# Patient Record
Sex: Male | Born: 1970 | Race: Black or African American | Hispanic: No | Marital: Single | Smoking: Former smoker
Health system: Southern US, Community
[De-identification: ages and names within clinical notes are randomized; demographics above are authoritative.]

## PROBLEM LIST (undated history)

## (undated) DIAGNOSIS — I38 Endocarditis, valve unspecified: Secondary | ICD-10-CM

## (undated) DIAGNOSIS — I10 Essential (primary) hypertension: Secondary | ICD-10-CM

## (undated) DIAGNOSIS — G8929 Other chronic pain: Secondary | ICD-10-CM

## (undated) DIAGNOSIS — N19 Unspecified kidney failure: Secondary | ICD-10-CM

## (undated) DIAGNOSIS — I639 Cerebral infarction, unspecified: Secondary | ICD-10-CM

## (undated) DIAGNOSIS — S83206A Unspecified tear of unspecified meniscus, current injury, right knee, initial encounter: Secondary | ICD-10-CM

## (undated) DIAGNOSIS — K219 Gastro-esophageal reflux disease without esophagitis: Secondary | ICD-10-CM

## (undated) DIAGNOSIS — Z992 Dependence on renal dialysis: Secondary | ICD-10-CM

## (undated) DIAGNOSIS — M549 Dorsalgia, unspecified: Secondary | ICD-10-CM

## (undated) DIAGNOSIS — F419 Anxiety disorder, unspecified: Secondary | ICD-10-CM

## (undated) DIAGNOSIS — R569 Unspecified convulsions: Secondary | ICD-10-CM

## (undated) DIAGNOSIS — S22009A Unspecified fracture of unspecified thoracic vertebra, initial encounter for closed fracture: Secondary | ICD-10-CM

## (undated) DIAGNOSIS — S83207A Unspecified tear of unspecified meniscus, current injury, left knee, initial encounter: Secondary | ICD-10-CM

## (undated) DIAGNOSIS — G4730 Sleep apnea, unspecified: Secondary | ICD-10-CM

## (undated) HISTORY — DX: Essential (primary) hypertension: I10

## (undated) HISTORY — PX: OTHER SURGICAL HISTORY: SHX169

## (undated) HISTORY — DX: Unspecified fracture of unspecified thoracic vertebra, initial encounter for closed fracture: S22.009A

## (undated) HISTORY — DX: Unspecified convulsions: R56.9

## (undated) HISTORY — DX: Sleep apnea, unspecified: G47.30

## (undated) HISTORY — PX: AV FISTULA REPAIR: SHX563

## (undated) HISTORY — PX: AV FISTULA PLACEMENT: SHX1204

---

## 2006-09-02 ENCOUNTER — Other Ambulatory Visit: Payer: Self-pay

## 2006-09-02 ENCOUNTER — Inpatient Hospital Stay: Payer: Self-pay | Admitting: Internal Medicine

## 2009-04-05 ENCOUNTER — Inpatient Hospital Stay: Payer: Self-pay | Admitting: Internal Medicine

## 2009-04-05 ENCOUNTER — Ambulatory Visit: Payer: Self-pay | Admitting: Cardiovascular Disease

## 2009-05-28 ENCOUNTER — Ambulatory Visit: Payer: Self-pay | Admitting: Vascular Surgery

## 2009-06-04 ENCOUNTER — Ambulatory Visit: Payer: Self-pay | Admitting: Vascular Surgery

## 2009-06-25 ENCOUNTER — Emergency Department: Payer: Self-pay | Admitting: Emergency Medicine

## 2009-10-15 ENCOUNTER — Ambulatory Visit: Payer: Self-pay | Admitting: Internal Medicine

## 2011-01-18 DIAGNOSIS — N186 End stage renal disease: Secondary | ICD-10-CM | POA: Diagnosis not present

## 2011-01-19 DIAGNOSIS — N186 End stage renal disease: Secondary | ICD-10-CM | POA: Diagnosis not present

## 2011-01-19 DIAGNOSIS — D509 Iron deficiency anemia, unspecified: Secondary | ICD-10-CM | POA: Diagnosis not present

## 2011-01-19 DIAGNOSIS — N2581 Secondary hyperparathyroidism of renal origin: Secondary | ICD-10-CM | POA: Diagnosis not present

## 2011-01-19 DIAGNOSIS — Z992 Dependence on renal dialysis: Secondary | ICD-10-CM | POA: Diagnosis not present

## 2011-01-21 DIAGNOSIS — D509 Iron deficiency anemia, unspecified: Secondary | ICD-10-CM | POA: Diagnosis not present

## 2011-01-21 DIAGNOSIS — Z992 Dependence on renal dialysis: Secondary | ICD-10-CM | POA: Diagnosis not present

## 2011-01-21 DIAGNOSIS — N186 End stage renal disease: Secondary | ICD-10-CM | POA: Diagnosis not present

## 2011-01-21 DIAGNOSIS — N2581 Secondary hyperparathyroidism of renal origin: Secondary | ICD-10-CM | POA: Diagnosis not present

## 2011-01-24 DIAGNOSIS — D509 Iron deficiency anemia, unspecified: Secondary | ICD-10-CM | POA: Diagnosis not present

## 2011-01-24 DIAGNOSIS — N186 End stage renal disease: Secondary | ICD-10-CM | POA: Diagnosis not present

## 2011-01-24 DIAGNOSIS — Z992 Dependence on renal dialysis: Secondary | ICD-10-CM | POA: Diagnosis not present

## 2011-01-24 DIAGNOSIS — N2581 Secondary hyperparathyroidism of renal origin: Secondary | ICD-10-CM | POA: Diagnosis not present

## 2011-01-26 DIAGNOSIS — Z992 Dependence on renal dialysis: Secondary | ICD-10-CM | POA: Diagnosis not present

## 2011-01-26 DIAGNOSIS — D509 Iron deficiency anemia, unspecified: Secondary | ICD-10-CM | POA: Diagnosis not present

## 2011-01-26 DIAGNOSIS — N2581 Secondary hyperparathyroidism of renal origin: Secondary | ICD-10-CM | POA: Diagnosis not present

## 2011-01-26 DIAGNOSIS — N186 End stage renal disease: Secondary | ICD-10-CM | POA: Diagnosis not present

## 2011-01-28 DIAGNOSIS — N2581 Secondary hyperparathyroidism of renal origin: Secondary | ICD-10-CM | POA: Diagnosis not present

## 2011-01-28 DIAGNOSIS — N186 End stage renal disease: Secondary | ICD-10-CM | POA: Diagnosis not present

## 2011-01-28 DIAGNOSIS — Z992 Dependence on renal dialysis: Secondary | ICD-10-CM | POA: Diagnosis not present

## 2011-01-28 DIAGNOSIS — D509 Iron deficiency anemia, unspecified: Secondary | ICD-10-CM | POA: Diagnosis not present

## 2011-01-31 DIAGNOSIS — Z992 Dependence on renal dialysis: Secondary | ICD-10-CM | POA: Diagnosis not present

## 2011-01-31 DIAGNOSIS — D509 Iron deficiency anemia, unspecified: Secondary | ICD-10-CM | POA: Diagnosis not present

## 2011-01-31 DIAGNOSIS — N2581 Secondary hyperparathyroidism of renal origin: Secondary | ICD-10-CM | POA: Diagnosis not present

## 2011-01-31 DIAGNOSIS — N186 End stage renal disease: Secondary | ICD-10-CM | POA: Diagnosis not present

## 2011-02-02 DIAGNOSIS — N186 End stage renal disease: Secondary | ICD-10-CM | POA: Diagnosis not present

## 2011-02-02 DIAGNOSIS — N2581 Secondary hyperparathyroidism of renal origin: Secondary | ICD-10-CM | POA: Diagnosis not present

## 2011-02-02 DIAGNOSIS — Z992 Dependence on renal dialysis: Secondary | ICD-10-CM | POA: Diagnosis not present

## 2011-02-02 DIAGNOSIS — D509 Iron deficiency anemia, unspecified: Secondary | ICD-10-CM | POA: Diagnosis not present

## 2011-02-04 DIAGNOSIS — N186 End stage renal disease: Secondary | ICD-10-CM | POA: Diagnosis not present

## 2011-02-04 DIAGNOSIS — Z992 Dependence on renal dialysis: Secondary | ICD-10-CM | POA: Diagnosis not present

## 2011-02-04 DIAGNOSIS — N2581 Secondary hyperparathyroidism of renal origin: Secondary | ICD-10-CM | POA: Diagnosis not present

## 2011-02-04 DIAGNOSIS — D509 Iron deficiency anemia, unspecified: Secondary | ICD-10-CM | POA: Diagnosis not present

## 2011-02-07 DIAGNOSIS — N2581 Secondary hyperparathyroidism of renal origin: Secondary | ICD-10-CM | POA: Diagnosis not present

## 2011-02-07 DIAGNOSIS — N186 End stage renal disease: Secondary | ICD-10-CM | POA: Diagnosis not present

## 2011-02-07 DIAGNOSIS — D509 Iron deficiency anemia, unspecified: Secondary | ICD-10-CM | POA: Diagnosis not present

## 2011-02-07 DIAGNOSIS — Z992 Dependence on renal dialysis: Secondary | ICD-10-CM | POA: Diagnosis not present

## 2011-02-09 DIAGNOSIS — N2581 Secondary hyperparathyroidism of renal origin: Secondary | ICD-10-CM | POA: Diagnosis not present

## 2011-02-09 DIAGNOSIS — N186 End stage renal disease: Secondary | ICD-10-CM | POA: Diagnosis not present

## 2011-02-09 DIAGNOSIS — Z992 Dependence on renal dialysis: Secondary | ICD-10-CM | POA: Diagnosis not present

## 2011-02-09 DIAGNOSIS — D509 Iron deficiency anemia, unspecified: Secondary | ICD-10-CM | POA: Diagnosis not present

## 2011-02-11 DIAGNOSIS — N2581 Secondary hyperparathyroidism of renal origin: Secondary | ICD-10-CM | POA: Diagnosis not present

## 2011-02-11 DIAGNOSIS — D509 Iron deficiency anemia, unspecified: Secondary | ICD-10-CM | POA: Diagnosis not present

## 2011-02-11 DIAGNOSIS — N186 End stage renal disease: Secondary | ICD-10-CM | POA: Diagnosis not present

## 2011-02-11 DIAGNOSIS — Z992 Dependence on renal dialysis: Secondary | ICD-10-CM | POA: Diagnosis not present

## 2011-02-14 DIAGNOSIS — N2581 Secondary hyperparathyroidism of renal origin: Secondary | ICD-10-CM | POA: Diagnosis not present

## 2011-02-14 DIAGNOSIS — Z992 Dependence on renal dialysis: Secondary | ICD-10-CM | POA: Diagnosis not present

## 2011-02-14 DIAGNOSIS — D509 Iron deficiency anemia, unspecified: Secondary | ICD-10-CM | POA: Diagnosis not present

## 2011-02-14 DIAGNOSIS — N186 End stage renal disease: Secondary | ICD-10-CM | POA: Diagnosis not present

## 2011-02-16 DIAGNOSIS — Z992 Dependence on renal dialysis: Secondary | ICD-10-CM | POA: Diagnosis not present

## 2011-02-16 DIAGNOSIS — N186 End stage renal disease: Secondary | ICD-10-CM | POA: Diagnosis not present

## 2011-02-16 DIAGNOSIS — D509 Iron deficiency anemia, unspecified: Secondary | ICD-10-CM | POA: Diagnosis not present

## 2011-02-16 DIAGNOSIS — N2581 Secondary hyperparathyroidism of renal origin: Secondary | ICD-10-CM | POA: Diagnosis not present

## 2011-02-18 DIAGNOSIS — N186 End stage renal disease: Secondary | ICD-10-CM | POA: Diagnosis not present

## 2011-02-18 DIAGNOSIS — D631 Anemia in chronic kidney disease: Secondary | ICD-10-CM | POA: Diagnosis not present

## 2011-02-18 DIAGNOSIS — N2581 Secondary hyperparathyroidism of renal origin: Secondary | ICD-10-CM | POA: Diagnosis not present

## 2011-02-18 DIAGNOSIS — D509 Iron deficiency anemia, unspecified: Secondary | ICD-10-CM | POA: Diagnosis not present

## 2011-02-18 DIAGNOSIS — Z992 Dependence on renal dialysis: Secondary | ICD-10-CM | POA: Diagnosis not present

## 2011-02-18 DIAGNOSIS — N039 Chronic nephritic syndrome with unspecified morphologic changes: Secondary | ICD-10-CM | POA: Diagnosis not present

## 2011-02-21 DIAGNOSIS — D631 Anemia in chronic kidney disease: Secondary | ICD-10-CM | POA: Diagnosis not present

## 2011-02-21 DIAGNOSIS — N186 End stage renal disease: Secondary | ICD-10-CM | POA: Diagnosis not present

## 2011-02-21 DIAGNOSIS — D509 Iron deficiency anemia, unspecified: Secondary | ICD-10-CM | POA: Diagnosis not present

## 2011-02-21 DIAGNOSIS — Z992 Dependence on renal dialysis: Secondary | ICD-10-CM | POA: Diagnosis not present

## 2011-02-21 DIAGNOSIS — N2581 Secondary hyperparathyroidism of renal origin: Secondary | ICD-10-CM | POA: Diagnosis not present

## 2011-02-23 DIAGNOSIS — D509 Iron deficiency anemia, unspecified: Secondary | ICD-10-CM | POA: Diagnosis not present

## 2011-02-23 DIAGNOSIS — N186 End stage renal disease: Secondary | ICD-10-CM | POA: Diagnosis not present

## 2011-02-23 DIAGNOSIS — N039 Chronic nephritic syndrome with unspecified morphologic changes: Secondary | ICD-10-CM | POA: Diagnosis not present

## 2011-02-23 DIAGNOSIS — N2581 Secondary hyperparathyroidism of renal origin: Secondary | ICD-10-CM | POA: Diagnosis not present

## 2011-02-23 DIAGNOSIS — Z992 Dependence on renal dialysis: Secondary | ICD-10-CM | POA: Diagnosis not present

## 2011-02-25 DIAGNOSIS — D509 Iron deficiency anemia, unspecified: Secondary | ICD-10-CM | POA: Diagnosis not present

## 2011-02-25 DIAGNOSIS — D631 Anemia in chronic kidney disease: Secondary | ICD-10-CM | POA: Diagnosis not present

## 2011-02-25 DIAGNOSIS — N2581 Secondary hyperparathyroidism of renal origin: Secondary | ICD-10-CM | POA: Diagnosis not present

## 2011-02-25 DIAGNOSIS — Z992 Dependence on renal dialysis: Secondary | ICD-10-CM | POA: Diagnosis not present

## 2011-02-25 DIAGNOSIS — N186 End stage renal disease: Secondary | ICD-10-CM | POA: Diagnosis not present

## 2011-02-28 DIAGNOSIS — D631 Anemia in chronic kidney disease: Secondary | ICD-10-CM | POA: Diagnosis not present

## 2011-02-28 DIAGNOSIS — N186 End stage renal disease: Secondary | ICD-10-CM | POA: Diagnosis not present

## 2011-02-28 DIAGNOSIS — Z992 Dependence on renal dialysis: Secondary | ICD-10-CM | POA: Diagnosis not present

## 2011-02-28 DIAGNOSIS — N2581 Secondary hyperparathyroidism of renal origin: Secondary | ICD-10-CM | POA: Diagnosis not present

## 2011-02-28 DIAGNOSIS — D509 Iron deficiency anemia, unspecified: Secondary | ICD-10-CM | POA: Diagnosis not present

## 2011-03-02 DIAGNOSIS — N186 End stage renal disease: Secondary | ICD-10-CM | POA: Diagnosis not present

## 2011-03-02 DIAGNOSIS — N039 Chronic nephritic syndrome with unspecified morphologic changes: Secondary | ICD-10-CM | POA: Diagnosis not present

## 2011-03-02 DIAGNOSIS — N2581 Secondary hyperparathyroidism of renal origin: Secondary | ICD-10-CM | POA: Diagnosis not present

## 2011-03-02 DIAGNOSIS — Z992 Dependence on renal dialysis: Secondary | ICD-10-CM | POA: Diagnosis not present

## 2011-03-02 DIAGNOSIS — D509 Iron deficiency anemia, unspecified: Secondary | ICD-10-CM | POA: Diagnosis not present

## 2011-03-04 DIAGNOSIS — D509 Iron deficiency anemia, unspecified: Secondary | ICD-10-CM | POA: Diagnosis not present

## 2011-03-04 DIAGNOSIS — N2581 Secondary hyperparathyroidism of renal origin: Secondary | ICD-10-CM | POA: Diagnosis not present

## 2011-03-04 DIAGNOSIS — N186 End stage renal disease: Secondary | ICD-10-CM | POA: Diagnosis not present

## 2011-03-04 DIAGNOSIS — D631 Anemia in chronic kidney disease: Secondary | ICD-10-CM | POA: Diagnosis not present

## 2011-03-04 DIAGNOSIS — Z992 Dependence on renal dialysis: Secondary | ICD-10-CM | POA: Diagnosis not present

## 2011-03-07 DIAGNOSIS — N2581 Secondary hyperparathyroidism of renal origin: Secondary | ICD-10-CM | POA: Diagnosis not present

## 2011-03-07 DIAGNOSIS — D631 Anemia in chronic kidney disease: Secondary | ICD-10-CM | POA: Diagnosis not present

## 2011-03-07 DIAGNOSIS — N186 End stage renal disease: Secondary | ICD-10-CM | POA: Diagnosis not present

## 2011-03-07 DIAGNOSIS — Z992 Dependence on renal dialysis: Secondary | ICD-10-CM | POA: Diagnosis not present

## 2011-03-07 DIAGNOSIS — D509 Iron deficiency anemia, unspecified: Secondary | ICD-10-CM | POA: Diagnosis not present

## 2011-03-09 DIAGNOSIS — N186 End stage renal disease: Secondary | ICD-10-CM | POA: Diagnosis not present

## 2011-03-09 DIAGNOSIS — N2581 Secondary hyperparathyroidism of renal origin: Secondary | ICD-10-CM | POA: Diagnosis not present

## 2011-03-09 DIAGNOSIS — Z992 Dependence on renal dialysis: Secondary | ICD-10-CM | POA: Diagnosis not present

## 2011-03-09 DIAGNOSIS — D631 Anemia in chronic kidney disease: Secondary | ICD-10-CM | POA: Diagnosis not present

## 2011-03-09 DIAGNOSIS — D509 Iron deficiency anemia, unspecified: Secondary | ICD-10-CM | POA: Diagnosis not present

## 2011-03-11 DIAGNOSIS — Z992 Dependence on renal dialysis: Secondary | ICD-10-CM | POA: Diagnosis not present

## 2011-03-11 DIAGNOSIS — N2581 Secondary hyperparathyroidism of renal origin: Secondary | ICD-10-CM | POA: Diagnosis not present

## 2011-03-11 DIAGNOSIS — D509 Iron deficiency anemia, unspecified: Secondary | ICD-10-CM | POA: Diagnosis not present

## 2011-03-11 DIAGNOSIS — N186 End stage renal disease: Secondary | ICD-10-CM | POA: Diagnosis not present

## 2011-03-11 DIAGNOSIS — D631 Anemia in chronic kidney disease: Secondary | ICD-10-CM | POA: Diagnosis not present

## 2011-03-14 DIAGNOSIS — N186 End stage renal disease: Secondary | ICD-10-CM | POA: Diagnosis not present

## 2011-03-14 DIAGNOSIS — Z992 Dependence on renal dialysis: Secondary | ICD-10-CM | POA: Diagnosis not present

## 2011-03-14 DIAGNOSIS — D509 Iron deficiency anemia, unspecified: Secondary | ICD-10-CM | POA: Diagnosis not present

## 2011-03-14 DIAGNOSIS — D631 Anemia in chronic kidney disease: Secondary | ICD-10-CM | POA: Diagnosis not present

## 2011-03-14 DIAGNOSIS — N2581 Secondary hyperparathyroidism of renal origin: Secondary | ICD-10-CM | POA: Diagnosis not present

## 2011-03-16 DIAGNOSIS — N186 End stage renal disease: Secondary | ICD-10-CM | POA: Diagnosis not present

## 2011-03-16 DIAGNOSIS — E46 Unspecified protein-calorie malnutrition: Secondary | ICD-10-CM | POA: Diagnosis not present

## 2011-03-16 DIAGNOSIS — N2581 Secondary hyperparathyroidism of renal origin: Secondary | ICD-10-CM | POA: Diagnosis not present

## 2011-03-16 DIAGNOSIS — D631 Anemia in chronic kidney disease: Secondary | ICD-10-CM | POA: Diagnosis not present

## 2011-03-16 DIAGNOSIS — Z992 Dependence on renal dialysis: Secondary | ICD-10-CM | POA: Diagnosis not present

## 2011-03-16 DIAGNOSIS — D509 Iron deficiency anemia, unspecified: Secondary | ICD-10-CM | POA: Diagnosis not present

## 2011-03-18 DIAGNOSIS — D509 Iron deficiency anemia, unspecified: Secondary | ICD-10-CM | POA: Diagnosis not present

## 2011-03-18 DIAGNOSIS — Z992 Dependence on renal dialysis: Secondary | ICD-10-CM | POA: Diagnosis not present

## 2011-03-18 DIAGNOSIS — D631 Anemia in chronic kidney disease: Secondary | ICD-10-CM | POA: Diagnosis not present

## 2011-03-18 DIAGNOSIS — N186 End stage renal disease: Secondary | ICD-10-CM | POA: Diagnosis not present

## 2011-03-18 DIAGNOSIS — N2581 Secondary hyperparathyroidism of renal origin: Secondary | ICD-10-CM | POA: Diagnosis not present

## 2011-03-21 DIAGNOSIS — D509 Iron deficiency anemia, unspecified: Secondary | ICD-10-CM | POA: Diagnosis not present

## 2011-03-21 DIAGNOSIS — N039 Chronic nephritic syndrome with unspecified morphologic changes: Secondary | ICD-10-CM | POA: Diagnosis not present

## 2011-03-21 DIAGNOSIS — N186 End stage renal disease: Secondary | ICD-10-CM | POA: Diagnosis not present

## 2011-03-21 DIAGNOSIS — Z992 Dependence on renal dialysis: Secondary | ICD-10-CM | POA: Diagnosis not present

## 2011-03-21 DIAGNOSIS — N2581 Secondary hyperparathyroidism of renal origin: Secondary | ICD-10-CM | POA: Diagnosis not present

## 2011-03-23 DIAGNOSIS — N039 Chronic nephritic syndrome with unspecified morphologic changes: Secondary | ICD-10-CM | POA: Diagnosis not present

## 2011-03-23 DIAGNOSIS — D509 Iron deficiency anemia, unspecified: Secondary | ICD-10-CM | POA: Diagnosis not present

## 2011-03-23 DIAGNOSIS — Z992 Dependence on renal dialysis: Secondary | ICD-10-CM | POA: Diagnosis not present

## 2011-03-23 DIAGNOSIS — N2581 Secondary hyperparathyroidism of renal origin: Secondary | ICD-10-CM | POA: Diagnosis not present

## 2011-03-23 DIAGNOSIS — N186 End stage renal disease: Secondary | ICD-10-CM | POA: Diagnosis not present

## 2011-03-25 DIAGNOSIS — Z992 Dependence on renal dialysis: Secondary | ICD-10-CM | POA: Diagnosis not present

## 2011-03-25 DIAGNOSIS — N039 Chronic nephritic syndrome with unspecified morphologic changes: Secondary | ICD-10-CM | POA: Diagnosis not present

## 2011-03-25 DIAGNOSIS — N186 End stage renal disease: Secondary | ICD-10-CM | POA: Diagnosis not present

## 2011-03-25 DIAGNOSIS — N2581 Secondary hyperparathyroidism of renal origin: Secondary | ICD-10-CM | POA: Diagnosis not present

## 2011-03-25 DIAGNOSIS — D509 Iron deficiency anemia, unspecified: Secondary | ICD-10-CM | POA: Diagnosis not present

## 2011-03-28 DIAGNOSIS — N2581 Secondary hyperparathyroidism of renal origin: Secondary | ICD-10-CM | POA: Diagnosis not present

## 2011-03-28 DIAGNOSIS — N039 Chronic nephritic syndrome with unspecified morphologic changes: Secondary | ICD-10-CM | POA: Diagnosis not present

## 2011-03-28 DIAGNOSIS — D509 Iron deficiency anemia, unspecified: Secondary | ICD-10-CM | POA: Diagnosis not present

## 2011-03-28 DIAGNOSIS — N186 End stage renal disease: Secondary | ICD-10-CM | POA: Diagnosis not present

## 2011-03-28 DIAGNOSIS — Z992 Dependence on renal dialysis: Secondary | ICD-10-CM | POA: Diagnosis not present

## 2011-03-30 DIAGNOSIS — N2581 Secondary hyperparathyroidism of renal origin: Secondary | ICD-10-CM | POA: Diagnosis not present

## 2011-03-30 DIAGNOSIS — D631 Anemia in chronic kidney disease: Secondary | ICD-10-CM | POA: Diagnosis not present

## 2011-03-30 DIAGNOSIS — D509 Iron deficiency anemia, unspecified: Secondary | ICD-10-CM | POA: Diagnosis not present

## 2011-03-30 DIAGNOSIS — Z992 Dependence on renal dialysis: Secondary | ICD-10-CM | POA: Diagnosis not present

## 2011-03-30 DIAGNOSIS — N186 End stage renal disease: Secondary | ICD-10-CM | POA: Diagnosis not present

## 2011-04-01 DIAGNOSIS — D631 Anemia in chronic kidney disease: Secondary | ICD-10-CM | POA: Diagnosis not present

## 2011-04-01 DIAGNOSIS — Z992 Dependence on renal dialysis: Secondary | ICD-10-CM | POA: Diagnosis not present

## 2011-04-01 DIAGNOSIS — N2581 Secondary hyperparathyroidism of renal origin: Secondary | ICD-10-CM | POA: Diagnosis not present

## 2011-04-01 DIAGNOSIS — D509 Iron deficiency anemia, unspecified: Secondary | ICD-10-CM | POA: Diagnosis not present

## 2011-04-01 DIAGNOSIS — N186 End stage renal disease: Secondary | ICD-10-CM | POA: Diagnosis not present

## 2011-04-04 DIAGNOSIS — D631 Anemia in chronic kidney disease: Secondary | ICD-10-CM | POA: Diagnosis not present

## 2011-04-04 DIAGNOSIS — Z992 Dependence on renal dialysis: Secondary | ICD-10-CM | POA: Diagnosis not present

## 2011-04-04 DIAGNOSIS — N186 End stage renal disease: Secondary | ICD-10-CM | POA: Diagnosis not present

## 2011-04-04 DIAGNOSIS — N2581 Secondary hyperparathyroidism of renal origin: Secondary | ICD-10-CM | POA: Diagnosis not present

## 2011-04-04 DIAGNOSIS — D509 Iron deficiency anemia, unspecified: Secondary | ICD-10-CM | POA: Diagnosis not present

## 2011-04-06 DIAGNOSIS — D631 Anemia in chronic kidney disease: Secondary | ICD-10-CM | POA: Diagnosis not present

## 2011-04-06 DIAGNOSIS — Z992 Dependence on renal dialysis: Secondary | ICD-10-CM | POA: Diagnosis not present

## 2011-04-06 DIAGNOSIS — N186 End stage renal disease: Secondary | ICD-10-CM | POA: Diagnosis not present

## 2011-04-06 DIAGNOSIS — D509 Iron deficiency anemia, unspecified: Secondary | ICD-10-CM | POA: Diagnosis not present

## 2011-04-06 DIAGNOSIS — N2581 Secondary hyperparathyroidism of renal origin: Secondary | ICD-10-CM | POA: Diagnosis not present

## 2011-04-08 DIAGNOSIS — N186 End stage renal disease: Secondary | ICD-10-CM | POA: Diagnosis not present

## 2011-04-08 DIAGNOSIS — D509 Iron deficiency anemia, unspecified: Secondary | ICD-10-CM | POA: Diagnosis not present

## 2011-04-08 DIAGNOSIS — D631 Anemia in chronic kidney disease: Secondary | ICD-10-CM | POA: Diagnosis not present

## 2011-04-08 DIAGNOSIS — Z992 Dependence on renal dialysis: Secondary | ICD-10-CM | POA: Diagnosis not present

## 2011-04-08 DIAGNOSIS — N2581 Secondary hyperparathyroidism of renal origin: Secondary | ICD-10-CM | POA: Diagnosis not present

## 2011-04-11 DIAGNOSIS — N2581 Secondary hyperparathyroidism of renal origin: Secondary | ICD-10-CM | POA: Diagnosis not present

## 2011-04-11 DIAGNOSIS — N186 End stage renal disease: Secondary | ICD-10-CM | POA: Diagnosis not present

## 2011-04-11 DIAGNOSIS — D631 Anemia in chronic kidney disease: Secondary | ICD-10-CM | POA: Diagnosis not present

## 2011-04-11 DIAGNOSIS — D509 Iron deficiency anemia, unspecified: Secondary | ICD-10-CM | POA: Diagnosis not present

## 2011-04-11 DIAGNOSIS — Z992 Dependence on renal dialysis: Secondary | ICD-10-CM | POA: Diagnosis not present

## 2011-04-13 DIAGNOSIS — D631 Anemia in chronic kidney disease: Secondary | ICD-10-CM | POA: Diagnosis not present

## 2011-04-13 DIAGNOSIS — Z992 Dependence on renal dialysis: Secondary | ICD-10-CM | POA: Diagnosis not present

## 2011-04-13 DIAGNOSIS — D509 Iron deficiency anemia, unspecified: Secondary | ICD-10-CM | POA: Diagnosis not present

## 2011-04-13 DIAGNOSIS — N2581 Secondary hyperparathyroidism of renal origin: Secondary | ICD-10-CM | POA: Diagnosis not present

## 2011-04-13 DIAGNOSIS — N039 Chronic nephritic syndrome with unspecified morphologic changes: Secondary | ICD-10-CM | POA: Diagnosis not present

## 2011-04-13 DIAGNOSIS — N186 End stage renal disease: Secondary | ICD-10-CM | POA: Diagnosis not present

## 2011-04-13 DIAGNOSIS — E46 Unspecified protein-calorie malnutrition: Secondary | ICD-10-CM | POA: Diagnosis not present

## 2011-04-15 DIAGNOSIS — Z992 Dependence on renal dialysis: Secondary | ICD-10-CM | POA: Diagnosis not present

## 2011-04-15 DIAGNOSIS — D631 Anemia in chronic kidney disease: Secondary | ICD-10-CM | POA: Diagnosis not present

## 2011-04-15 DIAGNOSIS — N2581 Secondary hyperparathyroidism of renal origin: Secondary | ICD-10-CM | POA: Diagnosis not present

## 2011-04-15 DIAGNOSIS — N186 End stage renal disease: Secondary | ICD-10-CM | POA: Diagnosis not present

## 2011-04-15 DIAGNOSIS — D509 Iron deficiency anemia, unspecified: Secondary | ICD-10-CM | POA: Diagnosis not present

## 2011-04-18 DIAGNOSIS — N2581 Secondary hyperparathyroidism of renal origin: Secondary | ICD-10-CM | POA: Diagnosis not present

## 2011-04-18 DIAGNOSIS — D509 Iron deficiency anemia, unspecified: Secondary | ICD-10-CM | POA: Diagnosis not present

## 2011-04-18 DIAGNOSIS — N186 End stage renal disease: Secondary | ICD-10-CM | POA: Diagnosis not present

## 2011-04-21 DIAGNOSIS — T82898A Other specified complication of vascular prosthetic devices, implants and grafts, initial encounter: Secondary | ICD-10-CM | POA: Diagnosis not present

## 2011-04-28 ENCOUNTER — Ambulatory Visit: Payer: Self-pay | Admitting: Vascular Surgery

## 2011-04-28 DIAGNOSIS — T82898A Other specified complication of vascular prosthetic devices, implants and grafts, initial encounter: Secondary | ICD-10-CM | POA: Diagnosis not present

## 2011-04-28 DIAGNOSIS — I120 Hypertensive chronic kidney disease with stage 5 chronic kidney disease or end stage renal disease: Secondary | ICD-10-CM | POA: Diagnosis not present

## 2011-04-28 DIAGNOSIS — I10 Essential (primary) hypertension: Secondary | ICD-10-CM | POA: Diagnosis not present

## 2011-04-28 DIAGNOSIS — Z992 Dependence on renal dialysis: Secondary | ICD-10-CM | POA: Diagnosis not present

## 2011-04-28 DIAGNOSIS — N186 End stage renal disease: Secondary | ICD-10-CM | POA: Diagnosis not present

## 2011-05-18 DIAGNOSIS — Z992 Dependence on renal dialysis: Secondary | ICD-10-CM | POA: Diagnosis not present

## 2011-05-18 DIAGNOSIS — N2581 Secondary hyperparathyroidism of renal origin: Secondary | ICD-10-CM | POA: Diagnosis not present

## 2011-05-18 DIAGNOSIS — N186 End stage renal disease: Secondary | ICD-10-CM | POA: Diagnosis not present

## 2011-05-18 DIAGNOSIS — D509 Iron deficiency anemia, unspecified: Secondary | ICD-10-CM | POA: Diagnosis not present

## 2011-05-18 DIAGNOSIS — E8779 Other fluid overload: Secondary | ICD-10-CM | POA: Diagnosis not present

## 2011-05-19 DIAGNOSIS — N186 End stage renal disease: Secondary | ICD-10-CM | POA: Diagnosis not present

## 2011-05-19 DIAGNOSIS — N2581 Secondary hyperparathyroidism of renal origin: Secondary | ICD-10-CM | POA: Diagnosis not present

## 2011-05-19 DIAGNOSIS — E8779 Other fluid overload: Secondary | ICD-10-CM | POA: Diagnosis not present

## 2011-05-19 DIAGNOSIS — D509 Iron deficiency anemia, unspecified: Secondary | ICD-10-CM | POA: Diagnosis not present

## 2011-05-19 DIAGNOSIS — Z992 Dependence on renal dialysis: Secondary | ICD-10-CM | POA: Diagnosis not present

## 2011-05-20 DIAGNOSIS — N2581 Secondary hyperparathyroidism of renal origin: Secondary | ICD-10-CM | POA: Diagnosis not present

## 2011-05-20 DIAGNOSIS — Z992 Dependence on renal dialysis: Secondary | ICD-10-CM | POA: Diagnosis not present

## 2011-05-20 DIAGNOSIS — N186 End stage renal disease: Secondary | ICD-10-CM | POA: Diagnosis not present

## 2011-05-20 DIAGNOSIS — E8779 Other fluid overload: Secondary | ICD-10-CM | POA: Diagnosis not present

## 2011-05-20 DIAGNOSIS — D509 Iron deficiency anemia, unspecified: Secondary | ICD-10-CM | POA: Diagnosis not present

## 2011-05-23 DIAGNOSIS — D509 Iron deficiency anemia, unspecified: Secondary | ICD-10-CM | POA: Diagnosis not present

## 2011-05-23 DIAGNOSIS — N186 End stage renal disease: Secondary | ICD-10-CM | POA: Diagnosis not present

## 2011-05-23 DIAGNOSIS — Z992 Dependence on renal dialysis: Secondary | ICD-10-CM | POA: Diagnosis not present

## 2011-05-23 DIAGNOSIS — E8779 Other fluid overload: Secondary | ICD-10-CM | POA: Diagnosis not present

## 2011-05-23 DIAGNOSIS — N2581 Secondary hyperparathyroidism of renal origin: Secondary | ICD-10-CM | POA: Diagnosis not present

## 2011-05-25 DIAGNOSIS — Z992 Dependence on renal dialysis: Secondary | ICD-10-CM | POA: Diagnosis not present

## 2011-05-25 DIAGNOSIS — E8779 Other fluid overload: Secondary | ICD-10-CM | POA: Diagnosis not present

## 2011-05-25 DIAGNOSIS — D509 Iron deficiency anemia, unspecified: Secondary | ICD-10-CM | POA: Diagnosis not present

## 2011-05-25 DIAGNOSIS — N2581 Secondary hyperparathyroidism of renal origin: Secondary | ICD-10-CM | POA: Diagnosis not present

## 2011-05-25 DIAGNOSIS — N186 End stage renal disease: Secondary | ICD-10-CM | POA: Diagnosis not present

## 2011-05-27 DIAGNOSIS — N2581 Secondary hyperparathyroidism of renal origin: Secondary | ICD-10-CM | POA: Diagnosis not present

## 2011-05-27 DIAGNOSIS — E8779 Other fluid overload: Secondary | ICD-10-CM | POA: Diagnosis not present

## 2011-05-27 DIAGNOSIS — Z992 Dependence on renal dialysis: Secondary | ICD-10-CM | POA: Diagnosis not present

## 2011-05-27 DIAGNOSIS — N186 End stage renal disease: Secondary | ICD-10-CM | POA: Diagnosis not present

## 2011-05-27 DIAGNOSIS — D509 Iron deficiency anemia, unspecified: Secondary | ICD-10-CM | POA: Diagnosis not present

## 2011-05-30 DIAGNOSIS — N2581 Secondary hyperparathyroidism of renal origin: Secondary | ICD-10-CM | POA: Diagnosis not present

## 2011-05-30 DIAGNOSIS — D509 Iron deficiency anemia, unspecified: Secondary | ICD-10-CM | POA: Diagnosis not present

## 2011-05-30 DIAGNOSIS — E8779 Other fluid overload: Secondary | ICD-10-CM | POA: Diagnosis not present

## 2011-05-30 DIAGNOSIS — Z992 Dependence on renal dialysis: Secondary | ICD-10-CM | POA: Diagnosis not present

## 2011-05-30 DIAGNOSIS — N186 End stage renal disease: Secondary | ICD-10-CM | POA: Diagnosis not present

## 2011-06-01 DIAGNOSIS — D509 Iron deficiency anemia, unspecified: Secondary | ICD-10-CM | POA: Diagnosis not present

## 2011-06-01 DIAGNOSIS — Z992 Dependence on renal dialysis: Secondary | ICD-10-CM | POA: Diagnosis not present

## 2011-06-01 DIAGNOSIS — E8779 Other fluid overload: Secondary | ICD-10-CM | POA: Diagnosis not present

## 2011-06-01 DIAGNOSIS — N186 End stage renal disease: Secondary | ICD-10-CM | POA: Diagnosis not present

## 2011-06-01 DIAGNOSIS — N2581 Secondary hyperparathyroidism of renal origin: Secondary | ICD-10-CM | POA: Diagnosis not present

## 2011-06-03 DIAGNOSIS — E8779 Other fluid overload: Secondary | ICD-10-CM | POA: Diagnosis not present

## 2011-06-03 DIAGNOSIS — Z992 Dependence on renal dialysis: Secondary | ICD-10-CM | POA: Diagnosis not present

## 2011-06-03 DIAGNOSIS — D509 Iron deficiency anemia, unspecified: Secondary | ICD-10-CM | POA: Diagnosis not present

## 2011-06-03 DIAGNOSIS — N186 End stage renal disease: Secondary | ICD-10-CM | POA: Diagnosis not present

## 2011-06-03 DIAGNOSIS — N2581 Secondary hyperparathyroidism of renal origin: Secondary | ICD-10-CM | POA: Diagnosis not present

## 2011-06-06 DIAGNOSIS — N186 End stage renal disease: Secondary | ICD-10-CM | POA: Diagnosis not present

## 2011-06-06 DIAGNOSIS — Z992 Dependence on renal dialysis: Secondary | ICD-10-CM | POA: Diagnosis not present

## 2011-06-06 DIAGNOSIS — E8779 Other fluid overload: Secondary | ICD-10-CM | POA: Diagnosis not present

## 2011-06-06 DIAGNOSIS — D509 Iron deficiency anemia, unspecified: Secondary | ICD-10-CM | POA: Diagnosis not present

## 2011-06-06 DIAGNOSIS — N2581 Secondary hyperparathyroidism of renal origin: Secondary | ICD-10-CM | POA: Diagnosis not present

## 2011-06-08 DIAGNOSIS — D509 Iron deficiency anemia, unspecified: Secondary | ICD-10-CM | POA: Diagnosis not present

## 2011-06-08 DIAGNOSIS — N186 End stage renal disease: Secondary | ICD-10-CM | POA: Diagnosis not present

## 2011-06-08 DIAGNOSIS — N2581 Secondary hyperparathyroidism of renal origin: Secondary | ICD-10-CM | POA: Diagnosis not present

## 2011-06-08 DIAGNOSIS — E8779 Other fluid overload: Secondary | ICD-10-CM | POA: Diagnosis not present

## 2011-06-08 DIAGNOSIS — Z992 Dependence on renal dialysis: Secondary | ICD-10-CM | POA: Diagnosis not present

## 2011-06-10 DIAGNOSIS — N186 End stage renal disease: Secondary | ICD-10-CM | POA: Diagnosis not present

## 2011-06-10 DIAGNOSIS — N2581 Secondary hyperparathyroidism of renal origin: Secondary | ICD-10-CM | POA: Diagnosis not present

## 2011-06-14 DIAGNOSIS — N2581 Secondary hyperparathyroidism of renal origin: Secondary | ICD-10-CM | POA: Diagnosis not present

## 2011-06-14 DIAGNOSIS — N186 End stage renal disease: Secondary | ICD-10-CM | POA: Diagnosis not present

## 2011-06-15 DIAGNOSIS — N186 End stage renal disease: Secondary | ICD-10-CM | POA: Diagnosis not present

## 2011-06-15 DIAGNOSIS — N2581 Secondary hyperparathyroidism of renal origin: Secondary | ICD-10-CM | POA: Diagnosis not present

## 2011-06-17 DIAGNOSIS — N186 End stage renal disease: Secondary | ICD-10-CM | POA: Diagnosis not present

## 2011-06-17 DIAGNOSIS — N2581 Secondary hyperparathyroidism of renal origin: Secondary | ICD-10-CM | POA: Diagnosis not present

## 2011-06-18 DIAGNOSIS — N186 End stage renal disease: Secondary | ICD-10-CM | POA: Diagnosis not present

## 2011-06-20 DIAGNOSIS — Z992 Dependence on renal dialysis: Secondary | ICD-10-CM | POA: Diagnosis not present

## 2011-06-20 DIAGNOSIS — D509 Iron deficiency anemia, unspecified: Secondary | ICD-10-CM | POA: Diagnosis not present

## 2011-06-20 DIAGNOSIS — N2581 Secondary hyperparathyroidism of renal origin: Secondary | ICD-10-CM | POA: Diagnosis not present

## 2011-06-20 DIAGNOSIS — N186 End stage renal disease: Secondary | ICD-10-CM | POA: Diagnosis not present

## 2011-06-22 DIAGNOSIS — N2581 Secondary hyperparathyroidism of renal origin: Secondary | ICD-10-CM | POA: Diagnosis not present

## 2011-06-22 DIAGNOSIS — D509 Iron deficiency anemia, unspecified: Secondary | ICD-10-CM | POA: Diagnosis not present

## 2011-06-22 DIAGNOSIS — N186 End stage renal disease: Secondary | ICD-10-CM | POA: Diagnosis not present

## 2011-06-22 DIAGNOSIS — Z992 Dependence on renal dialysis: Secondary | ICD-10-CM | POA: Diagnosis not present

## 2011-06-24 DIAGNOSIS — D509 Iron deficiency anemia, unspecified: Secondary | ICD-10-CM | POA: Diagnosis not present

## 2011-06-24 DIAGNOSIS — N186 End stage renal disease: Secondary | ICD-10-CM | POA: Diagnosis not present

## 2011-06-24 DIAGNOSIS — N2581 Secondary hyperparathyroidism of renal origin: Secondary | ICD-10-CM | POA: Diagnosis not present

## 2011-06-24 DIAGNOSIS — Z992 Dependence on renal dialysis: Secondary | ICD-10-CM | POA: Diagnosis not present

## 2011-06-27 DIAGNOSIS — Z992 Dependence on renal dialysis: Secondary | ICD-10-CM | POA: Diagnosis not present

## 2011-06-27 DIAGNOSIS — N2581 Secondary hyperparathyroidism of renal origin: Secondary | ICD-10-CM | POA: Diagnosis not present

## 2011-06-27 DIAGNOSIS — D509 Iron deficiency anemia, unspecified: Secondary | ICD-10-CM | POA: Diagnosis not present

## 2011-06-27 DIAGNOSIS — N186 End stage renal disease: Secondary | ICD-10-CM | POA: Diagnosis not present

## 2011-06-29 DIAGNOSIS — N2581 Secondary hyperparathyroidism of renal origin: Secondary | ICD-10-CM | POA: Diagnosis not present

## 2011-06-29 DIAGNOSIS — D509 Iron deficiency anemia, unspecified: Secondary | ICD-10-CM | POA: Diagnosis not present

## 2011-06-29 DIAGNOSIS — N186 End stage renal disease: Secondary | ICD-10-CM | POA: Diagnosis not present

## 2011-06-29 DIAGNOSIS — Z992 Dependence on renal dialysis: Secondary | ICD-10-CM | POA: Diagnosis not present

## 2011-07-01 DIAGNOSIS — N186 End stage renal disease: Secondary | ICD-10-CM | POA: Diagnosis not present

## 2011-07-01 DIAGNOSIS — D509 Iron deficiency anemia, unspecified: Secondary | ICD-10-CM | POA: Diagnosis not present

## 2011-07-01 DIAGNOSIS — Z992 Dependence on renal dialysis: Secondary | ICD-10-CM | POA: Diagnosis not present

## 2011-07-01 DIAGNOSIS — N2581 Secondary hyperparathyroidism of renal origin: Secondary | ICD-10-CM | POA: Diagnosis not present

## 2011-07-04 DIAGNOSIS — N186 End stage renal disease: Secondary | ICD-10-CM | POA: Diagnosis not present

## 2011-07-04 DIAGNOSIS — D509 Iron deficiency anemia, unspecified: Secondary | ICD-10-CM | POA: Diagnosis not present

## 2011-07-04 DIAGNOSIS — Z992 Dependence on renal dialysis: Secondary | ICD-10-CM | POA: Diagnosis not present

## 2011-07-04 DIAGNOSIS — N2581 Secondary hyperparathyroidism of renal origin: Secondary | ICD-10-CM | POA: Diagnosis not present

## 2011-07-06 DIAGNOSIS — N186 End stage renal disease: Secondary | ICD-10-CM | POA: Diagnosis not present

## 2011-07-06 DIAGNOSIS — Z992 Dependence on renal dialysis: Secondary | ICD-10-CM | POA: Diagnosis not present

## 2011-07-06 DIAGNOSIS — D509 Iron deficiency anemia, unspecified: Secondary | ICD-10-CM | POA: Diagnosis not present

## 2011-07-06 DIAGNOSIS — N2581 Secondary hyperparathyroidism of renal origin: Secondary | ICD-10-CM | POA: Diagnosis not present

## 2011-07-07 DIAGNOSIS — N186 End stage renal disease: Secondary | ICD-10-CM | POA: Diagnosis not present

## 2011-07-07 DIAGNOSIS — T82898A Other specified complication of vascular prosthetic devices, implants and grafts, initial encounter: Secondary | ICD-10-CM | POA: Diagnosis not present

## 2011-07-08 DIAGNOSIS — N186 End stage renal disease: Secondary | ICD-10-CM | POA: Diagnosis not present

## 2011-07-08 DIAGNOSIS — Z992 Dependence on renal dialysis: Secondary | ICD-10-CM | POA: Diagnosis not present

## 2011-07-08 DIAGNOSIS — D509 Iron deficiency anemia, unspecified: Secondary | ICD-10-CM | POA: Diagnosis not present

## 2011-07-08 DIAGNOSIS — N2581 Secondary hyperparathyroidism of renal origin: Secondary | ICD-10-CM | POA: Diagnosis not present

## 2011-07-11 DIAGNOSIS — D509 Iron deficiency anemia, unspecified: Secondary | ICD-10-CM | POA: Diagnosis not present

## 2011-07-11 DIAGNOSIS — Z992 Dependence on renal dialysis: Secondary | ICD-10-CM | POA: Diagnosis not present

## 2011-07-11 DIAGNOSIS — N186 End stage renal disease: Secondary | ICD-10-CM | POA: Diagnosis not present

## 2011-07-11 DIAGNOSIS — N2581 Secondary hyperparathyroidism of renal origin: Secondary | ICD-10-CM | POA: Diagnosis not present

## 2011-07-12 DIAGNOSIS — I120 Hypertensive chronic kidney disease with stage 5 chronic kidney disease or end stage renal disease: Secondary | ICD-10-CM | POA: Diagnosis not present

## 2011-07-12 DIAGNOSIS — Z87891 Personal history of nicotine dependence: Secondary | ICD-10-CM | POA: Diagnosis not present

## 2011-07-12 DIAGNOSIS — I10 Essential (primary) hypertension: Secondary | ICD-10-CM | POA: Diagnosis not present

## 2011-07-12 DIAGNOSIS — F411 Generalized anxiety disorder: Secondary | ICD-10-CM | POA: Diagnosis not present

## 2011-07-13 DIAGNOSIS — N186 End stage renal disease: Secondary | ICD-10-CM | POA: Diagnosis not present

## 2011-07-13 DIAGNOSIS — N2581 Secondary hyperparathyroidism of renal origin: Secondary | ICD-10-CM | POA: Diagnosis not present

## 2011-07-13 DIAGNOSIS — Z992 Dependence on renal dialysis: Secondary | ICD-10-CM | POA: Diagnosis not present

## 2011-07-13 DIAGNOSIS — D509 Iron deficiency anemia, unspecified: Secondary | ICD-10-CM | POA: Diagnosis not present

## 2011-07-15 DIAGNOSIS — D509 Iron deficiency anemia, unspecified: Secondary | ICD-10-CM | POA: Diagnosis not present

## 2011-07-15 DIAGNOSIS — N186 End stage renal disease: Secondary | ICD-10-CM | POA: Diagnosis not present

## 2011-07-15 DIAGNOSIS — N2581 Secondary hyperparathyroidism of renal origin: Secondary | ICD-10-CM | POA: Diagnosis not present

## 2011-07-15 DIAGNOSIS — Z992 Dependence on renal dialysis: Secondary | ICD-10-CM | POA: Diagnosis not present

## 2011-07-18 DIAGNOSIS — Z992 Dependence on renal dialysis: Secondary | ICD-10-CM | POA: Diagnosis not present

## 2011-07-18 DIAGNOSIS — D509 Iron deficiency anemia, unspecified: Secondary | ICD-10-CM | POA: Diagnosis not present

## 2011-07-18 DIAGNOSIS — N2581 Secondary hyperparathyroidism of renal origin: Secondary | ICD-10-CM | POA: Diagnosis not present

## 2011-07-18 DIAGNOSIS — Z23 Encounter for immunization: Secondary | ICD-10-CM | POA: Diagnosis not present

## 2011-07-18 DIAGNOSIS — N186 End stage renal disease: Secondary | ICD-10-CM | POA: Diagnosis not present

## 2011-07-20 DIAGNOSIS — Z23 Encounter for immunization: Secondary | ICD-10-CM | POA: Diagnosis not present

## 2011-07-20 DIAGNOSIS — N2581 Secondary hyperparathyroidism of renal origin: Secondary | ICD-10-CM | POA: Diagnosis not present

## 2011-07-20 DIAGNOSIS — N186 End stage renal disease: Secondary | ICD-10-CM | POA: Diagnosis not present

## 2011-07-20 DIAGNOSIS — Z992 Dependence on renal dialysis: Secondary | ICD-10-CM | POA: Diagnosis not present

## 2011-07-20 DIAGNOSIS — D509 Iron deficiency anemia, unspecified: Secondary | ICD-10-CM | POA: Diagnosis not present

## 2011-07-22 DIAGNOSIS — Z23 Encounter for immunization: Secondary | ICD-10-CM | POA: Diagnosis not present

## 2011-07-22 DIAGNOSIS — N186 End stage renal disease: Secondary | ICD-10-CM | POA: Diagnosis not present

## 2011-07-22 DIAGNOSIS — Z992 Dependence on renal dialysis: Secondary | ICD-10-CM | POA: Diagnosis not present

## 2011-07-22 DIAGNOSIS — D509 Iron deficiency anemia, unspecified: Secondary | ICD-10-CM | POA: Diagnosis not present

## 2011-07-22 DIAGNOSIS — N2581 Secondary hyperparathyroidism of renal origin: Secondary | ICD-10-CM | POA: Diagnosis not present

## 2011-07-25 DIAGNOSIS — Z23 Encounter for immunization: Secondary | ICD-10-CM | POA: Diagnosis not present

## 2011-07-25 DIAGNOSIS — Z992 Dependence on renal dialysis: Secondary | ICD-10-CM | POA: Diagnosis not present

## 2011-07-25 DIAGNOSIS — N2581 Secondary hyperparathyroidism of renal origin: Secondary | ICD-10-CM | POA: Diagnosis not present

## 2011-07-25 DIAGNOSIS — N186 End stage renal disease: Secondary | ICD-10-CM | POA: Diagnosis not present

## 2011-07-25 DIAGNOSIS — D509 Iron deficiency anemia, unspecified: Secondary | ICD-10-CM | POA: Diagnosis not present

## 2011-07-27 DIAGNOSIS — Z992 Dependence on renal dialysis: Secondary | ICD-10-CM | POA: Diagnosis not present

## 2011-07-27 DIAGNOSIS — Z23 Encounter for immunization: Secondary | ICD-10-CM | POA: Diagnosis not present

## 2011-07-27 DIAGNOSIS — N186 End stage renal disease: Secondary | ICD-10-CM | POA: Diagnosis not present

## 2011-07-27 DIAGNOSIS — D509 Iron deficiency anemia, unspecified: Secondary | ICD-10-CM | POA: Diagnosis not present

## 2011-07-27 DIAGNOSIS — N2581 Secondary hyperparathyroidism of renal origin: Secondary | ICD-10-CM | POA: Diagnosis not present

## 2011-07-29 DIAGNOSIS — Z23 Encounter for immunization: Secondary | ICD-10-CM | POA: Diagnosis not present

## 2011-07-29 DIAGNOSIS — N186 End stage renal disease: Secondary | ICD-10-CM | POA: Diagnosis not present

## 2011-07-29 DIAGNOSIS — N2581 Secondary hyperparathyroidism of renal origin: Secondary | ICD-10-CM | POA: Diagnosis not present

## 2011-07-29 DIAGNOSIS — Z992 Dependence on renal dialysis: Secondary | ICD-10-CM | POA: Diagnosis not present

## 2011-07-29 DIAGNOSIS — D509 Iron deficiency anemia, unspecified: Secondary | ICD-10-CM | POA: Diagnosis not present

## 2011-08-01 DIAGNOSIS — D509 Iron deficiency anemia, unspecified: Secondary | ICD-10-CM | POA: Diagnosis not present

## 2011-08-01 DIAGNOSIS — Z23 Encounter for immunization: Secondary | ICD-10-CM | POA: Diagnosis not present

## 2011-08-01 DIAGNOSIS — N2581 Secondary hyperparathyroidism of renal origin: Secondary | ICD-10-CM | POA: Diagnosis not present

## 2011-08-01 DIAGNOSIS — N186 End stage renal disease: Secondary | ICD-10-CM | POA: Diagnosis not present

## 2011-08-01 DIAGNOSIS — Z992 Dependence on renal dialysis: Secondary | ICD-10-CM | POA: Diagnosis not present

## 2011-08-03 DIAGNOSIS — N2581 Secondary hyperparathyroidism of renal origin: Secondary | ICD-10-CM | POA: Diagnosis not present

## 2011-08-03 DIAGNOSIS — Z992 Dependence on renal dialysis: Secondary | ICD-10-CM | POA: Diagnosis not present

## 2011-08-03 DIAGNOSIS — D509 Iron deficiency anemia, unspecified: Secondary | ICD-10-CM | POA: Diagnosis not present

## 2011-08-03 DIAGNOSIS — N186 End stage renal disease: Secondary | ICD-10-CM | POA: Diagnosis not present

## 2011-08-03 DIAGNOSIS — Z23 Encounter for immunization: Secondary | ICD-10-CM | POA: Diagnosis not present

## 2011-08-05 DIAGNOSIS — D509 Iron deficiency anemia, unspecified: Secondary | ICD-10-CM | POA: Diagnosis not present

## 2011-08-05 DIAGNOSIS — Z23 Encounter for immunization: Secondary | ICD-10-CM | POA: Diagnosis not present

## 2011-08-05 DIAGNOSIS — N186 End stage renal disease: Secondary | ICD-10-CM | POA: Diagnosis not present

## 2011-08-05 DIAGNOSIS — Z992 Dependence on renal dialysis: Secondary | ICD-10-CM | POA: Diagnosis not present

## 2011-08-05 DIAGNOSIS — N2581 Secondary hyperparathyroidism of renal origin: Secondary | ICD-10-CM | POA: Diagnosis not present

## 2011-08-08 DIAGNOSIS — D509 Iron deficiency anemia, unspecified: Secondary | ICD-10-CM | POA: Diagnosis not present

## 2011-08-08 DIAGNOSIS — N2581 Secondary hyperparathyroidism of renal origin: Secondary | ICD-10-CM | POA: Diagnosis not present

## 2011-08-08 DIAGNOSIS — Z23 Encounter for immunization: Secondary | ICD-10-CM | POA: Diagnosis not present

## 2011-08-08 DIAGNOSIS — Z992 Dependence on renal dialysis: Secondary | ICD-10-CM | POA: Diagnosis not present

## 2011-08-08 DIAGNOSIS — N186 End stage renal disease: Secondary | ICD-10-CM | POA: Diagnosis not present

## 2011-08-10 DIAGNOSIS — N2581 Secondary hyperparathyroidism of renal origin: Secondary | ICD-10-CM | POA: Diagnosis not present

## 2011-08-10 DIAGNOSIS — Z992 Dependence on renal dialysis: Secondary | ICD-10-CM | POA: Diagnosis not present

## 2011-08-10 DIAGNOSIS — Z23 Encounter for immunization: Secondary | ICD-10-CM | POA: Diagnosis not present

## 2011-08-10 DIAGNOSIS — N186 End stage renal disease: Secondary | ICD-10-CM | POA: Diagnosis not present

## 2011-08-10 DIAGNOSIS — D509 Iron deficiency anemia, unspecified: Secondary | ICD-10-CM | POA: Diagnosis not present

## 2011-08-11 ENCOUNTER — Ambulatory Visit: Payer: Self-pay | Admitting: Nephrology

## 2011-08-11 DIAGNOSIS — N289 Disorder of kidney and ureter, unspecified: Secondary | ICD-10-CM | POA: Diagnosis not present

## 2011-08-12 DIAGNOSIS — Z992 Dependence on renal dialysis: Secondary | ICD-10-CM | POA: Diagnosis not present

## 2011-08-12 DIAGNOSIS — N2581 Secondary hyperparathyroidism of renal origin: Secondary | ICD-10-CM | POA: Diagnosis not present

## 2011-08-12 DIAGNOSIS — N186 End stage renal disease: Secondary | ICD-10-CM | POA: Diagnosis not present

## 2011-08-12 DIAGNOSIS — Z23 Encounter for immunization: Secondary | ICD-10-CM | POA: Diagnosis not present

## 2011-08-12 DIAGNOSIS — D509 Iron deficiency anemia, unspecified: Secondary | ICD-10-CM | POA: Diagnosis not present

## 2011-08-15 DIAGNOSIS — N186 End stage renal disease: Secondary | ICD-10-CM | POA: Diagnosis not present

## 2011-08-15 DIAGNOSIS — N2581 Secondary hyperparathyroidism of renal origin: Secondary | ICD-10-CM | POA: Diagnosis not present

## 2011-08-15 DIAGNOSIS — D509 Iron deficiency anemia, unspecified: Secondary | ICD-10-CM | POA: Diagnosis not present

## 2011-08-15 DIAGNOSIS — Z992 Dependence on renal dialysis: Secondary | ICD-10-CM | POA: Diagnosis not present

## 2011-08-15 DIAGNOSIS — Z23 Encounter for immunization: Secondary | ICD-10-CM | POA: Diagnosis not present

## 2011-08-17 DIAGNOSIS — Z23 Encounter for immunization: Secondary | ICD-10-CM | POA: Diagnosis not present

## 2011-08-17 DIAGNOSIS — N2581 Secondary hyperparathyroidism of renal origin: Secondary | ICD-10-CM | POA: Diagnosis not present

## 2011-08-17 DIAGNOSIS — N186 End stage renal disease: Secondary | ICD-10-CM | POA: Diagnosis not present

## 2011-08-17 DIAGNOSIS — Z992 Dependence on renal dialysis: Secondary | ICD-10-CM | POA: Diagnosis not present

## 2011-08-17 DIAGNOSIS — D509 Iron deficiency anemia, unspecified: Secondary | ICD-10-CM | POA: Diagnosis not present

## 2011-08-18 DIAGNOSIS — N186 End stage renal disease: Secondary | ICD-10-CM | POA: Diagnosis not present

## 2011-08-19 DIAGNOSIS — D509 Iron deficiency anemia, unspecified: Secondary | ICD-10-CM | POA: Diagnosis not present

## 2011-08-19 DIAGNOSIS — N2581 Secondary hyperparathyroidism of renal origin: Secondary | ICD-10-CM | POA: Diagnosis not present

## 2011-08-19 DIAGNOSIS — Z992 Dependence on renal dialysis: Secondary | ICD-10-CM | POA: Diagnosis not present

## 2011-08-19 DIAGNOSIS — N186 End stage renal disease: Secondary | ICD-10-CM | POA: Diagnosis not present

## 2011-08-22 DIAGNOSIS — N186 End stage renal disease: Secondary | ICD-10-CM | POA: Diagnosis not present

## 2011-08-22 DIAGNOSIS — N2581 Secondary hyperparathyroidism of renal origin: Secondary | ICD-10-CM | POA: Diagnosis not present

## 2011-08-22 DIAGNOSIS — Z992 Dependence on renal dialysis: Secondary | ICD-10-CM | POA: Diagnosis not present

## 2011-08-22 DIAGNOSIS — D509 Iron deficiency anemia, unspecified: Secondary | ICD-10-CM | POA: Diagnosis not present

## 2011-08-24 DIAGNOSIS — N2581 Secondary hyperparathyroidism of renal origin: Secondary | ICD-10-CM | POA: Diagnosis not present

## 2011-08-24 DIAGNOSIS — D509 Iron deficiency anemia, unspecified: Secondary | ICD-10-CM | POA: Diagnosis not present

## 2011-08-24 DIAGNOSIS — Z992 Dependence on renal dialysis: Secondary | ICD-10-CM | POA: Diagnosis not present

## 2011-08-24 DIAGNOSIS — N186 End stage renal disease: Secondary | ICD-10-CM | POA: Diagnosis not present

## 2011-08-26 DIAGNOSIS — Z992 Dependence on renal dialysis: Secondary | ICD-10-CM | POA: Diagnosis not present

## 2011-08-26 DIAGNOSIS — N2581 Secondary hyperparathyroidism of renal origin: Secondary | ICD-10-CM | POA: Diagnosis not present

## 2011-08-26 DIAGNOSIS — N186 End stage renal disease: Secondary | ICD-10-CM | POA: Diagnosis not present

## 2011-08-26 DIAGNOSIS — D509 Iron deficiency anemia, unspecified: Secondary | ICD-10-CM | POA: Diagnosis not present

## 2011-08-29 DIAGNOSIS — N186 End stage renal disease: Secondary | ICD-10-CM | POA: Diagnosis not present

## 2011-08-29 DIAGNOSIS — N2581 Secondary hyperparathyroidism of renal origin: Secondary | ICD-10-CM | POA: Diagnosis not present

## 2011-08-29 DIAGNOSIS — Z992 Dependence on renal dialysis: Secondary | ICD-10-CM | POA: Diagnosis not present

## 2011-08-29 DIAGNOSIS — D509 Iron deficiency anemia, unspecified: Secondary | ICD-10-CM | POA: Diagnosis not present

## 2011-08-31 DIAGNOSIS — D509 Iron deficiency anemia, unspecified: Secondary | ICD-10-CM | POA: Diagnosis not present

## 2011-08-31 DIAGNOSIS — N2581 Secondary hyperparathyroidism of renal origin: Secondary | ICD-10-CM | POA: Diagnosis not present

## 2011-08-31 DIAGNOSIS — Z992 Dependence on renal dialysis: Secondary | ICD-10-CM | POA: Diagnosis not present

## 2011-08-31 DIAGNOSIS — N186 End stage renal disease: Secondary | ICD-10-CM | POA: Diagnosis not present

## 2011-09-02 DIAGNOSIS — D509 Iron deficiency anemia, unspecified: Secondary | ICD-10-CM | POA: Diagnosis not present

## 2011-09-02 DIAGNOSIS — Z992 Dependence on renal dialysis: Secondary | ICD-10-CM | POA: Diagnosis not present

## 2011-09-02 DIAGNOSIS — N186 End stage renal disease: Secondary | ICD-10-CM | POA: Diagnosis not present

## 2011-09-02 DIAGNOSIS — N2581 Secondary hyperparathyroidism of renal origin: Secondary | ICD-10-CM | POA: Diagnosis not present

## 2011-09-05 DIAGNOSIS — N186 End stage renal disease: Secondary | ICD-10-CM | POA: Diagnosis not present

## 2011-09-05 DIAGNOSIS — D509 Iron deficiency anemia, unspecified: Secondary | ICD-10-CM | POA: Diagnosis not present

## 2011-09-05 DIAGNOSIS — Z992 Dependence on renal dialysis: Secondary | ICD-10-CM | POA: Diagnosis not present

## 2011-09-05 DIAGNOSIS — N2581 Secondary hyperparathyroidism of renal origin: Secondary | ICD-10-CM | POA: Diagnosis not present

## 2011-09-07 DIAGNOSIS — Z992 Dependence on renal dialysis: Secondary | ICD-10-CM | POA: Diagnosis not present

## 2011-09-07 DIAGNOSIS — D509 Iron deficiency anemia, unspecified: Secondary | ICD-10-CM | POA: Diagnosis not present

## 2011-09-07 DIAGNOSIS — N2581 Secondary hyperparathyroidism of renal origin: Secondary | ICD-10-CM | POA: Diagnosis not present

## 2011-09-07 DIAGNOSIS — N186 End stage renal disease: Secondary | ICD-10-CM | POA: Diagnosis not present

## 2011-09-09 DIAGNOSIS — Z992 Dependence on renal dialysis: Secondary | ICD-10-CM | POA: Diagnosis not present

## 2011-09-09 DIAGNOSIS — D509 Iron deficiency anemia, unspecified: Secondary | ICD-10-CM | POA: Diagnosis not present

## 2011-09-09 DIAGNOSIS — N2581 Secondary hyperparathyroidism of renal origin: Secondary | ICD-10-CM | POA: Diagnosis not present

## 2011-09-09 DIAGNOSIS — N186 End stage renal disease: Secondary | ICD-10-CM | POA: Diagnosis not present

## 2011-09-12 DIAGNOSIS — Z992 Dependence on renal dialysis: Secondary | ICD-10-CM | POA: Diagnosis not present

## 2011-09-12 DIAGNOSIS — N2581 Secondary hyperparathyroidism of renal origin: Secondary | ICD-10-CM | POA: Diagnosis not present

## 2011-09-12 DIAGNOSIS — N186 End stage renal disease: Secondary | ICD-10-CM | POA: Diagnosis not present

## 2011-09-12 DIAGNOSIS — D509 Iron deficiency anemia, unspecified: Secondary | ICD-10-CM | POA: Diagnosis not present

## 2011-09-14 DIAGNOSIS — D509 Iron deficiency anemia, unspecified: Secondary | ICD-10-CM | POA: Diagnosis not present

## 2011-09-14 DIAGNOSIS — N2581 Secondary hyperparathyroidism of renal origin: Secondary | ICD-10-CM | POA: Diagnosis not present

## 2011-09-14 DIAGNOSIS — N186 End stage renal disease: Secondary | ICD-10-CM | POA: Diagnosis not present

## 2011-09-14 DIAGNOSIS — Z992 Dependence on renal dialysis: Secondary | ICD-10-CM | POA: Diagnosis not present

## 2011-09-16 DIAGNOSIS — D509 Iron deficiency anemia, unspecified: Secondary | ICD-10-CM | POA: Diagnosis not present

## 2011-09-16 DIAGNOSIS — N2581 Secondary hyperparathyroidism of renal origin: Secondary | ICD-10-CM | POA: Diagnosis not present

## 2011-09-16 DIAGNOSIS — Z992 Dependence on renal dialysis: Secondary | ICD-10-CM | POA: Diagnosis not present

## 2011-09-16 DIAGNOSIS — N186 End stage renal disease: Secondary | ICD-10-CM | POA: Diagnosis not present

## 2011-09-18 DIAGNOSIS — N186 End stage renal disease: Secondary | ICD-10-CM | POA: Diagnosis not present

## 2011-09-19 DIAGNOSIS — D509 Iron deficiency anemia, unspecified: Secondary | ICD-10-CM | POA: Diagnosis not present

## 2011-09-19 DIAGNOSIS — Z23 Encounter for immunization: Secondary | ICD-10-CM | POA: Diagnosis not present

## 2011-09-19 DIAGNOSIS — Z992 Dependence on renal dialysis: Secondary | ICD-10-CM | POA: Diagnosis not present

## 2011-09-19 DIAGNOSIS — N2581 Secondary hyperparathyroidism of renal origin: Secondary | ICD-10-CM | POA: Diagnosis not present

## 2011-09-19 DIAGNOSIS — N186 End stage renal disease: Secondary | ICD-10-CM | POA: Diagnosis not present

## 2011-09-21 DIAGNOSIS — Z23 Encounter for immunization: Secondary | ICD-10-CM | POA: Diagnosis not present

## 2011-09-21 DIAGNOSIS — N186 End stage renal disease: Secondary | ICD-10-CM | POA: Diagnosis not present

## 2011-09-21 DIAGNOSIS — N2581 Secondary hyperparathyroidism of renal origin: Secondary | ICD-10-CM | POA: Diagnosis not present

## 2011-09-21 DIAGNOSIS — Z992 Dependence on renal dialysis: Secondary | ICD-10-CM | POA: Diagnosis not present

## 2011-09-21 DIAGNOSIS — D509 Iron deficiency anemia, unspecified: Secondary | ICD-10-CM | POA: Diagnosis not present

## 2011-09-23 DIAGNOSIS — Z23 Encounter for immunization: Secondary | ICD-10-CM | POA: Diagnosis not present

## 2011-09-23 DIAGNOSIS — Z992 Dependence on renal dialysis: Secondary | ICD-10-CM | POA: Diagnosis not present

## 2011-09-23 DIAGNOSIS — N2581 Secondary hyperparathyroidism of renal origin: Secondary | ICD-10-CM | POA: Diagnosis not present

## 2011-09-23 DIAGNOSIS — D509 Iron deficiency anemia, unspecified: Secondary | ICD-10-CM | POA: Diagnosis not present

## 2011-09-23 DIAGNOSIS — N186 End stage renal disease: Secondary | ICD-10-CM | POA: Diagnosis not present

## 2011-09-26 DIAGNOSIS — D509 Iron deficiency anemia, unspecified: Secondary | ICD-10-CM | POA: Diagnosis not present

## 2011-09-26 DIAGNOSIS — N2581 Secondary hyperparathyroidism of renal origin: Secondary | ICD-10-CM | POA: Diagnosis not present

## 2011-09-26 DIAGNOSIS — Z23 Encounter for immunization: Secondary | ICD-10-CM | POA: Diagnosis not present

## 2011-09-26 DIAGNOSIS — N186 End stage renal disease: Secondary | ICD-10-CM | POA: Diagnosis not present

## 2011-09-26 DIAGNOSIS — Z992 Dependence on renal dialysis: Secondary | ICD-10-CM | POA: Diagnosis not present

## 2011-09-28 DIAGNOSIS — N2581 Secondary hyperparathyroidism of renal origin: Secondary | ICD-10-CM | POA: Diagnosis not present

## 2011-09-28 DIAGNOSIS — Z992 Dependence on renal dialysis: Secondary | ICD-10-CM | POA: Diagnosis not present

## 2011-09-28 DIAGNOSIS — D509 Iron deficiency anemia, unspecified: Secondary | ICD-10-CM | POA: Diagnosis not present

## 2011-09-28 DIAGNOSIS — N186 End stage renal disease: Secondary | ICD-10-CM | POA: Diagnosis not present

## 2011-09-28 DIAGNOSIS — Z23 Encounter for immunization: Secondary | ICD-10-CM | POA: Diagnosis not present

## 2011-09-30 DIAGNOSIS — N2581 Secondary hyperparathyroidism of renal origin: Secondary | ICD-10-CM | POA: Diagnosis not present

## 2011-09-30 DIAGNOSIS — N186 End stage renal disease: Secondary | ICD-10-CM | POA: Diagnosis not present

## 2011-09-30 DIAGNOSIS — Z23 Encounter for immunization: Secondary | ICD-10-CM | POA: Diagnosis not present

## 2011-09-30 DIAGNOSIS — D509 Iron deficiency anemia, unspecified: Secondary | ICD-10-CM | POA: Diagnosis not present

## 2011-09-30 DIAGNOSIS — Z992 Dependence on renal dialysis: Secondary | ICD-10-CM | POA: Diagnosis not present

## 2011-10-03 DIAGNOSIS — N186 End stage renal disease: Secondary | ICD-10-CM | POA: Diagnosis not present

## 2011-10-03 DIAGNOSIS — N2581 Secondary hyperparathyroidism of renal origin: Secondary | ICD-10-CM | POA: Diagnosis not present

## 2011-10-03 DIAGNOSIS — D509 Iron deficiency anemia, unspecified: Secondary | ICD-10-CM | POA: Diagnosis not present

## 2011-10-03 DIAGNOSIS — Z23 Encounter for immunization: Secondary | ICD-10-CM | POA: Diagnosis not present

## 2011-10-03 DIAGNOSIS — Z992 Dependence on renal dialysis: Secondary | ICD-10-CM | POA: Diagnosis not present

## 2011-10-05 DIAGNOSIS — D509 Iron deficiency anemia, unspecified: Secondary | ICD-10-CM | POA: Diagnosis not present

## 2011-10-05 DIAGNOSIS — Z23 Encounter for immunization: Secondary | ICD-10-CM | POA: Diagnosis not present

## 2011-10-05 DIAGNOSIS — Z992 Dependence on renal dialysis: Secondary | ICD-10-CM | POA: Diagnosis not present

## 2011-10-05 DIAGNOSIS — N2581 Secondary hyperparathyroidism of renal origin: Secondary | ICD-10-CM | POA: Diagnosis not present

## 2011-10-05 DIAGNOSIS — N186 End stage renal disease: Secondary | ICD-10-CM | POA: Diagnosis not present

## 2011-10-07 DIAGNOSIS — D509 Iron deficiency anemia, unspecified: Secondary | ICD-10-CM | POA: Diagnosis not present

## 2011-10-07 DIAGNOSIS — Z992 Dependence on renal dialysis: Secondary | ICD-10-CM | POA: Diagnosis not present

## 2011-10-07 DIAGNOSIS — N2581 Secondary hyperparathyroidism of renal origin: Secondary | ICD-10-CM | POA: Diagnosis not present

## 2011-10-07 DIAGNOSIS — Z23 Encounter for immunization: Secondary | ICD-10-CM | POA: Diagnosis not present

## 2011-10-07 DIAGNOSIS — N186 End stage renal disease: Secondary | ICD-10-CM | POA: Diagnosis not present

## 2011-10-10 DIAGNOSIS — Z992 Dependence on renal dialysis: Secondary | ICD-10-CM | POA: Diagnosis not present

## 2011-10-10 DIAGNOSIS — Z23 Encounter for immunization: Secondary | ICD-10-CM | POA: Diagnosis not present

## 2011-10-10 DIAGNOSIS — N186 End stage renal disease: Secondary | ICD-10-CM | POA: Diagnosis not present

## 2011-10-10 DIAGNOSIS — N2581 Secondary hyperparathyroidism of renal origin: Secondary | ICD-10-CM | POA: Diagnosis not present

## 2011-10-10 DIAGNOSIS — D509 Iron deficiency anemia, unspecified: Secondary | ICD-10-CM | POA: Diagnosis not present

## 2011-10-11 DIAGNOSIS — I10 Essential (primary) hypertension: Secondary | ICD-10-CM | POA: Diagnosis not present

## 2011-10-11 DIAGNOSIS — F411 Generalized anxiety disorder: Secondary | ICD-10-CM | POA: Diagnosis not present

## 2011-10-11 DIAGNOSIS — I120 Hypertensive chronic kidney disease with stage 5 chronic kidney disease or end stage renal disease: Secondary | ICD-10-CM | POA: Diagnosis not present

## 2011-10-12 DIAGNOSIS — N2581 Secondary hyperparathyroidism of renal origin: Secondary | ICD-10-CM | POA: Diagnosis not present

## 2011-10-12 DIAGNOSIS — Z992 Dependence on renal dialysis: Secondary | ICD-10-CM | POA: Diagnosis not present

## 2011-10-12 DIAGNOSIS — N186 End stage renal disease: Secondary | ICD-10-CM | POA: Diagnosis not present

## 2011-10-12 DIAGNOSIS — Z23 Encounter for immunization: Secondary | ICD-10-CM | POA: Diagnosis not present

## 2011-10-12 DIAGNOSIS — D509 Iron deficiency anemia, unspecified: Secondary | ICD-10-CM | POA: Diagnosis not present

## 2011-10-14 DIAGNOSIS — N2581 Secondary hyperparathyroidism of renal origin: Secondary | ICD-10-CM | POA: Diagnosis not present

## 2011-10-14 DIAGNOSIS — D509 Iron deficiency anemia, unspecified: Secondary | ICD-10-CM | POA: Diagnosis not present

## 2011-10-14 DIAGNOSIS — Z992 Dependence on renal dialysis: Secondary | ICD-10-CM | POA: Diagnosis not present

## 2011-10-14 DIAGNOSIS — N186 End stage renal disease: Secondary | ICD-10-CM | POA: Diagnosis not present

## 2011-10-14 DIAGNOSIS — Z23 Encounter for immunization: Secondary | ICD-10-CM | POA: Diagnosis not present

## 2011-10-17 DIAGNOSIS — N2581 Secondary hyperparathyroidism of renal origin: Secondary | ICD-10-CM | POA: Diagnosis not present

## 2011-10-17 DIAGNOSIS — Z23 Encounter for immunization: Secondary | ICD-10-CM | POA: Diagnosis not present

## 2011-10-17 DIAGNOSIS — Z992 Dependence on renal dialysis: Secondary | ICD-10-CM | POA: Diagnosis not present

## 2011-10-17 DIAGNOSIS — D509 Iron deficiency anemia, unspecified: Secondary | ICD-10-CM | POA: Diagnosis not present

## 2011-10-17 DIAGNOSIS — N186 End stage renal disease: Secondary | ICD-10-CM | POA: Diagnosis not present

## 2011-10-18 DIAGNOSIS — N186 End stage renal disease: Secondary | ICD-10-CM | POA: Diagnosis not present

## 2011-10-19 DIAGNOSIS — N2581 Secondary hyperparathyroidism of renal origin: Secondary | ICD-10-CM | POA: Diagnosis not present

## 2011-10-19 DIAGNOSIS — Z992 Dependence on renal dialysis: Secondary | ICD-10-CM | POA: Diagnosis not present

## 2011-10-19 DIAGNOSIS — D509 Iron deficiency anemia, unspecified: Secondary | ICD-10-CM | POA: Diagnosis not present

## 2011-10-19 DIAGNOSIS — N186 End stage renal disease: Secondary | ICD-10-CM | POA: Diagnosis not present

## 2011-10-21 DIAGNOSIS — N2581 Secondary hyperparathyroidism of renal origin: Secondary | ICD-10-CM | POA: Diagnosis not present

## 2011-10-21 DIAGNOSIS — Z992 Dependence on renal dialysis: Secondary | ICD-10-CM | POA: Diagnosis not present

## 2011-10-21 DIAGNOSIS — D509 Iron deficiency anemia, unspecified: Secondary | ICD-10-CM | POA: Diagnosis not present

## 2011-10-21 DIAGNOSIS — N186 End stage renal disease: Secondary | ICD-10-CM | POA: Diagnosis not present

## 2011-10-24 DIAGNOSIS — N2581 Secondary hyperparathyroidism of renal origin: Secondary | ICD-10-CM | POA: Diagnosis not present

## 2011-10-24 DIAGNOSIS — N186 End stage renal disease: Secondary | ICD-10-CM | POA: Diagnosis not present

## 2011-10-24 DIAGNOSIS — D509 Iron deficiency anemia, unspecified: Secondary | ICD-10-CM | POA: Diagnosis not present

## 2011-10-24 DIAGNOSIS — Z992 Dependence on renal dialysis: Secondary | ICD-10-CM | POA: Diagnosis not present

## 2011-10-26 DIAGNOSIS — Z992 Dependence on renal dialysis: Secondary | ICD-10-CM | POA: Diagnosis not present

## 2011-10-26 DIAGNOSIS — N2581 Secondary hyperparathyroidism of renal origin: Secondary | ICD-10-CM | POA: Diagnosis not present

## 2011-10-26 DIAGNOSIS — N186 End stage renal disease: Secondary | ICD-10-CM | POA: Diagnosis not present

## 2011-10-26 DIAGNOSIS — D509 Iron deficiency anemia, unspecified: Secondary | ICD-10-CM | POA: Diagnosis not present

## 2011-10-28 DIAGNOSIS — Z992 Dependence on renal dialysis: Secondary | ICD-10-CM | POA: Diagnosis not present

## 2011-10-28 DIAGNOSIS — D509 Iron deficiency anemia, unspecified: Secondary | ICD-10-CM | POA: Diagnosis not present

## 2011-10-28 DIAGNOSIS — N2581 Secondary hyperparathyroidism of renal origin: Secondary | ICD-10-CM | POA: Diagnosis not present

## 2011-10-28 DIAGNOSIS — N186 End stage renal disease: Secondary | ICD-10-CM | POA: Diagnosis not present

## 2011-10-31 DIAGNOSIS — Z992 Dependence on renal dialysis: Secondary | ICD-10-CM | POA: Diagnosis not present

## 2011-10-31 DIAGNOSIS — N2581 Secondary hyperparathyroidism of renal origin: Secondary | ICD-10-CM | POA: Diagnosis not present

## 2011-10-31 DIAGNOSIS — D509 Iron deficiency anemia, unspecified: Secondary | ICD-10-CM | POA: Diagnosis not present

## 2011-10-31 DIAGNOSIS — N186 End stage renal disease: Secondary | ICD-10-CM | POA: Diagnosis not present

## 2011-11-02 DIAGNOSIS — D509 Iron deficiency anemia, unspecified: Secondary | ICD-10-CM | POA: Diagnosis not present

## 2011-11-02 DIAGNOSIS — N186 End stage renal disease: Secondary | ICD-10-CM | POA: Diagnosis not present

## 2011-11-02 DIAGNOSIS — E785 Hyperlipidemia, unspecified: Secondary | ICD-10-CM | POA: Diagnosis not present

## 2011-11-02 DIAGNOSIS — Z992 Dependence on renal dialysis: Secondary | ICD-10-CM | POA: Diagnosis not present

## 2011-11-02 DIAGNOSIS — N2581 Secondary hyperparathyroidism of renal origin: Secondary | ICD-10-CM | POA: Diagnosis not present

## 2011-11-04 DIAGNOSIS — D509 Iron deficiency anemia, unspecified: Secondary | ICD-10-CM | POA: Diagnosis not present

## 2011-11-04 DIAGNOSIS — Z992 Dependence on renal dialysis: Secondary | ICD-10-CM | POA: Diagnosis not present

## 2011-11-04 DIAGNOSIS — N2581 Secondary hyperparathyroidism of renal origin: Secondary | ICD-10-CM | POA: Diagnosis not present

## 2011-11-04 DIAGNOSIS — N186 End stage renal disease: Secondary | ICD-10-CM | POA: Diagnosis not present

## 2011-11-07 DIAGNOSIS — N2581 Secondary hyperparathyroidism of renal origin: Secondary | ICD-10-CM | POA: Diagnosis not present

## 2011-11-07 DIAGNOSIS — Z992 Dependence on renal dialysis: Secondary | ICD-10-CM | POA: Diagnosis not present

## 2011-11-07 DIAGNOSIS — N186 End stage renal disease: Secondary | ICD-10-CM | POA: Diagnosis not present

## 2011-11-07 DIAGNOSIS — D509 Iron deficiency anemia, unspecified: Secondary | ICD-10-CM | POA: Diagnosis not present

## 2011-11-09 DIAGNOSIS — Z992 Dependence on renal dialysis: Secondary | ICD-10-CM | POA: Diagnosis not present

## 2011-11-09 DIAGNOSIS — N186 End stage renal disease: Secondary | ICD-10-CM | POA: Diagnosis not present

## 2011-11-09 DIAGNOSIS — D509 Iron deficiency anemia, unspecified: Secondary | ICD-10-CM | POA: Diagnosis not present

## 2011-11-09 DIAGNOSIS — N2581 Secondary hyperparathyroidism of renal origin: Secondary | ICD-10-CM | POA: Diagnosis not present

## 2011-11-11 DIAGNOSIS — N2581 Secondary hyperparathyroidism of renal origin: Secondary | ICD-10-CM | POA: Diagnosis not present

## 2011-11-11 DIAGNOSIS — D509 Iron deficiency anemia, unspecified: Secondary | ICD-10-CM | POA: Diagnosis not present

## 2011-11-11 DIAGNOSIS — N186 End stage renal disease: Secondary | ICD-10-CM | POA: Diagnosis not present

## 2011-11-11 DIAGNOSIS — Z992 Dependence on renal dialysis: Secondary | ICD-10-CM | POA: Diagnosis not present

## 2011-11-14 DIAGNOSIS — Z992 Dependence on renal dialysis: Secondary | ICD-10-CM | POA: Diagnosis not present

## 2011-11-14 DIAGNOSIS — N2581 Secondary hyperparathyroidism of renal origin: Secondary | ICD-10-CM | POA: Diagnosis not present

## 2011-11-14 DIAGNOSIS — D509 Iron deficiency anemia, unspecified: Secondary | ICD-10-CM | POA: Diagnosis not present

## 2011-11-14 DIAGNOSIS — N186 End stage renal disease: Secondary | ICD-10-CM | POA: Diagnosis not present

## 2011-11-16 DIAGNOSIS — N2581 Secondary hyperparathyroidism of renal origin: Secondary | ICD-10-CM | POA: Diagnosis not present

## 2011-11-16 DIAGNOSIS — D509 Iron deficiency anemia, unspecified: Secondary | ICD-10-CM | POA: Diagnosis not present

## 2011-11-16 DIAGNOSIS — Z992 Dependence on renal dialysis: Secondary | ICD-10-CM | POA: Diagnosis not present

## 2011-11-16 DIAGNOSIS — N186 End stage renal disease: Secondary | ICD-10-CM | POA: Diagnosis not present

## 2011-11-18 DIAGNOSIS — D509 Iron deficiency anemia, unspecified: Secondary | ICD-10-CM | POA: Diagnosis not present

## 2011-11-18 DIAGNOSIS — Z992 Dependence on renal dialysis: Secondary | ICD-10-CM | POA: Diagnosis not present

## 2011-11-18 DIAGNOSIS — N2581 Secondary hyperparathyroidism of renal origin: Secondary | ICD-10-CM | POA: Diagnosis not present

## 2011-11-18 DIAGNOSIS — E8779 Other fluid overload: Secondary | ICD-10-CM | POA: Diagnosis not present

## 2011-11-18 DIAGNOSIS — N186 End stage renal disease: Secondary | ICD-10-CM | POA: Diagnosis not present

## 2011-11-21 DIAGNOSIS — N186 End stage renal disease: Secondary | ICD-10-CM | POA: Diagnosis not present

## 2011-11-21 DIAGNOSIS — D509 Iron deficiency anemia, unspecified: Secondary | ICD-10-CM | POA: Diagnosis not present

## 2011-11-21 DIAGNOSIS — Z992 Dependence on renal dialysis: Secondary | ICD-10-CM | POA: Diagnosis not present

## 2011-11-21 DIAGNOSIS — N2581 Secondary hyperparathyroidism of renal origin: Secondary | ICD-10-CM | POA: Diagnosis not present

## 2011-11-21 DIAGNOSIS — E8779 Other fluid overload: Secondary | ICD-10-CM | POA: Diagnosis not present

## 2011-11-23 DIAGNOSIS — Z992 Dependence on renal dialysis: Secondary | ICD-10-CM | POA: Diagnosis not present

## 2011-11-23 DIAGNOSIS — N186 End stage renal disease: Secondary | ICD-10-CM | POA: Diagnosis not present

## 2011-11-23 DIAGNOSIS — D509 Iron deficiency anemia, unspecified: Secondary | ICD-10-CM | POA: Diagnosis not present

## 2011-11-23 DIAGNOSIS — E8779 Other fluid overload: Secondary | ICD-10-CM | POA: Diagnosis not present

## 2011-11-23 DIAGNOSIS — N2581 Secondary hyperparathyroidism of renal origin: Secondary | ICD-10-CM | POA: Diagnosis not present

## 2011-11-24 DIAGNOSIS — N186 End stage renal disease: Secondary | ICD-10-CM | POA: Diagnosis not present

## 2011-11-24 DIAGNOSIS — N2581 Secondary hyperparathyroidism of renal origin: Secondary | ICD-10-CM | POA: Diagnosis not present

## 2011-11-24 DIAGNOSIS — D509 Iron deficiency anemia, unspecified: Secondary | ICD-10-CM | POA: Diagnosis not present

## 2011-11-24 DIAGNOSIS — E8779 Other fluid overload: Secondary | ICD-10-CM | POA: Diagnosis not present

## 2011-11-24 DIAGNOSIS — Z992 Dependence on renal dialysis: Secondary | ICD-10-CM | POA: Diagnosis not present

## 2011-11-25 DIAGNOSIS — Z992 Dependence on renal dialysis: Secondary | ICD-10-CM | POA: Diagnosis not present

## 2011-11-25 DIAGNOSIS — N186 End stage renal disease: Secondary | ICD-10-CM | POA: Diagnosis not present

## 2011-11-25 DIAGNOSIS — N2581 Secondary hyperparathyroidism of renal origin: Secondary | ICD-10-CM | POA: Diagnosis not present

## 2011-11-25 DIAGNOSIS — E8779 Other fluid overload: Secondary | ICD-10-CM | POA: Diagnosis not present

## 2011-11-25 DIAGNOSIS — D509 Iron deficiency anemia, unspecified: Secondary | ICD-10-CM | POA: Diagnosis not present

## 2011-11-28 DIAGNOSIS — E8779 Other fluid overload: Secondary | ICD-10-CM | POA: Diagnosis not present

## 2011-11-28 DIAGNOSIS — Z992 Dependence on renal dialysis: Secondary | ICD-10-CM | POA: Diagnosis not present

## 2011-11-28 DIAGNOSIS — D509 Iron deficiency anemia, unspecified: Secondary | ICD-10-CM | POA: Diagnosis not present

## 2011-11-28 DIAGNOSIS — N186 End stage renal disease: Secondary | ICD-10-CM | POA: Diagnosis not present

## 2011-11-28 DIAGNOSIS — N2581 Secondary hyperparathyroidism of renal origin: Secondary | ICD-10-CM | POA: Diagnosis not present

## 2011-11-30 DIAGNOSIS — E8779 Other fluid overload: Secondary | ICD-10-CM | POA: Diagnosis not present

## 2011-11-30 DIAGNOSIS — N186 End stage renal disease: Secondary | ICD-10-CM | POA: Diagnosis not present

## 2011-11-30 DIAGNOSIS — N2581 Secondary hyperparathyroidism of renal origin: Secondary | ICD-10-CM | POA: Diagnosis not present

## 2011-11-30 DIAGNOSIS — Z992 Dependence on renal dialysis: Secondary | ICD-10-CM | POA: Diagnosis not present

## 2011-11-30 DIAGNOSIS — D509 Iron deficiency anemia, unspecified: Secondary | ICD-10-CM | POA: Diagnosis not present

## 2011-12-02 DIAGNOSIS — N2581 Secondary hyperparathyroidism of renal origin: Secondary | ICD-10-CM | POA: Diagnosis not present

## 2011-12-02 DIAGNOSIS — N186 End stage renal disease: Secondary | ICD-10-CM | POA: Diagnosis not present

## 2011-12-02 DIAGNOSIS — Z992 Dependence on renal dialysis: Secondary | ICD-10-CM | POA: Diagnosis not present

## 2011-12-02 DIAGNOSIS — E8779 Other fluid overload: Secondary | ICD-10-CM | POA: Diagnosis not present

## 2011-12-02 DIAGNOSIS — D509 Iron deficiency anemia, unspecified: Secondary | ICD-10-CM | POA: Diagnosis not present

## 2011-12-05 DIAGNOSIS — N186 End stage renal disease: Secondary | ICD-10-CM | POA: Diagnosis not present

## 2011-12-05 DIAGNOSIS — N2581 Secondary hyperparathyroidism of renal origin: Secondary | ICD-10-CM | POA: Diagnosis not present

## 2011-12-05 DIAGNOSIS — Z992 Dependence on renal dialysis: Secondary | ICD-10-CM | POA: Diagnosis not present

## 2011-12-05 DIAGNOSIS — E8779 Other fluid overload: Secondary | ICD-10-CM | POA: Diagnosis not present

## 2011-12-05 DIAGNOSIS — D509 Iron deficiency anemia, unspecified: Secondary | ICD-10-CM | POA: Diagnosis not present

## 2011-12-07 DIAGNOSIS — N2581 Secondary hyperparathyroidism of renal origin: Secondary | ICD-10-CM | POA: Diagnosis not present

## 2011-12-07 DIAGNOSIS — Z992 Dependence on renal dialysis: Secondary | ICD-10-CM | POA: Diagnosis not present

## 2011-12-07 DIAGNOSIS — N186 End stage renal disease: Secondary | ICD-10-CM | POA: Diagnosis not present

## 2011-12-07 DIAGNOSIS — E8779 Other fluid overload: Secondary | ICD-10-CM | POA: Diagnosis not present

## 2011-12-07 DIAGNOSIS — D509 Iron deficiency anemia, unspecified: Secondary | ICD-10-CM | POA: Diagnosis not present

## 2011-12-09 DIAGNOSIS — E8779 Other fluid overload: Secondary | ICD-10-CM | POA: Diagnosis not present

## 2011-12-09 DIAGNOSIS — Z992 Dependence on renal dialysis: Secondary | ICD-10-CM | POA: Diagnosis not present

## 2011-12-09 DIAGNOSIS — N186 End stage renal disease: Secondary | ICD-10-CM | POA: Diagnosis not present

## 2011-12-09 DIAGNOSIS — D509 Iron deficiency anemia, unspecified: Secondary | ICD-10-CM | POA: Diagnosis not present

## 2011-12-09 DIAGNOSIS — N2581 Secondary hyperparathyroidism of renal origin: Secondary | ICD-10-CM | POA: Diagnosis not present

## 2011-12-11 DIAGNOSIS — E8779 Other fluid overload: Secondary | ICD-10-CM | POA: Diagnosis not present

## 2011-12-11 DIAGNOSIS — D509 Iron deficiency anemia, unspecified: Secondary | ICD-10-CM | POA: Diagnosis not present

## 2011-12-11 DIAGNOSIS — Z992 Dependence on renal dialysis: Secondary | ICD-10-CM | POA: Diagnosis not present

## 2011-12-11 DIAGNOSIS — N186 End stage renal disease: Secondary | ICD-10-CM | POA: Diagnosis not present

## 2011-12-11 DIAGNOSIS — N2581 Secondary hyperparathyroidism of renal origin: Secondary | ICD-10-CM | POA: Diagnosis not present

## 2011-12-13 DIAGNOSIS — Z992 Dependence on renal dialysis: Secondary | ICD-10-CM | POA: Diagnosis not present

## 2011-12-13 DIAGNOSIS — D509 Iron deficiency anemia, unspecified: Secondary | ICD-10-CM | POA: Diagnosis not present

## 2011-12-13 DIAGNOSIS — N2581 Secondary hyperparathyroidism of renal origin: Secondary | ICD-10-CM | POA: Diagnosis not present

## 2011-12-13 DIAGNOSIS — N186 End stage renal disease: Secondary | ICD-10-CM | POA: Diagnosis not present

## 2011-12-13 DIAGNOSIS — E8779 Other fluid overload: Secondary | ICD-10-CM | POA: Diagnosis not present

## 2011-12-16 DIAGNOSIS — E8779 Other fluid overload: Secondary | ICD-10-CM | POA: Diagnosis not present

## 2011-12-16 DIAGNOSIS — Z992 Dependence on renal dialysis: Secondary | ICD-10-CM | POA: Diagnosis not present

## 2011-12-16 DIAGNOSIS — D509 Iron deficiency anemia, unspecified: Secondary | ICD-10-CM | POA: Diagnosis not present

## 2011-12-16 DIAGNOSIS — N186 End stage renal disease: Secondary | ICD-10-CM | POA: Diagnosis not present

## 2011-12-16 DIAGNOSIS — N2581 Secondary hyperparathyroidism of renal origin: Secondary | ICD-10-CM | POA: Diagnosis not present

## 2011-12-18 DIAGNOSIS — N186 End stage renal disease: Secondary | ICD-10-CM | POA: Diagnosis not present

## 2011-12-19 DIAGNOSIS — Z992 Dependence on renal dialysis: Secondary | ICD-10-CM | POA: Diagnosis not present

## 2011-12-19 DIAGNOSIS — E8779 Other fluid overload: Secondary | ICD-10-CM | POA: Diagnosis not present

## 2011-12-19 DIAGNOSIS — D509 Iron deficiency anemia, unspecified: Secondary | ICD-10-CM | POA: Diagnosis not present

## 2011-12-19 DIAGNOSIS — N2581 Secondary hyperparathyroidism of renal origin: Secondary | ICD-10-CM | POA: Diagnosis not present

## 2011-12-19 DIAGNOSIS — N186 End stage renal disease: Secondary | ICD-10-CM | POA: Diagnosis not present

## 2011-12-21 DIAGNOSIS — D509 Iron deficiency anemia, unspecified: Secondary | ICD-10-CM | POA: Diagnosis not present

## 2011-12-21 DIAGNOSIS — N186 End stage renal disease: Secondary | ICD-10-CM | POA: Diagnosis not present

## 2011-12-21 DIAGNOSIS — N2581 Secondary hyperparathyroidism of renal origin: Secondary | ICD-10-CM | POA: Diagnosis not present

## 2011-12-21 DIAGNOSIS — E8779 Other fluid overload: Secondary | ICD-10-CM | POA: Diagnosis not present

## 2011-12-21 DIAGNOSIS — Z992 Dependence on renal dialysis: Secondary | ICD-10-CM | POA: Diagnosis not present

## 2011-12-22 DIAGNOSIS — D509 Iron deficiency anemia, unspecified: Secondary | ICD-10-CM | POA: Diagnosis not present

## 2011-12-22 DIAGNOSIS — N186 End stage renal disease: Secondary | ICD-10-CM | POA: Diagnosis not present

## 2011-12-22 DIAGNOSIS — Z992 Dependence on renal dialysis: Secondary | ICD-10-CM | POA: Diagnosis not present

## 2011-12-22 DIAGNOSIS — N2581 Secondary hyperparathyroidism of renal origin: Secondary | ICD-10-CM | POA: Diagnosis not present

## 2011-12-22 DIAGNOSIS — E8779 Other fluid overload: Secondary | ICD-10-CM | POA: Diagnosis not present

## 2011-12-23 DIAGNOSIS — E8779 Other fluid overload: Secondary | ICD-10-CM | POA: Diagnosis not present

## 2011-12-23 DIAGNOSIS — Z992 Dependence on renal dialysis: Secondary | ICD-10-CM | POA: Diagnosis not present

## 2011-12-23 DIAGNOSIS — N186 End stage renal disease: Secondary | ICD-10-CM | POA: Diagnosis not present

## 2011-12-23 DIAGNOSIS — D509 Iron deficiency anemia, unspecified: Secondary | ICD-10-CM | POA: Diagnosis not present

## 2011-12-23 DIAGNOSIS — N2581 Secondary hyperparathyroidism of renal origin: Secondary | ICD-10-CM | POA: Diagnosis not present

## 2011-12-26 DIAGNOSIS — N186 End stage renal disease: Secondary | ICD-10-CM | POA: Diagnosis not present

## 2011-12-26 DIAGNOSIS — Z992 Dependence on renal dialysis: Secondary | ICD-10-CM | POA: Diagnosis not present

## 2011-12-26 DIAGNOSIS — D509 Iron deficiency anemia, unspecified: Secondary | ICD-10-CM | POA: Diagnosis not present

## 2011-12-26 DIAGNOSIS — N2581 Secondary hyperparathyroidism of renal origin: Secondary | ICD-10-CM | POA: Diagnosis not present

## 2011-12-26 DIAGNOSIS — E8779 Other fluid overload: Secondary | ICD-10-CM | POA: Diagnosis not present

## 2011-12-28 DIAGNOSIS — N2581 Secondary hyperparathyroidism of renal origin: Secondary | ICD-10-CM | POA: Diagnosis not present

## 2011-12-28 DIAGNOSIS — N186 End stage renal disease: Secondary | ICD-10-CM | POA: Diagnosis not present

## 2011-12-28 DIAGNOSIS — D509 Iron deficiency anemia, unspecified: Secondary | ICD-10-CM | POA: Diagnosis not present

## 2011-12-28 DIAGNOSIS — Z992 Dependence on renal dialysis: Secondary | ICD-10-CM | POA: Diagnosis not present

## 2011-12-28 DIAGNOSIS — E8779 Other fluid overload: Secondary | ICD-10-CM | POA: Diagnosis not present

## 2011-12-30 DIAGNOSIS — Z992 Dependence on renal dialysis: Secondary | ICD-10-CM | POA: Diagnosis not present

## 2011-12-30 DIAGNOSIS — N186 End stage renal disease: Secondary | ICD-10-CM | POA: Diagnosis not present

## 2011-12-30 DIAGNOSIS — N2581 Secondary hyperparathyroidism of renal origin: Secondary | ICD-10-CM | POA: Diagnosis not present

## 2011-12-30 DIAGNOSIS — E8779 Other fluid overload: Secondary | ICD-10-CM | POA: Diagnosis not present

## 2011-12-30 DIAGNOSIS — D509 Iron deficiency anemia, unspecified: Secondary | ICD-10-CM | POA: Diagnosis not present

## 2012-01-02 DIAGNOSIS — E8779 Other fluid overload: Secondary | ICD-10-CM | POA: Diagnosis not present

## 2012-01-02 DIAGNOSIS — N186 End stage renal disease: Secondary | ICD-10-CM | POA: Diagnosis not present

## 2012-01-02 DIAGNOSIS — N2581 Secondary hyperparathyroidism of renal origin: Secondary | ICD-10-CM | POA: Diagnosis not present

## 2012-01-02 DIAGNOSIS — D509 Iron deficiency anemia, unspecified: Secondary | ICD-10-CM | POA: Diagnosis not present

## 2012-01-02 DIAGNOSIS — Z992 Dependence on renal dialysis: Secondary | ICD-10-CM | POA: Diagnosis not present

## 2012-01-04 DIAGNOSIS — Z992 Dependence on renal dialysis: Secondary | ICD-10-CM | POA: Diagnosis not present

## 2012-01-04 DIAGNOSIS — E8779 Other fluid overload: Secondary | ICD-10-CM | POA: Diagnosis not present

## 2012-01-04 DIAGNOSIS — N2581 Secondary hyperparathyroidism of renal origin: Secondary | ICD-10-CM | POA: Diagnosis not present

## 2012-01-04 DIAGNOSIS — N186 End stage renal disease: Secondary | ICD-10-CM | POA: Diagnosis not present

## 2012-01-04 DIAGNOSIS — D509 Iron deficiency anemia, unspecified: Secondary | ICD-10-CM | POA: Diagnosis not present

## 2012-01-06 ENCOUNTER — Emergency Department (HOSPITAL_COMMUNITY)
Admission: EM | Admit: 2012-01-06 | Discharge: 2012-01-06 | Disposition: A | Discharge status: Home / Self Care | Payer: Medicare Other | Attending: Emergency Medicine | Admitting: Emergency Medicine

## 2012-01-06 ENCOUNTER — Emergency Department (HOSPITAL_COMMUNITY): Payer: Medicare Other

## 2012-01-06 ENCOUNTER — Encounter (HOSPITAL_COMMUNITY): Payer: Self-pay | Admitting: *Deleted

## 2012-01-06 DIAGNOSIS — F17200 Nicotine dependence, unspecified, uncomplicated: Secondary | ICD-10-CM | POA: Diagnosis not present

## 2012-01-06 DIAGNOSIS — N2581 Secondary hyperparathyroidism of renal origin: Secondary | ICD-10-CM | POA: Diagnosis not present

## 2012-01-06 DIAGNOSIS — S8001XA Contusion of right knee, initial encounter: Secondary | ICD-10-CM

## 2012-01-06 DIAGNOSIS — Y92410 Unspecified street and highway as the place of occurrence of the external cause: Secondary | ICD-10-CM | POA: Insufficient documentation

## 2012-01-06 DIAGNOSIS — Y9389 Activity, other specified: Secondary | ICD-10-CM | POA: Insufficient documentation

## 2012-01-06 DIAGNOSIS — S51009A Unspecified open wound of unspecified elbow, initial encounter: Secondary | ICD-10-CM | POA: Diagnosis not present

## 2012-01-06 DIAGNOSIS — S99929A Unspecified injury of unspecified foot, initial encounter: Secondary | ICD-10-CM | POA: Diagnosis not present

## 2012-01-06 DIAGNOSIS — S63501A Unspecified sprain of right wrist, initial encounter: Secondary | ICD-10-CM

## 2012-01-06 DIAGNOSIS — D509 Iron deficiency anemia, unspecified: Secondary | ICD-10-CM | POA: Diagnosis not present

## 2012-01-06 DIAGNOSIS — N186 End stage renal disease: Secondary | ICD-10-CM | POA: Diagnosis not present

## 2012-01-06 DIAGNOSIS — S51019A Laceration without foreign body of unspecified elbow, initial encounter: Secondary | ICD-10-CM

## 2012-01-06 DIAGNOSIS — Z992 Dependence on renal dialysis: Secondary | ICD-10-CM | POA: Diagnosis not present

## 2012-01-06 DIAGNOSIS — IMO0002 Reserved for concepts with insufficient information to code with codable children: Secondary | ICD-10-CM | POA: Diagnosis not present

## 2012-01-06 DIAGNOSIS — S51809A Unspecified open wound of unspecified forearm, initial encounter: Secondary | ICD-10-CM | POA: Diagnosis not present

## 2012-01-06 DIAGNOSIS — Z87448 Personal history of other diseases of urinary system: Secondary | ICD-10-CM | POA: Insufficient documentation

## 2012-01-06 DIAGNOSIS — S63509A Unspecified sprain of unspecified wrist, initial encounter: Secondary | ICD-10-CM | POA: Insufficient documentation

## 2012-01-06 DIAGNOSIS — S99919A Unspecified injury of unspecified ankle, initial encounter: Secondary | ICD-10-CM | POA: Diagnosis not present

## 2012-01-06 DIAGNOSIS — S8000XA Contusion of unspecified knee, initial encounter: Secondary | ICD-10-CM | POA: Insufficient documentation

## 2012-01-06 DIAGNOSIS — M549 Dorsalgia, unspecified: Secondary | ICD-10-CM | POA: Diagnosis not present

## 2012-01-06 DIAGNOSIS — M25569 Pain in unspecified knee: Secondary | ICD-10-CM | POA: Diagnosis not present

## 2012-01-06 DIAGNOSIS — E8779 Other fluid overload: Secondary | ICD-10-CM | POA: Diagnosis not present

## 2012-01-06 HISTORY — DX: Dependence on renal dialysis: Z99.2

## 2012-01-06 HISTORY — DX: Unspecified kidney failure: N19

## 2012-01-06 MED ORDER — LIDOCAINE-EPINEPHRINE (PF) 1 %-1:200000 IJ SOLN
INTRAMUSCULAR | Status: AC
  Administered 2012-01-06: 23:00:00
  Filled 2012-01-06: qty 10

## 2012-01-06 MED ORDER — CEFAZOLIN SODIUM-DEXTROSE 2-3 GM-% IV SOLR
2.0000 g | Freq: Once | INTRAVENOUS | Status: AC
  Administered 2012-01-06: 2 g via INTRAVENOUS
  Filled 2012-01-06: qty 50

## 2012-01-06 MED ORDER — CEFAZOLIN SODIUM-DEXTROSE 2-3 GM-% IV SOLR
INTRAVENOUS | Status: AC
  Filled 2012-01-06: qty 50

## 2012-01-06 MED ORDER — OXYCODONE-ACETAMINOPHEN 5-325 MG PO TABS: 2.0000 | ORAL_TABLET | ORAL | Status: DC | PRN

## 2012-01-06 MED ORDER — SODIUM CHLORIDE 0.9 % IV SOLN
Freq: Once | INTRAVENOUS | Status: AC
  Administered 2012-01-06: 20 mL/h via INTRAVENOUS

## 2012-01-06 MED ORDER — OXYCODONE-ACETAMINOPHEN 5-325 MG PO TABS: 1.0000 | ORAL_TABLET | Freq: Four times a day (QID) | ORAL | Status: DC | PRN

## 2012-01-06 MED ORDER — OXYCODONE-ACETAMINOPHEN 5-325 MG PO TABS
2.0000 | ORAL_TABLET | Freq: Once | ORAL | Status: AC
  Administered 2012-01-06: 2 via ORAL
  Filled 2012-01-06: qty 2

## 2012-01-06 MED ORDER — CEPHALEXIN 500 MG PO CAPS: 500.0000 mg | ORAL_CAPSULE | Freq: Four times a day (QID) | ORAL | Status: DC

## 2012-01-06 MED ORDER — BACITRACIN ZINC 500 UNIT/GM EX OINT
TOPICAL_OINTMENT | CUTANEOUS | Status: AC
  Administered 2012-01-06: 23:00:00
  Filled 2012-01-06: qty 0.9
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
## 2012-01-08 DIAGNOSIS — E8779 Other fluid overload: Secondary | ICD-10-CM | POA: Diagnosis not present

## 2012-01-08 DIAGNOSIS — D509 Iron deficiency anemia, unspecified: Secondary | ICD-10-CM | POA: Diagnosis not present

## 2012-01-08 DIAGNOSIS — N186 End stage renal disease: Secondary | ICD-10-CM | POA: Diagnosis not present

## 2012-01-08 DIAGNOSIS — N2581 Secondary hyperparathyroidism of renal origin: Secondary | ICD-10-CM | POA: Diagnosis not present

## 2012-01-08 DIAGNOSIS — Z992 Dependence on renal dialysis: Secondary | ICD-10-CM | POA: Diagnosis not present

## 2012-01-10 DIAGNOSIS — N2581 Secondary hyperparathyroidism of renal origin: Secondary | ICD-10-CM | POA: Diagnosis not present

## 2012-01-10 DIAGNOSIS — Z992 Dependence on renal dialysis: Secondary | ICD-10-CM | POA: Diagnosis not present

## 2012-01-10 DIAGNOSIS — N186 End stage renal disease: Secondary | ICD-10-CM | POA: Diagnosis not present

## 2012-01-10 DIAGNOSIS — E8779 Other fluid overload: Secondary | ICD-10-CM | POA: Diagnosis not present

## 2012-01-10 DIAGNOSIS — D509 Iron deficiency anemia, unspecified: Secondary | ICD-10-CM | POA: Diagnosis not present

## 2012-01-13 DIAGNOSIS — D509 Iron deficiency anemia, unspecified: Secondary | ICD-10-CM | POA: Diagnosis not present

## 2012-01-13 DIAGNOSIS — E8779 Other fluid overload: Secondary | ICD-10-CM | POA: Diagnosis not present

## 2012-01-13 DIAGNOSIS — Z992 Dependence on renal dialysis: Secondary | ICD-10-CM | POA: Diagnosis not present

## 2012-01-13 DIAGNOSIS — N2581 Secondary hyperparathyroidism of renal origin: Secondary | ICD-10-CM | POA: Diagnosis not present

## 2012-01-13 DIAGNOSIS — N186 End stage renal disease: Secondary | ICD-10-CM | POA: Diagnosis not present

## 2012-01-16 DIAGNOSIS — E8779 Other fluid overload: Secondary | ICD-10-CM | POA: Diagnosis not present

## 2012-01-16 DIAGNOSIS — D509 Iron deficiency anemia, unspecified: Secondary | ICD-10-CM | POA: Diagnosis not present

## 2012-01-16 DIAGNOSIS — N186 End stage renal disease: Secondary | ICD-10-CM | POA: Diagnosis not present

## 2012-01-16 DIAGNOSIS — N2581 Secondary hyperparathyroidism of renal origin: Secondary | ICD-10-CM | POA: Diagnosis not present

## 2012-01-16 DIAGNOSIS — Z992 Dependence on renal dialysis: Secondary | ICD-10-CM | POA: Diagnosis not present

## 2012-01-18 DIAGNOSIS — N186 End stage renal disease: Secondary | ICD-10-CM | POA: Diagnosis not present

## 2012-01-18 DIAGNOSIS — N2581 Secondary hyperparathyroidism of renal origin: Secondary | ICD-10-CM | POA: Diagnosis not present

## 2012-01-18 DIAGNOSIS — D509 Iron deficiency anemia, unspecified: Secondary | ICD-10-CM | POA: Diagnosis not present

## 2012-01-18 DIAGNOSIS — Z23 Encounter for immunization: Secondary | ICD-10-CM | POA: Diagnosis not present

## 2012-01-18 DIAGNOSIS — Z992 Dependence on renal dialysis: Secondary | ICD-10-CM | POA: Diagnosis not present

## 2012-01-20 DIAGNOSIS — N2581 Secondary hyperparathyroidism of renal origin: Secondary | ICD-10-CM | POA: Diagnosis not present

## 2012-01-20 DIAGNOSIS — Z992 Dependence on renal dialysis: Secondary | ICD-10-CM | POA: Diagnosis not present

## 2012-01-20 DIAGNOSIS — D509 Iron deficiency anemia, unspecified: Secondary | ICD-10-CM | POA: Diagnosis not present

## 2012-01-20 DIAGNOSIS — Z23 Encounter for immunization: Secondary | ICD-10-CM | POA: Diagnosis not present

## 2012-01-20 DIAGNOSIS — N186 End stage renal disease: Secondary | ICD-10-CM | POA: Diagnosis not present

## 2012-01-23 DIAGNOSIS — Z992 Dependence on renal dialysis: Secondary | ICD-10-CM | POA: Diagnosis not present

## 2012-01-23 DIAGNOSIS — N186 End stage renal disease: Secondary | ICD-10-CM | POA: Diagnosis not present

## 2012-01-23 DIAGNOSIS — N2581 Secondary hyperparathyroidism of renal origin: Secondary | ICD-10-CM | POA: Diagnosis not present

## 2012-01-23 DIAGNOSIS — D509 Iron deficiency anemia, unspecified: Secondary | ICD-10-CM | POA: Diagnosis not present

## 2012-01-23 DIAGNOSIS — Z23 Encounter for immunization: Secondary | ICD-10-CM | POA: Diagnosis not present

## 2012-01-25 DIAGNOSIS — N186 End stage renal disease: Secondary | ICD-10-CM | POA: Diagnosis not present

## 2012-01-25 DIAGNOSIS — Z23 Encounter for immunization: Secondary | ICD-10-CM | POA: Diagnosis not present

## 2012-01-25 DIAGNOSIS — Z992 Dependence on renal dialysis: Secondary | ICD-10-CM | POA: Diagnosis not present

## 2012-01-25 DIAGNOSIS — N2581 Secondary hyperparathyroidism of renal origin: Secondary | ICD-10-CM | POA: Diagnosis not present

## 2012-01-25 DIAGNOSIS — D509 Iron deficiency anemia, unspecified: Secondary | ICD-10-CM | POA: Diagnosis not present

## 2012-01-27 DIAGNOSIS — Z23 Encounter for immunization: Secondary | ICD-10-CM | POA: Diagnosis not present

## 2012-01-27 DIAGNOSIS — N2581 Secondary hyperparathyroidism of renal origin: Secondary | ICD-10-CM | POA: Diagnosis not present

## 2012-01-27 DIAGNOSIS — D509 Iron deficiency anemia, unspecified: Secondary | ICD-10-CM | POA: Diagnosis not present

## 2012-01-27 DIAGNOSIS — N186 End stage renal disease: Secondary | ICD-10-CM | POA: Diagnosis not present

## 2012-01-27 DIAGNOSIS — Z992 Dependence on renal dialysis: Secondary | ICD-10-CM | POA: Diagnosis not present

## 2012-01-30 DIAGNOSIS — Z23 Encounter for immunization: Secondary | ICD-10-CM | POA: Diagnosis not present

## 2012-01-30 DIAGNOSIS — N186 End stage renal disease: Secondary | ICD-10-CM | POA: Diagnosis not present

## 2012-01-30 DIAGNOSIS — Z992 Dependence on renal dialysis: Secondary | ICD-10-CM | POA: Diagnosis not present

## 2012-01-30 DIAGNOSIS — D509 Iron deficiency anemia, unspecified: Secondary | ICD-10-CM | POA: Diagnosis not present

## 2012-01-30 DIAGNOSIS — N2581 Secondary hyperparathyroidism of renal origin: Secondary | ICD-10-CM | POA: Diagnosis not present

## 2012-02-01 DIAGNOSIS — N186 End stage renal disease: Secondary | ICD-10-CM | POA: Diagnosis not present

## 2012-02-01 DIAGNOSIS — Z23 Encounter for immunization: Secondary | ICD-10-CM | POA: Diagnosis not present

## 2012-02-01 DIAGNOSIS — N2581 Secondary hyperparathyroidism of renal origin: Secondary | ICD-10-CM | POA: Diagnosis not present

## 2012-02-01 DIAGNOSIS — D509 Iron deficiency anemia, unspecified: Secondary | ICD-10-CM | POA: Diagnosis not present

## 2012-02-01 DIAGNOSIS — Z992 Dependence on renal dialysis: Secondary | ICD-10-CM | POA: Diagnosis not present

## 2012-02-03 DIAGNOSIS — Z992 Dependence on renal dialysis: Secondary | ICD-10-CM | POA: Diagnosis not present

## 2012-02-03 DIAGNOSIS — N186 End stage renal disease: Secondary | ICD-10-CM | POA: Diagnosis not present

## 2012-02-03 DIAGNOSIS — Z23 Encounter for immunization: Secondary | ICD-10-CM | POA: Diagnosis not present

## 2012-02-03 DIAGNOSIS — D509 Iron deficiency anemia, unspecified: Secondary | ICD-10-CM | POA: Diagnosis not present

## 2012-02-03 DIAGNOSIS — N2581 Secondary hyperparathyroidism of renal origin: Secondary | ICD-10-CM | POA: Diagnosis not present

## 2012-02-06 DIAGNOSIS — N2581 Secondary hyperparathyroidism of renal origin: Secondary | ICD-10-CM | POA: Diagnosis not present

## 2012-02-06 DIAGNOSIS — N186 End stage renal disease: Secondary | ICD-10-CM | POA: Diagnosis not present

## 2012-02-06 DIAGNOSIS — Z23 Encounter for immunization: Secondary | ICD-10-CM | POA: Diagnosis not present

## 2012-02-06 DIAGNOSIS — Z992 Dependence on renal dialysis: Secondary | ICD-10-CM | POA: Diagnosis not present

## 2012-02-06 DIAGNOSIS — D509 Iron deficiency anemia, unspecified: Secondary | ICD-10-CM | POA: Diagnosis not present

## 2012-02-08 DIAGNOSIS — N186 End stage renal disease: Secondary | ICD-10-CM | POA: Diagnosis not present

## 2012-02-08 DIAGNOSIS — N2581 Secondary hyperparathyroidism of renal origin: Secondary | ICD-10-CM | POA: Diagnosis not present

## 2012-02-08 DIAGNOSIS — D509 Iron deficiency anemia, unspecified: Secondary | ICD-10-CM | POA: Diagnosis not present

## 2012-02-08 DIAGNOSIS — Z23 Encounter for immunization: Secondary | ICD-10-CM | POA: Diagnosis not present

## 2012-02-08 DIAGNOSIS — Z992 Dependence on renal dialysis: Secondary | ICD-10-CM | POA: Diagnosis not present

## 2012-02-10 DIAGNOSIS — Z992 Dependence on renal dialysis: Secondary | ICD-10-CM | POA: Diagnosis not present

## 2012-02-10 DIAGNOSIS — N186 End stage renal disease: Secondary | ICD-10-CM | POA: Diagnosis not present

## 2012-02-10 DIAGNOSIS — Z23 Encounter for immunization: Secondary | ICD-10-CM | POA: Diagnosis not present

## 2012-02-10 DIAGNOSIS — N2581 Secondary hyperparathyroidism of renal origin: Secondary | ICD-10-CM | POA: Diagnosis not present

## 2012-02-10 DIAGNOSIS — D509 Iron deficiency anemia, unspecified: Secondary | ICD-10-CM | POA: Diagnosis not present

## 2012-02-13 DIAGNOSIS — N2581 Secondary hyperparathyroidism of renal origin: Secondary | ICD-10-CM | POA: Diagnosis not present

## 2012-02-13 DIAGNOSIS — N186 End stage renal disease: Secondary | ICD-10-CM | POA: Diagnosis not present

## 2012-02-13 DIAGNOSIS — Z23 Encounter for immunization: Secondary | ICD-10-CM | POA: Diagnosis not present

## 2012-02-13 DIAGNOSIS — D509 Iron deficiency anemia, unspecified: Secondary | ICD-10-CM | POA: Diagnosis not present

## 2012-02-13 DIAGNOSIS — Z992 Dependence on renal dialysis: Secondary | ICD-10-CM | POA: Diagnosis not present

## 2012-02-15 DIAGNOSIS — Z23 Encounter for immunization: Secondary | ICD-10-CM | POA: Diagnosis not present

## 2012-02-15 DIAGNOSIS — N186 End stage renal disease: Secondary | ICD-10-CM | POA: Diagnosis not present

## 2012-02-15 DIAGNOSIS — Z992 Dependence on renal dialysis: Secondary | ICD-10-CM | POA: Diagnosis not present

## 2012-02-15 DIAGNOSIS — D509 Iron deficiency anemia, unspecified: Secondary | ICD-10-CM | POA: Diagnosis not present

## 2012-02-15 DIAGNOSIS — N2581 Secondary hyperparathyroidism of renal origin: Secondary | ICD-10-CM | POA: Diagnosis not present

## 2012-02-17 DIAGNOSIS — D509 Iron deficiency anemia, unspecified: Secondary | ICD-10-CM | POA: Diagnosis not present

## 2012-02-17 DIAGNOSIS — Z992 Dependence on renal dialysis: Secondary | ICD-10-CM | POA: Diagnosis not present

## 2012-02-17 DIAGNOSIS — Z23 Encounter for immunization: Secondary | ICD-10-CM | POA: Diagnosis not present

## 2012-02-17 DIAGNOSIS — N186 End stage renal disease: Secondary | ICD-10-CM | POA: Diagnosis not present

## 2012-02-17 DIAGNOSIS — N2581 Secondary hyperparathyroidism of renal origin: Secondary | ICD-10-CM | POA: Diagnosis not present

## 2012-02-20 DIAGNOSIS — N2581 Secondary hyperparathyroidism of renal origin: Secondary | ICD-10-CM | POA: Diagnosis not present

## 2012-02-20 DIAGNOSIS — D509 Iron deficiency anemia, unspecified: Secondary | ICD-10-CM | POA: Diagnosis not present

## 2012-02-20 DIAGNOSIS — Z992 Dependence on renal dialysis: Secondary | ICD-10-CM | POA: Diagnosis not present

## 2012-02-20 DIAGNOSIS — N186 End stage renal disease: Secondary | ICD-10-CM | POA: Diagnosis not present

## 2012-02-22 DIAGNOSIS — N186 End stage renal disease: Secondary | ICD-10-CM | POA: Diagnosis not present

## 2012-02-22 DIAGNOSIS — N2581 Secondary hyperparathyroidism of renal origin: Secondary | ICD-10-CM | POA: Diagnosis not present

## 2012-02-22 DIAGNOSIS — Z992 Dependence on renal dialysis: Secondary | ICD-10-CM | POA: Diagnosis not present

## 2012-02-22 DIAGNOSIS — D509 Iron deficiency anemia, unspecified: Secondary | ICD-10-CM | POA: Diagnosis not present

## 2012-02-24 DIAGNOSIS — D509 Iron deficiency anemia, unspecified: Secondary | ICD-10-CM | POA: Diagnosis not present

## 2012-02-24 DIAGNOSIS — N2581 Secondary hyperparathyroidism of renal origin: Secondary | ICD-10-CM | POA: Diagnosis not present

## 2012-02-24 DIAGNOSIS — N186 End stage renal disease: Secondary | ICD-10-CM | POA: Diagnosis not present

## 2012-02-24 DIAGNOSIS — Z992 Dependence on renal dialysis: Secondary | ICD-10-CM | POA: Diagnosis not present

## 2012-02-27 DIAGNOSIS — Z992 Dependence on renal dialysis: Secondary | ICD-10-CM | POA: Diagnosis not present

## 2012-02-27 DIAGNOSIS — D509 Iron deficiency anemia, unspecified: Secondary | ICD-10-CM | POA: Diagnosis not present

## 2012-02-27 DIAGNOSIS — N2581 Secondary hyperparathyroidism of renal origin: Secondary | ICD-10-CM | POA: Diagnosis not present

## 2012-02-27 DIAGNOSIS — N186 End stage renal disease: Secondary | ICD-10-CM | POA: Diagnosis not present

## 2012-02-29 DIAGNOSIS — N186 End stage renal disease: Secondary | ICD-10-CM | POA: Diagnosis not present

## 2012-02-29 DIAGNOSIS — D509 Iron deficiency anemia, unspecified: Secondary | ICD-10-CM | POA: Diagnosis not present

## 2012-02-29 DIAGNOSIS — Z992 Dependence on renal dialysis: Secondary | ICD-10-CM | POA: Diagnosis not present

## 2012-02-29 DIAGNOSIS — N2581 Secondary hyperparathyroidism of renal origin: Secondary | ICD-10-CM | POA: Diagnosis not present

## 2012-03-03 DIAGNOSIS — N2581 Secondary hyperparathyroidism of renal origin: Secondary | ICD-10-CM | POA: Diagnosis not present

## 2012-03-03 DIAGNOSIS — N186 End stage renal disease: Secondary | ICD-10-CM | POA: Diagnosis not present

## 2012-03-03 DIAGNOSIS — Z992 Dependence on renal dialysis: Secondary | ICD-10-CM | POA: Diagnosis not present

## 2012-03-03 DIAGNOSIS — D509 Iron deficiency anemia, unspecified: Secondary | ICD-10-CM | POA: Diagnosis not present

## 2012-03-05 DIAGNOSIS — N186 End stage renal disease: Secondary | ICD-10-CM | POA: Diagnosis not present

## 2012-03-05 DIAGNOSIS — Z992 Dependence on renal dialysis: Secondary | ICD-10-CM | POA: Diagnosis not present

## 2012-03-05 DIAGNOSIS — N2581 Secondary hyperparathyroidism of renal origin: Secondary | ICD-10-CM | POA: Diagnosis not present

## 2012-03-05 DIAGNOSIS — D509 Iron deficiency anemia, unspecified: Secondary | ICD-10-CM | POA: Diagnosis not present

## 2012-03-14 DIAGNOSIS — D509 Iron deficiency anemia, unspecified: Secondary | ICD-10-CM | POA: Diagnosis not present

## 2012-03-21 DIAGNOSIS — Z992 Dependence on renal dialysis: Secondary | ICD-10-CM | POA: Diagnosis not present

## 2012-03-21 DIAGNOSIS — N186 End stage renal disease: Secondary | ICD-10-CM | POA: Diagnosis not present

## 2012-03-21 DIAGNOSIS — N2581 Secondary hyperparathyroidism of renal origin: Secondary | ICD-10-CM | POA: Diagnosis not present

## 2012-03-23 ENCOUNTER — Emergency Department: Payer: Self-pay | Admitting: Emergency Medicine

## 2012-03-23 DIAGNOSIS — N186 End stage renal disease: Secondary | ICD-10-CM | POA: Diagnosis not present

## 2012-03-23 DIAGNOSIS — I10 Essential (primary) hypertension: Secondary | ICD-10-CM | POA: Diagnosis not present

## 2012-03-23 DIAGNOSIS — Z79899 Other long term (current) drug therapy: Secondary | ICD-10-CM | POA: Diagnosis not present

## 2012-03-26 DIAGNOSIS — D509 Iron deficiency anemia, unspecified: Secondary | ICD-10-CM | POA: Diagnosis not present

## 2012-03-26 DIAGNOSIS — N186 End stage renal disease: Secondary | ICD-10-CM | POA: Diagnosis not present

## 2012-03-26 DIAGNOSIS — N2581 Secondary hyperparathyroidism of renal origin: Secondary | ICD-10-CM | POA: Diagnosis not present

## 2012-03-30 DIAGNOSIS — D509 Iron deficiency anemia, unspecified: Secondary | ICD-10-CM | POA: Diagnosis not present

## 2012-04-04 DIAGNOSIS — Z992 Dependence on renal dialysis: Secondary | ICD-10-CM | POA: Diagnosis not present

## 2012-04-06 DIAGNOSIS — N2581 Secondary hyperparathyroidism of renal origin: Secondary | ICD-10-CM | POA: Diagnosis not present

## 2012-04-06 DIAGNOSIS — N186 End stage renal disease: Secondary | ICD-10-CM | POA: Diagnosis not present

## 2012-04-06 DIAGNOSIS — D509 Iron deficiency anemia, unspecified: Secondary | ICD-10-CM | POA: Diagnosis not present

## 2012-04-06 DIAGNOSIS — Z992 Dependence on renal dialysis: Secondary | ICD-10-CM | POA: Diagnosis not present

## 2012-04-09 DIAGNOSIS — D509 Iron deficiency anemia, unspecified: Secondary | ICD-10-CM | POA: Diagnosis not present

## 2012-04-09 DIAGNOSIS — N2581 Secondary hyperparathyroidism of renal origin: Secondary | ICD-10-CM | POA: Diagnosis not present

## 2012-04-09 DIAGNOSIS — N186 End stage renal disease: Secondary | ICD-10-CM | POA: Diagnosis not present

## 2012-04-09 DIAGNOSIS — Z992 Dependence on renal dialysis: Secondary | ICD-10-CM | POA: Diagnosis not present

## 2012-04-11 DIAGNOSIS — N2581 Secondary hyperparathyroidism of renal origin: Secondary | ICD-10-CM | POA: Diagnosis not present

## 2012-04-11 DIAGNOSIS — Z992 Dependence on renal dialysis: Secondary | ICD-10-CM | POA: Diagnosis not present

## 2012-04-11 DIAGNOSIS — N186 End stage renal disease: Secondary | ICD-10-CM | POA: Diagnosis not present

## 2012-04-11 DIAGNOSIS — D509 Iron deficiency anemia, unspecified: Secondary | ICD-10-CM | POA: Diagnosis not present

## 2012-04-13 DIAGNOSIS — N186 End stage renal disease: Secondary | ICD-10-CM | POA: Diagnosis not present

## 2012-04-13 DIAGNOSIS — N2581 Secondary hyperparathyroidism of renal origin: Secondary | ICD-10-CM | POA: Diagnosis not present

## 2012-04-13 DIAGNOSIS — Z992 Dependence on renal dialysis: Secondary | ICD-10-CM | POA: Diagnosis not present

## 2012-04-13 DIAGNOSIS — D509 Iron deficiency anemia, unspecified: Secondary | ICD-10-CM | POA: Diagnosis not present

## 2012-04-16 DIAGNOSIS — N2581 Secondary hyperparathyroidism of renal origin: Secondary | ICD-10-CM | POA: Diagnosis not present

## 2012-04-16 DIAGNOSIS — Z992 Dependence on renal dialysis: Secondary | ICD-10-CM | POA: Diagnosis not present

## 2012-04-16 DIAGNOSIS — N186 End stage renal disease: Secondary | ICD-10-CM | POA: Diagnosis not present

## 2012-04-16 DIAGNOSIS — D509 Iron deficiency anemia, unspecified: Secondary | ICD-10-CM | POA: Diagnosis not present

## 2012-04-17 DIAGNOSIS — N186 End stage renal disease: Secondary | ICD-10-CM | POA: Diagnosis not present

## 2012-04-18 DIAGNOSIS — N186 End stage renal disease: Secondary | ICD-10-CM | POA: Diagnosis not present

## 2012-04-18 DIAGNOSIS — N2581 Secondary hyperparathyroidism of renal origin: Secondary | ICD-10-CM | POA: Diagnosis not present

## 2012-04-18 DIAGNOSIS — Z992 Dependence on renal dialysis: Secondary | ICD-10-CM | POA: Diagnosis not present

## 2012-04-18 DIAGNOSIS — D509 Iron deficiency anemia, unspecified: Secondary | ICD-10-CM | POA: Diagnosis not present

## 2012-04-20 DIAGNOSIS — Z992 Dependence on renal dialysis: Secondary | ICD-10-CM | POA: Diagnosis not present

## 2012-04-20 DIAGNOSIS — N186 End stage renal disease: Secondary | ICD-10-CM | POA: Diagnosis not present

## 2012-04-20 DIAGNOSIS — N2581 Secondary hyperparathyroidism of renal origin: Secondary | ICD-10-CM | POA: Diagnosis not present

## 2012-04-20 DIAGNOSIS — D509 Iron deficiency anemia, unspecified: Secondary | ICD-10-CM | POA: Diagnosis not present

## 2012-04-23 DIAGNOSIS — N186 End stage renal disease: Secondary | ICD-10-CM | POA: Diagnosis not present

## 2012-04-23 DIAGNOSIS — N2581 Secondary hyperparathyroidism of renal origin: Secondary | ICD-10-CM | POA: Diagnosis not present

## 2012-04-23 DIAGNOSIS — Z992 Dependence on renal dialysis: Secondary | ICD-10-CM | POA: Diagnosis not present

## 2012-04-23 DIAGNOSIS — D509 Iron deficiency anemia, unspecified: Secondary | ICD-10-CM | POA: Diagnosis not present

## 2012-04-25 DIAGNOSIS — N186 End stage renal disease: Secondary | ICD-10-CM | POA: Diagnosis not present

## 2012-04-25 DIAGNOSIS — N2581 Secondary hyperparathyroidism of renal origin: Secondary | ICD-10-CM | POA: Diagnosis not present

## 2012-04-25 DIAGNOSIS — D509 Iron deficiency anemia, unspecified: Secondary | ICD-10-CM | POA: Diagnosis not present

## 2012-04-25 DIAGNOSIS — Z992 Dependence on renal dialysis: Secondary | ICD-10-CM | POA: Diagnosis not present

## 2012-04-27 DIAGNOSIS — Z992 Dependence on renal dialysis: Secondary | ICD-10-CM | POA: Diagnosis not present

## 2012-04-27 DIAGNOSIS — D509 Iron deficiency anemia, unspecified: Secondary | ICD-10-CM | POA: Diagnosis not present

## 2012-04-27 DIAGNOSIS — N186 End stage renal disease: Secondary | ICD-10-CM | POA: Diagnosis not present

## 2012-04-27 DIAGNOSIS — N2581 Secondary hyperparathyroidism of renal origin: Secondary | ICD-10-CM | POA: Diagnosis not present

## 2012-04-30 DIAGNOSIS — N2581 Secondary hyperparathyroidism of renal origin: Secondary | ICD-10-CM | POA: Diagnosis not present

## 2012-04-30 DIAGNOSIS — D509 Iron deficiency anemia, unspecified: Secondary | ICD-10-CM | POA: Diagnosis not present

## 2012-04-30 DIAGNOSIS — Z992 Dependence on renal dialysis: Secondary | ICD-10-CM | POA: Diagnosis not present

## 2012-04-30 DIAGNOSIS — N186 End stage renal disease: Secondary | ICD-10-CM | POA: Diagnosis not present

## 2012-05-02 DIAGNOSIS — R7309 Other abnormal glucose: Secondary | ICD-10-CM | POA: Diagnosis not present

## 2012-05-02 DIAGNOSIS — N2581 Secondary hyperparathyroidism of renal origin: Secondary | ICD-10-CM | POA: Diagnosis not present

## 2012-05-02 DIAGNOSIS — D509 Iron deficiency anemia, unspecified: Secondary | ICD-10-CM | POA: Diagnosis not present

## 2012-05-02 DIAGNOSIS — Z992 Dependence on renal dialysis: Secondary | ICD-10-CM | POA: Diagnosis not present

## 2012-05-02 DIAGNOSIS — N186 End stage renal disease: Secondary | ICD-10-CM | POA: Diagnosis not present

## 2012-05-04 DIAGNOSIS — D509 Iron deficiency anemia, unspecified: Secondary | ICD-10-CM | POA: Diagnosis not present

## 2012-05-04 DIAGNOSIS — N186 End stage renal disease: Secondary | ICD-10-CM | POA: Diagnosis not present

## 2012-05-04 DIAGNOSIS — N2581 Secondary hyperparathyroidism of renal origin: Secondary | ICD-10-CM | POA: Diagnosis not present

## 2012-05-04 DIAGNOSIS — Z992 Dependence on renal dialysis: Secondary | ICD-10-CM | POA: Diagnosis not present

## 2012-05-07 DIAGNOSIS — D509 Iron deficiency anemia, unspecified: Secondary | ICD-10-CM | POA: Diagnosis not present

## 2012-05-07 DIAGNOSIS — Z992 Dependence on renal dialysis: Secondary | ICD-10-CM | POA: Diagnosis not present

## 2012-05-07 DIAGNOSIS — N2581 Secondary hyperparathyroidism of renal origin: Secondary | ICD-10-CM | POA: Diagnosis not present

## 2012-05-07 DIAGNOSIS — N186 End stage renal disease: Secondary | ICD-10-CM | POA: Diagnosis not present

## 2012-05-09 DIAGNOSIS — D509 Iron deficiency anemia, unspecified: Secondary | ICD-10-CM | POA: Diagnosis not present

## 2012-05-09 DIAGNOSIS — Z992 Dependence on renal dialysis: Secondary | ICD-10-CM | POA: Diagnosis not present

## 2012-05-09 DIAGNOSIS — N2581 Secondary hyperparathyroidism of renal origin: Secondary | ICD-10-CM | POA: Diagnosis not present

## 2012-05-09 DIAGNOSIS — N186 End stage renal disease: Secondary | ICD-10-CM | POA: Diagnosis not present

## 2012-05-11 DIAGNOSIS — N186 End stage renal disease: Secondary | ICD-10-CM | POA: Diagnosis not present

## 2012-05-11 DIAGNOSIS — D509 Iron deficiency anemia, unspecified: Secondary | ICD-10-CM | POA: Diagnosis not present

## 2012-05-11 DIAGNOSIS — Z992 Dependence on renal dialysis: Secondary | ICD-10-CM | POA: Diagnosis not present

## 2012-05-11 DIAGNOSIS — N2581 Secondary hyperparathyroidism of renal origin: Secondary | ICD-10-CM | POA: Diagnosis not present

## 2012-05-14 DIAGNOSIS — Z992 Dependence on renal dialysis: Secondary | ICD-10-CM | POA: Diagnosis not present

## 2012-05-14 DIAGNOSIS — N186 End stage renal disease: Secondary | ICD-10-CM | POA: Diagnosis not present

## 2012-05-14 DIAGNOSIS — D509 Iron deficiency anemia, unspecified: Secondary | ICD-10-CM | POA: Diagnosis not present

## 2012-05-14 DIAGNOSIS — N2581 Secondary hyperparathyroidism of renal origin: Secondary | ICD-10-CM | POA: Diagnosis not present

## 2012-05-16 DIAGNOSIS — N186 End stage renal disease: Secondary | ICD-10-CM | POA: Diagnosis not present

## 2012-05-16 DIAGNOSIS — D509 Iron deficiency anemia, unspecified: Secondary | ICD-10-CM | POA: Diagnosis not present

## 2012-05-16 DIAGNOSIS — Z992 Dependence on renal dialysis: Secondary | ICD-10-CM | POA: Diagnosis not present

## 2012-05-16 DIAGNOSIS — N2581 Secondary hyperparathyroidism of renal origin: Secondary | ICD-10-CM | POA: Diagnosis not present

## 2012-05-17 DIAGNOSIS — N186 End stage renal disease: Secondary | ICD-10-CM | POA: Diagnosis not present

## 2012-05-18 DIAGNOSIS — N2581 Secondary hyperparathyroidism of renal origin: Secondary | ICD-10-CM | POA: Diagnosis not present

## 2012-05-18 DIAGNOSIS — D509 Iron deficiency anemia, unspecified: Secondary | ICD-10-CM | POA: Diagnosis not present

## 2012-05-18 DIAGNOSIS — N186 End stage renal disease: Secondary | ICD-10-CM | POA: Diagnosis not present

## 2012-05-18 DIAGNOSIS — Z992 Dependence on renal dialysis: Secondary | ICD-10-CM | POA: Diagnosis not present

## 2012-05-21 DIAGNOSIS — Z992 Dependence on renal dialysis: Secondary | ICD-10-CM | POA: Diagnosis not present

## 2012-05-21 DIAGNOSIS — N2581 Secondary hyperparathyroidism of renal origin: Secondary | ICD-10-CM | POA: Diagnosis not present

## 2012-05-21 DIAGNOSIS — D509 Iron deficiency anemia, unspecified: Secondary | ICD-10-CM | POA: Diagnosis not present

## 2012-05-21 DIAGNOSIS — N186 End stage renal disease: Secondary | ICD-10-CM | POA: Diagnosis not present

## 2012-05-23 DIAGNOSIS — D509 Iron deficiency anemia, unspecified: Secondary | ICD-10-CM | POA: Diagnosis not present

## 2012-05-23 DIAGNOSIS — Z992 Dependence on renal dialysis: Secondary | ICD-10-CM | POA: Diagnosis not present

## 2012-05-23 DIAGNOSIS — N2581 Secondary hyperparathyroidism of renal origin: Secondary | ICD-10-CM | POA: Diagnosis not present

## 2012-05-23 DIAGNOSIS — N186 End stage renal disease: Secondary | ICD-10-CM | POA: Diagnosis not present

## 2012-05-24 DIAGNOSIS — I10 Essential (primary) hypertension: Secondary | ICD-10-CM | POA: Diagnosis not present

## 2012-05-24 DIAGNOSIS — N186 End stage renal disease: Secondary | ICD-10-CM | POA: Diagnosis not present

## 2012-05-24 DIAGNOSIS — T82898A Other specified complication of vascular prosthetic devices, implants and grafts, initial encounter: Secondary | ICD-10-CM | POA: Diagnosis not present

## 2012-05-25 ENCOUNTER — Ambulatory Visit: Payer: Self-pay | Admitting: Vascular Surgery

## 2012-05-25 DIAGNOSIS — T82898A Other specified complication of vascular prosthetic devices, implants and grafts, initial encounter: Secondary | ICD-10-CM | POA: Diagnosis not present

## 2012-05-25 DIAGNOSIS — N186 End stage renal disease: Secondary | ICD-10-CM | POA: Diagnosis not present

## 2012-05-25 DIAGNOSIS — I120 Hypertensive chronic kidney disease with stage 5 chronic kidney disease or end stage renal disease: Secondary | ICD-10-CM | POA: Diagnosis not present

## 2012-05-26 DIAGNOSIS — N2581 Secondary hyperparathyroidism of renal origin: Secondary | ICD-10-CM | POA: Diagnosis not present

## 2012-05-26 DIAGNOSIS — D509 Iron deficiency anemia, unspecified: Secondary | ICD-10-CM | POA: Diagnosis not present

## 2012-05-26 DIAGNOSIS — Z992 Dependence on renal dialysis: Secondary | ICD-10-CM | POA: Diagnosis not present

## 2012-05-26 DIAGNOSIS — N186 End stage renal disease: Secondary | ICD-10-CM | POA: Diagnosis not present

## 2012-05-28 DIAGNOSIS — N2581 Secondary hyperparathyroidism of renal origin: Secondary | ICD-10-CM | POA: Diagnosis not present

## 2012-05-28 DIAGNOSIS — Z992 Dependence on renal dialysis: Secondary | ICD-10-CM | POA: Diagnosis not present

## 2012-05-28 DIAGNOSIS — D509 Iron deficiency anemia, unspecified: Secondary | ICD-10-CM | POA: Diagnosis not present

## 2012-05-28 DIAGNOSIS — N186 End stage renal disease: Secondary | ICD-10-CM | POA: Diagnosis not present

## 2012-05-30 DIAGNOSIS — Z992 Dependence on renal dialysis: Secondary | ICD-10-CM | POA: Diagnosis not present

## 2012-05-30 DIAGNOSIS — N2581 Secondary hyperparathyroidism of renal origin: Secondary | ICD-10-CM | POA: Diagnosis not present

## 2012-05-30 DIAGNOSIS — D509 Iron deficiency anemia, unspecified: Secondary | ICD-10-CM | POA: Diagnosis not present

## 2012-05-30 DIAGNOSIS — N186 End stage renal disease: Secondary | ICD-10-CM | POA: Diagnosis not present

## 2012-06-01 DIAGNOSIS — Z992 Dependence on renal dialysis: Secondary | ICD-10-CM | POA: Diagnosis not present

## 2012-06-01 DIAGNOSIS — N186 End stage renal disease: Secondary | ICD-10-CM | POA: Diagnosis not present

## 2012-06-01 DIAGNOSIS — D509 Iron deficiency anemia, unspecified: Secondary | ICD-10-CM | POA: Diagnosis not present

## 2012-06-01 DIAGNOSIS — N2581 Secondary hyperparathyroidism of renal origin: Secondary | ICD-10-CM | POA: Diagnosis not present

## 2012-06-06 ENCOUNTER — Inpatient Hospital Stay (HOSPITAL_COMMUNITY)
Admission: EM | Admit: 2012-06-06 | Discharge: 2012-06-09 | DRG: 100 | Disposition: A | Discharge status: Home / Self Care | Payer: Medicare Other | Attending: Internal Medicine | Admitting: Internal Medicine

## 2012-06-06 ENCOUNTER — Emergency Department (HOSPITAL_COMMUNITY): Payer: Medicare Other

## 2012-06-06 ENCOUNTER — Encounter (HOSPITAL_COMMUNITY): Payer: Self-pay | Admitting: *Deleted

## 2012-06-06 DIAGNOSIS — Z6841 Body Mass Index (BMI) 40.0 and over, adult: Secondary | ICD-10-CM | POA: Diagnosis not present

## 2012-06-06 DIAGNOSIS — Z992 Dependence on renal dialysis: Secondary | ICD-10-CM | POA: Diagnosis not present

## 2012-06-06 DIAGNOSIS — S8990XA Unspecified injury of unspecified lower leg, initial encounter: Secondary | ICD-10-CM | POA: Diagnosis not present

## 2012-06-06 DIAGNOSIS — T82898A Other specified complication of vascular prosthetic devices, implants and grafts, initial encounter: Secondary | ICD-10-CM | POA: Diagnosis not present

## 2012-06-06 DIAGNOSIS — S99919A Unspecified injury of unspecified ankle, initial encounter: Secondary | ICD-10-CM | POA: Diagnosis not present

## 2012-06-06 DIAGNOSIS — N19 Unspecified kidney failure: Secondary | ICD-10-CM | POA: Diagnosis not present

## 2012-06-06 DIAGNOSIS — R55 Syncope and collapse: Secondary | ICD-10-CM | POA: Diagnosis not present

## 2012-06-06 DIAGNOSIS — N2581 Secondary hyperparathyroidism of renal origin: Secondary | ICD-10-CM | POA: Diagnosis present

## 2012-06-06 DIAGNOSIS — Y841 Kidney dialysis as the cause of abnormal reaction of the patient, or of later complication, without mention of misadventure at the time of the procedure: Secondary | ICD-10-CM | POA: Diagnosis present

## 2012-06-06 DIAGNOSIS — F17200 Nicotine dependence, unspecified, uncomplicated: Secondary | ICD-10-CM | POA: Diagnosis present

## 2012-06-06 DIAGNOSIS — I10 Essential (primary) hypertension: Secondary | ICD-10-CM | POA: Diagnosis present

## 2012-06-06 DIAGNOSIS — S0993XA Unspecified injury of face, initial encounter: Secondary | ICD-10-CM | POA: Diagnosis not present

## 2012-06-06 DIAGNOSIS — M25569 Pain in unspecified knee: Secondary | ICD-10-CM | POA: Diagnosis not present

## 2012-06-06 DIAGNOSIS — R569 Unspecified convulsions: Secondary | ICD-10-CM | POA: Diagnosis not present

## 2012-06-06 DIAGNOSIS — I120 Hypertensive chronic kidney disease with stage 5 chronic kidney disease or end stage renal disease: Secondary | ICD-10-CM | POA: Diagnosis not present

## 2012-06-06 DIAGNOSIS — I770 Arteriovenous fistula, acquired: Secondary | ICD-10-CM | POA: Diagnosis not present

## 2012-06-06 DIAGNOSIS — N186 End stage renal disease: Secondary | ICD-10-CM | POA: Diagnosis present

## 2012-06-06 DIAGNOSIS — S199XXA Unspecified injury of neck, initial encounter: Secondary | ICD-10-CM | POA: Diagnosis not present

## 2012-06-06 DIAGNOSIS — D638 Anemia in other chronic diseases classified elsewhere: Secondary | ICD-10-CM | POA: Diagnosis present

## 2012-06-06 DIAGNOSIS — S0990XA Unspecified injury of head, initial encounter: Secondary | ICD-10-CM | POA: Diagnosis not present

## 2012-06-06 DIAGNOSIS — T82598A Other mechanical complication of other cardiac and vascular devices and implants, initial encounter: Secondary | ICD-10-CM | POA: Diagnosis present

## 2012-06-06 DIAGNOSIS — E669 Obesity, unspecified: Secondary | ICD-10-CM | POA: Diagnosis present

## 2012-06-06 DIAGNOSIS — M25579 Pain in unspecified ankle and joints of unspecified foot: Secondary | ICD-10-CM | POA: Diagnosis not present

## 2012-06-06 DIAGNOSIS — S99929A Unspecified injury of unspecified foot, initial encounter: Secondary | ICD-10-CM | POA: Diagnosis not present

## 2012-06-06 DIAGNOSIS — D539 Nutritional anemia, unspecified: Secondary | ICD-10-CM | POA: Diagnosis present

## 2012-06-06 DIAGNOSIS — Z4901 Encounter for fitting and adjustment of extracorporeal dialysis catheter: Secondary | ICD-10-CM | POA: Diagnosis not present

## 2012-06-06 DIAGNOSIS — R0602 Shortness of breath: Secondary | ICD-10-CM | POA: Diagnosis not present

## 2012-06-06 DIAGNOSIS — Y9210 Unspecified residential institution as the place of occurrence of the external cause: Secondary | ICD-10-CM | POA: Diagnosis present

## 2012-06-06 LAB — RENAL FUNCTION PANEL
Albumin: 2.9 g/dL — ABNORMAL LOW (ref 3.5–5.2)
CO2: 39 mEq/L — ABNORMAL HIGH (ref 19–32)
Chloride: 90 mEq/L — ABNORMAL LOW (ref 96–112)
GFR calc Af Amer: 3 mL/min — ABNORMAL LOW (ref >90)
GFR calc non Af Amer: 2 mL/min — ABNORMAL LOW (ref >90)
Potassium: 4.5 mEq/L (ref 3.5–5.1)

## 2012-06-06 LAB — BASIC METABOLIC PANEL
BUN: 66 mg/dL — ABNORMAL HIGH (ref 6–23)
Chloride: 86 mEq/L — ABNORMAL LOW (ref 96–112)
GFR calc Af Amer: 3 mL/min — ABNORMAL LOW (ref >90)
GFR calc non Af Amer: 2 mL/min — ABNORMAL LOW (ref >90)
Potassium: 5.1 mEq/L (ref 3.5–5.1)
Sodium: 143 mEq/L (ref 135–145)

## 2012-06-06 LAB — CBC
HCT: 36.1 % — ABNORMAL LOW (ref 39.0–52.0)
Hemoglobin: 11.7 g/dL — ABNORMAL LOW (ref 13.0–17.0)
MCH: 33.1 pg (ref 26.0–34.0)
MCHC: 32.4 g/dL (ref 30.0–36.0)
MCV: 102 fL — ABNORMAL HIGH (ref 78.0–100.0)
Platelets: 267 K/uL (ref 150–400)
RBC: 3.54 MIL/uL — ABNORMAL LOW (ref 4.22–5.81)
RDW: 17.4 % — ABNORMAL HIGH (ref 11.5–15.5)
WBC: 7.7 K/uL (ref 4.0–10.5)

## 2012-06-06 LAB — MRSA PCR SCREENING: MRSA by PCR: NEGATIVE

## 2012-06-06 LAB — GLUCOSE, CAPILLARY: Glucose-Capillary: 79 mg/dL (ref 70–99)

## 2012-06-06 LAB — RETICULOCYTES
RBC.: 4.01 MIL/uL — ABNORMAL LOW (ref 4.22–5.81)
Retic Count, Absolute: 64.2 10*3/uL (ref 19.0–186.0)

## 2012-06-06 MED ORDER — CINACALCET HCL 30 MG PO TABS
60.0000 mg | ORAL_TABLET | Freq: Two times a day (BID) | ORAL | Status: DC
  Administered 2012-06-07 – 2012-06-09 (×2): 60 mg via ORAL
  Filled 2012-06-06 (×7): qty 2

## 2012-06-06 MED ORDER — RENA-VITE PO TABS
1.0000 | ORAL_TABLET | Freq: Every day | ORAL | Status: DC
  Administered 2012-06-06 – 2012-06-09 (×2): 1 via ORAL
  Filled 2012-06-06 (×4): qty 1

## 2012-06-06 MED ORDER — CINACALCET HCL 30 MG PO TABS
120.0000 mg | ORAL_TABLET | Freq: Every day | ORAL | Status: DC
  Administered 2012-06-06: 60 mg via ORAL
  Filled 2012-06-06: qty 4

## 2012-06-06 MED ORDER — ENOXAPARIN SODIUM 30 MG/0.3ML ~~LOC~~ SOLN
30.0000 mg | SUBCUTANEOUS | Status: DC
  Administered 2012-06-06 – 2012-06-08 (×3): 30 mg via SUBCUTANEOUS
  Filled 2012-06-06 (×4): qty 0.3

## 2012-06-06 MED ORDER — RENA-VITE PO TABS
1.0000 | ORAL_TABLET | Freq: Every day | ORAL | Status: DC
  Administered 2012-06-07 – 2012-06-08 (×2): 1 via ORAL
  Filled 2012-06-06 (×4): qty 1

## 2012-06-06 MED ORDER — ACETAMINOPHEN 325 MG PO TABS
650.0000 mg | ORAL_TABLET | Freq: Four times a day (QID) | ORAL | Status: DC | PRN
  Administered 2012-06-07 – 2012-06-09 (×4): 650 mg via ORAL
  Filled 2012-06-06 (×4): qty 2

## 2012-06-06 MED ORDER — SEVELAMER CARBONATE 800 MG PO TABS
1600.0000 mg | ORAL_TABLET | ORAL | Status: DC | PRN
  Filled 2012-06-06: qty 2

## 2012-06-06 MED ORDER — SODIUM CHLORIDE 0.9 % IV SOLN: INTRAVENOUS | Status: DC

## 2012-06-06 MED ORDER — SODIUM CHLORIDE 0.9 % IJ SOLN
3.0000 mL | Freq: Two times a day (BID) | INTRAMUSCULAR | Status: DC
  Administered 2012-06-06 – 2012-06-08 (×3): 3 mL via INTRAVENOUS

## 2012-06-06 MED ORDER — ONDANSETRON HCL 4 MG/2ML IJ SOLN: 4.0000 mg | Freq: Four times a day (QID) | INTRAMUSCULAR | Status: DC | PRN

## 2012-06-06 MED ORDER — ALPRAZOLAM 0.5 MG PO TABS
2.0000 mg | ORAL_TABLET | Freq: Every evening | ORAL | Status: DC | PRN
  Administered 2012-06-06 – 2012-06-08 (×3): 2 mg via ORAL
  Filled 2012-06-06 (×4): qty 4

## 2012-06-06 MED ORDER — METOPROLOL TARTRATE 25 MG PO TABS
25.0000 mg | ORAL_TABLET | Freq: Every day | ORAL | Status: DC
  Administered 2012-06-07: 25 mg via ORAL
  Filled 2012-06-06 (×3): qty 1

## 2012-06-06 MED ORDER — LORAZEPAM 2 MG/ML IJ SOLN: 1.0000 mg | INTRAMUSCULAR | Status: DC | PRN

## 2012-06-06 MED ORDER — LORAZEPAM 2 MG/ML IJ SOLN
1.0000 mg | INTRAMUSCULAR | Status: DC | PRN
  Administered 2012-06-06: 1 mg via INTRAVENOUS

## 2012-06-06 MED ORDER — SEVELAMER CARBONATE 800 MG PO TABS
2400.0000 mg | ORAL_TABLET | Freq: Three times a day (TID) | ORAL | Status: DC
  Administered 2012-06-07 – 2012-06-09 (×2): 2400 mg via ORAL
  Filled 2012-06-06 (×10): qty 3

## 2012-06-06 MED ORDER — SEVELAMER CARBONATE 800 MG PO TABS
2400.0000 mg | ORAL_TABLET | Freq: Three times a day (TID) | ORAL | Status: DC
  Administered 2012-06-06: 2400 mg via ORAL
  Filled 2012-06-06 (×8): qty 3

## 2012-06-06 MED ORDER — DOXERCALCIFEROL 4 MCG/2ML IV SOLN
1.5000 ug | INTRAVENOUS | Status: DC
  Administered 2012-06-06: 1.5 ug via INTRAVENOUS
  Filled 2012-06-06: qty 2

## 2012-06-06 MED ORDER — NICOTINE 14 MG/24HR TD PT24
14.0000 mg | MEDICATED_PATCH | Freq: Every day | TRANSDERMAL | Status: DC
  Administered 2012-06-06 – 2012-06-09 (×4): 14 mg via TRANSDERMAL
  Filled 2012-06-06 (×4): qty 1

## 2012-06-06 MED ORDER — METOPROLOL SUCCINATE ER 25 MG PO TB24
25.0000 mg | ORAL_TABLET | Freq: Every day | ORAL | Status: DC
  Administered 2012-06-06 – 2012-06-08 (×2): 25 mg via ORAL
  Filled 2012-06-06 (×4): qty 1

## 2012-06-06 NOTE — Consult Note (Signed)
[facility] Renal Consultation Note    Indication for Consultation:  Management of ESRD/hemodialysis; anemia, hypertension/volume and secondary hyperparathyroidism  HPI: Perry [name] is a 42 y.o. male ESRD patient (MWF/ [facility] in [place]) who was transferred from the [facility] ED where he'd been transported via EMS  after suffering a witnessed seizure at home. He fell in the bathroom, hitting his head and right lower extremity. EMS administered ativan and he was awake and conversant upon arrival  to [facility], per notes. He was noted to have myoclonus at that time and he had not had dialysis since Friday 5/16 secondary to problems with his fistula.  (He states that he is normally compliant with his treatment regimen. He was subsequently transferred to [facility] for insertion of a dialysis catheter, emergent HD and a fistulogram. His  K+ is 5.1 with BUN of 66. CT head/neck and plain films of right knee and ankle are negative for any acute process. He does have asterixis at this time but complains only of pain to right knee and ankle.  He denies HA, dizziness, SOB, N/V/D or paresthesias. Mental status appear to be at baseline (girlfriend at bedside assisting with history.)  Past Medical History  Diagnosis Date  . Dialysis patient   . Kidney failure    Past Surgical History  Procedure Laterality Date  . Av fistula repair     No family history on file. Social History:  reports that he has been smoking.  He does not have any smokeless tobacco history on file. He reports that he does not drink alcohol or use illicit drugs. No Known Allergies Prior to Admission medications   Medication Sig Start Date End Date Taking? Authorizing Provider  acetaminophen (TYLENOL) 500 MG tablet Take 1,000 mg by mouth every 6 (six) hours as needed for pain.   Yes Historical Provider, MD  alprazolam Prudy Feeler) 2 MG tablet Take 2 mg by mouth at bedtime.    Yes Historical Provider, MD  cinacalcet  (SENSIPAR) 60 MG tablet Take 60 mg by mouth 2 (two) times daily.   Yes Historical Provider, MD  metoprolol succinate (TOPROL-XL) 25 MG 24 hr tablet Take 25 mg by mouth at bedtime.    Yes Historical Provider, MD  multivitamin (RENA-VIT) TABS tablet Take 1 tablet by mouth daily.   Yes Historical Provider, MD  sevelamer (RENVELA) 800 MG tablet Take 2,400 mg by mouth 3 (three) times daily with meals.   Yes Historical Provider, MD   Current Facility-Administered Medications  Medication Dose Route Frequency Provider Last Rate Last Dose  . LORazepam (ATIVAN) injection 1 mg  1 mg Intravenous Q4H PRN Zannie Cove, MD       Labs: Basic Metabolic Panel:  Recent Labs Lab 06/06/12 0611  NA 143  K 5.1  CL 86*  CO2 41*  GLUCOSE 86  BUN 66*  CREATININE 21.81*  CALCIUM 8.3*   CBC:  Recent Labs Lab 06/06/12 0611  WBC 7.7  HGB 11.7*  HCT 36.1*  MCV 102.0*  PLT 267   Cardiac Enzymes: No results found for this basename: CKTOTAL, CKMB, CKMBINDEX, TROPONINI,  in the last 168 hours CBG:  Recent Labs Lab 06/06/12 0543  GLUCAP 79   INR: @labcntip (inr:3)@ Iron Studies: No results found for this basename: IRON, TIBC, TRANSFERRIN, FERRITIN,  in the last 72 hours Studies/Results: Dg Chest 1 View  06/06/2012   *RADIOLOGY REPORT*  Clinical Data: Syncope.  Shortness of breath.  CHEST - 1 VIEW  Comparison: None.  Findings: Heart size and pulmonary vascularity are normal and the lungs are clear.  No osseous abnormality.  IMPRESSION: Normal chest.   Original Report Authenticated By: Francene Boyers, M.D.   Dg Ankle Complete Right  06/06/2012   *RADIOLOGY REPORT*  Clinical Data: Right ankle pain secondary to a fall.  RIGHT ANKLE - COMPLETE 3+ VIEW  Comparison: None.  Findings: There are tiny avulsions from the medial aspect of the talus which I suspect are old.  Minimal osteophytes on the distal tibia.  No acute fracture or dislocation.  Tiny ankle effusion.  Slight dorsal spurring at the  talonavicular joint.  IMPRESSION: Tiny joint effusion.  No acute osseous abnormality.   Original Report Authenticated By: Francene Boyers, M.D.   Ct Head Wo Contrast  06/06/2012   *RADIOLOGY REPORT*  Clinical Data: The patient fell and struck his head.  Seizure.  CT HEAD WITHOUT CONTRAST  Technique:  Contiguous axial images were obtained from the base of the skull through the vertex without contrast.  Comparison: None.  Findings: There is no acute intracranial hemorrhage, infarction, or mass lesion.  Brain parenchyma is normal.  Osseous structures are normal.  IMPRESSION: Normal exam.   Original Report Authenticated By: Francene Boyers, M.D.   Ct Cervical Spine Wo Contrast  06/06/2012   *RADIOLOGY REPORT*  Clinical Data: Syncope with a fall and head trauma.  Seizure-like activity.  CT CERVICAL SPINE WITHOUT CONTRAST  Technique:  Multidetector CT imaging of the cervical spine was performed. Multiplanar CT image reconstructions were also generated.  Comparison: None.  Findings: There is no fracture, subluxation or prevertebral soft tissue swelling.  Minimal disc space narrowing at C5-6, with slight anterior osteophytes.  Soft tissues are normal.  IMPRESSION: No acute abnormality of the cervical spine.   Original Report Authenticated By: Francene Boyers, M.D.   Dg Knee Complete 4 Views Right  06/06/2012   *RADIOLOGY REPORT*  Clinical Data: Right knee pain secondary to a fall.  RIGHT KNEE - COMPLETE 4+ VIEW  Comparison: 01/06/2012  Findings: There is no fracture, dislocation, or joint effusion.  No arthritis.  IMPRESSION: Normal exam.   Original Report Authenticated By: Francene Boyers, M.D.    ROS: Rt knee/ankle pain. Tremors. All other systems negative except as described above.   Physical Exam: Filed Vitals:   06/06/12 0908 06/06/12 1042 06/06/12 1245 06/06/12 1318  BP: 139/78 176/108 135/86 156/102  Pulse: 89 96 92 90  Temp: 98.2 F (36.8 C) 98.3 F (36.8 C) 98 F (36.7 C)   TempSrc: Oral Oral Oral    Resp: 18 18 18    Height:  5\' 6"  (1.676 m)    Weight:  129.865 kg (286 lb 4.8 oz)    SpO2: 97% 96%       General: Alert, cooperative, in no acute distress. Head: Normocephalic, atraumatic, sclera non-icteric, mucus membranes are moist Neck: Supple. JVD elevated. Lungs: Diffuse crackles bilaterally. Breathing is unlabored. Heart: Tachy regular with S1 S2. No murmurs, rubs, or gallops appreciated. Abdomen: Obese, soft, non-tender, non-distended with normoactive bowel sounds. No rebound/guarding. No obvious abdominal masses. M-S:  Strength and tone appear normal for age. Lower extremities: +pedal edema, R > L. No ischemic changes, no open wounds  Neuro: +Asterixis. Alert and oriented X 3. Moves all extremities spontaneously. Psych:  Responds to questions appropriately with a normal affect. Dialysis Access: LFA AVF buttonhole  with +bruit distally but none proximally  Dialysis Orders: Center: [facility] in / MWF   Time 3:75. EDW 121 kg 2K/2.5Ca Bath  LFA AVF BFR 500/ DFR 800. 15g needles. Heparin 4000 bolus/ 2700 mid. Hectorol 1.5 mcg IV/HD  Assessment/Plan: 1. Witnessed Seizure - Presumed secondary to missed hemodialysis per admit. No history of seizures. Resolved with Ativan. 2. ESRD/ Dialysis Access -  MWF - Last full treatment was 5/16. Ongoing problems with LFA AVF x 2 weeks. Pt states that when he was unable to run, he was told to "come back the next day and they would try again". He is seeking a transfer from [facility] in [place] to [facility] in [place] as it is closer to home. Will coordinate with SW. + Asterixis on PE. Temp HD cath placed by Dr. Arlean Hopping. Emergent HD now and Fistulogram tomorrow. K+ 5.1 3. Hypertension/volume  - SBPs 130's-170s. Home metoprolol 25 mg QD.  Here, he is ~9 kg up by wgts with +LE edema, crackles. CXR clear.  Emergent HD with UF 6L as tolerated. 4. Anemia  - Hgb 11.7. Hold ESAs for now. 5. Metabolic bone disease -  Ca wnl. Renal panel pending.  Continue home dose Renvela 3 meals/2 snacks, Sensipar 120 mg. Hectorol 1.5 mcg 6. Nutrition -  High protein renal diet. NPO after MN pending fistulogram in the am. Multivitamin  Claud Kelp, PA-C [facility] Pager [phone] 06/06/2012, 1:28 PM   Patient seen and examined.  Agree with assessment and plan as above.  Patient has ESRD presumably due to HTN on HD since 2011. Presenting with AVF problems x 2 weeks, no dialysis since last Friday and had a seizure at home this morning.  Has notable asterixis but no confusion.  Creatinine is very high.  BUN is not high but this is likely due to poor oral intake. Needs emergent HD, not stable for fistulogram, will place temp HD cath for acute HD.  Will have fistulogram to assess AVF when clinically stable, possibly tomorrow.    Vinson Moselle  MD [phone] pgr    [phone] cell 06/06/2012, 1:30 PM

## 2012-06-06 NOTE — H&P (Signed)
HPI: Perry [name] is an 42 y.o. male with ESRD on HD, had been having issues with his AVF for 2 weeks, he had fistulogram and intervention by a vascular surgeon in [place] 2 weeks ago, subsequently had 3 HD treatments but then since 5/15 could not have HD again due to malfunctioning AVF. He was supposed to have a Fistulogram at [facility] today but overnight /early this am had a seizure -tonic clonic with fall and LoC witnessed by girlfriend. He was taken to [facility] ER by EMS, given Ativan en route.  H/o biting his lip with scant bleeding, had a CT Head at [facility] which was normal. Since being admitted, pt has had femoral temp cath placed and is on HD.  Pt seen in HD, normal mentation, only c/o is knee/LE pain IR is asked to eval (L)UE AVF by shuntogram tomorrow. Pt familiar with procedure, just not at this facility.  Past Medical History:  Past Medical History  Diagnosis Date  . Dialysis patient   . Kidney failure     Past Surgical History:  Past Surgical History  Procedure Laterality Date  . Av fistula repair      Family History: No family history on file.  Social History:  reports that he has been smoking.  He does not have any smokeless tobacco history on file. He reports that he does not drink alcohol or use illicit drugs.  Allergies: No Known Allergies  Medications: Medications Prior to Admission  Medication Sig Dispense Refill  . acetaminophen (TYLENOL) 500 MG tablet Take 1,000 mg by mouth every 6 (six) hours as needed for pain.      [name] alprazolam (XANAX) 2 MG tablet Take 2 mg by mouth at bedtime.       . cinacalcet (SENSIPAR) 60 MG tablet Take 60 mg by mouth 2 (two) times daily.      . metoprolol succinate (TOPROL-XL) 25 MG 24 hr tablet Take 25 mg by mouth at bedtime.       . multivitamin (RENA-VIT) TABS tablet Take 1 tablet by mouth daily.      . sevelamer (RENVELA) 800 MG tablet Take 2,400 mg by mouth 3 (three) times daily with meals.        Please HPI for pertinent  positives, otherwise complete 10 system ROS negative.  Physical Exam: BP 170/92  Pulse 100  Temp(Src) 98 F (36.7 C) (Oral)  Resp 18  Ht 5\' 6"  (1.676 m)  Wt 286 lb 4.8 oz (129.865 kg)  BMI 46.23 kg/m2  SpO2 96% Body mass index is 46.23 kg/(m^2).   General Appearance:  Alert, cooperative, no distress, appears stated age  Head:  Normocephalic, without obvious abnormality, atraumatic  ENT: Unremarkable airway  Neck: Supple, symmetrical, trachea midline  Lungs:   Clear to auscultation bilaterally, no w/r/r,  Heart:  Regular rate and rhythm, S1, S2 normal, no murmur, rub or gallop.  Extremities: (L)forearm AVF pulse/thrill is easily palpable distally, but not so much proximally, however, an audible thrill/bruit is present in the proximal aspect. Hand warm  Pulses: 2+ and symmetric  Neurologic: Normal affect, no gross deficits.   Results for orders placed during the hospital encounter of 06/06/12 (from the past 48 hour(s))  GLUCOSE, CAPILLARY     Status: None   Collection Time    06/06/12  5:43 AM      Result Value Range   Glucose-Capillary 79  70 - 99 mg/dL  BASIC METABOLIC PANEL     Status: Abnormal  Collection Time    06/06/12  6:11 AM      Result Value Range   Sodium 143  135 - 145 mEq/L   Potassium 5.1  3.5 - 5.1 mEq/L   Chloride 86 (*) 96 - 112 mEq/L   CO2 41 (*) 19 - 32 mEq/L   Comment: CRITICAL RESULT CALLED TO, READ BACK BY AND VERIFIED WITH:     WINN,T AT 6:35AM ON 06/06/12 BY FESTERMAN,C   Glucose, Bld 86  70 - 99 mg/dL   BUN 66 (*) 6 - 23 mg/dL   Creatinine, Ser 16.10 (*) 0.50 - 1.35 mg/dL   Calcium 8.3 (*) 8.4 - 10.5 mg/dL   GFR calc non Af Amer 2 (*) >90 mL/min   GFR calc Af Amer 3 (*) >90 mL/min   Comment:            The eGFR has been calculated     using the CKD EPI equation.     This calculation has not been     validated in all clinical     situations.     eGFR's persistently     <90 mL/min signify     possible Chronic Kidney Disease.  CBC      Status: Abnormal   Collection Time    06/06/12  6:11 AM      Result Value Range   WBC 7.7  4.0 - 10.5 K/uL   RBC 3.54 (*) 4.22 - 5.81 MIL/uL   Hemoglobin 11.7 (*) 13.0 - 17.0 g/dL   HCT 96.0 (*) 45.4 - 09.8 %   MCV 102.0 (*) 78.0 - 100.0 fL   MCH 33.1  26.0 - 34.0 pg   MCHC 32.4  30.0 - 36.0 g/dL   RDW 11.9 (*) 14.7 - 82.9 %   Platelets 267  150 - 400 K/uL  RENAL FUNCTION PANEL     Status: Abnormal   Collection Time    06/06/12  1:00 PM      Result Value Range   Sodium 145  135 - 145 mEq/L   Potassium 4.5  3.5 - 5.1 mEq/L   Chloride 90 (*) 96 - 112 mEq/L   CO2 39 (*) 19 - 32 mEq/L   Glucose, Bld 83  70 - 99 mg/dL   BUN 68 (*) 6 - 23 mg/dL   Creatinine, Ser 56.21 (*) 0.50 - 1.35 mg/dL   Calcium 7.6 (*) 8.4 - 10.5 mg/dL   Phosphorus 5.2 (*) 2.3 - 4.6 mg/dL   Albumin 2.9 (*) 3.5 - 5.2 g/dL   GFR calc non Af Amer 2 (*) >90 mL/min   GFR calc Af Amer 3 (*) >90 mL/min   Comment:            The eGFR has been calculated     using the CKD EPI equation.     This calculation has not been     validated in all clinical     situations.     eGFR's persistently     <90 mL/min signify     possible Chronic Kidney Disease.   Dg Chest 1 View  06/06/2012   *RADIOLOGY REPORT*  Clinical Data: Syncope.  Shortness of breath.  CHEST - 1 VIEW  Comparison: None.  Findings: Heart size and pulmonary vascularity are normal and the lungs are clear.  No osseous abnormality.  IMPRESSION: Normal chest.   Original Report Authenticated By: Francene Boyers, M.D.   Dg Ankle Complete Right  06/06/2012   *RADIOLOGY REPORT*  Clinical Data: Right ankle pain secondary to a fall.  RIGHT ANKLE - COMPLETE 3+ VIEW  Comparison: None.  Findings: There are tiny avulsions from the medial aspect of the talus which I suspect are old.  Minimal osteophytes on the distal tibia.  No acute fracture or dislocation.  Tiny ankle effusion.  Slight dorsal spurring at the talonavicular joint.  IMPRESSION: Tiny joint effusion.  No acute  osseous abnormality.   Original Report Authenticated By: Francene Boyers, M.D.   Ct Head Wo Contrast  06/06/2012   *RADIOLOGY REPORT*  Clinical Data: The patient fell and struck his head.  Seizure.  CT HEAD WITHOUT CONTRAST  Technique:  Contiguous axial images were obtained from the base of the skull through the vertex without contrast.  Comparison: None.  Findings: There is no acute intracranial hemorrhage, infarction, or mass lesion.  Brain parenchyma is normal.  Osseous structures are normal.  IMPRESSION: Normal exam.   Original Report Authenticated By: Francene Boyers, M.D.   Ct Cervical Spine Wo Contrast  06/06/2012   *RADIOLOGY REPORT*  Clinical Data: Syncope with a fall and head trauma.  Seizure-like activity.  CT CERVICAL SPINE WITHOUT CONTRAST  Technique:  Multidetector CT imaging of the cervical spine was performed. Multiplanar CT image reconstructions were also generated.  Comparison: None.  Findings: There is no fracture, subluxation or prevertebral soft tissue swelling.  Minimal disc space narrowing at C5-6, with slight anterior osteophytes.  Soft tissues are normal.  IMPRESSION: No acute abnormality of the cervical spine.   Original Report Authenticated By: Francene Boyers, M.D.   Dg Knee Complete 4 Views Right  06/06/2012   *RADIOLOGY REPORT*  Clinical Data: Right knee pain secondary to a fall.  RIGHT KNEE - COMPLETE 4+ VIEW  Comparison: 01/06/2012  Findings: There is no fracture, dislocation, or joint effusion.  No arthritis.  IMPRESSION: Normal exam.   Original Report Authenticated By: Francene Boyers, M.D.    Assessment/Plan Dysfunctional (L)UE AVF For shuntogram tomorrow and possible intervention if amenable. If not amenable, discussed possibility of placing tunneled HD catheter so that pt can have HD access until he follows up with vascular service. Labs reviewed, will check coags in am Discussed procedure, risks, complications, use of sedation. Consent signed in chart  Brayton El  PA-C 06/06/2012, 4:36 PM

## 2012-06-06 NOTE — Procedures (Signed)
I was present at this dialysis session. I have reviewed the session itself and made appropriate changes.   Vinson Moselle, MD [employer] 06/06/2012, 1:12 PM

## 2012-06-06 NOTE — ED Notes (Signed)
CRITICAL VALUE ALERT  Critical value received:  CO2  Date of notification:  06/06/12  Time of notification:  0635  Critical value read back:yes  Nurse who received alert:  T winn  MD notified (1st page):  Colon Branch  Time of first page:  0635  MD notified (2nd page):  Time of second page:  Responding MD:  strand  Time MD responded:  [phone]

## 2012-06-06 NOTE — Progress Notes (Signed)
Pt arrived to unit from HD. Alert and oriented, no acute distress. Pt with notable twitching to bilateral hands. Suction set up at bedside, placed on bed alarm, signed fall prevention safety plan. Pt with no cx at this time, oriented to call bell and unit. Pt is requesting nicotine patch, states he only takes 60 mg of cinacalcet, refused full dose at this time. Family at bedside. Will continue to monitor.

## 2012-06-06 NOTE — ED Notes (Signed)
Report given to [facility]  

## 2012-06-06 NOTE — Progress Notes (Signed)
I spoke with Dr. Greta Doom from [facility] ED for transfer to [facility].   42 year old gentleman with a history of ESRD who receives dialysis at [facility], but has not had dialysis since Friday, 06/01/2012 because of problems with his fistula. The patient was scheduled for a fistulogram at 12:30 PM today at [facility], but last night/early this morning the patient had a witnessed seizure like activity by his girlfriend/significant other. The patient fell and hit his head and neck. CT of the brain and neck are negative. When EMS arrived, the patient was awake and speaking to them, but he was still "twitching". Therefore, EMS gave the patient 2 mg of Ativan. After arrival to the emergency department, the patient was awake and alert and conversant. However he was noted by Dr. Colon Branch that the patient had myoclonus. Because of concerns for seizure resulting from his ESRD and difficulty with HD access, transferred to [facility] cone was requested for placement of temporary dialysis catheter, fistulogram, and need for dialysis today. The patient was hemodynamically stable. Labs showed potassium 5.1, BUN 66, creatinine 21.8. CBC was unremarkable. The patient was accepted for telemetry bed. Nephrology will need to be consulted and possibly IR consult for temporary dialysis catheter and fistulogram.  DTat

## 2012-06-06 NOTE — ED Notes (Signed)
I called [facility] with rm assignment ([id number]). Nurse informed of rm.

## 2012-06-06 NOTE — ED Provider Notes (Signed)
History     CSN: [id number]  Arrival date & time 06/06/12  7829   First MD Initiated Contact with Patient 06/06/12 0541      Chief Complaint  Patient presents with  . Seizures    (Consider location/radiation/quality/duration/timing/severity/associated sxs/prior treatment) Patient is a 42 y.o. male presenting with seizures.  Seizures  HPI Comments: Perry [name] is a 42 y.o. male  With a h/o ESRD , dialysis M-W-F brought in by ambulance, who presents to the Emergency Department complaining of a seizure. Patient got up to go to the bathroom and fell in the bathroom having a seizure. Patient has not had dialysis due to fistula problems since Friday. He has been on dialysis since 2011. This is the first seizure he has had.EMS gave him 2 mg of ativan to stop seizure like activity when they arrived to transport him.  Patient is scheduled for a fistula-gram at [facility] at 12:30 PM today.   [facility] Past Medical History  Diagnosis Date  . Dialysis patient   . Kidney failure     Past Surgical History  Procedure Laterality Date  . Av fistula repair      No family history on file.  History  Substance Use Topics  . Smoking status: Current Every Day Smoker  . Smokeless tobacco: Not on file  . Alcohol Use: No      Review of Systems  Constitutional: Negative for fever.       10 Systems reviewed and are negative for acute change except as noted in the HPI.  HENT: Negative for congestion.   Eyes: Negative for discharge and redness.  Respiratory: Negative for cough and shortness of breath.   Cardiovascular: Negative for chest pain.  Gastrointestinal: Negative for vomiting and abdominal pain.  Musculoskeletal: Negative for back pain.  Skin: Negative for rash.  Neurological: Positive for seizures. Negative for syncope, numbness and headaches.  Psychiatric/Behavioral:       No behavior change.    Allergies  Review of patient's allergies indicates no known  allergies.  Home Medications   Current Outpatient Rx  Name  Route  Sig  Dispense  Refill  . alprazolam (XANAX) 2 MG tablet   Oral   Take 2 mg by mouth daily.         . cinacalcet (SENSIPAR) 60 MG tablet   Oral   Take 60 mg by mouth 2 (two) times daily.         . metoprolol succinate (TOPROL-XL) 25 MG 24 hr tablet   Oral   Take 25 mg by mouth daily.         . multivitamin (RENA-VIT) TABS tablet   Oral   Take 1 tablet by mouth daily.         . sevelamer (RENVELA) 800 MG tablet   Oral   Take 2,400 mg by mouth 3 (three) times daily with meals.         . cephALEXin (KEFLEX) 500 MG capsule   Oral   Take 1 capsule (500 mg total) by mouth 4 (four) times daily.   40 capsule   0   . oxyCODONE-acetaminophen (PERCOCET/ROXICET) 5-325 MG per tablet   Oral   Take 1-2 tablets by mouth every 6 (six) hours as needed for pain.   20 tablet   0   . oxyCODONE-acetaminophen (PERCOCET/ROXICET) 5-325 MG per tablet   Oral   Take 2 tablets by mouth every 4 (four) hours as needed for pain.   6 tablet  0     There were no vitals taken for this visit.  Physical Exam  Nursing note and vitals reviewed. Constitutional: He is oriented to person, place, and time. He appears well-developed and well-nourished.  Awake, alert, nontoxic appearance.  HENT:  Head: Normocephalic and atraumatic.  Right Ear: External ear normal.  Left Ear: External ear normal.  Mouth/Throat: Oropharynx is clear and moist.  Abrasion to lower lip  Eyes: EOM are normal. Pupils are equal, round, and reactive to light.  Neck: Normal range of motion. Neck supple.  Cardiovascular: Normal rate and intact distal pulses.   Pulmonary/Chest: Effort normal and breath sounds normal. He exhibits no tenderness.  Abdominal: Soft. Bowel sounds are normal. There is no tenderness. There is no rebound.  Musculoskeletal: He exhibits no tenderness.  Baseline ROM, no obvious new focal weakness.Fistula in left lower arm, good  bruit and thrill  Neurological: He is alert and oriented to person, place, and time. No cranial nerve deficit. Coordination normal.  Mental status and motor strength appears baseline for patient and situation.Clonus present in lower extremities with dorsiflexion of the feet. GSC 15. Clonus in upper extremity when holding arms outright and tapping on arms.   Skin: No rash noted.  Psychiatric: He has a normal mood and affect.    ED Course  Procedures (including critical care time) Results for orders placed during the hospital encounter of 06/06/12  BASIC METABOLIC PANEL      Result Value Range   Sodium 143  135 - 145 mEq/L   Potassium 5.1  3.5 - 5.1 mEq/L   Chloride 86 (*) 96 - 112 mEq/L   CO2 41 (*) 19 - 32 mEq/L   Glucose, Bld 86  70 - 99 mg/dL   BUN 66 (*) 6 - 23 mg/dL   Creatinine, Ser 04.54 (*) 0.50 - 1.35 mg/dL   Calcium 8.3 (*) 8.4 - 10.5 mg/dL   GFR calc non Af Amer 2 (*) >90 mL/min   GFR calc Af Amer 3 (*) >90 mL/min  CBC      Result Value Range   WBC 7.7  4.0 - 10.5 K/uL   RBC 3.54 (*) 4.22 - 5.81 MIL/uL   Hemoglobin 11.7 (*) 13.0 - 17.0 g/dL   HCT 09.8 (*) 11.9 - 14.7 %   MCV 102.0 (*) 78.0 - 100.0 fL   MCH 33.1  26.0 - 34.0 pg   MCHC 32.4  30.0 - 36.0 g/dL   RDW 82.9 (*) 56.2 - 13.0 %   Platelets 267  150 - 400 K/uL  GLUCOSE, CAPILLARY      Result Value Range   Glucose-Capillary 79  70 - 99 mg/dL    Date: 86/57/8469  6295  Rate: 87  Rhythm: normal sinus rhythm  QRS Axis: normal  Intervals: normal  ST/T Wave abnormalities: normal  Conduction Disutrbances:none  Narrative Interpretation: septal infarct  Old EKG Reviewed: none available  Dg Chest 1 View  06/06/2012   *RADIOLOGY REPORT*  Clinical Data: Syncope.  Shortness of breath.  CHEST - 1 VIEW  Comparison: None.  Findings: Heart size and pulmonary vascularity are normal and the lungs are clear.  No osseous abnormality.  IMPRESSION: Normal chest.   Original Report Authenticated By: Francene Boyers, M.D.   Dg  Ankle Complete Right  06/06/2012   *RADIOLOGY REPORT*  Clinical Data: Right ankle pain secondary to a fall.  RIGHT ANKLE - COMPLETE 3+ VIEW  Comparison: None.  Findings: There are tiny avulsions from the medial  aspect of the talus which I suspect are old.  Minimal osteophytes on the distal tibia.  No acute fracture or dislocation.  Tiny ankle effusion.  Slight dorsal spurring at the talonavicular joint.  IMPRESSION: Tiny joint effusion.  No acute osseous abnormality.   Original Report Authenticated By: Francene Boyers, M.D.   Ct Head Wo Contrast  06/06/2012   *RADIOLOGY REPORT*  Clinical Data: The patient fell and struck his head.  Seizure.  CT HEAD WITHOUT CONTRAST  Technique:  Contiguous axial images were obtained from the base of the skull through the vertex without contrast.  Comparison: None.  Findings: There is no acute intracranial hemorrhage, infarction, or mass lesion.  Brain parenchyma is normal.  Osseous structures are normal.  IMPRESSION: Normal exam.   Original Report Authenticated By: Francene Boyers, M.D.   Ct Cervical Spine Wo Contrast  06/06/2012   *RADIOLOGY REPORT*  Clinical Data: Syncope with a fall and head trauma.  Seizure-like activity.  CT CERVICAL SPINE WITHOUT CONTRAST  Technique:  Multidetector CT imaging of the cervical spine was performed. Multiplanar CT image reconstructions were also generated.  Comparison: None.  Findings: There is no fracture, subluxation or prevertebral soft tissue swelling.  Minimal disc space narrowing at C5-6, with slight anterior osteophytes.  Soft tissues are normal.  IMPRESSION: No acute abnormality of the cervical spine.   Original Report Authenticated By: Francene Boyers, M.D.   Dg Knee Complete 4 Views Right  06/06/2012   *RADIOLOGY REPORT*  Clinical Data: Right knee pain secondary to a fall.  RIGHT KNEE - COMPLETE 4+ VIEW  Comparison: 01/06/2012  Findings: There is no fracture, dislocation, or joint effusion.  No arthritis.  IMPRESSION: Normal exam.    Original Report Authenticated By: Francene Boyers, M.D.   7:41 AM:  T/C to Dr. Sherrie Mustache, hospitalist, case discussed, including:  HPI, pertinent PM/SHx, VS/PE, dx testing, ED course and treatment. She advised I should call the hospitalist at [facility] directly.  8:22 AM:  T/C to Dr. Arbutus Leas, hospitalist, case discussed, including:  HPI, pertinent PM/SHx, VS/PE, dx testing, ED course and treatment.  Agreeable to admission to [facility].[name]  Requests to write temporary orders, telemetry bed to team 10.  MDM  Patient with ESRD who had a first seizure. Labs reflect his ESRD. Chest xray is normal. CT head and cervical spine normal. Chest xray is normal. Spoke with hospitalist at [facility] who will admit. Pt stable in ED with no significant deterioration in condition.The patient appears reasonably stabilized for admission considering the current resources, flow, and capabilities available in the ED at this time, and I doubt any other [facility] requiring further screening and/or treatment in the ED prior to admission.  MDM Reviewed: nursing note and vitals Interpretation: labs, ECG, x-ray and CT scan         Perry [name]. Colon Branch, MD 06/06/12 [phone]

## 2012-06-06 NOTE — Procedures (Signed)
Under sterile conditions and using US guidance a 20 cm 3-lumen Trialysis hemodialysis catheter was placed into the R common femoral vein without difficulty.  No immediate complications, ready to use.    Vinson Moselle  MD [phone] pgr    [phone] cell 06/06/2012, 1:14 PM

## 2012-06-06 NOTE — H&P (Addendum)
[facility] History and Physical  Perry [name] WUJ:[id number] DOB: [birth year] DOA: 06/06/2012  Referring physician: EDP at [facility] PCP: No primary provider on file.  Specialists: Dr.Latif, Nephrology  Chief Complaint: seizure HPI: DUQUAN [name] is a 42 y.o. male with ESRD on HD, had been having issues with his AVF for 2 weeks, he had fistulogram and intervention by a vascular surgeon in [place] 2 weeks ago, subsequently had 3 HD treatments but then since 5/15 could not have HD again due to malfunctioning AVF. He was supposed to have a Fistulogram at [facility] today but overnight /early this am had a seizure -tonic clonic with fall and LoC witnessed by girlfriend. He was taken to [facility] ER by EMS, given Ativan en route. H/o biting his lip with scant bleeding, had a CT Head at [facility] which was normal. His friened reports that pt has been shaking and jerky since yesterday evening. No changes in meds, no trauma, fevers or chills prior to seizure.   Review of Systems: The patient denies anorexia, fever, weight loss,, vision loss, decreased hearing, hoarseness, chest pain, syncope, dyspnea on exertion, peripheral edema, balance deficits, hemoptysis, abdominal pain, melena, hematochezia, severe indigestion/heartburn, hematuria, incontinence, genital sores, muscle weakness, suspicious skin lesions, transient blindness, difficulty walking, depression, unusual weight change, abnormal bleeding, enlarged lymph nodes, angioedema, and breast masses.    Past Medical History  Diagnosis Date  . Dialysis patient   . Kidney failure    Past Surgical History  Procedure Laterality Date  . Av fistula repair     Social History:  reports that he has been smoking.  He does not have any smokeless tobacco history on file. He reports that he does not drink alcohol or use illicit drugs. Lives at home with girlfriend  No Known Allergies  Family history  H/o HTN  Prior to Admission medications    Medication Sig Start Date End Date Taking? Authorizing Provider  acetaminophen (TYLENOL) 500 MG tablet Take 1,000 mg by mouth every 6 (six) hours as needed for pain.   Yes Historical Provider, MD  alprazolam Prudy Feeler) 2 MG tablet Take 2 mg by mouth at bedtime.    Yes Historical Provider, MD  cinacalcet (SENSIPAR) 60 MG tablet Take 60 mg by mouth 2 (two) times daily.   Yes Historical Provider, MD  metoprolol succinate (TOPROL-XL) 25 MG 24 hr tablet Take 25 mg by mouth at bedtime.    Yes Historical Provider, MD  multivitamin (RENA-VIT) TABS tablet Take 1 tablet by mouth daily.   Yes Historical Provider, MD  sevelamer (RENVELA) 800 MG tablet Take 2,400 mg by mouth 3 (three) times daily with meals.   Yes Historical Provider, MD   Physical Exam: Filed Vitals:   06/06/12 0715 06/06/12 0820 06/06/12 0908 06/06/12 1042  BP: 139/78 142/74 139/78 176/108  Pulse: 85 88 89 96  Temp:   98.2 F (36.8 C) 98.3 F (36.8 C)  TempSrc:   Oral Oral  Resp: 17  18 18   Height:    5\' 6"  (1.676 m)  Weight:    129.865 kg (286 lb 4.8 oz)  SpO2: 98% 98% 97% 96%    Constitutional: He is oriented to person, place, and time. He appears well-developed and well-nourished, alert, awake , no distress HENT:  Head: Normocephalic and atraumatic.  Right Ear: External ear normal.  Left Ear: External ear normal.  Mouth/Throat: Oropharynx is clear and moist.  Abrasion to lower lip  Eyes: EOM are normal. Pupils are equal, round, and  reactive to light.  Neck: Normal range of motion. Neck supple.  Cardiovascular: Normal rate and intact distal pulses.  Pulmonary/Chest: Effort normal and breath sounds normal. He exhibits no tenderness.  Abdominal: Soft. Bowel sounds are normal. There is no tenderness. There is no rebound.  Musculoskeletal: He exhibits no tenderness.  Ext: AVF in LUE Neurological: He is alert and oriented to person, place, and time. No cranial nerve deficit. Coordination normal.  Increased reflexes and  Asterixes noted Skin: No rash noted.  Psychiatric: He has a normal mood and affect.    Labs on Admission:  Basic Metabolic Panel:  Recent Labs Lab 06/06/12 0611  NA 143  K 5.1  CL 86*  CO2 41*  GLUCOSE 86  BUN 66*  CREATININE 21.81*  CALCIUM 8.3*   Liver Function Tests: No results found for this basename: AST, ALT, ALKPHOS, BILITOT, PROT, ALBUMIN,  in the last 168 hours No results found for this basename: LIPASE, AMYLASE,  in the last 168 hours No results found for this basename: AMMONIA,  in the last 168 hours CBC:  Recent Labs Lab 06/06/12 0611  WBC 7.7  HGB 11.7*  HCT 36.1*  MCV 102.0*  PLT 267   Cardiac Enzymes: No results found for this basename: CKTOTAL, CKMB, CKMBINDEX, TROPONINI,  in the last 168 hours  BNP (last 3 results) No results found for this basename: PROBNP,  in the last 8760 hours CBG:  Recent Labs Lab 06/06/12 0543  GLUCAP 79    Radiological Exams on Admission: Dg Chest 1 View  06/06/2012   *RADIOLOGY REPORT*  Clinical Data: Syncope.  Shortness of breath.  CHEST - 1 VIEW  Comparison: None.  Findings: Heart size and pulmonary vascularity are normal and the lungs are clear.  No osseous abnormality.  IMPRESSION: Normal chest.   Original Report Authenticated By: Francene Boyers, M.D.   Dg Ankle Complete Right  06/06/2012   *RADIOLOGY REPORT*  Clinical Data: Right ankle pain secondary to a fall.  RIGHT ANKLE - COMPLETE 3+ VIEW  Comparison: None.  Findings: There are tiny avulsions from the medial aspect of the talus which I suspect are old.  Minimal osteophytes on the distal tibia.  No acute fracture or dislocation.  Tiny ankle effusion.  Slight dorsal spurring at the talonavicular joint.  IMPRESSION: Tiny joint effusion.  No acute osseous abnormality.   Original Report Authenticated By: Francene Boyers, M.D.   Ct Head Wo Contrast  06/06/2012   *RADIOLOGY REPORT*  Clinical Data: The patient fell and struck his head.  Seizure.  CT HEAD WITHOUT CONTRAST   Technique:  Contiguous axial images were obtained from the base of the skull through the vertex without contrast.  Comparison: None.  Findings: There is no acute intracranial hemorrhage, infarction, or mass lesion.  Brain parenchyma is normal.  Osseous structures are normal.  IMPRESSION: Normal exam.   Original Report Authenticated By: Francene Boyers, M.D.   Ct Cervical Spine Wo Contrast  06/06/2012   *RADIOLOGY REPORT*  Clinical Data: Syncope with a fall and head trauma.  Seizure-like activity.  CT CERVICAL SPINE WITHOUT CONTRAST  Technique:  Multidetector CT imaging of the cervical spine was performed. Multiplanar CT image reconstructions were also generated.  Comparison: None.  Findings: There is no fracture, subluxation or prevertebral soft tissue swelling.  Minimal disc space narrowing at C5-6, with slight anterior osteophytes.  Soft tissues are normal.  IMPRESSION: No acute abnormality of the cervical spine.   Original Report Authenticated By: Francene Boyers, M.D.  Dg Knee Complete 4 Views Right  06/06/2012   *RADIOLOGY REPORT*  Clinical Data: Right knee pain secondary to a fall.  RIGHT KNEE - COMPLETE 4+ VIEW  Comparison: 01/06/2012  Findings: There is no fracture, dislocation, or joint effusion.  No arthritis.  IMPRESSION: Normal exam.   Original Report Authenticated By: Francene Boyers, M.D.    Assessment/Plan  1. Seizure -I suspect this is metabolic in etiology from missed HD(3 treatments), Uremia related -CT head unremarkable -Renal consulted for Urgent HD, d/w Dr.Schertz -does not have functioning AVF, will need HD catheter ASAP -Ativan PRN -no prior h/o seizures, if recurs after HD then will need further Neuro workup namely MRI/EEG  2. ESRD/Missed HD -Urgent HD today per Renal -Fistulogram per IR tomorrow  3. Macrocytic anemia -check B12/folate, anemia panel  4. HTn: continue metoprolol  5. Secondary hyperparathyroidism -continue sensipar and phosphate binders  DVt proph:  heparin SQ  Code Status:FULL Family Communication: d/w pt and friend at bedside Disposition Plan: inpatient  Time spent:22min  [facility] Pager [phone]  If 7PM-7AM, please contact night-coverage [email or web address] Password [facility] 06/06/2012, 11:24 AM

## 2012-06-06 NOTE — ED Notes (Signed)
Pt states his leg gave out while trying to use restroom, pt hit lip when fell, some dried blood noted to lip. Ems reports pt twitching & having seizure like activity. Pt was given 2 mg of ativan IV by EMS. Pt alert & oriented x4. Pt states he is to have surgery today to repair fistula at 1230.

## 2012-06-06 NOTE — ED Notes (Signed)
Pt arrived by ems from home. Reported pt having a seizure when they arrived & was given 2 mg ativan. Pt has not been to dialysis in 5 days.

## 2012-06-07 DIAGNOSIS — I770 Arteriovenous fistula, acquired: Secondary | ICD-10-CM

## 2012-06-07 DIAGNOSIS — N186 End stage renal disease: Secondary | ICD-10-CM | POA: Diagnosis not present

## 2012-06-07 DIAGNOSIS — D638 Anemia in other chronic diseases classified elsewhere: Secondary | ICD-10-CM | POA: Diagnosis not present

## 2012-06-07 DIAGNOSIS — R55 Syncope and collapse: Secondary | ICD-10-CM | POA: Diagnosis not present

## 2012-06-07 DIAGNOSIS — R569 Unspecified convulsions: Principal | ICD-10-CM

## 2012-06-07 LAB — CBC
MCH: 32.4 pg (ref 26.0–34.0)
MCV: 99.7 fL (ref 78.0–100.0)
Platelets: 153 10*3/uL (ref 150–400)
RDW: 17.2 % — ABNORMAL HIGH (ref 11.5–15.5)

## 2012-06-07 LAB — IRON AND TIBC: Iron: 49 ug/dL (ref 42–135)

## 2012-06-07 LAB — FOLATE: Folate: >20 ng/mL

## 2012-06-07 LAB — BASIC METABOLIC PANEL
BUN: 42 mg/dL — ABNORMAL HIGH (ref 6–23)
Calcium: 8.1 mg/dL — ABNORMAL LOW (ref 8.4–10.5)
Creatinine, Ser: 15.27 mg/dL — ABNORMAL HIGH (ref 0.50–1.35)
GFR calc Af Amer: 4 mL/min — ABNORMAL LOW (ref >90)

## 2012-06-07 LAB — VITAMIN B12: Vitamin B-12: 647 pg/mL (ref 211–911)

## 2012-06-07 MED ORDER — BOOST / RESOURCE BREEZE PO LIQD: 1.0000 | Freq: Two times a day (BID) | ORAL | Status: DC

## 2012-06-07 MED ORDER — CALCIUM ACETATE 667 MG PO CAPS
2668.0000 mg | ORAL_CAPSULE | Freq: Three times a day (TID) | ORAL | Status: DC
  Administered 2012-06-07 – 2012-06-09 (×2): 2668 mg via ORAL
  Filled 2012-06-07 (×9): qty 4

## 2012-06-07 MED ORDER — HEPARIN SODIUM (PORCINE) 1000 UNIT/ML IJ SOLN
3000.0000 [IU] | Freq: Once | INTRAMUSCULAR | Status: AC
  Administered 2012-06-07: 3000 [IU] via INTRAVENOUS

## 2012-06-07 NOTE — Progress Notes (Signed)
[facility] Progress Note  Subjective:  LE pain. Asterixis still present.  Does feel better post HD.  Objective Filed Vitals:   06/06/12 1735 06/06/12 1810 06/06/12 2117 06/07/12 0609  BP: 175/88 141/85 154/80 142/89  Pulse: 95 97 98 89  Temp: 98 F (36.7 C) 98.5 F (36.9 C) 99.7 F (37.6 C) 98.8 F (37.1 C)  TempSrc: Oral Oral Oral Oral  Resp: 18 18 18 18   Height:   5\' 6"  (1.676 m)   Weight:   125.147 kg (275 lb 14.4 oz)   SpO2: 98% 98% 98% 97%   Physical Exam General: Alert, cooperative, NAD Heart: RRR Lungs: CTA bilaterally. Crackles resolved. No wheezes, rales, rhonchi Abdomen: Soft, NT, non-distended, normal BS Extremities: +LE edema, R ankle swelling/tenderness Dialysis Access: Temp cath Rt groin, LFA AVF with strong distal bruit, faint proximally  Dialysis Orders: Center: [facility] in [place]/ MWF  Time 3:75. EDW 121 kg 2K/2.5Ca Bath LFA AVF BFR 500/ DFR 800. 15g needles. Heparin 4000 bolus/ 2700 mid. Hectorol 1.5 mcg IV/HD  Assessment/Plan: 1. Witnessed Seizure - Presumed secondary to uremia. No history of seizures. None further. Head CT, LE plain films with no acute process. 2. ESRD/ Dialysis Access - MWF. Ongoing problems with LFA AVF x 2 weeks. Temp catheter placed and HD yesterday. However, still with asterixis this am. Plan for extra session today and fistulogram later or possibly tomorrow. IR is aware. K+ 4.7 3. Hypertension/volume - SBPs 140's-170s. Home metoprolol 25 mg QD. Net UF 6L yesterday. Still with asterixis and +LE edema. Lungs clear however. CXR clear. Extra treatment today with goal of 4L. 4. Anemia - Hgb 11.3. Hold ESAs for now. Tsat 19%, Ferritin 768. 5. Metabolic bone disease - Ca 8.1 (9 corrected)  P 5.2 on home  Renvela 3 and Phoslo 4 with meals. Continue  Sensipar 120 mg. Hectorol 1.5 mcg 6. Nutrition - Albumin 2.9 NPO for now pending fistulogram. High protein renal diet when appropriate, Multivitamin. Add Breeze. 7. Dispo -  Seeking transfer from [facility] to [facility] for outpatient HD. Will coordinate through outpatient SW.   Scot Jun. Thad Ranger [facility] Pager [phone] 06/07/2012,9:36 AM  LOS: 1 day   Patient seen and examined.  Agree with assessment and plan as above.  Will remove temp cath after HD today, uremia and asterixis resolved now.  IR to eval AVF in am Friday and hopefully can salvage, if not will place tunneled HD cath.   Vinson Moselle  MD [phone] pgr    [phone] cell 06/07/2012, 12:02 PM   Additional Objective Labs: Basic Metabolic Panel:  Recent Labs Lab 06/06/12 0611 06/06/12 1300 06/07/12 0520  NA 143 145 138  K 5.1 4.5 4.7  CL 86* 90* 93*  CO2 41* 39* 28  GLUCOSE 86 83 91  BUN 66* 68* 42*  CREATININE 21.81* 21.30* 15.27*  CALCIUM 8.3* 7.6* 8.1*  PHOS  --  5.2*  --    Liver Function Tests:  Recent Labs Lab 06/06/12 1300  ALBUMIN 2.9*   No results found for this basename: LIPASE, AMYLASE,  in the last 168 hours CBC:  Recent Labs Lab 06/06/12 0611 06/07/12 0520  WBC 7.7 8.0  HGB 11.7* 11.3*  HCT 36.1* 34.8*  MCV 102.0* 99.7  PLT 267 153   CBG:  Recent Labs Lab 06/06/12 0543  GLUCAP 79   Iron Studies:  Recent Labs  06/06/12 1756  IRON 49  TIBC 256  FERRITIN 768*   @lablastinr3 @  Studies/Results: Dg Chest 1 View  06/06/2012   *RADIOLOGY REPORT*  Clinical Data: Syncope.  Shortness of breath.  CHEST - 1 VIEW  Comparison: None.  Findings: Heart size and pulmonary vascularity are normal and the lungs are clear.  No osseous abnormality.  IMPRESSION: Normal chest.   Original Report Authenticated By: Francene Boyers, M.D.   Dg Ankle Complete Right  06/06/2012   *RADIOLOGY REPORT*  Clinical Data: Right ankle pain secondary to a fall.  RIGHT ANKLE - COMPLETE 3+ VIEW  Comparison: None.  Findings: There are tiny avulsions from the medial aspect of the talus which I suspect are old.  Minimal osteophytes on the distal tibia.   No acute fracture or dislocation.  Tiny ankle effusion.  Slight dorsal spurring at the talonavicular joint.  IMPRESSION: Tiny joint effusion.  No acute osseous abnormality.   Original Report Authenticated By: Francene Boyers, M.D.   Ct Head Wo Contrast  06/06/2012   *RADIOLOGY REPORT*  Clinical Data: The patient fell and struck his head.  Seizure.  CT HEAD WITHOUT CONTRAST  Technique:  Contiguous axial images were obtained from the base of the skull through the vertex without contrast.  Comparison: None.  Findings: There is no acute intracranial hemorrhage, infarction, or mass lesion.  Brain parenchyma is normal.  Osseous structures are normal.  IMPRESSION: Normal exam.   Original Report Authenticated By: Francene Boyers, M.D.   Ct Cervical Spine Wo Contrast  06/06/2012   *RADIOLOGY REPORT*  Clinical Data: Syncope with a fall and head trauma.  Seizure-like activity.  CT CERVICAL SPINE WITHOUT CONTRAST  Technique:  Multidetector CT imaging of the cervical spine was performed. Multiplanar CT image reconstructions were also generated.  Comparison: None.  Findings: There is no fracture, subluxation or prevertebral soft tissue swelling.  Minimal disc space narrowing at C5-6, with slight anterior osteophytes.  Soft tissues are normal.  IMPRESSION: No acute abnormality of the cervical spine.   Original Report Authenticated By: Francene Boyers, M.D.   Dg Knee Complete 4 Views Right  06/06/2012   *RADIOLOGY REPORT*  Clinical Data: Right knee pain secondary to a fall.  RIGHT KNEE - COMPLETE 4+ VIEW  Comparison: 01/06/2012  Findings: There is no fracture, dislocation, or joint effusion.  No arthritis.  IMPRESSION: Normal exam.   Original Report Authenticated By: Francene Boyers, M.D.   Medications:   . calcium acetate  2,668 mg Oral TID WC  . cinacalcet  60 mg Oral BID WC  . doxercalciferol  1.5 mcg Intravenous Q M,W,F-HD  . enoxaparin (LOVENOX) injection  30 mg Subcutaneous Q24H  . metoprolol succinate  25 mg Oral  QHS  . metoprolol tartrate  25 mg Oral QHS  . multivitamin  1 tablet Oral Daily  . multivitamin  1 tablet Oral Daily  . nicotine  14 mg Transdermal Daily  . sevelamer carbonate  2,400 mg Oral TID WC  . sevelamer carbonate  2,400 mg Oral TID WC  . sodium chloride  3 mL Intravenous Q12H

## 2012-06-07 NOTE — Progress Notes (Signed)
Utilization Review completed.  Lolly Glaus RN CM  

## 2012-06-07 NOTE — Progress Notes (Signed)
06/07/12 1044 nsg Pt refused bed alarm wife in room and stays at bedside.

## 2012-06-07 NOTE — Progress Notes (Signed)
06/07/12 1045 nsg Pt refuses PIV claims the one he has was hurting him so they took it out yesterday per pt. Dr.schertz is aware. Informed him that he needs one because he has telemetry and he said we can take the tele off. We'll inform MD.

## 2012-06-07 NOTE — Progress Notes (Signed)
[facility] PROGRESS NOTE  GUILLAUME WENINGER OVF:[id number] DOB: 07-15-1970 DOA: 06/06/2012 PCP: No primary provider on file.   Brief narrative: 42 y.o. male with ESRD on HD, had been having issues with his AVF for 2 weeks, he had fistulogram and intervention by a vascular surgeon in [place] 2 weeks ago, subsequently had 3 HD treatments but then since 5/15 could not have HD again due to malfunctioning AVF. He was supposed to have a Fistulogram at [place] on 5/21 but overnight had a witnessed  -tonic clonic seizure He was taken to [facility] ER and transferred to [place]. Head CT on admission negative.   Assessment/Plan: Seizure -likely  metabolic in etiology from missed 3 HD and associated uremia. Had asterixis on presentation -CT head negative -no prior hx and  further seizure activity. Continue Ativan prn.  ESRD/Missed HD -Urgent HD done on admission after rt femoral cathter placed Renal fn improving. Uremia resolved  Dysfunctional  LUE AV graft  -shuntogram per IR today  3. Macrocytic anemia  Low iron sats. Normal B12 and folate   4. HTN  continue metoprolol   Secondary hyperparathyroidism  -continue sensipar and phosphate binders   DVt prophylaxis: lovenox SQ   Code Status:FULL  Family Communication: none at bedside Disposition Plan:home with outpt HD set up   Consultants:  Renal  IR  Procedures:  For shuntogram today  Antibiotics:  none  HPI/Subjective: Patient seen during HD. denies any symptoms  Objective: Filed Vitals:   06/07/12 1400 06/07/12 1430 06/07/12 1500 06/07/12 1515  BP: 115/85 110/80 106/72 98/82  Pulse: 103 105 113 113  Temp:    98.7 F (37.1 C)  TempSrc:    Oral  Resp: 20 19 24 21   Height:      Weight:    120.7 kg (266 lb 1.5 oz)  SpO2:    95%    Intake/Output Summary (Last 24 hours) at 06/07/12 1631 Last data filed at 06/07/12 1515  Gross per 24 hour  Intake      0 ml  Output  84166 ml  Net -10940 ml   Filed  Weights   06/06/12 2117 06/07/12 1130 06/07/12 1515  Weight: 125.147 kg (275 lb 14.4 oz) 125.9 kg (277 lb 9 oz) 120.7 kg (266 lb 1.5 oz)    Exam:   General:  Middle aged obese male in NAD  HEENT: no pallor, moist mucosa  Cardiovascular: NS1&S2, no murmurs  Respiratory: clear b/l, no added sounds  Abdomen: soft, NT, ND, BS+  Musculoskeletal: warm, LUE fistula with good thrill, rt femoral HD catheter  CNS: AAOX3  Data Reviewed: Basic Metabolic Panel:  Recent Labs Lab 06/06/12 0611 06/06/12 1300 06/07/12 0520  NA 143 145 138  K 5.1 4.5 4.7  CL 86* 90* 93*  CO2 41* 39* 28  GLUCOSE 86 83 91  BUN 66* 68* 42*  CREATININE 21.81* 21.30* 15.27*  CALCIUM 8.3* 7.6* 8.1*  PHOS  --  5.2*  --    Liver Function Tests:  Recent Labs Lab 06/06/12 1300  ALBUMIN 2.9*   No results found for this basename: LIPASE, AMYLASE,  in the last 168 hours No results found for this basename: AMMONIA,  in the last 168 hours CBC:  Recent Labs Lab 06/06/12 0611 06/07/12 0520  WBC 7.7 8.0  HGB 11.7* 11.3*  HCT 36.1* 34.8*  MCV 102.0* 99.7  PLT 267 153   Cardiac Enzymes: No results found for this basename: CKTOTAL, CKMB, CKMBINDEX, TROPONINI,  in the  last 168 hours BNP (last 3 results) No results found for this basename: PROBNP,  in the last 8760 hours CBG:  Recent Labs Lab 06/06/12 0543  GLUCAP 79    Recent Results (from the past 240 hour(s))  MRSA PCR SCREENING     Status: None   Collection Time    06/06/12  6:42 PM      Result Value Range Status   MRSA by PCR NEGATIVE  NEGATIVE Final   Comment:            The GeneXpert MRSA Assay (FDA     approved for NASAL specimens     only), is one component of a     comprehensive MRSA colonization     surveillance program. It is not     intended to diagnose MRSA     infection nor to guide or     monitor treatment for     MRSA infections.     Studies: Dg Chest 1 View  06/06/2012   *RADIOLOGY REPORT*  Clinical Data: Syncope.   Shortness of breath.  CHEST - 1 VIEW  Comparison: None.  Findings: Heart size and pulmonary vascularity are normal and the lungs are clear.  No osseous abnormality.  IMPRESSION: Normal chest.   Original Report Authenticated By: Francene Boyers, M.D.   Dg Ankle Complete Right  06/06/2012   *RADIOLOGY REPORT*  Clinical Data: Right ankle pain secondary to a fall.  RIGHT ANKLE - COMPLETE 3+ VIEW  Comparison: None.  Findings: There are tiny avulsions from the medial aspect of the talus which I suspect are old.  Minimal osteophytes on the distal tibia.  No acute fracture or dislocation.  Tiny ankle effusion.  Slight dorsal spurring at the talonavicular joint.  IMPRESSION: Tiny joint effusion.  No acute osseous abnormality.   Original Report Authenticated By: Francene Boyers, M.D.   Ct Head Wo Contrast  06/06/2012   *RADIOLOGY REPORT*  Clinical Data: The patient fell and struck his head.  Seizure.  CT HEAD WITHOUT CONTRAST  Technique:  Contiguous axial images were obtained from the base of the skull through the vertex without contrast.  Comparison: None.  Findings: There is no acute intracranial hemorrhage, infarction, or mass lesion.  Brain parenchyma is normal.  Osseous structures are normal.  IMPRESSION: Normal exam.   Original Report Authenticated By: Francene Boyers, M.D.   Ct Cervical Spine Wo Contrast  06/06/2012   *RADIOLOGY REPORT*  Clinical Data: Syncope with a fall and head trauma.  Seizure-like activity.  CT CERVICAL SPINE WITHOUT CONTRAST  Technique:  Multidetector CT imaging of the cervical spine was performed. Multiplanar CT image reconstructions were also generated.  Comparison: None.  Findings: There is no fracture, subluxation or prevertebral soft tissue swelling.  Minimal disc space narrowing at C5-6, with slight anterior osteophytes.  Soft tissues are normal.  IMPRESSION: No acute abnormality of the cervical spine.   Original Report Authenticated By: Francene Boyers, M.D.   Dg Knee Complete 4 Views  Right  06/06/2012   *RADIOLOGY REPORT*  Clinical Data: Right knee pain secondary to a fall.  RIGHT KNEE - COMPLETE 4+ VIEW  Comparison: 01/06/2012  Findings: There is no fracture, dislocation, or joint effusion.  No arthritis.  IMPRESSION: Normal exam.   Original Report Authenticated By: Francene Boyers, M.D.    Scheduled Meds: . calcium acetate  2,668 mg Oral TID WC  . cinacalcet  60 mg Oral BID WC  . doxercalciferol  1.5 mcg Intravenous Q M,W,F-HD  .  enoxaparin (LOVENOX) injection  30 mg Subcutaneous Q24H  . feeding supplement  1 Container Oral BID BM  . metoprolol succinate  25 mg Oral QHS  . metoprolol tartrate  25 mg Oral QHS  . multivitamin  1 tablet Oral Daily  . multivitamin  1 tablet Oral Daily  . nicotine  14 mg Transdermal Daily  . sevelamer carbonate  2,400 mg Oral TID WC  . sevelamer carbonate  2,400 mg Oral TID WC  . sodium chloride  3 mL Intravenous Q12H   Continuous Infusions:     Time spent: 25 minutes    Makella Buckingham  [facility] Pager [phone]. If 7PM-7AM, please contact night-coverage at [email or web address], password [facility] 06/07/2012, 4:31 PM  LOS: 1 day

## 2012-06-07 NOTE — Procedures (Signed)
I was present at this dialysis session. I have reviewed the session itself and made appropriate changes.   Vinson Moselle, MD [employer] 06/07/2012, 11:58 AM

## 2012-06-08 ENCOUNTER — Inpatient Hospital Stay (HOSPITAL_COMMUNITY): Payer: Medicare Other

## 2012-06-08 DIAGNOSIS — R569 Unspecified convulsions: Secondary | ICD-10-CM | POA: Diagnosis not present

## 2012-06-08 DIAGNOSIS — T82898A Other specified complication of vascular prosthetic devices, implants and grafts, initial encounter: Secondary | ICD-10-CM | POA: Diagnosis not present

## 2012-06-08 DIAGNOSIS — Z4901 Encounter for fitting and adjustment of extracorporeal dialysis catheter: Secondary | ICD-10-CM | POA: Diagnosis not present

## 2012-06-08 DIAGNOSIS — I770 Arteriovenous fistula, acquired: Secondary | ICD-10-CM | POA: Diagnosis not present

## 2012-06-08 DIAGNOSIS — R55 Syncope and collapse: Secondary | ICD-10-CM | POA: Diagnosis not present

## 2012-06-08 DIAGNOSIS — N186 End stage renal disease: Secondary | ICD-10-CM | POA: Diagnosis not present

## 2012-06-08 LAB — BASIC METABOLIC PANEL
BUN: 38 mg/dL — ABNORMAL HIGH (ref 6–23)
Calcium: 9 mg/dL (ref 8.4–10.5)
Creatinine, Ser: 13.29 mg/dL — ABNORMAL HIGH (ref 0.50–1.35)
GFR calc Af Amer: 5 mL/min — ABNORMAL LOW (ref >90)
GFR calc non Af Amer: 4 mL/min — ABNORMAL LOW (ref >90)

## 2012-06-08 MED ORDER — MIDAZOLAM HCL 2 MG/2ML IJ SOLN
INTRAMUSCULAR | Status: AC
  Filled 2012-06-08: qty 4

## 2012-06-08 MED ORDER — FENTANYL CITRATE 0.05 MG/ML IJ SOLN
INTRAMUSCULAR | Status: AC
  Filled 2012-06-08: qty 4

## 2012-06-08 MED ORDER — FENTANYL CITRATE 0.05 MG/ML IJ SOLN
INTRAMUSCULAR | Status: AC | PRN
  Administered 2012-06-08: 25 ug via INTRAVENOUS
  Administered 2012-06-08: 50 ug via INTRAVENOUS

## 2012-06-08 MED ORDER — IOHEXOL 300 MG/ML  SOLN
100.0000 mL | Freq: Once | INTRAMUSCULAR | Status: AC | PRN
  Administered 2012-06-08: 50 mL via INTRAVENOUS

## 2012-06-08 MED ORDER — HEPARIN SODIUM (PORCINE) 1000 UNIT/ML IJ SOLN
INTRAMUSCULAR | Status: AC
  Administered 2012-06-08: 16:00:00
  Filled 2012-06-08: qty 1

## 2012-06-08 MED ORDER — CEFAZOLIN SODIUM-DEXTROSE 2-3 GM-% IV SOLR
INTRAVENOUS | Status: AC
  Administered 2012-06-08: 2000 mg
  Filled 2012-06-08: qty 50

## 2012-06-08 MED ORDER — MIDAZOLAM HCL 2 MG/2ML IJ SOLN
INTRAMUSCULAR | Status: AC | PRN
  Administered 2012-06-08 (×2): 1 mg via INTRAVENOUS

## 2012-06-08 NOTE — Progress Notes (Addendum)
06/08/2012 12:11 PM Hemodialysis Outpatient Note; this patient has been accepted at the [facility] on a TTS 1st shift schedule. The patient can begin treatment tomorrow ONLY if he is able to sign paperwork today, otherwise they will begin treatment on Tuesday 05.26.14 at 7AM. Thank you. Tilman Neat Correction-the start date would be Tuesday 05.27.14. Thank you. Tilman Neat

## 2012-06-08 NOTE — Progress Notes (Signed)
Pt refusing bed alarm. Significant other at bedside removed alarm. Instructed pt to call prior to getting up, encouraged fall alarm d/t fall PTA resulting in this hospitalization.

## 2012-06-08 NOTE — Progress Notes (Signed)
[facility] PROGRESS NOTE  Perry [name]:[id number] DOB: [birth year] DOA: 06/06/2012 PCP: No primary provider on file.  Brief narrative:  42 y.o. male with ESRD on HD, had been having issues with his AVF for 2 weeks, he had fistulogram and intervention by a vascular surgeon in [place] 2 weeks ago, subsequently had 3 HD treatments but then since 5/15 could not have HD again due to malfunctioning AVF. He was supposed to have a Fistulogram at [place] on 5/21 but overnight had a witnessed -tonic clonic seizure He was taken to [facility] ER and transferred to [place]. Head CT on admission negative.   Assessment/Plan:  Seizure  -likely metabolic in etiology from missed 3 HD and associated uremia. Had asterixis on presentation . Now resolved -CT head negative  -no prior hx and further seizure activity. Continue Ativan prn.   ESRD/Missed HD  -Urgent HD done on admission after rt femoral cathter placed  Renal fn improving. Uremia resolved  HD catheter removed adn plan for fistulogram today. He has an outpt HD sent up from Tuesday next week.   Dysfunctional LUE AV graft  -shuntogram per IR today.  3. Macrocytic anemia  Low iron sats. Normal B12 and folate   4. HTN  continue metoprolol   Secondary hyperparathyroidism  -continue sensipar and phosphate binders   DVt prophylaxis: lovenox SQ   Code Status:FULL  Family Communication: none at bedside  Disposition Plan:home likely tomorrow after HD. awaiting shuntogram today  Consultants:  Renal  IR   Procedures:  shuntogram pending  Antibiotics:  none HPI/Subjective:  No overnight issues   Objective: Filed Vitals:   06/08/12 1602 06/08/12 1622 06/08/12 1626 06/08/12 1630  BP: 144/80 132/89 137/90 138/89  Pulse: 84 74 76 81  Temp:      TempSrc:      Resp: 15 15 17 13   Height:      Weight:      SpO2: 100% 99% 100% 97%    Intake/Output Summary (Last 24 hours) at 06/08/12 1637 Last data filed at 06/08/12  1100  Gross per 24 hour  Intake      0 ml  Output      0 ml  Net      0 ml   Filed Weights   06/06/12 2117 06/07/12 1130 06/07/12 1515  Weight: 125.147 kg (275 lb 14.4 oz) 125.9 kg (277 lb 9 oz) 120.7 kg (266 lb 1.5 oz)    Exam:  General: Middle aged obese male in NAD  HEENT: no pallor, moist mucosa  Cardiovascular: NS1&S2, no murmurs  Respiratory: clear b/l, no added sounds  Abdomen: soft, NT, ND, BS+  Musculoskeletal: warm, LUE fistula with good thrill, rt femoral HD catheter  Removed. CNS: AAOX3   Data Reviewed: Basic Metabolic Panel:  Recent Labs Lab 06/06/12 0611 06/06/12 1300 06/07/12 0520 06/08/12 0520  NA 143 145 138 139  K 5.1 4.5 4.7 5.4*  CL 86* 90* 93* 94*  CO2 41* 39* 28 32  GLUCOSE 86 83 91 95  BUN 66* 68* 42* 38*  CREATININE 21.81* 21.30* 15.27* 13.29*  CALCIUM 8.3* 7.6* 8.1* 9.0  PHOS  --  5.2*  --   --    Liver Function Tests:  Recent Labs Lab 06/06/12 1300  ALBUMIN 2.9*   No results found for this basename: LIPASE, AMYLASE,  in the last 168 hours No results found for this basename: AMMONIA,  in the last 168 hours CBC:  Recent Labs Lab 06/06/12 [phone]  06/07/12 0520  WBC 7.7 8.0  HGB 11.7* 11.3*  HCT 36.1* 34.8*  MCV 102.0* 99.7  PLT 267 153   Cardiac Enzymes: No results found for this basename: CKTOTAL, CKMB, CKMBINDEX, TROPONINI,  in the last 168 hours BNP (last 3 results) No results found for this basename: PROBNP,  in the last 8760 hours CBG:  Recent Labs Lab 06/06/12 0543  GLUCAP 79    Recent Results (from the past 240 hour(s))  MRSA PCR SCREENING     Status: None   Collection Time    06/06/12  6:42 PM      Result Value Range Status   MRSA by PCR NEGATIVE  NEGATIVE Final   Comment:            The GeneXpert MRSA Assay (FDA     approved for NASAL specimens     only), is one component of a     comprehensive MRSA colonization     surveillance program. It is not     intended to diagnose MRSA     infection nor to  guide or     monitor treatment for     MRSA infections.     Studies: No results found.  Scheduled Meds: . calcium acetate  2,668 mg Oral TID WC  . cinacalcet  60 mg Oral BID WC  . doxercalciferol  1.5 mcg Intravenous Q M,W,F-HD  . enoxaparin (LOVENOX) injection  30 mg Subcutaneous Q24H  . feeding supplement  1 Container Oral BID BM  . fentaNYL      . heparin      . metoprolol succinate  25 mg Oral QHS  . metoprolol tartrate  25 mg Oral QHS  . midazolam      . multivitamin  1 tablet Oral Daily  . multivitamin  1 tablet Oral Daily  . nicotine  14 mg Transdermal Daily  . sevelamer carbonate  2,400 mg Oral TID WC  . sodium chloride  3 mL Intravenous Q12H   Continuous Infusions:     Time spent:25 minutes    Lillith Mcneff  [facility] Pager [phone]. If 7PM-7AM, please contact night-coverage at [email or web address], password [facility] 06/08/2012, 4:37 PM  LOS: 2 days

## 2012-06-08 NOTE — Progress Notes (Signed)
[facility] Progress Note  Subjective:   Right ankle soreness/swelling. Feels good otherwise. Ready to go home.  Objective Filed Vitals:   06/07/12 1500 06/07/12 1515 06/07/12 2202 06/08/12 0600  BP: 106/72 98/82 127/89 107/76  Pulse: 113 113 103 81  Temp:  98.7 F (37.1 C) 98.7 F (37.1 C) 98.7 F (37.1 C)  TempSrc:  Oral Oral Oral  Resp: 24 21 20 20   Height:      Weight:  120.7 kg (266 lb 1.5 oz)    SpO2:  95% 99% 100%   Physical Exam General: Sitting on side of bed. NAD Heart: RRR Lungs: CTA bilaterally. No wheezes, rales, or rhonchi noted Abdomen: obese, NT, normal BS Extremities: No asterixis. +edema to rt foot/ankle Dialysis Access: LFA AVF, strong distal bruit/faint proximally  Dialysis Orders: Center: [facility] in [place]/ MWF  Time 3:75. EDW 121 kg 2K/2.5Ca Bath LFA AVF BFR 500/ DFR 800. 15g needles. Heparin 4000 bolus/ 2700 mid. Hectorol 1.5 mcg IV/HD  Assessment/Plan: 1. Witnessed Seizure - Presumed secondary to uremia. No history of seizures. None further. Head CT, LE plain films with no acute process. 2. ESRD/ Dialysis Access - MWF. Ongoing problems with LFA AVF x 2 weeks. Temp catheter was placed and utilized for HD x 2. Catheter has now been removed but we are still awaiting fistulogram. Unable to d/c until he has a working access. K+ 5.4 Had HD Wed and Thurs. Given is K+, will likely need another session before Monday. Will explore Saturday at either old or new op center.  3. Hypertension/volume - SBPs much better with fluid removal. 90's to 120's.  On metoprolol succinate 25 mg QHS and metoprolol tartrate 25 QHS. Net UF yesterday. Close to EDW. Asterixis resolved. Lungs/CXR clear. RLE edema likely related to his fall. 4. Anemia - Hgb 11.3. Hold ESAs for now. Tsat 19%, Ferritin 768. 5. Metabolic bone disease - Ca 8.1 (9 corrected) P 5.2 on home Renvela 3 and Phoslo 4 with meals. Continue Sensipar 120 mg. Hectorol 1.5 mcg 6. Nutrition -  Albumin 2.9 NPO for now pending fistulogram. High protein renal diet when appropriate, Multivitamin. Add Breeze. 7. Dispo - ? After fistulogram. Seeking transfer from [facility] to [facility] for outpatient HD. Pt and girlfriend state that they have had a discussion with desired kidney center. Will follow up with SW and forward appropriate discharge info.   Scot Jun. Broadus John, PA-C [facility] Pager [phone] 06/08/2012,10:48 AM  LOS: 2 days   Patient seen and examined.  Agree with assessment and plan as above.  Agree will need one more HD this week for high K, not sure yet whether it will need to be outpt or inpt. Perry Moselle  MD [phone] pgr    [phone] cell 06/08/2012, 11:14 AM   Additional Objective Labs: Basic Metabolic Panel:  Recent Labs Lab 06/06/12 0611 06/06/12 1300 06/07/12 0520 06/08/12 0520  NA 143 145 138 139  K 5.1 4.5 4.7 5.4*  CL 86* 90* 93* 94*  CO2 41* 39* 28 32  GLUCOSE 86 83 91 95  BUN 66* 68* 42* 38*  CREATININE 21.81* 21.30* 15.27* 13.29*  CALCIUM 8.3* 7.6* 8.1* 9.0  PHOS  --  5.2*  --   --    Liver Function Tests:  Recent Labs Lab 06/06/12 1300  ALBUMIN 2.9*   No results found for this basename: LIPASE, AMYLASE,  in the last 168 hours CBC:  Recent Labs Lab 06/06/12 0611 06/07/12 0520  WBC 7.7 8.0  HGB 11.7* 11.3*  HCT 36.1* 34.8*  MCV 102.0* 99.7  PLT 267 153   Blood Culture No results found for this basename: sdes, specrequest, cult, reptstatus    Cardiac Enzymes: No results found for this basename: CKTOTAL, CKMB, CKMBINDEX, TROPONINI,  in the last 168 hours CBG:  Recent Labs Lab 06/06/12 0543  GLUCAP 79   Iron Studies:  Recent Labs  06/06/12 1756  IRON 49  TIBC 256  FERRITIN 768*   @[email or web address] @ Studies/Results: No results found. Medications:   . calcium acetate  2,668 mg Oral TID WC  . cinacalcet  60 mg Oral BID WC  . doxercalciferol  1.5 mcg Intravenous Q M,W,F-HD  .  enoxaparin (LOVENOX) injection  30 mg Subcutaneous Q24H  . feeding supplement  1 Container Oral BID BM  . metoprolol succinate  25 mg Oral QHS  . metoprolol tartrate  25 mg Oral QHS  . multivitamin  1 tablet Oral Daily  . multivitamin  1 tablet Oral Daily  . nicotine  14 mg Transdermal Daily  . sevelamer carbonate  2,400 mg Oral TID WC  . sevelamer carbonate  2,400 mg Oral TID WC  . sodium chloride  3 mL Intravenous Q12H

## 2012-06-08 NOTE — Progress Notes (Signed)
Per IR, unsure when pt will be taken to procedure, but should be taken at some point today. Will continue to monitor.

## 2012-06-09 DIAGNOSIS — R569 Unspecified convulsions: Secondary | ICD-10-CM | POA: Diagnosis not present

## 2012-06-09 DIAGNOSIS — R55 Syncope and collapse: Secondary | ICD-10-CM | POA: Diagnosis not present

## 2012-06-09 DIAGNOSIS — I770 Arteriovenous fistula, acquired: Secondary | ICD-10-CM | POA: Diagnosis not present

## 2012-06-09 DIAGNOSIS — N186 End stage renal disease: Secondary | ICD-10-CM | POA: Diagnosis not present

## 2012-06-09 DIAGNOSIS — D638 Anemia in other chronic diseases classified elsewhere: Secondary | ICD-10-CM | POA: Diagnosis not present

## 2012-06-09 LAB — RENAL FUNCTION PANEL
Albumin: 3.2 g/dL — ABNORMAL LOW (ref 3.5–5.2)
BUN: 59 mg/dL — ABNORMAL HIGH (ref 6–23)
CO2: 21 mEq/L (ref 19–32)
Calcium: 8.3 mg/dL — ABNORMAL LOW (ref 8.4–10.5)
Chloride: 95 mEq/L — ABNORMAL LOW (ref 96–112)
Creatinine, Ser: 16.32 mg/dL — ABNORMAL HIGH (ref 0.50–1.35)
GFR calc Af Amer: 4 mL/min — ABNORMAL LOW (ref >90)
GFR calc non Af Amer: 3 mL/min — ABNORMAL LOW (ref >90)
Glucose, Bld: 89 mg/dL (ref 70–99)
Phosphorus: 8.9 mg/dL — ABNORMAL HIGH (ref 2.3–4.6)
Potassium: 5.2 mEq/L — ABNORMAL HIGH (ref 3.5–5.1)
Sodium: 138 mEq/L (ref 135–145)

## 2012-06-09 LAB — CBC
HCT: 36.4 % — ABNORMAL LOW (ref 39.0–52.0)
Hemoglobin: 12 g/dL — ABNORMAL LOW (ref 13.0–17.0)
MCH: 32.1 pg (ref 26.0–34.0)
MCHC: 33 g/dL (ref 30.0–36.0)
MCV: 97.3 fL (ref 78.0–100.0)
Platelets: 182 10*3/uL (ref 150–400)
RBC: 3.74 MIL/uL — ABNORMAL LOW (ref 4.22–5.81)
RDW: 16.5 % — ABNORMAL HIGH (ref 11.5–15.5)
WBC: 7.9 10*3/uL (ref 4.0–10.5)

## 2012-06-09 MED ORDER — DOXERCALCIFEROL 4 MCG/2ML IV SOLN
INTRAVENOUS | Status: AC
  Administered 2012-06-09: 1.5 ug via INTRAVENOUS
  Filled 2012-06-09: qty 2

## 2012-06-09 MED ORDER — BOOST / RESOURCE BREEZE PO LIQD: 1.0000 | Freq: Two times a day (BID) | ORAL | Status: DC

## 2012-06-09 NOTE — Progress Notes (Signed)
[facility] Progress Note  Subjective:   States IR unable to declot therefore tunneled catheter was placed.   Objective Filed Vitals:   06/09/12 0830 06/09/12 0900 06/09/12 0930 06/09/12 1000  BP: 136/82 139/105 138/89 117/92  Pulse: 87 77 89 111  Temp:      TempSrc:      Resp:      Height:      Weight:      SpO2:       Physical Exam pre HD weight 123.4 - goal 4.4 General: NAD on HD Heart: RRR Lungs: no wheezes or rales Abdomen: soft obese Extremities: no edema  Dialysis Access: LFA AVF + bruit (deemed clotted); right I-J, Qb 400  Dialysis Orders: Center: [facility] in [place]/ MWF  Time 3:75. EDW 121 kg 2K/2.5Ca Bath LFA AVF BFR 500/ DFR 800. 15g needles. Heparin 4000 bolus/ 2700 mid. Hectorol 1.5 mcg IV/HD  Assessment/Plan:  1. Witnessed Seizure - Presumed secondary to uremia. No history of seizures. None further. Head CT, LE plain films with no acute process. Cr 21 on admission BUN 55; Cr down to 16 pre HD today 2. ESRD/ Dialysis Access - MWF. Ongoing problems with LFA AVF x 2 weeks.HD off schedule F'gram results not in comupter; pt had tunneled catheter placed. K 5.2 - had declot within the last month with Dr. Wyn Quaker. Discussed with patient - he will follow up with Dr. Wyn Quaker next week to see if AVF can be salvagaed. 3. Hypertension/volume - SBPs much better with fluid removal. 110's to 130's. On metoprolol succinate 25 mg QHS . Lungs/CXR clear; getting volume off today; should be at/about EDW post Hd..  4. Anemia - Hgb 12. Hold ESAs for now. Tsat 19%, Ferritin 768. 5. Metabolic bone disease - Ca 8.1 (9 corrected) P 8.9 - not sure why the increase; continue home Renvela 3 and Phoslo 4 with meals. Continue Sensipar 120 mg. Hectorol 1.5 mcg 6. Nutrition - Albumin 3.2. High protein renal diet when appropriate, Multivitamin. Add Breeze. 7. Dispo - Transferring from [facility] to [facility] for outpatient HD. To start there Tuesday - starting there 5/27 with      Sheffield Slider, PA-C [facility] Beeper [phone] 06/09/2012,10:14 AM  LOS: 3 days   Patient seen and examined.  Agree with assessment and plan as above. Pt ready for discharge, discussed with Dr Gonzella Lex.  He will follow up with local surgeon in [place] to see if the AVF is salvagable. For now he has the tunneled HD cath.  He has been set up to dialyze with [facility] at the [facility] unit.   Vinson Moselle  MD [phone] pgr    [phone] cell 06/09/2012, 12:38 PM    Additional Objective Labs: Basic Metabolic Panel:  Recent Labs Lab 06/06/12 0611 06/06/12 1300 06/07/12 0520 06/08/12 0520 06/09/12 0758  NA 143 145 138 139 138  K 5.1 4.5 4.7 5.4* 5.2*  CL 86* 90* 93* 94* 95*  CO2 41* 39* 28 32 21  GLUCOSE 86 83 91 95 89  BUN 66* 68* 42* 38* 59*  CREATININE 21.81* 21.30* 15.27* 13.29* 16.32*  CALCIUM 8.3* 7.6* 8.1* 9.0 8.3*  PHOS  --  5.2*  --   --  8.9*   Liver Function Tests:  Recent Labs Lab 06/06/12 1300 06/09/12 0758  ALBUMIN 2.9* 3.2*   CBC:  Recent Labs Lab 06/06/12 0611 06/07/12 0520 06/09/12 0758  WBC 7.7 8.0 7.9  HGB 11.7* 11.3* 12.0*  HCT 36.1*  34.8* 36.4*  MCV 102.0* 99.7 97.3  PLT 267 153 182  CBG:  Recent Labs Lab 06/06/12 0543  GLUCAP 79   Iron Studies:  Recent Labs  06/06/12 1756  IRON 49  TIBC 256  FERRITIN 768*  Medications:   . calcium acetate  2,668 mg Oral TID WC  . cinacalcet  60 mg Oral BID WC  . doxercalciferol  1.5 mcg Intravenous Q M,W,F-HD  . enoxaparin (LOVENOX) injection  30 mg Subcutaneous Q24H  . feeding supplement  1 Container Oral BID BM  . metoprolol succinate  25 mg Oral QHS  . multivitamin  1 tablet Oral Daily  . multivitamin  1 tablet Oral Daily  . nicotine  14 mg Transdermal Daily  . sevelamer carbonate  2,400 mg Oral TID WC  . sodium chloride  3 mL Intravenous Q12H

## 2012-06-09 NOTE — Progress Notes (Deleted)
1045 06/09/12 nsg RN attempted to place foley catheter to this pt but it would not advance. Dr. Laverle Patter and Dr. Lavera Guise notified.

## 2012-06-09 NOTE — Discharge Summary (Signed)
Physician Discharge Summary  Perry [name] RUE:[id number] DOB: Sep 06, 1970 DOA: 06/06/2012  PCP: No primary provider on file.  Admit date: 06/06/2012 Discharge date: 06/09/2012  Time spent: 40 minutes  Recommendations for Outpatient Follow-up:  1. Home with outpt HD at [facility] for HD Tu, Th and sat 2. He will follow up with his vascular surgeon Consuelo Pandy for his clotted AV fistula in 1 week  Discharge Diagnoses:   Principal Problem:   Seizures  Active Problems:   ESRD on dialysis  Arteriovenous fistula occlusion   Anemia of chronic disease   Secondary hyperparathyroidism (of renal origin)   HTN (hypertension)     Discharge Condition: fair  Diet recommendation: renal  Filed Weights   06/08/12 2017 06/09/12 0630 06/09/12 1036  Weight: 123.605 kg (272 lb 8 oz) 123.4 kg (272 lb 0.8 oz) 120.1 kg (264 lb 12.4 oz)    History of present illness:  Please refer to admission H&P for details, but in brief, 42 y.o. male with ESRD on HD, had been having issues with his AVF for 2 weeks, he had fistulogram and intervention by a vascular surgeon in [place] 2 weeks ago, subsequently had 3 HD treatments but then since 5/15 could not have HD again due to malfunctioning AVF. He was supposed to have a Fistulogram at [place] on 5/21 but overnight had a witnessed -tonic clonic seizure He was taken to [facility] ER and transferred to [place]. Head CT on admission negative.    Hospital Course:  Seizure  -likely metabolic in etiology from missed 3 HD and associated uremia. Had asterixis on presentation . Now resolved  -CT head negative  -no prior hx and further seizure activity. No further w/up for seizure done.  ESRD/Missed HD  -Urgent HD done on admission after rt femoral cathter placed  Renal fn improving. Uremia resolved    Dysfunctional LUE AV graft  Had rt femoral HD catheter placed on admission. HD catheter removed and AV fistulogram attempted  but unsuccessful to  declotting as per IR and had a right sided HD catheter placed for HD. patient had HD today and tolerated well.  -he will follow up with his vascular surgeon Dr Wyn Quaker in [facility] for his clotted AV fistula.  3. Macrocytic anemia  Low iron sats. Normal B12 and folate   4. HTN  continue metoprolol   Secondary hyperparathyroidism  -continue sensipar and phosphate binders     Code Status:FULL   Disposition Plan:home with outpatient HD . patient will follow up with [facility].  Consultants:  Renal  IR   Procedures:  AV fistulogram Rt femoral HD catheter ( placed and removed) Rt CV catheter for HD Antibiotics:  none      Discharge Exam: Filed Vitals:   06/09/12 1000 06/09/12 1024 06/09/12 1036 06/09/12 1056  BP: 117/92 107/76 137/83 119/83  Pulse: 111 104 107 111  Temp:  98.2 F (36.8 C)  98.3 F (36.8 C)  TempSrc:  Oral  Oral  Resp:  18  18  Height:      Weight:   120.1 kg (264 lb 12.4 oz)   SpO2:  99%  100%     General: Middle aged obese male in NAD  HEENT: no pallor, moist mucosa  Cardiovascular: NS1&S2, no murmurs  Respiratory: clear b/l, no added sounds , Rt sided HD catheter Abdomen: soft, NT, ND, BS+  Musculoskeletal: warm, LUE fistula with good thrill, rt femoral HD catheter Removed. CNS: AAOX3  Discharge Instructions  Medication List    TAKE these medications       acetaminophen 500 MG tablet  Commonly known as:  TYLENOL  Take 1,000 mg by mouth every 6 (six) hours as needed for pain.     alprazolam 2 MG tablet  Commonly known as:  XANAX  Take 2 mg by mouth at bedtime.     cinacalcet 60 MG tablet  Commonly known as:  SENSIPAR  Take 60 mg by mouth 2 (two) times daily.     feeding supplement Liqd  Take 1 Container by mouth 2 (two) times daily between meals.     metoprolol succinate 25 MG 24 hr tablet  Commonly known as:  TOPROL-XL  Take 25 mg by mouth at bedtime.     multivitamin Tabs tablet  Take 1 tablet by mouth  daily.     sevelamer carbonate 800 MG tablet  Commonly known as:  RENVELA  Take 2,400 mg by mouth 3 (three) times daily with meals.       No Known Allergies     Follow-up Information   Follow up with DEW,JASON, MD. Schedule an appointment as soon as possible for a visit in 1 week.   Contact information:   [address]   [place] [address] [phone]        The results of significant diagnostics from this hospitalization (including imaging, microbiology, ancillary and laboratory) are listed below for reference.    Significant Diagnostic Studies: Dg Chest 1 View  06/06/2012   *RADIOLOGY REPORT*  Clinical Data: Syncope.  Shortness of breath.  CHEST - 1 VIEW  Comparison: None.  Findings: Heart size and pulmonary vascularity are normal and the lungs are clear.  No osseous abnormality.  IMPRESSION: Normal chest.   Original Report Authenticated By: Francene Boyers, M.D.   Dg Ankle Complete Right  06/06/2012   *RADIOLOGY REPORT*  Clinical Data: Right ankle pain secondary to a fall.  RIGHT ANKLE - COMPLETE 3+ VIEW  Comparison: None.  Findings: There are tiny avulsions from the medial aspect of the talus which I suspect are old.  Minimal osteophytes on the distal tibia.  No acute fracture or dislocation.  Tiny ankle effusion.  Slight dorsal spurring at the talonavicular joint.  IMPRESSION: Tiny joint effusion.  No acute osseous abnormality.   Original Report Authenticated By: Francene Boyers, M.D.   Ct Head Wo Contrast  06/06/2012   *RADIOLOGY REPORT*  Clinical Data: The patient fell and struck his head.  Seizure.  CT HEAD WITHOUT CONTRAST  Technique:  Contiguous axial images were obtained from the base of the skull through the vertex without contrast.  Comparison: None.  Findings: There is no acute intracranial hemorrhage, infarction, or mass lesion.  Brain parenchyma is normal.  Osseous structures are normal.  IMPRESSION: Normal exam.   Original Report Authenticated By: Francene Boyers, M.D.    Ct Cervical Spine Wo Contrast  06/06/2012   *RADIOLOGY REPORT*  Clinical Data: Syncope with a fall and head trauma.  Seizure-like activity.  CT CERVICAL SPINE WITHOUT CONTRAST  Technique:  Multidetector CT imaging of the cervical spine was performed. Multiplanar CT image reconstructions were also generated.  Comparison: None.  Findings: There is no fracture, subluxation or prevertebral soft tissue swelling.  Minimal disc space narrowing at C5-6, with slight anterior osteophytes.  Soft tissues are normal.  IMPRESSION: No acute abnormality of the cervical spine.   Original Report Authenticated By: Francene Boyers, M.D.   Dg Knee Complete 4 Views Right  06/06/2012   *  RADIOLOGY REPORT*  Clinical Data: Right knee pain secondary to a fall.  RIGHT KNEE - COMPLETE 4+ VIEW  Comparison: 01/06/2012  Findings: There is no fracture, dislocation, or joint effusion.  No arthritis.  IMPRESSION: Normal exam.   Original Report Authenticated By: Francene Boyers, M.D.    Microbiology: Recent Results (from the past 240 hour(s))  MRSA PCR SCREENING     Status: None   Collection Time    06/06/12  6:42 PM      Result Value Range Status   MRSA by PCR NEGATIVE  NEGATIVE Final   Comment:            The GeneXpert MRSA Assay (FDA     approved for NASAL specimens     only), is one component of a     comprehensive MRSA colonization     surveillance program. It is not     intended to diagnose MRSA     infection nor to guide or     monitor treatment for     MRSA infections.     Labs: Basic Metabolic Panel:  Recent Labs Lab 06/06/12 0611 06/06/12 1300 06/07/12 0520 06/08/12 0520 06/09/12 0758  NA 143 145 138 139 138  K 5.1 4.5 4.7 5.4* 5.2*  CL 86* 90* 93* 94* 95*  CO2 41* 39* 28 32 21  GLUCOSE 86 83 91 95 89  BUN 66* 68* 42* 38* 59*  CREATININE 21.81* 21.30* 15.27* 13.29* 16.32*  CALCIUM 8.3* 7.6* 8.1* 9.0 8.3*  PHOS  --  5.2*  --   --  8.9*   Liver Function Tests:  Recent Labs Lab 06/06/12 1300  06/09/12 0758  ALBUMIN 2.9* 3.2*   No results found for this basename: LIPASE, AMYLASE,  in the last 168 hours No results found for this basename: AMMONIA,  in the last 168 hours CBC:  Recent Labs Lab 06/06/12 0611 06/07/12 0520 06/09/12 0758  WBC 7.7 8.0 7.9  HGB 11.7* 11.3* 12.0*  HCT 36.1* 34.8* 36.4*  MCV 102.0* 99.7 97.3  PLT 267 153 182   Cardiac Enzymes: No results found for this basename: CKTOTAL, CKMB, CKMBINDEX, TROPONINI,  in the last 168 hours BNP: BNP (last 3 results) No results found for this basename: PROBNP,  in the last 8760 hours CBG:  Recent Labs Lab 06/06/12 0543  GLUCAP 79       Signed:  Akaysha Cobern  [facility] 06/09/2012, 12:11 PM

## 2012-06-12 DIAGNOSIS — B1910 Unspecified viral hepatitis B without hepatic coma: Secondary | ICD-10-CM | POA: Diagnosis not present

## 2012-06-12 DIAGNOSIS — N186 End stage renal disease: Secondary | ICD-10-CM | POA: Diagnosis not present

## 2012-06-12 DIAGNOSIS — N2581 Secondary hyperparathyroidism of renal origin: Secondary | ICD-10-CM | POA: Diagnosis not present

## 2012-06-13 DIAGNOSIS — I10 Essential (primary) hypertension: Secondary | ICD-10-CM | POA: Diagnosis not present

## 2012-06-13 DIAGNOSIS — N186 End stage renal disease: Secondary | ICD-10-CM | POA: Diagnosis not present

## 2012-06-13 DIAGNOSIS — T82898A Other specified complication of vascular prosthetic devices, implants and grafts, initial encounter: Secondary | ICD-10-CM | POA: Diagnosis not present

## 2012-06-14 DIAGNOSIS — N2581 Secondary hyperparathyroidism of renal origin: Secondary | ICD-10-CM | POA: Diagnosis not present

## 2012-06-14 DIAGNOSIS — B1910 Unspecified viral hepatitis B without hepatic coma: Secondary | ICD-10-CM | POA: Diagnosis not present

## 2012-06-14 DIAGNOSIS — N186 End stage renal disease: Secondary | ICD-10-CM | POA: Diagnosis not present

## 2012-06-15 DIAGNOSIS — N186 End stage renal disease: Secondary | ICD-10-CM | POA: Diagnosis not present

## 2012-06-16 DIAGNOSIS — N2581 Secondary hyperparathyroidism of renal origin: Secondary | ICD-10-CM | POA: Diagnosis not present

## 2012-06-16 DIAGNOSIS — B1910 Unspecified viral hepatitis B without hepatic coma: Secondary | ICD-10-CM | POA: Diagnosis not present

## 2012-06-16 DIAGNOSIS — N186 End stage renal disease: Secondary | ICD-10-CM | POA: Diagnosis not present

## 2012-06-18 ENCOUNTER — Ambulatory Visit: Payer: Self-pay | Admitting: Vascular Surgery

## 2012-06-18 DIAGNOSIS — I120 Hypertensive chronic kidney disease with stage 5 chronic kidney disease or end stage renal disease: Secondary | ICD-10-CM | POA: Diagnosis not present

## 2012-06-18 DIAGNOSIS — F17200 Nicotine dependence, unspecified, uncomplicated: Secondary | ICD-10-CM | POA: Diagnosis not present

## 2012-06-18 DIAGNOSIS — Z79899 Other long term (current) drug therapy: Secondary | ICD-10-CM | POA: Diagnosis not present

## 2012-06-18 DIAGNOSIS — I10 Essential (primary) hypertension: Secondary | ICD-10-CM | POA: Diagnosis not present

## 2012-06-18 DIAGNOSIS — N186 End stage renal disease: Secondary | ICD-10-CM | POA: Diagnosis not present

## 2012-06-18 DIAGNOSIS — Z992 Dependence on renal dialysis: Secondary | ICD-10-CM | POA: Diagnosis not present

## 2012-06-18 DIAGNOSIS — Z9889 Other specified postprocedural states: Secondary | ICD-10-CM | POA: Diagnosis not present

## 2012-06-18 DIAGNOSIS — T82898A Other specified complication of vascular prosthetic devices, implants and grafts, initial encounter: Secondary | ICD-10-CM | POA: Diagnosis not present

## 2012-06-19 DIAGNOSIS — Z23 Encounter for immunization: Secondary | ICD-10-CM | POA: Diagnosis not present

## 2012-06-19 DIAGNOSIS — N186 End stage renal disease: Secondary | ICD-10-CM | POA: Diagnosis not present

## 2012-06-19 DIAGNOSIS — D631 Anemia in chronic kidney disease: Secondary | ICD-10-CM | POA: Diagnosis not present

## 2012-06-19 DIAGNOSIS — N2581 Secondary hyperparathyroidism of renal origin: Secondary | ICD-10-CM | POA: Diagnosis not present

## 2012-06-19 DIAGNOSIS — D509 Iron deficiency anemia, unspecified: Secondary | ICD-10-CM | POA: Diagnosis not present

## 2012-07-03 ENCOUNTER — Ambulatory Visit (HOSPITAL_COMMUNITY)
Admission: RE | Admit: 2012-07-03 | Discharge: 2012-07-03 | Disposition: A | Discharge status: Home / Self Care | Payer: Medicare Other | Source: Ambulatory Visit | Attending: Nephrology | Admitting: Nephrology

## 2012-07-03 ENCOUNTER — Other Ambulatory Visit (HOSPITAL_COMMUNITY): Payer: Self-pay | Admitting: Nephrology

## 2012-07-03 DIAGNOSIS — N186 End stage renal disease: Secondary | ICD-10-CM | POA: Diagnosis not present

## 2012-07-03 DIAGNOSIS — Z992 Dependence on renal dialysis: Secondary | ICD-10-CM

## 2012-07-03 DIAGNOSIS — Z452 Encounter for adjustment and management of vascular access device: Secondary | ICD-10-CM | POA: Insufficient documentation

## 2012-07-03 DIAGNOSIS — N19 Unspecified kidney failure: Secondary | ICD-10-CM | POA: Diagnosis not present

## 2012-07-03 MED ORDER — CHLORHEXIDINE GLUCONATE 4 % EX LIQD
CUTANEOUS | Status: AC
  Filled 2012-07-03: qty 45

## 2012-07-03 NOTE — Procedures (Signed)
Procedure:  Removal of right chest tunneled HD cath Findings:  Entire catheter removed in entirety.

## 2012-07-16 ENCOUNTER — Encounter: Payer: Self-pay | Admitting: Physician Assistant

## 2012-07-16 ENCOUNTER — Ambulatory Visit (INDEPENDENT_AMBULATORY_CARE_PROVIDER_SITE_OTHER): Payer: Medicare Other | Admitting: Physician Assistant

## 2012-07-16 VITALS — BP 110/82 | HR 72 | Temp 98.1°F | Resp 18 | Ht 66.0 in | Wt 262.0 lb

## 2012-07-16 DIAGNOSIS — M25561 Pain in right knee: Secondary | ICD-10-CM

## 2012-07-16 DIAGNOSIS — M25569 Pain in unspecified knee: Secondary | ICD-10-CM

## 2012-07-16 DIAGNOSIS — N186 End stage renal disease: Secondary | ICD-10-CM | POA: Diagnosis not present

## 2012-07-16 DIAGNOSIS — M25562 Pain in left knee: Secondary | ICD-10-CM

## 2012-07-16 NOTE — Progress Notes (Signed)
Patient ID: Perry [name] MRN: [id number], DOB: Jun 14, 1970, 42 y.o. Date of Encounter: 07/16/2012, 10:07 AM    Chief Complaint:  Chief Complaint  Patient presents with  . Establish Care    Sees Dr. [facility] in [place] for ESRF     HPI: 42 y.o. year old male presents as a new patient to our office today. He reports that his prior PCP was in [place]. His LOV there was in January. Pt has moved to [place] so he is re-establishing PCP here sec to location.  He has a h/o ESRD on HD. He was hopitalized in May secondary to a fall/seizure (he had no prior h/o seizure). It was felt that the seizure/monoclonus was secondary to uremia. He had not had his routine dialysis secondary to problem with fistula. He was seen by renal in hte hospital and this was taken care of. As well, he had CT Brain, CT Neck, CXR, XRay of ankle.  Today he reports that since that fall, he has been having pain in medial aspect of both knees. Also, his knees feel like they are giving way. He is using a cane today. Says he never had to use cane in past. Never had any difficulty with ambulation or knees until now. Any time he bends or extends knees, has increased pain in medial aspect.   He says Renal prescribes all of his meds except his PCP was just prescribing the Xanax. He says he will need [place] to do that when refill when refill due. Says he takes 1/2 of 2mg  tablet (1mg ) before he does dialysis and also 1/2 tablet (1mg ) QHS.  He has current bottle with him which was filled by Renal 06/29/12. He says Renal "just filled it one time until he got this appt"      Home Meds: See attached medication section for any medications that were entered at today's visit. The computer does not put those onto this list.The following list is a list of meds entered prior to today's visit.   Current Outpatient Prescriptions on File Prior to Visit  Medication Sig Dispense Refill  . acetaminophen (TYLENOL) 500 MG  tablet Take 1,000 mg by mouth every 6 (six) hours as needed for pain.      [name] alprazolam (XANAX) 2 MG tablet Take 2 mg by mouth at bedtime.       . cinacalcet (SENSIPAR) 60 MG tablet Take 60 mg by mouth 2 (two) times daily.      . multivitamin (RENA-VIT) TABS tablet Take 1 tablet by mouth daily.      . sevelamer (RENVELA) 800 MG tablet Take 2,400 mg by mouth 3 (three) times daily with meals.       No current facility-administered medications on file prior to visit.    Allergies: No Known Allergies    Review of Systems: See HPI for pertinent ROS. All other ROS negative.    Physical Exam: Blood pressure 110/82, pulse 72, temperature 98.1 F (36.7 C), temperature source Oral, resp. rate 18, height 5\' 6"  (1.676 m), weight 262 lb (118.842 kg)., Body mass index is 42.31 kg/(m^2). General: Obese AAM. Appears in no acute distress. Lungs: Clear bilaterally to auscultation without wheezes, rales, or rhonchi. Breathing is unlabored. Heart: Regular rhythm. No murmurs, rubs, or gallops. Msk:  Strength and tone normal for age Extremities: Positive pain with palpation of medial joint lines bilaterally. No other areas of pain with palpation. No effusion, swelling. FROM. Positive grind test bilaterally.  Negative  anterior drawer bilaterally. Neuro: Alert and oriented X 3. Moves all extremities spontaneously. Gait is normal. CNII-XII grossly in tact. Psych:  Responds to questions appropriately with a normal affect.     ASSESSMENT AND PLAN:  42 y.o. year old male with  1. ESRD on dialysis    2. Knee pain, bilateral C/w possible meniscal tear. Will refer to Ortho for furhter eval. - Ambulatory referral to Orthopedic Surgery   Signed, Shon Hale [place], [place], [facility] 07/16/2012 10:07 AM

## 2012-07-17 DIAGNOSIS — N2581 Secondary hyperparathyroidism of renal origin: Secondary | ICD-10-CM | POA: Diagnosis not present

## 2012-07-17 DIAGNOSIS — N186 End stage renal disease: Secondary | ICD-10-CM | POA: Diagnosis not present

## 2012-07-17 DIAGNOSIS — D631 Anemia in chronic kidney disease: Secondary | ICD-10-CM | POA: Diagnosis not present

## 2012-07-25 ENCOUNTER — Encounter: Payer: Self-pay | Admitting: Physician Assistant

## 2012-07-25 MED ORDER — ALPRAZOLAM 2 MG PO TABS: 1.0000 mg | ORAL_TABLET | Freq: Two times a day (BID) | ORAL | Status: DC

## 2012-07-25 NOTE — Telephone Encounter (Signed)
New patient visit 07/16/12.  I called [facility].  Last refill Xanax 44m QD #30 was 06/29/12 by Dr Kathrene Bongo, nephrologist. Need approval for controlled medication.

## 2012-07-26 ENCOUNTER — Telehealth: Payer: Self-pay | Admitting: Physician Assistant

## 2012-07-26 ENCOUNTER — Encounter: Payer: Self-pay | Admitting: Physician Assistant

## 2012-07-27 MED ORDER — ALPRAZOLAM 2 MG PO TABS: 2.0000 mg | ORAL_TABLET | Freq: Every day | ORAL | Status: DC

## 2012-07-27 NOTE — Telephone Encounter (Signed)
This was called in today

## 2012-08-02 DIAGNOSIS — N2581 Secondary hyperparathyroidism of renal origin: Secondary | ICD-10-CM | POA: Diagnosis not present

## 2012-08-02 DIAGNOSIS — N186 End stage renal disease: Secondary | ICD-10-CM | POA: Diagnosis not present

## 2012-08-04 DIAGNOSIS — N2581 Secondary hyperparathyroidism of renal origin: Secondary | ICD-10-CM | POA: Diagnosis not present

## 2012-08-04 DIAGNOSIS — N186 End stage renal disease: Secondary | ICD-10-CM | POA: Diagnosis not present

## 2012-08-10 DIAGNOSIS — M25569 Pain in unspecified knee: Secondary | ICD-10-CM | POA: Diagnosis not present

## 2012-08-16 DIAGNOSIS — N186 End stage renal disease: Secondary | ICD-10-CM | POA: Diagnosis not present

## 2012-08-18 DIAGNOSIS — N2581 Secondary hyperparathyroidism of renal origin: Secondary | ICD-10-CM | POA: Diagnosis not present

## 2012-08-18 DIAGNOSIS — N186 End stage renal disease: Secondary | ICD-10-CM | POA: Diagnosis not present

## 2012-08-18 DIAGNOSIS — D631 Anemia in chronic kidney disease: Secondary | ICD-10-CM | POA: Diagnosis not present

## 2012-08-24 ENCOUNTER — Other Ambulatory Visit: Payer: Self-pay | Admitting: Physician Assistant

## 2012-08-24 ENCOUNTER — Encounter: Payer: Self-pay | Admitting: Physician Assistant

## 2012-08-24 NOTE — Telephone Encounter (Signed)
Refill due 8/11   Need approval for controlled medication.

## 2012-08-26 NOTE — Telephone Encounter (Signed)
Approved. # 30 + 0. 

## 2012-08-27 ENCOUNTER — Other Ambulatory Visit: Payer: Self-pay | Admitting: Physician Assistant

## 2012-08-27 MED ORDER — ALPRAZOLAM 2 MG PO TABS: 2.0000 mg | ORAL_TABLET | Freq: Every day | ORAL | Status: DC

## 2012-08-27 NOTE — Telephone Encounter (Signed)
rx called in

## 2012-09-07 DIAGNOSIS — M25569 Pain in unspecified knee: Secondary | ICD-10-CM | POA: Diagnosis not present

## 2012-09-13 DIAGNOSIS — M25569 Pain in unspecified knee: Secondary | ICD-10-CM | POA: Diagnosis not present

## 2012-09-16 DIAGNOSIS — N186 End stage renal disease: Secondary | ICD-10-CM | POA: Diagnosis not present

## 2012-09-18 DIAGNOSIS — N186 End stage renal disease: Secondary | ICD-10-CM | POA: Diagnosis not present

## 2012-09-18 DIAGNOSIS — D631 Anemia in chronic kidney disease: Secondary | ICD-10-CM | POA: Diagnosis not present

## 2012-09-18 DIAGNOSIS — Z23 Encounter for immunization: Secondary | ICD-10-CM | POA: Diagnosis not present

## 2012-09-18 DIAGNOSIS — N2581 Secondary hyperparathyroidism of renal origin: Secondary | ICD-10-CM | POA: Diagnosis not present

## 2012-09-19 DIAGNOSIS — M25569 Pain in unspecified knee: Secondary | ICD-10-CM | POA: Diagnosis not present

## 2012-09-24 ENCOUNTER — Ambulatory Visit (INDEPENDENT_AMBULATORY_CARE_PROVIDER_SITE_OTHER): Payer: Medicare Other | Admitting: Neurology

## 2012-09-24 ENCOUNTER — Encounter: Payer: Self-pay | Admitting: Neurology

## 2012-09-24 VITALS — BP 134/84 | HR 86 | Ht 68.0 in | Wt 273.0 lb

## 2012-09-24 DIAGNOSIS — I10 Essential (primary) hypertension: Secondary | ICD-10-CM | POA: Diagnosis not present

## 2012-09-24 DIAGNOSIS — I770 Arteriovenous fistula, acquired: Secondary | ICD-10-CM

## 2012-09-24 DIAGNOSIS — N186 End stage renal disease: Secondary | ICD-10-CM | POA: Diagnosis not present

## 2012-09-24 DIAGNOSIS — D638 Anemia in other chronic diseases classified elsewhere: Secondary | ICD-10-CM

## 2012-09-24 DIAGNOSIS — T82898A Other specified complication of vascular prosthetic devices, implants and grafts, initial encounter: Secondary | ICD-10-CM

## 2012-09-24 DIAGNOSIS — R569 Unspecified convulsions: Secondary | ICD-10-CM | POA: Diagnosis not present

## 2012-09-24 NOTE — Progress Notes (Signed)
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, missed dialysis for 5 days, in May 21th 2014, he had a seizure, fell on his left side, seizure lasted about 10 minutes, he had bilateral knee pain, torn his meniscus, he is under the care of orthopedic surgeon Perry [name], he also complains of left hand pain, right shoulder pain since his fall and seizure  He has no history of seizure, he otherwise denies visual change, no lateralized motor or sensory deficit, he also complains of frequent better hand flapping movement when holding his hands in the stop sign position,  Review of System: Full 14 system review of systems performed and notable only for numbness, weakness, tremor,  ALLERGIES: No Known Allergies  HOME MEDICATIONS: Outpatient Prescriptions Prior to Visit  Medication Sig Dispense Refill  . acetaminophen (TYLENOL) 500 MG tablet Take 1,000 mg by mouth every 6 (six) hours as needed for pain.      [name] alprazolam (XANAX) 2 MG tablet TAKE ONE TABLET BY MOUTH AT BEDTIME AS NEEDED  30 tablet  0  . cinacalcet (SENSIPAR) 60 MG tablet Take 60 mg by mouth 2 (two) times daily.      . multivitamin (RENA-VIT) TABS tablet Take 1 tablet by mouth daily.      . sevelamer (RENVELA) 800 MG tablet Take 2,400 mg by mouth 3 (three) times daily with meals.      [name] alprazolam (XANAX) 2 MG tablet Take 1 tablet (2 mg total) by mouth at bedtime.  30 tablet  0     PAST MEDICAL HISTORY: Past Medical History  Diagnosis Date  . Dialysis patient   . Kidney failure   . High blood  pressure     PAST SURGICAL HISTORY: Past Surgical History  Procedure Laterality Date  . Av fistula repair      FAMILY HISTORY: Family History  Problem Relation Age of Onset  . High blood pressure Mother     SOCIAL HISTORY:  History   Social History  . Marital Status: Single    Spouse Name: N/A    Number of Children: 2  . Years of Education: 13   Occupational History  .      Disabled   Social History Main Topics  . Smoking status: Current Every Day Smoker -- 1.00 packs/day for 20 years    Types: Cigarettes  . Smokeless tobacco: Never Used  . Alcohol Use: No  . Drug Use: No  . Sexual Activity: Not on file    Social History Narrative   Patient lives home at home with his wife Perry [name] ). Patient is disabled.    Caffeine-one cup of coffee daily.   Right handed.     PHYSICAL EXAM    Filed Vitals:   09/24/12 0907  BP: 134/84  Pulse: 86  Height: 5\' 8"  (1.727 m)  Weight: 273 lb (123.832 kg)    Body mass index is 41.52 kg/(m^2).   Generalized: In no acute distress  Neck: Supple, no carotid bruits   Cardiac: Regular rate rhythm  Pulmonary: Clear to auscultation bilaterally  Musculoskeletal: No deformity  Neurological examination  Mentation: Alert oriented to time, place, history taking, and causual conversation  Cranial nerve II-XII: Pupils were equal round reactive to light extraocular movements were full, visual field were full on confrontational test. facial sensation and strength were normal. hearing was intact to finger rubbing bilaterally. Uvula tongue midline.  head turning and shoulder shrug and were normal and symmetric.Tongue protrusion into cheek strength was normal.  Motor: normal tone, bulk and strength, .  Sensory: Intact to fine touch, pinprick, preserved vibratory sensation, and proprioception at toes.  Coordination: Normal finger to nose, heel-to-shin bilaterally there was no truncal ataxia  Gait: need to push up to get up from  seated position, cautious, unsteady, wear left knee brace.  Romberg signs: Negative  Deep tendon reflexes: Brachioradialis 2/2, biceps 2/2, triceps 2/2, patellar 2/2, Achilles trace, plantar responses were flexor bilaterally.   DIAGNOSTIC DATA (LABS, IMAGING, TESTING) - I reviewed patient records, labs, notes, testing and imaging myself where available.  Lab Results  Component Value Date   WBC 7.9 06/09/2012   HGB 12.0* 06/09/2012   HCT 36.4* 06/09/2012   MCV 97.3 06/09/2012   PLT 182 06/09/2012      Component Value Date/Time   NA 138 06/09/2012 0758   K 5.2* 06/09/2012 0758   CL 95* 06/09/2012 0758   CO2 21 06/09/2012 0758   GLUCOSE 89 06/09/2012 0758   BUN 59* 06/09/2012 0758   CREATININE 16.32* 06/09/2012 0758   CALCIUM 8.3* 06/09/2012 0758   ALBUMIN 3.2* 06/09/2012 0758   GFRNONAA 3* 06/09/2012 0758   GFRAA 4* 06/09/2012 0758    Lab Results  Component Value Date   VITAMINB12 647 06/06/2012    ASSESSMENT AND PLAN   42 years old [place] American male, with history of end-stage renal disease, on hemodialysis, presenting with seizure after missing dialysis, also complains of bilatera hand asterixis  1.  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  4. return to clinic with Perry [name] in 6 months .      Levert Feinstein, M.D. Ph.D.  [facility] [address], [address] [place], [place] [address] [phone]

## 2012-09-26 ENCOUNTER — Encounter: Payer: Self-pay | Admitting: Physician Assistant

## 2012-09-26 ENCOUNTER — Other Ambulatory Visit: Payer: Self-pay | Admitting: Physician Assistant

## 2012-09-26 ENCOUNTER — Ambulatory Visit (INDEPENDENT_AMBULATORY_CARE_PROVIDER_SITE_OTHER): Payer: Medicare Other | Admitting: Radiology

## 2012-09-26 DIAGNOSIS — T82898A Other specified complication of vascular prosthetic devices, implants and grafts, initial encounter: Secondary | ICD-10-CM

## 2012-09-26 DIAGNOSIS — R569 Unspecified convulsions: Secondary | ICD-10-CM

## 2012-09-26 DIAGNOSIS — I10 Essential (primary) hypertension: Secondary | ICD-10-CM

## 2012-09-26 DIAGNOSIS — N186 End stage renal disease: Secondary | ICD-10-CM

## 2012-09-26 DIAGNOSIS — D638 Anemia in other chronic diseases classified elsewhere: Secondary | ICD-10-CM

## 2012-09-26 MED ORDER — ALPRAZOLAM 2 MG PO TABS: 2.0000 mg | ORAL_TABLET | Freq: Every evening | ORAL | Status: DC | PRN

## 2012-09-26 NOTE — Telephone Encounter (Signed)
rx called in #30 + 1 refill per provider instructions

## 2012-09-28 NOTE — Procedures (Signed)
HISTORY: 42 years old male, presenting with history of end-stage renal disease, on hemodialysis, had a seizure after missing dialysis for 5 days  TECHNIQUE:  16 channel EEG was performed based on standard 10-16 international system. One channel was dedicated to EKG, which has demonstrates normal sinus rhythm of 90 beats per minutes.  Upon awakening, the posterior background activity was well-developed, in alpha range, 8Hz , with amplitude of 45 microvoltage, reactive to eye opening and closure.  There was no evidence of epilepsy for discharge.  Photic stimulation was performed, which induced a symmetric photic driving.  Hyperventilation was not performed   Patient was able to achieve stage II sleep, as evident by sleep spindles and K complex.  CONCLUSION: This is a  normal awake and asleep EEG.  There is no electrodiagnostic evidence of epileptiform discharge

## 2012-10-10 ENCOUNTER — Ambulatory Visit
Admission: RE | Admit: 2012-10-10 | Discharge: 2012-10-10 | Disposition: A | Discharge status: Home / Self Care | Payer: Medicare Other | Source: Ambulatory Visit | Attending: Neurology | Admitting: Neurology

## 2012-10-10 DIAGNOSIS — D638 Anemia in other chronic diseases classified elsewhere: Secondary | ICD-10-CM

## 2012-10-10 DIAGNOSIS — I10 Essential (primary) hypertension: Secondary | ICD-10-CM

## 2012-10-10 DIAGNOSIS — R51 Headache: Secondary | ICD-10-CM | POA: Diagnosis not present

## 2012-10-10 DIAGNOSIS — R569 Unspecified convulsions: Secondary | ICD-10-CM

## 2012-10-10 DIAGNOSIS — N186 End stage renal disease: Secondary | ICD-10-CM

## 2012-10-10 DIAGNOSIS — T82898A Other specified complication of vascular prosthetic devices, implants and grafts, initial encounter: Secondary | ICD-10-CM

## 2012-10-11 NOTE — Progress Notes (Signed)
Quick Note:  Please call patient, essentially normal MRI brain ______

## 2012-10-12 NOTE — Progress Notes (Signed)
Quick Note:  Call pt about his MRI results, pt verbalized understanding. Pt wanted a release form for this information. Advise pt to come in to fill out a release form to obtain this information. ______

## 2012-10-16 DIAGNOSIS — N186 End stage renal disease: Secondary | ICD-10-CM | POA: Diagnosis not present

## 2012-10-18 DIAGNOSIS — D631 Anemia in chronic kidney disease: Secondary | ICD-10-CM | POA: Diagnosis not present

## 2012-10-18 DIAGNOSIS — N186 End stage renal disease: Secondary | ICD-10-CM | POA: Diagnosis not present

## 2012-10-18 DIAGNOSIS — D509 Iron deficiency anemia, unspecified: Secondary | ICD-10-CM | POA: Diagnosis not present

## 2012-10-18 DIAGNOSIS — N2581 Secondary hyperparathyroidism of renal origin: Secondary | ICD-10-CM | POA: Diagnosis not present

## 2012-10-18 DIAGNOSIS — E875 Hyperkalemia: Secondary | ICD-10-CM | POA: Diagnosis not present

## 2012-10-19 ENCOUNTER — Telehealth: Payer: Self-pay | Admitting: Neurology

## 2012-10-20 DIAGNOSIS — N186 End stage renal disease: Secondary | ICD-10-CM | POA: Diagnosis not present

## 2012-10-20 DIAGNOSIS — N2581 Secondary hyperparathyroidism of renal origin: Secondary | ICD-10-CM | POA: Diagnosis not present

## 2012-10-20 DIAGNOSIS — D631 Anemia in chronic kidney disease: Secondary | ICD-10-CM | POA: Diagnosis not present

## 2012-10-20 DIAGNOSIS — E875 Hyperkalemia: Secondary | ICD-10-CM | POA: Diagnosis not present

## 2012-10-20 DIAGNOSIS — D509 Iron deficiency anemia, unspecified: Secondary | ICD-10-CM | POA: Diagnosis not present

## 2012-10-23 DIAGNOSIS — N2581 Secondary hyperparathyroidism of renal origin: Secondary | ICD-10-CM | POA: Diagnosis not present

## 2012-10-23 DIAGNOSIS — E875 Hyperkalemia: Secondary | ICD-10-CM | POA: Diagnosis not present

## 2012-10-23 DIAGNOSIS — N186 End stage renal disease: Secondary | ICD-10-CM | POA: Diagnosis not present

## 2012-10-23 DIAGNOSIS — D631 Anemia in chronic kidney disease: Secondary | ICD-10-CM | POA: Diagnosis not present

## 2012-10-23 DIAGNOSIS — D509 Iron deficiency anemia, unspecified: Secondary | ICD-10-CM | POA: Diagnosis not present

## 2012-10-23 NOTE — Telephone Encounter (Signed)
I called pt and relayed the EEG results.  (normal).  Pt asking for note relating test results and release.  I told him that would make an appt with NP to go over results then discuss note that is needed.

## 2012-10-25 DIAGNOSIS — E875 Hyperkalemia: Secondary | ICD-10-CM | POA: Diagnosis not present

## 2012-10-25 DIAGNOSIS — D509 Iron deficiency anemia, unspecified: Secondary | ICD-10-CM | POA: Diagnosis not present

## 2012-10-25 DIAGNOSIS — D631 Anemia in chronic kidney disease: Secondary | ICD-10-CM | POA: Diagnosis not present

## 2012-10-25 DIAGNOSIS — N186 End stage renal disease: Secondary | ICD-10-CM | POA: Diagnosis not present

## 2012-10-25 DIAGNOSIS — N2581 Secondary hyperparathyroidism of renal origin: Secondary | ICD-10-CM | POA: Diagnosis not present

## 2012-10-26 ENCOUNTER — Encounter: Payer: Self-pay | Admitting: Physician Assistant

## 2012-10-26 ENCOUNTER — Ambulatory Visit: Payer: Self-pay | Admitting: Nurse Practitioner

## 2012-10-27 ENCOUNTER — Telehealth: Payer: Self-pay | Admitting: Physician Assistant

## 2012-10-27 DIAGNOSIS — N186 End stage renal disease: Secondary | ICD-10-CM | POA: Diagnosis not present

## 2012-10-27 DIAGNOSIS — N2581 Secondary hyperparathyroidism of renal origin: Secondary | ICD-10-CM | POA: Diagnosis not present

## 2012-10-27 DIAGNOSIS — E875 Hyperkalemia: Secondary | ICD-10-CM | POA: Diagnosis not present

## 2012-10-27 DIAGNOSIS — D631 Anemia in chronic kidney disease: Secondary | ICD-10-CM | POA: Diagnosis not present

## 2012-10-27 DIAGNOSIS — D509 Iron deficiency anemia, unspecified: Secondary | ICD-10-CM | POA: Diagnosis not present

## 2012-10-29 MED ORDER — ALPRAZOLAM 2 MG PO TABS: 2.0000 mg | ORAL_TABLET | Freq: Every evening | ORAL | Status: DC | PRN

## 2012-10-29 NOTE — Telephone Encounter (Signed)
Rx called + 1

## 2012-10-29 NOTE — Telephone Encounter (Signed)
Approved for #30+ one additional refill. Patient that this amount should last him 2 months and we will not refill it until at least 12/29/12.

## 2012-10-30 DIAGNOSIS — E875 Hyperkalemia: Secondary | ICD-10-CM | POA: Diagnosis not present

## 2012-10-30 DIAGNOSIS — N186 End stage renal disease: Secondary | ICD-10-CM | POA: Diagnosis not present

## 2012-10-30 DIAGNOSIS — D631 Anemia in chronic kidney disease: Secondary | ICD-10-CM | POA: Diagnosis not present

## 2012-10-30 DIAGNOSIS — D509 Iron deficiency anemia, unspecified: Secondary | ICD-10-CM | POA: Diagnosis not present

## 2012-10-30 DIAGNOSIS — N2581 Secondary hyperparathyroidism of renal origin: Secondary | ICD-10-CM | POA: Diagnosis not present

## 2012-11-01 DIAGNOSIS — N186 End stage renal disease: Secondary | ICD-10-CM | POA: Diagnosis not present

## 2012-11-01 DIAGNOSIS — N2581 Secondary hyperparathyroidism of renal origin: Secondary | ICD-10-CM | POA: Diagnosis not present

## 2012-11-01 DIAGNOSIS — D631 Anemia in chronic kidney disease: Secondary | ICD-10-CM | POA: Diagnosis not present

## 2012-11-01 DIAGNOSIS — D509 Iron deficiency anemia, unspecified: Secondary | ICD-10-CM | POA: Diagnosis not present

## 2012-11-01 DIAGNOSIS — E875 Hyperkalemia: Secondary | ICD-10-CM | POA: Diagnosis not present

## 2012-11-02 DIAGNOSIS — D509 Iron deficiency anemia, unspecified: Secondary | ICD-10-CM | POA: Diagnosis not present

## 2012-11-02 DIAGNOSIS — E875 Hyperkalemia: Secondary | ICD-10-CM | POA: Diagnosis not present

## 2012-11-02 DIAGNOSIS — D631 Anemia in chronic kidney disease: Secondary | ICD-10-CM | POA: Diagnosis not present

## 2012-11-02 DIAGNOSIS — N2581 Secondary hyperparathyroidism of renal origin: Secondary | ICD-10-CM | POA: Diagnosis not present

## 2012-11-02 DIAGNOSIS — N186 End stage renal disease: Secondary | ICD-10-CM | POA: Diagnosis not present

## 2012-11-03 DIAGNOSIS — N186 End stage renal disease: Secondary | ICD-10-CM | POA: Diagnosis not present

## 2012-11-03 DIAGNOSIS — N2581 Secondary hyperparathyroidism of renal origin: Secondary | ICD-10-CM | POA: Diagnosis not present

## 2012-11-03 DIAGNOSIS — D631 Anemia in chronic kidney disease: Secondary | ICD-10-CM | POA: Diagnosis not present

## 2012-11-03 DIAGNOSIS — E875 Hyperkalemia: Secondary | ICD-10-CM | POA: Diagnosis not present

## 2012-11-03 DIAGNOSIS — D509 Iron deficiency anemia, unspecified: Secondary | ICD-10-CM | POA: Diagnosis not present

## 2012-11-06 DIAGNOSIS — N186 End stage renal disease: Secondary | ICD-10-CM | POA: Diagnosis not present

## 2012-11-06 DIAGNOSIS — D631 Anemia in chronic kidney disease: Secondary | ICD-10-CM | POA: Diagnosis not present

## 2012-11-06 DIAGNOSIS — N2581 Secondary hyperparathyroidism of renal origin: Secondary | ICD-10-CM | POA: Diagnosis not present

## 2012-11-06 DIAGNOSIS — E875 Hyperkalemia: Secondary | ICD-10-CM | POA: Diagnosis not present

## 2012-11-06 DIAGNOSIS — D509 Iron deficiency anemia, unspecified: Secondary | ICD-10-CM | POA: Diagnosis not present

## 2012-11-07 DIAGNOSIS — D509 Iron deficiency anemia, unspecified: Secondary | ICD-10-CM | POA: Diagnosis not present

## 2012-11-07 DIAGNOSIS — N186 End stage renal disease: Secondary | ICD-10-CM | POA: Diagnosis not present

## 2012-11-07 DIAGNOSIS — R7309 Other abnormal glucose: Secondary | ICD-10-CM | POA: Diagnosis not present

## 2012-11-07 DIAGNOSIS — E785 Hyperlipidemia, unspecified: Secondary | ICD-10-CM | POA: Diagnosis not present

## 2012-11-07 DIAGNOSIS — N2581 Secondary hyperparathyroidism of renal origin: Secondary | ICD-10-CM | POA: Diagnosis not present

## 2012-11-07 DIAGNOSIS — Z79899 Other long term (current) drug therapy: Secondary | ICD-10-CM | POA: Diagnosis not present

## 2012-11-08 DIAGNOSIS — N2581 Secondary hyperparathyroidism of renal origin: Secondary | ICD-10-CM | POA: Diagnosis not present

## 2012-11-08 DIAGNOSIS — R7309 Other abnormal glucose: Secondary | ICD-10-CM | POA: Diagnosis not present

## 2012-11-08 DIAGNOSIS — N186 End stage renal disease: Secondary | ICD-10-CM | POA: Diagnosis not present

## 2012-11-08 DIAGNOSIS — E785 Hyperlipidemia, unspecified: Secondary | ICD-10-CM | POA: Diagnosis not present

## 2012-11-08 DIAGNOSIS — Z79899 Other long term (current) drug therapy: Secondary | ICD-10-CM | POA: Diagnosis not present

## 2012-11-08 DIAGNOSIS — D509 Iron deficiency anemia, unspecified: Secondary | ICD-10-CM | POA: Diagnosis not present

## 2012-11-09 DIAGNOSIS — D509 Iron deficiency anemia, unspecified: Secondary | ICD-10-CM | POA: Diagnosis not present

## 2012-11-09 DIAGNOSIS — N186 End stage renal disease: Secondary | ICD-10-CM | POA: Diagnosis not present

## 2012-11-09 DIAGNOSIS — N2581 Secondary hyperparathyroidism of renal origin: Secondary | ICD-10-CM | POA: Diagnosis not present

## 2012-11-09 DIAGNOSIS — E785 Hyperlipidemia, unspecified: Secondary | ICD-10-CM | POA: Diagnosis not present

## 2012-11-09 DIAGNOSIS — Z79899 Other long term (current) drug therapy: Secondary | ICD-10-CM | POA: Diagnosis not present

## 2012-11-09 DIAGNOSIS — R7309 Other abnormal glucose: Secondary | ICD-10-CM | POA: Diagnosis not present

## 2012-11-12 DIAGNOSIS — M25569 Pain in unspecified knee: Secondary | ICD-10-CM | POA: Diagnosis not present

## 2012-11-12 DIAGNOSIS — R7309 Other abnormal glucose: Secondary | ICD-10-CM | POA: Diagnosis not present

## 2012-11-12 DIAGNOSIS — Z79899 Other long term (current) drug therapy: Secondary | ICD-10-CM | POA: Diagnosis not present

## 2012-11-12 DIAGNOSIS — D509 Iron deficiency anemia, unspecified: Secondary | ICD-10-CM | POA: Diagnosis not present

## 2012-11-12 DIAGNOSIS — N186 End stage renal disease: Secondary | ICD-10-CM | POA: Diagnosis not present

## 2012-11-12 DIAGNOSIS — N2581 Secondary hyperparathyroidism of renal origin: Secondary | ICD-10-CM | POA: Diagnosis not present

## 2012-11-12 DIAGNOSIS — E785 Hyperlipidemia, unspecified: Secondary | ICD-10-CM | POA: Diagnosis not present

## 2012-11-13 DIAGNOSIS — D509 Iron deficiency anemia, unspecified: Secondary | ICD-10-CM | POA: Diagnosis not present

## 2012-11-13 DIAGNOSIS — E785 Hyperlipidemia, unspecified: Secondary | ICD-10-CM | POA: Diagnosis not present

## 2012-11-13 DIAGNOSIS — N186 End stage renal disease: Secondary | ICD-10-CM | POA: Diagnosis not present

## 2012-11-13 DIAGNOSIS — N2581 Secondary hyperparathyroidism of renal origin: Secondary | ICD-10-CM | POA: Diagnosis not present

## 2012-11-13 DIAGNOSIS — R7309 Other abnormal glucose: Secondary | ICD-10-CM | POA: Diagnosis not present

## 2012-11-13 DIAGNOSIS — Z79899 Other long term (current) drug therapy: Secondary | ICD-10-CM | POA: Diagnosis not present

## 2012-11-14 DIAGNOSIS — N2581 Secondary hyperparathyroidism of renal origin: Secondary | ICD-10-CM | POA: Diagnosis not present

## 2012-11-14 DIAGNOSIS — N186 End stage renal disease: Secondary | ICD-10-CM | POA: Diagnosis not present

## 2012-11-14 DIAGNOSIS — D509 Iron deficiency anemia, unspecified: Secondary | ICD-10-CM | POA: Diagnosis not present

## 2012-11-14 DIAGNOSIS — R7309 Other abnormal glucose: Secondary | ICD-10-CM | POA: Diagnosis not present

## 2012-11-14 DIAGNOSIS — Z79899 Other long term (current) drug therapy: Secondary | ICD-10-CM | POA: Diagnosis not present

## 2012-11-14 DIAGNOSIS — E785 Hyperlipidemia, unspecified: Secondary | ICD-10-CM | POA: Diagnosis not present

## 2012-11-15 DIAGNOSIS — D509 Iron deficiency anemia, unspecified: Secondary | ICD-10-CM | POA: Diagnosis not present

## 2012-11-15 DIAGNOSIS — N186 End stage renal disease: Secondary | ICD-10-CM | POA: Diagnosis not present

## 2012-11-15 DIAGNOSIS — Z79899 Other long term (current) drug therapy: Secondary | ICD-10-CM | POA: Diagnosis not present

## 2012-11-15 DIAGNOSIS — N2581 Secondary hyperparathyroidism of renal origin: Secondary | ICD-10-CM | POA: Diagnosis not present

## 2012-11-15 DIAGNOSIS — E785 Hyperlipidemia, unspecified: Secondary | ICD-10-CM | POA: Diagnosis not present

## 2012-11-15 DIAGNOSIS — R7309 Other abnormal glucose: Secondary | ICD-10-CM | POA: Diagnosis not present

## 2012-11-16 DIAGNOSIS — Z79899 Other long term (current) drug therapy: Secondary | ICD-10-CM | POA: Diagnosis not present

## 2012-11-16 DIAGNOSIS — D509 Iron deficiency anemia, unspecified: Secondary | ICD-10-CM | POA: Diagnosis not present

## 2012-11-16 DIAGNOSIS — N186 End stage renal disease: Secondary | ICD-10-CM | POA: Diagnosis not present

## 2012-11-16 DIAGNOSIS — N2581 Secondary hyperparathyroidism of renal origin: Secondary | ICD-10-CM | POA: Diagnosis not present

## 2012-11-16 DIAGNOSIS — R7309 Other abnormal glucose: Secondary | ICD-10-CM | POA: Diagnosis not present

## 2012-11-16 DIAGNOSIS — E785 Hyperlipidemia, unspecified: Secondary | ICD-10-CM | POA: Diagnosis not present

## 2012-11-19 DIAGNOSIS — N2581 Secondary hyperparathyroidism of renal origin: Secondary | ICD-10-CM | POA: Diagnosis not present

## 2012-11-19 DIAGNOSIS — Z23 Encounter for immunization: Secondary | ICD-10-CM | POA: Diagnosis not present

## 2012-11-19 DIAGNOSIS — D509 Iron deficiency anemia, unspecified: Secondary | ICD-10-CM | POA: Diagnosis not present

## 2012-11-19 DIAGNOSIS — N186 End stage renal disease: Secondary | ICD-10-CM | POA: Diagnosis not present

## 2012-11-20 DIAGNOSIS — D509 Iron deficiency anemia, unspecified: Secondary | ICD-10-CM | POA: Diagnosis not present

## 2012-11-20 DIAGNOSIS — Z23 Encounter for immunization: Secondary | ICD-10-CM | POA: Diagnosis not present

## 2012-11-20 DIAGNOSIS — N186 End stage renal disease: Secondary | ICD-10-CM | POA: Diagnosis not present

## 2012-11-20 DIAGNOSIS — N2581 Secondary hyperparathyroidism of renal origin: Secondary | ICD-10-CM | POA: Diagnosis not present

## 2012-11-21 DIAGNOSIS — N2581 Secondary hyperparathyroidism of renal origin: Secondary | ICD-10-CM | POA: Diagnosis not present

## 2012-11-21 DIAGNOSIS — D509 Iron deficiency anemia, unspecified: Secondary | ICD-10-CM | POA: Diagnosis not present

## 2012-11-21 DIAGNOSIS — N186 End stage renal disease: Secondary | ICD-10-CM | POA: Diagnosis not present

## 2012-11-21 DIAGNOSIS — Z23 Encounter for immunization: Secondary | ICD-10-CM | POA: Diagnosis not present

## 2012-11-22 ENCOUNTER — Other Ambulatory Visit: Payer: Self-pay

## 2012-11-22 DIAGNOSIS — N2581 Secondary hyperparathyroidism of renal origin: Secondary | ICD-10-CM | POA: Diagnosis not present

## 2012-11-22 DIAGNOSIS — N186 End stage renal disease: Secondary | ICD-10-CM | POA: Diagnosis not present

## 2012-11-22 DIAGNOSIS — Z23 Encounter for immunization: Secondary | ICD-10-CM | POA: Diagnosis not present

## 2012-11-22 DIAGNOSIS — D509 Iron deficiency anemia, unspecified: Secondary | ICD-10-CM | POA: Diagnosis not present

## 2012-11-23 DIAGNOSIS — D509 Iron deficiency anemia, unspecified: Secondary | ICD-10-CM | POA: Diagnosis not present

## 2012-11-23 DIAGNOSIS — N186 End stage renal disease: Secondary | ICD-10-CM | POA: Diagnosis not present

## 2012-11-23 DIAGNOSIS — Z23 Encounter for immunization: Secondary | ICD-10-CM | POA: Diagnosis not present

## 2012-11-23 DIAGNOSIS — N2581 Secondary hyperparathyroidism of renal origin: Secondary | ICD-10-CM | POA: Diagnosis not present

## 2012-11-26 DIAGNOSIS — N2581 Secondary hyperparathyroidism of renal origin: Secondary | ICD-10-CM | POA: Diagnosis not present

## 2012-11-26 DIAGNOSIS — N186 End stage renal disease: Secondary | ICD-10-CM | POA: Diagnosis not present

## 2012-11-26 DIAGNOSIS — Z23 Encounter for immunization: Secondary | ICD-10-CM | POA: Diagnosis not present

## 2012-11-26 DIAGNOSIS — D509 Iron deficiency anemia, unspecified: Secondary | ICD-10-CM | POA: Diagnosis not present

## 2012-11-27 DIAGNOSIS — D509 Iron deficiency anemia, unspecified: Secondary | ICD-10-CM | POA: Diagnosis not present

## 2012-11-27 DIAGNOSIS — N2581 Secondary hyperparathyroidism of renal origin: Secondary | ICD-10-CM | POA: Diagnosis not present

## 2012-11-27 DIAGNOSIS — Z23 Encounter for immunization: Secondary | ICD-10-CM | POA: Diagnosis not present

## 2012-11-27 DIAGNOSIS — N186 End stage renal disease: Secondary | ICD-10-CM | POA: Diagnosis not present

## 2012-11-28 DIAGNOSIS — N186 End stage renal disease: Secondary | ICD-10-CM | POA: Diagnosis not present

## 2012-11-28 DIAGNOSIS — I871 Compression of vein: Secondary | ICD-10-CM | POA: Diagnosis not present

## 2012-11-28 DIAGNOSIS — N2581 Secondary hyperparathyroidism of renal origin: Secondary | ICD-10-CM | POA: Diagnosis not present

## 2012-11-28 DIAGNOSIS — T82898A Other specified complication of vascular prosthetic devices, implants and grafts, initial encounter: Secondary | ICD-10-CM | POA: Diagnosis not present

## 2012-11-28 DIAGNOSIS — Z23 Encounter for immunization: Secondary | ICD-10-CM | POA: Diagnosis not present

## 2012-11-28 DIAGNOSIS — D509 Iron deficiency anemia, unspecified: Secondary | ICD-10-CM | POA: Diagnosis not present

## 2012-11-29 DIAGNOSIS — N186 End stage renal disease: Secondary | ICD-10-CM | POA: Diagnosis not present

## 2012-11-29 DIAGNOSIS — N2581 Secondary hyperparathyroidism of renal origin: Secondary | ICD-10-CM | POA: Diagnosis not present

## 2012-11-29 DIAGNOSIS — D509 Iron deficiency anemia, unspecified: Secondary | ICD-10-CM | POA: Diagnosis not present

## 2012-11-29 DIAGNOSIS — Z23 Encounter for immunization: Secondary | ICD-10-CM | POA: Diagnosis not present

## 2012-11-30 DIAGNOSIS — I871 Compression of vein: Secondary | ICD-10-CM | POA: Diagnosis not present

## 2012-11-30 DIAGNOSIS — N186 End stage renal disease: Secondary | ICD-10-CM | POA: Diagnosis not present

## 2012-11-30 DIAGNOSIS — T82898A Other specified complication of vascular prosthetic devices, implants and grafts, initial encounter: Secondary | ICD-10-CM | POA: Diagnosis not present

## 2012-12-01 DIAGNOSIS — N2581 Secondary hyperparathyroidism of renal origin: Secondary | ICD-10-CM | POA: Diagnosis not present

## 2012-12-01 DIAGNOSIS — N186 End stage renal disease: Secondary | ICD-10-CM | POA: Diagnosis not present

## 2012-12-03 DIAGNOSIS — N186 End stage renal disease: Secondary | ICD-10-CM | POA: Diagnosis not present

## 2012-12-03 DIAGNOSIS — N2581 Secondary hyperparathyroidism of renal origin: Secondary | ICD-10-CM | POA: Diagnosis not present

## 2012-12-05 DIAGNOSIS — N186 End stage renal disease: Secondary | ICD-10-CM | POA: Diagnosis not present

## 2012-12-05 DIAGNOSIS — N2581 Secondary hyperparathyroidism of renal origin: Secondary | ICD-10-CM | POA: Diagnosis not present

## 2012-12-07 DIAGNOSIS — N186 End stage renal disease: Secondary | ICD-10-CM | POA: Diagnosis not present

## 2012-12-07 DIAGNOSIS — N2581 Secondary hyperparathyroidism of renal origin: Secondary | ICD-10-CM | POA: Diagnosis not present

## 2012-12-07 DIAGNOSIS — Z01810 Encounter for preprocedural cardiovascular examination: Secondary | ICD-10-CM | POA: Diagnosis not present

## 2012-12-07 DIAGNOSIS — I10 Essential (primary) hypertension: Secondary | ICD-10-CM | POA: Diagnosis not present

## 2012-12-10 DIAGNOSIS — N186 End stage renal disease: Secondary | ICD-10-CM | POA: Diagnosis not present

## 2012-12-10 DIAGNOSIS — N2581 Secondary hyperparathyroidism of renal origin: Secondary | ICD-10-CM | POA: Diagnosis not present

## 2012-12-12 DIAGNOSIS — N2581 Secondary hyperparathyroidism of renal origin: Secondary | ICD-10-CM | POA: Diagnosis not present

## 2012-12-12 DIAGNOSIS — N186 End stage renal disease: Secondary | ICD-10-CM | POA: Diagnosis not present

## 2012-12-14 DIAGNOSIS — N2581 Secondary hyperparathyroidism of renal origin: Secondary | ICD-10-CM | POA: Diagnosis not present

## 2012-12-14 DIAGNOSIS — N186 End stage renal disease: Secondary | ICD-10-CM | POA: Diagnosis not present

## 2012-12-16 DIAGNOSIS — N186 End stage renal disease: Secondary | ICD-10-CM | POA: Diagnosis not present

## 2012-12-17 DIAGNOSIS — Z23 Encounter for immunization: Secondary | ICD-10-CM | POA: Diagnosis not present

## 2012-12-17 DIAGNOSIS — N2581 Secondary hyperparathyroidism of renal origin: Secondary | ICD-10-CM | POA: Diagnosis not present

## 2012-12-17 DIAGNOSIS — N186 End stage renal disease: Secondary | ICD-10-CM | POA: Diagnosis not present

## 2012-12-19 ENCOUNTER — Ambulatory Visit: Payer: Self-pay | Admitting: Vascular Surgery

## 2012-12-19 DIAGNOSIS — N2581 Secondary hyperparathyroidism of renal origin: Secondary | ICD-10-CM | POA: Diagnosis not present

## 2012-12-19 DIAGNOSIS — Z01810 Encounter for preprocedural cardiovascular examination: Secondary | ICD-10-CM | POA: Diagnosis not present

## 2012-12-19 DIAGNOSIS — I10 Essential (primary) hypertension: Secondary | ICD-10-CM | POA: Diagnosis not present

## 2012-12-19 DIAGNOSIS — N186 End stage renal disease: Secondary | ICD-10-CM | POA: Diagnosis not present

## 2012-12-19 DIAGNOSIS — Z23 Encounter for immunization: Secondary | ICD-10-CM | POA: Diagnosis not present

## 2012-12-19 LAB — BASIC METABOLIC PANEL
Anion Gap: 7 (ref 7–16)
Calcium, Total: 9.9 mg/dL (ref 8.5–10.1)
Co2: 37 mmol/L — ABNORMAL HIGH (ref 21–32)
Creatinine: 8.62 mg/dL — ABNORMAL HIGH (ref 0.60–1.30)
EGFR (African American): 8 — ABNORMAL LOW
EGFR (Non-African Amer.): 7 — ABNORMAL LOW
Glucose: 89 mg/dL (ref 65–99)
Potassium: 3.9 mmol/L (ref 3.5–5.1)

## 2012-12-19 LAB — CBC
HGB: 13.7 g/dL (ref 13.0–18.0)
MCV: 99 fL (ref 80–100)
RDW: 16.3 % — ABNORMAL HIGH (ref 11.5–14.5)

## 2012-12-21 DIAGNOSIS — Z23 Encounter for immunization: Secondary | ICD-10-CM | POA: Diagnosis not present

## 2012-12-21 DIAGNOSIS — N2581 Secondary hyperparathyroidism of renal origin: Secondary | ICD-10-CM | POA: Diagnosis not present

## 2012-12-21 DIAGNOSIS — N186 End stage renal disease: Secondary | ICD-10-CM | POA: Diagnosis not present

## 2012-12-24 DIAGNOSIS — N186 End stage renal disease: Secondary | ICD-10-CM | POA: Diagnosis not present

## 2012-12-24 DIAGNOSIS — Z23 Encounter for immunization: Secondary | ICD-10-CM | POA: Diagnosis not present

## 2012-12-24 DIAGNOSIS — N2581 Secondary hyperparathyroidism of renal origin: Secondary | ICD-10-CM | POA: Diagnosis not present

## 2012-12-26 DIAGNOSIS — Z23 Encounter for immunization: Secondary | ICD-10-CM | POA: Diagnosis not present

## 2012-12-26 DIAGNOSIS — N2581 Secondary hyperparathyroidism of renal origin: Secondary | ICD-10-CM | POA: Diagnosis not present

## 2012-12-26 DIAGNOSIS — N186 End stage renal disease: Secondary | ICD-10-CM | POA: Diagnosis not present

## 2012-12-26 NOTE — Telephone Encounter (Signed)
Perry [name] Knock to send him prescription for Xanax 2 mg 1 by mouth each bedtime when necessary for #30 with 2 refills ---to a get one prescription to fill on 12/29/12, 1 to fill 01/29/13, one to fill 03/01/13. Tell patient that he cannot fill these  early and that he will not get any further refills until 03/29/13.

## 2012-12-27 ENCOUNTER — Other Ambulatory Visit: Payer: Self-pay | Admitting: Family Medicine

## 2012-12-27 MED ORDER — ALPRAZOLAM 2 MG PO TABS: 2.0000 mg | ORAL_TABLET | Freq: Every evening | ORAL | Status: DC | PRN

## 2012-12-27 NOTE — Telephone Encounter (Signed)
Three month refills printed for provider and faxed to pharmacy

## 2012-12-27 NOTE — Telephone Encounter (Signed)
RX FOR XANAX PLUS REFILLS PRINTED FOR PROVIDER

## 2012-12-28 DIAGNOSIS — Z23 Encounter for immunization: Secondary | ICD-10-CM | POA: Diagnosis not present

## 2012-12-28 DIAGNOSIS — N186 End stage renal disease: Secondary | ICD-10-CM | POA: Diagnosis not present

## 2012-12-28 DIAGNOSIS — N2581 Secondary hyperparathyroidism of renal origin: Secondary | ICD-10-CM | POA: Diagnosis not present

## 2012-12-31 DIAGNOSIS — Z23 Encounter for immunization: Secondary | ICD-10-CM | POA: Diagnosis not present

## 2012-12-31 DIAGNOSIS — N186 End stage renal disease: Secondary | ICD-10-CM | POA: Diagnosis not present

## 2012-12-31 DIAGNOSIS — N2581 Secondary hyperparathyroidism of renal origin: Secondary | ICD-10-CM | POA: Diagnosis not present

## 2013-01-02 DIAGNOSIS — Z23 Encounter for immunization: Secondary | ICD-10-CM | POA: Diagnosis not present

## 2013-01-02 DIAGNOSIS — N186 End stage renal disease: Secondary | ICD-10-CM | POA: Diagnosis not present

## 2013-01-02 DIAGNOSIS — N2581 Secondary hyperparathyroidism of renal origin: Secondary | ICD-10-CM | POA: Diagnosis not present

## 2013-01-03 ENCOUNTER — Ambulatory Visit: Payer: Self-pay | Admitting: Vascular Surgery

## 2013-01-03 DIAGNOSIS — N186 End stage renal disease: Secondary | ICD-10-CM | POA: Diagnosis not present

## 2013-01-03 DIAGNOSIS — I10 Essential (primary) hypertension: Secondary | ICD-10-CM | POA: Diagnosis not present

## 2013-01-03 DIAGNOSIS — Z992 Dependence on renal dialysis: Secondary | ICD-10-CM | POA: Diagnosis not present

## 2013-01-03 DIAGNOSIS — Z79899 Other long term (current) drug therapy: Secondary | ICD-10-CM | POA: Diagnosis not present

## 2013-01-03 DIAGNOSIS — Z8489 Family history of other specified conditions: Secondary | ICD-10-CM | POA: Diagnosis not present

## 2013-01-03 DIAGNOSIS — D649 Anemia, unspecified: Secondary | ICD-10-CM | POA: Diagnosis not present

## 2013-01-03 DIAGNOSIS — F17200 Nicotine dependence, unspecified, uncomplicated: Secondary | ICD-10-CM | POA: Diagnosis not present

## 2013-01-03 DIAGNOSIS — R059 Cough, unspecified: Secondary | ICD-10-CM | POA: Diagnosis not present

## 2013-01-03 DIAGNOSIS — N19 Unspecified kidney failure: Secondary | ICD-10-CM | POA: Diagnosis not present

## 2013-01-03 DIAGNOSIS — G4730 Sleep apnea, unspecified: Secondary | ICD-10-CM | POA: Diagnosis not present

## 2013-01-03 DIAGNOSIS — I120 Hypertensive chronic kidney disease with stage 5 chronic kidney disease or end stage renal disease: Secondary | ICD-10-CM | POA: Diagnosis not present

## 2013-01-04 DIAGNOSIS — Z23 Encounter for immunization: Secondary | ICD-10-CM | POA: Diagnosis not present

## 2013-01-04 DIAGNOSIS — N2581 Secondary hyperparathyroidism of renal origin: Secondary | ICD-10-CM | POA: Diagnosis not present

## 2013-01-04 DIAGNOSIS — N186 End stage renal disease: Secondary | ICD-10-CM | POA: Diagnosis not present

## 2013-01-06 DIAGNOSIS — N2581 Secondary hyperparathyroidism of renal origin: Secondary | ICD-10-CM | POA: Diagnosis not present

## 2013-01-06 DIAGNOSIS — Z23 Encounter for immunization: Secondary | ICD-10-CM | POA: Diagnosis not present

## 2013-01-06 DIAGNOSIS — N186 End stage renal disease: Secondary | ICD-10-CM | POA: Diagnosis not present

## 2013-01-08 DIAGNOSIS — N186 End stage renal disease: Secondary | ICD-10-CM | POA: Diagnosis not present

## 2013-01-08 DIAGNOSIS — N2581 Secondary hyperparathyroidism of renal origin: Secondary | ICD-10-CM | POA: Diagnosis not present

## 2013-01-08 DIAGNOSIS — Z23 Encounter for immunization: Secondary | ICD-10-CM | POA: Diagnosis not present

## 2013-01-11 DIAGNOSIS — N186 End stage renal disease: Secondary | ICD-10-CM | POA: Diagnosis not present

## 2013-01-11 DIAGNOSIS — Z23 Encounter for immunization: Secondary | ICD-10-CM | POA: Diagnosis not present

## 2013-01-11 DIAGNOSIS — N2581 Secondary hyperparathyroidism of renal origin: Secondary | ICD-10-CM | POA: Diagnosis not present

## 2013-01-13 DIAGNOSIS — Z23 Encounter for immunization: Secondary | ICD-10-CM | POA: Diagnosis not present

## 2013-01-13 DIAGNOSIS — N2581 Secondary hyperparathyroidism of renal origin: Secondary | ICD-10-CM | POA: Diagnosis not present

## 2013-01-13 DIAGNOSIS — N186 End stage renal disease: Secondary | ICD-10-CM | POA: Diagnosis not present

## 2013-01-15 DIAGNOSIS — N186 End stage renal disease: Secondary | ICD-10-CM | POA: Diagnosis not present

## 2013-01-15 DIAGNOSIS — N2581 Secondary hyperparathyroidism of renal origin: Secondary | ICD-10-CM | POA: Diagnosis not present

## 2013-01-15 DIAGNOSIS — Z23 Encounter for immunization: Secondary | ICD-10-CM | POA: Diagnosis not present

## 2013-01-16 DIAGNOSIS — N186 End stage renal disease: Secondary | ICD-10-CM | POA: Diagnosis not present

## 2013-01-18 DIAGNOSIS — E878 Other disorders of electrolyte and fluid balance, not elsewhere classified: Secondary | ICD-10-CM | POA: Diagnosis not present

## 2013-01-18 DIAGNOSIS — D509 Iron deficiency anemia, unspecified: Secondary | ICD-10-CM | POA: Diagnosis not present

## 2013-01-18 DIAGNOSIS — N2581 Secondary hyperparathyroidism of renal origin: Secondary | ICD-10-CM | POA: Diagnosis not present

## 2013-01-18 DIAGNOSIS — T82598A Other mechanical complication of other cardiac and vascular devices and implants, initial encounter: Secondary | ICD-10-CM | POA: Diagnosis not present

## 2013-01-18 DIAGNOSIS — N186 End stage renal disease: Secondary | ICD-10-CM | POA: Diagnosis not present

## 2013-01-22 DIAGNOSIS — T82898A Other specified complication of vascular prosthetic devices, implants and grafts, initial encounter: Secondary | ICD-10-CM | POA: Diagnosis not present

## 2013-01-22 DIAGNOSIS — N186 End stage renal disease: Secondary | ICD-10-CM | POA: Diagnosis not present

## 2013-01-22 DIAGNOSIS — I10 Essential (primary) hypertension: Secondary | ICD-10-CM | POA: Diagnosis not present

## 2013-01-29 ENCOUNTER — Encounter: Payer: Self-pay | Admitting: Family Medicine

## 2013-01-29 ENCOUNTER — Ambulatory Visit (INDEPENDENT_AMBULATORY_CARE_PROVIDER_SITE_OTHER): Payer: Medicare Other | Admitting: Family Medicine

## 2013-01-29 VITALS — BP 140/110 | HR 78 | Temp 97.4°F | Resp 20 | Ht 68.0 in | Wt 288.0 lb

## 2013-01-29 DIAGNOSIS — I10 Essential (primary) hypertension: Secondary | ICD-10-CM

## 2013-01-29 DIAGNOSIS — N186 End stage renal disease: Secondary | ICD-10-CM

## 2013-01-29 DIAGNOSIS — Z7189 Other specified counseling: Secondary | ICD-10-CM

## 2013-01-29 DIAGNOSIS — F411 Generalized anxiety disorder: Secondary | ICD-10-CM

## 2013-01-29 DIAGNOSIS — Z992 Dependence on renal dialysis: Secondary | ICD-10-CM

## 2013-01-29 DIAGNOSIS — Z716 Tobacco abuse counseling: Secondary | ICD-10-CM

## 2013-01-29 MED ORDER — ALPRAZOLAM 2 MG PO TABS: 2.0000 mg | ORAL_TABLET | Freq: Every evening | ORAL | Status: DC | PRN

## 2013-01-29 MED ORDER — NICOTINE 21 MG/24HR TD PT24: 21.0000 mg | MEDICATED_PATCH | Freq: Every day | TRANSDERMAL | Status: DC

## 2013-01-29 NOTE — Progress Notes (Signed)
Subjective:    Patient ID: Perry [name], male    DOB: 1970/02/24, 43 y.o.   MRN: [id number]  HPI Patient is a 43 year old African American male with history of end-stage renal disease on Monday Wednesday Friday hemodialysis. He states that his dry weight is typically 124 kg. Today he is currently 130 kg which he states is the reason his blood pressure is elevated at 140/110. He states his blood pressure typically ranges 130s over 80s. He continues to smoke a half a pack to one pack of cigarettes per day. He is interested in options for smoking cessation. He is unsure he has had Prevnar 13. He is mainly here today for refill his Xanax. He takes one half of a 10 mg tablet prior to dialysis and one half of a 10 mg tablet prior to bed. He also uses it sporadically as needed for anxiety attacks. Past Medical History  Diagnosis Date  . Dialysis patient   . Kidney failure   . High blood pressure    Current Outpatient Prescriptions on File Prior to Visit  Medication Sig Dispense Refill  . cinacalcet (SENSIPAR) 60 MG tablet Take 60 mg by mouth 2 (two) times daily.      . multivitamin (RENA-VIT) TABS tablet Take 1 tablet by mouth daily.      . sevelamer (RENVELA) 800 MG tablet Take 2,400 mg by mouth 3 (three) times daily with meals.       No current facility-administered medications on file prior to visit.   No Known Allergies History   Social History  . Marital Status: Single    Spouse Name: N/A    Number of Children: 2  . Years of Education: 13   Occupational History  .      Disabled   Social History Main Topics  . Smoking status: Current Every Day Smoker -- 1.00 packs/day for 20 years    Types: Cigarettes  . Smokeless tobacco: Never Used  . Alcohol Use: No  . Drug Use: No  . Sexual Activity: Not on file   Other Topics Concern  . Not on file   Social History Narrative   Patient lives home at home with his wife Hydrographic surveyor(Crystal ). Patient is disabled.    Caffeine-one cup of coffee  daily.   Right handed.   Past Surgical History  Procedure Laterality Date  . Av fistula repair        Review of Systems  All other systems reviewed and are negative.       Objective:   Physical Exam  Vitals reviewed. Neck: Neck supple. No JVD present.  Cardiovascular: Normal rate, regular rhythm and normal heart sounds.  Exam reveals no gallop and no friction rub.   No murmur heard. Pulmonary/Chest: Effort normal and breath sounds normal. No respiratory distress. He has no wheezes. He has no rales. He exhibits no tenderness.  Abdominal: Soft. Bowel sounds are normal. He exhibits no distension and no mass. There is no tenderness. There is no rebound and no guarding.  Musculoskeletal: He exhibits edema.  Lymphadenopathy:    He has no cervical adenopathy.          Assessment & Plan:  Encounter for smoking cessation counseling - Plan: nicotine (NICODERM CQ) 21 mg/24hr patch  ESRD on dialysis  HTN (hypertension)  GAD (generalized anxiety disorder)  I refilled patient's Xanax 2 mg tablets one half tablet by mouth twice a day as needed for anxiety. I gave him 30 tablets last one  month. He will need to be seen in 6 months for followup.  I also recommended that the patient receive Prevnar 13. He will check with his dialysis doctor to see if he's had that shot yet. Also recommended smoking cessation. I sent her prescription for Nicoderm CQ 21 mg per day patches to his pharmacy. He is unsuccessful this week he can consider starting the patient on Chantix 0.5 mg poqday even with his end-stage renal disease. The patient will monitor his blood pressure closely over the next week. If his blood pressures greater than 140/90 start the patient on amlodipine.

## 2013-02-08 DIAGNOSIS — Z538 Procedure and treatment not carried out for other reasons: Secondary | ICD-10-CM | POA: Diagnosis not present

## 2013-02-08 DIAGNOSIS — Z01818 Encounter for other preprocedural examination: Secondary | ICD-10-CM | POA: Diagnosis not present

## 2013-02-16 DIAGNOSIS — N186 End stage renal disease: Secondary | ICD-10-CM | POA: Diagnosis not present

## 2013-02-18 DIAGNOSIS — N186 End stage renal disease: Secondary | ICD-10-CM | POA: Diagnosis not present

## 2013-02-18 DIAGNOSIS — D509 Iron deficiency anemia, unspecified: Secondary | ICD-10-CM | POA: Diagnosis not present

## 2013-02-18 DIAGNOSIS — N2581 Secondary hyperparathyroidism of renal origin: Secondary | ICD-10-CM | POA: Diagnosis not present

## 2013-02-26 DIAGNOSIS — M1710 Unilateral primary osteoarthritis, unspecified knee: Secondary | ICD-10-CM | POA: Diagnosis not present

## 2013-02-26 DIAGNOSIS — M25569 Pain in unspecified knee: Secondary | ICD-10-CM | POA: Diagnosis not present

## 2013-02-28 ENCOUNTER — Other Ambulatory Visit: Payer: Self-pay | Admitting: Family Medicine

## 2013-02-28 MED ORDER — ALPRAZOLAM 2 MG PO TABS: 2.0000 mg | ORAL_TABLET | Freq: Every evening | ORAL | Status: DC | PRN

## 2013-02-28 NOTE — Telephone Encounter (Signed)
RX faxed to pharmacy.

## 2013-02-28 NOTE — Telephone Encounter (Signed)
Last RF 01/29/13 #30 +0  Last OV 01/29/13  OK refill?

## 2013-02-28 NOTE — Telephone Encounter (Signed)
ok 

## 2013-03-16 DIAGNOSIS — N186 End stage renal disease: Secondary | ICD-10-CM | POA: Diagnosis not present

## 2013-03-18 DIAGNOSIS — D509 Iron deficiency anemia, unspecified: Secondary | ICD-10-CM | POA: Diagnosis not present

## 2013-03-18 DIAGNOSIS — E878 Other disorders of electrolyte and fluid balance, not elsewhere classified: Secondary | ICD-10-CM | POA: Diagnosis not present

## 2013-03-18 DIAGNOSIS — N2581 Secondary hyperparathyroidism of renal origin: Secondary | ICD-10-CM | POA: Diagnosis not present

## 2013-03-18 DIAGNOSIS — Z23 Encounter for immunization: Secondary | ICD-10-CM | POA: Diagnosis not present

## 2013-03-18 DIAGNOSIS — N186 End stage renal disease: Secondary | ICD-10-CM | POA: Diagnosis not present

## 2013-03-19 DIAGNOSIS — N186 End stage renal disease: Secondary | ICD-10-CM | POA: Diagnosis not present

## 2013-03-19 DIAGNOSIS — I10 Essential (primary) hypertension: Secondary | ICD-10-CM | POA: Diagnosis not present

## 2013-03-19 DIAGNOSIS — T82898A Other specified complication of vascular prosthetic devices, implants and grafts, initial encounter: Secondary | ICD-10-CM | POA: Diagnosis not present

## 2013-03-20 DIAGNOSIS — D509 Iron deficiency anemia, unspecified: Secondary | ICD-10-CM | POA: Diagnosis not present

## 2013-03-20 DIAGNOSIS — Z23 Encounter for immunization: Secondary | ICD-10-CM | POA: Diagnosis not present

## 2013-03-20 DIAGNOSIS — E878 Other disorders of electrolyte and fluid balance, not elsewhere classified: Secondary | ICD-10-CM | POA: Diagnosis not present

## 2013-03-20 DIAGNOSIS — N186 End stage renal disease: Secondary | ICD-10-CM | POA: Diagnosis not present

## 2013-03-20 DIAGNOSIS — N2581 Secondary hyperparathyroidism of renal origin: Secondary | ICD-10-CM | POA: Diagnosis not present

## 2013-03-21 ENCOUNTER — Ambulatory Visit: Payer: Self-pay | Admitting: Vascular Surgery

## 2013-03-21 DIAGNOSIS — Z01812 Encounter for preprocedural laboratory examination: Secondary | ICD-10-CM | POA: Diagnosis not present

## 2013-03-21 DIAGNOSIS — N186 End stage renal disease: Secondary | ICD-10-CM | POA: Diagnosis not present

## 2013-03-21 DIAGNOSIS — I120 Hypertensive chronic kidney disease with stage 5 chronic kidney disease or end stage renal disease: Secondary | ICD-10-CM | POA: Diagnosis not present

## 2013-03-21 LAB — BASIC METABOLIC PANEL
Anion Gap: 7 (ref 7–16)
BUN: 29 mg/dL — AB (ref 7–18)
CALCIUM: 8.6 mg/dL (ref 8.5–10.1)
Chloride: 86 mmol/L — ABNORMAL LOW (ref 98–107)
Co2: 42 mmol/L (ref 21–32)
Creatinine: 13.58 mg/dL — ABNORMAL HIGH (ref 0.60–1.30)
EGFR (African American): 5 — ABNORMAL LOW
EGFR (Non-African Amer.): 4 — ABNORMAL LOW
Glucose: 92 mg/dL (ref 65–99)
Osmolality: 276 (ref 275–301)
POTASSIUM: 3.9 mmol/L (ref 3.5–5.1)
Sodium: 135 mmol/L — ABNORMAL LOW (ref 136–145)

## 2013-03-21 LAB — CBC
HCT: 35.9 % — ABNORMAL LOW (ref 40.0–52.0)
HGB: 12.1 g/dL — ABNORMAL LOW (ref 13.0–18.0)
MCH: 33.3 pg (ref 26.0–34.0)
MCHC: 33.8 g/dL (ref 32.0–36.0)
MCV: 99 fL (ref 80–100)
Platelet: 249 10*3/uL (ref 150–440)
RBC: 3.64 10*6/uL — ABNORMAL LOW (ref 4.40–5.90)
RDW: 18.7 % — ABNORMAL HIGH (ref 11.5–14.5)
WBC: 8.6 10*3/uL (ref 3.8–10.6)

## 2013-03-22 DIAGNOSIS — E878 Other disorders of electrolyte and fluid balance, not elsewhere classified: Secondary | ICD-10-CM | POA: Diagnosis not present

## 2013-03-22 DIAGNOSIS — D509 Iron deficiency anemia, unspecified: Secondary | ICD-10-CM | POA: Diagnosis not present

## 2013-03-22 DIAGNOSIS — N2581 Secondary hyperparathyroidism of renal origin: Secondary | ICD-10-CM | POA: Diagnosis not present

## 2013-03-22 DIAGNOSIS — N186 End stage renal disease: Secondary | ICD-10-CM | POA: Diagnosis not present

## 2013-03-22 DIAGNOSIS — Z23 Encounter for immunization: Secondary | ICD-10-CM | POA: Diagnosis not present

## 2013-03-25 ENCOUNTER — Ambulatory Visit: Payer: Medicare Other | Admitting: Nurse Practitioner

## 2013-03-25 DIAGNOSIS — D509 Iron deficiency anemia, unspecified: Secondary | ICD-10-CM | POA: Diagnosis not present

## 2013-03-25 DIAGNOSIS — N2581 Secondary hyperparathyroidism of renal origin: Secondary | ICD-10-CM | POA: Diagnosis not present

## 2013-03-25 DIAGNOSIS — N186 End stage renal disease: Secondary | ICD-10-CM | POA: Diagnosis not present

## 2013-03-25 DIAGNOSIS — Z23 Encounter for immunization: Secondary | ICD-10-CM | POA: Diagnosis not present

## 2013-03-25 DIAGNOSIS — E878 Other disorders of electrolyte and fluid balance, not elsewhere classified: Secondary | ICD-10-CM | POA: Diagnosis not present

## 2013-03-27 DIAGNOSIS — D509 Iron deficiency anemia, unspecified: Secondary | ICD-10-CM | POA: Diagnosis not present

## 2013-03-27 DIAGNOSIS — N2581 Secondary hyperparathyroidism of renal origin: Secondary | ICD-10-CM | POA: Diagnosis not present

## 2013-03-27 DIAGNOSIS — E878 Other disorders of electrolyte and fluid balance, not elsewhere classified: Secondary | ICD-10-CM | POA: Diagnosis not present

## 2013-03-27 DIAGNOSIS — N186 End stage renal disease: Secondary | ICD-10-CM | POA: Diagnosis not present

## 2013-03-27 DIAGNOSIS — Z23 Encounter for immunization: Secondary | ICD-10-CM | POA: Diagnosis not present

## 2013-03-28 ENCOUNTER — Ambulatory Visit: Payer: Self-pay | Admitting: Vascular Surgery

## 2013-03-28 DIAGNOSIS — I10 Essential (primary) hypertension: Secondary | ICD-10-CM | POA: Diagnosis not present

## 2013-03-28 DIAGNOSIS — G4730 Sleep apnea, unspecified: Secondary | ICD-10-CM | POA: Diagnosis not present

## 2013-03-28 DIAGNOSIS — Z992 Dependence on renal dialysis: Secondary | ICD-10-CM | POA: Diagnosis not present

## 2013-03-28 DIAGNOSIS — Z79899 Other long term (current) drug therapy: Secondary | ICD-10-CM | POA: Diagnosis not present

## 2013-03-28 DIAGNOSIS — Z9109 Other allergy status, other than to drugs and biological substances: Secondary | ICD-10-CM | POA: Diagnosis not present

## 2013-03-28 DIAGNOSIS — Z6841 Body Mass Index (BMI) 40.0 and over, adult: Secondary | ICD-10-CM | POA: Diagnosis not present

## 2013-03-28 DIAGNOSIS — N186 End stage renal disease: Secondary | ICD-10-CM | POA: Diagnosis not present

## 2013-03-28 DIAGNOSIS — E669 Obesity, unspecified: Secondary | ICD-10-CM | POA: Diagnosis not present

## 2013-03-28 DIAGNOSIS — I120 Hypertensive chronic kidney disease with stage 5 chronic kidney disease or end stage renal disease: Secondary | ICD-10-CM | POA: Diagnosis not present

## 2013-03-28 DIAGNOSIS — T82898A Other specified complication of vascular prosthetic devices, implants and grafts, initial encounter: Secondary | ICD-10-CM | POA: Diagnosis not present

## 2013-03-28 DIAGNOSIS — Z7982 Long term (current) use of aspirin: Secondary | ICD-10-CM | POA: Diagnosis not present

## 2013-03-28 DIAGNOSIS — F17200 Nicotine dependence, unspecified, uncomplicated: Secondary | ICD-10-CM | POA: Diagnosis not present

## 2013-03-28 DIAGNOSIS — M549 Dorsalgia, unspecified: Secondary | ICD-10-CM | POA: Diagnosis not present

## 2013-03-28 DIAGNOSIS — R609 Edema, unspecified: Secondary | ICD-10-CM | POA: Diagnosis not present

## 2013-03-29 ENCOUNTER — Encounter: Payer: Self-pay | Admitting: Family Medicine

## 2013-03-29 DIAGNOSIS — N2581 Secondary hyperparathyroidism of renal origin: Secondary | ICD-10-CM | POA: Diagnosis not present

## 2013-03-29 DIAGNOSIS — Z23 Encounter for immunization: Secondary | ICD-10-CM | POA: Diagnosis not present

## 2013-03-29 DIAGNOSIS — D509 Iron deficiency anemia, unspecified: Secondary | ICD-10-CM | POA: Diagnosis not present

## 2013-03-29 DIAGNOSIS — N186 End stage renal disease: Secondary | ICD-10-CM | POA: Diagnosis not present

## 2013-03-29 DIAGNOSIS — E878 Other disorders of electrolyte and fluid balance, not elsewhere classified: Secondary | ICD-10-CM | POA: Diagnosis not present

## 2013-03-30 ENCOUNTER — Encounter: Payer: Self-pay | Admitting: Family Medicine

## 2013-04-01 ENCOUNTER — Other Ambulatory Visit: Payer: Self-pay | Admitting: Family Medicine

## 2013-04-01 DIAGNOSIS — E878 Other disorders of electrolyte and fluid balance, not elsewhere classified: Secondary | ICD-10-CM | POA: Diagnosis not present

## 2013-04-01 DIAGNOSIS — N186 End stage renal disease: Secondary | ICD-10-CM | POA: Diagnosis not present

## 2013-04-01 DIAGNOSIS — Z23 Encounter for immunization: Secondary | ICD-10-CM | POA: Diagnosis not present

## 2013-04-01 DIAGNOSIS — D509 Iron deficiency anemia, unspecified: Secondary | ICD-10-CM | POA: Diagnosis not present

## 2013-04-01 DIAGNOSIS — N2581 Secondary hyperparathyroidism of renal origin: Secondary | ICD-10-CM | POA: Diagnosis not present

## 2013-04-01 MED ORDER — ALPRAZOLAM 2 MG PO TABS: 2.0000 mg | ORAL_TABLET | Freq: Every evening | ORAL | Status: DC | PRN

## 2013-04-03 DIAGNOSIS — N2581 Secondary hyperparathyroidism of renal origin: Secondary | ICD-10-CM | POA: Diagnosis not present

## 2013-04-03 DIAGNOSIS — D509 Iron deficiency anemia, unspecified: Secondary | ICD-10-CM | POA: Diagnosis not present

## 2013-04-03 DIAGNOSIS — Z23 Encounter for immunization: Secondary | ICD-10-CM | POA: Diagnosis not present

## 2013-04-03 DIAGNOSIS — N186 End stage renal disease: Secondary | ICD-10-CM | POA: Diagnosis not present

## 2013-04-03 DIAGNOSIS — E878 Other disorders of electrolyte and fluid balance, not elsewhere classified: Secondary | ICD-10-CM | POA: Diagnosis not present

## 2013-04-05 DIAGNOSIS — Z23 Encounter for immunization: Secondary | ICD-10-CM | POA: Diagnosis not present

## 2013-04-05 DIAGNOSIS — N186 End stage renal disease: Secondary | ICD-10-CM | POA: Diagnosis not present

## 2013-04-05 DIAGNOSIS — D509 Iron deficiency anemia, unspecified: Secondary | ICD-10-CM | POA: Diagnosis not present

## 2013-04-05 DIAGNOSIS — E878 Other disorders of electrolyte and fluid balance, not elsewhere classified: Secondary | ICD-10-CM | POA: Diagnosis not present

## 2013-04-05 DIAGNOSIS — N2581 Secondary hyperparathyroidism of renal origin: Secondary | ICD-10-CM | POA: Diagnosis not present

## 2013-04-08 DIAGNOSIS — Z23 Encounter for immunization: Secondary | ICD-10-CM | POA: Diagnosis not present

## 2013-04-08 DIAGNOSIS — D509 Iron deficiency anemia, unspecified: Secondary | ICD-10-CM | POA: Diagnosis not present

## 2013-04-08 DIAGNOSIS — E878 Other disorders of electrolyte and fluid balance, not elsewhere classified: Secondary | ICD-10-CM | POA: Diagnosis not present

## 2013-04-08 DIAGNOSIS — N186 End stage renal disease: Secondary | ICD-10-CM | POA: Diagnosis not present

## 2013-04-08 DIAGNOSIS — N2581 Secondary hyperparathyroidism of renal origin: Secondary | ICD-10-CM | POA: Diagnosis not present

## 2013-04-10 DIAGNOSIS — E878 Other disorders of electrolyte and fluid balance, not elsewhere classified: Secondary | ICD-10-CM | POA: Diagnosis not present

## 2013-04-10 DIAGNOSIS — N2581 Secondary hyperparathyroidism of renal origin: Secondary | ICD-10-CM | POA: Diagnosis not present

## 2013-04-10 DIAGNOSIS — D509 Iron deficiency anemia, unspecified: Secondary | ICD-10-CM | POA: Diagnosis not present

## 2013-04-10 DIAGNOSIS — N186 End stage renal disease: Secondary | ICD-10-CM | POA: Diagnosis not present

## 2013-04-10 DIAGNOSIS — Z23 Encounter for immunization: Secondary | ICD-10-CM | POA: Diagnosis not present

## 2013-04-12 DIAGNOSIS — D509 Iron deficiency anemia, unspecified: Secondary | ICD-10-CM | POA: Diagnosis not present

## 2013-04-12 DIAGNOSIS — N2581 Secondary hyperparathyroidism of renal origin: Secondary | ICD-10-CM | POA: Diagnosis not present

## 2013-04-12 DIAGNOSIS — N186 End stage renal disease: Secondary | ICD-10-CM | POA: Diagnosis not present

## 2013-04-12 DIAGNOSIS — Z23 Encounter for immunization: Secondary | ICD-10-CM | POA: Diagnosis not present

## 2013-04-12 DIAGNOSIS — E878 Other disorders of electrolyte and fluid balance, not elsewhere classified: Secondary | ICD-10-CM | POA: Diagnosis not present

## 2013-04-15 DIAGNOSIS — Z23 Encounter for immunization: Secondary | ICD-10-CM | POA: Diagnosis not present

## 2013-04-15 DIAGNOSIS — E878 Other disorders of electrolyte and fluid balance, not elsewhere classified: Secondary | ICD-10-CM | POA: Diagnosis not present

## 2013-04-15 DIAGNOSIS — N186 End stage renal disease: Secondary | ICD-10-CM | POA: Diagnosis not present

## 2013-04-15 DIAGNOSIS — N2581 Secondary hyperparathyroidism of renal origin: Secondary | ICD-10-CM | POA: Diagnosis not present

## 2013-04-15 DIAGNOSIS — D509 Iron deficiency anemia, unspecified: Secondary | ICD-10-CM | POA: Diagnosis not present

## 2013-04-16 DIAGNOSIS — N186 End stage renal disease: Secondary | ICD-10-CM | POA: Diagnosis not present

## 2013-04-16 DIAGNOSIS — E878 Other disorders of electrolyte and fluid balance, not elsewhere classified: Secondary | ICD-10-CM | POA: Diagnosis not present

## 2013-04-16 DIAGNOSIS — Z23 Encounter for immunization: Secondary | ICD-10-CM | POA: Diagnosis not present

## 2013-04-16 DIAGNOSIS — D509 Iron deficiency anemia, unspecified: Secondary | ICD-10-CM | POA: Diagnosis not present

## 2013-04-16 DIAGNOSIS — N2581 Secondary hyperparathyroidism of renal origin: Secondary | ICD-10-CM | POA: Diagnosis not present

## 2013-04-17 DIAGNOSIS — Z23 Encounter for immunization: Secondary | ICD-10-CM | POA: Diagnosis not present

## 2013-04-17 DIAGNOSIS — N186 End stage renal disease: Secondary | ICD-10-CM | POA: Diagnosis not present

## 2013-04-17 DIAGNOSIS — N2581 Secondary hyperparathyroidism of renal origin: Secondary | ICD-10-CM | POA: Diagnosis not present

## 2013-04-17 DIAGNOSIS — D509 Iron deficiency anemia, unspecified: Secondary | ICD-10-CM | POA: Diagnosis not present

## 2013-04-18 DIAGNOSIS — I10 Essential (primary) hypertension: Secondary | ICD-10-CM | POA: Diagnosis not present

## 2013-04-18 DIAGNOSIS — T82898A Other specified complication of vascular prosthetic devices, implants and grafts, initial encounter: Secondary | ICD-10-CM | POA: Diagnosis not present

## 2013-04-18 DIAGNOSIS — N186 End stage renal disease: Secondary | ICD-10-CM | POA: Diagnosis not present

## 2013-05-02 DIAGNOSIS — I10 Essential (primary) hypertension: Secondary | ICD-10-CM | POA: Diagnosis not present

## 2013-05-02 DIAGNOSIS — N186 End stage renal disease: Secondary | ICD-10-CM | POA: Diagnosis not present

## 2013-05-02 DIAGNOSIS — M79609 Pain in unspecified limb: Secondary | ICD-10-CM | POA: Diagnosis not present

## 2013-05-02 DIAGNOSIS — M542 Cervicalgia: Secondary | ICD-10-CM | POA: Diagnosis not present

## 2013-05-07 ENCOUNTER — Ambulatory Visit: Payer: Self-pay | Admitting: Vascular Surgery

## 2013-05-07 DIAGNOSIS — M542 Cervicalgia: Secondary | ICD-10-CM | POA: Diagnosis not present

## 2013-05-07 DIAGNOSIS — M539 Dorsopathy, unspecified: Secondary | ICD-10-CM | POA: Diagnosis not present

## 2013-05-07 DIAGNOSIS — M79609 Pain in unspecified limb: Secondary | ICD-10-CM | POA: Diagnosis not present

## 2013-05-07 DIAGNOSIS — M7989 Other specified soft tissue disorders: Secondary | ICD-10-CM | POA: Diagnosis not present

## 2013-05-14 ENCOUNTER — Ambulatory Visit: Payer: Self-pay | Admitting: Physician Assistant

## 2013-05-14 DIAGNOSIS — N186 End stage renal disease: Secondary | ICD-10-CM | POA: Diagnosis not present

## 2013-05-14 DIAGNOSIS — Z4901 Encounter for fitting and adjustment of extracorporeal dialysis catheter: Secondary | ICD-10-CM | POA: Diagnosis not present

## 2013-05-16 DIAGNOSIS — N186 End stage renal disease: Secondary | ICD-10-CM | POA: Diagnosis not present

## 2013-05-17 DIAGNOSIS — N186 End stage renal disease: Secondary | ICD-10-CM | POA: Diagnosis not present

## 2013-05-17 DIAGNOSIS — N2581 Secondary hyperparathyroidism of renal origin: Secondary | ICD-10-CM | POA: Diagnosis not present

## 2013-05-20 DIAGNOSIS — N2581 Secondary hyperparathyroidism of renal origin: Secondary | ICD-10-CM | POA: Diagnosis not present

## 2013-05-20 DIAGNOSIS — N186 End stage renal disease: Secondary | ICD-10-CM | POA: Diagnosis not present

## 2013-05-21 ENCOUNTER — Ambulatory Visit: Payer: Self-pay | Admitting: Physician Assistant

## 2013-05-21 DIAGNOSIS — M47812 Spondylosis without myelopathy or radiculopathy, cervical region: Secondary | ICD-10-CM | POA: Diagnosis not present

## 2013-05-21 DIAGNOSIS — M79609 Pain in unspecified limb: Secondary | ICD-10-CM | POA: Diagnosis not present

## 2013-05-21 DIAGNOSIS — M542 Cervicalgia: Secondary | ICD-10-CM | POA: Diagnosis not present

## 2013-05-22 DIAGNOSIS — N186 End stage renal disease: Secondary | ICD-10-CM | POA: Diagnosis not present

## 2013-05-22 DIAGNOSIS — N2581 Secondary hyperparathyroidism of renal origin: Secondary | ICD-10-CM | POA: Diagnosis not present

## 2013-05-24 DIAGNOSIS — N2581 Secondary hyperparathyroidism of renal origin: Secondary | ICD-10-CM | POA: Diagnosis not present

## 2013-05-24 DIAGNOSIS — N186 End stage renal disease: Secondary | ICD-10-CM | POA: Diagnosis not present

## 2013-05-27 DIAGNOSIS — N186 End stage renal disease: Secondary | ICD-10-CM | POA: Diagnosis not present

## 2013-05-27 DIAGNOSIS — N2581 Secondary hyperparathyroidism of renal origin: Secondary | ICD-10-CM | POA: Diagnosis not present

## 2013-05-28 DIAGNOSIS — K769 Liver disease, unspecified: Secondary | ICD-10-CM | POA: Diagnosis not present

## 2013-05-28 DIAGNOSIS — D631 Anemia in chronic kidney disease: Secondary | ICD-10-CM | POA: Diagnosis not present

## 2013-05-28 DIAGNOSIS — D518 Other vitamin B12 deficiency anemias: Secondary | ICD-10-CM | POA: Diagnosis not present

## 2013-05-28 DIAGNOSIS — N186 End stage renal disease: Secondary | ICD-10-CM | POA: Diagnosis not present

## 2013-05-28 DIAGNOSIS — N2581 Secondary hyperparathyroidism of renal origin: Secondary | ICD-10-CM | POA: Diagnosis not present

## 2013-05-28 DIAGNOSIS — Z131 Encounter for screening for diabetes mellitus: Secondary | ICD-10-CM | POA: Diagnosis not present

## 2013-05-28 DIAGNOSIS — N2589 Other disorders resulting from impaired renal tubular function: Secondary | ICD-10-CM | POA: Diagnosis not present

## 2013-05-28 DIAGNOSIS — N039 Chronic nephritic syndrome with unspecified morphologic changes: Secondary | ICD-10-CM | POA: Diagnosis not present

## 2013-05-28 DIAGNOSIS — D509 Iron deficiency anemia, unspecified: Secondary | ICD-10-CM | POA: Diagnosis not present

## 2013-05-29 DIAGNOSIS — D631 Anemia in chronic kidney disease: Secondary | ICD-10-CM | POA: Diagnosis not present

## 2013-05-29 DIAGNOSIS — N2589 Other disorders resulting from impaired renal tubular function: Secondary | ICD-10-CM | POA: Diagnosis not present

## 2013-05-29 DIAGNOSIS — N186 End stage renal disease: Secondary | ICD-10-CM | POA: Diagnosis not present

## 2013-05-29 DIAGNOSIS — Z131 Encounter for screening for diabetes mellitus: Secondary | ICD-10-CM | POA: Diagnosis not present

## 2013-05-29 DIAGNOSIS — K769 Liver disease, unspecified: Secondary | ICD-10-CM | POA: Diagnosis not present

## 2013-05-29 DIAGNOSIS — N2581 Secondary hyperparathyroidism of renal origin: Secondary | ICD-10-CM | POA: Diagnosis not present

## 2013-05-29 DIAGNOSIS — E785 Hyperlipidemia, unspecified: Secondary | ICD-10-CM | POA: Diagnosis not present

## 2013-05-30 DIAGNOSIS — K769 Liver disease, unspecified: Secondary | ICD-10-CM | POA: Diagnosis not present

## 2013-05-30 DIAGNOSIS — N2581 Secondary hyperparathyroidism of renal origin: Secondary | ICD-10-CM | POA: Diagnosis not present

## 2013-05-30 DIAGNOSIS — Z131 Encounter for screening for diabetes mellitus: Secondary | ICD-10-CM | POA: Diagnosis not present

## 2013-05-30 DIAGNOSIS — N2589 Other disorders resulting from impaired renal tubular function: Secondary | ICD-10-CM | POA: Diagnosis not present

## 2013-05-30 DIAGNOSIS — N186 End stage renal disease: Secondary | ICD-10-CM | POA: Diagnosis not present

## 2013-05-30 DIAGNOSIS — D631 Anemia in chronic kidney disease: Secondary | ICD-10-CM | POA: Diagnosis not present

## 2013-05-31 DIAGNOSIS — N2589 Other disorders resulting from impaired renal tubular function: Secondary | ICD-10-CM | POA: Diagnosis not present

## 2013-05-31 DIAGNOSIS — D631 Anemia in chronic kidney disease: Secondary | ICD-10-CM | POA: Diagnosis not present

## 2013-05-31 DIAGNOSIS — Z131 Encounter for screening for diabetes mellitus: Secondary | ICD-10-CM | POA: Diagnosis not present

## 2013-05-31 DIAGNOSIS — N2581 Secondary hyperparathyroidism of renal origin: Secondary | ICD-10-CM | POA: Diagnosis not present

## 2013-05-31 DIAGNOSIS — N039 Chronic nephritic syndrome with unspecified morphologic changes: Secondary | ICD-10-CM | POA: Diagnosis not present

## 2013-05-31 DIAGNOSIS — K769 Liver disease, unspecified: Secondary | ICD-10-CM | POA: Diagnosis not present

## 2013-05-31 DIAGNOSIS — N186 End stage renal disease: Secondary | ICD-10-CM | POA: Diagnosis not present

## 2013-06-03 DIAGNOSIS — K769 Liver disease, unspecified: Secondary | ICD-10-CM | POA: Diagnosis not present

## 2013-06-03 DIAGNOSIS — N186 End stage renal disease: Secondary | ICD-10-CM | POA: Diagnosis not present

## 2013-06-03 DIAGNOSIS — Z131 Encounter for screening for diabetes mellitus: Secondary | ICD-10-CM | POA: Diagnosis not present

## 2013-06-03 DIAGNOSIS — D631 Anemia in chronic kidney disease: Secondary | ICD-10-CM | POA: Diagnosis not present

## 2013-06-03 DIAGNOSIS — N2589 Other disorders resulting from impaired renal tubular function: Secondary | ICD-10-CM | POA: Diagnosis not present

## 2013-06-03 DIAGNOSIS — N2581 Secondary hyperparathyroidism of renal origin: Secondary | ICD-10-CM | POA: Diagnosis not present

## 2013-06-04 DIAGNOSIS — N2589 Other disorders resulting from impaired renal tubular function: Secondary | ICD-10-CM | POA: Diagnosis not present

## 2013-06-04 DIAGNOSIS — N2581 Secondary hyperparathyroidism of renal origin: Secondary | ICD-10-CM | POA: Diagnosis not present

## 2013-06-04 DIAGNOSIS — K769 Liver disease, unspecified: Secondary | ICD-10-CM | POA: Diagnosis not present

## 2013-06-04 DIAGNOSIS — D631 Anemia in chronic kidney disease: Secondary | ICD-10-CM | POA: Diagnosis not present

## 2013-06-04 DIAGNOSIS — Z131 Encounter for screening for diabetes mellitus: Secondary | ICD-10-CM | POA: Diagnosis not present

## 2013-06-04 DIAGNOSIS — N039 Chronic nephritic syndrome with unspecified morphologic changes: Secondary | ICD-10-CM | POA: Diagnosis not present

## 2013-06-04 DIAGNOSIS — N186 End stage renal disease: Secondary | ICD-10-CM | POA: Diagnosis not present

## 2013-06-06 DIAGNOSIS — N2589 Other disorders resulting from impaired renal tubular function: Secondary | ICD-10-CM | POA: Diagnosis not present

## 2013-06-06 DIAGNOSIS — D631 Anemia in chronic kidney disease: Secondary | ICD-10-CM | POA: Diagnosis not present

## 2013-06-06 DIAGNOSIS — N186 End stage renal disease: Secondary | ICD-10-CM | POA: Diagnosis not present

## 2013-06-06 DIAGNOSIS — K769 Liver disease, unspecified: Secondary | ICD-10-CM | POA: Diagnosis not present

## 2013-06-06 DIAGNOSIS — N2581 Secondary hyperparathyroidism of renal origin: Secondary | ICD-10-CM | POA: Diagnosis not present

## 2013-06-06 DIAGNOSIS — Z131 Encounter for screening for diabetes mellitus: Secondary | ICD-10-CM | POA: Diagnosis not present

## 2013-06-07 DIAGNOSIS — D631 Anemia in chronic kidney disease: Secondary | ICD-10-CM | POA: Diagnosis not present

## 2013-06-07 DIAGNOSIS — N2589 Other disorders resulting from impaired renal tubular function: Secondary | ICD-10-CM | POA: Diagnosis not present

## 2013-06-07 DIAGNOSIS — N2581 Secondary hyperparathyroidism of renal origin: Secondary | ICD-10-CM | POA: Diagnosis not present

## 2013-06-07 DIAGNOSIS — K769 Liver disease, unspecified: Secondary | ICD-10-CM | POA: Diagnosis not present

## 2013-06-07 DIAGNOSIS — Z131 Encounter for screening for diabetes mellitus: Secondary | ICD-10-CM | POA: Diagnosis not present

## 2013-06-07 DIAGNOSIS — N039 Chronic nephritic syndrome with unspecified morphologic changes: Secondary | ICD-10-CM | POA: Diagnosis not present

## 2013-06-07 DIAGNOSIS — N186 End stage renal disease: Secondary | ICD-10-CM | POA: Diagnosis not present

## 2013-06-08 DIAGNOSIS — N186 End stage renal disease: Secondary | ICD-10-CM | POA: Diagnosis not present

## 2013-06-10 DIAGNOSIS — N186 End stage renal disease: Secondary | ICD-10-CM | POA: Diagnosis not present

## 2013-06-11 DIAGNOSIS — N186 End stage renal disease: Secondary | ICD-10-CM | POA: Diagnosis not present

## 2013-06-13 DIAGNOSIS — N186 End stage renal disease: Secondary | ICD-10-CM | POA: Diagnosis not present

## 2013-06-14 DIAGNOSIS — N186 End stage renal disease: Secondary | ICD-10-CM | POA: Diagnosis not present

## 2013-06-15 DIAGNOSIS — N186 End stage renal disease: Secondary | ICD-10-CM | POA: Diagnosis not present

## 2013-06-16 DIAGNOSIS — N186 End stage renal disease: Secondary | ICD-10-CM | POA: Diagnosis not present

## 2013-06-17 DIAGNOSIS — N039 Chronic nephritic syndrome with unspecified morphologic changes: Secondary | ICD-10-CM | POA: Diagnosis not present

## 2013-06-17 DIAGNOSIS — N186 End stage renal disease: Secondary | ICD-10-CM | POA: Diagnosis not present

## 2013-06-17 DIAGNOSIS — Z79899 Other long term (current) drug therapy: Secondary | ICD-10-CM | POA: Diagnosis not present

## 2013-06-17 DIAGNOSIS — E872 Acidosis, unspecified: Secondary | ICD-10-CM | POA: Diagnosis not present

## 2013-06-17 DIAGNOSIS — N2581 Secondary hyperparathyroidism of renal origin: Secondary | ICD-10-CM | POA: Diagnosis not present

## 2013-06-17 DIAGNOSIS — Z4931 Encounter for adequacy testing for hemodialysis: Secondary | ICD-10-CM | POA: Diagnosis not present

## 2013-06-17 DIAGNOSIS — D509 Iron deficiency anemia, unspecified: Secondary | ICD-10-CM | POA: Diagnosis not present

## 2013-06-17 DIAGNOSIS — E46 Unspecified protein-calorie malnutrition: Secondary | ICD-10-CM | POA: Diagnosis not present

## 2013-06-17 DIAGNOSIS — D631 Anemia in chronic kidney disease: Secondary | ICD-10-CM | POA: Diagnosis not present

## 2013-07-16 DIAGNOSIS — N186 End stage renal disease: Secondary | ICD-10-CM | POA: Diagnosis not present

## 2013-07-18 DIAGNOSIS — D631 Anemia in chronic kidney disease: Secondary | ICD-10-CM | POA: Diagnosis not present

## 2013-07-18 DIAGNOSIS — Z4931 Encounter for adequacy testing for hemodialysis: Secondary | ICD-10-CM | POA: Diagnosis not present

## 2013-07-18 DIAGNOSIS — N2581 Secondary hyperparathyroidism of renal origin: Secondary | ICD-10-CM | POA: Diagnosis not present

## 2013-07-18 DIAGNOSIS — Z992 Dependence on renal dialysis: Secondary | ICD-10-CM | POA: Diagnosis not present

## 2013-07-18 DIAGNOSIS — N186 End stage renal disease: Secondary | ICD-10-CM | POA: Diagnosis not present

## 2013-07-18 DIAGNOSIS — N2589 Other disorders resulting from impaired renal tubular function: Secondary | ICD-10-CM | POA: Diagnosis not present

## 2013-07-18 DIAGNOSIS — D509 Iron deficiency anemia, unspecified: Secondary | ICD-10-CM | POA: Diagnosis not present

## 2013-07-18 DIAGNOSIS — Z131 Encounter for screening for diabetes mellitus: Secondary | ICD-10-CM | POA: Diagnosis not present

## 2013-07-19 DIAGNOSIS — N186 End stage renal disease: Secondary | ICD-10-CM | POA: Diagnosis not present

## 2013-07-19 DIAGNOSIS — Z131 Encounter for screening for diabetes mellitus: Secondary | ICD-10-CM | POA: Diagnosis not present

## 2013-07-19 DIAGNOSIS — D631 Anemia in chronic kidney disease: Secondary | ICD-10-CM | POA: Diagnosis not present

## 2013-07-19 DIAGNOSIS — N039 Chronic nephritic syndrome with unspecified morphologic changes: Secondary | ICD-10-CM | POA: Diagnosis not present

## 2013-07-19 DIAGNOSIS — N2589 Other disorders resulting from impaired renal tubular function: Secondary | ICD-10-CM | POA: Diagnosis not present

## 2013-07-19 DIAGNOSIS — D509 Iron deficiency anemia, unspecified: Secondary | ICD-10-CM | POA: Diagnosis not present

## 2013-07-19 DIAGNOSIS — Z4931 Encounter for adequacy testing for hemodialysis: Secondary | ICD-10-CM | POA: Diagnosis not present

## 2013-07-20 DIAGNOSIS — N2589 Other disorders resulting from impaired renal tubular function: Secondary | ICD-10-CM | POA: Diagnosis not present

## 2013-07-20 DIAGNOSIS — Z131 Encounter for screening for diabetes mellitus: Secondary | ICD-10-CM | POA: Diagnosis not present

## 2013-07-20 DIAGNOSIS — D509 Iron deficiency anemia, unspecified: Secondary | ICD-10-CM | POA: Diagnosis not present

## 2013-07-20 DIAGNOSIS — D631 Anemia in chronic kidney disease: Secondary | ICD-10-CM | POA: Diagnosis not present

## 2013-07-20 DIAGNOSIS — N186 End stage renal disease: Secondary | ICD-10-CM | POA: Diagnosis not present

## 2013-07-20 DIAGNOSIS — Z4931 Encounter for adequacy testing for hemodialysis: Secondary | ICD-10-CM | POA: Diagnosis not present

## 2013-07-20 DIAGNOSIS — N039 Chronic nephritic syndrome with unspecified morphologic changes: Secondary | ICD-10-CM | POA: Diagnosis not present

## 2013-07-22 ENCOUNTER — Encounter: Payer: Self-pay | Admitting: Family Medicine

## 2013-07-22 ENCOUNTER — Other Ambulatory Visit: Payer: Self-pay | Admitting: Family Medicine

## 2013-07-22 DIAGNOSIS — N2589 Other disorders resulting from impaired renal tubular function: Secondary | ICD-10-CM | POA: Diagnosis not present

## 2013-07-22 DIAGNOSIS — E785 Hyperlipidemia, unspecified: Secondary | ICD-10-CM | POA: Diagnosis not present

## 2013-07-22 DIAGNOSIS — N186 End stage renal disease: Secondary | ICD-10-CM | POA: Diagnosis not present

## 2013-07-22 DIAGNOSIS — D631 Anemia in chronic kidney disease: Secondary | ICD-10-CM | POA: Diagnosis not present

## 2013-07-22 DIAGNOSIS — D509 Iron deficiency anemia, unspecified: Secondary | ICD-10-CM | POA: Diagnosis not present

## 2013-07-22 DIAGNOSIS — Z131 Encounter for screening for diabetes mellitus: Secondary | ICD-10-CM | POA: Diagnosis not present

## 2013-07-22 DIAGNOSIS — Z4931 Encounter for adequacy testing for hemodialysis: Secondary | ICD-10-CM | POA: Diagnosis not present

## 2013-07-22 MED ORDER — ALPRAZOLAM 2 MG PO TABS: 2.0000 mg | ORAL_TABLET | Freq: Every evening | ORAL | Status: DC | PRN

## 2013-07-23 DIAGNOSIS — Z4931 Encounter for adequacy testing for hemodialysis: Secondary | ICD-10-CM | POA: Diagnosis not present

## 2013-07-23 DIAGNOSIS — D509 Iron deficiency anemia, unspecified: Secondary | ICD-10-CM | POA: Diagnosis not present

## 2013-07-23 DIAGNOSIS — N2589 Other disorders resulting from impaired renal tubular function: Secondary | ICD-10-CM | POA: Diagnosis not present

## 2013-07-23 DIAGNOSIS — D631 Anemia in chronic kidney disease: Secondary | ICD-10-CM | POA: Diagnosis not present

## 2013-07-23 DIAGNOSIS — Z131 Encounter for screening for diabetes mellitus: Secondary | ICD-10-CM | POA: Diagnosis not present

## 2013-07-23 DIAGNOSIS — N186 End stage renal disease: Secondary | ICD-10-CM | POA: Diagnosis not present

## 2013-07-23 DIAGNOSIS — N039 Chronic nephritic syndrome with unspecified morphologic changes: Secondary | ICD-10-CM | POA: Diagnosis not present

## 2013-07-25 DIAGNOSIS — D509 Iron deficiency anemia, unspecified: Secondary | ICD-10-CM | POA: Diagnosis not present

## 2013-07-25 DIAGNOSIS — N2589 Other disorders resulting from impaired renal tubular function: Secondary | ICD-10-CM | POA: Diagnosis not present

## 2013-07-25 DIAGNOSIS — D631 Anemia in chronic kidney disease: Secondary | ICD-10-CM | POA: Diagnosis not present

## 2013-07-25 DIAGNOSIS — N186 End stage renal disease: Secondary | ICD-10-CM | POA: Diagnosis not present

## 2013-07-25 DIAGNOSIS — Z4931 Encounter for adequacy testing for hemodialysis: Secondary | ICD-10-CM | POA: Diagnosis not present

## 2013-07-25 DIAGNOSIS — Z131 Encounter for screening for diabetes mellitus: Secondary | ICD-10-CM | POA: Diagnosis not present

## 2013-07-26 DIAGNOSIS — N186 End stage renal disease: Secondary | ICD-10-CM | POA: Diagnosis not present

## 2013-07-26 DIAGNOSIS — D509 Iron deficiency anemia, unspecified: Secondary | ICD-10-CM | POA: Diagnosis not present

## 2013-07-26 DIAGNOSIS — Z4931 Encounter for adequacy testing for hemodialysis: Secondary | ICD-10-CM | POA: Diagnosis not present

## 2013-07-26 DIAGNOSIS — Z131 Encounter for screening for diabetes mellitus: Secondary | ICD-10-CM | POA: Diagnosis not present

## 2013-07-26 DIAGNOSIS — D631 Anemia in chronic kidney disease: Secondary | ICD-10-CM | POA: Diagnosis not present

## 2013-07-26 DIAGNOSIS — N2589 Other disorders resulting from impaired renal tubular function: Secondary | ICD-10-CM | POA: Diagnosis not present

## 2013-07-27 DIAGNOSIS — D509 Iron deficiency anemia, unspecified: Secondary | ICD-10-CM | POA: Diagnosis not present

## 2013-07-27 DIAGNOSIS — N186 End stage renal disease: Secondary | ICD-10-CM | POA: Diagnosis not present

## 2013-07-27 DIAGNOSIS — Z4931 Encounter for adequacy testing for hemodialysis: Secondary | ICD-10-CM | POA: Diagnosis not present

## 2013-07-27 DIAGNOSIS — Z131 Encounter for screening for diabetes mellitus: Secondary | ICD-10-CM | POA: Diagnosis not present

## 2013-07-27 DIAGNOSIS — N2589 Other disorders resulting from impaired renal tubular function: Secondary | ICD-10-CM | POA: Diagnosis not present

## 2013-07-27 DIAGNOSIS — D631 Anemia in chronic kidney disease: Secondary | ICD-10-CM | POA: Diagnosis not present

## 2013-07-28 ENCOUNTER — Encounter: Payer: Self-pay | Admitting: Family Medicine

## 2013-07-29 ENCOUNTER — Telehealth: Payer: Self-pay | Admitting: Physician Assistant

## 2013-07-29 ENCOUNTER — Ambulatory Visit: Payer: Self-pay | Admitting: Pain Medicine

## 2013-07-29 DIAGNOSIS — M25569 Pain in unspecified knee: Secondary | ICD-10-CM | POA: Diagnosis not present

## 2013-07-29 DIAGNOSIS — IMO0001 Reserved for inherently not codable concepts without codable children: Secondary | ICD-10-CM | POA: Diagnosis not present

## 2013-07-29 DIAGNOSIS — K219 Gastro-esophageal reflux disease without esophagitis: Secondary | ICD-10-CM | POA: Diagnosis not present

## 2013-07-29 DIAGNOSIS — G894 Chronic pain syndrome: Secondary | ICD-10-CM | POA: Diagnosis not present

## 2013-07-29 DIAGNOSIS — G8929 Other chronic pain: Secondary | ICD-10-CM | POA: Diagnosis not present

## 2013-07-29 DIAGNOSIS — IMO0002 Reserved for concepts with insufficient information to code with codable children: Secondary | ICD-10-CM | POA: Diagnosis not present

## 2013-07-29 DIAGNOSIS — N2589 Other disorders resulting from impaired renal tubular function: Secondary | ICD-10-CM | POA: Diagnosis not present

## 2013-07-29 DIAGNOSIS — D509 Iron deficiency anemia, unspecified: Secondary | ICD-10-CM | POA: Diagnosis not present

## 2013-07-29 DIAGNOSIS — M4716 Other spondylosis with myelopathy, lumbar region: Secondary | ICD-10-CM | POA: Diagnosis not present

## 2013-07-29 DIAGNOSIS — M5137 Other intervertebral disc degeneration, lumbosacral region: Secondary | ICD-10-CM | POA: Diagnosis not present

## 2013-07-29 DIAGNOSIS — Z79899 Other long term (current) drug therapy: Secondary | ICD-10-CM | POA: Diagnosis not present

## 2013-07-29 DIAGNOSIS — I120 Hypertensive chronic kidney disease with stage 5 chronic kidney disease or end stage renal disease: Secondary | ICD-10-CM | POA: Diagnosis not present

## 2013-07-29 DIAGNOSIS — M5412 Radiculopathy, cervical region: Secondary | ICD-10-CM | POA: Diagnosis not present

## 2013-07-29 DIAGNOSIS — N186 End stage renal disease: Secondary | ICD-10-CM | POA: Diagnosis not present

## 2013-07-29 DIAGNOSIS — Z131 Encounter for screening for diabetes mellitus: Secondary | ICD-10-CM | POA: Diagnosis not present

## 2013-07-29 DIAGNOSIS — D631 Anemia in chronic kidney disease: Secondary | ICD-10-CM | POA: Diagnosis not present

## 2013-07-29 DIAGNOSIS — N039 Chronic nephritic syndrome with unspecified morphologic changes: Secondary | ICD-10-CM | POA: Diagnosis not present

## 2013-07-29 DIAGNOSIS — T82898A Other specified complication of vascular prosthetic devices, implants and grafts, initial encounter: Secondary | ICD-10-CM | POA: Diagnosis not present

## 2013-07-29 DIAGNOSIS — Z4931 Encounter for adequacy testing for hemodialysis: Secondary | ICD-10-CM | POA: Diagnosis not present

## 2013-07-29 DIAGNOSIS — G4733 Obstructive sleep apnea (adult) (pediatric): Secondary | ICD-10-CM | POA: Diagnosis not present

## 2013-07-29 NOTE — Telephone Encounter (Signed)
Calling to get refill on xanax  [facility] [place]  If questions call [phone]

## 2013-07-29 NOTE — Telephone Encounter (Signed)
LM on VM that [facility] and to check to see if they received it.If not to call us back and we will call them in again.

## 2013-07-30 DIAGNOSIS — Z4931 Encounter for adequacy testing for hemodialysis: Secondary | ICD-10-CM | POA: Diagnosis not present

## 2013-07-30 DIAGNOSIS — D631 Anemia in chronic kidney disease: Secondary | ICD-10-CM | POA: Diagnosis not present

## 2013-07-30 DIAGNOSIS — Z131 Encounter for screening for diabetes mellitus: Secondary | ICD-10-CM | POA: Diagnosis not present

## 2013-07-30 DIAGNOSIS — D509 Iron deficiency anemia, unspecified: Secondary | ICD-10-CM | POA: Diagnosis not present

## 2013-07-30 DIAGNOSIS — N2589 Other disorders resulting from impaired renal tubular function: Secondary | ICD-10-CM | POA: Diagnosis not present

## 2013-07-30 DIAGNOSIS — N186 End stage renal disease: Secondary | ICD-10-CM | POA: Diagnosis not present

## 2013-07-31 DIAGNOSIS — D631 Anemia in chronic kidney disease: Secondary | ICD-10-CM | POA: Diagnosis not present

## 2013-07-31 DIAGNOSIS — Z4931 Encounter for adequacy testing for hemodialysis: Secondary | ICD-10-CM | POA: Diagnosis not present

## 2013-07-31 DIAGNOSIS — N2589 Other disorders resulting from impaired renal tubular function: Secondary | ICD-10-CM | POA: Diagnosis not present

## 2013-07-31 DIAGNOSIS — N186 End stage renal disease: Secondary | ICD-10-CM | POA: Diagnosis not present

## 2013-07-31 DIAGNOSIS — D509 Iron deficiency anemia, unspecified: Secondary | ICD-10-CM | POA: Diagnosis not present

## 2013-07-31 DIAGNOSIS — Z131 Encounter for screening for diabetes mellitus: Secondary | ICD-10-CM | POA: Diagnosis not present

## 2013-08-02 DIAGNOSIS — N039 Chronic nephritic syndrome with unspecified morphologic changes: Secondary | ICD-10-CM | POA: Diagnosis not present

## 2013-08-02 DIAGNOSIS — N2589 Other disorders resulting from impaired renal tubular function: Secondary | ICD-10-CM | POA: Diagnosis not present

## 2013-08-02 DIAGNOSIS — D631 Anemia in chronic kidney disease: Secondary | ICD-10-CM | POA: Diagnosis not present

## 2013-08-02 DIAGNOSIS — Z4931 Encounter for adequacy testing for hemodialysis: Secondary | ICD-10-CM | POA: Diagnosis not present

## 2013-08-02 DIAGNOSIS — D509 Iron deficiency anemia, unspecified: Secondary | ICD-10-CM | POA: Diagnosis not present

## 2013-08-02 DIAGNOSIS — Z131 Encounter for screening for diabetes mellitus: Secondary | ICD-10-CM | POA: Diagnosis not present

## 2013-08-02 DIAGNOSIS — N186 End stage renal disease: Secondary | ICD-10-CM | POA: Diagnosis not present

## 2013-08-04 DIAGNOSIS — N2589 Other disorders resulting from impaired renal tubular function: Secondary | ICD-10-CM | POA: Diagnosis not present

## 2013-08-04 DIAGNOSIS — D509 Iron deficiency anemia, unspecified: Secondary | ICD-10-CM | POA: Diagnosis not present

## 2013-08-04 DIAGNOSIS — N039 Chronic nephritic syndrome with unspecified morphologic changes: Secondary | ICD-10-CM | POA: Diagnosis not present

## 2013-08-04 DIAGNOSIS — N186 End stage renal disease: Secondary | ICD-10-CM | POA: Diagnosis not present

## 2013-08-04 DIAGNOSIS — Z4931 Encounter for adequacy testing for hemodialysis: Secondary | ICD-10-CM | POA: Diagnosis not present

## 2013-08-04 DIAGNOSIS — D631 Anemia in chronic kidney disease: Secondary | ICD-10-CM | POA: Diagnosis not present

## 2013-08-04 DIAGNOSIS — Z131 Encounter for screening for diabetes mellitus: Secondary | ICD-10-CM | POA: Diagnosis not present

## 2013-08-05 DIAGNOSIS — D631 Anemia in chronic kidney disease: Secondary | ICD-10-CM | POA: Diagnosis not present

## 2013-08-05 DIAGNOSIS — D509 Iron deficiency anemia, unspecified: Secondary | ICD-10-CM | POA: Diagnosis not present

## 2013-08-05 DIAGNOSIS — N2589 Other disorders resulting from impaired renal tubular function: Secondary | ICD-10-CM | POA: Diagnosis not present

## 2013-08-05 DIAGNOSIS — Z4931 Encounter for adequacy testing for hemodialysis: Secondary | ICD-10-CM | POA: Diagnosis not present

## 2013-08-05 DIAGNOSIS — Z131 Encounter for screening for diabetes mellitus: Secondary | ICD-10-CM | POA: Diagnosis not present

## 2013-08-05 DIAGNOSIS — N186 End stage renal disease: Secondary | ICD-10-CM | POA: Diagnosis not present

## 2013-08-06 DIAGNOSIS — N2589 Other disorders resulting from impaired renal tubular function: Secondary | ICD-10-CM | POA: Diagnosis not present

## 2013-08-06 DIAGNOSIS — N039 Chronic nephritic syndrome with unspecified morphologic changes: Secondary | ICD-10-CM | POA: Diagnosis not present

## 2013-08-06 DIAGNOSIS — D631 Anemia in chronic kidney disease: Secondary | ICD-10-CM | POA: Diagnosis not present

## 2013-08-06 DIAGNOSIS — D509 Iron deficiency anemia, unspecified: Secondary | ICD-10-CM | POA: Diagnosis not present

## 2013-08-06 DIAGNOSIS — N186 End stage renal disease: Secondary | ICD-10-CM | POA: Diagnosis not present

## 2013-08-06 DIAGNOSIS — Z131 Encounter for screening for diabetes mellitus: Secondary | ICD-10-CM | POA: Diagnosis not present

## 2013-08-06 DIAGNOSIS — Z4931 Encounter for adequacy testing for hemodialysis: Secondary | ICD-10-CM | POA: Diagnosis not present

## 2013-08-08 DIAGNOSIS — D509 Iron deficiency anemia, unspecified: Secondary | ICD-10-CM | POA: Diagnosis not present

## 2013-08-08 DIAGNOSIS — D631 Anemia in chronic kidney disease: Secondary | ICD-10-CM | POA: Diagnosis not present

## 2013-08-08 DIAGNOSIS — N2589 Other disorders resulting from impaired renal tubular function: Secondary | ICD-10-CM | POA: Diagnosis not present

## 2013-08-08 DIAGNOSIS — Z4931 Encounter for adequacy testing for hemodialysis: Secondary | ICD-10-CM | POA: Diagnosis not present

## 2013-08-08 DIAGNOSIS — N186 End stage renal disease: Secondary | ICD-10-CM | POA: Diagnosis not present

## 2013-08-08 DIAGNOSIS — Z131 Encounter for screening for diabetes mellitus: Secondary | ICD-10-CM | POA: Diagnosis not present

## 2013-08-09 DIAGNOSIS — N186 End stage renal disease: Secondary | ICD-10-CM | POA: Diagnosis not present

## 2013-08-09 DIAGNOSIS — D631 Anemia in chronic kidney disease: Secondary | ICD-10-CM | POA: Diagnosis not present

## 2013-08-09 DIAGNOSIS — Z131 Encounter for screening for diabetes mellitus: Secondary | ICD-10-CM | POA: Diagnosis not present

## 2013-08-09 DIAGNOSIS — N039 Chronic nephritic syndrome with unspecified morphologic changes: Secondary | ICD-10-CM | POA: Diagnosis not present

## 2013-08-09 DIAGNOSIS — N2589 Other disorders resulting from impaired renal tubular function: Secondary | ICD-10-CM | POA: Diagnosis not present

## 2013-08-09 DIAGNOSIS — Z4931 Encounter for adequacy testing for hemodialysis: Secondary | ICD-10-CM | POA: Diagnosis not present

## 2013-08-09 DIAGNOSIS — D509 Iron deficiency anemia, unspecified: Secondary | ICD-10-CM | POA: Diagnosis not present

## 2013-08-10 DIAGNOSIS — Z4931 Encounter for adequacy testing for hemodialysis: Secondary | ICD-10-CM | POA: Diagnosis not present

## 2013-08-10 DIAGNOSIS — D631 Anemia in chronic kidney disease: Secondary | ICD-10-CM | POA: Diagnosis not present

## 2013-08-10 DIAGNOSIS — Z131 Encounter for screening for diabetes mellitus: Secondary | ICD-10-CM | POA: Diagnosis not present

## 2013-08-10 DIAGNOSIS — N186 End stage renal disease: Secondary | ICD-10-CM | POA: Diagnosis not present

## 2013-08-10 DIAGNOSIS — N2589 Other disorders resulting from impaired renal tubular function: Secondary | ICD-10-CM | POA: Diagnosis not present

## 2013-08-10 DIAGNOSIS — D509 Iron deficiency anemia, unspecified: Secondary | ICD-10-CM | POA: Diagnosis not present

## 2013-08-12 DIAGNOSIS — Z131 Encounter for screening for diabetes mellitus: Secondary | ICD-10-CM | POA: Diagnosis not present

## 2013-08-12 DIAGNOSIS — D631 Anemia in chronic kidney disease: Secondary | ICD-10-CM | POA: Diagnosis not present

## 2013-08-12 DIAGNOSIS — D509 Iron deficiency anemia, unspecified: Secondary | ICD-10-CM | POA: Diagnosis not present

## 2013-08-12 DIAGNOSIS — N039 Chronic nephritic syndrome with unspecified morphologic changes: Secondary | ICD-10-CM | POA: Diagnosis not present

## 2013-08-12 DIAGNOSIS — N186 End stage renal disease: Secondary | ICD-10-CM | POA: Diagnosis not present

## 2013-08-12 DIAGNOSIS — Z4931 Encounter for adequacy testing for hemodialysis: Secondary | ICD-10-CM | POA: Diagnosis not present

## 2013-08-12 DIAGNOSIS — N2589 Other disorders resulting from impaired renal tubular function: Secondary | ICD-10-CM | POA: Diagnosis not present

## 2013-08-13 DIAGNOSIS — Z131 Encounter for screening for diabetes mellitus: Secondary | ICD-10-CM | POA: Diagnosis not present

## 2013-08-13 DIAGNOSIS — Z4931 Encounter for adequacy testing for hemodialysis: Secondary | ICD-10-CM | POA: Diagnosis not present

## 2013-08-13 DIAGNOSIS — N2589 Other disorders resulting from impaired renal tubular function: Secondary | ICD-10-CM | POA: Diagnosis not present

## 2013-08-13 DIAGNOSIS — D631 Anemia in chronic kidney disease: Secondary | ICD-10-CM | POA: Diagnosis not present

## 2013-08-13 DIAGNOSIS — D509 Iron deficiency anemia, unspecified: Secondary | ICD-10-CM | POA: Diagnosis not present

## 2013-08-13 DIAGNOSIS — N186 End stage renal disease: Secondary | ICD-10-CM | POA: Diagnosis not present

## 2013-08-14 DIAGNOSIS — Z131 Encounter for screening for diabetes mellitus: Secondary | ICD-10-CM | POA: Diagnosis not present

## 2013-08-14 DIAGNOSIS — Z4931 Encounter for adequacy testing for hemodialysis: Secondary | ICD-10-CM | POA: Diagnosis not present

## 2013-08-14 DIAGNOSIS — D509 Iron deficiency anemia, unspecified: Secondary | ICD-10-CM | POA: Diagnosis not present

## 2013-08-14 DIAGNOSIS — D631 Anemia in chronic kidney disease: Secondary | ICD-10-CM | POA: Diagnosis not present

## 2013-08-14 DIAGNOSIS — N2589 Other disorders resulting from impaired renal tubular function: Secondary | ICD-10-CM | POA: Diagnosis not present

## 2013-08-14 DIAGNOSIS — N186 End stage renal disease: Secondary | ICD-10-CM | POA: Diagnosis not present

## 2013-08-14 DIAGNOSIS — N039 Chronic nephritic syndrome with unspecified morphologic changes: Secondary | ICD-10-CM | POA: Diagnosis not present

## 2013-08-14 DIAGNOSIS — F4542 Pain disorder with related psychological factors: Secondary | ICD-10-CM | POA: Diagnosis not present

## 2013-08-16 DIAGNOSIS — N2589 Other disorders resulting from impaired renal tubular function: Secondary | ICD-10-CM | POA: Diagnosis not present

## 2013-08-16 DIAGNOSIS — D509 Iron deficiency anemia, unspecified: Secondary | ICD-10-CM | POA: Diagnosis not present

## 2013-08-16 DIAGNOSIS — Z4931 Encounter for adequacy testing for hemodialysis: Secondary | ICD-10-CM | POA: Diagnosis not present

## 2013-08-16 DIAGNOSIS — D631 Anemia in chronic kidney disease: Secondary | ICD-10-CM | POA: Diagnosis not present

## 2013-08-16 DIAGNOSIS — Z131 Encounter for screening for diabetes mellitus: Secondary | ICD-10-CM | POA: Diagnosis not present

## 2013-08-16 DIAGNOSIS — N186 End stage renal disease: Secondary | ICD-10-CM | POA: Diagnosis not present

## 2013-08-17 DIAGNOSIS — Z4931 Encounter for adequacy testing for hemodialysis: Secondary | ICD-10-CM | POA: Diagnosis not present

## 2013-08-17 DIAGNOSIS — E878 Other disorders of electrolyte and fluid balance, not elsewhere classified: Secondary | ICD-10-CM | POA: Diagnosis not present

## 2013-08-17 DIAGNOSIS — Z992 Dependence on renal dialysis: Secondary | ICD-10-CM | POA: Diagnosis not present

## 2013-08-17 DIAGNOSIS — D631 Anemia in chronic kidney disease: Secondary | ICD-10-CM | POA: Diagnosis not present

## 2013-08-17 DIAGNOSIS — N2581 Secondary hyperparathyroidism of renal origin: Secondary | ICD-10-CM | POA: Diagnosis not present

## 2013-08-17 DIAGNOSIS — Z79899 Other long term (current) drug therapy: Secondary | ICD-10-CM | POA: Diagnosis not present

## 2013-08-17 DIAGNOSIS — N2589 Other disorders resulting from impaired renal tubular function: Secondary | ICD-10-CM | POA: Diagnosis not present

## 2013-08-17 DIAGNOSIS — E46 Unspecified protein-calorie malnutrition: Secondary | ICD-10-CM | POA: Diagnosis not present

## 2013-08-17 DIAGNOSIS — D509 Iron deficiency anemia, unspecified: Secondary | ICD-10-CM | POA: Diagnosis not present

## 2013-08-17 DIAGNOSIS — N186 End stage renal disease: Secondary | ICD-10-CM | POA: Diagnosis not present

## 2013-08-17 DIAGNOSIS — N039 Chronic nephritic syndrome with unspecified morphologic changes: Secondary | ICD-10-CM | POA: Diagnosis not present

## 2013-08-22 DIAGNOSIS — E785 Hyperlipidemia, unspecified: Secondary | ICD-10-CM | POA: Diagnosis not present

## 2013-08-27 DIAGNOSIS — N186 End stage renal disease: Secondary | ICD-10-CM | POA: Diagnosis not present

## 2013-08-27 DIAGNOSIS — I10 Essential (primary) hypertension: Secondary | ICD-10-CM | POA: Diagnosis not present

## 2013-08-27 DIAGNOSIS — T82898A Other specified complication of vascular prosthetic devices, implants and grafts, initial encounter: Secondary | ICD-10-CM | POA: Diagnosis not present

## 2013-09-02 ENCOUNTER — Ambulatory Visit: Payer: Self-pay | Admitting: Pain Medicine

## 2013-09-02 DIAGNOSIS — M5412 Radiculopathy, cervical region: Secondary | ICD-10-CM | POA: Diagnosis not present

## 2013-09-02 DIAGNOSIS — N186 End stage renal disease: Secondary | ICD-10-CM | POA: Diagnosis not present

## 2013-09-02 DIAGNOSIS — Z79899 Other long term (current) drug therapy: Secondary | ICD-10-CM | POA: Diagnosis not present

## 2013-09-02 DIAGNOSIS — I120 Hypertensive chronic kidney disease with stage 5 chronic kidney disease or end stage renal disease: Secondary | ICD-10-CM | POA: Diagnosis not present

## 2013-09-02 DIAGNOSIS — G4733 Obstructive sleep apnea (adult) (pediatric): Secondary | ICD-10-CM | POA: Diagnosis not present

## 2013-09-02 DIAGNOSIS — M4716 Other spondylosis with myelopathy, lumbar region: Secondary | ICD-10-CM | POA: Diagnosis not present

## 2013-09-02 DIAGNOSIS — M5137 Other intervertebral disc degeneration, lumbosacral region: Secondary | ICD-10-CM | POA: Diagnosis not present

## 2013-09-02 DIAGNOSIS — IMO0002 Reserved for concepts with insufficient information to code with codable children: Secondary | ICD-10-CM | POA: Diagnosis not present

## 2013-09-02 DIAGNOSIS — IMO0001 Reserved for inherently not codable concepts without codable children: Secondary | ICD-10-CM | POA: Diagnosis not present

## 2013-09-02 DIAGNOSIS — G894 Chronic pain syndrome: Secondary | ICD-10-CM | POA: Diagnosis not present

## 2013-09-02 DIAGNOSIS — K219 Gastro-esophageal reflux disease without esophagitis: Secondary | ICD-10-CM | POA: Diagnosis not present

## 2013-09-02 DIAGNOSIS — M25569 Pain in unspecified knee: Secondary | ICD-10-CM | POA: Diagnosis not present

## 2013-09-02 DIAGNOSIS — G8929 Other chronic pain: Secondary | ICD-10-CM | POA: Diagnosis not present

## 2013-09-09 ENCOUNTER — Ambulatory Visit: Payer: Self-pay | Admitting: Vascular Surgery

## 2013-09-09 DIAGNOSIS — I120 Hypertensive chronic kidney disease with stage 5 chronic kidney disease or end stage renal disease: Secondary | ICD-10-CM | POA: Diagnosis not present

## 2013-09-09 DIAGNOSIS — T82598A Other mechanical complication of other cardiac and vascular devices and implants, initial encounter: Secondary | ICD-10-CM | POA: Diagnosis not present

## 2013-09-09 DIAGNOSIS — Z9889 Other specified postprocedural states: Secondary | ICD-10-CM | POA: Diagnosis not present

## 2013-09-09 DIAGNOSIS — F17200 Nicotine dependence, unspecified, uncomplicated: Secondary | ICD-10-CM | POA: Diagnosis not present

## 2013-09-09 DIAGNOSIS — Z79899 Other long term (current) drug therapy: Secondary | ICD-10-CM | POA: Diagnosis not present

## 2013-09-09 DIAGNOSIS — N186 End stage renal disease: Secondary | ICD-10-CM | POA: Diagnosis not present

## 2013-09-09 DIAGNOSIS — M542 Cervicalgia: Secondary | ICD-10-CM | POA: Diagnosis not present

## 2013-09-09 DIAGNOSIS — T82898A Other specified complication of vascular prosthetic devices, implants and grafts, initial encounter: Secondary | ICD-10-CM | POA: Diagnosis not present

## 2013-09-09 DIAGNOSIS — Z992 Dependence on renal dialysis: Secondary | ICD-10-CM | POA: Diagnosis not present

## 2013-09-16 DIAGNOSIS — N186 End stage renal disease: Secondary | ICD-10-CM | POA: Diagnosis not present

## 2013-09-17 DIAGNOSIS — Z4931 Encounter for adequacy testing for hemodialysis: Secondary | ICD-10-CM | POA: Diagnosis not present

## 2013-09-17 DIAGNOSIS — N2581 Secondary hyperparathyroidism of renal origin: Secondary | ICD-10-CM | POA: Diagnosis not present

## 2013-09-17 DIAGNOSIS — N039 Chronic nephritic syndrome with unspecified morphologic changes: Secondary | ICD-10-CM | POA: Diagnosis not present

## 2013-09-17 DIAGNOSIS — D509 Iron deficiency anemia, unspecified: Secondary | ICD-10-CM | POA: Diagnosis not present

## 2013-09-17 DIAGNOSIS — Z23 Encounter for immunization: Secondary | ICD-10-CM | POA: Diagnosis not present

## 2013-09-17 DIAGNOSIS — Z79899 Other long term (current) drug therapy: Secondary | ICD-10-CM | POA: Diagnosis not present

## 2013-09-17 DIAGNOSIS — E46 Unspecified protein-calorie malnutrition: Secondary | ICD-10-CM | POA: Diagnosis not present

## 2013-09-17 DIAGNOSIS — E878 Other disorders of electrolyte and fluid balance, not elsewhere classified: Secondary | ICD-10-CM | POA: Diagnosis not present

## 2013-09-17 DIAGNOSIS — N186 End stage renal disease: Secondary | ICD-10-CM | POA: Diagnosis not present

## 2013-09-17 DIAGNOSIS — D631 Anemia in chronic kidney disease: Secondary | ICD-10-CM | POA: Diagnosis not present

## 2013-09-27 ENCOUNTER — Encounter: Payer: Self-pay | Admitting: Family Medicine

## 2013-09-27 MED ORDER — ALPRAZOLAM 2 MG PO TABS: 2.0000 mg | ORAL_TABLET | Freq: Every evening | ORAL | Status: DC | PRN

## 2013-09-27 NOTE — Telephone Encounter (Signed)
Med called to pharm and pt aware via mychart 

## 2013-09-30 ENCOUNTER — Emergency Department (HOSPITAL_COMMUNITY)
Admission: EM | Admit: 2013-09-30 | Discharge: 2013-09-30 | Disposition: A | Discharge status: Home / Self Care | Payer: Medicare Other | Attending: Emergency Medicine | Admitting: Emergency Medicine

## 2013-09-30 ENCOUNTER — Emergency Department (HOSPITAL_COMMUNITY): Payer: Medicare Other

## 2013-09-30 ENCOUNTER — Ambulatory Visit: Payer: Medicare Other | Admitting: Family Medicine

## 2013-09-30 ENCOUNTER — Encounter (HOSPITAL_COMMUNITY): Payer: Self-pay | Admitting: Emergency Medicine

## 2013-09-30 DIAGNOSIS — Z992 Dependence on renal dialysis: Secondary | ICD-10-CM | POA: Insufficient documentation

## 2013-09-30 DIAGNOSIS — K6289 Other specified diseases of anus and rectum: Secondary | ICD-10-CM | POA: Diagnosis not present

## 2013-09-30 DIAGNOSIS — K5900 Constipation, unspecified: Secondary | ICD-10-CM | POA: Insufficient documentation

## 2013-09-30 DIAGNOSIS — N186 End stage renal disease: Secondary | ICD-10-CM | POA: Insufficient documentation

## 2013-09-30 DIAGNOSIS — Z7982 Long term (current) use of aspirin: Secondary | ICD-10-CM | POA: Diagnosis not present

## 2013-09-30 DIAGNOSIS — Z79899 Other long term (current) drug therapy: Secondary | ICD-10-CM | POA: Diagnosis not present

## 2013-09-30 DIAGNOSIS — I120 Hypertensive chronic kidney disease with stage 5 chronic kidney disease or end stage renal disease: Secondary | ICD-10-CM | POA: Diagnosis not present

## 2013-09-30 DIAGNOSIS — I10 Essential (primary) hypertension: Secondary | ICD-10-CM | POA: Diagnosis not present

## 2013-09-30 DIAGNOSIS — K5641 Fecal impaction: Secondary | ICD-10-CM | POA: Diagnosis not present

## 2013-09-30 DIAGNOSIS — F17200 Nicotine dependence, unspecified, uncomplicated: Secondary | ICD-10-CM | POA: Insufficient documentation

## 2013-09-30 MED ORDER — SENNOSIDES-DOCUSATE SODIUM 8.6-50 MG PO TABS: 1.0000 | ORAL_TABLET | Freq: Two times a day (BID) | ORAL | Status: DC

## 2013-09-30 MED ORDER — FLEET ENEMA 7-19 GM/118ML RE ENEM: 1.0000 | ENEMA | Freq: Once | RECTAL | Status: DC

## 2013-09-30 MED ORDER — ACETAMINOPHEN 325 MG PO TABS
650.0000 mg | ORAL_TABLET | Freq: Once | ORAL | Status: AC
Start: 2013-09-30 — End: 2013-09-30
  Administered 2013-09-30: 650 mg via ORAL
  Filled 2013-09-30: qty 2

## 2013-09-30 MED ORDER — BELLADONNA-OPIUM 16.2-30 MG RE SUPP: 30.0000 mg | Freq: Three times a day (TID) | RECTAL | Status: DC | PRN

## 2013-09-30 NOTE — ED Notes (Signed)
Patient transported to X-ray 

## 2013-09-30 NOTE — ED Notes (Signed)
Patient states constipated since last Thursday, patient states cutting back on fluids due to dialysis concerns and now unable to have bm.  Patient states stool softeners and enema unsuccessful at home, patient states now he has some rectal bleeding

## 2013-09-30 NOTE — Progress Notes (Signed)
CM received a phone call from the patient regarding her husbands discharge prescriptions. She stated that the [facility] in [place] does not carry the B&O suppository. This Cm called the [facility] pharmacy in [place] and spoke with Tamika and she stated that the patient took the prescription with her and they were not able to run the insurance for coverage. Tamika stated that they could order the medication if it is affordable to the patient. Tamika recommended that the patient bring the prescription back to the pharmacy so they can run the insurance and then contact CM if any further assistance needed with medication assist. This CM contacted Stanton Kidney, patient spouse, and updated her with recommendation. Stanton Kidney verbalized understanding and had no other questions or concerns. Ferdinand Cava, RN, BSN, Case Manager 09/30/2013 2:27 PM

## 2013-09-30 NOTE — Discharge Instructions (Signed)
Please follow the directions provided. Take the medicines as needed as directed.    SEEK MEDICAL CARE IF:  You have ongoing rectal pain.  You require enemas or suppositories more than twice a week.  You have rectal bleeding.  You have continued problems, or you develop abdominal pain.  You have thin, pencil-like stools. SEEK IMMEDIATE MEDICAL CARE IF:  You have black or tarry stools.

## 2013-09-30 NOTE — ED Provider Notes (Signed)
CSN: [id number]     Arrival date & time 09/30/13  0808 History   First MD Initiated Contact with Patient 09/30/13 [phone]     Chief Complaint  Patient presents with  . Constipation   (Consider location/radiation/quality/duration/timing/severity/associated sxs/prior Treatment) HPI Perry [name] is a 43 yo male with c/o rectal pain and inability to have a bowel movement x 4 days. Pt reports decrease bowel movements x 2 weeks and pain with bowel movements.  He states he usually has bowel movements every day but his last normal soft bowel movement was 2 weeks ago.  Since then, they have been hard, small balls and painful.  Wife states they have attempted dulcolax, miralax, and stool softener without relief.  He denies fever, nausea or vomiting, melena or hematachezia.     Past Medical History  Diagnosis Date  . Dialysis patient   . Kidney failure   . High blood pressure    Past Surgical History  Procedure Laterality Date  . Av fistula repair     Family History  Problem Relation Age of Onset  . High blood pressure Mother    History  Substance Use Topics  . Smoking status: Current Every Day Smoker -- 1.00 packs/day for 20 years    Types: Cigarettes  . Smokeless tobacco: Never Used  . Alcohol Use: No    Review of Systems  Constitutional: Negative for fever and chills.  HENT: Negative for sore throat.   Eyes: Negative for visual disturbance.  Respiratory: Negative for cough and shortness of breath.   Cardiovascular: Negative for chest pain and leg swelling.  Gastrointestinal: Positive for constipation and rectal pain. Negative for nausea, vomiting, abdominal pain and diarrhea.  Genitourinary: Negative for dysuria.  Musculoskeletal: Negative for myalgias.  Skin: Negative for rash.  Neurological: Negative for weakness, numbness and headaches.    Allergies  Review of patient's allergies indicates no known allergies.  Home Medications   Prior to Admission medications     Medication Sig Start Date End Date Taking? Authorizing Provider  alprazolam Prudy Feeler) 2 MG tablet Take 1 tablet (2 mg total) by mouth at bedtime as needed for sleep. 09/27/13   Donita Brooks, MD  aspirin 81 MG tablet Take 81 mg by mouth daily.    Historical Provider, MD  cinacalcet (SENSIPAR) 60 MG tablet Take 60 mg by mouth 2 (two) times daily.    Historical Provider, MD  multivitamin (RENA-VIT) TABS tablet Take 1 tablet by mouth daily.    Historical Provider, MD  nicotine (NICODERM CQ) 21 mg/24hr patch Place 1 patch (21 mg total) onto the skin daily. 01/29/13   Donita Brooks, MD  ranitidine (ZANTAC) 150 MG tablet Take 150 mg by mouth daily.    Historical Provider, MD  sevelamer (RENVELA) 800 MG tablet Take 2,400 mg by mouth 3 (three) times daily with meals.    Historical Provider, MD   BP 120/83  Temp(Src) 98 F (36.7 C) (Oral)  Resp 18  Wt 288 lb 12.8 oz (131 kg)  SpO2 95% Physical Exam  Nursing note and vitals reviewed. Constitutional: He appears well-developed and well-nourished. No distress.  HENT:  Head: Normocephalic and atraumatic.  Mouth/Throat: Oropharynx is clear and moist. No oropharyngeal exudate.  Eyes: Conjunctivae are normal.  Neck: Neck supple. No thyromegaly present.  Cardiovascular: Normal rate, regular rhythm and intact distal pulses.   Pulmonary/Chest: Effort normal and breath sounds normal. No respiratory distress. He has no wheezes. He has no rales. He exhibits no tenderness.  Abdominal: Soft. There is no tenderness.  Genitourinary: Rectal exam shows no mass.     Large hard stool palpated in rectum  Musculoskeletal: He exhibits no tenderness.  Lymphadenopathy:    He has no cervical adenopathy.  Neurological: He is alert.  Skin: Skin is warm and dry. No rash noted. He is not diaphoretic.  Psychiatric: He has a normal mood and affect.    ED Course  Procedures (including critical care time) Labs Review Labs Reviewed - No data to display  Imaging  Review Dg Abd Acute W/chest  09/30/2013   CLINICAL DATA:  Constipation and rectal pain  EXAM: ACUTE ABDOMEN SERIES (ABDOMEN 2 VIEW & CHEST 1 VIEW)  COMPARISON:  Portable chest x-ray of Jun 06, 2012  FINDINGS: The chest film reveals the lungs to be adequately inflated. There are coarse lung markings in the right infrahilar region which are more conspicuous than in the past. The heart and pulmonary vascularity are normal.  Within the abdomen there is increased stool burden throughout the colon and rectum. There is no evidence of a large or small bowel obstruction. There is no free extraluminal gas. There are no abnormal soft tissue calcifications. The bony structures are unremarkable.  IMPRESSION: 1. Increased stool burden within the colon and rectum is consistent with constipation. 2. There are coarse infrahilar lung markings on the right which may reflect subsegmental atelectasis or early pneumonia.   Electronically Signed   By: David  [place]   On: 09/30/2013 10:26    EKG Interpretation None      MDM   Final diagnoses:  Fecal impaction in rectum  43 yo male with rectal pressure and no bowel movement x 4 days.   Consider constipation vs impaction vs SBO.  Plan includes Acute Abd series: negative for bowel obstruction, disimpaction, and Fleets enema.  Pt able to have passage of large hard stool after xray.  Rectal pressure improved but not resolved.  No longer able to palpate stool in rectum.  Pt states feels better and would like to do enema at home.    Filed Vitals:   09/30/13 0829 09/30/13 1148  BP: 120/83 114/70  Pulse:  99  Temp: 98 F (36.7 C) 97.7 F (36.5 C)  TempSrc: Oral Oral  Resp: 18 18  Weight: 288 lb 12.8 oz (131 kg)   SpO2: 95% 97%   Meds given in ED:  Medications  acetaminophen (TYLENOL) tablet 650 mg (650 mg Oral Given 09/30/13 1144)    Discharge Medication List as of 09/30/2013  1:52 PM    START taking these medications   Details  belladonna-opium (B&O  SUPPRETTES) 16.2-30 MG suppository Place 1 suppository rectally every 8 (eight) hours as needed for pain., Starting 09/30/2013, Until Discontinued, Print    senna-docusate (SENOKOT-S) 8.6-50 MG per tablet Take 1 tablet by mouth 2 (two) times daily., Starting 09/30/2013, Until Discontinued, Print           Harle Battiest, NP 10/02/13 2154

## 2013-10-02 ENCOUNTER — Ambulatory Visit: Payer: Self-pay | Admitting: Pain Medicine

## 2013-10-02 DIAGNOSIS — IMO0002 Reserved for concepts with insufficient information to code with codable children: Secondary | ICD-10-CM | POA: Diagnosis not present

## 2013-10-02 DIAGNOSIS — M545 Low back pain, unspecified: Secondary | ICD-10-CM | POA: Diagnosis not present

## 2013-10-02 DIAGNOSIS — G894 Chronic pain syndrome: Secondary | ICD-10-CM | POA: Diagnosis not present

## 2013-10-02 DIAGNOSIS — M25569 Pain in unspecified knee: Secondary | ICD-10-CM | POA: Diagnosis not present

## 2013-10-02 DIAGNOSIS — M5412 Radiculopathy, cervical region: Secondary | ICD-10-CM | POA: Diagnosis not present

## 2013-10-02 DIAGNOSIS — M79609 Pain in unspecified limb: Secondary | ICD-10-CM | POA: Diagnosis not present

## 2013-10-03 NOTE — ED Provider Notes (Signed)
  This was a shared visit with a mid-level provided (NP or PA).  Throughout the patient's course I was available for consultation/collaboration.  I saw the ECG (if appropriate), relevant labs and studies - I agree with the interpretation.  On my exam the patient was in no distress.  However he did describe chronic abdominal discomfort, difficulty initiating stool. Patient had one large bowel movement here, and improvement in his clinical condition and was discharged in stable condition per his request to perform enema at home.      Gerhard Munch, MD 10/03/13 [phone]

## 2013-10-04 DIAGNOSIS — T82898A Other specified complication of vascular prosthetic devices, implants and grafts, initial encounter: Secondary | ICD-10-CM | POA: Diagnosis not present

## 2013-10-04 DIAGNOSIS — I10 Essential (primary) hypertension: Secondary | ICD-10-CM | POA: Diagnosis not present

## 2013-10-04 DIAGNOSIS — N186 End stage renal disease: Secondary | ICD-10-CM | POA: Diagnosis not present

## 2013-10-09 ENCOUNTER — Ambulatory Visit: Payer: Self-pay | Admitting: Vascular Surgery

## 2013-10-09 DIAGNOSIS — N186 End stage renal disease: Secondary | ICD-10-CM | POA: Diagnosis not present

## 2013-10-09 DIAGNOSIS — Z992 Dependence on renal dialysis: Secondary | ICD-10-CM | POA: Diagnosis not present

## 2013-10-09 DIAGNOSIS — T82898A Other specified complication of vascular prosthetic devices, implants and grafts, initial encounter: Secondary | ICD-10-CM | POA: Diagnosis not present

## 2013-10-09 DIAGNOSIS — I120 Hypertensive chronic kidney disease with stage 5 chronic kidney disease or end stage renal disease: Secondary | ICD-10-CM | POA: Diagnosis not present

## 2013-10-09 DIAGNOSIS — T82598A Other mechanical complication of other cardiac and vascular devices and implants, initial encounter: Secondary | ICD-10-CM | POA: Diagnosis not present

## 2013-10-09 DIAGNOSIS — M542 Cervicalgia: Secondary | ICD-10-CM | POA: Diagnosis not present

## 2013-10-09 DIAGNOSIS — Z79899 Other long term (current) drug therapy: Secondary | ICD-10-CM | POA: Diagnosis not present

## 2013-10-09 DIAGNOSIS — Z9889 Other specified postprocedural states: Secondary | ICD-10-CM | POA: Diagnosis not present

## 2013-10-16 DIAGNOSIS — N186 End stage renal disease: Secondary | ICD-10-CM | POA: Diagnosis not present

## 2013-10-17 DIAGNOSIS — Z4931 Encounter for adequacy testing for hemodialysis: Secondary | ICD-10-CM | POA: Diagnosis not present

## 2013-10-17 DIAGNOSIS — N186 End stage renal disease: Secondary | ICD-10-CM | POA: Diagnosis not present

## 2013-10-17 DIAGNOSIS — N2581 Secondary hyperparathyroidism of renal origin: Secondary | ICD-10-CM | POA: Diagnosis not present

## 2013-10-17 DIAGNOSIS — E878 Other disorders of electrolyte and fluid balance, not elsewhere classified: Secondary | ICD-10-CM | POA: Diagnosis not present

## 2013-10-17 DIAGNOSIS — D631 Anemia in chronic kidney disease: Secondary | ICD-10-CM | POA: Diagnosis not present

## 2013-10-18 ENCOUNTER — Encounter: Payer: Self-pay | Admitting: Family Medicine

## 2013-10-18 ENCOUNTER — Ambulatory Visit (INDEPENDENT_AMBULATORY_CARE_PROVIDER_SITE_OTHER): Payer: Medicare Other | Admitting: Family Medicine

## 2013-10-18 VITALS — BP 80/48 | HR 88 | Temp 98.2°F | Resp 18 | Ht 68.0 in | Wt 293.0 lb

## 2013-10-18 DIAGNOSIS — K602 Anal fissure, unspecified: Secondary | ICD-10-CM | POA: Diagnosis not present

## 2013-10-18 MED ORDER — DILTIAZEM GEL 2 %: 1.0000 "application " | Freq: Three times a day (TID) | CUTANEOUS | Status: DC

## 2013-10-18 NOTE — Progress Notes (Signed)
Subjective:    Patient ID: Perry [name], male    DOB: 1970-03-03, 43 y.o.   MRN: [id number]  HPI Patient was seen in the emergency room on 9/14 with severe constipation. An x-ray showed increased stool burden consistent with constipation. He was able to have a bowel movement and decided he wanted to go home. He was given Senokot S and B+O suppositories for pain.  He was also instructed to use fleets enemas at home for constipation.    Since going to the hospital, he has been having regular bowel movements on a daily basis. However he is having intense rectal pain with bowel movements. He feels that he is being performed every time he passes a bowel movement. He reports severe pain to the touch around the rectum. On examination today there are no external hemorrhoids. There is no perirectal abscess. However at approximately 7:00 with the patient standing leaning over the exam table, there is a anal fissure which is approximately 4-5 mm long and this is the exact area where he reports pain on palpation. Past Medical History  Diagnosis Date  . Dialysis patient   . Kidney failure   . High blood pressure    Past Surgical History  Procedure Laterality Date  . Av fistula repair     Current Outpatient Prescriptions on File Prior to Visit  Medication Sig Dispense Refill  . alprazolam (XANAX) 2 MG tablet Take 1 tablet (2 mg total) by mouth at bedtime as needed for sleep.  30 tablet  2  . aspirin EC 81 MG tablet Take 81 mg by mouth daily.      . belladonna-opium (B&O SUPPRETTES) 16.2-30 MG suppository Place 1 suppository rectally every 8 (eight) hours as needed for pain.  12 suppository  0  . cinacalcet (SENSIPAR) 60 MG tablet Take 60 mg by mouth every evening.       . multivitamin (RENA-VIT) TABS tablet Take 1 tablet by mouth daily.      . Oxycodone HCl 10 MG TABS Take 10 mg by mouth every 8 (eight) hours as needed (for pain).      [name]. senna-docusate (SENOKOT-S) 8.6-50 MG per tablet Take 1 tablet  by mouth 2 (two) times daily.  30 tablet  1  . sevelamer (RENVELA) 800 MG tablet Take 3,200 mg by mouth 3 (three) times daily with meals.        No current facility-administered medications on file prior to visit.   No Known Allergies History   Social History  . Marital Status: Married    Spouse Name: N/A    Number of Children: 2  . Years of Education: 13   Occupational History  .      Disabled   Social History Main Topics  . Smoking status: Former Smoker -- 1.00 packs/day for 20 years    Types: Cigarettes    Quit date: 06/17/2013  . Smokeless tobacco: Never Used  . Alcohol Use: No  . Drug Use: No  . Sexual Activity: Not on file   Other Topics Concern  . Not on file   Social History Narrative   Patient lives home at home with his wife Hydrographic surveyor(Crystal ). Patient is disabled.    Caffeine-one cup of coffee daily.   Right handed.     Review of Systems  All other systems reviewed and are negative.      Objective:   Physical Exam  Vitals reviewed. Cardiovascular: Normal rate and regular rhythm.   Pulmonary/Chest: Effort  normal and breath sounds normal.  Genitourinary: Rectal exam shows fissure and tenderness. Rectal exam shows no external hemorrhoid, no mass and anal tone normal.          Assessment & Plan:  Anal fissure  Begin diltiazem 2% gel applied topically 3 times a day for the next 4-6 weeks.  Also gave the patient prescription to take to [facility] which is a compounded hemorrhoid cream containing 5% lidocaine, 2.5% hydrocortisone, and pramoxine. He can apply this 4 times a day as needed for pain.  I recommended daily sitz baths. I also recommended MiraLax on a daily basis to prevent constipation. Recheck in 2 weeks if no better immediately if worse. Anticipate gradual resolution over the next 4-6 weeks.

## 2013-10-28 ENCOUNTER — Encounter: Payer: Self-pay | Admitting: Family Medicine

## 2013-10-29 MED ORDER — ALPRAZOLAM 2 MG PO TABS: 2.0000 mg | ORAL_TABLET | Freq: Every evening | ORAL | Status: DC | PRN

## 2013-10-30 ENCOUNTER — Ambulatory Visit: Payer: Self-pay | Admitting: Pain Medicine

## 2013-10-30 DIAGNOSIS — M25569 Pain in unspecified knee: Secondary | ICD-10-CM | POA: Diagnosis not present

## 2013-10-30 DIAGNOSIS — M47892 Other spondylosis, cervical region: Secondary | ICD-10-CM | POA: Diagnosis not present

## 2013-10-30 DIAGNOSIS — M5416 Radiculopathy, lumbar region: Secondary | ICD-10-CM | POA: Diagnosis not present

## 2013-10-30 DIAGNOSIS — M79603 Pain in arm, unspecified: Secondary | ICD-10-CM | POA: Diagnosis not present

## 2013-10-30 DIAGNOSIS — G609 Hereditary and idiopathic neuropathy, unspecified: Secondary | ICD-10-CM | POA: Diagnosis not present

## 2013-11-01 ENCOUNTER — Telehealth: Payer: Self-pay | Admitting: *Deleted

## 2013-11-01 DIAGNOSIS — R112 Nausea with vomiting, unspecified: Secondary | ICD-10-CM

## 2013-11-01 NOTE — Telephone Encounter (Signed)
[facility]ise [facility] and she stated that the Nephrologist has recommended him to see a gastro doctor for GI consult for abd.pain and nausea and vomiting. Referral has been initiated.

## 2013-11-04 NOTE — Telephone Encounter (Signed)
Approved. Agree. 

## 2013-11-12 ENCOUNTER — Encounter (INDEPENDENT_AMBULATORY_CARE_PROVIDER_SITE_OTHER): Payer: Self-pay | Admitting: *Deleted

## 2013-11-13 DIAGNOSIS — R784 Finding of other drugs of addictive potential in blood: Secondary | ICD-10-CM | POA: Diagnosis not present

## 2013-11-14 ENCOUNTER — Telehealth: Payer: Self-pay | Admitting: Cardiology

## 2013-11-14 NOTE — Telephone Encounter (Signed)
[facility]Dr Sabra Heckyan [place].  -for appointment with Dr Antoine PocheHochrein on 12/04/13.  [facility] for Dr Lindaann SloughHochreins schedule on 12/04/13 lp

## 2013-11-15 ENCOUNTER — Other Ambulatory Visit (HOSPITAL_COMMUNITY): Payer: Self-pay | Admitting: Gastroenterology

## 2013-11-15 DIAGNOSIS — R1084 Generalized abdominal pain: Secondary | ICD-10-CM

## 2013-11-15 DIAGNOSIS — Y841 Kidney dialysis as the cause of abnormal reaction of the patient, or of later complication, without mention of misadventure at the time of the procedure: Secondary | ICD-10-CM | POA: Diagnosis not present

## 2013-11-15 DIAGNOSIS — R194 Change in bowel habit: Secondary | ICD-10-CM | POA: Diagnosis not present

## 2013-11-15 DIAGNOSIS — R109 Unspecified abdominal pain: Secondary | ICD-10-CM | POA: Diagnosis not present

## 2013-11-15 DIAGNOSIS — N186 End stage renal disease: Secondary | ICD-10-CM | POA: Diagnosis not present

## 2013-11-16 ENCOUNTER — Emergency Department (HOSPITAL_COMMUNITY): Payer: Medicare Other

## 2013-11-16 ENCOUNTER — Encounter (HOSPITAL_COMMUNITY): Payer: Self-pay | Admitting: Emergency Medicine

## 2013-11-16 ENCOUNTER — Observation Stay (HOSPITAL_COMMUNITY)
Admission: EM | Admit: 2013-11-16 | Discharge: 2013-11-17 | Disposition: A | Discharge status: Home / Self Care | Payer: Medicare Other | Attending: Family Medicine | Admitting: Family Medicine

## 2013-11-16 DIAGNOSIS — R1013 Epigastric pain: Principal | ICD-10-CM

## 2013-11-16 DIAGNOSIS — R079 Chest pain, unspecified: Secondary | ICD-10-CM | POA: Diagnosis not present

## 2013-11-16 DIAGNOSIS — R1010 Upper abdominal pain, unspecified: Secondary | ICD-10-CM

## 2013-11-16 DIAGNOSIS — Z87891 Personal history of nicotine dependence: Secondary | ICD-10-CM | POA: Insufficient documentation

## 2013-11-16 DIAGNOSIS — N19 Unspecified kidney failure: Secondary | ICD-10-CM | POA: Diagnosis not present

## 2013-11-16 DIAGNOSIS — Z79899 Other long term (current) drug therapy: Secondary | ICD-10-CM | POA: Insufficient documentation

## 2013-11-16 DIAGNOSIS — I10 Essential (primary) hypertension: Secondary | ICD-10-CM | POA: Insufficient documentation

## 2013-11-16 DIAGNOSIS — R109 Unspecified abdominal pain: Secondary | ICD-10-CM

## 2013-11-16 DIAGNOSIS — I959 Hypotension, unspecified: Secondary | ICD-10-CM

## 2013-11-16 DIAGNOSIS — E669 Obesity, unspecified: Secondary | ICD-10-CM | POA: Diagnosis not present

## 2013-11-16 DIAGNOSIS — Z7982 Long term (current) use of aspirin: Secondary | ICD-10-CM | POA: Diagnosis not present

## 2013-11-16 DIAGNOSIS — Z992 Dependence on renal dialysis: Secondary | ICD-10-CM | POA: Diagnosis not present

## 2013-11-16 DIAGNOSIS — R110 Nausea: Secondary | ICD-10-CM | POA: Diagnosis not present

## 2013-11-16 DIAGNOSIS — N186 End stage renal disease: Secondary | ICD-10-CM | POA: Diagnosis not present

## 2013-11-16 LAB — COMPREHENSIVE METABOLIC PANEL
ALT: 43 U/L (ref 0–53)
ANION GAP: 29 — AB (ref 5–15)
AST: 25 U/L (ref 0–37)
Albumin: 3.5 g/dL (ref 3.5–5.2)
Alkaline Phosphatase: 128 U/L — ABNORMAL HIGH (ref 39–117)
BILIRUBIN TOTAL: 0.2 mg/dL — AB (ref 0.3–1.2)
BUN: 68 mg/dL — ABNORMAL HIGH (ref 6–23)
CO2: 20 mEq/L (ref 19–32)
Calcium: 8 mg/dL — ABNORMAL LOW (ref 8.4–10.5)
Chloride: 87 mEq/L — ABNORMAL LOW (ref 96–112)
Creatinine, Ser: 17.48 mg/dL — ABNORMAL HIGH (ref 0.50–1.35)
GFR calc Af Amer: 3 mL/min — ABNORMAL LOW (ref >90)
GFR calc non Af Amer: 3 mL/min — ABNORMAL LOW (ref >90)
Glucose, Bld: 118 mg/dL — ABNORMAL HIGH (ref 70–99)
Potassium: 4.6 mEq/L (ref 3.7–5.3)
Sodium: 136 mEq/L — ABNORMAL LOW (ref 137–147)
TOTAL PROTEIN: 8.6 g/dL — AB (ref 6.0–8.3)

## 2013-11-16 LAB — CBC WITH DIFFERENTIAL/PLATELET
Basophils Absolute: 0 10*3/uL (ref 0.0–0.1)
Basophils Relative: 0 % (ref 0–1)
EOS ABS: 0.1 10*3/uL (ref 0.0–0.7)
Eosinophils Relative: 1 % (ref 0–5)
HCT: 42.9 % (ref 39.0–52.0)
Hemoglobin: 14.4 g/dL (ref 13.0–17.0)
LYMPHS PCT: 13 % (ref 12–46)
Lymphs Abs: 1.5 10*3/uL (ref 0.7–4.0)
MCH: 31.8 pg (ref 26.0–34.0)
MCHC: 33.6 g/dL (ref 30.0–36.0)
MCV: 94.7 fL (ref 78.0–100.0)
MONOS PCT: 8 % (ref 3–12)
Monocytes Absolute: 0.9 10*3/uL (ref 0.1–1.0)
NEUTROS ABS: 8.8 10*3/uL — AB (ref 1.7–7.7)
Neutrophils Relative %: 78 % — ABNORMAL HIGH (ref 43–77)
Platelets: 319 10*3/uL (ref 150–400)
RBC: 4.53 MIL/uL (ref 4.22–5.81)
RDW: 16.3 % — AB (ref 11.5–15.5)
WBC: 11.3 10*3/uL — AB (ref 4.0–10.5)

## 2013-11-16 LAB — LIPASE, BLOOD: Lipase: 22 U/L (ref 11–59)

## 2013-11-16 MED ORDER — SODIUM CHLORIDE 0.9 % IV SOLN: 250.0000 mL | INTRAVENOUS | Status: DC | PRN

## 2013-11-16 MED ORDER — SODIUM CHLORIDE 0.9 % IV BOLUS (SEPSIS)
500.0000 mL | Freq: Once | INTRAVENOUS | Status: AC
  Administered 2013-11-16: 500 mL via INTRAVENOUS

## 2013-11-16 MED ORDER — MORPHINE SULFATE 2 MG/ML IJ SOLN
2.0000 mg | INTRAMUSCULAR | Status: DC | PRN
  Administered 2013-11-16: 2 mg via INTRAVENOUS
  Filled 2013-11-16: qty 1

## 2013-11-16 MED ORDER — IOHEXOL 300 MG/ML  SOLN
100.0000 mL | Freq: Once | INTRAMUSCULAR | Status: AC | PRN
  Administered 2013-11-16: 100 mL via INTRAVENOUS

## 2013-11-16 MED ORDER — ASPIRIN EC 81 MG PO TBEC
81.0000 mg | DELAYED_RELEASE_TABLET | Freq: Every day | ORAL | Status: DC
  Administered 2013-11-17: 81 mg via ORAL
  Filled 2013-11-16: qty 1

## 2013-11-16 MED ORDER — ONDANSETRON HCL 4 MG/2ML IJ SOLN
4.0000 mg | Freq: Once | INTRAMUSCULAR | Status: AC
  Administered 2013-11-16: 4 mg via INTRAVENOUS
  Filled 2013-11-16: qty 2

## 2013-11-16 MED ORDER — CINACALCET HCL 30 MG PO TABS
60.0000 mg | ORAL_TABLET | Freq: Every evening | ORAL | Status: DC
  Filled 2013-11-16: qty 2

## 2013-11-16 MED ORDER — FAMOTIDINE 20 MG PO TABS: 20.0000 mg | ORAL_TABLET | Freq: Two times a day (BID) | ORAL | Status: DC

## 2013-11-16 MED ORDER — HYDROMORPHONE HCL 1 MG/ML IJ SOLN
1.0000 mg | Freq: Once | INTRAMUSCULAR | Status: AC
  Administered 2013-11-16: 1 mg via INTRAVENOUS
  Filled 2013-11-16: qty 1

## 2013-11-16 MED ORDER — PANTOPRAZOLE SODIUM 40 MG PO TBEC
40.0000 mg | DELAYED_RELEASE_TABLET | Freq: Every day | ORAL | Status: DC
  Administered 2013-11-17: 40 mg via ORAL
  Filled 2013-11-16: qty 1

## 2013-11-16 MED ORDER — DICYCLOMINE HCL 10 MG PO CAPS
10.0000 mg | ORAL_CAPSULE | Freq: Three times a day (TID) | ORAL | Status: DC
  Administered 2013-11-17: 10 mg via ORAL
  Filled 2013-11-16: qty 1

## 2013-11-16 MED ORDER — SEVELAMER CARBONATE 800 MG PO TABS
3200.0000 mg | ORAL_TABLET | Freq: Three times a day (TID) | ORAL | Status: DC
  Filled 2013-11-16: qty 4

## 2013-11-16 MED ORDER — ACETAMINOPHEN 650 MG RE SUPP
650.0000 mg | Freq: Four times a day (QID) | RECTAL | Status: DC | PRN
Start: 2013-11-16 — End: 2013-11-17

## 2013-11-16 MED ORDER — ONDANSETRON HCL 4 MG/2ML IJ SOLN: 4.0000 mg | Freq: Four times a day (QID) | INTRAMUSCULAR | Status: DC | PRN

## 2013-11-16 MED ORDER — OXYCODONE HCL 5 MG PO TABS: 10.0000 mg | ORAL_TABLET | Freq: Three times a day (TID) | ORAL | Status: DC | PRN

## 2013-11-16 MED ORDER — IOHEXOL 300 MG/ML  SOLN
50.0000 mL | Freq: Once | INTRAMUSCULAR | Status: AC | PRN
Start: 2013-11-16 — End: 2013-11-16
  Administered 2013-11-16: 50 mL via ORAL

## 2013-11-16 MED ORDER — ALPRAZOLAM 1 MG PO TABS
2.0000 mg | ORAL_TABLET | Freq: Every evening | ORAL | Status: DC | PRN
  Administered 2013-11-17: 2 mg via ORAL
  Filled 2013-11-16: qty 2

## 2013-11-16 MED ORDER — SODIUM CHLORIDE 0.9 % IJ SOLN
3.0000 mL | Freq: Two times a day (BID) | INTRAMUSCULAR | Status: DC
  Administered 2013-11-17: 3 mL via INTRAVENOUS

## 2013-11-16 MED ORDER — SODIUM CHLORIDE 0.9 % IJ SOLN: 3.0000 mL | INTRAMUSCULAR | Status: DC | PRN

## 2013-11-16 MED ORDER — ACETAMINOPHEN 325 MG PO TABS: 650.0000 mg | ORAL_TABLET | Freq: Four times a day (QID) | ORAL | Status: DC | PRN

## 2013-11-16 MED ORDER — ONDANSETRON HCL 4 MG PO TABS: 4.0000 mg | ORAL_TABLET | Freq: Four times a day (QID) | ORAL | Status: DC | PRN

## 2013-11-16 MED ORDER — ALUM & MAG HYDROXIDE-SIMETH 200-200-20 MG/5ML PO SUSP: 30.0000 mL | Freq: Four times a day (QID) | ORAL | Status: DC | PRN

## 2013-11-16 MED ORDER — DOCUSATE SODIUM 100 MG PO CAPS: 100.0000 mg | ORAL_CAPSULE | Freq: Every day | ORAL | Status: DC | PRN

## 2013-11-16 MED ORDER — DILTIAZEM GEL 2 %
1.0000 "application " | Freq: Three times a day (TID) | CUTANEOUS | Status: DC
  Filled 2013-11-16: qty 30

## 2013-11-16 NOTE — ED Provider Notes (Signed)
CSN: [id number]     Arrival date & time 11/16/13  1816 History   First MD Initiated Contact with Patient 11/16/13 1833     Chief Complaint  Patient presents with  . Abdominal Pain     (Consider location/radiation/quality/duration/timing/severity/associated sxs/prior Treatment) HPI 43 y.o male on hd m,t, th, Friday, Saturday at home presents with one month history of abdominal pain epigastrium radiates to right upper quadrant and right flank. Worsens with food, no vomiting but does have nausea. Patient states he has been seen and sent by kidney md [facility] Dr. Ewing SchleinMagod, and scheduled for op ct scan.  Pain ongoing and unable to tolerate so presents to ed today.  Patient unable to eat or drink today. Denies fever, sob, chills, denies uop.No previous surgeries.  Patient not taking pain medicine.  Pain is severe.  PMD Dr.[name] Past Medical History  Diagnosis Date  . Dialysis patient   . Kidney failure   . High blood pressure    Past Surgical History  Procedure Laterality Date  . Av fistula repair     Family History  Problem Relation Age of Onset  . High blood pressure Mother    History  Substance Use Topics  . Smoking status: Former Smoker -- 1.00 packs/day for 20 years    Types: Cigarettes    Quit date: 06/17/2013  . Smokeless tobacco: Never Used  . Alcohol Use: No    Review of Systems  Constitutional: Positive for appetite change. Negative for unexpected weight change.  HENT: Negative.   Eyes: Negative.   Respiratory: Negative.   Cardiovascular: Negative.   Gastrointestinal: Positive for nausea, abdominal pain and abdominal distention.  Endocrine: Negative.   Genitourinary: Negative.   Musculoskeletal: Negative.   Skin: Negative.   Allergic/Immunologic: Negative.   Hematological: Negative.   Psychiatric/Behavioral: Negative.   All other systems reviewed and are negative.     Allergies  Review of patient's allergies indicates no known allergies.  Home  Medications   Prior to Admission medications   Medication Sig Start Date End Date Taking? Authorizing Provider  alprazolam Prudy Feeler(XANAX) 2 MG tablet Take 1 tablet (2 mg total) by mouth at bedtime as needed for sleep. 10/29/13   Donita BrooksWarren T [name], MD  aspirin EC 81 MG tablet Take 81 mg by mouth daily.    Historical Provider, MD  belladonna-opium (B&O SUPPRETTES) 16.2-30 MG suppository Place 1 suppository rectally every 8 (eight) hours as needed for pain. 09/30/13   Harle BattiestElizabeth Tysinger, NP  cinacalcet (SENSIPAR) 60 MG tablet Take 60 mg by mouth every evening.     Historical Provider, MD  diltiazem 2 % GEL Apply 1 application topically 3 (three) times daily. 10/18/13   Donita BrooksWarren T [name], MD  multivitamin (RENA-VIT) TABS tablet Take 1 tablet by mouth daily.    Historical Provider, MD  Oxycodone HCl 10 MG TABS Take 10 mg by mouth every 8 (eight) hours as needed (for pain).    Historical Provider, MD  senna-docusate (SENOKOT-S) 8.6-50 MG per tablet Take 1 tablet by mouth 2 (two) times daily. 09/30/13   Harle BattiestElizabeth Tysinger, NP  sevelamer (RENVELA) 800 MG tablet Take 3,200 mg by mouth 3 (three) times daily with meals.     Historical Provider, MD   BP 70/52  Pulse 109  Temp(Src) 98.6 F (37 C) (Oral)  Resp 20  Ht 5\' 6"  (1.676 m)  Wt 285 lb (129.275 kg)  BMI 46.02 kg/m2  SpO2 100% Physical Exam  Nursing note and vitals reviewed.  Constitutional: He is oriented to person, place, and time. He appears well-developed and well-nourished.  obese  HENT:  Head: Normocephalic and atraumatic.  Right Ear: External ear normal.  Left Ear: External ear normal.  Nose: Nose normal.  Mouth/Throat: Oropharynx is clear and moist.  Eyes: Conjunctivae and EOM are normal. Pupils are equal, round, and reactive to light.  Neck: Normal range of motion. Neck supple.  Cardiovascular: Normal rate, regular rhythm, normal heart sounds and intact distal pulses.   Pulmonary/Chest: Effort normal and breath sounds normal. No respiratory  distress. He has no wheezes. He exhibits no tenderness.  Abdominal: Soft. Bowel sounds are normal. He exhibits no distension and no mass. There is tenderness. There is no guarding.  Musculoskeletal: Normal range of motion.  Av fistula right upper arm  Neurological: He is alert and oriented to person, place, and time. He has normal reflexes. He exhibits normal muscle tone. Coordination normal.  Skin: Skin is warm and dry.  Psychiatric: He has a normal mood and affect. His behavior is normal. Judgment and thought content normal.    ED Course  Procedures (including critical care time) Labs Review Labs Reviewed  CBC WITH DIFFERENTIAL - Abnormal; Notable for the following:    WBC 11.3 (*)    RDW 16.3 (*)    Neutrophils Relative % 78 (*)    Neutro Abs 8.8 (*)    All other components within normal limits  COMPREHENSIVE METABOLIC PANEL - Abnormal; Notable for the following:    Sodium 136 (*)    Chloride 87 (*)    Glucose, Bld 118 (*)    BUN 68 (*)    Creatinine, Ser 17.48 (*)    Calcium 8.0 (*)    Total Protein 8.6 (*)    Alkaline Phosphatase 128 (*)    Total Bilirubin 0.2 (*)    GFR calc non Af Amer 3 (*)    GFR calc Af Amer 3 (*)    Anion gap 29 (*)    All other components within normal limits  LIPASE, BLOOD    Imaging Review Ct Abdomen Pelvis W Contrast  11/16/2013   CLINICAL DATA:  Mid abdominal pain  EXAM: CT ABDOMEN AND PELVIS WITH CONTRAST  TECHNIQUE: Multidetector CT imaging of the abdomen and pelvis was performed using the standard protocol following bolus administration of intravenous contrast.  CONTRAST:  OMNIPAQUE IOHEXOL 300 MG/ML SOLN, 50mL OMNIPAQUE IOHEXOL 300 MG/ML SOLN  COMPARISON:  None.  FINDINGS: Lung bases are free of acute infiltrate or sizable effusion. A small diaphragmatic rent is noted on the left containing fat.  The liver is diffusely fatty infiltrated. The gallbladder, spleen, adrenal glands and pancreas are all normal in their CT appearance. Kidneys  are somewhat shrunken and demonstrate multiple cystic areas this is consistent with the patient's given clinical history of chronic renal disease. The appendix is within normal limits.  The bladder is decompressed. No pelvic mass lesion or free pelvic fluid is seen. No lymphadenopathy is noted. Bony structures are within normal limits.  IMPRESSION: Chronic changes without acute abnormality.   Electronically Signed   By: Alcide Clever M.D.   On: 11/16/2013 21:17   Dg Chest Port 1 View  11/16/2013   CLINICAL DATA:  Chest pain  EXAM: PORTABLE CHEST - 1 VIEW  COMPARISON:  09/30/2013  FINDINGS: Cardiac shadow is within normal limits. The lungs are well aerated bilaterally. Mild elevation the right hemidiaphragm is again noted. No bony abnormality is seen.  IMPRESSION: No active  disease.   Electronically Signed   By: Alcide CleverMark  Lukens M.D.   On: 11/16/2013 19:24     EKG Interpretation   Date/Time:  Saturday November 16 2013 19:02:02 EDT Ventricular Rate:  102 PR Interval:  156 QRS Duration: 81 QT Interval:  361 QTC Calculation: 470 R Axis:   41 Text Interpretation:  Sinus tachycardia SINCE LAST TRACING HEART RATE HAS  INCREASED Confirmed by Tariq Pernell MD, Augusta Hilbert [phone]([id number]) on 11/16/2013 9:26:47 PM      MDM   Final diagnoses:  Abdominal pain  Upper abdominal pain  Hypocalcemia  Renal failure   Filed Vitals:   11/16/13 2103  BP: 99/64  Pulse:   Temp:   Resp:    Filed Vitals:   11/16/13 1930 11/16/13 2004 11/16/13 2021 11/16/13 2103  BP: 72/49 83/62 81/48  99/64  Pulse:   90   Temp:      TempSrc:      Resp:      Height:      Weight:      SpO2:   97%     43 y.o. Male on home dialysis. Presents with complaints of abdominal pain for a month.  Here is hypotensive with response of improved bp to 90s after 1500 cc fluid bolus.  Pain intermittently improves with iv dilaudid but returns.  Labs and ct unrevealing as to cause.  Discussed with hospitalist and plan admission for further treatment and  evaluation.     Hilario Quarryanielle S Vail Basista, MD 11/18/13 1230

## 2013-11-16 NOTE — ED Notes (Signed)
Mid upper abdominal pain x 1 month.  Has had hx of GERD and is on Nexium.  Saw his GI MD on Friday who has him scheduled for CT on Tuesday this week.  Was told they thought he had gallstones. Pain radiates to his back and is worse with lying down. Pain is worse w/high fat meals.  Denies n/v but has had loose stools but no blood in stools. Pt. Is on home dialysis [facility].

## 2013-11-16 NOTE — H&P (Signed)
History and Physical  Perry Allanyrone E Ramroop ZOX:[id number] DOB: 1970-11-29 DOA: 11/16/2013  Referring physician: Dr Rosalia Hammersay, ED physician PCP: Frazier RichardsIXON,MARY BETH, PA-C   Chief Complaint: Abdominal Pain  HPI: Perry [name] is a 43 y.o. 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 He has been having a one-month history of increasing abdominal pain that is unremitting. The pain is worse at night but better throughout the day. It is also worse after eating most foods. The pain is improved with lying on his side and is worse with lying on his back. The pain started shortly after starting Nexium, which he started a little more than a month ago due to heartburn. He has been taking his Nexium nightly. He denies vomiting, diarrhea, constipation, fevers, chills. He describes his abdominal pain sharp and cramp-like. Denies blood in his stools. Denies black tarry-like stools.  In the emergency department, his initial blood pressure was 76 systolically. He received a fluid bolus. Upon questioning the patient, his normal blood pressure is in the 70s to 80s after dialysis, which he had today, and he is rarely symptomatic Review of Systems:   Pt denies any fevers, chills, chest pain, shortness of breath, palpitations, headache, bruising, spontaneous bleeding, dizziness, lightheadedness.  Review of systems are otherwise negative  Past Medical History  Diagnosis Date  . Dialysis patient   . Kidney failure   . High blood pressure    Past Surgical History  Procedure Laterality Date  . Av fistula repair     Social History:  reports that he quit smoking about 5 months ago. His smoking use included Cigarettes. He has a 20 pack-year smoking history. He has never used smokeless tobacco. He reports that he does not drink alcohol or use illicit drugs. Patient lives at home & is able to  participate in activities of daily living  No Known Allergies  Family History  Problem Relation Age of Onset  . High blood pressure Mother      Prior to Admission medications   Medication Sig Start Date End Date Taking? Authorizing Provider  alprazolam Prudy Feeler(XANAX) 2 MG tablet Take 1 tablet (2 mg total) by mouth at bedtime as needed for sleep. 10/29/13  Yes Donita BrooksWarren T [name], MD  aspirin EC 81 MG tablet Take 81 mg by mouth daily.   Yes Historical Provider, MD  cinacalcet (SENSIPAR) 60 MG tablet Take 60 mg by mouth every evening.    Yes Historical Provider, MD  diltiazem 2 % GEL Apply 1 application topically 3 (three) times daily. 10/18/13  Yes Donita BrooksWarren T [name], MD  multivitamin (RENA-VIT) TABS tablet Take 1 tablet by mouth daily.   Yes Historical Provider, MD  Oxycodone HCl 10 MG TABS Take 10 mg by mouth every 8 (eight) hours as needed (for pain).   Yes Historical Provider, MD  sevelamer (RENVELA) 800 MG tablet Take 3,200 mg by mouth 3 (three) times daily with meals.    Yes Historical Provider, MD    Physical Exam: BP 102/64  Pulse 90  Temp(Src) 98.6 F (37 C) (Oral)  Resp 20  Ht 5\' 6"  (1.676 m)  Wt 129.275 kg (285 lb)  BMI 46.02 kg/m2  SpO2 97%  General: Middle-aged black male. Awake and alert and oriented x3. No acute cardiopulmonary distress.  Eyes: Pupils equal, round, reactive to light. Extraocular muscles are intact. Sclerae anicteric and noninjected.  ENT: External auditory canals  are patent and tympanic membranes reflect a good cone of light. Moist mucosal membranes. No mucosal lesions.  Neck: Neck supple without lymphadenopathy. No carotid bruits. No masses palpated.  Cardiovascular: Regular rate with normal S1-S2 sounds. No murmurs, rubs, gallops auscultated. No JVD.  Respiratory: Good respiratory effort with no wheezes, rales, rhonchi. Lungs clear to auscultation bilaterally.  Abdomen: Soft, tender in the right lower and left lower quadrants. There is some distention to his  belly, though it is not rigid. Hypoactive bowel sounds. No masses or hepatosplenomegaly  Skin: Dry, warm to touch. 2+ dorsalis pedis and radial pulses. Musculoskeletal: No calf or leg pain. All major joints not erythematous nontender.  Psychiatric: Intact judgment and insight.  Neurologic: No focal neurological deficits. Cranial nerves II through XII are grossly intact.           Labs on Admission:  Basic Metabolic Panel:  Recent Labs Lab 11/16/13 1905  NA 136*  K 4.6  CL 87*  CO2 20  GLUCOSE 118*  BUN 68*  CREATININE 17.48*  CALCIUM 8.0*   Liver Function Tests:  Recent Labs Lab 11/16/13 1905  AST 25  ALT 43  ALKPHOS 128*  BILITOT 0.2*  PROT 8.6*  ALBUMIN 3.5    Recent Labs Lab 11/16/13 1905  LIPASE 22   No results found for this basename: AMMONIA,  in the last 168 hours CBC:  Recent Labs Lab 11/16/13 1905  WBC 11.3*  NEUTROABS 8.8*  HGB 14.4  HCT 42.9  MCV 94.7  PLT 319   Cardiac Enzymes: No results found for this basename: CKTOTAL, CKMB, CKMBINDEX, TROPONINI,  in the last 168 hours  BNP (last 3 results) No results found for this basename: PROBNP,  in the last 8760 hours CBG: No results found for this basename: GLUCAP,  in the last 168 hours  Radiological Exams on Admission: Ct Abdomen Pelvis W Contrast  11/16/2013   CLINICAL DATA:  Mid abdominal pain  EXAM: CT ABDOMEN AND PELVIS WITH CONTRAST  TECHNIQUE: Multidetector CT imaging of the abdomen and pelvis was performed using the standard protocol following bolus administration of intravenous contrast.  CONTRAST:  OMNIPAQUE IOHEXOL 300 MG/ML SOLN, 50mL OMNIPAQUE IOHEXOL 300 MG/ML SOLN  COMPARISON:  None.  FINDINGS: Lung bases are free of acute infiltrate or sizable effusion. A small diaphragmatic rent is noted on the left containing fat.  The liver is diffusely fatty infiltrated. The gallbladder, spleen, adrenal glands and pancreas are all normal in their CT appearance. Kidneys are somewhat  shrunken and demonstrate multiple cystic areas this is consistent with the patient's given clinical history of chronic renal disease. The appendix is within normal limits.  The bladder is decompressed. No pelvic mass lesion or free pelvic fluid is seen. No lymphadenopathy is noted. Bony structures are within normal limits.  IMPRESSION: Chronic changes without acute abnormality.   Electronically Signed   By: Alcide Clever M.D.   On: 11/16/2013 21:17   Dg Chest Port 1 View  11/16/2013   CLINICAL DATA:  Chest pain  EXAM: PORTABLE CHEST - 1 VIEW  COMPARISON:  09/30/2013  FINDINGS: Cardiac shadow is within normal limits. The lungs are well aerated bilaterally. Mild elevation the right hemidiaphragm is again noted. No bony abnormality is seen.  IMPRESSION: No active disease.   Electronically Signed   By: Alcide Clever M.D.   On: 11/16/2013 19:24    EKG: Independently reviewed. Sinus tachycardia with rate of 102.  No oral QRS intervals. No ST changes  Assessment/Plan Present on Admission:  . Abdominal pain  #1 abdominal pain - uncertain etiology CT scan was unremarkable. Pancreatic enzymes, liver function tests were unremarkable. White count slightly elevated. We'll admit the patient for observation and provide pain control and attempt to further identify the cause of patient's abdominal pain. Certainly 1-6% of people on Nexium can have abdominal pain is an adverse reaction. We'll hold the Nexium and give the patient Pepcid for acid reduction. We'll also give the patient Bentyl for cramping to see if this will help with some of the patient's pain. We'll also consult GI for further assistance in working up the patient's abdominal pain.  #2 end-stage renal disease The patient's dialysis schedule is Monday, Tuesday, Thursday, Friday, Saturday.   #3 hypotension Asymptomatic. Result of dialysis  DVT prophylaxis: SCDs  Consultants: gastroenterology  Code Status: Full  Family Communication: Mother in the  room   Disposition Plan: Home following consult  Time spent: 50 minutes  Candelaria CelesteJacob Deklyn Trachtenberg, [place]DO [facility] Pager [phone]

## 2013-11-16 NOTE — Progress Notes (Signed)
Spoke to Dr Rosalia Hammersay @ 2045 prior to injection of contrast media to make sure still was desired. Creat=17.48. MOB

## 2013-11-17 ENCOUNTER — Encounter (HOSPITAL_COMMUNITY): Payer: Self-pay | Admitting: *Deleted

## 2013-11-17 ENCOUNTER — Observation Stay (HOSPITAL_COMMUNITY): Payer: Medicare Other

## 2013-11-17 ENCOUNTER — Telehealth: Payer: Self-pay | Admitting: Gastroenterology

## 2013-11-17 DIAGNOSIS — I953 Hypotension of hemodialysis: Secondary | ICD-10-CM

## 2013-11-17 DIAGNOSIS — N2589 Other disorders resulting from impaired renal tubular function: Secondary | ICD-10-CM | POA: Diagnosis not present

## 2013-11-17 DIAGNOSIS — R1013 Epigastric pain: Secondary | ICD-10-CM | POA: Diagnosis not present

## 2013-11-17 DIAGNOSIS — N2581 Secondary hyperparathyroidism of renal origin: Secondary | ICD-10-CM | POA: Diagnosis not present

## 2013-11-17 DIAGNOSIS — D509 Iron deficiency anemia, unspecified: Secondary | ICD-10-CM | POA: Diagnosis not present

## 2013-11-17 DIAGNOSIS — Z4931 Encounter for adequacy testing for hemodialysis: Secondary | ICD-10-CM | POA: Diagnosis not present

## 2013-11-17 DIAGNOSIS — D631 Anemia in chronic kidney disease: Secondary | ICD-10-CM | POA: Diagnosis not present

## 2013-11-17 DIAGNOSIS — I959 Hypotension, unspecified: Secondary | ICD-10-CM

## 2013-11-17 DIAGNOSIS — N186 End stage renal disease: Secondary | ICD-10-CM | POA: Diagnosis not present

## 2013-11-17 DIAGNOSIS — R1011 Right upper quadrant pain: Secondary | ICD-10-CM | POA: Diagnosis not present

## 2013-11-17 LAB — BASIC METABOLIC PANEL
BUN: 72 mg/dL — AB (ref 6–23)
CALCIUM: 7.8 mg/dL — AB (ref 8.4–10.5)
CO2: 20 mEq/L (ref 19–32)
Chloride: 86 mEq/L — ABNORMAL LOW (ref 96–112)
Creatinine, Ser: 18.17 mg/dL — ABNORMAL HIGH (ref 0.50–1.35)
GFR, EST AFRICAN AMERICAN: 3 mL/min — AB (ref >90)
GFR, EST NON AFRICAN AMERICAN: 3 mL/min — AB (ref >90)
Glucose, Bld: 98 mg/dL (ref 70–99)
POTASSIUM: 4.9 meq/L (ref 3.7–5.3)
Sodium: 133 mEq/L — ABNORMAL LOW (ref 137–147)

## 2013-11-17 LAB — CBC
HCT: 39.8 % (ref 39.0–52.0)
HEMOGLOBIN: 13 g/dL (ref 13.0–17.0)
MCH: 31.2 pg (ref 26.0–34.0)
MCHC: 32.7 g/dL (ref 30.0–36.0)
MCV: 95.4 fL (ref 78.0–100.0)
PLATELETS: 336 10*3/uL (ref 150–400)
RBC: 4.17 MIL/uL — ABNORMAL LOW (ref 4.22–5.81)
RDW: 16.4 % — ABNORMAL HIGH (ref 11.5–15.5)
WBC: 10.5 10*3/uL (ref 4.0–10.5)

## 2013-11-17 LAB — CORTISOL-AM, BLOOD: CORTISOL - AM: 15.8 ug/dL (ref 4.3–22.4)

## 2013-11-17 MED ORDER — DICYCLOMINE HCL 10 MG PO CAPS
10.0000 mg | ORAL_CAPSULE | Freq: Three times a day (TID) | ORAL | Status: DC | PRN
  Filled 2013-11-17: qty 1

## 2013-11-17 MED ORDER — PANTOPRAZOLE SODIUM 40 MG PO TBEC: 40.0000 mg | DELAYED_RELEASE_TABLET | Freq: Two times a day (BID) | ORAL | Status: DC

## 2013-11-17 NOTE — Discharge Summary (Signed)
Physician Discharge Summary  Dana Allanyrone E Barnhardt [id number] DOB: May 01, 1970 DOA: 11/16/2013  PCP: Frazier RichardsIXON,MARY BETH, PA-C  Admit date: 11/16/2013 Discharge date: 11/17/2013  Recommendations for Outpatient Follow-up:  1. Final results of abdominal ultrasound (verbal read was given as documented below, currently the system will not allow for import of report) 2. Consider outpatient upper endoscopy to further evaluate epigastric and right upper quadrant abdominal pain without evidence of cholelithiasis or biliary colic. Nexium stopped.   Follow-up Information    [facility] Schedule an appointment as soon as possible for a visit in 1 week.   Specialty:  Physician Assistant   Contact information:   [address] [place] [address] [phone]       Follow up with Jonette EvaSandi Fields, MD. Schedule an appointment as soon as possible for a visit in 2 weeks.   Specialty:  Gastroenterology   Contact information:   [address] [place] [address] [phone]      Discharge Diagnoses:  1. Epigastric abdominal pain with radiation to the right upper quadrant 2. Hypotension, related to dialysis, resolved 3. GERD 4. End-stage renal disease  Discharge Condition: improved Disposition: home  Diet recommendation: bland low fat diet  Filed Weights   11/16/13 1823  Weight: 129.275 kg (285 lb)    History of present illness:  43 year old man with history of end-stage renal disease on peritoneal dialysis at home presented with one-month history of increasing, unremitting abdominal pain, associated with foods. Reports pain started after taking Nexium for GERD. Seen by his gastroenterologist 10/30 with plans for outpatient CT. Noted to be hypotensive in ED which apparently is baseline. Admitted for abdominal pain of uncertain etiology with unremarkable CT abdomen and pelvis, pancreatic enzymes and liver function  tests.  Hospital Course:  Abdominal pain spontaneously resolved. The patient correlates this with the initiation of Nexium which is certainly a possibility. He is currently tolerating a salad for lunch without pain. Abdominal ultrasound was unremarkable. Case was discussed with gastroenterology and outpatient follow-up will be pursued with consideration given to upper endoscopy for further evaluation of his pain. Given resolution of pain and toleration of diet plan discharge home with outpatient follow-up.  1. Epigastric abdominal pain with radiation to right upper quadrant and right flank resident 1 month. Resolved, eating a salad. By history worse with high fat meals. Consideration was given to adverse reaction to Nexium. CT abdomen and pelvis unremarkable, abdominal ultrasound as below with no acute findings. 2. Hypotension. Likely related to dialysis. Resolved. 3. GERD on Nexium. Hold Nexium for now as could be causative agent.  4. ESRD M, T, Tr, F home hemodialysis. 5. History of seizure disorder.   Currently pain-free. Tolerating diet. Abdominal ultrasound per Dr. Cecilio AsperStahl--very fatty liver which limits the evaluation but no gross abnormalities of the gallbladder, no cholelithiasis. Could not assess common bile duct. (This is a verbal read as Epic is down). CT of the abdomen and pelvis was unremarkable.  Patient desires discharge home, given the chronicity of his pain and negative workup I think this is reasonable. Recommend stopping Nexium.   He plans to follow-up with gastroenterology here in town, Dr. Darrick PennaFields has provided him with card. Recommend outpatient upper endoscopy.  Consultants: 6. gastroenterology  Procedures:    Antibiotics: none  Discharge Instructions  Discharge Instructions    Activity as tolerated - No restrictions    Complete by:  As directed  Discharge instructions    Complete by:  As directed   Call your physician or seek immediate medical attention  for recurrent abdominal pain, new vomiting, fever or worsening of condition. Follow-up with gastroenterologist of your choice. Recommend low-fat diet. Stop Nexium.          Current Discharge Medication List    CONTINUE these medications which have NOT CHANGED   Details  alprazolam (XANAX) 2 MG tablet Take 1 tablet (2 mg total) by mouth at bedtime as needed for sleep. Qty: 30 tablet, Refills: 2    aspirin EC 81 MG tablet Take 81 mg by mouth daily.    cinacalcet (SENSIPAR) 60 MG tablet Take 60 mg by mouth every evening.     diltiazem 2 % GEL Apply 1 application topically 3 (three) times daily. Qty: 30 g, Refills: 3    multivitamin (RENA-VIT) TABS tablet Take 1 tablet by mouth daily.    Oxycodone HCl 10 MG TABS Take 10 mg by mouth every 8 (eight) hours as needed (for pain).    sevelamer (RENVELA) 800 MG tablet Take 3,200 mg by mouth 3 (three) times daily with meals.        No Known Allergies  The results of significant diagnostics from this hospitalization (including imaging, microbiology, ancillary and laboratory) are listed below for reference.    Significant Diagnostic Studies: Ct Abdomen Pelvis W Contrast  11/16/2013   CLINICAL DATA:  Mid abdominal pain  EXAM: CT ABDOMEN AND PELVIS WITH CONTRAST  TECHNIQUE: Multidetector CT imaging of the abdomen and pelvis was performed using the standard protocol following bolus administration of intravenous contrast.  CONTRAST:  100mL OMNIPAQUE IOHEXOL 300 MG/ML SOLN, 50mL OMNIPAQUE IOHEXOL 300 MG/ML SOLN  COMPARISON:  None.  FINDINGS: Lung bases are free of acute infiltrate or sizable effusion. A small diaphragmatic rent is noted on the left containing fat.  The liver is diffusely fatty infiltrated. The gallbladder, spleen, adrenal glands and pancreas are all normal in their CT appearance. Kidneys are somewhat shrunken and demonstrate multiple cystic areas this is consistent with the patient's given clinical history of chronic renal disease.  The appendix is within normal limits.  The bladder is decompressed. No pelvic mass lesion or free pelvic fluid is seen. No lymphadenopathy is noted. Bony structures are within normal limits.  IMPRESSION: Chronic changes without acute abnormality.   Electronically Signed   By: Alcide CleverMark  Lukens M.D.   On: 11/16/2013 21:17   Dg Chest Port 1 View  11/16/2013   CLINICAL DATA:  Chest pain  EXAM: PORTABLE CHEST - 1 VIEW  COMPARISON:  09/30/2013  FINDINGS: Cardiac shadow is within normal limits. The lungs are well aerated bilaterally. Mild elevation the right hemidiaphragm is again noted. No bony abnormality is seen.  IMPRESSION: No active disease.   Electronically Signed   By: Alcide CleverMark  Lukens M.D.   On: 11/16/2013 19:24     Labs: Basic Metabolic Panel:  Recent Labs Lab 11/16/13 1905 11/17/13 0557  NA 136* 133*  K 4.6 4.9  CL 87* 86*  CO2 20 20  GLUCOSE 118* 98  BUN 68* 72*  CREATININE 17.48* 18.17*  CALCIUM 8.0* 7.8*   Liver Function Tests:  Recent Labs Lab 11/16/13 1905  AST 25  ALT 43  ALKPHOS 128*  BILITOT 0.2*  PROT 8.6*  ALBUMIN 3.5    Recent Labs Lab 11/16/13 1905  LIPASE 22   CBC:  Recent Labs Lab 11/16/13 1905 11/17/13 0557  WBC 11.3* 10.5  NEUTROABS 8.8*  --  HGB 14.4 13.0  HCT 42.9 39.8  MCV 94.7 95.4  PLT 319 336    Active Problems:   Abdominal pain   Time coordinating discharge: 35 minutes  Signed:  Brendia Sacks, MD [facility] 11/17/2013, 12:22 PM

## 2013-11-17 NOTE — Progress Notes (Signed)
PROGRESS NOTE  Perry [name] [id number] DOB: [birth year] DOA: 11/16/2013 PCP: Frazier RichardsIXON,MARY BETH, PA-C  Summary: 43 year old man with history of end-stage renal disease on peritoneal dialysis at home presented with one-month history of increasing, unremitting abdominal pain, associated with foods. Reports pain started after taking Nexium for GERD. Seen by his gastroenterologist 10/30 with plans for outpatient CT chest. Noted to be hypotensive which apparently is baseline.admitted for abdominal pain of uncertain etiology with unremarkable CT abdomen and pelvis, pancreatic enzymes and liver function tests.  Assessment/Plan: 1. Epigastric abdominal pain with radiation to right upper quadrant and right flank resident 1 month. Resolved, eating a salad. By history worse with high fat meals.  Consideration was given to adverse reaction to Nexium.CT abdomen and pelvis unremarkable, abdominal ultrasound as below with no acute findings. 2. Hypotension. Likely related to dialysis. Resolved. 3. GERD on Nexium. Hold Nexium for now as could be causative agent.  4. ESRD M, T, Tr, F home hemodialysis. 5. History of seizure disorder.   Currently pain-free. Tolerating diet. Abdominal ultrasound per Dr. Cecilio AsperStahl--very fatty liver which limits the evaluation but no gross abnormalities of the gallbladder, no cholelithiasis. Could not assess common bile duct. (This is a verbal read as Epic is down). CT of the abdomen and pelvis was unremarkable.  Patient desires discharge home, given the chronicity of his pain and negative workup I think this is reasonable. Recommend stopping Nexium.   He plans to follow-up with gastroenterology here in town, Dr. Darrick PennaFields has provided him with card. Recommend outpatient upper endoscopy.  Brendia Sacksaniel Tanaysia Bhardwaj, MD  [facility]  Pager [phone] If 7PM-7AM, please contact night-coverage at [email or web address], password [email or web address] 11/17/2013, 7:45 AM  LOS: 1 day    Consultants:  gastroenterology  Procedures:    Antibiotics:    HPI/Subjective: Feels better today. No abdominal pain now. No vomiting. Hungry. He reports one month history of abdominal pain and he also started Nexium approximately one month ago for significant GERD. He was seen by Dr. Ewing SchleinMagod 10/30 and outpatient CT scan was ordered. He has never had endoscopy. He reports the pain is right upper quadrant and epigastric in nature and made worse by food.  Objective: Filed Vitals:   11/16/13 2224 11/16/13 2345 11/17/13 0610 11/17/13 0628  BP: 86/48 89/60 69/43  100/79  Pulse: 102 102 95   Temp:  99.7 F (37.6 C) 97.3 F (36.3 C)   TempSrc:  Oral Oral   Resp: 24 20 16    Height:      Weight:      SpO2: 100% 96% 96%     Intake/Output Summary (Last 24 hours) at 11/17/13 0745 Last data filed at 11/16/13 2152  Gross per 24 hour  Intake   1500 ml  Output      0 ml  Net   1500 ml     Filed Weights   11/16/13 1823  Weight: 129.275 kg (285 lb)    Exam:     Afebrile, vital signs stable with most recent blood pressure 100/79. No hypoxia.  General: appears calm, comfortable.  Psych: alert. Speech clear.  CV: regular rate and rhythm. No murmur, rub or gallop.1+ bilateral lower extremity edema.  Respiratory: clear to auscultation bilaterally. No wheezes, rales or rhonchi. Normal respiratory effort.  Abdomen: obese. Soft, nontender, nondistended.   Data Reviewed:  Basic metabolic panel consistent with end-stage renal disease. Potassium normal.lipase 22, LFTs essentially unremarkable with minimal elevation of alkaline phosphatase.  Leukocytosis resolved, 10.5. Hemoglobin, platelet count normal.  EKG normal sinus  rhythm, lateral T-wave inversion, no significant change since last test 06/06/2012.  Scheduled Meds: . aspirin EC  81 mg Oral Daily  . cinacalcet  60 mg Oral QPM  . dicyclomine  10 mg Oral TID AC  . diltiazem  1 application Topical TID  . pantoprazole  40  mg Oral QAC breakfast  . sevelamer carbonate  3,200 mg Oral TID WC  . sodium chloride  3 mL Intravenous Q12H   Continuous Infusions:   Active Problems:   Abdominal pain

## 2013-11-17 NOTE — Progress Notes (Signed)
UR completed 

## 2013-11-17 NOTE — Progress Notes (Signed)
Discharge teaching done with wife and pt.  Pt belongings collected.  Pt stable at time of discharge.  Pt taken via wheelchair to entrance to meet friends car.

## 2013-11-17 NOTE — Telephone Encounter (Signed)
PT NEEDS EGD POSSIBLE TCS IN 2-3 WEEKS, DX: NEW ONSET DYSPEPSIA. HE WILL KNOW REGARDING REQUEST FOR TCS AFTER NOV 4.   FOLLOW UP IN 4 MOS  E30 RUQ ABDOMINAL PAIN W/ SLF.

## 2013-11-17 NOTE — Progress Notes (Signed)
Advised pt that he was NPO for ABD ultrasound.  Pt stated he understood, and asked to take meds after procedure.  Pt stable and resting at this time.  Bed in lowest position, call bell in reach.

## 2013-11-17 NOTE — Consult Note (Addendum)
Referring Provider: No ref. provider found Primary Care Physician:  Frazier RichardsIXON,MARY BETH, PA-C Primary Gastroenterologist:  Jonette EvaSandi Arliene Rosenow  Reason for Consultation:  ABDOMINAL PAIN   Impression: ADMITTED WITH GI UPSET ASSOCIATED WITH NEXIUM. GI WORKUP REVEALS NO ACUTE DISEASE. PT TAKES ASA AND C/O RUQ PAIN. 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 RUQ PAIN MOST LIKELY DUE TO NSAID INDUCED GASTRITIS, DUODENITIS, OR PUD, LESS LIKELY CHOLECYSTITIS OR ADRENAL INSUFFICIENCY.  Plan: 1. RECOMMEND EGD AS AN OUTPATIENT PRIOR TO TRANSPLANT TO EVALUATE FOR H PYLORI GASTRITIS. PT ASKED TO CONTACT ME IF HE ALSO NEEDS A TCS. 2. BID PPI 3. STOP ASA FOR 2 WEEKS. CHECK AM CORTISOL LEVEL. 4. OPV IN 4 MOS W/ SLF 5. AWAIT RESULTS OF U/S   HPI:      PAIN FOR ABOUT ONE MONTH SINCE STARTING NEXIUM ASSOCIATED WITH CHANGE IN BOWEL HABITS(SOFT STOOL: 2X/DAYS). WOULD HAPPEN WHEN EATING AND IF HE LAID ON HIS BACK. STARTED ON NEXIUM FOR HEARTBURN. WAS USING BAKING SODA PRN. PAIN WOULD BE SHARP, DULL, ACHY AND SO SEVERE HE HAD TO CURL UP INTO A BALL. SX WOULD LAST MINS AND COME AND GO AND THAT WOULD GO ON FOR HOURS. FELT BETTER WITH A HEATING PAD. NO NAUSEA OR VOMITING. NEXIUM CONTROLLED HEARTBURN BUT CAUSED BELLY PAIN. BMs: 2X/DAY USU NL FORMED.   PT DENIES FEVER, CHILLS, HEMATOCHEZIA, HEMATEMESIS, nausea, vomiting, melena, diarrhea, CHEST PAIN, SHORTNESS OF BREATH, CHANGE IN BOWEL IN HABITS, constipation, problems swallowing, problems with sedation, heartburn or indigestion.   Past Medical History  Diagnosis Date  . Dialysis patient   . Kidney failure   . High blood pressure     Past Surgical History  Procedure Laterality Date  . Av fistula repair      Prior to Admission medications   Medication Sig Start Date End Date Taking? Authorizing Provider  alprazolam Prudy Feeler(XANAX) 2 MG tablet Take 1 tablet (2 mg total) by mouth at bedtime as needed for sleep. 10/29/13  Yes Donita BrooksWarren T [name], MD  aspirin EC 81 MG tablet  Take 81 mg by mouth daily.   Yes Historical Provider, MD  cinacalcet (SENSIPAR) 60 MG tablet Take 60 mg by mouth every evening.    Yes Historical Provider, MD  diltiazem 2 % GEL Apply 1 application topically 3 (three) times daily. 10/18/13  Yes Donita BrooksWarren T [name], MD  multivitamin (RENA-VIT) TABS tablet Take 1 tablet by mouth daily.   Yes Historical Provider, MD  Oxycodone HCl 10 MG TABS Take 10 mg by mouth every 8 (eight) hours as needed (for pain).   Yes Historical Provider, MD  sevelamer (RENVELA) 800 MG tablet Take 3,200 mg by mouth 3 (three) times daily with meals.    Yes Historical Provider, MD    Current Facility-Administered Medications  Medication Dose Route Frequency Provider Last Rate Last Dose  . 0.9 %  sodium chloride infusion  250 mL Intravenous PRN Rhona RaiderJacob J Stinson, DO      . acetaminophen (TYLENOL) tablet 650 mg  650 mg Oral Q6H PRN Levie HeritageJacob J Stinson, DO       Or  . acetaminophen (TYLENOL) suppository 650 mg  650 mg Rectal Q6H PRN Rhona RaiderJacob J Stinson, DO      . ALPRAZolam Prudy Feeler(XANAX) tablet 2 mg  2 mg Oral QHS PRN Rhona RaiderJacob J Stinson, DO   2 mg at 11/17/13 0130  . alum & mag hydroxide-simeth (MAALOX/MYLANTA) 200-200-20 MG/5ML suspension 30 mL  30 mL Oral Q6H PRN Rhona RaiderJacob J Stinson, DO      .  aspirin EC tablet 81 mg  81 mg Oral Daily Rhona Raider Stinson, DO   81 mg at 11/17/13 1610  . cinacalcet (SENSIPAR) tablet 60 mg  60 mg Oral QPM Rhona Raider Stinson, DO      . dicyclomine (BENTYL) capsule 10 mg  10 mg Oral TID AC Rhona Raider Stinson, DO   10 mg at 11/17/13 9604  . diltiazem 2 % gel 1 application  1 application Topical TID Levie Heritage, DO   1 application at 11/16/13 2330  . docusate sodium (COLACE) capsule 100 mg  100 mg Oral Daily PRN Levie Heritage, DO      . morphine 2 MG/ML injection 2 mg  2 mg Intravenous Q2H PRN Rhona Raider Stinson, DO   2 mg at 11/16/13 2351  . ondansetron (ZOFRAN) tablet 4 mg  4 mg Oral Q6H PRN Rhona Raider Stinson, DO       Or  . ondansetron [facility]) injection 4 mg  4 mg Intravenous  Q6H PRN Rhona Raider Stinson, DO      . oxyCODONE (Oxy IR/ROXICODONE) immediate release tablet 10 mg  10 mg Oral Q8H PRN Rhona Raider Stinson, DO      . pantoprazole (PROTONIX) EC tablet 40 mg  40 mg Oral QAC breakfast Haydee Monica, MD   40 mg at 11/17/13 5409  . sevelamer carbonate (RENVELA) tablet 3,200 mg  3,200 mg Oral TID WC Rhona Raider Stinson, DO   3,200 mg at 11/17/13 0800  . sodium chloride 0.9 % injection 3 mL  3 mL Intravenous Q12H Rhona Raider Stinson, DO   3 mL at 11/17/13 0926  . sodium chloride 0.9 % injection 3 mL  3 mL Intravenous PRN Levie Heritage, DO        Allergies as of 11/16/2013  . (No Known Allergies)    Family History  Problem Relation Age of Onset  . High blood pressure Mother      History   Social History  . Marital Status: Married    Spouse Name: N/A    Number of Children: 2  . Years of Education: 13   Occupational History  .      Disabled   Social History Main Topics  . Smoking status: Former Smoker -- 1.00 packs/day for 20 years    Types: Cigarettes    Quit date: 06/17/2013  . Smokeless tobacco: Never Used  . Alcohol Use: No  . Drug Use: No  . Sexual Activity: Yes   Other Topics Concern  . Not on file   Social History Narrative   Patient lives home at home with his wife Hydrographic surveyor ). Patient is disabled.    Caffeine-one cup of coffee daily.   Right handed.    Review of Systems: PER HPI OTHERWISE ALL SYSTEMS ARE NEGATIVE.   Vitals: Blood pressure 100/79, pulse 95, temperature 97.3 F (36.3 C), temperature source Oral, resp. rate 16, height 5\' 6"  (1.676 m), weight 285 lb (129.275 kg), SpO2 96 %.  Physical Exam: General:   Alert,  Well-developed, well-nourished, pleasant and cooperative in NAD Head:  Normocephalic and atraumatic. Eyes:  Sclera clear, no icterus.   Conjunctiva pink. Mouth:  No lesions, dentition normal. Neck:  Supple; Lungs:  Clear throughout to auscultation.   No wheezes. No acute distress. Heart:  Regular rate and rhythm; no  murmurs. Abdomen:  Soft, nontender and nondistended. No masses noted. Normal bowel sounds, without guarding, and without rebound.   Msk:  Symmetrical without gross  deformities. Normal posture. Extremities:  Without edema. Neurologic:  Alert and  oriented x4;  grossly normal neurologically. Cervical Nodes:  No significant cervical adenopathy. Psych:  Alert and cooperative. Normal mood and affect.  Lab Results:  Recent Labs  11/16/13 1905 11/17/13 0557  WBC 11.3* 10.5  HGB 14.4 13.0  HCT 42.9 39.8  PLT 319 336   BMET  Recent Labs  11/16/13 1905 11/17/13 0557  NA 136* 133*  K 4.6 4.9  CL 87* 86*  CO2 20 20  GLUCOSE 118* 98  BUN 68* 72*  CREATININE 17.48* 18.17*  CALCIUM 8.0* 7.8*   LFT  Recent Labs  11/16/13 1905  PROT 8.6*  ALBUMIN 3.5  AST 25  ALT 43  ALKPHOS 128*  BILITOT 0.2*    Studies/Results:OCT 31 CT ABD/PELVIS  NAIAP, CXR: NACPD   LOS: 1 day   Solei Wubben  11/17/2013, 10:39 AM

## 2013-11-18 NOTE — Telephone Encounter (Signed)
Left message with mother to call us back

## 2013-11-18 NOTE — Telephone Encounter (Signed)
ON RECALL LIST  °

## 2013-11-18 NOTE — Telephone Encounter (Signed)
Gastroenterology Pre-Procedure Review  Request Date: 11/18/2013 Requesting Physician: Dr. Darrick PennaFields   ( EGD / ? TCS )  PATIENT REVIEW QUESTIONS: The patient responded to the following health history questions as indicated:    1. Diabetes Melitis: no 2. Joint replacements in the past 12 months: no 3. Major health problems in the past 3 months:Abdominal pain and seen at the ED 4. Has an artificial valve or MVP: no 5. Has a defibrillator: no 6. Has been advised in past to take antibiotics in advance of a procedure like teeth cleaning: no    MEDICATIONS & ALLERGIES:    Patient reports the following regarding taking any blood thinners:   Plavix? no Aspirin? no Coumadin? no  Patient confirms/reports the following medications:  Current Outpatient Prescriptions  Medication Sig Dispense Refill  . alprazolam (XANAX) 2 MG tablet Take 1 tablet (2 mg total) by mouth at bedtime as needed for sleep. 30 tablet 2  . cinacalcet (SENSIPAR) 60 MG tablet Take 60 mg by mouth every evening.     . diltiazem 2 % GEL Apply 1 application topically 3 (three) times daily. 30 g 3  . multivitamin (RENA-VIT) TABS tablet Take 1 tablet by mouth daily.    . Oxycodone HCl 10 MG TABS Take 10 mg by mouth every 8 (eight) hours as needed (for pain).    [name]. sevelamer (RENVELA) 800 MG tablet Take 3,200 mg by mouth 3 (three) times daily with meals.     [name]. aspirin EC 81 MG tablet Take 81 mg by mouth daily.     No current facility-administered medications for this visit.    Patient confirms/reports the following allergies:  No Known Allergies  No orders of the defined types were placed in this encounter.    AUTHORIZATION INFORMATION Primary Insurance:   ID #:  Group #:  Pre-Cert / Auth required Pre-Cert / Auth #:   Secondary Insurance:   ID #:   Group #:  Pre-Cert / Auth required: Pre-Cert / Auth #:   SCHEDULE INFORMATION: Procedure has been scheduled as follows:  Date:  Time:   Location:   This Gastroenterology  Pre-Precedure Review Form is being routed to the following provider(s): Jonette EvaSandi Fields, MD

## 2013-11-18 NOTE — Addendum Note (Signed)
Addended by: Lavena BullionSTEWART, Swayze Kozuch H on: 11/18/2013 04:01 PM   Modules accepted: Medications

## 2013-11-19 ENCOUNTER — Ambulatory Visit (HOSPITAL_COMMUNITY): Admission: RE | Admit: 2013-11-19 | Payer: Medicare Other | Source: Ambulatory Visit

## 2013-11-19 NOTE — Telephone Encounter (Signed)
REVIEWED. AGREE. NO ADDITIONAL RECOMMENDATIONS. 

## 2013-11-25 DIAGNOSIS — R1013 Epigastric pain: Secondary | ICD-10-CM | POA: Insufficient documentation

## 2013-11-27 DIAGNOSIS — E785 Hyperlipidemia, unspecified: Secondary | ICD-10-CM | POA: Diagnosis not present

## 2013-11-27 NOTE — Telephone Encounter (Signed)
I spoke to pt. He has an appt in reference to his kidney transplant on 12/02/2013. He wants to wait until after that appt to schedule procedure. They are to tell him if he should have a Colonoscopy also.

## 2013-11-29 ENCOUNTER — Ambulatory Visit: Payer: Self-pay | Admitting: Pain Medicine

## 2013-11-29 DIAGNOSIS — M25569 Pain in unspecified knee: Secondary | ICD-10-CM | POA: Diagnosis not present

## 2013-11-29 DIAGNOSIS — M79603 Pain in arm, unspecified: Secondary | ICD-10-CM | POA: Diagnosis not present

## 2013-11-29 DIAGNOSIS — M47892 Other spondylosis, cervical region: Secondary | ICD-10-CM | POA: Diagnosis not present

## 2013-11-29 DIAGNOSIS — M549 Dorsalgia, unspecified: Secondary | ICD-10-CM | POA: Diagnosis not present

## 2013-11-29 DIAGNOSIS — M79641 Pain in right hand: Secondary | ICD-10-CM | POA: Diagnosis not present

## 2013-11-29 DIAGNOSIS — M79642 Pain in left hand: Secondary | ICD-10-CM | POA: Diagnosis not present

## 2013-11-29 DIAGNOSIS — M545 Low back pain: Secondary | ICD-10-CM | POA: Diagnosis not present

## 2013-12-02 DIAGNOSIS — R001 Bradycardia, unspecified: Secondary | ICD-10-CM | POA: Diagnosis not present

## 2013-12-02 DIAGNOSIS — N186 End stage renal disease: Secondary | ICD-10-CM | POA: Diagnosis not present

## 2013-12-04 ENCOUNTER — Ambulatory Visit: Payer: Medicare Other | Admitting: Cardiology

## 2013-12-04 NOTE — Telephone Encounter (Signed)
REVIEWED.  

## 2013-12-09 ENCOUNTER — Ambulatory Visit (INDEPENDENT_AMBULATORY_CARE_PROVIDER_SITE_OTHER): Payer: Medicare Other | Admitting: Internal Medicine

## 2013-12-16 DIAGNOSIS — N186 End stage renal disease: Secondary | ICD-10-CM | POA: Diagnosis not present

## 2013-12-16 DIAGNOSIS — Z992 Dependence on renal dialysis: Secondary | ICD-10-CM | POA: Diagnosis not present

## 2013-12-17 DIAGNOSIS — N2581 Secondary hyperparathyroidism of renal origin: Secondary | ICD-10-CM | POA: Diagnosis not present

## 2013-12-17 DIAGNOSIS — Z4931 Encounter for adequacy testing for hemodialysis: Secondary | ICD-10-CM | POA: Diagnosis not present

## 2013-12-17 DIAGNOSIS — D509 Iron deficiency anemia, unspecified: Secondary | ICD-10-CM | POA: Diagnosis not present

## 2013-12-17 DIAGNOSIS — Z79899 Other long term (current) drug therapy: Secondary | ICD-10-CM | POA: Diagnosis not present

## 2013-12-17 DIAGNOSIS — E878 Other disorders of electrolyte and fluid balance, not elsewhere classified: Secondary | ICD-10-CM | POA: Diagnosis not present

## 2013-12-17 DIAGNOSIS — E46 Unspecified protein-calorie malnutrition: Secondary | ICD-10-CM | POA: Diagnosis not present

## 2013-12-17 DIAGNOSIS — D631 Anemia in chronic kidney disease: Secondary | ICD-10-CM | POA: Diagnosis not present

## 2013-12-17 DIAGNOSIS — N186 End stage renal disease: Secondary | ICD-10-CM | POA: Diagnosis not present

## 2014-01-01 ENCOUNTER — Encounter: Payer: Self-pay | Admitting: Family Medicine

## 2014-01-02 NOTE — Telephone Encounter (Signed)
Pt is aware of results. 

## 2014-01-02 NOTE — Telephone Encounter (Signed)
PLEASE CALL PT. HIS CORTISOL IS NORMAL.

## 2014-01-12 ENCOUNTER — Encounter: Payer: Self-pay | Admitting: Family Medicine

## 2014-01-14 ENCOUNTER — Encounter: Payer: Self-pay | Admitting: Family Medicine

## 2014-01-16 ENCOUNTER — Encounter: Payer: Self-pay | Admitting: Family Medicine

## 2014-01-18 DIAGNOSIS — K769 Liver disease, unspecified: Secondary | ICD-10-CM | POA: Diagnosis not present

## 2014-01-18 DIAGNOSIS — N186 End stage renal disease: Secondary | ICD-10-CM | POA: Diagnosis not present

## 2014-01-18 DIAGNOSIS — N2589 Other disorders resulting from impaired renal tubular function: Secondary | ICD-10-CM | POA: Diagnosis not present

## 2014-01-18 DIAGNOSIS — Z4931 Encounter for adequacy testing for hemodialysis: Secondary | ICD-10-CM | POA: Diagnosis not present

## 2014-01-18 DIAGNOSIS — N2581 Secondary hyperparathyroidism of renal origin: Secondary | ICD-10-CM | POA: Diagnosis not present

## 2014-01-18 DIAGNOSIS — D509 Iron deficiency anemia, unspecified: Secondary | ICD-10-CM | POA: Diagnosis not present

## 2014-01-18 DIAGNOSIS — D631 Anemia in chronic kidney disease: Secondary | ICD-10-CM | POA: Diagnosis not present

## 2014-01-27 ENCOUNTER — Encounter: Payer: Self-pay | Admitting: Family Medicine

## 2014-01-27 ENCOUNTER — Telehealth: Payer: Self-pay | Admitting: Family Medicine

## 2014-01-27 DIAGNOSIS — N186 End stage renal disease: Secondary | ICD-10-CM | POA: Diagnosis not present

## 2014-01-28 ENCOUNTER — Encounter: Payer: Self-pay | Admitting: Family Medicine

## 2014-01-28 NOTE — Telephone Encounter (Signed)
ok 

## 2014-01-28 NOTE — Telephone Encounter (Signed)
Ok to refill??  Last office visit 10/18/2013.  Last refill 10/29/2013, #2 refills.

## 2014-01-28 NOTE — Telephone Encounter (Signed)
Medication called to pharmacy by fellow nurse.

## 2014-01-28 NOTE — Telephone Encounter (Signed)
Med called to pharm and pt aware via mychart 

## 2014-02-03 DIAGNOSIS — E785 Hyperlipidemia, unspecified: Secondary | ICD-10-CM | POA: Diagnosis not present

## 2014-02-13 ENCOUNTER — Other Ambulatory Visit (INDEPENDENT_AMBULATORY_CARE_PROVIDER_SITE_OTHER): Payer: Self-pay | Admitting: Surgery

## 2014-02-16 DIAGNOSIS — Z992 Dependence on renal dialysis: Secondary | ICD-10-CM | POA: Diagnosis not present

## 2014-02-16 DIAGNOSIS — N186 End stage renal disease: Secondary | ICD-10-CM | POA: Diagnosis not present

## 2014-02-17 DIAGNOSIS — N186 End stage renal disease: Secondary | ICD-10-CM | POA: Diagnosis not present

## 2014-02-17 DIAGNOSIS — D631 Anemia in chronic kidney disease: Secondary | ICD-10-CM | POA: Diagnosis not present

## 2014-02-17 DIAGNOSIS — D509 Iron deficiency anemia, unspecified: Secondary | ICD-10-CM | POA: Diagnosis not present

## 2014-02-17 DIAGNOSIS — Z79899 Other long term (current) drug therapy: Secondary | ICD-10-CM | POA: Diagnosis not present

## 2014-02-17 DIAGNOSIS — Z4931 Encounter for adequacy testing for hemodialysis: Secondary | ICD-10-CM | POA: Diagnosis not present

## 2014-02-17 DIAGNOSIS — Z23 Encounter for immunization: Secondary | ICD-10-CM | POA: Diagnosis not present

## 2014-02-17 DIAGNOSIS — N2581 Secondary hyperparathyroidism of renal origin: Secondary | ICD-10-CM | POA: Diagnosis not present

## 2014-02-17 DIAGNOSIS — E46 Unspecified protein-calorie malnutrition: Secondary | ICD-10-CM | POA: Diagnosis not present

## 2014-02-17 DIAGNOSIS — E878 Other disorders of electrolyte and fluid balance, not elsewhere classified: Secondary | ICD-10-CM | POA: Diagnosis not present

## 2014-02-18 DIAGNOSIS — T859XXA Unspecified complication of internal prosthetic device, implant and graft, initial encounter: Secondary | ICD-10-CM | POA: Diagnosis not present

## 2014-02-18 DIAGNOSIS — N186 End stage renal disease: Secondary | ICD-10-CM | POA: Diagnosis not present

## 2014-03-17 DIAGNOSIS — N186 End stage renal disease: Secondary | ICD-10-CM | POA: Diagnosis not present

## 2014-03-17 DIAGNOSIS — Z992 Dependence on renal dialysis: Secondary | ICD-10-CM | POA: Diagnosis not present

## 2014-03-18 ENCOUNTER — Other Ambulatory Visit (INDEPENDENT_AMBULATORY_CARE_PROVIDER_SITE_OTHER): Payer: Self-pay

## 2014-03-18 ENCOUNTER — Encounter: Payer: Self-pay | Admitting: Gastroenterology

## 2014-03-18 DIAGNOSIS — D509 Iron deficiency anemia, unspecified: Secondary | ICD-10-CM | POA: Diagnosis not present

## 2014-03-18 DIAGNOSIS — N186 End stage renal disease: Secondary | ICD-10-CM | POA: Diagnosis not present

## 2014-03-18 DIAGNOSIS — Z4931 Encounter for adequacy testing for hemodialysis: Secondary | ICD-10-CM | POA: Diagnosis not present

## 2014-03-18 DIAGNOSIS — E46 Unspecified protein-calorie malnutrition: Secondary | ICD-10-CM | POA: Diagnosis not present

## 2014-03-18 DIAGNOSIS — D631 Anemia in chronic kidney disease: Secondary | ICD-10-CM | POA: Diagnosis not present

## 2014-03-18 DIAGNOSIS — E878 Other disorders of electrolyte and fluid balance, not elsewhere classified: Secondary | ICD-10-CM | POA: Diagnosis not present

## 2014-03-18 DIAGNOSIS — Z01818 Encounter for other preprocedural examination: Secondary | ICD-10-CM

## 2014-03-18 DIAGNOSIS — Z79899 Other long term (current) drug therapy: Secondary | ICD-10-CM | POA: Diagnosis not present

## 2014-03-18 DIAGNOSIS — N2581 Secondary hyperparathyroidism of renal origin: Secondary | ICD-10-CM | POA: Diagnosis not present

## 2014-03-20 ENCOUNTER — Ambulatory Visit: Payer: Medicare Other | Admitting: Dietician

## 2014-03-25 ENCOUNTER — Ambulatory Visit (HOSPITAL_COMMUNITY)
Admission: RE | Admit: 2014-03-25 | Discharge: 2014-03-25 | Disposition: A | Discharge status: Home / Self Care | Payer: Medicare Other | Source: Ambulatory Visit | Attending: Surgery | Admitting: Surgery

## 2014-03-25 DIAGNOSIS — I10 Essential (primary) hypertension: Secondary | ICD-10-CM | POA: Insufficient documentation

## 2014-03-25 DIAGNOSIS — Z992 Dependence on renal dialysis: Secondary | ICD-10-CM | POA: Diagnosis not present

## 2014-03-25 DIAGNOSIS — Z6841 Body Mass Index (BMI) 40.0 and over, adult: Secondary | ICD-10-CM | POA: Diagnosis not present

## 2014-03-25 DIAGNOSIS — K219 Gastro-esophageal reflux disease without esophagitis: Secondary | ICD-10-CM | POA: Insufficient documentation

## 2014-03-25 DIAGNOSIS — N19 Unspecified kidney failure: Secondary | ICD-10-CM | POA: Insufficient documentation

## 2014-03-25 DIAGNOSIS — G4730 Sleep apnea, unspecified: Secondary | ICD-10-CM | POA: Diagnosis not present

## 2014-03-25 DIAGNOSIS — D638 Anemia in other chronic diseases classified elsewhere: Secondary | ICD-10-CM | POA: Insufficient documentation

## 2014-03-25 DIAGNOSIS — Z01818 Encounter for other preprocedural examination: Secondary | ICD-10-CM | POA: Diagnosis not present

## 2014-03-27 ENCOUNTER — Ambulatory Visit: Payer: Medicare Other | Admitting: Psychiatry

## 2014-04-01 ENCOUNTER — Ambulatory Visit (INDEPENDENT_AMBULATORY_CARE_PROVIDER_SITE_OTHER): Payer: Medicare Other | Admitting: Psychiatry

## 2014-04-01 ENCOUNTER — Encounter: Payer: Medicare Other | Attending: Surgery | Admitting: Dietician

## 2014-04-01 ENCOUNTER — Encounter: Payer: Self-pay | Admitting: Dietician

## 2014-04-01 DIAGNOSIS — Z0000 Encounter for general adult medical examination without abnormal findings: Secondary | ICD-10-CM | POA: Diagnosis not present

## 2014-04-01 DIAGNOSIS — N186 End stage renal disease: Secondary | ICD-10-CM | POA: Diagnosis not present

## 2014-04-01 DIAGNOSIS — Z713 Dietary counseling and surveillance: Secondary | ICD-10-CM | POA: Insufficient documentation

## 2014-04-01 DIAGNOSIS — Z6841 Body Mass Index (BMI) 40.0 and over, adult: Secondary | ICD-10-CM | POA: Diagnosis not present

## 2014-04-01 NOTE — Patient Instructions (Signed)

## 2014-04-01 NOTE — Progress Notes (Signed)
  Pre-Op Assessment Visit:  Pre-Operative Sleeve Gastrectomy Surgery  Medical Nutrition Therapy:  Appt start time: 1100   End time:  1145.  Patient was seen on 04/01/2014 for Pre-Operative Nutrition Assessment. [facility] on 04/01/2014.   Preferred Learning Style:   No preference indicated   Learning Readiness:   Ready  Handouts given during visit include:  Pre-Op Goals Bariatric Surgery Protein Shakes   During the appointment today the following Pre-Op Goals were reviewed with the patient: Maintain or lose weight as instructed by your surgeon Make healthy food choices Begin to limit portion sizes Limited concentrated sugars and fried foods Keep fat/sugar in the single digits per serving on   food labels Practice CHEWING your food  (aim for 30 chews per bite or until applesauce consistency) Practice not drinking 15 minutes before, during, and 30 minutes after each meal/snack Avoid all carbonated beverages  Avoid/limit caffeinated beverages  Avoid all sugar-sweetened beverages Consume 3 meals per day; eat every 3-5 hours Make a list of non-food related activities Aim for 64-100 ounces of FLUID daily  Aim for at least 60-80 grams of PROTEIN daily Look for a liquid protein source that contain ?15 g protein and ?5 g carbohydrate  (ex: shakes, drinks, shots)  Patient-Centered Goals: Would like to play more with his son and would like to have his blood pressure go up so he can go on the kidney transplant list. 10 confidence/10 importance  Demonstrated degree of understanding via:  Teach Back  Teaching Method Utilized:  Visual Auditory Hands on  Barriers to learning/adherence to lifestyle change: on dialysis 3 x week  Patient to call the [facility] to enroll in Pre-Op and Post-Op Nutrition Education when surgery date is scheduled.

## 2014-04-08 ENCOUNTER — Ambulatory Visit: Payer: Medicare Other | Admitting: Dietician

## 2014-04-10 ENCOUNTER — Ambulatory Visit (INDEPENDENT_AMBULATORY_CARE_PROVIDER_SITE_OTHER): Payer: Medicare Other | Admitting: Psychiatry

## 2014-04-17 DIAGNOSIS — Z992 Dependence on renal dialysis: Secondary | ICD-10-CM | POA: Diagnosis not present

## 2014-04-17 DIAGNOSIS — N186 End stage renal disease: Secondary | ICD-10-CM | POA: Diagnosis not present

## 2014-04-18 DIAGNOSIS — Z4931 Encounter for adequacy testing for hemodialysis: Secondary | ICD-10-CM | POA: Diagnosis not present

## 2014-04-18 DIAGNOSIS — N2581 Secondary hyperparathyroidism of renal origin: Secondary | ICD-10-CM | POA: Diagnosis not present

## 2014-04-18 DIAGNOSIS — D509 Iron deficiency anemia, unspecified: Secondary | ICD-10-CM | POA: Diagnosis not present

## 2014-04-18 DIAGNOSIS — N2589 Other disorders resulting from impaired renal tubular function: Secondary | ICD-10-CM | POA: Diagnosis not present

## 2014-04-18 DIAGNOSIS — N186 End stage renal disease: Secondary | ICD-10-CM | POA: Diagnosis not present

## 2014-04-18 DIAGNOSIS — D631 Anemia in chronic kidney disease: Secondary | ICD-10-CM | POA: Diagnosis not present

## 2014-04-19 DIAGNOSIS — Z4931 Encounter for adequacy testing for hemodialysis: Secondary | ICD-10-CM | POA: Diagnosis not present

## 2014-04-19 DIAGNOSIS — D509 Iron deficiency anemia, unspecified: Secondary | ICD-10-CM | POA: Diagnosis not present

## 2014-04-19 DIAGNOSIS — N2581 Secondary hyperparathyroidism of renal origin: Secondary | ICD-10-CM | POA: Diagnosis not present

## 2014-04-19 DIAGNOSIS — N186 End stage renal disease: Secondary | ICD-10-CM | POA: Diagnosis not present

## 2014-04-19 DIAGNOSIS — N2589 Other disorders resulting from impaired renal tubular function: Secondary | ICD-10-CM | POA: Diagnosis not present

## 2014-04-19 DIAGNOSIS — D631 Anemia in chronic kidney disease: Secondary | ICD-10-CM | POA: Diagnosis not present

## 2014-04-21 ENCOUNTER — Encounter: Payer: Medicare Other | Attending: Surgery

## 2014-04-21 DIAGNOSIS — Z6841 Body Mass Index (BMI) 40.0 and over, adult: Secondary | ICD-10-CM | POA: Insufficient documentation

## 2014-04-21 DIAGNOSIS — Z713 Dietary counseling and surveillance: Secondary | ICD-10-CM | POA: Diagnosis not present

## 2014-04-21 DIAGNOSIS — N2589 Other disorders resulting from impaired renal tubular function: Secondary | ICD-10-CM | POA: Diagnosis not present

## 2014-04-21 DIAGNOSIS — N2581 Secondary hyperparathyroidism of renal origin: Secondary | ICD-10-CM | POA: Diagnosis not present

## 2014-04-21 DIAGNOSIS — Z4931 Encounter for adequacy testing for hemodialysis: Secondary | ICD-10-CM | POA: Diagnosis not present

## 2014-04-21 DIAGNOSIS — D631 Anemia in chronic kidney disease: Secondary | ICD-10-CM | POA: Diagnosis not present

## 2014-04-21 DIAGNOSIS — N186 End stage renal disease: Secondary | ICD-10-CM | POA: Diagnosis not present

## 2014-04-21 DIAGNOSIS — D509 Iron deficiency anemia, unspecified: Secondary | ICD-10-CM | POA: Diagnosis not present

## 2014-04-21 NOTE — Progress Notes (Signed)
Please put orders in Epic surgery 05-05-14 pre op 04-25-14

## 2014-04-21 NOTE — Progress Notes (Signed)
  Pre-Operative Nutrition Class:  Appt start time: 1610   End time:  1830.  Patient was seen on 04/21/2014 for Pre-Operative Bariatric Surgery Education at the [facility].   Surgery date: 05/05/2014 Surgery type: Gastric sleeve Start weight at [facility]: 325 lbs on 04/01/14 Weight today: 327.5 lbs  TANITA  BODY COMP RESULTS  04/21/14   BMI (kg/m^2) 52.9   Fat Mass (lbs) 199.5   Fat Free Mass (lbs) 128   Total Body Water (lbs) 93.5   Samples given per MNT protocol. Patient educated on appropriate usage: Premier protein shake (qty 1 - vanilla) Lot #: [id number] Exp: 12/2014  Unjury protein powder (unflavored - qty 1) Lot #: [id number] Exp: 02/2015  Bariatric Advantage Calcium citrate chew (orange - qty 1) Lot #: [id number]  Exp: 05/2014  The following the learning objectives were met by the patient during this course:  Identify Pre-Op Dietary Goals and will begin 2 weeks pre-operatively  Identify appropriate sources of fluids and proteins   State protein recommendations and appropriate sources pre and post-operatively  Identify Post-Operative Dietary Goals and will follow for 2 weeks post-operatively  Identify appropriate multivitamin and calcium sources  Describe the need for physical activity post-operatively and will follow MD recommendations  State when to call healthcare provider regarding medication questions or post-operative complications  Handouts given during class include:  Pre-Op Bariatric Surgery Diet Handout  Protein Shake Handout  Post-Op Bariatric Surgery Nutrition Handout  BELT Program Information Flyer  Support Group Information Flyer  [facility] Bariatric Supplements Price List  Follow-Up Plan: Patient will follow-up at [facility] 2 weeks post operatively for diet advancement per MD.

## 2014-04-22 DIAGNOSIS — D631 Anemia in chronic kidney disease: Secondary | ICD-10-CM | POA: Diagnosis not present

## 2014-04-22 DIAGNOSIS — D509 Iron deficiency anemia, unspecified: Secondary | ICD-10-CM | POA: Diagnosis not present

## 2014-04-22 DIAGNOSIS — N2581 Secondary hyperparathyroidism of renal origin: Secondary | ICD-10-CM | POA: Diagnosis not present

## 2014-04-22 DIAGNOSIS — Z4931 Encounter for adequacy testing for hemodialysis: Secondary | ICD-10-CM | POA: Diagnosis not present

## 2014-04-22 DIAGNOSIS — N186 End stage renal disease: Secondary | ICD-10-CM | POA: Diagnosis not present

## 2014-04-22 DIAGNOSIS — N2589 Other disorders resulting from impaired renal tubular function: Secondary | ICD-10-CM | POA: Diagnosis not present

## 2014-04-23 ENCOUNTER — Ambulatory Visit: Payer: Self-pay | Admitting: Surgery

## 2014-04-23 DIAGNOSIS — N186 End stage renal disease: Secondary | ICD-10-CM | POA: Diagnosis not present

## 2014-04-23 NOTE — H&P (Signed)
Chief Complaint:  Obesity; Hemodialysis;  Sleeve gastrectomy to be candidate for transplant  History of Present Illness:  Perry [name] is an 44 y.o. male ready for sleeve gastrectomy.  Perry [name] is in today with formal former  [facility] patients who is helping him.  He is ready for sleeve gastrectomy.  They do his hemodialysis at home now which I was unaware that was offered.  He does complain of some problems with indigestion or heartburn but his upper GI showed no evidence of a hiatal hernia.  We will we'll plan to have hemodialysis the night before his surgery limp.  Would probably be good to go until Tuesday night or Wednesday morning after surgery.  They had no further questions.  There were the risks and benefits.  We'll proceed with sleeve gastrectomy.   Past Medical History  Diagnosis Date  . Dialysis patient   . Kidney failure   . High blood pressure   . Sleep apnea     Past Surgical History  Procedure Laterality Date  . Av fistula repair      Current Outpatient Prescriptions  Medication Sig Dispense Refill  . alprazolam (XANAX) 2 MG tablet TAKE ONE TABLET BY MOUTH AT BEDTIME AS NEEDED FOR SLEEP **MUST  LAST  30  DAYS  BETWEEN  REFILLS** 30 tablet 2  . calcitRIOL (ROCALTROL) 0.25 MCG capsule Take 0.75 mcg by mouth 3 (three) times daily with meals.     . cinacalcet (SENSIPAR) 60 MG tablet Take 120 mg by mouth every evening.     . diltiazem 2 % GEL Apply 1 application topically 3 (three) times daily. (Patient taking differently: Apply 1 application topically as directed. ) 30 g 3  . multivitamin (RENA-VIT) TABS tablet Take 1 tablet by mouth at bedtime.     . Oxycodone HCl 10 MG TABS Take 10 mg by mouth every 8 (eight) hours as needed (for pain).    [name]. sevelamer (RENVELA) 800 MG tablet Take 3,200 mg by mouth 3 (three) times daily with meals.      No current facility-administered medications for this visit.   Review of patient's allergies indicates no known allergies. Family  History  Problem Relation Age of Onset  . High blood pressure Mother    Social History:   reports that he quit smoking about 10 months ago. His smoking use included Cigarettes. He has a 20 pack-year smoking history. He has never used smokeless tobacco. He reports that he does not drink alcohol or use illicit drugs.   REVIEW OF SYSTEMS : Negative except for issues related to hemodiaylsis  Physical Exam:   There were no vitals taken for this visit. There is no weight on file to calculate BMI.  Gen:  WDWN AAM NAD  Neurological: Alert and oriented to person, place, and time. Motor and sensory function is grossly intact  Head: Normocephalic and atraumatic.  Eyes: Conjunctivae are normal. Pupils are equal, round, and reactive to light. No scleral icterus.  Neck: Normal range of motion. Neck supple. No tracheal deviation or thyromegaly present.  Cardiovascular:  SR without murmurs or gallops.  No carotid bruits Breast:  Not examined Respiratory: Effort normal.  No respiratory distress. No chest wall tenderness. Breath sounds normal.  No wheezes, rales or rhonchi.  Abdomen:  Obese, nontender GU:  Not examined Musculoskeletal: Normal range of motion. Extremities are nontender. No cyanosis, edema or clubbing noted Lymphadenopathy: No cervical, preauricular, postauricular or axillary adenopathy is present Skin: Skin is warm and dry.  No rash noted. No diaphoresis. No erythema. No pallor. Pscyh: Normal mood and affect. Behavior is normal. Judgment and thought content normal.   LABORATORY RESULTS: No results found for this or any previous visit (from the past 48 hour(s)).   RADIOLOGY RESULTS: No results found.  Problem List: Patient Active Problem List   Diagnosis Date Noted  . Epigastric abdominal pain   . Abdominal pain, epigastric 11/17/2013  . Hypotension 11/17/2013  . Abdominal pain 11/16/2013  . Seizures 06/06/2012  . ESRD on dialysis 06/06/2012  . Anemia of chronic disease  06/06/2012  . Secondary hyperparathyroidism (of renal origin) 06/06/2012  . HTN (hypertension) 06/06/2012  . 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      Matt B. Daphine DeutscherMartin, MD, [facility] Center[facility]  [facility] [phone] beeper [phone]  04/23/2014 11:30 AM

## 2014-04-24 DIAGNOSIS — G8929 Other chronic pain: Secondary | ICD-10-CM | POA: Diagnosis not present

## 2014-04-24 DIAGNOSIS — N186 End stage renal disease: Secondary | ICD-10-CM | POA: Diagnosis not present

## 2014-04-24 DIAGNOSIS — D631 Anemia in chronic kidney disease: Secondary | ICD-10-CM | POA: Diagnosis not present

## 2014-04-24 DIAGNOSIS — Z79891 Long term (current) use of opiate analgesic: Secondary | ICD-10-CM | POA: Diagnosis not present

## 2014-04-24 DIAGNOSIS — D509 Iron deficiency anemia, unspecified: Secondary | ICD-10-CM | POA: Diagnosis not present

## 2014-04-24 DIAGNOSIS — Z4931 Encounter for adequacy testing for hemodialysis: Secondary | ICD-10-CM | POA: Diagnosis not present

## 2014-04-24 DIAGNOSIS — M47817 Spondylosis without myelopathy or radiculopathy, lumbosacral region: Secondary | ICD-10-CM | POA: Diagnosis not present

## 2014-04-24 DIAGNOSIS — M1710 Unilateral primary osteoarthritis, unspecified knee: Secondary | ICD-10-CM | POA: Diagnosis not present

## 2014-04-24 DIAGNOSIS — Z5181 Encounter for therapeutic drug level monitoring: Secondary | ICD-10-CM | POA: Diagnosis not present

## 2014-04-24 DIAGNOSIS — N2581 Secondary hyperparathyroidism of renal origin: Secondary | ICD-10-CM | POA: Diagnosis not present

## 2014-04-24 DIAGNOSIS — N2589 Other disorders resulting from impaired renal tubular function: Secondary | ICD-10-CM | POA: Diagnosis not present

## 2014-04-24 DIAGNOSIS — M47816 Spondylosis without myelopathy or radiculopathy, lumbar region: Secondary | ICD-10-CM | POA: Diagnosis not present

## 2014-04-24 DIAGNOSIS — Z7189 Other specified counseling: Secondary | ICD-10-CM | POA: Diagnosis not present

## 2014-04-24 DIAGNOSIS — G4730 Sleep apnea, unspecified: Secondary | ICD-10-CM | POA: Diagnosis not present

## 2014-04-24 NOTE — Patient Instructions (Addendum)
Perry [name]  04/24/2014   Your procedure is scheduled on:  05/05/2014    Report to [facility] Main  Entrance and follow signs to               Short Stay Center at    1230pm AM.  Call this number if you have problems the morning of surgery [phone]   Remember:  Do not eat food after midnite,  May have clear liquids until 0730am then nothing by mouth am of surgery.       Take these medicines the morning of surgery with A SIP OF WATER: none                                You may not have any metal on your body including hair pins and              piercings  Do not wear jewelry, , lotions, powders or perfumes., deodorant.                           Men may shave face and neck.   Do not bring valuables to the hospital. [facility] IS NOT             RESPONSIBLE   FOR VALUABLES.  Contacts, dentures or bridgework may not be worn into surgery.  Leave suitcase in the car. After surgery it may be brought to your room.       Special Instructions: coughing and deep breathing exercises, leg exercises    CLEAR LIQUID DIET   Foods Allowed                                                                     Foods Excluded  Coffee and tea, regular and decaf                             liquids that you cannot  Plain Jell-O in any flavor                                             see through such as: Fruit ices (not with fruit pulp)                                     milk, soups, orange juice  Iced Popsicles                                    All solid food Carbonated beverages, regular and diet                                    Cranberry, grape and apple juices Sports drinks like  Gatorade Lightly seasoned clear broth or consume(fat free) Sugar, honey syrup  Sample Menu Breakfast                                Lunch                                     Supper Cranberry juice                    Beef broth                            Chicken broth Jell-O                                      Grape juice                           Apple juice Coffee or tea                        Jell-O                                      Popsicle                                                Coffee or tea                        Coffee or tea  _____________________________________________________________________                Please read over the following fact sheets you were given: _____________________________________________________________________             [facility] - Preparing for Surgery Before surgery, you can play an important role.  Because skin is not sterile, your skin needs to be as free of germs as possible.  You can reduce the number of germs on your skin by washing with CHG (chlorahexidine gluconate) soap before surgery.  CHG is an antiseptic cleaner which kills germs and bonds with the skin to continue killing germs even after washing. Please DO NOT use if you have an allergy to CHG or antibacterial soaps.  If your skin becomes reddened/irritated stop using the CHG and inform your nurse when you arrive at Short Stay. Do not shave (including legs and underarms) for at least 48 hours prior to the first CHG shower.  You may shave your face/neck. Please follow these instructions carefully:  1.  Shower with CHG Soap the night before surgery and the  morning of Surgery.  2.  If you choose to wash your hair, wash your hair first as usual with your  normal  shampoo.  3.  After you shampoo, rinse your hair and body thoroughly to remove the  shampoo.  4.  Use CHG as you would any other liquid soap.  You can apply chg directly  to the skin and wash                       Gently with a scrungie or clean washcloth.  5.  Apply the CHG Soap to your body ONLY FROM THE NECK DOWN.   Do not use on face/ open                           Wound or open sores. Avoid contact with eyes, ears mouth and genitals (private parts).                        Wash face,  Genitals (private parts) with your normal soap.             6.  Wash thoroughly, paying special attention to the area where your surgery  will be performed.  7.  Thoroughly rinse your body with warm water from the neck down.  8.  DO NOT shower/wash with your normal soap after using and rinsing off  the CHG Soap.                9.  Pat yourself dry with a clean towel.            10.  Wear clean pajamas.            11.  Place clean sheets on your bed the night of your first shower and do not  sleep with pets. Day of Surgery : Do not apply any lotions/deodorants the morning of surgery.  Please wear clean clothes to the hospital/surgery center.  FAILURE TO FOLLOW THESE INSTRUCTIONS MAY RESULT IN THE CANCELLATION OF YOUR SURGERY PATIENT SIGNATURE_________________________________  NURSE SIGNATURE__________________________________  ________________________________________________________________________

## 2014-04-25 ENCOUNTER — Encounter (HOSPITAL_COMMUNITY): Payer: Self-pay

## 2014-04-25 ENCOUNTER — Encounter (HOSPITAL_COMMUNITY)
Admission: RE | Admit: 2014-04-25 | Discharge: 2014-04-25 | Disposition: A | Discharge status: Home / Self Care | Payer: Medicare Other | Source: Ambulatory Visit | Attending: Surgery | Admitting: Surgery

## 2014-04-25 DIAGNOSIS — Z4931 Encounter for adequacy testing for hemodialysis: Secondary | ICD-10-CM | POA: Diagnosis not present

## 2014-04-25 DIAGNOSIS — Z01818 Encounter for other preprocedural examination: Secondary | ICD-10-CM | POA: Diagnosis not present

## 2014-04-25 DIAGNOSIS — D509 Iron deficiency anemia, unspecified: Secondary | ICD-10-CM | POA: Diagnosis not present

## 2014-04-25 DIAGNOSIS — N186 End stage renal disease: Secondary | ICD-10-CM | POA: Diagnosis not present

## 2014-04-25 DIAGNOSIS — N2581 Secondary hyperparathyroidism of renal origin: Secondary | ICD-10-CM | POA: Diagnosis not present

## 2014-04-25 DIAGNOSIS — D631 Anemia in chronic kidney disease: Secondary | ICD-10-CM | POA: Diagnosis not present

## 2014-04-25 DIAGNOSIS — N2589 Other disorders resulting from impaired renal tubular function: Secondary | ICD-10-CM | POA: Diagnosis not present

## 2014-04-25 HISTORY — DX: Other chronic pain: G89.29

## 2014-04-25 HISTORY — DX: Dorsalgia, unspecified: M54.9

## 2014-04-25 HISTORY — DX: Anxiety disorder, unspecified: F41.9

## 2014-04-25 HISTORY — DX: Unspecified tear of unspecified meniscus, current injury, left knee, initial encounter: S83.207A

## 2014-04-25 HISTORY — DX: Unspecified tear of unspecified meniscus, current injury, right knee, initial encounter: S83.206A

## 2014-04-25 HISTORY — DX: Dependence on renal dialysis: Z99.2

## 2014-04-25 HISTORY — DX: Gastro-esophageal reflux disease without esophagitis: K21.9

## 2014-04-25 NOTE — Progress Notes (Signed)
Requested LOV note and labs from [phone].  They are to fax.

## 2014-04-25 NOTE — Progress Notes (Signed)
ECHO- 12/11/2013 - EPIC  EKG- 03/25/2014 EPIC  CXR- 11/16/13 - EPIC

## 2014-04-25 NOTE — Progress Notes (Signed)
Patient aware to bring CPA mask and tubing day of surgery.

## 2014-04-26 DIAGNOSIS — N2589 Other disorders resulting from impaired renal tubular function: Secondary | ICD-10-CM | POA: Diagnosis not present

## 2014-04-26 DIAGNOSIS — D509 Iron deficiency anemia, unspecified: Secondary | ICD-10-CM | POA: Diagnosis not present

## 2014-04-26 DIAGNOSIS — Z4931 Encounter for adequacy testing for hemodialysis: Secondary | ICD-10-CM | POA: Diagnosis not present

## 2014-04-26 DIAGNOSIS — N2581 Secondary hyperparathyroidism of renal origin: Secondary | ICD-10-CM | POA: Diagnosis not present

## 2014-04-26 DIAGNOSIS — D631 Anemia in chronic kidney disease: Secondary | ICD-10-CM | POA: Diagnosis not present

## 2014-04-26 DIAGNOSIS — N186 End stage renal disease: Secondary | ICD-10-CM | POA: Diagnosis not present

## 2014-04-28 ENCOUNTER — Encounter (HOSPITAL_COMMUNITY): Payer: Self-pay

## 2014-04-28 ENCOUNTER — Encounter: Payer: Self-pay | Admitting: Family Medicine

## 2014-04-28 DIAGNOSIS — N2589 Other disorders resulting from impaired renal tubular function: Secondary | ICD-10-CM | POA: Diagnosis not present

## 2014-04-28 DIAGNOSIS — N2581 Secondary hyperparathyroidism of renal origin: Secondary | ICD-10-CM | POA: Diagnosis not present

## 2014-04-28 DIAGNOSIS — D631 Anemia in chronic kidney disease: Secondary | ICD-10-CM | POA: Diagnosis not present

## 2014-04-28 DIAGNOSIS — Z4931 Encounter for adequacy testing for hemodialysis: Secondary | ICD-10-CM | POA: Diagnosis not present

## 2014-04-28 DIAGNOSIS — D509 Iron deficiency anemia, unspecified: Secondary | ICD-10-CM | POA: Diagnosis not present

## 2014-04-28 DIAGNOSIS — N186 End stage renal disease: Secondary | ICD-10-CM | POA: Diagnosis not present

## 2014-04-28 DIAGNOSIS — E785 Hyperlipidemia, unspecified: Secondary | ICD-10-CM | POA: Diagnosis not present

## 2014-04-28 NOTE — Progress Notes (Signed)
Placed on chart LOV note 04/01/2014 from Dr Sabra Heckyan Sanford, nephrologist, on chart along with Last labs done 04/01/2014 on chart to include CBC/DIFF and CMP.

## 2014-04-29 ENCOUNTER — Telehealth: Payer: Self-pay | Admitting: Family Medicine

## 2014-04-29 ENCOUNTER — Other Ambulatory Visit: Payer: Self-pay | Admitting: Family Medicine

## 2014-04-29 DIAGNOSIS — N186 End stage renal disease: Secondary | ICD-10-CM | POA: Diagnosis not present

## 2014-04-29 DIAGNOSIS — Z4931 Encounter for adequacy testing for hemodialysis: Secondary | ICD-10-CM | POA: Diagnosis not present

## 2014-04-29 DIAGNOSIS — N2581 Secondary hyperparathyroidism of renal origin: Secondary | ICD-10-CM | POA: Diagnosis not present

## 2014-04-29 DIAGNOSIS — D509 Iron deficiency anemia, unspecified: Secondary | ICD-10-CM | POA: Diagnosis not present

## 2014-04-29 DIAGNOSIS — N2589 Other disorders resulting from impaired renal tubular function: Secondary | ICD-10-CM | POA: Diagnosis not present

## 2014-04-29 DIAGNOSIS — D631 Anemia in chronic kidney disease: Secondary | ICD-10-CM | POA: Diagnosis not present

## 2014-04-29 NOTE — Telephone Encounter (Signed)
ok 

## 2014-04-29 NOTE — Telephone Encounter (Signed)
Sent MyChart message on 4/11 for refill of Alprazolam.  Needs refill, has not heard anything.  LRF 01/28/14 #30 + 2.  LOV 10/18/13  OK refill?

## 2014-04-29 NOTE — Telephone Encounter (Signed)
rx called in

## 2014-04-29 NOTE — Telephone Encounter (Signed)
Pt made aware rx called in.  See Rx request form pharmacy

## 2014-05-01 DIAGNOSIS — N2581 Secondary hyperparathyroidism of renal origin: Secondary | ICD-10-CM | POA: Diagnosis not present

## 2014-05-01 DIAGNOSIS — D631 Anemia in chronic kidney disease: Secondary | ICD-10-CM | POA: Diagnosis not present

## 2014-05-01 DIAGNOSIS — Z4931 Encounter for adequacy testing for hemodialysis: Secondary | ICD-10-CM | POA: Diagnosis not present

## 2014-05-01 DIAGNOSIS — N2589 Other disorders resulting from impaired renal tubular function: Secondary | ICD-10-CM | POA: Diagnosis not present

## 2014-05-01 DIAGNOSIS — N186 End stage renal disease: Secondary | ICD-10-CM | POA: Diagnosis not present

## 2014-05-01 DIAGNOSIS — D509 Iron deficiency anemia, unspecified: Secondary | ICD-10-CM | POA: Diagnosis not present

## 2014-05-02 DIAGNOSIS — D631 Anemia in chronic kidney disease: Secondary | ICD-10-CM | POA: Diagnosis not present

## 2014-05-02 DIAGNOSIS — Z4931 Encounter for adequacy testing for hemodialysis: Secondary | ICD-10-CM | POA: Diagnosis not present

## 2014-05-02 DIAGNOSIS — N2589 Other disorders resulting from impaired renal tubular function: Secondary | ICD-10-CM | POA: Diagnosis not present

## 2014-05-02 DIAGNOSIS — D509 Iron deficiency anemia, unspecified: Secondary | ICD-10-CM | POA: Diagnosis not present

## 2014-05-02 DIAGNOSIS — N186 End stage renal disease: Secondary | ICD-10-CM | POA: Diagnosis not present

## 2014-05-02 DIAGNOSIS — N2581 Secondary hyperparathyroidism of renal origin: Secondary | ICD-10-CM | POA: Diagnosis not present

## 2014-05-03 DIAGNOSIS — N2581 Secondary hyperparathyroidism of renal origin: Secondary | ICD-10-CM | POA: Diagnosis not present

## 2014-05-03 DIAGNOSIS — N2589 Other disorders resulting from impaired renal tubular function: Secondary | ICD-10-CM | POA: Diagnosis not present

## 2014-05-03 DIAGNOSIS — Z4931 Encounter for adequacy testing for hemodialysis: Secondary | ICD-10-CM | POA: Diagnosis not present

## 2014-05-03 DIAGNOSIS — D509 Iron deficiency anemia, unspecified: Secondary | ICD-10-CM | POA: Diagnosis not present

## 2014-05-03 DIAGNOSIS — N186 End stage renal disease: Secondary | ICD-10-CM | POA: Diagnosis not present

## 2014-05-03 DIAGNOSIS — D631 Anemia in chronic kidney disease: Secondary | ICD-10-CM | POA: Diagnosis not present

## 2014-05-05 ENCOUNTER — Inpatient Hospital Stay (HOSPITAL_COMMUNITY)
Admission: RE | Admit: 2014-05-05 | Discharge: 2014-05-07 | DRG: 619 | Disposition: A | Discharge status: Home / Self Care | Payer: Medicare Other | Source: Ambulatory Visit | Attending: Surgery | Admitting: Surgery

## 2014-05-05 ENCOUNTER — Encounter (HOSPITAL_COMMUNITY)
Admission: RE | Disposition: A | Discharge status: Home / Self Care | Payer: Self-pay | Source: Ambulatory Visit | Attending: Surgery

## 2014-05-05 ENCOUNTER — Inpatient Hospital Stay (HOSPITAL_COMMUNITY): Payer: Medicare Other | Admitting: Anesthesiology

## 2014-05-05 ENCOUNTER — Encounter (HOSPITAL_COMMUNITY): Payer: Self-pay | Admitting: *Deleted

## 2014-05-05 DIAGNOSIS — Z87891 Personal history of nicotine dependence: Secondary | ICD-10-CM | POA: Diagnosis not present

## 2014-05-05 DIAGNOSIS — N2589 Other disorders resulting from impaired renal tubular function: Secondary | ICD-10-CM | POA: Diagnosis not present

## 2014-05-05 DIAGNOSIS — Z79899 Other long term (current) drug therapy: Secondary | ICD-10-CM

## 2014-05-05 DIAGNOSIS — Z992 Dependence on renal dialysis: Secondary | ICD-10-CM | POA: Diagnosis not present

## 2014-05-05 DIAGNOSIS — N186 End stage renal disease: Secondary | ICD-10-CM | POA: Diagnosis present

## 2014-05-05 DIAGNOSIS — Z6841 Body Mass Index (BMI) 40.0 and over, adult: Secondary | ICD-10-CM | POA: Diagnosis not present

## 2014-05-05 DIAGNOSIS — Z9884 Bariatric surgery status: Secondary | ICD-10-CM

## 2014-05-05 DIAGNOSIS — I10 Essential (primary) hypertension: Secondary | ICD-10-CM | POA: Diagnosis not present

## 2014-05-05 DIAGNOSIS — D509 Iron deficiency anemia, unspecified: Secondary | ICD-10-CM | POA: Diagnosis not present

## 2014-05-05 DIAGNOSIS — D631 Anemia in chronic kidney disease: Secondary | ICD-10-CM | POA: Diagnosis not present

## 2014-05-05 DIAGNOSIS — Z4931 Encounter for adequacy testing for hemodialysis: Secondary | ICD-10-CM | POA: Diagnosis not present

## 2014-05-05 DIAGNOSIS — G4730 Sleep apnea, unspecified: Secondary | ICD-10-CM | POA: Diagnosis not present

## 2014-05-05 DIAGNOSIS — I120 Hypertensive chronic kidney disease with stage 5 chronic kidney disease or end stage renal disease: Secondary | ICD-10-CM | POA: Diagnosis present

## 2014-05-05 DIAGNOSIS — N2581 Secondary hyperparathyroidism of renal origin: Secondary | ICD-10-CM | POA: Diagnosis not present

## 2014-05-05 DIAGNOSIS — M549 Dorsalgia, unspecified: Secondary | ICD-10-CM | POA: Diagnosis not present

## 2014-05-05 DIAGNOSIS — K219 Gastro-esophageal reflux disease without esophagitis: Secondary | ICD-10-CM | POA: Diagnosis not present

## 2014-05-05 DIAGNOSIS — Z01812 Encounter for preprocedural laboratory examination: Secondary | ICD-10-CM

## 2014-05-05 DIAGNOSIS — Z903 Acquired absence of stomach [part of]: Secondary | ICD-10-CM | POA: Diagnosis not present

## 2014-05-05 HISTORY — PX: LAPAROSCOPIC GASTRIC SLEEVE RESECTION: SHX5895

## 2014-05-05 LAB — CBC WITH DIFFERENTIAL/PLATELET
Basophils Absolute: 0 10*3/uL (ref 0.0–0.1)
Basophils Relative: 0 % (ref 0–1)
EOS ABS: 0.3 10*3/uL (ref 0.0–0.7)
EOS PCT: 4 % (ref 0–5)
HCT: 43.7 % (ref 39.0–52.0)
HEMOGLOBIN: 13.7 g/dL (ref 13.0–17.0)
Lymphocytes Relative: 16 % (ref 12–46)
Lymphs Abs: 1.1 10*3/uL (ref 0.7–4.0)
MCH: 31 pg (ref 26.0–34.0)
MCHC: 31.4 g/dL (ref 30.0–36.0)
MCV: 98.9 fL (ref 78.0–100.0)
MONOS PCT: 7 % (ref 3–12)
Monocytes Absolute: 0.5 10*3/uL (ref 0.1–1.0)
Neutro Abs: 5.1 10*3/uL (ref 1.7–7.7)
Neutrophils Relative %: 73 % (ref 43–77)
PLATELETS: 305 10*3/uL (ref 150–400)
RBC: 4.42 MIL/uL (ref 4.22–5.81)
RDW: 17.7 % — ABNORMAL HIGH (ref 11.5–15.5)
WBC: 7 10*3/uL (ref 4.0–10.5)

## 2014-05-05 LAB — BASIC METABOLIC PANEL
Anion gap: 21 — ABNORMAL HIGH (ref 5–15)
BUN: 64 mg/dL — ABNORMAL HIGH (ref 6–23)
CHLORIDE: 97 mmol/L (ref 96–112)
CO2: 22 mmol/L (ref 19–32)
CREATININE: 15.93 mg/dL — AB (ref 0.50–1.35)
Calcium: 7.7 mg/dL — ABNORMAL LOW (ref 8.4–10.5)
GFR calc Af Amer: 4 mL/min — ABNORMAL LOW (ref >90)
GFR, EST NON AFRICAN AMERICAN: 3 mL/min — AB (ref >90)
Glucose, Bld: 95 mg/dL (ref 70–99)
POTASSIUM: 5 mmol/L (ref 3.5–5.1)
Sodium: 140 mmol/L (ref 135–145)

## 2014-05-05 LAB — CBC
HCT: 42.6 % (ref 39.0–52.0)
Hemoglobin: 13.6 g/dL (ref 13.0–17.0)
MCH: 31.5 pg (ref 26.0–34.0)
MCHC: 31.9 g/dL (ref 30.0–36.0)
MCV: 98.6 fL (ref 78.0–100.0)
PLATELETS: 298 10*3/uL (ref 150–400)
RBC: 4.32 MIL/uL (ref 4.22–5.81)
RDW: 17.6 % — ABNORMAL HIGH (ref 11.5–15.5)
WBC: 7.7 10*3/uL (ref 4.0–10.5)

## 2014-05-05 LAB — COMPREHENSIVE METABOLIC PANEL
ALK PHOS: 119 U/L — AB (ref 39–117)
ALT: 30 U/L (ref 0–53)
AST: 20 U/L (ref 0–37)
Albumin: 3.7 g/dL (ref 3.5–5.2)
Anion gap: 17 — ABNORMAL HIGH (ref 5–15)
BUN: 65 mg/dL — ABNORMAL HIGH (ref 6–23)
CALCIUM: 7.8 mg/dL — AB (ref 8.4–10.5)
CO2: 27 mmol/L (ref 19–32)
Chloride: 94 mmol/L — ABNORMAL LOW (ref 96–112)
Creatinine, Ser: 15.7 mg/dL — ABNORMAL HIGH (ref 0.50–1.35)
GFR calc Af Amer: 4 mL/min — ABNORMAL LOW (ref >90)
GFR calc non Af Amer: 3 mL/min — ABNORMAL LOW (ref >90)
Glucose, Bld: 91 mg/dL (ref 70–99)
POTASSIUM: 4.7 mmol/L (ref 3.5–5.1)
SODIUM: 138 mmol/L (ref 135–145)
TOTAL PROTEIN: 7.9 g/dL (ref 6.0–8.3)
Total Bilirubin: 0.3 mg/dL (ref 0.3–1.2)

## 2014-05-05 SURGERY — GASTRECTOMY, SLEEVE, LAPAROSCOPIC
Anesthesia: General | Site: Abdomen

## 2014-05-05 MED ORDER — DEXAMETHASONE SODIUM PHOSPHATE 10 MG/ML IJ SOLN
INTRAMUSCULAR | Status: AC
  Filled 2014-05-05: qty 1

## 2014-05-05 MED ORDER — CISATRACURIUM BESYLATE 20 MG/10ML IV SOLN
INTRAVENOUS | Status: AC
  Filled 2014-05-05: qty 10

## 2014-05-05 MED ORDER — CALCITRIOL 0.5 MCG PO CAPS
0.7500 ug | ORAL_CAPSULE | Freq: Three times a day (TID) | ORAL | Status: DC
  Administered 2014-05-05 – 2014-05-07 (×5): 0.75 ug via ORAL
  Filled 2014-05-05 (×8): qty 1

## 2014-05-05 MED ORDER — HEPARIN SODIUM (PORCINE) 5000 UNIT/ML IJ SOLN
5000.0000 [IU] | Freq: Three times a day (TID) | INTRAMUSCULAR | Status: DC
  Administered 2014-05-05 – 2014-05-07 (×5): 5000 [IU] via SUBCUTANEOUS
  Filled 2014-05-05 (×8): qty 1

## 2014-05-05 MED ORDER — FENTANYL CITRATE (PF) 100 MCG/2ML IJ SOLN
25.0000 ug | INTRAMUSCULAR | Status: DC | PRN
  Administered 2014-05-05 (×2): 50 ug via INTRAVENOUS

## 2014-05-05 MED ORDER — ACETAMINOPHEN 160 MG/5ML PO SOLN
325.0000 mg | ORAL | Status: DC | PRN
Start: 2014-05-06 — End: 2014-05-07

## 2014-05-05 MED ORDER — SUCCINYLCHOLINE CHLORIDE 20 MG/ML IJ SOLN
INTRAMUSCULAR | Status: DC | PRN
  Administered 2014-05-05: 140 mg via INTRAVENOUS

## 2014-05-05 MED ORDER — PROPOFOL 10 MG/ML IV BOLUS
INTRAVENOUS | Status: AC
  Filled 2014-05-05: qty 20

## 2014-05-05 MED ORDER — CHLORHEXIDINE GLUCONATE CLOTH 2 % EX PADS: 6.0000 | MEDICATED_PAD | Freq: Once | CUTANEOUS | Status: DC

## 2014-05-05 MED ORDER — LIDOCAINE HCL (CARDIAC) 20 MG/ML IV SOLN
INTRAVENOUS | Status: DC | PRN
  Administered 2014-05-05: 75 mg via INTRAVENOUS

## 2014-05-05 MED ORDER — SODIUM CHLORIDE 0.9 % IV SOLN
2.0000 g | INTRAVENOUS | Status: DC | PRN
  Administered 2014-05-05: 2 g via INTRAVENOUS

## 2014-05-05 MED ORDER — GLYCOPYRROLATE 0.2 MG/ML IJ SOLN
INTRAMUSCULAR | Status: AC
  Filled 2014-05-05: qty 4

## 2014-05-05 MED ORDER — FENTANYL CITRATE (PF) 100 MCG/2ML IJ SOLN
INTRAMUSCULAR | Status: AC
  Filled 2014-05-05: qty 2

## 2014-05-05 MED ORDER — PROMETHAZINE HCL 25 MG/ML IJ SOLN: 6.2500 mg | INTRAMUSCULAR | Status: DC | PRN

## 2014-05-05 MED ORDER — DEXTROSE 5 % IV SOLN: 2.0000 g | INTRAVENOUS | Status: DC

## 2014-05-05 MED ORDER — UNJURY CHOCOLATE CLASSIC POWDER: 2.0000 [oz_av] | Freq: Four times a day (QID) | ORAL | Status: DC

## 2014-05-05 MED ORDER — LACTATED RINGERS IV SOLN: INTRAVENOUS | Status: DC

## 2014-05-05 MED ORDER — BUPIVACAINE LIPOSOME 1.3 % IJ SUSP
20.0000 mL | Freq: Once | INTRAMUSCULAR | Status: AC
  Administered 2014-05-05: 20 mL
  Filled 2014-05-05: qty 20

## 2014-05-05 MED ORDER — PHENYLEPHRINE HCL 10 MG/ML IJ SOLN
INTRAMUSCULAR | Status: AC
  Filled 2014-05-05: qty 1

## 2014-05-05 MED ORDER — UNJURY VANILLA POWDER
2.0000 [oz_av] | Freq: Four times a day (QID) | ORAL | Status: DC
  Administered 2014-05-07: 2 [oz_av] via ORAL

## 2014-05-05 MED ORDER — FENTANYL CITRATE (PF) 250 MCG/5ML IJ SOLN
INTRAMUSCULAR | Status: AC
  Filled 2014-05-05: qty 5

## 2014-05-05 MED ORDER — SODIUM CHLORIDE 0.9 % IJ SOLN
INTRAMUSCULAR | Status: AC
  Filled 2014-05-05: qty 10

## 2014-05-05 MED ORDER — HYDROMORPHONE HCL 1 MG/ML IJ SOLN
0.2500 mg | INTRAMUSCULAR | Status: DC | PRN
  Administered 2014-05-05 (×2): 0.5 mg via INTRAVENOUS

## 2014-05-05 MED ORDER — DEXTROSE 5 % IV SOLN
INTRAVENOUS | Status: AC
  Filled 2014-05-05: qty 2

## 2014-05-05 MED ORDER — ONDANSETRON HCL 4 MG/2ML IJ SOLN
INTRAMUSCULAR | Status: AC
  Filled 2014-05-05: qty 2

## 2014-05-05 MED ORDER — DEXTROSE-NACL 5-0.45 % IV SOLN
INTRAVENOUS | Status: DC
  Administered 2014-05-05: 16:00:00 via INTRAVENOUS
  Administered 2014-05-06: 75 mL/h via INTRAVENOUS

## 2014-05-05 MED ORDER — UNJURY CHICKEN SOUP POWDER: 2.0000 [oz_av] | Freq: Four times a day (QID) | ORAL | Status: DC

## 2014-05-05 MED ORDER — NEOSTIGMINE METHYLSULFATE 10 MG/10ML IV SOLN
INTRAVENOUS | Status: DC | PRN
  Administered 2014-05-05: 5 mg via INTRAVENOUS

## 2014-05-05 MED ORDER — LACTATED RINGERS IV SOLN
INTRAVENOUS | Status: DC
Start: 2014-05-05 — End: 2014-05-05

## 2014-05-05 MED ORDER — LIDOCAINE HCL (CARDIAC) 20 MG/ML IV SOLN
INTRAVENOUS | Status: AC
  Filled 2014-05-05: qty 5

## 2014-05-05 MED ORDER — ETOMIDATE 2 MG/ML IV SOLN
INTRAVENOUS | Status: DC | PRN
  Administered 2014-05-05: 10 mg via INTRAVENOUS

## 2014-05-05 MED ORDER — PROPOFOL 10 MG/ML IV BOLUS
INTRAVENOUS | Status: DC | PRN
  Administered 2014-05-05: 25 mg via INTRAVENOUS
  Administered 2014-05-05: 100 mg via INTRAVENOUS

## 2014-05-05 MED ORDER — ACETAMINOPHEN 160 MG/5ML PO SOLN: 650.0000 mg | ORAL | Status: DC | PRN

## 2014-05-05 MED ORDER — ONDANSETRON HCL 4 MG/2ML IJ SOLN
4.0000 mg | INTRAMUSCULAR | Status: DC | PRN
  Administered 2014-05-05 (×2): 4 mg via INTRAVENOUS
  Filled 2014-05-05 (×2): qty 2

## 2014-05-05 MED ORDER — FENTANYL CITRATE (PF) 100 MCG/2ML IJ SOLN
INTRAMUSCULAR | Status: DC | PRN
  Administered 2014-05-05 (×3): 50 ug via INTRAVENOUS

## 2014-05-05 MED ORDER — SODIUM CHLORIDE 0.9 % IV SOLN
10.0000 mg | INTRAVENOUS | Status: DC | PRN
  Administered 2014-05-05: 20 ug/min via INTRAVENOUS

## 2014-05-05 MED ORDER — SODIUM CHLORIDE 0.9 % IV SOLN
INTRAVENOUS | Status: DC
  Administered 2014-05-05 (×2): via INTRAVENOUS
  Administered 2014-05-05: 1000 mL via INTRAVENOUS

## 2014-05-05 MED ORDER — HEPARIN SODIUM (PORCINE) 5000 UNIT/ML IJ SOLN
5000.0000 [IU] | INTRAMUSCULAR | Status: AC
  Administered 2014-05-05: 5000 [IU] via SUBCUTANEOUS
  Filled 2014-05-05: qty 1

## 2014-05-05 MED ORDER — CISATRACURIUM BESYLATE (PF) 10 MG/5ML IV SOLN
INTRAVENOUS | Status: DC | PRN
  Administered 2014-05-05: 4 mg via INTRAVENOUS
  Administered 2014-05-05: 10 mg via INTRAVENOUS
  Administered 2014-05-05: 6 mg via INTRAVENOUS

## 2014-05-05 MED ORDER — LACTATED RINGERS IR SOLN
Status: DC | PRN
  Administered 2014-05-05: 1

## 2014-05-05 MED ORDER — NEOSTIGMINE METHYLSULFATE 10 MG/10ML IV SOLN
INTRAVENOUS | Status: AC
  Filled 2014-05-05: qty 1

## 2014-05-05 MED ORDER — EPHEDRINE SULFATE 50 MG/ML IJ SOLN
INTRAMUSCULAR | Status: AC
  Filled 2014-05-05: qty 1

## 2014-05-05 MED ORDER — GLYCOPYRROLATE 0.2 MG/ML IJ SOLN
INTRAMUSCULAR | Status: DC | PRN
  Administered 2014-05-05: .6 mg via INTRAVENOUS

## 2014-05-05 MED ORDER — OXYCODONE HCL 5 MG/5ML PO SOLN
5.0000 mg | ORAL | Status: DC | PRN
  Administered 2014-05-06 – 2014-05-07 (×4): 10 mg via ORAL
  Filled 2014-05-05 (×4): qty 10

## 2014-05-05 MED ORDER — MORPHINE SULFATE 2 MG/ML IJ SOLN
2.0000 mg | INTRAMUSCULAR | Status: DC | PRN
  Administered 2014-05-05 (×3): 4 mg via INTRAVENOUS
  Administered 2014-05-06: 2 mg via INTRAVENOUS
  Administered 2014-05-06: 4 mg via INTRAVENOUS
  Administered 2014-05-06: 2 mg via INTRAVENOUS
  Filled 2014-05-05: qty 2
  Filled 2014-05-05: qty 1
  Filled 2014-05-05: qty 2
  Filled 2014-05-05: qty 1
  Filled 2014-05-05 (×2): qty 2

## 2014-05-05 MED ORDER — HYDROMORPHONE HCL 1 MG/ML IJ SOLN
INTRAMUSCULAR | Status: AC
  Filled 2014-05-05: qty 1

## 2014-05-05 SURGICAL SUPPLY — 61 items
APPLICATOR COTTON TIP 6IN STRL (MISCELLANEOUS) IMPLANT
APPLIER CLIP 5 13 M/L LIGAMAX5 (MISCELLANEOUS)
APPLIER CLIP ROT 10 11.4 M/L (STAPLE)
APPLIER CLIP ROT 13.4 12 LRG (CLIP)
BLADE SURG 15 STRL LF DISP TIS (BLADE) ×1 IMPLANT
BLADE SURG 15 STRL SS (BLADE) ×2
CABLE HIGH FREQUENCY MONO STRZ (ELECTRODE) IMPLANT
CLIP APPLIE 5 13 M/L LIGAMAX5 (MISCELLANEOUS) IMPLANT
CLIP APPLIE ROT 10 11.4 M/L (STAPLE) IMPLANT
CLIP APPLIE ROT 13.4 12 LRG (CLIP) IMPLANT
DEVICE SUT QUICK LOAD TK 5 (STAPLE) IMPLANT
DEVICE SUT TI-KNOT TK 5X26 (MISCELLANEOUS) IMPLANT
DEVICE SUTURE ENDOST 10MM (ENDOMECHANICALS) IMPLANT
DEVICE TI KNOT TK5 (MISCELLANEOUS)
DEVICE TROCAR PUNCTURE CLOSURE (ENDOMECHANICALS) ×3 IMPLANT
DISSECTOR BLUNT TIP ENDO 5MM (MISCELLANEOUS) ×3 IMPLANT
DRAPE CAMERA CLOSED 9X96 (DRAPES) ×3 IMPLANT
ELECT REM PT RETURN 9FT ADLT (ELECTROSURGICAL) ×3
ELECTRODE REM PT RTRN 9FT ADLT (ELECTROSURGICAL) ×1 IMPLANT
GAUZE SPONGE 4X4 12PLY STRL (GAUZE/BANDAGES/DRESSINGS) IMPLANT
GLOVE BIOGEL M 8.0 STRL (GLOVE) ×3 IMPLANT
GOWN STRL REUS W/TWL XL LVL3 (GOWN DISPOSABLE) ×12 IMPLANT
HANDLE STAPLE EGIA 4 XL (STAPLE) ×3 IMPLANT
HOVERMATT SINGLE USE (MISCELLANEOUS) ×3 IMPLANT
KIT BASIN OR (CUSTOM PROCEDURE TRAY) ×3 IMPLANT
LIQUID BAND (GAUZE/BANDAGES/DRESSINGS) IMPLANT
NEEDLE SPNL 22GX3.5 QUINCKE BK (NEEDLE) ×3 IMPLANT
PACK UNIVERSAL I (CUSTOM PROCEDURE TRAY) ×3 IMPLANT
PEN SKIN MARKING BROAD (MISCELLANEOUS) ×3 IMPLANT
QUICK LOAD TK 5 (STAPLE)
RELOAD TRI 45 ART MED THCK BLK (STAPLE) ×3 IMPLANT
RELOAD TRI 45 ART MED THCK PUR (STAPLE) IMPLANT
RELOAD TRI 60 ART MED THCK BLK (STAPLE) ×18 IMPLANT
RELOAD TRI 60 ART MED THCK PUR (STAPLE) ×3 IMPLANT
SCISSORS LAP 5X45 EPIX DISP (ENDOMECHANICALS) IMPLANT
SCRUB PCMX 4 OZ (MISCELLANEOUS) ×6 IMPLANT
SEALANT SURGICAL APPL DUAL CAN (MISCELLANEOUS) IMPLANT
SET IRRIG TUBING LAPAROSCOPIC (IRRIGATION / IRRIGATOR) ×3 IMPLANT
SHEARS CURVED HARMONIC AC 45CM (MISCELLANEOUS) IMPLANT
SLEEVE ADV FIXATION 5X100MM (TROCAR) ×6 IMPLANT
SLEEVE GASTRECTOMY 36FR VISIGI (MISCELLANEOUS) ×3 IMPLANT
SOLUTION ANTI FOG 6CC (MISCELLANEOUS) ×3 IMPLANT
SPONGE LAP 18X18 X RAY DECT (DISPOSABLE) ×3 IMPLANT
STAPLER VISISTAT 35W (STAPLE) ×3 IMPLANT
SUT SURGIDAC NAB ES-9 0 48 120 (SUTURE) IMPLANT
SUT VIC AB 4-0 SH 18 (SUTURE) ×3 IMPLANT
SUT VICRYL 0 TIES 12 18 (SUTURE) ×3 IMPLANT
SYR 20CC LL (SYRINGE) ×3 IMPLANT
SYR 50ML LL SCALE MARK (SYRINGE) ×3 IMPLANT
TOWEL OR 17X26 10 PK STRL BLUE (TOWEL DISPOSABLE) ×6 IMPLANT
TOWEL OR NON WOVEN STRL DISP B (DISPOSABLE) ×3 IMPLANT
TRAY FOLEY W/METER SILVER 14FR (SET/KITS/TRAYS/PACK) ×3 IMPLANT
TROCAR ADV FIXATION 12X100MM (TROCAR) ×3 IMPLANT
TROCAR ADV FIXATION 5X100MM (TROCAR) ×3 IMPLANT
TROCAR BLADELESS 15MM (ENDOMECHANICALS) ×3 IMPLANT
TROCAR BLADELESS OPT 5 100 (ENDOMECHANICALS) ×3 IMPLANT
TUBE CALIBRATION LAPBAND (TUBING) IMPLANT
TUBING CONNECTING 10 (TUBING) ×2 IMPLANT
TUBING CONNECTING 10' (TUBING) ×1
TUBING ENDO SMARTCAP (MISCELLANEOUS) ×3 IMPLANT
TUBING FILTER THERMOFLATOR (ELECTROSURGICAL) ×3 IMPLANT

## 2014-05-05 NOTE — Transfer of Care (Signed)
Immediate Anesthesia Transfer of Care Note  Patient: Perry [name]  Procedure(s) Performed: Procedure(s): LAPAROSCOPIC GASTRIC SLEEVE RESECTION (N/A)  Patient Location: PACU  Anesthesia Type:General  Level of Consciousness: awake, alert , oriented and patient cooperative  Airway & Oxygen Therapy: Patient Spontanous Breathing and Patient connected to face mask oxygen  Post-op Assessment: Report given to RN, Post -op Vital signs reviewed and stable and Patient moving all extremities  Post vital signs: Reviewed and stable  Last Vitals:  Filed Vitals:   05/05/14 0829  BP: 108/55  Pulse: 102  Temp: 36.5 C  Resp: 16    Complications: No apparent anesthesia complications

## 2014-05-05 NOTE — Progress Notes (Signed)
Utilization review completed.  

## 2014-05-05 NOTE — Op Note (Signed)
Name:  Perry [name] MRN: [id number] Date of Surgery: 05/05/2014  Preop Diagnosis:  Morbid Obesity  Postop Diagnosis:  Morbid Obesity (Weight - 332, BMI - 53.6) , S/P Gastric Sleeve  Procedure:  Upper endoscopy  (Intraoperative)  Surgeon:  Ovidio Kinavid Joniel Graumann, M.D.  Anesthesia:  GET  Indications for procedure: Perry [name] is a 44 y.o. male whose [facility], [facility] and has completed a Gastric Sleeve today by Dr. Daphine DeutscherMartin.  I am doing an intraoperative upper endoscopy to evaluate the gastric pouch.  Operative Note: The patient is under general anesthesia.  Dr. Daphine DeutscherMartin is laparoscoping the patient while I do an upper endoscopy to evaluate the stomach pouch.  With the patient intubated, I passed the Pentax upper endoscope without difficulty down the esophagus.  The esophago-gastric junction was at 45 cm.    The mucosa of the stomach looked viable and the staple line was intact without bleeding.  I advanced to the pylorus, but did not go through it.  While I insufflated the stomach pouch with air, Dr. Daphine DeutscherMartin  flooded the upper abdomen with saline to put the gastric pouch under saline.  There was no bubbling or evidence of a leak.  Photos were taken of the gastric pouch.  There was no evidence of narrowing of the pouch and the gastric sleeve looked tubular.  The scope was then withdrawn.  The esophagus was unremarkable and the patient tolerated the endoscopy without difficulty.  Ovidio Kinavid Andreal Vultaggio, MD, [facility] Pager: [phone] Office phone:  [phone]

## 2014-05-05 NOTE — Anesthesia Procedure Notes (Signed)
Procedure Name: Intubation Date/Time: 05/05/2014 11:18 AM Performed by: Edison PaceGRAY, [place] Graefe E Pre-anesthesia Checklist: Patient identified, Timeout performed, Emergency Drugs available, Suction available and Patient being monitored Patient Re-evaluated:Patient Re-evaluated prior to inductionOxygen Delivery Method: Circle system utilized Preoxygenation: Pre-oxygenation with 100% oxygen Intubation Type: IV induction and Cricoid Pressure applied Ventilation: Mask ventilation without difficulty Laryngoscope Size: Mac and 4 Grade View: Grade II Tube type: Oral Tube size: 8.0 mm Number of attempts: 1 Airway Equipment and Method: Stylet Placement Confirmation: ETT inserted through vocal cords under direct vision,  positive ETCO2 and breath sounds checked- equal and bilateral Secured at: 21 cm Tube secured with: Tape Dental Injury: Teeth and Oropharynx as per pre-operative assessment

## 2014-05-05 NOTE — H&P (View-Only) (Signed)
Chief Complaint:  Obesity; Hemodialysis;  Sleeve gastrectomy to be candidate for transplant  History of Present Illness:  Perry [name] is an 44 y.o. male ready for sleeve gastrectomy.  Perry [name] is in today with formal former  [facility] patients who is helping him.  He is ready for sleeve gastrectomy.  They do his hemodialysis at home now which I was unaware that was offered.  He does complain of some problems with indigestion or heartburn but his upper GI showed no evidence of a hiatal hernia.  We will we'll plan to have hemodialysis the night before his surgery limp.  Would probably be good to go until Tuesday night or Wednesday morning after surgery.  They had no further questions.  There were the risks and benefits.  We'll proceed with sleeve gastrectomy.   Past Medical History  Diagnosis Date  . Dialysis patient   . Kidney failure   . High blood pressure   . Sleep apnea     Past Surgical History  Procedure Laterality Date  . Av fistula repair      Current Outpatient Prescriptions  Medication Sig Dispense Refill  . alprazolam (XANAX) 2 MG tablet TAKE ONE TABLET BY MOUTH AT BEDTIME AS NEEDED FOR SLEEP **MUST  LAST  30  DAYS  BETWEEN  REFILLS** 30 tablet 2  . calcitRIOL (ROCALTROL) 0.25 MCG capsule Take 0.75 mcg by mouth 3 (three) times daily with meals.     . cinacalcet (SENSIPAR) 60 MG tablet Take 120 mg by mouth every evening.     . diltiazem 2 % GEL Apply 1 application topically 3 (three) times daily. (Patient taking differently: Apply 1 application topically as directed. ) 30 g 3  . multivitamin (RENA-VIT) TABS tablet Take 1 tablet by mouth at bedtime.     . Oxycodone HCl 10 MG TABS Take 10 mg by mouth every 8 (eight) hours as needed (for pain).    [name]. sevelamer (RENVELA) 800 MG tablet Take 3,200 mg by mouth 3 (three) times daily with meals.      No current facility-administered medications for this visit.   Review of patient's allergies indicates no known allergies. Family  History  Problem Relation Age of Onset  . High blood pressure Mother    Social History:   reports that he quit smoking about 10 months ago. His smoking use included Cigarettes. He has a 20 pack-year smoking history. He has never used smokeless tobacco. He reports that he does not drink alcohol or use illicit drugs.   REVIEW OF SYSTEMS : Negative except for issues related to hemodiaylsis  Physical Exam:   There were no vitals taken for this visit. There is no weight on file to calculate BMI.  Gen:  WDWN AAM NAD  Neurological: Alert and oriented to person, place, and time. Motor and sensory function is grossly intact  Head: Normocephalic and atraumatic.  Eyes: Conjunctivae are normal. Pupils are equal, round, and reactive to light. No scleral icterus.  Neck: Normal range of motion. Neck supple. No tracheal deviation or thyromegaly present.  Cardiovascular:  SR without murmurs or gallops.  No carotid bruits Breast:  Not examined Respiratory: Effort normal.  No respiratory distress. No chest wall tenderness. Breath sounds normal.  No wheezes, rales or rhonchi.  Abdomen:  Obese, nontender GU:  Not examined Musculoskeletal: Normal range of motion. Extremities are nontender. No cyanosis, edema or clubbing noted Lymphadenopathy: No cervical, preauricular, postauricular or axillary adenopathy is present Skin: Skin is warm and dry.  No rash noted. No diaphoresis. No erythema. No pallor. Pscyh: Normal mood and affect. Behavior is normal. Judgment and thought content normal.   LABORATORY RESULTS: No results found for this or any previous visit (from the past 48 hour(s)).   RADIOLOGY RESULTS: No results found.  Problem List: Patient Active Problem List   Diagnosis Date Noted  . Epigastric abdominal pain   . Abdominal pain, epigastric 11/17/2013  . Hypotension 11/17/2013  . Abdominal pain 11/16/2013  . Seizures 06/06/2012  . ESRD on dialysis 06/06/2012  . Anemia of chronic disease  06/06/2012  . Secondary hyperparathyroidism (of renal origin) 06/06/2012  . HTN (hypertension) 06/06/2012  . 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      Matt B. Daphine DeutscherMartin, MD, [facility] CenterFACS  [facility] [phone] beeper [phone]  04/23/2014 11:30 AM

## 2014-05-05 NOTE — Op Note (Signed)
Surgeon: Perry LowMatt Janneth Krasner, MD, FACS  Asst:  Ovidio Kinavid Newman, MD, FACS  Anes:  General endotracheal  Procedure: Laparoscopic sleeve gastrectomy and upper endoscopy  Diagnosis: Morbid obesity  Complications: none  EBL:   30 cc  Description of Procedure:  The patient was take to OR 1 and given general anesthesia.  The abdomen was prepped with PCMX and draped sterilely.  A timeout was performed.  Access to the abdomen was achieved with a 5 mm Optiview through the left upper quadrant.  Standard trocar placement included 2 more 5 mm on the left and a 15 mm to the right of the midline and a 5 mm laterally.  5 mm in the upper midline for the [facility] retractor.  .  Following insufflation, the state of the abdomen was found to be free of adhesions.  The ViSiGi 36Fr tube was inserted to deflate the stomach and was pulled back into the esophagus.    The pylorus was identified and we measured 5 cm back and marked the antrum.  At that point we began dissection to take down the greater curvature of the stomach using the Harmonic scalpel. I used the [employer] adviserCovidien ultrasonic dissector but has to replace with the Ethicon Harmonic up near the spleen to take this area down.    This dissection was taken all the way up to the left crus.  Posterior attachments of the stomach were also taken down.    The ViSiGi tube was then passed into the antrum and suction applied so that it was snug along the lessor curvature.  The "crow's foot" or incisura was identified.  The sleeve gastrectomy was begun using the [employer] InternationalCovidien platform stapler beginning with a 4.5 mm black stapler with buttress followed by several firings of the 6 cm black with the built on buttress.  When the sleeve was complete the tube was taken off suction and insufflated briefly.  The tube was withdrawn.  Upper endoscopy was then performed by Dr. Ezzard StandingNewman.     The specimen was extracted through the 15 trocar site.  Wounds were infiltrated with Exparel and closed with 4-0  vicryl and Liquiban.  The 15 was closed with Endoclose and 0 vicryl.    Perry B. Daphine DeutscherMartin, MD, [facility], [place]PA [phone]

## 2014-05-05 NOTE — Interval H&P Note (Signed)
History and Physical Interval Note:  05/05/2014 10:58 AM  Perry [name]  has presented today for surgery, with the diagnosis of MORBID OBESITY  The various methods of treatment have been discussed with the patient and family. After consideration of risks, benefits and other options for treatment, the patient has consented to  Procedure(s): LAPAROSCOPIC GASTRIC SLEEVE RESECTION (N/A) as a surgical intervention .  The patient's history has been reviewed, patient examined, no change in status, stable for surgery.  I have reviewed the patient's chart and labs.  Questions were answered to the patient's satisfaction.     Lesli Issa B

## 2014-05-05 NOTE — Anesthesia Preprocedure Evaluation (Addendum)
Anesthesia Evaluation  Patient identified by MRN, date of birth, ID band Patient awake    Reviewed: Allergy & Precautions, NPO status , Patient's Chart, lab work & pertinent test results  Airway Mallampati: III  TM Distance: >3 FB Neck ROM: Full    Dental no notable dental hx.    Pulmonary sleep apnea , former smoker,  breath sounds clear to auscultation  Pulmonary exam normal       Cardiovascular hypertension, Pt. on medications negative cardio ROS  Rhythm:Regular Rate:Normal     Neuro/Psych Seizures - (single Sz in 2014. No treatment or recurrence),  negative psych ROS   GI/Hepatic negative GI ROS, Neg liver ROS,   Endo/Other  Morbid obesity  Renal/GU ESRF and DialysisRenal disease  negative genitourinary   Musculoskeletal negative musculoskeletal ROS (+)   Abdominal   Peds negative pediatric ROS (+)  Hematology negative hematology ROS (+)   Anesthesia Other Findings   Reproductive/Obstetrics negative OB ROS                            Anesthesia Physical Anesthesia Plan  ASA: IV  Anesthesia Plan: General   Post-op Pain Management:    Induction: Intravenous  Airway Management Planned: Oral ETT  Additional Equipment:   Intra-op Plan:   Post-operative Plan: Extubation in OR  Informed Consent: I have reviewed the patients History and Physical, chart, labs and discussed the procedure including the risks, benefits and alternatives for the proposed anesthesia with the patient or authorized representative who has indicated his/her understanding and acceptance.   Dental advisory given  Plan Discussed with: CRNA  Anesthesia Plan Comments:         Anesthesia Quick Evaluation

## 2014-05-06 ENCOUNTER — Inpatient Hospital Stay (HOSPITAL_COMMUNITY): Payer: Medicare Other

## 2014-05-06 ENCOUNTER — Encounter (HOSPITAL_COMMUNITY): Payer: Self-pay | Admitting: Surgery

## 2014-05-06 LAB — COMPREHENSIVE METABOLIC PANEL
ALK PHOS: 108 U/L (ref 39–117)
ALT: 46 U/L (ref 0–53)
AST: 28 U/L (ref 0–37)
Albumin: 3.6 g/dL (ref 3.5–5.2)
Anion gap: 19 — ABNORMAL HIGH (ref 5–15)
BILIRUBIN TOTAL: 0.4 mg/dL (ref 0.3–1.2)
BUN: 70 mg/dL — ABNORMAL HIGH (ref 6–23)
CO2: 23 mmol/L (ref 19–32)
CREATININE: 17.96 mg/dL — AB (ref 0.50–1.35)
Calcium: 8.2 mg/dL — ABNORMAL LOW (ref 8.4–10.5)
Chloride: 96 mmol/L (ref 96–112)
GFR calc Af Amer: 3 mL/min — ABNORMAL LOW (ref >90)
GFR, EST NON AFRICAN AMERICAN: 3 mL/min — AB (ref >90)
Glucose, Bld: 111 mg/dL — ABNORMAL HIGH (ref 70–99)
POTASSIUM: 6.3 mmol/L — AB (ref 3.5–5.1)
Sodium: 138 mmol/L (ref 135–145)
Total Protein: 7.3 g/dL (ref 6.0–8.3)

## 2014-05-06 LAB — CBC WITH DIFFERENTIAL/PLATELET
BASOS ABS: 0 10*3/uL (ref 0.0–0.1)
Basophils Relative: 0 % (ref 0–1)
EOS PCT: 2 % (ref 0–5)
Eosinophils Absolute: 0.1 10*3/uL (ref 0.0–0.7)
HEMATOCRIT: 43 % (ref 39.0–52.0)
HEMOGLOBIN: 13.3 g/dL (ref 13.0–17.0)
LYMPHS PCT: 12 % (ref 12–46)
Lymphs Abs: 1.1 10*3/uL (ref 0.7–4.0)
MCH: 30.8 pg (ref 26.0–34.0)
MCHC: 30.9 g/dL (ref 30.0–36.0)
MCV: 99.5 fL (ref 78.0–100.0)
Monocytes Absolute: 0.4 10*3/uL (ref 0.1–1.0)
Monocytes Relative: 5 % (ref 3–12)
NEUTROS ABS: 7 10*3/uL (ref 1.7–7.7)
NEUTROS PCT: 81 % — AB (ref 43–77)
PLATELETS: 316 10*3/uL (ref 150–400)
RBC: 4.32 MIL/uL (ref 4.22–5.81)
RDW: 17.7 % — ABNORMAL HIGH (ref 11.5–15.5)
WBC: 8.6 10*3/uL (ref 4.0–10.5)

## 2014-05-06 LAB — HEMOGLOBIN AND HEMATOCRIT, BLOOD
HCT: 41.9 % (ref 39.0–52.0)
HEMOGLOBIN: 13.3 g/dL (ref 13.0–17.0)

## 2014-05-06 LAB — BASIC METABOLIC PANEL
Anion gap: 21 — ABNORMAL HIGH (ref 5–15)
BUN: 77 mg/dL — ABNORMAL HIGH (ref 6–23)
CO2: 24 mmol/L (ref 19–32)
Calcium: 8.7 mg/dL (ref 8.4–10.5)
Chloride: 96 mmol/L (ref 96–112)
Creatinine, Ser: 19.04 mg/dL — ABNORMAL HIGH (ref 0.50–1.35)
GFR calc Af Amer: 3 mL/min — ABNORMAL LOW (ref >90)
GFR, EST NON AFRICAN AMERICAN: 3 mL/min — AB (ref >90)
GLUCOSE: 119 mg/dL — AB (ref 70–99)
Potassium: 5.7 mmol/L — ABNORMAL HIGH (ref 3.5–5.1)
Sodium: 141 mmol/L (ref 135–145)

## 2014-05-06 MED ORDER — IOHEXOL 300 MG/ML  SOLN
50.0000 mL | Freq: Once | INTRAMUSCULAR | Status: AC | PRN
  Administered 2014-05-06: 50 mL via ORAL

## 2014-05-06 MED ORDER — SODIUM POLYSTYRENE SULFONATE 15 GM/60ML PO SUSP
30.0000 g | Freq: Once | ORAL | Status: DC
  Filled 2014-05-06: qty 120

## 2014-05-06 MED ORDER — SODIUM POLYSTYRENE SULFONATE 15 GM/60ML PO SUSP
30.0000 g | Freq: Once | ORAL | Status: AC
  Administered 2014-05-06: 30 g via RECTAL
  Filled 2014-05-06: qty 120

## 2014-05-06 NOTE — Progress Notes (Signed)
Patient alert and oriented, Post op day 1.  Provided support and encouragement.  Encouraged pulmonary toilet, ambulation and small sips of liquids.  All questions answered.  Will continue to monitor. 

## 2014-05-06 NOTE — Anesthesia Postprocedure Evaluation (Signed)
  Anesthesia Post-op Note  Patient: Perry [name]  Procedure(s) Performed: Procedure(s) (LRB): LAPAROSCOPIC GASTRIC SLEEVE RESECTION (N/A)  Patient Location: PACU  Anesthesia Type: General  Level of Consciousness: awake and alert   Airway and Oxygen Therapy: Patient Spontanous Breathing  Post-op Pain: mild  Post-op Assessment: Post-op Vital signs reviewed, Patient's Cardiovascular Status Stable, Respiratory Function Stable, Patent Airway and No signs of Nausea or vomiting  Last Vitals:  Filed Vitals:   05/06/14 0556  BP: 135/91  Pulse: 113  Temp: 36.8 C  Resp: 18    Post-op Vital Signs: stable   Complications: No apparent anesthesia complications

## 2014-05-06 NOTE — Progress Notes (Signed)
RT set up patients CPAP for patient. Home setting is 19 cmH2O. Patient stated that he will self administer.

## 2014-05-06 NOTE — Progress Notes (Signed)
Spoke to Dr. Daphine DeutscherMartin re:  Potassium 5.7, Creatinine 19.04 after Kayexalate, Dr Daphine DeutscherMartin let me know that he had consulted renal MDs and would defer to them about Creatinine.  Orders received for Kayexalate if patient can tolerate

## 2014-05-06 NOTE — Care Management Note (Signed)
    Page 1 of 1   05/06/2014     11:54:53 AM CARE MANAGEMENT NOTE 05/06/2014  Patient:  Perry AllanROYSTER,Perry E   Account Number:  [id number]  Date Initiated:  05/06/2014  Documentation initiated by:  Lorenda IshiharaPEELE,Emrys Mckamie  Subjective/Objective Assessment:   44 yo male admitted s/p sleeve gastrectomy. PTA lived at home with spouse.     Action/Plan:   Discharge planning   Anticipated DC Date:  05/08/2014   Anticipated DC Plan:  HOME/SELF CARE      DC Planning Services  CM consult      Choice offered to / List presented to:             Status of service:  Completed, signed off Medicare Important Message given?   (If response is "NO", the following Medicare IM given date fields will be blank) Date Medicare IM given:   Medicare IM given by:   Date Additional Medicare IM given:   Additional Medicare IM given by:    Discharge Disposition:  HOME/SELF CARE  Per UR Regulation:  Reviewed for med. necessity/level of care/duration of stay  If discussed at Long Length of Stay Meetings, dates discussed:    Comments:

## 2014-05-06 NOTE — Progress Notes (Signed)
Courtesy visit.  Came by to see Perry [name] regarding possible need for IP dialysis due to high K+.  K down to 5.7, patient said he is " not doing inpatient dialysis".  He is a home HD patient and says he will resume HD at home when he is dc'd.  Brief exam w/o sig abnormalities.  Please call as needed.   Vinson Moselleob Bralynn Velador MD (pgr) [phone]    [phone] 05/06/2014, 4:31 PM

## 2014-05-06 NOTE — Progress Notes (Signed)
Day shift RT placed patient on CPAP. Night RT checked with patient to see if he needed anything, but patient was sleeping peacefully. RT will monitor as needed.

## 2014-05-06 NOTE — Progress Notes (Signed)
CRITICAL VALUE ALERT  Critical value received: potassium 6.3 Date of notification: 05/06/2014  Time of notification: 0645 Critical value read back:Yes.    Nurse who received alert: Doylene CanardM Michelina Mexicano RN MD notified (1st page):M MartinTime of first page:  0645 MD notified (2nd page):  Time of second page:  Responding MD:  Sheron NightingaleM Martin  Time MD responded:  [phone]

## 2014-05-06 NOTE — Progress Notes (Signed)
Patient ID: Perry [name], male   DOB: 12-12-1970, 44 y.o.   MRN: [id number] [facility] Surgery Progress Note:   1 Day Post-Op  Subjective: Mental status is clear.  Up walking.   Objective: Vital signs in last 24 hours: Temp:  [97.6 F (36.4 C)-99 F (37.2 C)] 98.6 F (37 C) (04/19 1000) Pulse Rate:  [86-126] 110 (04/19 1000) Resp:  [12-20] 18 (04/19 1000) BP: (106-135)/(62-91) 118/65 mmHg (04/19 1000) SpO2:  [90 %-100 %] 99 % (04/19 1000) Weight:  [145.151 kg (320 lb)] 145.151 kg (320 lb) (04/18 1626)  Intake/Output from previous day: 04/18 0701 - 04/19 0700 In: 1326.3 [I.V.:1326.3] Out: 0  Intake/Output this shift: Total I/O In: 60 [P.O.:60] Out: 0   Physical Exam: Work of breathing is normal.  Minimal abdominal pain.    Lab Results:  Results for orders placed or performed during the hospital encounter of 05/05/14 (from the past 48 hour(s))  CBC with Differential/Platelet     Status: Abnormal   Collection Time: 05/05/14  9:00 AM  Result Value Ref Range   WBC 7.0 4.0 - 10.5 K/uL   RBC 4.42 4.22 - 5.81 MIL/uL   Hemoglobin 13.7 13.0 - 17.0 g/dL   HCT 43.7 39.0 - 52.0 %   MCV 98.9 78.0 - 100.0 fL   MCH 31.0 26.0 - 34.0 pg   MCHC 31.4 30.0 - 36.0 g/dL   RDW 17.7 (H) 11.5 - 15.5 %   Platelets 305 150 - 400 K/uL   Neutrophils Relative % 73 43 - 77 %   Neutro Abs 5.1 1.7 - 7.7 K/uL   Lymphocytes Relative 16 12 - 46 %   Lymphs Abs 1.1 0.7 - 4.0 K/uL   Monocytes Relative 7 3 - 12 %   Monocytes Absolute 0.5 0.1 - 1.0 K/uL   Eosinophils Relative 4 0 - 5 %   Eosinophils Absolute 0.3 0.0 - 0.7 K/uL   Basophils Relative 0 0 - 1 %   Basophils Absolute 0.0 0.0 - 0.1 K/uL  Comprehensive metabolic panel     Status: Abnormal   Collection Time: 05/05/14  9:00 AM  Result Value Ref Range   Sodium 138 135 - 145 mmol/L   Potassium 4.7 3.5 - 5.1 mmol/L   Chloride 94 (L) 96 - 112 mmol/L   CO2 27 19 - 32 mmol/L   Glucose, Bld 91 70 - 99 mg/dL   BUN 65 (H) 6 - 23 mg/dL   Creatinine, Ser 15.70 (H) 0.50 - 1.35 mg/dL   Calcium 7.8 (L) 8.4 - 10.5 mg/dL   Total Protein 7.9 6.0 - 8.3 g/dL   Albumin 3.7 3.5 - 5.2 g/dL   AST 20 0 - 37 U/L   ALT 30 0 - 53 U/L   Alkaline Phosphatase 119 (H) 39 - 117 U/L   Total Bilirubin 0.3 0.3 - 1.2 mg/dL   GFR calc non Af Amer 3 (L) >90 mL/min   GFR calc Af Amer 4 (L) >90 mL/min    Comment: (NOTE) The eGFR has been calculated using the CKD EPI equation. This calculation has not been validated in all clinical situations. eGFR's persistently <90 mL/min signify possible Chronic Kidney Disease.    Anion gap 17 (H) 5 - 15  Basic metabolic panel     Status: Abnormal   Collection Time: 05/05/14  2:46 PM  Result Value Ref Range   Sodium 140 135 - 145 mmol/L    Comment: REPEATED TO VERIFY   Potassium 5.0  3.5 - 5.1 mmol/L    Comment: REPEATED TO VERIFY   Chloride 97 96 - 112 mmol/L    Comment: REPEATED TO VERIFY   CO2 22 19 - 32 mmol/L    Comment: REPEATED TO VERIFY   Glucose, Bld 95 70 - 99 mg/dL   BUN 64 (H) 6 - 23 mg/dL   Creatinine, Ser 15.93 (H) 0.50 - 1.35 mg/dL   Calcium 7.7 (L) 8.4 - 10.5 mg/dL    Comment: REPEATED TO VERIFY   GFR calc non Af Amer 3 (L) >90 mL/min   GFR calc Af Amer 4 (L) >90 mL/min    Comment: (NOTE) The eGFR has been calculated using the CKD EPI equation. This calculation has not been validated in all clinical situations. eGFR's persistently <90 mL/min signify possible Chronic Kidney Disease.    Anion gap 21 (H) 5 - 15    Comment: REPEATED TO VERIFY  CBC     Status: Abnormal   Collection Time: 05/05/14  2:46 PM  Result Value Ref Range   WBC 7.7 4.0 - 10.5 K/uL   RBC 4.32 4.22 - 5.81 MIL/uL   Hemoglobin 13.6 13.0 - 17.0 g/dL   HCT 42.6 39.0 - 52.0 %   MCV 98.6 78.0 - 100.0 fL   MCH 31.5 26.0 - 34.0 pg   MCHC 31.9 30.0 - 36.0 g/dL   RDW 17.6 (H) 11.5 - 15.5 %   Platelets 298 150 - 400 K/uL  CBC WITH DIFFERENTIAL     Status: Abnormal   Collection Time: 05/06/14  5:45 AM  Result Value  Ref Range   WBC 8.6 4.0 - 10.5 K/uL   RBC 4.32 4.22 - 5.81 MIL/uL   Hemoglobin 13.3 13.0 - 17.0 g/dL   HCT 43.0 39.0 - 52.0 %   MCV 99.5 78.0 - 100.0 fL   MCH 30.8 26.0 - 34.0 pg   MCHC 30.9 30.0 - 36.0 g/dL   RDW 17.7 (H) 11.5 - 15.5 %   Platelets 316 150 - 400 K/uL   Neutrophils Relative % 81 (H) 43 - 77 %   Neutro Abs 7.0 1.7 - 7.7 K/uL   Lymphocytes Relative 12 12 - 46 %   Lymphs Abs 1.1 0.7 - 4.0 K/uL   Monocytes Relative 5 3 - 12 %   Monocytes Absolute 0.4 0.1 - 1.0 K/uL   Eosinophils Relative 2 0 - 5 %   Eosinophils Absolute 0.1 0.0 - 0.7 K/uL   Basophils Relative 0 0 - 1 %   Basophils Absolute 0.0 0.0 - 0.1 K/uL  Comprehensive metabolic panel     Status: Abnormal   Collection Time: 05/06/14  5:45 AM  Result Value Ref Range   Sodium 138 135 - 145 mmol/L   Potassium 6.3 (HH) 3.5 - 5.1 mmol/L    Comment: DELTA CHECK NOTED REPEATED TO VERIFY SLIGHT HEMOLYSIS HEMOLYSIS AT THIS LEVEL MAY AFFECT RESULT CRITICAL RESULT CALLED TO, READ BACK BY AND VERIFIED WITH: DEBREW,M/5W @0635  ON 05/06/14 BY KARCZEWSKI,S.    Chloride 96 96 - 112 mmol/L   CO2 23 19 - 32 mmol/L   Glucose, Bld 111 (H) 70 - 99 mg/dL   BUN 70 (H) 6 - 23 mg/dL   Creatinine, Ser 17.96 (H) 0.50 - 1.35 mg/dL   Calcium 8.2 (L) 8.4 - 10.5 mg/dL   Total Protein 7.3 6.0 - 8.3 g/dL   Albumin 3.6 3.5 - 5.2 g/dL   AST 28 0 - 37 U/L   ALT 46 0 -  53 U/L   Alkaline Phosphatase 108 39 - 117 U/L   Total Bilirubin 0.4 0.3 - 1.2 mg/dL   GFR calc non Af Amer 3 (L) >90 mL/min   GFR calc Af Amer 3 (L) >90 mL/min    Comment: (NOTE) The eGFR has been calculated using the CKD EPI equation. This calculation has not been validated in all clinical situations. eGFR's persistently <90 mL/min signify possible Chronic Kidney Disease.    Anion gap 19 (H) 5 - 15    Radiology/Results: Dg Ugi W/water Sol Cm  05/06/2014   CLINICAL DATA:  Patient status post laparoscopic sleeve gastrectomy.  EXAM: WATER SOLUBLE UPPER GI SERIES   TECHNIQUE: Single-column upper GI series was performed using water soluble contrast.  CONTRAST:  19m OMNIPAQUE IOHEXOL 300 MG/ML  SOLN  COMPARISON:  Upper GI 03/25/2014  FLUOROSCOPY TIME:  Fluoroscopy Time (in minutes and seconds): 1 minutes 49 seconds  FINDINGS: Initial scout radiograph demonstrates minimal atelectasis left lung base. Nonobstructed bowel gas pattern.  Water-soluble contrast courses through the distal esophagus into the stomach. No evidence for gross leak. Contrast passes through the stomach into the duodenum and proximal small bowel without evidence for obstruction.  IMPRESSION: No evidence for gross leak status post sleeve gastrectomy.   Electronically Signed   By: DLovey NewcomerM.D.   On: 05/06/2014 11:45    Anti-infectives: Anti-infectives    Start     Dose/Rate Route Frequency Ordered Stop   05/05/14 0838  cefOXitin (MEFOXIN) 2 g in dextrose 5 % 50 mL IVPB  Status:  Discontinued     2 g 100 mL/hr over 30 Minutes Intravenous On call to O.R. 05/05/14 0838 05/05/14 1611      Assessment/Plan: Problem List: Patient Active Problem List   Diagnosis Date Noted  . 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 Epigastric abdominal pain   . Abdominal pain, epigastric 11/17/2013  . Hypotension 11/17/2013  . Abdominal pain 11/16/2013  . Seizures 06/06/2012  . ESRD on dialysis 06/06/2012  . Anemia of chronic disease 06/06/2012  . Secondary hyperparathyroidism (of renal origin) 06/06/2012  . HTN (hypertension) 06/06/2012  . Arteriovenous fistula occlusion 06/06/2012    UGI ok.  Noon labs pending. This is post Kayexelate enema.    Advance to PD 1 diet.   1 Day Post-Op    LOS: 1 day   Matt B. MHassell Done MD, FCalifornia [facility] [phone] 05/06/2014 12:16 PM

## 2014-05-07 DIAGNOSIS — Z4931 Encounter for adequacy testing for hemodialysis: Secondary | ICD-10-CM | POA: Diagnosis not present

## 2014-05-07 DIAGNOSIS — N186 End stage renal disease: Secondary | ICD-10-CM | POA: Diagnosis not present

## 2014-05-07 DIAGNOSIS — N2589 Other disorders resulting from impaired renal tubular function: Secondary | ICD-10-CM | POA: Diagnosis not present

## 2014-05-07 DIAGNOSIS — N2581 Secondary hyperparathyroidism of renal origin: Secondary | ICD-10-CM | POA: Diagnosis not present

## 2014-05-07 DIAGNOSIS — D509 Iron deficiency anemia, unspecified: Secondary | ICD-10-CM | POA: Diagnosis not present

## 2014-05-07 DIAGNOSIS — D631 Anemia in chronic kidney disease: Secondary | ICD-10-CM | POA: Diagnosis not present

## 2014-05-07 LAB — CBC WITH DIFFERENTIAL/PLATELET
BASOS ABS: 0 10*3/uL (ref 0.0–0.1)
Basophils Relative: 0 % (ref 0–1)
EOS ABS: 0.1 10*3/uL (ref 0.0–0.7)
Eosinophils Relative: 2 % (ref 0–5)
HCT: 41.4 % (ref 39.0–52.0)
Hemoglobin: 12.6 g/dL — ABNORMAL LOW (ref 13.0–17.0)
LYMPHS ABS: 0.9 10*3/uL (ref 0.7–4.0)
Lymphocytes Relative: 11 % — ABNORMAL LOW (ref 12–46)
MCH: 30.1 pg (ref 26.0–34.0)
MCHC: 30.4 g/dL (ref 30.0–36.0)
MCV: 98.8 fL (ref 78.0–100.0)
Monocytes Absolute: 0.8 10*3/uL (ref 0.1–1.0)
Monocytes Relative: 10 % (ref 3–12)
NEUTROS PCT: 77 % (ref 43–77)
Neutro Abs: 6.4 10*3/uL (ref 1.7–7.7)
PLATELETS: 303 10*3/uL (ref 150–400)
RBC: 4.19 MIL/uL — ABNORMAL LOW (ref 4.22–5.81)
RDW: 17.6 % — ABNORMAL HIGH (ref 11.5–15.5)
WBC: 8.2 10*3/uL (ref 4.0–10.5)

## 2014-05-07 NOTE — Discharge Summary (Signed)
Physician Discharge Summary  Patient ID: Perry [name] E Graff MRN: [id number] DOB/AGE: 09/08/1970 44 y.o.  Admit date: 05/05/2014 Discharge date: 05/07/2014  Admission Diagnoses:  ESRD and morbid obesity precluding renal transplant  Discharge Diagnoses:  same  Active Problems:   S/P laparoscopic sleeve gastrectomy   Surgery:  Laparoscopic sleeve gastrectomy  Discharged Condition: improved  Hospital Course:   Had surgery.  UGI on PD 1 looked good.  Diet begun and advanced.  Ready for discharge on PD 2 to go home for home HD.  Consults: nephrology  Significant Diagnostic Studies: UGI series    Discharge Exam: Blood pressure 109/55, pulse 112, temperature 99.4 F (37.4 C), temperature source Oral, resp. rate 18, height 5\' 6"  (1.676 m), weight 145.151 kg (320 lb), SpO2 90 %. Incisions OK.    Disposition: 01-Home or Self Care  Discharge Instructions    Ambulate hourly while awake    Complete by:  As directed      Call MD for:  difficulty breathing, headache or visual disturbances    Complete by:  As directed      Call MD for:  persistant dizziness or light-headedness    Complete by:  As directed      Call MD for:  persistant nausea and vomiting    Complete by:  As directed      Call MD for:  redness, tenderness, or signs of infection (pain, swelling, redness, odor or green/yellow discharge around incision site)    Complete by:  As directed      Call MD for:  severe uncontrolled pain    Complete by:  As directed      Call MD for:  temperature >101 F    Complete by:  As directed      Diet bariatric full liquid    Complete by:  As directed      Incentive spirometry    Complete by:  As directed   Perform hourly while awake            Medication List    TAKE these medications        alprazolam 2 MG tablet  Commonly known as:  XANAX  TAKE ONE TABLET BY MOUTH AT BEDTIME AS NEEDED FOR SLEEP **REFILLS  MUST  BE  AT  LEAST  30  DAYS  APART**     calcitRIOL 0.25 MCG capsule   Commonly known as:  ROCALTROL  Take 0.75 mcg by mouth 3 (three) times daily with meals.     cinacalcet 60 MG tablet  Commonly known as:  SENSIPAR  Take 120 mg by mouth every evening.     diltiazem 2 % Gel  Apply 1 application topically 3 (three) times daily.     multivitamin Tabs tablet  Take 1 tablet by mouth at bedtime.     Oxycodone HCl 10 MG Tabs  Take 10 mg by mouth every 8 (eight) hours as needed (for pain).     sevelamer carbonate 800 MG tablet  Commonly known as:  RENVELA  Take 3,200 mg by mouth 3 (three) times daily with meals.           Follow-up Information    Follow up with Valarie MerinoMARTIN,Secilia Apps B, MD. Go on 05/16/2014.   Specialty:  General Surgery   Why:  For Post-Op Check at 9:25 AM   Contact information:   [address] [place] [id number] [phone]       Signed: Valarie MerinoMARTIN,Briea Mcenery B 05/07/2014, 8:51 AM

## 2014-05-07 NOTE — Discharge Instructions (Signed)

## 2014-05-07 NOTE — Progress Notes (Signed)
Patient alert and oriented, pain is controlled. Patient is tolerating fluids,  Plan to advance to protein shake today.  Reviewed Gastric sleeve discharge instructions with patient and patient is able to articulate understanding.  Provided information on BELT program, Support Group and [facility] outpatient pharmacy. All questions answered, will continue to monitor.

## 2014-05-07 NOTE — Progress Notes (Signed)
Nurse reviewed discharge instructions with pt.  Pt verbalized understanding of discharge instructions, follow up appointments and new medication.  No concerns at time of discharge. 

## 2014-05-08 ENCOUNTER — Telehealth (HOSPITAL_COMMUNITY): Payer: Self-pay

## 2014-05-08 DIAGNOSIS — Z4931 Encounter for adequacy testing for hemodialysis: Secondary | ICD-10-CM | POA: Diagnosis not present

## 2014-05-08 DIAGNOSIS — N2581 Secondary hyperparathyroidism of renal origin: Secondary | ICD-10-CM | POA: Diagnosis not present

## 2014-05-08 DIAGNOSIS — D631 Anemia in chronic kidney disease: Secondary | ICD-10-CM | POA: Diagnosis not present

## 2014-05-08 DIAGNOSIS — D509 Iron deficiency anemia, unspecified: Secondary | ICD-10-CM | POA: Diagnosis not present

## 2014-05-08 DIAGNOSIS — N2589 Other disorders resulting from impaired renal tubular function: Secondary | ICD-10-CM | POA: Diagnosis not present

## 2014-05-08 DIAGNOSIS — N186 End stage renal disease: Secondary | ICD-10-CM | POA: Diagnosis not present

## 2014-05-08 NOTE — Telephone Encounter (Signed)
Made discharge phone call to patient per DROP protocol. Asking the following questions.    1. Do you have someone to care for you now that you are home?  yes 2. Are you having pain now that is not relieved by your pain medication?  no 3. Are you able to drink the recommended daily amount of fluids (48 ounces minimum/day) and protein (60-80 grams/day) as prescribed by the dietitian or nutritional counselor?  yes 4. Are you taking the vitamins and minerals as prescribed?  yes 5. Do you have the "on call" number to contact your surgeon if you have a problem or question?  yes 6. Are your incisions free of redness, swelling or drainage? (If steri strips, address that these can fall off, shower as tolerated) no 7. Have your bowels moved since your surgery?  If not, are you passing gas?  Yes. No 8. Are you up and walking 3-4 times per day?  yes    1. Do you have an appointment made to see your surgeon in the next month?  yes 2. Were you provided your discharge medications before your surgery or before you were discharged from the hospital and are you taking them without problem?  tes 3. Were you provided phone numbers to the clinic/surgeon's office?  yes 4. Did you watch the patient education video module in the (clinic, surgeon's office, etc.) before your surgery? yes 5. Do you have a discharge checklist that was provided to you in the hospital to reference with instructions on how to take care of yourself after surgery?  yes 6. Did you see a dietitian or nutritional counselor while you were in the hospital?  yes 7. Do you have an appointment to see a dietitian or nutritional counselor in the next month?  yes

## 2014-05-09 DIAGNOSIS — N2581 Secondary hyperparathyroidism of renal origin: Secondary | ICD-10-CM | POA: Diagnosis not present

## 2014-05-09 DIAGNOSIS — N2589 Other disorders resulting from impaired renal tubular function: Secondary | ICD-10-CM | POA: Diagnosis not present

## 2014-05-09 DIAGNOSIS — D509 Iron deficiency anemia, unspecified: Secondary | ICD-10-CM | POA: Diagnosis not present

## 2014-05-09 DIAGNOSIS — D631 Anemia in chronic kidney disease: Secondary | ICD-10-CM | POA: Diagnosis not present

## 2014-05-09 DIAGNOSIS — Z4931 Encounter for adequacy testing for hemodialysis: Secondary | ICD-10-CM | POA: Diagnosis not present

## 2014-05-09 DIAGNOSIS — N186 End stage renal disease: Secondary | ICD-10-CM | POA: Diagnosis not present

## 2014-05-09 NOTE — Op Note (Signed)
PATIENT NAME:  Perry [name], Perry [name] MR#:  [id number] DATE OF BIRTH:  December 27, 1970  DATE OF PROCEDURE:  06/18/2012  PREOPERATIVE DIAGNOSES: 1.  End-stage renal disease.  2.  Clotted left arm radiocephalic arteriovenous fistula.  3.  Hypertension.   POSTOPERATIVE DIAGNOSES: 1.  End-stage renal disease.  2.  Clotted left arm radiocephalic arteriovenous fistula.  3.  Hypertension.   PROCEDURES: 1.  Ultrasound guidance for vascular access to left radiocephalic AV fistula.  2.  Left upper extremity fistulogram.  3.  Catheter directed thrombolysis with 4 mg of TPA with the AngioJet AVX catheter.  4.  Mechanical rheolytic thrombectomy with AngioJet AVX catheter.  5.  Percutaneous transluminal angioplasty of forearm cephalic vein with 8 mm diameter angioplasty balloon.  6.  Covered stent placement x 2 for residual stenosis and thrombosis of multiple areas of forearm cephalic vein after above procedures.   SURGEON: Annice Needy, M.D.   ANESTHESIA: Local with moderate conscious sedation.   ESTIMATED BLOOD LOSS: Approximately 25 mL.   INDICATION FOR PROCEDURE: This is a 44 year old African American male with end-stage renal disease. His fistula has thrombosed. He is opened, over about 5 to 6 cm segment beyond anastomosis and draining through collaterals, but then it occludes throughout the remainder of the forearm. The outflow in the upper arm is mostly through the basilic vein. We are attempting to salvage this fistula.   DESCRIPTION OF PROCEDURE: The patient is brought to the vascular interventional radiology suite. The left upper extremity was sterilely prepped and draped and a sterile surgical field was created. The fistula was accessed just beyond the anastomosis in the cephalic vein with a micropuncture needle under direct ultrasound guidance and a permanent image was recorded. A micropuncture wire and sheath were placed and upsized to a 6 [place] sheath. Imaging showed again the first 5 to 6 cm to  be patent, which is consistent with what we had seen on our noninvasive study. It was then thrombosis through collaterals and drained mostly through the basilic vein in the upper arm.  I crossed the occlusion in the cephalic vein and gained access  to the cephalic vein in the upper arm. I then instilled 4 mg of TPA with the AngioJet AVX catheter due to the thrombosed portion of the cephalic vein. Mechanical rheolytic thrombectomy was also performed. Balloon angioplasty with an 8 mm diameter angioplasty balloon was done from the cephalic vein just above the antecubital fossa proximally, all the way back to just beyond the sheath. Multiple areas of waste were taken with this. A completion angiogram following this still showed residual thrombus and 2 areas of stenosis in the forearm cephalic vein. I exchanged for a 018 wire and treated these areas with an 8 mm diameter x 10 cm in length and an 8 mm diameter x 5 cm in length Viabahn-covered stent. This covered the lesions.  It stopped short of the antecubital fossa, so we would not harm the outflow through the basilic vein in the upper arm. The fistula was now clinically patent. At this point, I elected to terminate the procedure. The sheath was removed around a 4-0 Monocryl pursestring suture. Pressure was held. Sterile dressing was placed. The patient tolerated the procedure well and was taken to the recovery room in stable condition. It should be noted that postdilated, the Viabahn stents with a 9 mm angioplasty balloon.  ____________________________ Annice Needy, MD jsd:cc D: 06/18/2012 14:01:53 ET T: 06/18/2012 17:00:12 ET JOB#: [id number]  cc: Annice Needy,  MD, <Dictator> Annice NeedyJASON S DEW MD ELECTRONICALLY SIGNED 06/27/2012 10:57

## 2014-05-09 NOTE — Op Note (Signed)
PATIENT NAME:  Perry [name], Perry [name]  DATE OF PROCEDURE:  01/03/2013  PREOPERATIVE DIAGNOSES:  1.  End-stage renal disease.  2.  Hypertension.   POSTOPERATIVE DIAGNOSES:  1.  End-stage renal disease.  2.  Hypertension.   PROCEDURE: Right radiocephalic AV fistula creation.   SURGEON: Annice NeedyJason S Acelyn Basham, M.D.   ANESTHESIA: General.   ESTIMATED BLOOD LOSS: Approximately 25 mL.   INDICATION FOR PROCEDURE: A 44 year old African American male with end-stage renal disease. He is being brought in for permanent dialysis access after failure of his left arm fistula. Risks and benefits were discussed and informed consent obtained.   DESCRIPTION OF PROCEDURE: The patient is brought to the operative suite and, after an adequate level of general anesthesia was obtained, the right upper extremity was sterilely prepped and draped and a sterile surgical field was created.   An incision was created between the palpable radial artery in the typical course of the cephalic vein just above the wrist. I dissected out the cephalic vein which was a nice vein and usable for fistula creation. This was marked for orientation and 2 venous branches were ligated and divided between silk ties. The artery was then dissected out. This is a smaller artery but was not significantly diseased and had a good pulse. It was encircled with vessels proximally and distally. The patient was systemically heparinized. The vein was ligated and divided distally and then cut and beveled to appropriate length to match. An anterior wall arteriotomy was created with an 11-blade and extended with Potts scissors. An anastomosis  was created with a running 6-0 Prolene suture in the usual fashion. The vessel was flushed and de-aired prior to releasing control. On release, two 7-0 Prolene patch sutures were used for hemostasis and hemostasis was complete.   There was some significant vasospasm. It was treated with  topical papaverine which resulted in a nice thrill present within the AV fistula. At this point, the wound was closed. Surgicel was placed, then 3-0 Vicryl and 4-0 Monocryl were  used to close the incision. Dermabond was placed as a dressing. The patient tolerated the procedure well and was taken to the recovery room in stable condition.    ____________________________ Annice NeedyJason S. Holiday Mcmenamin, MD jsd:np D: 01/03/2013 16:41:59 ET T: 01/03/2013 17:13:44 ET JOB#: [id number]  cc: Annice NeedyJason S. Shaarav Ripple, MD, <Dictator> Annice NeedyJASON S Horald Birky MD ELECTRONICALLY SIGNED 01/07/2013 10:15

## 2014-05-09 NOTE — Op Note (Signed)
PATIENT NAME:  Perry [name], Yoav E MR#:  [id number] DATE OF BIRTH:  1970-02-08  DATE OF PROCEDURE:  05/25/2012  PREOPERATIVE DIAGNOSES:  1.  End-stage renal disease.  2.  Thrombosed left arm arteriovenous fistula.  3.  Hypertension.   POSTOPERATIVE DIAGNOSES:  1.  End-stage renal disease.  2.  Thrombosed left arm arteriovenous fistula.  3.  Hypertension.   PROCEDURE:   1.  Ultrasound guidance for vascular access to left arm AV fistula in both an antegrade and retrograde fashion. 2.  Left upper extremity fistulogram.  3.  Catheter directed thrombolysis with 4 mg of tPA with the AngioJet AVX catheter.  4.  Mechanical rheolytic thrombectomy with the AngioJet AVX catheter.  5.  Percutaneous transluminal angioplasty of perianastomotic stenosis.  6.  Percutaneous transluminal angioplasty of multiple areas of stenosis in the cephalic vein.   SURGEON: Annice NeedyJason S. Dew, M.D.   ANESTHESIA: Local with moderate conscious sedation.   ESTIMATED BLOOD LOSS: Approximately 25 mL.   INDICATION FOR PROCEDURE: A 44 year old [place] with end-stage renal disease. His left arm fistula is clotted and we are attempting to salvage this.   DESCRIPTION OF PROCEDURE: The patient was brought to the vascular and interventional radiology suite. The left upper extremity was sterilely prepped and draped and a sterile surgical field was created. The fistula was accessed in both an antegrade and retrograde fashion crossing due to the pulseless nature of the graft. Ultrasound was used and permanent image was recorded. Next, 6 French sheaths were placed and the patient was systemically heparinized. I was able to get a Magic Torque wire into the radial artery and the antegrade lesion. I was able to pass this up to the basilic vein, which was largely outflow in the upper arm. Next, 4 mg of tPA were delivered with the AngioJet AVX catheter and balloon angioplasty was performed with a 6 mm diameter angioplasty balloon  near the perianastomotic area. Further up in the mid forearm cephalic vein, a 10 mm diameter angioplasty balloon was later inflated. The tPA was allowed to dwell and mechanical rheolytic thrombectomy was performed to help clear the thrombus. Following this, there was some mild residual thrombosis left that did not appear flow-limiting. The angioplasties had resulted in improvement in the areas of stenosis and there was now palpable thrill within the AV fistula. At this point, I terminated the procedure. The sheaths were removed around 4-0 Monocryl purse-string sutures. Pressure was held and a sterile dressing was placed. The patient tolerated the procedure well and was taken to the recovery room in stable condition.   DATE OF PROCEDURE: Thank you   ____________________________ Annice NeedyJason S. Dew, MD jsd:jm D: 05/30/2012 11:01:52 ET T: 05/30/2012 13:55:36 ET JOB#: [id number]  cc: Annice NeedyJason S. Dew, MD, <Dictator> Annice NeedyJASON S DEW MD ELECTRONICALLY SIGNED 06/04/2012 13:53

## 2014-05-10 DIAGNOSIS — N186 End stage renal disease: Secondary | ICD-10-CM | POA: Diagnosis not present

## 2014-05-10 DIAGNOSIS — Z4931 Encounter for adequacy testing for hemodialysis: Secondary | ICD-10-CM | POA: Diagnosis not present

## 2014-05-10 DIAGNOSIS — N2581 Secondary hyperparathyroidism of renal origin: Secondary | ICD-10-CM | POA: Diagnosis not present

## 2014-05-10 DIAGNOSIS — D631 Anemia in chronic kidney disease: Secondary | ICD-10-CM | POA: Diagnosis not present

## 2014-05-10 DIAGNOSIS — D509 Iron deficiency anemia, unspecified: Secondary | ICD-10-CM | POA: Diagnosis not present

## 2014-05-10 DIAGNOSIS — N2589 Other disorders resulting from impaired renal tubular function: Secondary | ICD-10-CM | POA: Diagnosis not present

## 2014-05-10 NOTE — Op Note (Signed)
PATIENT NAME:  Perry [name], [name] MR#:  [id number] DATE OF BIRTH:  Apr 16, 1970  DATE OF PROCEDURE:  10/09/2013  PREOPERATIVE DIAGNOSES:  1.  End-stage renal disease.  2.  Poorly functioning right arm arteriovenous fistula.   POSTOPERATIVE DIAGNOSES:   1.  End-stage renal disease.  2.  Poorly functioning right arm arteriovenous fistula.   PROCEDURES:  1.  Ultrasound guidance for vascular access, right brachiocephalic AV fistula in a retrograde fashion in the cephalic vein.  2.  Right upper extremity fistulogram and central venogram.  3.  Percutaneous transluminal angioplasty of perianastomotic stenosis with 6-mm dilator Lutonix and 7-mm diameter conventional angioplasty balloon.   SURGEON: Aricka Goldberger S Capri Veals, MD.   ANESTHESIA: Local with moderate conscious sedation.   ESTIMATED BLOOD LOSS: Minimal.   FLUOROSCOPY TIME:  About 2 minutes.   CONTRAST USED: 30 mL.   INDICATION FOR PROCEDURE: A 44 year old African American male with end-stage renal disease. He had got a stenosis near his anastomosis of his brachiocephalic AV fistula. He does home dialysis and he and his wife reported lower flows and more difficulty with access over the past week or 2. He has had a previous intervention, a noninvasive study demonstrates recurrent stenosis at the perianastomotic area. He is brought in for angiography for further evaluation and potential treatment. Risks and benefits were discussed. Informed consent was obtained.   DESCRIPTION OF PROCEDURE: The patient is brought to the vascular suite. Right upper extremity sterilely prepped and draped and a sterile surgical field was created. In the mid upper arm the cephalic vein is accessed in a retrograde fashion under direct ultrasound guidance and a permanent image was recorded. A micropuncture wire and sheath were then placed and upsized to a 6 [place] sheath. The patient was given 4000 units of intravenous heparin for systemic anticoagulation and a Kumpe catheter was  placed into the brachial artery just across the anastomosis. Fistulogram was then performed. This demonstrated moderate stenosis in the perianastomotic region in the 60-70% range.   The remainder of the upper arm cephalic vein is patent. The previously intervened upon central venous portion appeared to have mild stenosis of 30% and this was not flow limiting with brisk flow seen.  A Magic Torque wire was placed across the lesion. I initially treated with a 6-mm diameter Lutonix drug-coated angioplasty balloon inflated for one 1 minute. I then inflated a 7-mm diameter conventional angioplasty balloon and both of these went just into the brachial artery encompassing the anastomosis and the initial portion of the cephalic vein. A Kumpe catheter was placed back into the brachial artery. Completion angiogram was then performed. This demonstrated improvement in flow with about a 20-30% residual stenosis which was not flow limiting. At this point, I elected to terminate the procedure. The sheath was removed, and 4-0 Monocryl pursestring suture was placed. Pressure was held. Sterile dressings were placed. The patient tolerated the procedure well and was taken to the recovery room in stable condition.    ____________________________ Annice NeedyJason S. Elianne Gubser, MD jsd:lt D: 10/09/2013 14:58:44 ET T: 10/09/2013 15:26:34 ET JOB#: [id number]  cc: Annice NeedyJason S. Fillmore Bynum, MD, <Dictator> Annice NeedyJASON S Illyana Schorsch MD ELECTRONICALLY SIGNED 10/29/2013 10:19

## 2014-05-10 NOTE — Op Note (Signed)
PATIENT NAME:  Perry [name], Perry [name] MR#:  [id number] DATE OF BIRTH:  [birth year]  DATE OF PROCEDURE:  05/14/2013  PREOPERATIVE DIAGNOSIS: End-stage renal disease with functional permanent dialysis access and no longer needing PermCath.   POSTOPERATIVE DIAGNOSIS: End-stage renal disease with functional permanent dialysis access and no longer needing PermCath.  [name].  PROCEDURE:  Removal of left jugular PermCath.   SURGEON:  Kidada Ging PA-C   ANESTHESIA:  Local.   ESTIMATED BLOOD LOSS:  Minimal.   INDICATION FOR THE PROCEDURE:  The patient is a 44 year old African American male with end-stage renal disease. His access is functional and he no longer needs his PermCath; therefore, this will be removed.   DESCRIPTION OF THE PROCEDURE:  The patient is brought to the vascular interventional radiology area and positioned supine. The left neck and chest and existing catheter were sterilely prepped and draped in a sterile surgical field was created. The area was locally anesthetized copiously with 1% lidocaine. Hemostats were used to help dissect out the cuff. An 11 blade was used to transect the fibrous sheath connected to the cuff. The catheter was then removed in its entirety without difficulty with gentle traction. Pressure was held at the base of the neck. Sterile dressing was placed. The patient tolerated the procedure well.    ____________________________ Hoyle Sauerhelsea N. Jakeira Seeman, PA-C cnh:dmm D: 05/14/2013 08:59:07 ET T: 05/14/2013 10:14:34 ET JOB#: [id number]  cc: Hoyle Sauerhelsea N. Aleana Fifita, PA-C, <Dictator> Omaira Mellen N Aarti Mankowski PA ELECTRONICALLY SIGNED 05/15/2013 9:12

## 2014-05-10 NOTE — Op Note (Signed)
PATIENT NAME:  Perry AllanROYSTER, Devron E MR#:  [id number] DATE OF BIRTH:  [birth year]  DATE OF PROCEDURE:  03/28/2013  PREOPERATIVE DIAGNOSES:  1. End-stage renal disease.  2. Nonfunctional right arm arteriovenous fistula.  3. Obesity.  4. Hypertension.   POSTOPERATIVE DIAGNOSES:  1. End-stage renal disease.  2. Nonfunctional right arm arteriovenous fistula.  3. Obesity.  4. Hypertension.   PROCEDURES:  1. Right brachiocephalic arteriovenous fistula creation.  2. Ligation of nonfunctional right radiocephalic arteriovenous fistula.   SURGEON: Annice NeedyJason S. Dew, M.D.   ASSISTANT: Hoyle Sauerhelsea N. Hanne, PA-C.   ANESTHESIA: General.   BLOOD LOSS: 25 mL.   INDICATION FOR PROCEDURE: This is a 44 year old gentleman with end-stage renal disease. He has had multiple previous dialysis access. We placed a right radiocephalic AV fistula last year. This did not mature and was not ever usable for dialysis, and we discussed options including percutaneous intervention to try to get the fistula functional. He desired to move the fistula more proximally to increase the likelihood of maturation and fistula usage. In doing this, we planned on ligating his nonfunctional right radiocephalic AV fistula and placing a brachiocephalic fistula. Risks and benefits were discussed. Informed consent was obtained.   DESCRIPTION OF PROCEDURE: The patient is brought to the operative suite, and after an adequate level of general anesthesia was obtained, the right upper extremity was sterilely prepped and draped, and a sterile surgical field was created. A curvilinear incision was created at the antecubital fossa, and we dissected out the cephalic vein which was reasonably large due to the fistula in the lower arm previously. The brachial artery was dissected out. This was smaller but not heavily diseased. The artery was encircled with vessel loops, and the vein was marked for orientation, ligated distally and divided and controlled with a  bulldog clamp. Intravenous heparin, 3500 units, was given for systemic anticoagulation. An anterior wall arteriotomy was created with an 11 blade and extended with Potts scissors, and the vein was cut and beveled to match the arteriotomy. I then created an anastomosis with a running 6-0 Prolene suture in the usual fashion. The vessel was flushed and de-aired prior to releasing control. On release, there was a nice soft thrill within the AV fistula. The wound was irrigated. Surgicel was placed. The wound was then closed with a running 3-0 Vicryl and a 4-0 Monocryl. I then created a transverse incision just above the previous incision in the distal forearm from his radiocephalic fistula creation. The cephalic vein was identified a few centimeters beyond the anastomosis and ligated with a 2-0 silk tie. This wound was then closed with a 3-0 Vicryl and a 4-0 Monocryl. Dermabond was placed as a dressing over both incisions. The patient was awakened from anesthesia and taken to the recovery room in stable condition, having tolerated the procedure well.     ____________________________ Annice NeedyJason S. Dew, MD jsd:gb D: 03/28/2013 17:12:02 ET T: 03/29/2013 06:14:13 ET JOB#: [id number]  cc: Annice NeedyJason S. Dew, MD, <Dictator> Annice NeedyJASON S DEW MD ELECTRONICALLY SIGNED 04/22/2013 9:41

## 2014-05-10 NOTE — Op Note (Signed)
PATIENT NAME:  Perry [name], Perry [name] MR#:  [id number] DATE OF BIRTH:  09-12-1970  DATE OF PROCEDURE:  09/09/2013  PREOPERATIVE DIAGNOSES:  1.  End-stage renal disease.  2.  Poorly functioning right arm arteriovenous fistula.   POSTOPERATIVE DIAGNOSES: 1.  End-stage renal disease.  2.  Poorly functioning right arm arteriovenous fistula.   PROCEDURES:  1.  Ultrasound guidance for vascular access x 2 to the right upper extremity arteriovenous fistula; 1 in the antegrade fashion for treatment, 1 in a retrograde fashion for treatment.  2.  Right upper extremity fistulogram and central venogram.  3.  Percutaneous transluminal angioplasty of perianastomotic stenosis with 5 mm diameter drug-coated angioplasty balloon and a 6 mm diameter conventional angioplasty balloon.  4.  Percutaneous transluminal angioplasty of right innominate stenosis with 10 mm diameter angioplasty balloon.   SURGEON: Annice NeedyJason S. Dew, M.D.   ANESTHESIA: Local with moderate conscious sedation.   ESTIMATED BLOOD LOSS: 25 mL   CONTRAST USED: 40 mL of Visipaque.   INDICATION FOR PROCEDURE: A 44 year old gentleman with end-stage renal disease, who I followed for his dialysis access. He does home dialysis, and he had very poor flows on his dialysis this morning. He called our office, and we scheduled him for a fistulogram today.   DESCRIPTION OF PROCEDURE: The patient was brought to the vascular suit. The right upper extremity was sterilely prepped and draped, and a sterile surgical field was created. We initially accessed the mid upper arm cephalic vein in a retrograde fashion under direct ultrasound guidance in an area that was large and patent. This was accessed without difficulty with a micropuncture needle under direct ultrasound guidance and a permanent image was recorded. A micropuncture wire and sheath were then placed, and a Kumpe catheter was placed into the brachial artery. Imaging was then performed. This demonstrated a  moderate stenosis in the 70% range in the perianastomotic region, just beyond the actual anastomosis, less than 1 cm into the cephalic vein. The rest of the upper arm cephalic vein was patent. The subclavian vein was patent; however, in the innominate vein, there was a significant narrowing in the 75-80% range with a large amount of collaterals present. This was likely from a previous catheter in this location. I crossed the perianastomotic stenosis, and heparinized the patient with 3000 units of intravenous heparin. A 5 mm diameter drug-coated angioplasty balloon and then a 6 mm diameter conventional angioplasty balloon were inflated across the anastomosis from the brachial artery into the cephalic vein just beyond the anastomosis. Narrowing was seen with both inflations resolved with the angioplasty, and a completion angiogram with Kumpe catheter parked back at the anastomosis showed this to be markedly improved, with only about a 20% residual stenosis. This sheath was removed and a 4-0 Monocryl pursestring suture was placed.   An antegrade sheath was then placed under direct ultrasound guidance, only about 5 cm proximal to the antecubital fossa. This was done under direct ultrasound guidance without difficulty with a micropuncture needle and a permanent image was recorded. The micropuncture wire and sheath were placed and upsized to a 6 [place] sheath. The stenosis was crossed with a mild amount of difficulty with a Kumpe catheter and a glide wire. We then exchanged for a Magic torque wire, after parking the catheter in the vena cava. A 10 mm diameter x 6 cm in length angioplasty balloon was inflated in the innominate vein with a narrowing seen that resolved at about 8 to 10 atmospheres. The balloon was then  deflated and a completion angiogram was then performed with markedly improved flow, with no significant collaterals seen and brisk flow through the innominate vein without significant stenosis.   At this  point, I elected to terminate the procedure. The sheath was removed. A 4-0 Monocryl pursestring sutures placed. Pressure was held. Sterile dressing was placed.   The patient tolerated the procedure well and was taken to the recovery room in stable condition.     ____________________________ Annice Needy, MD jsd:MT D: 09/09/2013 16:16:08 ET T: 09/10/2013 05:18:46 ET JOB#: [id number]  cc: Annice Needy, MD, <Dictator> Annice Needy MD ELECTRONICALLY SIGNED 09/11/2013 10:34

## 2014-05-11 NOTE — Op Note (Signed)
PATIENT NAME:  Dana AllanROYSTER, Paschal E MR#:  [id number] DATE OF BIRTH:  1970-11-20  DATE OF PROCEDURE:  04/28/2011  PREOPERATIVE DIAGNOSES:  1. End-stage renal disease. 2. Poorly functioning left arm AV fistula.  3. Hypertension.  POSTOPERATIVE DIAGNOSES:  1. End-stage renal disease. 2. Poorly functioning left arm AV fistula.  3. Hypertension.  PROCEDURES PERFORMED: 1. Ultrasound guidance for vascular access to left arm AV fistula.  2. Left upper extremity fistulogram.  3. Percutaneous transluminal angioplasty of mid forearm cephalic vein stenosis with 7 mm diameter angioplasty balloon.  4. Percutaneous transluminal angioplasty of perianastomotic stenosis with 6 and 7 mm diameter angioplasty balloons.   SURGEON: Annice NeedyJason S. Dew, M.D.   ANESTHESIA: Local with moderate conscious sedation.  ESTIMATED BLOOD LOSS: Approximately 25 mL.   INDICATION FOR PROCEDURE: This is a 44 year old African American male with end-stage renal disease. He had noninvasive studies showing a very tight stenosis just beyond his anastomosis within his AV fistula and for fistula salvage he is brought in for a fistulogram with possible intervention. The risks and benefits were discussed and informed consent was obtained.   DESCRIPTION OF PROCEDURE: The patient is brought to the vascular interventional radiology suite. The left upper extremity is sterilely prepped and draped and a sterile surgical field is created. Ultrasound is used to access the cephalic vein just below the antecubital fossa, in a retrograde fashion, and a fistulogram was performed with the help of a Kumpe catheter which was placed to the anastomosis through the radial artery. This demonstrated a very tight stenosis in the 80 to 90% range. Just beyond the anastomosis a centimeter or so there were also a fair number of branches. The patient was given a small dose of intravenous heparin and a Magic torque wire was placed into the radial artery and this lesion was  treated with a 6 mm diameter conventional, a 6 mm diameter high-pressure, and then a 7 mm diameter conventional angioplasty balloon with good angiographic result after the final angioplasty balloon with only an approximately 20% residual stenosis. Following this there was an area in the mid cephalic vein that also appeared to be reasonably tightly stenotic. This may have been spasm and so we gave 250 mcg of topical nitroglycerin  with no improvement and so I elected to treat this with a 7 mm diameter angioplasty balloon. There was some waste which was taken that resolved. Following angioplasty there did appear to be some residual spasm in the area, but the flow was improved. I gave an additional 250 mcg of nitroglycerin. This was a 7 mm diameter angioplasty balloon, in the mid forearm cephalic vein. At this point, the sheath was removed around a 4-0 Monocryl pursestring suture, pressure was held, and a sterile dressing was placed. The patient tolerated the procedure well and was taken to the recovery room in stable condition. ____________________________ Annice NeedyJason S. Dew, MD jsd:slb D: 04/28/2011 10:25:15 ET T: 04/28/2011 11:46:53 ET JOB#: [id number]  cc: Annice NeedyJason S. Dew, MD, <Dictator> Annice NeedyJASON S DEW MD ELECTRONICALLY SIGNED 04/28/2011 12:02

## 2014-05-13 DIAGNOSIS — N2581 Secondary hyperparathyroidism of renal origin: Secondary | ICD-10-CM | POA: Diagnosis not present

## 2014-05-13 DIAGNOSIS — N2589 Other disorders resulting from impaired renal tubular function: Secondary | ICD-10-CM | POA: Diagnosis not present

## 2014-05-13 DIAGNOSIS — Z4931 Encounter for adequacy testing for hemodialysis: Secondary | ICD-10-CM | POA: Diagnosis not present

## 2014-05-13 DIAGNOSIS — N186 End stage renal disease: Secondary | ICD-10-CM | POA: Diagnosis not present

## 2014-05-13 DIAGNOSIS — D509 Iron deficiency anemia, unspecified: Secondary | ICD-10-CM | POA: Diagnosis not present

## 2014-05-13 DIAGNOSIS — D631 Anemia in chronic kidney disease: Secondary | ICD-10-CM | POA: Diagnosis not present

## 2014-05-14 ENCOUNTER — Inpatient Hospital Stay
Admit: 2014-05-14 | Disposition: A | Discharge status: Home / Self Care | Payer: Self-pay | Attending: Internal Medicine | Admitting: Internal Medicine

## 2014-05-14 DIAGNOSIS — Z9884 Bariatric surgery status: Secondary | ICD-10-CM | POA: Diagnosis not present

## 2014-05-14 DIAGNOSIS — E875 Hyperkalemia: Secondary | ICD-10-CM | POA: Diagnosis not present

## 2014-05-14 DIAGNOSIS — Z4901 Encounter for fitting and adjustment of extracorporeal dialysis catheter: Secondary | ICD-10-CM | POA: Diagnosis not present

## 2014-05-14 DIAGNOSIS — N2589 Other disorders resulting from impaired renal tubular function: Secondary | ICD-10-CM | POA: Diagnosis not present

## 2014-05-14 DIAGNOSIS — N186 End stage renal disease: Secondary | ICD-10-CM | POA: Diagnosis present

## 2014-05-14 DIAGNOSIS — Z8673 Personal history of transient ischemic attack (TIA), and cerebral infarction without residual deficits: Secondary | ICD-10-CM | POA: Diagnosis not present

## 2014-05-14 DIAGNOSIS — N179 Acute kidney failure, unspecified: Secondary | ICD-10-CM | POA: Diagnosis present

## 2014-05-14 DIAGNOSIS — T82868A Thrombosis of vascular prosthetic devices, implants and grafts, initial encounter: Secondary | ICD-10-CM | POA: Diagnosis present

## 2014-05-14 DIAGNOSIS — Z6841 Body Mass Index (BMI) 40.0 and over, adult: Secondary | ICD-10-CM | POA: Diagnosis not present

## 2014-05-14 DIAGNOSIS — D509 Iron deficiency anemia, unspecified: Secondary | ICD-10-CM | POA: Diagnosis not present

## 2014-05-14 DIAGNOSIS — E872 Acidosis: Secondary | ICD-10-CM | POA: Diagnosis not present

## 2014-05-14 DIAGNOSIS — T82858A Stenosis of vascular prosthetic devices, implants and grafts, initial encounter: Secondary | ICD-10-CM | POA: Diagnosis not present

## 2014-05-14 DIAGNOSIS — E785 Hyperlipidemia, unspecified: Secondary | ICD-10-CM | POA: Diagnosis present

## 2014-05-14 DIAGNOSIS — N2581 Secondary hyperparathyroidism of renal origin: Secondary | ICD-10-CM | POA: Diagnosis present

## 2014-05-14 DIAGNOSIS — M1990 Unspecified osteoarthritis, unspecified site: Secondary | ICD-10-CM | POA: Diagnosis present

## 2014-05-14 DIAGNOSIS — Z8249 Family history of ischemic heart disease and other diseases of the circulatory system: Secondary | ICD-10-CM | POA: Diagnosis not present

## 2014-05-14 DIAGNOSIS — D631 Anemia in chronic kidney disease: Secondary | ICD-10-CM | POA: Diagnosis present

## 2014-05-14 DIAGNOSIS — Z4931 Encounter for adequacy testing for hemodialysis: Secondary | ICD-10-CM | POA: Diagnosis not present

## 2014-05-14 DIAGNOSIS — T82510S Breakdown (mechanical) of surgically created arteriovenous fistula, sequela: Secondary | ICD-10-CM | POA: Diagnosis not present

## 2014-05-14 DIAGNOSIS — Z79899 Other long term (current) drug therapy: Secondary | ICD-10-CM | POA: Diagnosis not present

## 2014-05-14 DIAGNOSIS — G4733 Obstructive sleep apnea (adult) (pediatric): Secondary | ICD-10-CM | POA: Diagnosis present

## 2014-05-14 DIAGNOSIS — G9341 Metabolic encephalopathy: Secondary | ICD-10-CM | POA: Diagnosis not present

## 2014-05-14 DIAGNOSIS — Z992 Dependence on renal dialysis: Secondary | ICD-10-CM | POA: Diagnosis not present

## 2014-05-14 DIAGNOSIS — I120 Hypertensive chronic kidney disease with stage 5 chronic kidney disease or end stage renal disease: Secondary | ICD-10-CM | POA: Diagnosis present

## 2014-05-14 DIAGNOSIS — I959 Hypotension, unspecified: Secondary | ICD-10-CM | POA: Diagnosis not present

## 2014-05-14 LAB — BASIC METABOLIC PANEL
Anion Gap: 29 — ABNORMAL HIGH (ref 7–16)
BUN: 134 mg/dL — AB
CHLORIDE: 88 mmol/L — AB
Calcium, Total: 8.2 mg/dL — ABNORMAL LOW
Co2: 14 mmol/L — ABNORMAL LOW
Creatinine: 26.24 mg/dL — ABNORMAL HIGH
EGFR (African American): 2 — ABNORMAL LOW
EGFR (Non-African Amer.): 2 — ABNORMAL LOW
GLUCOSE: 123 mg/dL — AB
Potassium: >7.5 mmol/L
Sodium: 131 mmol/L — ABNORMAL LOW

## 2014-05-14 LAB — CBC WITH DIFFERENTIAL/PLATELET
BASOS ABS: 0 10*3/uL (ref 0.0–0.1)
Basophil %: 0.1 %
EOS ABS: 0.1 10*3/uL (ref 0.0–0.7)
Eosinophil %: 0.3 %
HCT: 44.8 % (ref 40.0–52.0)
HGB: 14.4 g/dL (ref 13.0–18.0)
LYMPHS PCT: 2.6 %
Lymphocyte #: 0.4 10*3/uL — ABNORMAL LOW (ref 1.0–3.6)
MCH: 30.6 pg (ref 26.0–34.0)
MCHC: 32.2 g/dL (ref 32.0–36.0)
MCV: 95 fL (ref 80–100)
MONOS PCT: 11.1 %
Monocyte #: 1.9 x10 3/mm — ABNORMAL HIGH (ref 0.2–1.0)
NEUTROS ABS: 14.8 10*3/uL — AB (ref 1.4–6.5)
NEUTROS PCT: 85.9 %
Platelet: 515 10*3/uL — ABNORMAL HIGH (ref 150–440)
RBC: 4.73 10*6/uL (ref 4.40–5.90)
RDW: 18 % — AB (ref 11.5–14.5)
WBC: 17.6 10*3/uL — AB (ref 3.8–10.6)

## 2014-05-14 LAB — POTASSIUM
POTASSIUM: 3.8 mmol/L
Potassium: 4.3 mmol/L

## 2014-05-14 LAB — PHOSPHORUS: Phosphorus: 13.1 mg/dL — ABNORMAL HIGH

## 2014-05-15 LAB — CBC WITH DIFFERENTIAL/PLATELET
Basophil #: 0 10*3/uL (ref 0.0–0.1)
Basophil %: 0.2 %
EOS PCT: 0.2 %
Eosinophil #: 0 10*3/uL (ref 0.0–0.7)
HCT: 43.6 % (ref 40.0–52.0)
HGB: 13.7 g/dL (ref 13.0–18.0)
LYMPHS ABS: 0.3 10*3/uL — AB (ref 1.0–3.6)
LYMPHS PCT: 2.4 %
MCH: 29.9 pg (ref 26.0–34.0)
MCHC: 31.5 g/dL — ABNORMAL LOW (ref 32.0–36.0)
MCV: 95 fL (ref 80–100)
MONOS PCT: 11.7 %
Monocyte #: 1.7 x10 3/mm — ABNORMAL HIGH (ref 0.2–1.0)
NEUTROS ABS: 12.2 10*3/uL — AB (ref 1.4–6.5)
Neutrophil %: 85.5 %
Platelet: 461 10*3/uL — ABNORMAL HIGH (ref 150–440)
RBC: 4.59 10*6/uL (ref 4.40–5.90)
RDW: 19 % — ABNORMAL HIGH (ref 11.5–14.5)
WBC: 14.2 10*3/uL — ABNORMAL HIGH (ref 3.8–10.6)

## 2014-05-15 LAB — BASIC METABOLIC PANEL
Anion Gap: 22 — ABNORMAL HIGH (ref 7–16)
BUN: 77 mg/dL — AB
CO2: 21 mmol/L — AB
Calcium, Total: 8.6 mg/dL — ABNORMAL LOW
Chloride: 92 mmol/L — ABNORMAL LOW
Creatinine: 18.38 mg/dL — ABNORMAL HIGH
EGFR (Non-African Amer.): 3 — ABNORMAL LOW
GFR CALC AF AMER: 3 — AB
Glucose: 97 mg/dL
Potassium: 5.9 mmol/L — ABNORMAL HIGH
Sodium: 135 mmol/L

## 2014-05-15 LAB — PHOSPHORUS: PHOSPHORUS: 11.3 mg/dL — AB

## 2014-05-16 DIAGNOSIS — N186 End stage renal disease: Secondary | ICD-10-CM | POA: Diagnosis not present

## 2014-05-16 DIAGNOSIS — N2589 Other disorders resulting from impaired renal tubular function: Secondary | ICD-10-CM | POA: Diagnosis not present

## 2014-05-16 DIAGNOSIS — D509 Iron deficiency anemia, unspecified: Secondary | ICD-10-CM | POA: Diagnosis not present

## 2014-05-16 DIAGNOSIS — Z4931 Encounter for adequacy testing for hemodialysis: Secondary | ICD-10-CM | POA: Diagnosis not present

## 2014-05-16 DIAGNOSIS — T82510S Breakdown (mechanical) of surgically created arteriovenous fistula, sequela: Secondary | ICD-10-CM | POA: Diagnosis not present

## 2014-05-16 DIAGNOSIS — D631 Anemia in chronic kidney disease: Secondary | ICD-10-CM | POA: Diagnosis not present

## 2014-05-16 DIAGNOSIS — N2581 Secondary hyperparathyroidism of renal origin: Secondary | ICD-10-CM | POA: Diagnosis not present

## 2014-05-17 DIAGNOSIS — D509 Iron deficiency anemia, unspecified: Secondary | ICD-10-CM | POA: Diagnosis not present

## 2014-05-17 DIAGNOSIS — Z4931 Encounter for adequacy testing for hemodialysis: Secondary | ICD-10-CM | POA: Diagnosis not present

## 2014-05-17 DIAGNOSIS — N2589 Other disorders resulting from impaired renal tubular function: Secondary | ICD-10-CM | POA: Diagnosis not present

## 2014-05-17 DIAGNOSIS — D631 Anemia in chronic kidney disease: Secondary | ICD-10-CM | POA: Diagnosis not present

## 2014-05-17 DIAGNOSIS — N2581 Secondary hyperparathyroidism of renal origin: Secondary | ICD-10-CM | POA: Diagnosis not present

## 2014-05-17 DIAGNOSIS — N186 End stage renal disease: Secondary | ICD-10-CM | POA: Diagnosis not present

## 2014-05-18 DIAGNOSIS — E46 Unspecified protein-calorie malnutrition: Secondary | ICD-10-CM | POA: Diagnosis not present

## 2014-05-18 DIAGNOSIS — D689 Coagulation defect, unspecified: Secondary | ICD-10-CM | POA: Diagnosis not present

## 2014-05-18 DIAGNOSIS — N186 End stage renal disease: Secondary | ICD-10-CM | POA: Diagnosis not present

## 2014-05-18 DIAGNOSIS — D631 Anemia in chronic kidney disease: Secondary | ICD-10-CM | POA: Diagnosis not present

## 2014-05-18 DIAGNOSIS — Z4931 Encounter for adequacy testing for hemodialysis: Secondary | ICD-10-CM | POA: Diagnosis not present

## 2014-05-18 NOTE — Op Note (Signed)
PATIENT NAME:  Perry [name], Perry [name] MR#:  [id number] DATE OF BIRTH:  [birth year]  DATE OF PROCEDURE:  05/14/2014  PREOPERATIVE DIAGNOSES:  1.  End-stage renal disease.  2.  Severe hyperkalemia.  3.  Clotted right arm arteriovenous fistula.  4.  Acidosis.   POSTOPERATIVE DIAGNOSES:  1.  End-stage renal disease.  2.  Severe hyperkalemia.  3.  Clotted right arm arteriovenous fistula.  4.  Acidosis.   PROCEDURE:  1.  Ultrasound guidance for vascular access to right femoral vein.  2.  Placement of right femoral 30 cm long Trialysis type dialysis catheter.   SURGEON: Annice NeedyJason S. Dew, MD   ANESTHESIA: Local.   BLOOD LOSS: Minimal.   INDICATION FOR PROCEDURE: A 44 year old gentleman who was admitted for a clotted graft. We had planned a declot today, but his potassium was far too high for this to be done safely and he was lethargic from altered mental status and severe acidosis with his hyperkalemia and end-stage renal disease. Temporary dialysis catheter needs to be placed. The risks and benefits were discussed. Informed consent was obtained.   DESCRIPTION OF PROCEDURE: The patient was laid flat in his bed. The right groin was sterilely prepped and draped and a sterile surgical field was created. The right femoral vein was visualized with ultrasound and found to be widely patent. It was accessed under direct ultrasound guidance without difficulty with a Seldinger needle and a permanent image was recorded. A J-wire was placed and after skin nick and dilatation a 30 cm long Trialysis type dialysis catheter was placed over the wire and the wire was removed. All 3 lumens withdrew dark red nonpulsatile blood and flushed easily with sterile saline and it was secured to the skin at 30 cm with 3 nylon sutures. Sterile dressing was placed.    ____________________________ Annice NeedyJason S. Dew, MD jsd:AT D: 05/14/2014 16:11:00 ET T: 05/15/2014 00:59:24 ET JOB#: [id number]  cc: Annice NeedyJason S. Dew, MD, <Dictator> Annice NeedyJASON S  DEW MD ELECTRONICALLY SIGNED 05/16/2014 17:29

## 2014-05-18 NOTE — H&P (Signed)
PATIENT NAME:  Perry [name], Perry [name] MR#:  [id number] DATE OF BIRTH:  [birth year]  DATE OF ADMISSION:  05/14/2014  ADMITTING PHYSICIAN:  Enid Baas, MD  PRIMARY CARE PHYSICIAN:  Nonlocal - Dr. Tanya Nones PRIMARY NEPHROLOGIST:  Dr. Marisue Humble at [facility] PRIMARY VASCULAR PHYSICIAN:  Annice Needy, MD  CHIEF COMPLAINT:  Altered mental status and hyperkalemia.   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  He was noted to be very lethargic and hypotensive which is not his normal baseline.  A STAT potassium showed that he had a potassium of 8.2 and he is being admitted.  He also has metabolic acidosis on his labs.  Most of the history is obtained from his wife at bedside.  According to wife, the patient has only history of hypertension and degenerative arthritis.  He has been on dialysis for 5 years and for the last year, she has been dialyzing him at home with hemodialysis.  He gets dialysis on all days of the week except Wednesday and Sunday.  The patient had a gastric bypass surgery banding procedure done on 05/05/2014.  His last dialysis on Monday went well.  On Tuesday, which was yesterday, when they tried to do dialysis, the access kept clotting off and the wife listened to make sure there was a bruit and she could not hear a bruit.  The patient continued to feel tired and was having tremors, myoclonic jerks, and this morning they called the office and presented to the lab for declotting the access and patient was noted to be lethargic and with a potassium of 8.2. Nephrology has been notified and the patient has a temporary dialysis catheter placed in STAT at this time.   PAST MEDICAL HISTORY:  1.  Hypertension.  2.   Degenerative disc disease.  3.  Bilateral arm pain from previous multiple dialysis accesses placement and steal syndrome.  4.  Obstructive sleep apnea on CPAP.    PAST SURGICAL HISTORY:  Multiple dialysis fistula placements.    ALLERGIES:  TAPE.    HOME MEDICATIONS:   1.  Renvela 800 mg 4 tablets t.i.d. with meals.  2.  PhosLo 3 tablets t.i.d. with meals.  3.  Renovite 1 tablet p.o. daily.  4.  Sensipar 60 mg 2 tablets at bedtime.  5.  Oxycodone 10 mg at bedtime p.r.n.  6.  Xanax 2 mg at bedtime p.r.n. anxiety.   SOCIAL HISTORY:  Lives at home with his wife, ambulatory, not working currently.  He used to smoke about 1-1/2 to 2 packs every day but that for the past 1 year not smoking any nicotine cigarettes, but doing vaporized cigarettes.  No alcohol or drug abuse.   FAMILY HISTORY:  Mom with heart disease, kidney problems in multiple cousins on dialysis.  REVIEW OF SYSTEMS:  Difficult to be obtained as the patient is lethargic at this time.   PHYSICAL EXAMINATION:  VITAL SIGNS:  Temperature 97.7, pulse 92, respirations 20, blood pressure 89/58 and pulse of 92% on 2 liters nasal cannula.  GENERAL:  Heavily built, well nourished male lying in bed not in any acute distress.  HEENT:  Normocephalic, atraumatic.  Pupils equal, round, reacting to light.  Anicteric sclerae.  Oropharynx is clear without erythema, mass or exudates.  NECK:  Supple, short and thick, no JVD or carotid bruits.  LUNGS:  Moving air bilaterally.  Decreased bibasilar breath sounds with fine crackles.  No use of accessory muscles for breathing.  CARDIOVASCULAR:  S1, S2, regular rate and rhythm.  A 3/6 systolic murmur heard.  No rubs, or gallops.  ABDOMEN:  Obese, soft, nontender, nondistended.  No hepatosplenomegaly.  Normal bowel sounds.  Laparoscopic incisions from recent surgery seen without any complications.    EXTREMITIES:  No pedal edema.  No clubbing or cyanosis.  Feeble dorsalis pedis pulses palpable  bilaterally.  Right groin Trialysis catheter is present at this time.  SKIN:  No acne, rash or lesions.   LYMPHATICS:  No cervical lymphadenopathy.   NEUROLOGICAL:  The patient is lethargic, no facial cranial nerve deficits noted on exam.  Arousable and following commands.   PSYCHOLOGICAL:  The patient is arousable.   LABORATORY DATA:  WBC 17.6, hemoglobin 14.4, hematocrit 44.8, platelet count 515,000.  ABG showing pH of 7.12, pCO2 of 44, pO2 is 62, bicarbonate is 14.3, and saturation of 89% on room air.  WBC and BMP are pending but his potassium is 8.2 at this time.  EKG showing normal sinus rhythm, peaked T waves.  No other acute ST abnormalities.  Heart rate of 82.     ASSESSMENT AND PLAN: This is a 44 year old obese African-American male with history of end-stage renal disease on hemodialysis, hypertension, obstructive sleep apnea, and chronic pain from dialysis, steal syndrome presents for declotting his access but is noted to be lethargic and have a potassium of 8.4.   1.  Acute renal failure and hyperkalemia.  Will need emergent hemodialysis.  Already discussed with Dr. Cherylann RatelLateef.  The patient will be admitted to intensive care unit and will have emergent hemodialysis.  His other laboratories are pending at this time.  STAT calcium gluconate and bicarbonate push and STAT calcium gluconate are ordered and also Kayexalate rectally if he cannot tolerate.   2.  Metabolic acidosis secondary to hyperkalemia.  Again bicarbonate push given and will need dialysis.  3.  Altered mental status, metabolic encephalopathy secondary to hyperkalemia and low bicarbonate at this time.  Monitor in CCU.  No need for intubation.  The patient is arousable and following commands and breathing fine.  Continue to monitor at this time.  4.  End-stage renal disease on hemodialysis.  Nephrology consulted.  Will need STAT hemodialysis at this time.  5.  Hypertension.  Does not take any medications at home.  6.  Deep vein  prophylaxis.  Will do subcutaneous heparin, TEDs and SCDs.    CODE STATUS:  Full code.    TOTAL CRITICAL CARE TIME SPENT ON ADMISSION:   60 minutes.      ____________________________ Enid Baasadhika Jarmarcus Wambold, MD rk:NT D: 05/14/2014 16:02:51 ET T: 05/14/2014 16:14:58 ET JOB#: [id number]  cc: Enid Baasadhika Minal Stuller, MD, <Dictator> Sabra Heckyan Sanford, MD Dr. Abran DukePickard Daeja Helderman MD ELECTRONICALLY SIGNED 05/15/2014 18:18

## 2014-05-18 NOTE — Consult Note (Signed)
CHIEF COMPLAINT and HISTORY:  Subjective/Chief Complaint ESRD, clotted graft   History of Present Illness Patient is a 44 yo with ESRD getting home HD.  He came in today because his graft was clotted.  He is lethargic, and much less alert than his normal self.  Unable to provide history.  wife says this started about 2 days ago.  He has a K of >8 and a pH of 7.1.   PAST MEDICAL/SURGICAL HISTORY:  Past Medical History:   seizures:    dialysis:    renal failure:    sleep apnea:    HTN:    fistula in left wrist for dialysis:    denies surg hx:   ALLERGIES:  Allergies:  Tape: Other, Itching  HOME MEDICATIONS:  Home Medications: Medication Instructions Status  Rena-Vite 1 tab(s) orally once a day (at bedtime) Active  ALPRAZolam 2 mg oral tablet 2 mg every HS but prn morning Active  PhosLo 667 mg oral capsule 3 cap(s) orally 3 times a day (with meals) Active  Sensipar 60 mg oral tablet 2 tab(s) orally once a day (at bedtime) Active  sevelamer carbonate carbonate 800 mg oral tablet 4 tab(s) orally 3 times a day (with meals) Active  sildenafil 20 mg oral tablet 1 to 2 tab(s) orally 3 times a day Active  traMADol 50 mg oral tablet 1 tab(s) orally once a day (at bedtime), As Needed - for Pain Active   Family and Social History:  Family History Hypertension  Diabetes Mellitus  renal problems, multiple family members   Social History negative ETOH   + Tobacco Prior (greater than 1 year)   Place of Living Home  with wife   Review of Systems:  ROS Pt not able to provide ROS  due to lethargy and AMS   Medications/Allergies Reviewed Medications/Allergies reviewed   Physical Exam:  GEN well developed, well nourished, critically ill appearing   HEENT pink conjunctivae, Oropharynx clear   NECK No masses  trachea midline   RESP normal resp effort  no use of accessory muscles   CARD regular rate  no JVD   VASCULAR ACCESS right arm AVF, clotted   ABD denies tenderness   soft   LYMPH negative neck, negative axillae   EXTR negative cyanosis/clubbing, positive edema   SKIN normal to palpation, skin turgor good   NEURO difficult to assess due to lethargy   PSYCH lethargic   LABS:  Laboratory Results: Lab:    27-Apr-16 14:55, ABG  pH (ABG) 7.120  7.350-7.450  NOTE: New Reference Range  08/10/13  PCO2 44  32-48  NOTE: New Reference Range  08/27/13  PO2 62  83-108  NOTE: New Reference Range  08/10/13  FiO2 21  Base Excess -15  -3-3  NOTE: New Reference Range  08/27/13  HCO3 14.3  21.0-28.0  NOTE: New Reference Range  08/10/13  O2 Saturation 89.8  O2 Device ra  Specimen Site (ABG)   LT BRACHIAL  Specimen Type (ABG) ARTERIAL  Patient Temp (ABG) 37.0  Routine Chem:    27-Apr-16 13:48, Potassium - Vascular Lab Only  Result Comment   - HAND DELIVERED   - Gwenlyn Found, RN 05/14/14 1355   Result(s) reported on 14 May 2014 at 02:35PM.    27-Apr-16 13:50, Potassium - Vascular Lab Only  Result Comment   - READ-BACK PROCESS PERFORMED.   - NOTIFIED OF CRITICAL VALUE   - Audley Hose RN 05/14/14 1400   Result(s) reported on 14 May 2014  at 02:00PM.    27-Apr-16 95:62, Basic Metabolic Panel (w/Total Calcium)  Glucose, Serum 123  65-99  NOTE: New Reference Range   03/25/14  BUN 134  6-20  NOTE: New Reference Range   03/25/14  Creatinine (comp) 26.24  0.61-1.24  NOTE: New Reference Range   03/25/14  Sodium, Serum 131  135-145  NOTE: New Reference Range   03/25/14  Chloride, Serum 88  101-111  NOTE: New Reference Range   03/25/14  CO2, Serum 14  22-32  NOTE: New Reference Range   03/25/14  Calcium (Total), Serum 8.2  8.9-10.3  NOTE: New Reference Range   03/25/14  Anion Gap 29  eGFR (African American) 2  eGFR (Non-African American) 2  eGFR values <79m/min/1.73 m2 may be an indication of chronic  kidney disease (CKD).  Calculated eGFR is useful in patients with stable renal function.  The eGFR calculation will not be  reliable in acutely ill patients  when serum creatinine is changing rapidly. It is not useful in  patients on dialysis. The eGFR calculation may not be applicable  to patients at the low and high extremes of body sizes, pregnant  women, and vegetarians.    27-Apr-16 14:41, CBC Profile  Result Comment   CBC - Specimen clotted. Spoke with NCorlis Hove@ [id number]/27/16 [facility]. Recollect   - ordered.   Result(s) reported on 14 May 2014 at 03:08PM.    27-Apr-16 14:55, ABG  Result Comment   - NOTIFIED OF CRITICAL VALUE   - READ-BACK PROCESS PERFORMED.   - 05/14/14,1500,Dr. Kalisetti   Result(s) reported on 14 May 2014 at 02:58PM.  Routine Hem:    27-Apr-16 14:41, CBC Profile  WBC (CBC) -  RBC (CBC) -  Hemoglobin (CBC) -  Hematocrit (CBC) -  Platelet Count (CBC) -  MCV -  MCH -  MCHC -  RDW -  Neutrophil % -  Lymphocyte % -  Monocyte % -  Eosinophil % -  Basophil % -  Neutrophil # -  Lymphocyte # -  Monocyte # -  Eosinophil # -  Basophil # -  Bands -  Segmented Neutrophils -  Lymphocytes -  Variant Lymphocytes -  Monocytes -  Eosinophil -  Basophil -  Metamyelocyte -  Myelocyte -  Promyelocyte -  Blast-Like -  Other Cells -  NRBC -  Diff Comment 1 -  Diff Comment 2 -  Diff Comment 3 -  Diff Comment 4 -  Diff Comment 5 -  Diff Comment 6 -  Diff Comment 7 -  Diff Comment 8 -  Diff Comment 9 -  Diff Comment 10 -  Result(s) reported on 14 May 2014 at 03:08PM.    27-Apr-16 15:19, CBC Profile  WBC (CBC) 17.6  RBC (CBC) 4.73  Hemoglobin (CBC) 14.4  Hematocrit (CBC) 44.8  Platelet Count (CBC) 515  Result(s) reported on 14 May 2014 at 03:40PM.  MCV 95  MCH 30.6  MCHC 32.2  RDW 18.0   RADIOLOGY:  Radiology Results: XRay:    05-May-15 08:18, Cervical Spine AP and Lateral  Cervical Spine AP and Lateral  REASON FOR EXAM:    723.1 cervicalgia, 729.5 pain in limb  COMMENTS:       PROCEDURE: DXR - DXR C- SPINE AP AND LATERAL  - May 21 2013  8:18AM      CLINICAL DATA:  Neck pain    EXAM:  CERVICAL SPINE - 2-3 VIEW    COMPARISON:  None.    FINDINGS:  Normal cervical alignment. Negative for fracture or mass. Mild disc  space narrowing at C3-4 and C4-5. Disc space narrowing and  spondylosis a mild degree at C5-6.     IMPRESSION:  Mild cervical spondylosis.      Electronically Signed    By: Lauretta Grill.D.    On: 05/21/2013 08:54         Verified By: Truett Perna, M.D.,  LabUnknown:  PACS Image   ASSESSMENT AND PLAN:  Assessment/Admission Diagnosis 1. ESRD 2. Clotted access 3. severe hyperkalemia, secondary to numbers 1 and 2 4. severe acidosis, secondary to numbers 1 and 2 5. hypertension, currently hypotensive   Plan His K is too high and he is not stable to do a declot today.  Temporary catheter placed at bedside.  Needs urgent HD and hospitalists admitting to CCU and Nephrology planning HD.  Likely try to declot Friday if he stabilizes.    level 3 consult   Electronic Signatures: Algernon Huxley (MD)  (Signed 27-Apr-16 16:08)  Authored: Chief Complaint and History, PAST MEDICAL/SURGICAL HISTORY, ALLERGIES, HOME MEDICATIONS, Family and Social History, Review of Systems, Physical Exam, LABS, RADIOLOGY, Assessment and Plan   Last Updated: 27-Apr-16 16:08 by Algernon Huxley (MD)

## 2014-05-19 NOTE — Op Note (Addendum)
PATIENT NAME:  Perry [name], Perry [name] MR#:  [id number] DATE OF BIRTH:  1971/01/02  DATE OF PROCEDURE:  05/16/2014  PREOPERATIVE DIAGNOSIS: Thrombosed atrioventricular fistula, right arm.   POSTOPERATIVE DIAGNOSES:  1. Strictured stenosis of the radial artery and anastomosis of the fistula, creating  low flow.  2. 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  3. End-stage renal disease requiring hemodialysis.  4. Morbid obesity.   PROCEDURES PERFORMED: 1. Contrast injection, right arm radiocephalic fistula.  2. Introduction catheter into the axillary artery.  3. Percutaneous transluminal angioplasty of the right radial artery and arterial anastomosis.  4. Percutaneous transluminal angioplasty and stent placement of the right innominate vein.   SURGEON: Renford Dills, MD.   SEDATION: Versed plus fentanyl.   CONTRAST USED: Isovue 95 mL.   FLUOROSCOPY TIME: 14.3 minutes.   ACCESS:  1. A 7 French sheath, antegrade direction, right arm AV fistula.  2. A 6 French sheath, retrograde direction, right arm AV fistula.   INDICATIONS: Mr. Stanforth is a 44 year old gentleman who was sent from dialysis secondary to thrombosis of his AV fistula. By physical examination, there was no thrill noted. He has a very large arm, consistent with his overall body habitus and it was very faint, if at all, when attempts at auscultation for bruit were present and so it did appear his AV fistula was thrombosed. It was not easily palpable and therefore, ultrasound will be used for access. On admission, he had a potassium in excess of 8 and therefore, underwent placement of a catheter and subsequently emergent dialysis. His potassium has now been returned to a normal range and he is, therefore, now undergoing salvage of his fistula. Risks and benefits have been reviewed. All questions have been answered. The patient agrees to proceed.   DESCRIPTION OF PROCEDURE: The patient is taken to the special procedure  suite, placed in the supine position. After adequate sedation is achieved, he is positioned supine. CPAP is placed so that he will receive adequate sedation and appropriate oxygenation. His right arm is then prepped and draped in a sterile fashion. Appropriate timeout is called. Because his fistula is not palpable, ultrasound is required and ultrasound is utilized. Upon imaging, the fistula is noted to be echolucent and compressible, indicating the fistula itself is still patent. Therefore 1% lidocaine is infiltrated over the fistula and access with a micropuncture needle in the antegrade direction is performed. Microwire followed by micro sheath, J-wire followed by a 6 French sheath is inserted. Hand injection of contrast is then used to demonstrate the fistula, extending to the shoulder and then the central veins. Abrupt occlusion of the innominate vein was noted with extensive collaterals around the neck and shoulder girdle.   Pressure was then held proximal to the sheath and reflux imaging is performed. Reflux imaging demonstrates a string sign at the arterial anastomosis. There is an ill-defined abnormality of the arterial circulation by this image, but it is not of the best quality. Given the degree of stricture and stenosis, very little contrast is making it into the native arterial system and therefore, it is uncertain as to what is going on, but this will be characterized later in the case. Given the finding of the arterial stricture, ultrasound is once again utilized. The fistula is imaged more proximally. Lidocaine is infiltrated and a micropuncture was used to access it. A J-wire is then advanced and the 6 French sheath is inserted in a retrograde direction. Using a Glidewire and a Kumpe catheter,  the anastomosis is crossed and with the Kumpe catheter within the artery, it is now better defined. There is narrowing and apparent thrombus within the radial artery and I do say radial as, again, I was still  uncertain as to the true nature of the anatomy.   Therefore, the wire was reintroduced and the catheter was advanced all the way up to the axillary, and distal subclavian, where imaging now demonstrates quite clearly that this gentleman has a very high takeoff of his radial artery, practically at the level of the axillary and then the ulnar/brachial artery extends down and is widely patent. It is within the radial segment that there does appear to be is some thrombus and some narrowing. Heparin 4000 units is given and a Magic torque wire is advanced through the Kumpe. Subsequently, a 3 x 4 balloon and then a 4 x 4 Lutonix and then a 5 x 4 balloon is used to angioplasty the arterial anastomosis. On followup imaging, it is now quite clear that there is some thrombus within the radial artery and therefore, the AngioJet is opened onto the field and advanced through the anastomosis and activated, extracting the thrombus.   A stricture remains and therefore, a 5 x 15 Lutonix balloon is advanced across, essentially the entire segment of the radial artery from its origin at the axillary artery through the anastomosis. Inflation with the Lutonix is to 14 atmospheres for 4 full minutes. A 6 x 4 balloon is then used to angioplasty in the anastomosis itself in that 1 location and then the Kumpe catheter is introduced back to the level of the axillary and hand injection of contrast now demonstrates that the radial artery from its origin to the anastomosis is widely patent. The anastomosis itself of the fistula is now widely patent. Attention is then turned to the occlusion of the innominate vein, which is crossed with the Glide wire and Kumpe catheter. Hand injection of contrast was performed within the superior vena cava to demonstrate intraluminal placement.   Subsequently, the Magic torque wire is reintroduced and negotiated into the inferior vena cava to allow for good purchase and an 8 x 10 [place] balloon is advanced  across the stenosis. It is inflated to 20 atmospheres for a little over 1 minute. Follow-up imaging demonstrates there is now forward flow, but there is clearly greater than 60% residual stenosis and a 10 x 6 balloon is advanced across this innominate lesion. Inflation is to 14 atmospheres for 2 minutes. This shows some improvement, but there is still greater than 40% residual stenosis and therefore, under magnified imaging, a 12 x 40 LifeStar stent is deployed across the lesion. The sheath prior to doing this was upsized from 6 to 7 and subsequently, the 10 x 6 balloon is reintroduced and used to post dilate the sheath. Excellent result is noted with rapid flow of contrast now through the innominate into the superior vena cava and dramatic reduction in all the collaterals that were previously identified.   By palpation the fistula now has a very strong thrill with minimal pulsatility. Pursestring sutures were placed around both sheaths. Both sheaths were removed and light pressure is held. There were no immediate complications.   INTERPRETATION: Initial images demonstrate the fistula is actually patent. Occlusion of the innominate vein is noted with patency of the superior vena cava. Ultimately, what is identified as a high bifurcation of the radial and ulnar arteries with the origin of the radial artery essentially at the level  of the distal axillary. Thrombus and a stricture is noted within the radial portion as well as a string sign at the arterial anastomosis and these were ultimately treated with AngioJet thrombectomy and then a 5 mm inflation across the radial artery and a 6 mm maximal inflation across the actual anastomosis. This yields an excellent result with minimal residual stenosis. The innominate lesion was then successfully crossed and dilated, but angioplasty is inadequate and therefore, a stent is placed and post dilated to 10 mm. At the conclusion of these procedures, the fistula was widely  patent with an excellent thrill and excellent flow.    ____________________________ Renford Dills, MD ggs:TT D: 05/16/2014 20:24:08 ET T: 05/17/2014 11:51:45 ET JOB#: [id number]  cc: Renford Dills, MD, <Dictator> Renford Dills MD ELECTRONICALLY SIGNED 06/10/2014 8:52

## 2014-05-19 NOTE — Discharge Summary (Addendum)
PATIENT NAME:  Perry [name], Perry [name] MR#:  [id number] DATE OF BIRTH:  24-Dec-1970  DATE OF ADMISSION:  05/14/2014 DATE OF DISCHARGE:  05/16/2014  DISCHARGE DIAGNOSES:  1. Hyperkalemia.  2. Metabolic encephalopathy.  3. End-stage renal disease.   PROCEDURES: AV fistula treated with tPA and stent placement was done by Dr. Gilda Crease.     CONSULTATIONS: Vascular surgery Dr. Gilda Crease, nephrology Dr. Cherylann Ratel.   CONDITION AT THE TIME OF DISCHARGE: Satisfactory.   CODE STATUS: Full code.   DISCHARGE MEDICATIONS: Rena-Vite 1 tablet p.o. once daily at bedtime, alprazolam 2 mg 1 tablet p.o. every bedtime as needed, PhosLo 667 mg 3 capsules p.o. 3 times a day with meals, sevelamer 800 mg 4 tablets p.o. 3 times a day with meals, sildenafil 20 mg 1-2 tablets p.o. 3 times a day, tramadol 50 mg 1 tablet p.o. once daily at bedtime as needed for pain, Sensipar 60 mg 2 tablets p.o. once daily at bedtime, Tylenol 325 mg 2 tablets p.o. every 4 hours as needed for mild pain or temperature greater than 100.4.   DISCHARGE INSTRUCTIONS:  Discharging home with renal diet, regular consistency. Follow up with primary care physician in 2 weeks. Follow up with nephrology in 1 week. Continue hemodialysis at home on Monday, Wednesday, and Friday. Also follow up appointment with Dr. Gilda Crease in 4-6 weeks.   BRIEF HISTORY AND PHYSICAL AND HOSPITAL COURSE: The patient is a 44 year old male who came into the ED with altered mental status and his potassium was found to be high. Please review history and physical for details. The patient went to vascular special procedures on the day of admission for declotting his AV fistula on the right arm.  A stat potassium showed potassium at 8.2. He was sent over to the ED. Stat nephrology consult was placed. He was given bicarbonate, calcium gluconate, and he was admitted to the hospital. The patient had a temporary dialysis catheter placed stat.   HOSPITAL COURSE:  1.  Altered mental status with  metabolic encephalopathy secondary to hyperkalemia and low bicarbonate. The patient was admitted to intensive care unit. He was given calcium gluconate and bicarbonate and Kayexalate in the ED for hyperkalemia. He had emergent hemodialysis done and following that his mentation was better.  2.  Acute kidney injury with hyperkalemia.  He needed emergent hemodialysis. Soon after the temporary dialysis catheter was placed by vascular surgery he had emergent dialysis done on April 27, repeat dialysis was done on April 28. The patient's mentation is back to normal. His altered mental status was thought to be from uremia. Renal function is better.  3.  End-stage renal disease on hemodialysis at home on Monday, Wednesday, and Friday.  His AV fistula was clotted.  He was seen by vascular surgery today, it was declotted with tPA and stent was placed by Dr. Gilda Crease. From their standpoint the patient can be discharged home. The patient tolerated the procedure well. Temporary dialysis catheter was removed prior to discharge. 4.  His mentation is completely back to normal according to the patient's wife. His tremors are also resolved.  5.  Obesity status post bariatric surgery recently. Continue followup with the bariatric surgeon as recommended as an outpatient.  6.  Hypertension, but blood pressure usually runs low, not on any blood pressure medications. According to nephrology his baseline systolic blood pressure is 80 and the patient is asymptomatic.    VITAL SIGNS TODAY: Temperature 98.1, pulse 103, respirations 18, blood pressure 100/64, pulse oximetry 94%.   LABORATORY  DATA AND IMAGING STUDIES: Repeat potassium today at 4.3. WBC 14.2 on April 28 probably from stress. Hemoglobin and hematocrit are normal, platelets 461,000. Glucose 97, BUN 77, creatinine 18.38, sodium 135, repeat potassium today at 4.3, calcium 8.6, phosphorus 7.3.   Nephrology has recommended the patient to continue his hemodialysis today and  continue on Monday, Wednesday, and Friday.    Plan of care was discussed in detail with the patient and his wife. They both verbalized understanding of the plan.   TOTAL TIME SPENT ON THE DISCHARGE: 45 minutes.     ____________________________ Ramonita LabAruna Andreyah Natividad, MD ag:bu D: 05/16/2014 16:58:50 ET T: 05/16/2014 17:13:06 ET JOB#: [id number]  cc: Ramonita LabAruna Gianfranco Araki, MD, <Dictator> Renford DillsGregory G. Schnier, MD Lennox PippinsMunsoor N. Lateef, MD Primary Care Physician  Ramonita LabARUNA Korri Ask MD ELECTRONICALLY SIGNED 05/28/2014 14:22

## 2014-05-20 ENCOUNTER — Ambulatory Visit: Payer: Self-pay

## 2014-05-20 DIAGNOSIS — M79671 Pain in right foot: Secondary | ICD-10-CM | POA: Diagnosis not present

## 2014-05-20 DIAGNOSIS — M79672 Pain in left foot: Secondary | ICD-10-CM | POA: Diagnosis not present

## 2014-05-20 DIAGNOSIS — Z6841 Body Mass Index (BMI) 40.0 and over, adult: Secondary | ICD-10-CM | POA: Diagnosis not present

## 2014-05-20 DIAGNOSIS — N19 Unspecified kidney failure: Secondary | ICD-10-CM | POA: Diagnosis not present

## 2014-05-22 DIAGNOSIS — N189 Chronic kidney disease, unspecified: Secondary | ICD-10-CM | POA: Diagnosis not present

## 2014-05-22 DIAGNOSIS — M179 Osteoarthritis of knee, unspecified: Secondary | ICD-10-CM | POA: Diagnosis not present

## 2014-05-22 DIAGNOSIS — M47816 Spondylosis without myelopathy or radiculopathy, lumbar region: Secondary | ICD-10-CM | POA: Diagnosis not present

## 2014-05-22 DIAGNOSIS — G4730 Sleep apnea, unspecified: Secondary | ICD-10-CM | POA: Diagnosis not present

## 2014-05-22 DIAGNOSIS — G8929 Other chronic pain: Secondary | ICD-10-CM | POA: Diagnosis not present

## 2014-05-23 DIAGNOSIS — I10 Essential (primary) hypertension: Secondary | ICD-10-CM | POA: Diagnosis not present

## 2014-05-23 DIAGNOSIS — T859XXA Unspecified complication of internal prosthetic device, implant and graft, initial encounter: Secondary | ICD-10-CM | POA: Diagnosis not present

## 2014-05-23 DIAGNOSIS — T82510S Breakdown (mechanical) of surgically created arteriovenous fistula, sequela: Secondary | ICD-10-CM | POA: Diagnosis not present

## 2014-05-23 DIAGNOSIS — Y841 Kidney dialysis as the cause of abnormal reaction of the patient, or of later complication, without mention of misadventure at the time of the procedure: Secondary | ICD-10-CM | POA: Diagnosis not present

## 2014-05-23 DIAGNOSIS — N186 End stage renal disease: Secondary | ICD-10-CM | POA: Diagnosis not present

## 2014-05-29 ENCOUNTER — Ambulatory Visit (INDEPENDENT_AMBULATORY_CARE_PROVIDER_SITE_OTHER): Payer: Medicare Other | Admitting: Family Medicine

## 2014-05-29 ENCOUNTER — Encounter: Payer: Self-pay | Admitting: Family Medicine

## 2014-05-29 VITALS — BP 112/62 | HR 68 | Temp 98.3°F | Resp 14 | Ht 66.0 in | Wt 305.0 lb

## 2014-05-29 DIAGNOSIS — F411 Generalized anxiety disorder: Secondary | ICD-10-CM

## 2014-05-29 DIAGNOSIS — Z992 Dependence on renal dialysis: Secondary | ICD-10-CM | POA: Diagnosis not present

## 2014-05-29 DIAGNOSIS — Z09 Encounter for follow-up examination after completed treatment for conditions other than malignant neoplasm: Secondary | ICD-10-CM | POA: Diagnosis not present

## 2014-05-29 DIAGNOSIS — N186 End stage renal disease: Secondary | ICD-10-CM

## 2014-05-29 MED ORDER — ESCITALOPRAM OXALATE 10 MG PO TABS: 10.0000 mg | ORAL_TABLET | Freq: Every day | ORAL | Status: DC

## 2014-05-29 NOTE — Progress Notes (Signed)
Subjective:    Patient ID: Perry [name], male    DOB: 04/07/1970, 44 y.o.   MRN: [id number]  HPI Patient is a 44 year old African-American male who has recently undergone gastric bypass surgery. He has successfully lost 10 kg since the surgery. Unfortunately he was recently admitted to the hospital. He performs dialysis nightly at home through the AV fistula in his right upper arm. Apparently there was a clot proximal to the AV fistula. Therefore his home dialysis was unsuccessful. His potassium levels rose to greater than 8. The patient became very disoriented and confused. He went to the hospital and underwent emergent dialysis through a temporary catheter. Vascular surgery was then able to reestablish vascular access in the AV fistula by the administration of TPA. He is scheduled to follow-up with vascular surgery later this month. He is still performing nightly dialysis at home with no complications. Unfortunately he is still continuing to require Xanax 1-2 mg every night to help him sleep due to anxiety. 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 Past Medical History  Diagnosis Date  . Dialysis patient   . Sleep apnea     cpap-   . Anxiety   . Kidney failure     on dialysis since [facility]- [facility]- nephrolog[facility]   . GERD (gastroesophageal reflux disease)   . High blood pressure     hx of   . Acute meniscal tear of left knee   . Acute meniscal tear of right knee   . Chronic back pain     due to weight   . Hemodialysis patient     since 2011, does hemodialysis at home, wife does she is a [employer] , Daily except wed and sun   Past Surgical History  Procedure Laterality Date  . Av fistula repair    . Av fistula placement      left arm -   . Stents in left lower forearm       due to clotting in Av fistula   . Av fistula right  wrist     . Av fistula right upper arm     . Thrombectomies to left lower foreram fistula     . 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 Laparoscopic gastric sleeve resection N/A 05/05/2014    Procedure: LAPAROSCOPIC GASTRIC SLEEVE RESECTION;  Surgeon: Luretha MurphyMatthew Martin, MD;  Location: [facility];  Service: General;  Laterality: N/A;   Current Outpatient Prescriptions on File Prior to Visit  Medication Sig Dispense Refill  . alprazolam (XANAX) 2 MG tablet TAKE ONE TABLET BY MOUTH AT BEDTIME AS NEEDED FOR SLEEP **REFILLS  MUST  BE  AT  LEAST  30  DAYS  APART** 30 tablet 2  . calcitRIOL (ROCALTROL) 0.25 MCG capsule Take 0.75 mcg by mouth 3 (three) times daily with meals.     . cinacalcet (SENSIPAR) 60 MG tablet Take 120 mg by mouth every evening.     . diltiazem 2 % GEL Apply 1 application topically 3 (three) times daily. (Patient taking differently: Apply 1 application topically as directed. ) 30 g 3  . multivitamin (RENA-VIT) TABS tablet Take 1 tablet by mouth at bedtime.     . Oxycodone HCl 10 MG TABS Take 10 mg by mouth every 8 (eight) hours as needed (for pain).    .[name]  sevelamer (RENVELA) 800 MG tablet Take 3,200 mg by mouth 3 (three) times daily with meals.      No current facility-administered medications on file prior to visit.   Allergies  Allergen Reactions  . Tape Itching   History   Social History  . Marital Status: Married    Spouse Name: N/A  . Number of Children: 2  . Years of Education: 13   Occupational History  .      Disabled   Social History Main Topics  . Smoking status: Former Smoker -- 1.00 packs/day for 20 years    Types: Cigarettes    Quit date: 06/17/2013  . Smokeless tobacco: Never Used  . Alcohol Use: No  . Drug Use: No  . Sexual Activity: Yes   Other Topics Concern  . Not on file   Social History Narrative   Patient lives home at home with his wife Hydrographic surveyor(Crystal ). Patient is disabled.    Caffeine-one cup of coffee daily.   Right handed.       Review of Systems  All other systems reviewed and are negative.      Objective:   Physical Exam  Constitutional: He appears well-developed and well-nourished.  Cardiovascular: Normal rate, regular rhythm and normal heart sounds.   No murmur heard. Pulmonary/Chest: Effort normal and breath sounds normal. No respiratory distress. He has no wheezes. He has no rales.  Abdominal: Soft. Bowel sounds are normal.  Vitals reviewed.         Assessment & Plan:  GAD (generalized anxiety disorder) - Plan: escitalopram (LEXAPRO) 10 MG tablet, CBC with Differential/Platelet, COMPLETE METABOLIC PANEL WITH GFR  Hospital discharge follow-up - Plan: CBC with Differential/Platelet, COMPLETE METABOLIC PANEL WITH GFR  ESRD on dialysis - Plan: CBC with Differential/Platelet, COMPLETE METABOLIC PANEL WITH GFR  I will start the patient on Lexapro 10 mg a day. Recheck in one month. 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 I will also check a CBC and a CMP to follow up on the patient's electrolyte abnormalities including his potassium. Patient states that he has not had his potassium checked since his discharge from the hospital at the end of April

## 2014-05-30 ENCOUNTER — Encounter: Payer: Self-pay | Admitting: Family Medicine

## 2014-05-30 LAB — CBC WITH DIFFERENTIAL/PLATELET
Basophils Absolute: 0 10*3/uL (ref 0.0–0.1)
Basophils Relative: 0 % (ref 0–1)
EOS ABS: 0.3 10*3/uL (ref 0.0–0.7)
EOS PCT: 4 % (ref 0–5)
HCT: 39.5 % (ref 39.0–52.0)
HEMOGLOBIN: 12.6 g/dL — AB (ref 13.0–17.0)
Lymphocytes Relative: 8 % — ABNORMAL LOW (ref 12–46)
Lymphs Abs: 0.7 10*3/uL (ref 0.7–4.0)
MCH: 30.3 pg (ref 26.0–34.0)
MCHC: 31.9 g/dL (ref 30.0–36.0)
MCV: 95 fL (ref 78.0–100.0)
MPV: 8.2 fL — AB (ref 8.6–12.4)
Monocytes Absolute: 0.6 10*3/uL (ref 0.1–1.0)
Monocytes Relative: 7 % (ref 3–12)
Neutro Abs: 6.7 10*3/uL (ref 1.7–7.7)
Neutrophils Relative %: 81 % — ABNORMAL HIGH (ref 43–77)
Platelets: 308 10*3/uL (ref 150–400)
RBC: 4.16 MIL/uL — ABNORMAL LOW (ref 4.22–5.81)
RDW: 18.8 % — AB (ref 11.5–15.5)
WBC: 8.3 10*3/uL (ref 4.0–10.5)

## 2014-05-30 LAB — COMPLETE METABOLIC PANEL WITH GFR
ALBUMIN: 3.3 g/dL — AB (ref 3.5–5.2)
ALT: 12 U/L (ref 0–53)
AST: 13 U/L (ref 0–37)
Alkaline Phosphatase: 80 U/L (ref 39–117)
BUN: 55 mg/dL — ABNORMAL HIGH (ref 6–23)
CO2: 25 mEq/L (ref 19–32)
CREATININE: 14.43 mg/dL — AB (ref 0.50–1.35)
Calcium: 8.1 mg/dL — ABNORMAL LOW (ref 8.4–10.5)
Chloride: 92 mEq/L — ABNORMAL LOW (ref 96–112)
GFR, EST NON AFRICAN AMERICAN: 4 mL/min — AB
GFR, Est African American: 4 mL/min — ABNORMAL LOW
Glucose, Bld: 88 mg/dL (ref 70–99)
POTASSIUM: 4.1 meq/L (ref 3.5–5.3)
Sodium: 137 mEq/L (ref 135–145)
Total Bilirubin: 0.2 mg/dL (ref 0.2–1.2)
Total Protein: 6.1 g/dL (ref 6.0–8.3)

## 2014-06-02 ENCOUNTER — Encounter
Admission: AD | Disposition: A | Discharge status: Home / Self Care | Payer: Medicare Other | Source: Ambulatory Visit | Attending: Vascular Surgery

## 2014-06-02 ENCOUNTER — Ambulatory Visit
Admission: AD | Admit: 2014-06-02 | Discharge: 2014-06-02 | Disposition: A | Discharge status: Home / Self Care | Payer: Medicare Other | Source: Ambulatory Visit | Attending: Vascular Surgery | Admitting: Vascular Surgery

## 2014-06-02 ENCOUNTER — Encounter
Admission: RE | Disposition: A | Discharge status: Home / Self Care | Payer: Medicare Other | Source: Ambulatory Visit | Attending: Vascular Surgery

## 2014-06-02 ENCOUNTER — Encounter: Payer: Self-pay | Admitting: *Deleted

## 2014-06-02 ENCOUNTER — Ambulatory Visit
Admission: RE | Admit: 2014-06-02 | Discharge: 2014-06-02 | Disposition: A | Discharge status: Home / Self Care | Payer: Medicare Other | Source: Ambulatory Visit | Attending: Vascular Surgery | Admitting: Vascular Surgery

## 2014-06-02 DIAGNOSIS — G4730 Sleep apnea, unspecified: Secondary | ICD-10-CM | POA: Insufficient documentation

## 2014-06-02 DIAGNOSIS — I82611 Acute embolism and thrombosis of superficial veins of right upper extremity: Secondary | ICD-10-CM | POA: Diagnosis not present

## 2014-06-02 DIAGNOSIS — E872 Acidosis: Secondary | ICD-10-CM | POA: Insufficient documentation

## 2014-06-02 DIAGNOSIS — Y999 Unspecified external cause status: Secondary | ICD-10-CM

## 2014-06-02 DIAGNOSIS — R569 Unspecified convulsions: Secondary | ICD-10-CM | POA: Insufficient documentation

## 2014-06-02 DIAGNOSIS — Z992 Dependence on renal dialysis: Secondary | ICD-10-CM

## 2014-06-02 DIAGNOSIS — T82868A Thrombosis of vascular prosthetic devices, implants and grafts, initial encounter: Secondary | ICD-10-CM

## 2014-06-02 DIAGNOSIS — N186 End stage renal disease: Secondary | ICD-10-CM

## 2014-06-02 DIAGNOSIS — Z79899 Other long term (current) drug therapy: Secondary | ICD-10-CM

## 2014-06-02 DIAGNOSIS — E875 Hyperkalemia: Secondary | ICD-10-CM | POA: Insufficient documentation

## 2014-06-02 DIAGNOSIS — I120 Hypertensive chronic kidney disease with stage 5 chronic kidney disease or end stage renal disease: Secondary | ICD-10-CM | POA: Insufficient documentation

## 2014-06-02 DIAGNOSIS — T82898A Other specified complication of vascular prosthetic devices, implants and grafts, initial encounter: Secondary | ICD-10-CM | POA: Diagnosis not present

## 2014-06-02 HISTORY — PX: PERIPHERAL VASCULAR CATHETERIZATION: SHX172C

## 2014-06-02 LAB — POTASSIUM (ARMC VASCULAR LAB ONLY): POTASSIUM (ARMC VASCULAR LAB): 4.5

## 2014-06-02 SURGERY — A/V SHUNTOGRAM/FISTULAGRAM
Anesthesia: Moderate Sedation

## 2014-06-02 SURGERY — DIALYSIS/PERMA CATHETER INSERTION

## 2014-06-02 MED ORDER — FENTANYL CITRATE (PF) 100 MCG/2ML IJ SOLN
INTRAMUSCULAR | Status: AC
  Filled 2014-06-02: qty 2

## 2014-06-02 MED ORDER — SODIUM CHLORIDE 0.9 % IV SOLN
INTRAVENOUS | Status: DC
Start: 2014-06-02 — End: 2014-06-02

## 2014-06-02 MED ORDER — MIDAZOLAM HCL 10 MG/2ML IJ SOLN
2.0000 mg | Freq: Once | INTRAMUSCULAR | Status: AC
  Administered 2014-06-02: 2 mg via INTRAVENOUS
  Filled 2014-06-02: qty 0.4

## 2014-06-02 MED ORDER — ONDANSETRON HCL 4 MG/2ML IJ SOLN: 4.0000 mg | INTRAMUSCULAR | Status: DC | PRN

## 2014-06-02 MED ORDER — HYDROMORPHONE HCL 1 MG/ML IJ SOLN: 1.0000 mg | INTRAMUSCULAR | Status: DC | PRN

## 2014-06-02 MED ORDER — LIDOCAINE-EPINEPHRINE (PF) 1 %-1:200000 IJ SOLN
INTRAMUSCULAR | Status: AC
  Filled 2014-06-02: qty 60

## 2014-06-02 MED ORDER — FENTANYL CITRATE (PF) 100 MCG/2ML IJ SOLN
INTRAMUSCULAR | Status: DC | PRN
  Administered 2014-06-02 (×2): 50 ug via INTRAVENOUS
  Administered 2014-06-02: 100 ug via INTRAVENOUS
  Administered 2014-06-02 (×3): 50 ug via INTRAVENOUS

## 2014-06-02 MED ORDER — FENTANYL CITRATE (PF) 100 MCG/2ML IJ SOLN
50.0000 ug | Freq: Once | INTRAMUSCULAR | Status: AC
  Administered 2014-06-02: 50 ug via INTRAVENOUS

## 2014-06-02 MED ORDER — LIDOCAINE-EPINEPHRINE 1 %-1:100000 IJ SOLN
INTRAMUSCULAR | Status: DC | PRN
Start: 2014-06-02 — End: 2014-06-02
  Administered 2014-06-02: 10 mL via INTRADERMAL

## 2014-06-02 MED ORDER — HEPARIN (PORCINE) IN NACL 2-0.9 UNIT/ML-% IJ SOLN
INTRAMUSCULAR | Status: AC
  Filled 2014-06-02: qty 500

## 2014-06-02 MED ORDER — HEPARIN SODIUM (PORCINE) 10000 UNIT/ML IJ SOLN
INTRAMUSCULAR | Status: AC
  Filled 2014-06-02: qty 1

## 2014-06-02 MED ORDER — ONDANSETRON HCL 4 MG/2ML IJ SOLN: 4.0000 mg | Freq: Once | INTRAMUSCULAR | Status: DC

## 2014-06-02 MED ORDER — CEFAZOLIN SODIUM 1-5 GM-% IV SOLN
INTRAVENOUS | Status: AC
  Filled 2014-06-02: qty 50

## 2014-06-02 MED ORDER — STERILE WATER FOR INJECTION IJ SOLN
INTRAMUSCULAR | Status: AC
  Filled 2014-06-02: qty 10

## 2014-06-02 MED ORDER — ATROPINE SULFATE 0.1 MG/ML IJ SOLN: 0.5000 mg | INTRAMUSCULAR | Status: DC | PRN

## 2014-06-02 MED ORDER — MIDAZOLAM HCL 2 MG/2ML IJ SOLN
INTRAMUSCULAR | Status: DC | PRN
  Administered 2014-06-02: 1 mg via INTRAVENOUS
  Administered 2014-06-02: 2 mg via INTRAVENOUS
  Administered 2014-06-02: 1 mg via INTRAVENOUS
  Administered 2014-06-02: 2 mg via INTRAVENOUS

## 2014-06-02 MED ORDER — MIDAZOLAM HCL 5 MG/5ML IJ SOLN
INTRAMUSCULAR | Status: AC
  Filled 2014-06-02: qty 5

## 2014-06-02 MED ORDER — LIDOCAINE-EPINEPHRINE (PF) 1 %-1:200000 IJ SOLN
INTRAMUSCULAR | Status: AC
  Filled 2014-06-02: qty 30

## 2014-06-02 MED ORDER — HEPARIN SODIUM (PORCINE) 1000 UNIT/ML IJ SOLN
INTRAMUSCULAR | Status: DC | PRN
  Administered 2014-06-02: 2500 [IU] via INTRAVENOUS
  Administered 2014-06-02: 4000 [IU] via INTRAVENOUS

## 2014-06-02 MED ORDER — IOHEXOL 300 MG/ML  SOLN
INTRAMUSCULAR | Status: DC | PRN
  Administered 2014-06-02: 85 mL via INTRAVENOUS

## 2014-06-02 MED ORDER — APIXABAN 2.5 MG PO TABS: 2.5000 mg | ORAL_TABLET | Freq: Two times a day (BID) | ORAL | Status: DC

## 2014-06-02 MED ORDER — APIXABAN 2.5 MG PO TABS
2.5000 mg | ORAL_TABLET | Freq: Two times a day (BID) | ORAL | Status: DC
  Filled 2014-06-02 (×3): qty 1

## 2014-06-02 MED ORDER — CEFAZOLIN SODIUM 1-5 GM-% IV SOLN
1.0000 g | Freq: Once | INTRAVENOUS | Status: AC
  Administered 2014-06-02: 1 g via INTRAVENOUS

## 2014-06-02 MED ORDER — MIDAZOLAM HCL 2 MG/2ML IJ SOLN
INTRAMUSCULAR | Status: AC
  Filled 2014-06-02: qty 2

## 2014-06-02 MED ORDER — HEPARIN SODIUM (PORCINE) 1000 UNIT/ML IJ SOLN
INTRAMUSCULAR | Status: AC
  Filled 2014-06-02: qty 1

## 2014-06-02 MED ORDER — HEPARIN (PORCINE) IN NACL 2-0.9 UNIT/ML-% IJ SOLN
INTRAMUSCULAR | Status: AC
  Filled 2014-06-02: qty 1000

## 2014-06-02 MED ORDER — SODIUM CHLORIDE 0.9 % IV SOLN
INTRAVENOUS | Status: DC
  Administered 2014-06-02: 16:00:00 via INTRAVENOUS

## 2014-06-02 MED ORDER — ALTEPLASE 2 MG IJ SOLR
INTRAMUSCULAR | Status: DC | PRN
  Administered 2014-06-02: 4 mg

## 2014-06-02 SURGICAL SUPPLY — 20 items
BALLN ARMADA 10X80X80 (BALLOONS) ×3
BALLN DORADO 6X80X80 (BALLOONS) ×3
BALLN ULTRVRSE 8X150X75 (BALLOONS) ×3 IMPLANT
BALLOON ARMADA 10X80X80 (BALLOONS) ×1 IMPLANT
BALLOON DORADO 6X80X80 (BALLOONS) ×1 IMPLANT
CATH BEACON 5.038 65CM KMP-01 (CATHETERS) ×3 IMPLANT
CATH EMBOLECTOMY 5FR (BALLOONS) ×3 IMPLANT
CATH KUMPE (CATHETERS) ×2
CATH SLIP 5FR 0.38 X 40 KMP (CATHETERS) ×1 IMPLANT
DEVICE PRESTO INFLATION (MISCELLANEOUS) ×9 IMPLANT
DRAPE BRACHIAL (DRAPES) ×3 IMPLANT
KIT 5FR STIFF NT/TG (MISCELLANEOUS) ×3 IMPLANT
PACK ANGIOGRAPHY (CUSTOM PROCEDURE TRAY) ×3 IMPLANT
SET AVX THROMB ULT (MISCELLANEOUS) ×3 IMPLANT
SHEATH BRITE TIP 6FRX5.5 (SHEATH) ×6 IMPLANT
STENT VIABAHN 10X100X120 (Permanent Stent) ×3 IMPLANT
STENT VIABAHN 10X50X120 (Permanent Stent) ×3 IMPLANT
STENT VIABAHN 9X50X120 (Permanent Stent) ×3 IMPLANT
TOWEL OR 17X26 4PK STRL BLUE (TOWEL DISPOSABLE) ×3 IMPLANT
WIRE MAGIC TOR.035 180C (WIRE) ×6 IMPLANT

## 2014-06-02 SURGICAL SUPPLY — 7 items
CATH BEACON 5.038 65CM KMP-01 (CATHETERS) ×3 IMPLANT
CATH PALINDROME REV 23CM (CATHETERS) ×3 IMPLANT
GUIDEWIRE AMPLATZ SHORT (WIRE) ×3 IMPLANT
PACK ANGIOGRAPHY (CUSTOM PROCEDURE TRAY) ×3 IMPLANT
SHEATH BRITE TIP 8FRX11 (SHEATH) ×3 IMPLANT
TOWEL OR 17X26 4PK STRL BLUE (TOWEL DISPOSABLE) ×3 IMPLANT
WIRE MAGIC TORQUE 260C (WIRE) ×3 IMPLANT

## 2014-06-02 NOTE — H&P (Signed)
[facility] History & Physical Update  The patient was interviewed and re-examined.  His access has clotted, and he now needs a Permcath.  Will place today.  Previous H&P information unchanged  Perry Uzelac, MD  06/02/2014, 2:27 PM

## 2014-06-02 NOTE — Progress Notes (Signed)
Pt stable post procedure, taking po's without difficulty,vss. Wife present, Dr Wyn Quakerew in to speak with pt and wife, questions answered, return appt with duplex scan to pt/wife.

## 2014-06-02 NOTE — H&P (Signed)
[facility] History & Physical Update  The patient was interviewed and re-examined.  The patient's previous History and Physical has been reviewed and is unchanged.  There is no change in the plan of care.  DEW,JASON, MD  06/02/2014, 8:08 AM

## 2014-06-02 NOTE — Discharge Instructions (Signed)
end-stage renal failure, Fistulogram, Care After Refer to this sheet in the next few weeks. These instructions provide you with information on caring for yourself after your procedure. Your health care provider may also give you more specific instructions. Your treatment has been planned according to current medical practices, but problems sometimes occur. Call your health care provider if you have any problems or questions after your procedure. WHAT TO EXPECT AFTER THE PROCEDURE After your procedure, it is typical to have the following:  A small amount of discomfort in the area where the catheters were placed.  A small amount of bruising around the fistula.  Sleepiness and fatigue. HOME CARE INSTRUCTIONS  Rest at home for the day following your procedure.  Do not drive or operate heavy machinery while taking pain medicine.  Take medicines only as directed by your health care provider.  Do not take baths, swim, or use a hot tub until your health care provider approves. You may shower 24 hours after the procedure or as directed by your health care provider.  There are many different ways to close and cover an incision, including stitches, skin glue, and adhesive strips. Follow your health care provider's instructions on:  Incision care.  Bandage (dressing) changes and removal.  Incision closure removal.  Monitor your dialysis fistula carefully. SEEK MEDICAL CARE IF:  You have drainage, redness, swelling, or pain at your catheter site.  You have a fever.  You have chills. SEEK IMMEDIATE MEDICAL CARE IF:  You feel weak.  You have trouble balancing.  You have trouble moving your arms or legs.  You have problems with your speech or vision.  You can no longer feel a vibration or buzz when you put your fingers over your dialysis fistula.  The limb that was used for the procedure:  Swells.  Is painful.  Is cold.  Is discolored, such as blue or pale white. Document  Released: 05/20/2013 Document Reviewed: 02/22/2013 [facility] Patient Information 2015 [employer] [place]. This information is not intended to replace advice given to you by your health care provider. Make sure you discuss any questions you have with your health care provider.

## 2014-06-02 NOTE — Discharge Instructions (Signed)
The drugs you were given will stay in your system until tomorrow, so for the next 24 hours you should not.  Drive an automobile. Make any legal decisions.  Drink any alcoholic beverages.  Today you should start with liquids and gradually work up to solid foods as your are able to tolerate them  Resume your regular medications as prescribed by your doctor.  Avoid aspirin for 24 hours.    Change the Band-Aid or dressing as needed.  After a 2 days no dressing as needed.  Avoid strenuous activity for the remainder of the day.  Please notify your primary physician immediately if you have any unusual bleeding, trouble breathing, fever >100 degrees or pain not relieved by the medication your doctor prescribed for your doctor prescribed for you physician   Follow up on June 29th at 10:00

## 2014-06-04 ENCOUNTER — Encounter: Payer: Self-pay | Admitting: Vascular Surgery

## 2014-06-09 ENCOUNTER — Encounter: Payer: Self-pay | Admitting: Family Medicine

## 2014-06-09 DIAGNOSIS — F411 Generalized anxiety disorder: Secondary | ICD-10-CM

## 2014-06-09 MED ORDER — ESCITALOPRAM OXALATE 10 MG PO TABS: 10.0000 mg | ORAL_TABLET | Freq: Every day | ORAL | Status: DC

## 2014-06-17 DIAGNOSIS — I10 Essential (primary) hypertension: Secondary | ICD-10-CM | POA: Diagnosis not present

## 2014-06-17 DIAGNOSIS — Z01810 Encounter for preprocedural cardiovascular examination: Secondary | ICD-10-CM | POA: Diagnosis not present

## 2014-06-17 DIAGNOSIS — N186 End stage renal disease: Secondary | ICD-10-CM | POA: Diagnosis not present

## 2014-06-17 DIAGNOSIS — I120 Hypertensive chronic kidney disease with stage 5 chronic kidney disease or end stage renal disease: Secondary | ICD-10-CM | POA: Diagnosis not present

## 2014-06-17 DIAGNOSIS — Z992 Dependence on renal dialysis: Secondary | ICD-10-CM | POA: Diagnosis not present

## 2014-06-17 DIAGNOSIS — T82898A Other specified complication of vascular prosthetic devices, implants and grafts, initial encounter: Secondary | ICD-10-CM | POA: Diagnosis not present

## 2014-06-17 DIAGNOSIS — Y841 Kidney dialysis as the cause of abnormal reaction of the patient, or of later complication, without mention of misadventure at the time of the procedure: Secondary | ICD-10-CM | POA: Diagnosis not present

## 2014-06-17 DIAGNOSIS — I82611 Acute embolism and thrombosis of superficial veins of right upper extremity: Secondary | ICD-10-CM | POA: Diagnosis not present

## 2014-06-18 DIAGNOSIS — D509 Iron deficiency anemia, unspecified: Secondary | ICD-10-CM | POA: Diagnosis not present

## 2014-06-18 DIAGNOSIS — N2581 Secondary hyperparathyroidism of renal origin: Secondary | ICD-10-CM | POA: Diagnosis not present

## 2014-06-18 DIAGNOSIS — Z79899 Other long term (current) drug therapy: Secondary | ICD-10-CM | POA: Diagnosis not present

## 2014-06-18 DIAGNOSIS — D631 Anemia in chronic kidney disease: Secondary | ICD-10-CM | POA: Diagnosis not present

## 2014-06-18 DIAGNOSIS — E878 Other disorders of electrolyte and fluid balance, not elsewhere classified: Secondary | ICD-10-CM | POA: Diagnosis not present

## 2014-06-18 DIAGNOSIS — E46 Unspecified protein-calorie malnutrition: Secondary | ICD-10-CM | POA: Diagnosis not present

## 2014-06-18 DIAGNOSIS — Z4931 Encounter for adequacy testing for hemodialysis: Secondary | ICD-10-CM | POA: Diagnosis not present

## 2014-06-18 DIAGNOSIS — N186 End stage renal disease: Secondary | ICD-10-CM | POA: Diagnosis not present

## 2014-06-18 DIAGNOSIS — D689 Coagulation defect, unspecified: Secondary | ICD-10-CM | POA: Diagnosis not present

## 2014-06-19 DIAGNOSIS — M47816 Spondylosis without myelopathy or radiculopathy, lumbar region: Secondary | ICD-10-CM | POA: Diagnosis not present

## 2014-06-19 DIAGNOSIS — Z79891 Long term (current) use of opiate analgesic: Secondary | ICD-10-CM | POA: Diagnosis not present

## 2014-06-19 DIAGNOSIS — G8929 Other chronic pain: Secondary | ICD-10-CM | POA: Diagnosis not present

## 2014-06-19 DIAGNOSIS — N19 Unspecified kidney failure: Secondary | ICD-10-CM | POA: Diagnosis not present

## 2014-06-19 DIAGNOSIS — M791 Myalgia: Secondary | ICD-10-CM | POA: Diagnosis not present

## 2014-06-19 DIAGNOSIS — M545 Low back pain: Secondary | ICD-10-CM | POA: Diagnosis not present

## 2014-06-19 DIAGNOSIS — T82898A Other specified complication of vascular prosthetic devices, implants and grafts, initial encounter: Secondary | ICD-10-CM | POA: Diagnosis not present

## 2014-06-23 ENCOUNTER — Encounter
Admission: RE | Admit: 2014-06-23 | Discharge: 2014-06-23 | Disposition: A | Discharge status: Home / Self Care | Payer: Medicare Other | Source: Ambulatory Visit | Attending: Vascular Surgery | Admitting: Vascular Surgery

## 2014-06-23 DIAGNOSIS — I120 Hypertensive chronic kidney disease with stage 5 chronic kidney disease or end stage renal disease: Secondary | ICD-10-CM | POA: Diagnosis not present

## 2014-06-23 DIAGNOSIS — M542 Cervicalgia: Secondary | ICD-10-CM | POA: Diagnosis not present

## 2014-06-23 DIAGNOSIS — Z79899 Other long term (current) drug therapy: Secondary | ICD-10-CM | POA: Diagnosis not present

## 2014-06-23 DIAGNOSIS — F17200 Nicotine dependence, unspecified, uncomplicated: Secondary | ICD-10-CM | POA: Diagnosis not present

## 2014-06-23 DIAGNOSIS — Z8249 Family history of ischemic heart disease and other diseases of the circulatory system: Secondary | ICD-10-CM | POA: Diagnosis not present

## 2014-06-23 DIAGNOSIS — Z7901 Long term (current) use of anticoagulants: Secondary | ICD-10-CM | POA: Diagnosis not present

## 2014-06-23 DIAGNOSIS — N186 End stage renal disease: Secondary | ICD-10-CM | POA: Diagnosis not present

## 2014-06-23 LAB — BASIC METABOLIC PANEL
ANION GAP: 25 — AB (ref 5–15)
BUN: 98 mg/dL — AB (ref 6–20)
CALCIUM: 7.8 mg/dL — AB (ref 8.9–10.3)
CHLORIDE: 89 mmol/L — AB (ref 101–111)
CO2: 34 mmol/L — AB (ref 22–32)
CREATININE: 20.23 mg/dL — AB (ref 0.61–1.24)
GFR calc Af Amer: 3 mL/min — ABNORMAL LOW (ref >60)
GFR, EST NON AFRICAN AMERICAN: 2 mL/min — AB (ref >60)
GLUCOSE: 78 mg/dL (ref 65–99)
POTASSIUM: 4.3 mmol/L (ref 3.5–5.1)
Sodium: 148 mmol/L — ABNORMAL HIGH (ref 135–145)

## 2014-06-23 LAB — CBC
HCT: 34 % — ABNORMAL LOW (ref 40.0–52.0)
HEMOGLOBIN: 10.7 g/dL — AB (ref 13.0–18.0)
MCH: 29.9 pg (ref 26.0–34.0)
MCHC: 31.4 g/dL — AB (ref 32.0–36.0)
MCV: 95.1 fL (ref 80.0–100.0)
PLATELETS: 383 10*3/uL (ref 150–440)
RBC: 3.58 MIL/uL — ABNORMAL LOW (ref 4.40–5.90)
RDW: 18.4 % — ABNORMAL HIGH (ref 11.5–14.5)
WBC: 9.6 10*3/uL (ref 3.8–10.6)

## 2014-06-23 LAB — TYPE AND SCREEN
ABO/RH(D): O POS
Antibody Screen: NEGATIVE

## 2014-06-23 LAB — ABO/RH: ABO/RH(D): O POS

## 2014-06-23 NOTE — Patient Instructions (Addendum)
  Your procedure is scheduled on: Thursday 06/26/14 Report to Day Surgery. Medical mall Entrance To find out your arrival time please call [phone] between 1PM - 3PM on Wednesday 06/25/14.  Remember: Instructions that are not followed completely may result in serious medical risk, up to and including death, or upon the discretion of your surgeon and anesthesiologist your surgery may need to be rescheduled.    __x__ 1. Do not eat food or drink liquids after midnight. No gum chewing or hard candies.     __x__ 2. No Alcohol for 24 hours before or after surgery.   ____ 3. Bring all medications with you on the day of surgery if instructed.    __x__ 4. Notify your doctor if there is any change in your medical condition     (cold, fever, infections).     Do not wear jewelry, make-up, hairpins, clips or nail polish.  Do not wear lotions, powders, or perfumes. You may wear deodorant.  Do not shave 48 hours prior to surgery. Men may shave face and neck.  Do not bring valuables to the hospital.    [facility] is not responsible for any belongings or valuables.               Contacts, dentures or bridgework may not be worn into surgery.  Leave your suitcase in the car. After surgery it may be brought to your room.  For patients admitted to the hospital, discharge time is determined by your                treatment team.   Patients discharged the day of surgery will not be allowed to drive home.   Please read over the following fact sheets that you were given:   Surgical Site Infection Prevention   _x___ Take these medicines the morning of surgery with A SIP OF WATER:    1. lexapro  2.   3.   4.  5.  6.  ____ Fleet Enema (as directed)   __x__ Use CHG Soap as directed  ____ Use inhalers on the day of surgery  ____ Stop metformin 2 days prior to surgery    ____ Take 1/2 of usual insulin dose the night before surgery and none on the morning of surgery.   ____ Stop  Coumadin/Plavix/aspirin on   ____ Stop Anti-inflammatories on     ____ Stop supplements until after surgery.    ____ Bring C-Pap to the hospital.

## 2014-06-23 NOTE — OR Nursing (Signed)
Hemogram, Met B results faxed to Dr. Lucky Cowboy

## 2014-06-26 ENCOUNTER — Ambulatory Visit: Payer: Medicare Other | Admitting: Anesthesiology

## 2014-06-26 ENCOUNTER — Encounter: Payer: Self-pay | Admitting: *Deleted

## 2014-06-26 ENCOUNTER — Ambulatory Visit
Admission: RE | Admit: 2014-06-26 | Discharge: 2014-06-26 | Disposition: A | Discharge status: Home / Self Care | Payer: Medicare Other | Source: Ambulatory Visit | Attending: Vascular Surgery | Admitting: Vascular Surgery

## 2014-06-26 ENCOUNTER — Encounter
Admission: RE | Disposition: A | Discharge status: Home / Self Care | Payer: Self-pay | Source: Ambulatory Visit | Attending: Vascular Surgery

## 2014-06-26 DIAGNOSIS — I10 Essential (primary) hypertension: Secondary | ICD-10-CM | POA: Diagnosis not present

## 2014-06-26 DIAGNOSIS — Z8249 Family history of ischemic heart disease and other diseases of the circulatory system: Secondary | ICD-10-CM | POA: Insufficient documentation

## 2014-06-26 DIAGNOSIS — M542 Cervicalgia: Secondary | ICD-10-CM | POA: Insufficient documentation

## 2014-06-26 DIAGNOSIS — F17200 Nicotine dependence, unspecified, uncomplicated: Secondary | ICD-10-CM | POA: Insufficient documentation

## 2014-06-26 DIAGNOSIS — Z79899 Other long term (current) drug therapy: Secondary | ICD-10-CM | POA: Insufficient documentation

## 2014-06-26 DIAGNOSIS — N186 End stage renal disease: Secondary | ICD-10-CM | POA: Diagnosis not present

## 2014-06-26 DIAGNOSIS — I120 Hypertensive chronic kidney disease with stage 5 chronic kidney disease or end stage renal disease: Secondary | ICD-10-CM | POA: Insufficient documentation

## 2014-06-26 DIAGNOSIS — K219 Gastro-esophageal reflux disease without esophagitis: Secondary | ICD-10-CM | POA: Diagnosis not present

## 2014-06-26 DIAGNOSIS — Z7901 Long term (current) use of anticoagulants: Secondary | ICD-10-CM | POA: Insufficient documentation

## 2014-06-26 DIAGNOSIS — D649 Anemia, unspecified: Secondary | ICD-10-CM | POA: Diagnosis not present

## 2014-06-26 HISTORY — PX: AV FISTULA PLACEMENT: SHX1204

## 2014-06-26 SURGERY — ARTERIOVENOUS (AV) FISTULA CREATION
Anesthesia: General | Laterality: Left | Wound class: Clean

## 2014-06-26 MED ORDER — LIDOCAINE HCL (CARDIAC) 20 MG/ML IV SOLN
INTRAVENOUS | Status: DC | PRN
  Administered 2014-06-26: 60 mg via INTRAVENOUS

## 2014-06-26 MED ORDER — BUPIVACAINE-EPINEPHRINE (PF) 0.5% -1:200000 IJ SOLN
INTRAMUSCULAR | Status: AC
  Filled 2014-06-26: qty 30

## 2014-06-26 MED ORDER — CEFAZOLIN SODIUM 1-5 GM-% IV SOLN
INTRAVENOUS | Status: AC
  Administered 2014-06-26: 1 g via INTRAVENOUS
  Filled 2014-06-26: qty 50

## 2014-06-26 MED ORDER — FENTANYL CITRATE (PF) 100 MCG/2ML IJ SOLN
INTRAMUSCULAR | Status: DC | PRN
  Administered 2014-06-26: 50 ug via INTRAVENOUS
  Administered 2014-06-26: 100 ug via INTRAVENOUS

## 2014-06-26 MED ORDER — SODIUM CHLORIDE 0.9 % IV SOLN
10000.0000 ug | INTRAVENOUS | Status: DC | PRN
  Administered 2014-06-26: 60 ug/min via INTRAVENOUS

## 2014-06-26 MED ORDER — MIDAZOLAM HCL 2 MG/2ML IJ SOLN
INTRAMUSCULAR | Status: DC | PRN
  Administered 2014-06-26: 2 mg via INTRAVENOUS

## 2014-06-26 MED ORDER — CEFAZOLIN SODIUM 1-5 GM-% IV SOLN
1.0000 g | Freq: Once | INTRAVENOUS | Status: AC
  Administered 2014-06-26: 1 g via INTRAVENOUS

## 2014-06-26 MED ORDER — FENTANYL CITRATE (PF) 100 MCG/2ML IJ SOLN
25.0000 ug | INTRAMUSCULAR | Status: DC | PRN
  Administered 2014-06-26 (×2): 25 ug via INTRAVENOUS

## 2014-06-26 MED ORDER — HYDROCODONE-ACETAMINOPHEN 5-325 MG PO TABS: 1.0000 | ORAL_TABLET | Freq: Four times a day (QID) | ORAL | Status: DC | PRN

## 2014-06-26 MED ORDER — PROPOFOL 10 MG/ML IV BOLUS
INTRAVENOUS | Status: DC | PRN
  Administered 2014-06-26: 200 mg via INTRAVENOUS

## 2014-06-26 MED ORDER — HEPARIN SODIUM (PORCINE) 1000 UNIT/ML IJ SOLN: INTRAMUSCULAR | Status: DC | PRN

## 2014-06-26 MED ORDER — ONDANSETRON HCL 4 MG/2ML IJ SOLN: 4.0000 mg | Freq: Once | INTRAMUSCULAR | Status: DC | PRN

## 2014-06-26 MED ORDER — SUCCINYLCHOLINE CHLORIDE 20 MG/ML IJ SOLN
INTRAMUSCULAR | Status: DC | PRN
  Administered 2014-06-26: 100 mg via INTRAVENOUS

## 2014-06-26 MED ORDER — SODIUM CHLORIDE 0.9 % IV SOLN
INTRAVENOUS | Status: DC
  Administered 2014-06-26 (×2): via INTRAVENOUS

## 2014-06-26 MED ORDER — HEPARIN SODIUM (PORCINE) 5000 UNIT/ML IJ SOLN
INTRAMUSCULAR | Status: AC
  Filled 2014-06-26: qty 1

## 2014-06-26 MED ORDER — ROCURONIUM BROMIDE 100 MG/10ML IV SOLN
INTRAVENOUS | Status: DC | PRN
  Administered 2014-06-26: 5 mg via INTRAVENOUS

## 2014-06-26 MED ORDER — IPRATROPIUM-ALBUTEROL 0.5-2.5 (3) MG/3ML IN SOLN
RESPIRATORY_TRACT | Status: AC
  Administered 2014-06-26: 3 mL via RESPIRATORY_TRACT
  Filled 2014-06-26: qty 3

## 2014-06-26 MED ORDER — PHENYLEPHRINE HCL 10 MG/ML IJ SOLN
INTRAMUSCULAR | Status: DC | PRN
  Administered 2014-06-26 (×2): 200 ug via INTRAVENOUS
  Administered 2014-06-26: 100 ug via INTRAVENOUS
  Administered 2014-06-26: 300 ug via INTRAVENOUS
  Administered 2014-06-26: 200 ug via INTRAVENOUS

## 2014-06-26 MED ORDER — IPRATROPIUM-ALBUTEROL 0.5-2.5 (3) MG/3ML IN SOLN
3.0000 mL | RESPIRATORY_TRACT | Status: DC
  Administered 2014-06-26: 3 mL via RESPIRATORY_TRACT

## 2014-06-26 MED ORDER — ONDANSETRON HCL 4 MG/2ML IJ SOLN
INTRAMUSCULAR | Status: DC | PRN
  Administered 2014-06-26: 4 mg via INTRAVENOUS

## 2014-06-26 MED ORDER — PAPAVERINE HCL 30 MG/ML IJ SOLN
INTRAMUSCULAR | Status: AC
  Filled 2014-06-26: qty 2

## 2014-06-26 MED ORDER — FENTANYL CITRATE (PF) 100 MCG/2ML IJ SOLN
INTRAMUSCULAR | Status: AC
  Administered 2014-06-26: 25 ug via INTRAVENOUS
  Filled 2014-06-26: qty 2

## 2014-06-26 MED ORDER — HEPARIN SODIUM (PORCINE) 1000 UNIT/ML IJ SOLN
INTRAMUSCULAR | Status: DC | PRN
  Administered 2014-06-26: 2000 [IU] via INTRAVENOUS
  Administered 2014-06-26: 4000 [IU] via INTRAVENOUS

## 2014-06-26 SURGICAL SUPPLY — 52 items
BAG DECANTER STRL (MISCELLANEOUS) ×3 IMPLANT
BLADE SURG SZ11 CARB STEEL (BLADE) ×3 IMPLANT
BOOT SUTURE AID YELLOW STND (SUTURE) ×3 IMPLANT
BRUSH SCRUB 4% CHG (MISCELLANEOUS) ×3 IMPLANT
CANISTER SUCT 1200ML W/VALVE (MISCELLANEOUS) ×3 IMPLANT
CHLORAPREP W/TINT 26ML (MISCELLANEOUS) ×3 IMPLANT
CLIP SPRNG 6MM S-JAW DBL (CLIP) ×3
ELECT CAUTERY BLADE 6.4 (BLADE) ×3 IMPLANT
EVICEL 2ML SEALANT HUMAN (Miscellaneous) ×3 IMPLANT
GEL ULTRASOUND 20GR AQUASONIC (MISCELLANEOUS) IMPLANT
GLOVE BIO SURGEON STRL SZ7 (GLOVE) ×24 IMPLANT
GOWN STRL REUS W/ TWL LRG LVL3 (GOWN DISPOSABLE) ×3 IMPLANT
GOWN STRL REUS W/ TWL XL LVL3 (GOWN DISPOSABLE) ×1 IMPLANT
GOWN STRL REUS W/TWL LRG LVL3 (GOWN DISPOSABLE) ×6
GOWN STRL REUS W/TWL XL LVL3 (GOWN DISPOSABLE) ×2
HEMOSTAT SURGICEL 2X3 (HEMOSTASIS) ×3 IMPLANT
IV NS 500ML (IV SOLUTION) ×2
IV NS 500ML BAXH (IV SOLUTION) ×1 IMPLANT
KIT RM TURNOVER STRD PROC AR (KITS) ×3 IMPLANT
LABEL OR SOLS (LABEL) ×3 IMPLANT
LIQUID BAND (GAUZE/BANDAGES/DRESSINGS) ×3 IMPLANT
LOOP RED MAXI  1X406MM (MISCELLANEOUS) ×2
LOOP VESSEL MAXI 1X406 RED (MISCELLANEOUS) ×1 IMPLANT
LOOP VESSEL MINI 0.8X406 BLUE (MISCELLANEOUS) ×1 IMPLANT
LOOPS BLUE MINI 0.8X406MM (MISCELLANEOUS) ×2
NEEDLE FILTER BLUNT 18X 1/2SAF (NEEDLE) ×2
NEEDLE FILTER BLUNT 18X1 1/2 (NEEDLE) ×1 IMPLANT
NEEDLE HYPO 30X.5 LL (NEEDLE) IMPLANT
NS IRRIG 500ML POUR BTL (IV SOLUTION) ×3 IMPLANT
PACK EXTREMITY ARMC (MISCELLANEOUS) ×3 IMPLANT
PAD GROUND ADULT SPLIT (MISCELLANEOUS) ×3 IMPLANT
PAD PREP 24X41 OB/GYN DISP (PERSONAL CARE ITEMS) ×3 IMPLANT
SOLUTION CELL SAVER (CLIP) ×1 IMPLANT
SPONGE XRAY 4X4 16PLY STRL (MISCELLANEOUS) ×3 IMPLANT
STOCKINETTE STRL 4IN 9604848 (GAUZE/BANDAGES/DRESSINGS) IMPLANT
STOCKINETTE STRL 6IN 960660 (GAUZE/BANDAGES/DRESSINGS) ×3 IMPLANT
SUT GTX CV-6 30 (SUTURE) IMPLANT
SUT MNCRL AB 4-0 PS2 18 (SUTURE) ×3 IMPLANT
SUT PROLENE 6 0 BV (SUTURE) ×9 IMPLANT
SUT SILK 2 0 (SUTURE) ×2
SUT SILK 2 0 SH (SUTURE) ×3 IMPLANT
SUT SILK 2-0 18XBRD TIE 12 (SUTURE) ×1 IMPLANT
SUT SILK 3 0 (SUTURE) ×2
SUT SILK 3-0 18XBRD TIE 12 (SUTURE) ×1 IMPLANT
SUT SILK 4 0 (SUTURE) ×2
SUT SILK 4-0 18XBRD TIE 12 (SUTURE) ×1 IMPLANT
SUT VIC AB 3-0 SH 27 (SUTURE) ×4
SUT VIC AB 3-0 SH 27X BRD (SUTURE) ×2 IMPLANT
SYR 20CC LL (SYRINGE) ×3 IMPLANT
SYR 3ML LL SCALE MARK (SYRINGE) ×3 IMPLANT
SYR TB 1ML 27GX1/2 LL (SYRINGE) IMPLANT
TOWEL OR 17X26 4PK STRL BLUE (TOWEL DISPOSABLE) IMPLANT

## 2014-06-26 NOTE — Anesthesia Preprocedure Evaluation (Signed)
Anesthesia Evaluation  Patient identified by MRN, date of birth, ID band Patient awake    Reviewed: Allergy & Precautions, NPO status , Patient's Chart, lab work & pertinent test results  Airway Mallampati: II  TM Distance: >3 FB Neck ROM: Full    Dental  (+) Missing, Chipped   Pulmonary sleep apnea and Continuous Positive Airway Pressure Ventilation , Current Smoker (1/2 ppd),          Cardiovascular hypertension (off meds after dialysis), Pt. 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),     GI/Hepatic GERD- (s/p Gastric sleeve)  ,  Endo/Other    Renal/GU Dialysis and ESRFRenal disease     Musculoskeletal   Abdominal   Peds  Hematology  (+) anemia ,   Anesthesia Other Findings   Reproductive/Obstetrics                             Anesthesia Physical Anesthesia Plan  ASA: IV  Anesthesia Plan: General   Post-op Pain Management:    Induction:   Airway Management Planned: Oral ETT  Additional Equipment:   Intra-op Plan:   Post-operative Plan:   Informed Consent: I have reviewed the patients History and Physical, chart, labs and discussed the procedure including the risks, benefits and alternatives for the proposed anesthesia with the patient or authorized representative who has indicated his/her understanding and acceptance.     Plan Discussed with:   Anesthesia Plan Comments:         Anesthesia Quick Evaluation

## 2014-06-26 NOTE — Op Note (Signed)
[facility]   OPERATIVE NOTE   PROCEDURE: 1. Left arm first stage brachiobasilic arteriovenous fistula placement  PRE-OPERATIVE DIAGNOSIS: ESRD  POST-OPERATIVE DIAGNOSIS:ESRD  SURGEON: Leotis Pain, MD  ASSISTANT(S): Hezzie Bump, PA-C  ANESTHESIA: general  ESTIMATED BLOOD LOSS: 50 cc  FINDING(S): 1. Cephalic vein thrickened and no flow when initially used for fistula creation, basilic vein patent  SPECIMEN(S):  None  INDICATIONS:   Perry [name] is a 44 y.o. male who presents with end-stage renal disease.  The patient is scheduled for left arm fistula creation.  The patient is aware the risks include but are not limited to: bleeding, infection, steal syndrome, nerve damage, ischemic monomelic neuropathy, failure to mature, and need for additional procedures.  The patient is aware of the risks of the procedure and elects to proceed forward.  DESCRIPTION: After full informed written consent was obtained from the patient, the patient was brought back to the operating room and placed supine upon the operating table.  Prior to induction, the patient received IV antibiotics.   After obtaining adequate anesthesia, the patient was then prepped and draped in the standard fashion for a left arm access procedure.  I turned my attention first to identifying the patient's cephalic vein and brachial artery.   I made an incision at the level of the antecubital fossa and dissected through the subcutaneous tissue and fascia to gain exposure of the brachial artery.  This was noted to be suitable for fistula creation.  This was dissected out proximally and distally and controlled with vessel loops.  I then dissected out the cephalic vein. Although the vein was reasonably large, it was quite thickened and I passed dilators to try to assess this for fistula creation. The vein was marked for orientation. The patient was heparinized with 4000 units of intravenous heparin. Was ligated distally. I was able to pass  up to a 3 mm dilator but some resistance was met particularly initially. When ahead and plan to create an anastomosis with this vein. Control was pulled up with vessel loops on the artery. The vein was then cut and beveled to match the arteriotomy. An anastomosis was created with a running 6-0 Prolene suture flushing the vessel prior to release of control. On release, there was flow for the first 3 or 4 cm into the cephalic vein, then flow stopped. I dissected out the cephalic vein over several centimeters and this was very thickened and appeared occluded at this level. I then controlled the vein proximally and distally and tried to open the vein, thinking I might be able to patch this and make a usable fistula. When the vein was opened, there was no functional lumen and I did not feel that this was going to be a salvageable situation. The vein was then ligated proximally and distally with 2-0 silk stick ties with the hood of the cephalic vein left on the artery initially. At this point, I had to look for other access options and considered AV graft placement or brachiobasilic AV fistula. I extended the incision medially to evaluate the basilic vein which appeared to be patent and of reasonable size for fistula creation.  I then dissected out the basilic vein.  This was noted to be suitable for fistula creation. The patient was then heparinized again with another 2000 units of intravenous heparin..  The vein was marked for orientation and distal segment of the vein was ligated with a  2-0 silk, and the vein was transected.  I then instilled  the heparinized saline into the vein and clamped it.  At this point, I reset my exposure of the brachial artery and placed the artery under tension proximally and distally.  I then removed the hood of the cephalic vein off of the arteriotomy, and used the same arteriotomy for my anastomosis.  I then irrigated the artery with heparinized saline.  The vein was cut and beveled to  appropriate length to match the arteriotomy.  The vein was then sewn to the artery in an end-to-side configuration with a running 6-0 Prolene suture.  Prior to completing this anastomosis, I allowed the vein and artery to backbleed.  There was no evidence of clot from any vessels.  I completed the anastomosis in the usual fashion and then released all vessel loops and clamps. A single 6-0 Prolene patch suture was used for hemostasis.  There was a palpable  thrill in the venous outflow, and there was a palpable radial pulse.  At this point, I irrigated out the surgical wound and placed Surgicel and Evicel.  There was no further active bleeding.  The subcutaneous tissue was reapproximated with a running stitch of 3-0 Vicryl.  The skin was then reapproximated with a running subcuticular stitch of 4-0 Monocryl.  The skin was then cleaned, dried, and reinforced with Dermabond.  The patient tolerated this procedure well and was taken to the recovery room in stable condition  COMPLICATIONS: None  CONDITION: Stable  DEW,JASON  06/26/2014, 2:52 PM

## 2014-06-26 NOTE — OR Nursing (Signed)
5000 units Heparin/500 ml NaCl- used 60 ml as flush for fistula

## 2014-06-26 NOTE — Anesthesia Procedure Notes (Signed)
Procedure Name: Intubation Date/Time: 06/26/2014 12:32 PM Performed by: Lily Kocher Pre-anesthesia Checklist: Patient identified Patient Re-evaluated:Patient Re-evaluated prior to inductionOxygen Delivery Method: Circle system utilized Preoxygenation: Pre-oxygenation with 100% oxygen Intubation Type: IV induction Ventilation: Mask ventilation without difficulty Laryngoscope Size: Mac and 4 Grade View: Grade II Tube type: Oral Tube size: 7.5 mm Number of attempts: 1 Airway Equipment and Method: Stylet and Patient positioned with wedge pillow (pt ramped up with pillows; removed after intubation) Placement Confirmation: positive ETCO2 and breath sounds checked- equal and bilateral Secured at: 23 cm Tube secured with: Tape Dental Injury: Teeth and Oropharynx as per pre-operative assessment

## 2014-06-26 NOTE — Transfer of Care (Signed)
Immediate Anesthesia Transfer of Care Note  Patient: Perry [name]  Procedure(s) Performed: Procedure(s): Left arm AV fistula creation (Left)  Patient Location: PACU  Anesthesia Type:General  Level of Consciousness: sedated  Airway & Oxygen Therapy: Patient Spontanous Breathing and Patient connected to face mask oxygen  Post-op Assessment: Report given to RN  Post vital signs: Reviewed and stable  Last Vitals:  Filed Vitals:   06/26/14 1515  BP: 125/69  Pulse: 97  Temp: 36.5 C  Resp: 22    Complications: No apparent anesthesia complications

## 2014-06-26 NOTE — Discharge Instructions (Signed)
No heavy lifting for 5 days. Call with fever >101, wound redness or drainage, severe pain, or other issues   AMBULATORY SURGERY  DISCHARGE INSTRUCTIONS   1) The drugs that you were given will stay in your system until tomorrow so for the next 24 hours you should not:  A) Drive an automobile B) Make any legal decisions C) Drink any alcoholic beverage   2) You may resume regular meals tomorrow.  Today it is better to start with liquids and gradually work up to solid foods.  You may eat anything you prefer, but it is better to start with liquids, then soup and crackers, and gradually work up to solid foods.   3) Please notify your doctor immediately if you have any unusual bleeding, trouble breathing, redness and pain at the surgery site, drainage, fever, or pain not relieved by medication.  4) Your post-operative visit with Dr.                                     is: Date:                        Time:    Please call to schedule your post-operative visit.  5) Additional Instructions:   AV Fistula, Care After Refer to this sheet in the next few weeks. These instructions provide you with information on caring for yourself after your procedure. Your caregiver may also give you more specific instructions. Your treatment has been planned according to current medical practices, but problems sometimes occur. Call your caregiver if you have any problems or questions after your procedure. HOME CARE INSTRUCTIONS   Do not drive a car or take public transportation alone.  Do not drink alcohol.  Only take medicine that has been prescribed by your caregiver.  Do not sign important papers or make important decisions.  Have a responsible person with you.  Ask your caregiver to show you how to check your access at home for a vibration (called a "thrill") or for a sound (called a "bruit" pronounced brew-ee).  Your vein will need time to enlarge and mature so needles can be inserted for  dialysis. Follow your caregiver's instructions about what you need to do to make this happen.  Keep dressings clean and dry.  Keep the arm elevated above your heart. Use a pillow.  Rest.  Use the arm as usual for all activities.  Have the stitches or tape closures removed in 10 to 14 days, or as directed by your caregiver.  Do not sleep or lie on the area of the fistula or that arm. This may decrease or stop the blood flow through your fistula.  Do not allow blood pressures to be taken on this arm.  Do not allow blood drawing to be done from the graft.  Do not wear tight clothing around the access site or on the arm.  Avoid lifting heavy objects with the arm that has the fistula.  Do not use creams or lotions over the access site. SEEK MEDICAL CARE IF:   You have a fever.  You have swelling around the fistula that gets worse, or you have new pain.  You have unusual bleeding at the fistula site or from any other area.  You have pus or other drainage at the fistula site.  You have skin redness or red streaking on the skin around,  above, or below the fistula site.  Your access site feels warm.  You have any flu-like symptoms. SEEK IMMEDIATE MEDICAL CARE IF:   You have pain, numbness, or an unusual pale skin on the hand or on the side of your fistula.  You have dizziness or weakness that you have not had before.  You have shortness of breath.  You have chest pain.  Your fistula disconnects or breaks, and there is bleeding that cannot be easily controlled. Call for local emergency medical help. Do not try to drive yourself to the hospital. MAKE SURE YOU  Understand these instructions.  Will watch your condition.  Will get help right away if you are not doing well or get worse. Document Released: 01/03/2005 Document Revised: 05/20/2013 Document Reviewed: 06/23/2010 [facility] Patient Information 2015 [place], [place]. This information is not intended to replace advice  given to you by your health care provider. Make sure you discuss any questions you have with your health care provider.

## 2014-06-26 NOTE — H&P (Signed)
[facility] History & Physical Update  The patient was interviewed and re-examined.  The patient's previous History and Physical has been reviewed and is unchanged.  There is no change in the plan of care. Lungs Clear bilaterally and equal  Ancel Easler, MD  06/26/2014, 12:10 PM

## 2014-06-26 NOTE — Anesthesia Postprocedure Evaluation (Signed)
  Anesthesia Post-op Note  Patient: Perry [name]  Procedure(s) Performed: Procedure(s): Left arm AV fistula creation (Left)  Anesthesia type:General  Patient location: PACU  Post pain: Pain level controlled  Post assessment: Post-op Vital signs reviewed, Patient's Cardiovascular Status Stable, Respiratory Function Stable, Patent Airway and No signs of Nausea or vomiting  Post vital signs: Reviewed and stable  Last Vitals:  Filed Vitals:   06/26/14 1515  BP: 125/69  Pulse: 97  Temp: 36.5 C  Resp: 22    Level of consciousness: awake, alert  and patient cooperative  Complications: No apparent anesthesia complications

## 2014-06-27 ENCOUNTER — Emergency Department (HOSPITAL_COMMUNITY)
Admission: EM | Admit: 2014-06-27 | Discharge: 2014-06-27 | Disposition: A | Discharge status: Home / Self Care | Payer: Medicare Other | Attending: Emergency Medicine | Admitting: Emergency Medicine

## 2014-06-27 ENCOUNTER — Encounter (HOSPITAL_COMMUNITY): Payer: Self-pay | Admitting: Emergency Medicine

## 2014-06-27 DIAGNOSIS — Z8739 Personal history of other diseases of the musculoskeletal system and connective tissue: Secondary | ICD-10-CM | POA: Diagnosis not present

## 2014-06-27 DIAGNOSIS — E875 Hyperkalemia: Secondary | ICD-10-CM | POA: Diagnosis not present

## 2014-06-27 DIAGNOSIS — N186 End stage renal disease: Secondary | ICD-10-CM | POA: Insufficient documentation

## 2014-06-27 DIAGNOSIS — G4730 Sleep apnea, unspecified: Secondary | ICD-10-CM | POA: Diagnosis not present

## 2014-06-27 DIAGNOSIS — F419 Anxiety disorder, unspecified: Secondary | ICD-10-CM | POA: Diagnosis not present

## 2014-06-27 DIAGNOSIS — I6789 Other cerebrovascular disease: Secondary | ICD-10-CM | POA: Diagnosis not present

## 2014-06-27 DIAGNOSIS — R251 Tremor, unspecified: Secondary | ICD-10-CM

## 2014-06-27 DIAGNOSIS — R9431 Abnormal electrocardiogram [ECG] [EKG]: Secondary | ICD-10-CM | POA: Diagnosis not present

## 2014-06-27 DIAGNOSIS — Z8719 Personal history of other diseases of the digestive system: Secondary | ICD-10-CM | POA: Diagnosis not present

## 2014-06-27 DIAGNOSIS — Z7982 Long term (current) use of aspirin: Secondary | ICD-10-CM | POA: Diagnosis not present

## 2014-06-27 DIAGNOSIS — G8929 Other chronic pain: Secondary | ICD-10-CM | POA: Insufficient documentation

## 2014-06-27 DIAGNOSIS — Z87891 Personal history of nicotine dependence: Secondary | ICD-10-CM | POA: Insufficient documentation

## 2014-06-27 DIAGNOSIS — Z992 Dependence on renal dialysis: Secondary | ICD-10-CM | POA: Insufficient documentation

## 2014-06-27 DIAGNOSIS — Z79899 Other long term (current) drug therapy: Secondary | ICD-10-CM | POA: Diagnosis not present

## 2014-06-27 LAB — COMPREHENSIVE METABOLIC PANEL
ALK PHOS: 68 U/L (ref 38–126)
ALT: 11 U/L — AB (ref 17–63)
AST: 17 U/L (ref 15–41)
Albumin: 2.8 g/dL — ABNORMAL LOW (ref 3.5–5.0)
Anion gap: 26 — ABNORMAL HIGH (ref 5–15)
BUN: 107 mg/dL — ABNORMAL HIGH (ref 6–20)
CO2: 29 mmol/L (ref 22–32)
Calcium: 7.6 mg/dL — ABNORMAL LOW (ref 8.9–10.3)
Chloride: 89 mmol/L — ABNORMAL LOW (ref 101–111)
Creatinine, Ser: 20.93 mg/dL — ABNORMAL HIGH (ref 0.61–1.24)
GFR calc Af Amer: 3 mL/min — ABNORMAL LOW (ref >60)
GFR, EST NON AFRICAN AMERICAN: 2 mL/min — AB (ref >60)
Glucose, Bld: 96 mg/dL (ref 65–99)
Potassium: 5.2 mmol/L — ABNORMAL HIGH (ref 3.5–5.1)
Sodium: 144 mmol/L (ref 135–145)
Total Bilirubin: 0.6 mg/dL (ref 0.3–1.2)
Total Protein: 6 g/dL — ABNORMAL LOW (ref 6.5–8.1)

## 2014-06-27 LAB — CBC WITH DIFFERENTIAL/PLATELET
Basophils Absolute: 0 10*3/uL (ref 0.0–0.1)
Basophils Relative: 0 % (ref 0–1)
Eosinophils Absolute: 0.3 10*3/uL (ref 0.0–0.7)
Eosinophils Relative: 3 % (ref 0–5)
HCT: 31.5 % — ABNORMAL LOW (ref 39.0–52.0)
HEMOGLOBIN: 9.6 g/dL — AB (ref 13.0–17.0)
LYMPHS ABS: 0.7 10*3/uL (ref 0.7–4.0)
LYMPHS PCT: 6 % — AB (ref 12–46)
MCH: 29.4 pg (ref 26.0–34.0)
MCHC: 30.5 g/dL (ref 30.0–36.0)
MCV: 96.6 fL (ref 78.0–100.0)
Monocytes Absolute: 0.6 10*3/uL (ref 0.1–1.0)
Monocytes Relative: 5 % (ref 3–12)
NEUTROS ABS: 9.5 10*3/uL — AB (ref 1.7–7.7)
NEUTROS PCT: 86 % — AB (ref 43–77)
Platelets: 409 10*3/uL — ABNORMAL HIGH (ref 150–400)
RBC: 3.26 MIL/uL — ABNORMAL LOW (ref 4.22–5.81)
RDW: 17.8 % — ABNORMAL HIGH (ref 11.5–15.5)
WBC: 11 10*3/uL — ABNORMAL HIGH (ref 4.0–10.5)

## 2014-06-27 LAB — MAGNESIUM: Magnesium: 2.8 mg/dL — ABNORMAL HIGH (ref 1.7–2.4)

## 2014-06-27 LAB — PHOSPHORUS: Phosphorus: 12.4 mg/dL — ABNORMAL HIGH (ref 2.5–4.6)

## 2014-06-27 MED ORDER — LORAZEPAM 0.5 MG PO TABS: 0.5000 mg | ORAL_TABLET | Freq: Three times a day (TID) | ORAL | Status: DC

## 2014-06-27 MED ORDER — LORAZEPAM 2 MG/ML IJ SOLN
1.0000 mg | Freq: Once | INTRAMUSCULAR | Status: AC
  Administered 2014-06-27: 1 mg via INTRAVENOUS
  Filled 2014-06-27: qty 1

## 2014-06-27 MED ORDER — LORAZEPAM 1 MG PO TABS
0.5000 mg | ORAL_TABLET | Freq: Once | ORAL | Status: AC
  Administered 2014-06-27: 0.5 mg via ORAL
  Filled 2014-06-27: qty 1

## 2014-06-27 NOTE — Discharge Instructions (Signed)
If you were given medicines take as directed.  If you are on coumadin or contraceptives realize their levels and effectiveness is altered by many different medicines.  If you have any reaction (rash, tongues swelling, other) to the medicines stop taking and see a physician.   Please make sure you dialyzed today. If he have difficulties with dialysis call your nephrologist or return to the ER. If your blood pressure was elevated in the ER make sure you follow up for management with a primary doctor or return for chest pain, shortness of breath or stroke symptoms.  Please follow up as directed and return to the ER or see a physician for new or worsening symptoms.  Thank you. Filed Vitals:   06/27/14 0858 06/27/14 0900 06/27/14 0930 06/27/14 0954  BP: 99/45 102/50 111/56 111/56  Pulse:    97  Temp:      TempSrc:      Resp: 17 17 25 22   Height:      Weight:      SpO2:    99%

## 2014-06-27 NOTE — ED Notes (Signed)
Pt from home via [facility] with c/o "jerking/twitching" starting this am.  Pt has a hx of the same "but not as bad" with high potassium levels.  Pt fell this am due to this uncontrolled twitch, denies any pain.  SOB present is abnormal, lungs clear.  12 lead unremarkable.  Pt had fistula surgery yesterday that was unsuccessful.  Potassium yesterday was 4.5 prior to surgery.  At home dialysis Mon, Tues, Thurs, Fri.  Pt in NAD, A&O.

## 2014-06-27 NOTE — ED Provider Notes (Signed)
CSN: [id number]     Arrival date & time 06/27/14  4098 History   First MD Initiated Contact with Patient 06/27/14 0755     Chief Complaint  Patient presents with  . Shaking     (Consider location/radiation/quality/duration/timing/severity/associated sxs/prior Treatment) HPI Comments: 44 year old male with history of end-stage renal disease dialyzes at home 5 days a week except Wednesday and Sunday, anemia, hyperparathyroidism, gastric sleeve history presents with intermittent shaking episodes becoming more frequent the past few days. Patient has had these in the past however they're now frequent enough to affect his daily lifestyle. Patient does not lose consciousness with them, patient was told he had seizures in the past. Patient is not on any specific seizure medications, does take Xanax. No other symptoms no fevers or chills. No significant headaches. No head injuries. These seem to improve when his active moving and distracted. Bilateral shaking worse in the arms. Patient had fistula attempts at outside hospital [facility] yesterday, unsuccessful however patient has follow-up outpatient for this. Left arm.  The history is provided by the patient.    Past Medical History  Diagnosis Date  . Dialysis patient   . Sleep apnea     cpap-   . Anxiety   . Kidney failure     on dialysis since 2011- [facility]- nephrologist   . GERD (gastroesophageal reflux disease)   . High blood pressure     hx of   . Acute meniscal tear of left knee   . Acute meniscal tear of right knee   . Chronic back pain     due to weight   . Hemodialysis patient     since 2011, does hemodialysis at home, wife does she is a [employer] boy , Daily except wed and sun   Past Surgical History  Procedure Laterality Date  . Av fistula repair    . Av fistula placement      left arm -   . Stents in left lower forearm       due to clotting in Av fistula   . Av fistula right wrist     . Av fistula right upper arm     .  Thrombectomies to left lower foreram fistula     . 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 Laparoscopic gastric sleeve resection N/A 05/05/2014    Procedure: LAPAROSCOPIC GASTRIC SLEEVE RESECTION;  Surgeon: Luretha Murphy, MD;  Location: [facility];  Service: General;  Laterality: N/A;  . Peripheral vascular catheterization N/A 06/02/2014    Procedure: A/V Shuntogram/Fistulagram;  Surgeon: Annice Needy, MD;  Location: [facility];  Service: Cardiovascular;  Laterality: N/A;  . Peripheral vascular catheterization N/A 06/02/2014    Procedure: A/V Shunt Intervention;  Surgeon: Annice Needy, MD;  Location: [facility];  Service: Cardiovascular;  Laterality: N/A;  . Peripheral vascular catheterization N/A 06/02/2014    Procedure: Dialysis/Perma Catheter Insertion;  Surgeon: Annice Needy, MD;  Location: [facility];  Service: Cardiovascular;  Laterality: N/A;  . Av fistula placement Left 06/26/2014    Procedure: Left arm AV fistula creation;  Surgeon: Annice Needy, MD;  Location: [facility];  Service: Vascular;  Laterality: Left;   Family History  Problem Relation Age of Onset  . High blood pressure Mother    History  Substance Use Topics  . Smoking status: Former Smoker -- 1.00 packs/day for 20 years    Types: Cigarettes  Quit date: 06/17/2013  . Smokeless tobacco: Never Used  . Alcohol Use: No    Review of Systems  Constitutional: Negative for fever and chills.  HENT: Negative for congestion.   Eyes: Negative for visual disturbance.  Respiratory: Negative for shortness of breath.   Cardiovascular: Negative for chest pain.  Gastrointestinal: Negative for vomiting and abdominal pain.  Genitourinary: Negative for dysuria and flank pain.  Musculoskeletal: Negative for back pain, neck pain and neck stiffness.  Skin: Negative for rash.  Neurological: Positive for tremors. Negative for light-headedness and headaches.      Allergies  Review of patient's allergies  indicates no known allergies.  Home Medications   Prior to Admission medications   Medication Sig Start Date End Date Taking? Authorizing Provider  acetaminophen (TYLENOL) 325 MG tablet Take 650 mg by mouth every 6 (six) hours as needed for mild pain or moderate pain.   Yes Historical Provider, MD  alprazolam (XANAX) 2 MG tablet TAKE ONE TABLET BY MOUTH AT BEDTIME AS NEEDED FOR SLEEP **REFILLS  MUST  BE  AT  LEAST  30  DAYS  APART** 04/29/14  Yes Donita Brooks, MD  apixaban (ELIQUIS) 2.5 MG TABS tablet Take 2.5 mg by mouth 2 (two) times daily.   Yes Historical Provider, MD  aspirin 81 MG tablet Take 81 mg by mouth daily.   Yes Historical Provider, MD  calcitRIOL (ROCALTROL) 0.25 MCG capsule Take 0.75 mcg by mouth 3 (three) times daily with meals.    Yes Historical Provider, MD  cinacalcet (SENSIPAR) 60 MG tablet Take 120 mg by mouth every evening.    Yes Historical Provider, MD  HYDROcodone-acetaminophen (NORCO) 5-325 MG per tablet Take 1 tablet by mouth every 6 (six) hours as needed for moderate pain. 06/26/14  Yes Annice Needy, MD  multivitamin (RENA-VIT) TABS tablet Take 1 tablet by mouth at bedtime.    Yes Historical Provider, MD  Oxycodone HCl 10 MG TABS Take 10 mg by mouth every 4 (four) hours as needed (for pain).    Yes Historical Provider, MD  sevelamer (RENVELA) 800 MG tablet Take 3,200 mg by mouth 3 (three) times daily with meals.    Yes Historical Provider, MD  diltiazem 2 % GEL Apply 1 application topically 3 (three) times daily. Patient not taking: Reported on 06/27/2014 10/18/13   Donita Brooks, MD  escitalopram (LEXAPRO) 10 MG tablet Take 1 tablet (10 mg total) by mouth daily. Patient not taking: Reported on 06/26/2014 06/09/14   Donita Brooks, MD  LORazepam (ATIVAN) 0.5 MG tablet Take 1 tablet (0.5 mg total) by mouth every 8 (eight) hours. 06/27/14   Blane Ohara, MD   BP 114/61 mmHg  Pulse 97  Temp(Src) 97.7 F (36.5 C) (Oral)  Resp 16  Ht 5\' 6"  (1.676 m)  Wt 292 lb  (132.45 kg)  BMI 47.15 kg/m2  SpO2 99% Physical Exam  Constitutional: He is oriented to person, place, and time. He appears well-developed and well-nourished.  HENT:  Head: Normocephalic and atraumatic.  Eyes: Conjunctivae are normal. Right eye exhibits no discharge. Left eye exhibits no discharge.  Neck: Normal range of motion. Neck supple. No tracheal deviation present.  Cardiovascular: Normal rate and regular rhythm.   Pulmonary/Chest: Effort normal and breath sounds normal.  Abdominal: Soft. He exhibits no distension. There is no tenderness. There is no guarding.  Musculoskeletal: He exhibits no edema.  Neurological: He is alert and oriented to person, place, and time. He has normal strength. GCS  eye subscore is 4. GCS verbal subscore is 5. GCS motor subscore is 6.  5+ strength in UE and LE with f/e at major joints. Sensation to palpation intact in UE and LE. CNs 2-12 grossly intact.  EOMFI.  PERRL.   Finger nose and coordination intact bilateral.   Visual fields intact to finger testing. Patient has intermittent sudden jerking episode bilateral worse in the arms twitching very brief. Improved with distraction and activity.   Skin: Skin is warm. No rash noted.  Patient has recent postop incision left before meals region, very mild erythema at the borders however no drainage no significant warmth and no streaking erythema.  Psychiatric: He has a normal mood and affect.  Nursing note and vitals reviewed.   ED Course  Procedures (including critical care time) Labs Review Labs Reviewed  COMPREHENSIVE METABOLIC PANEL - Abnormal; Notable for the following:    Potassium 5.2 (*)    Chloride 89 (*)    BUN 107 (*)    Creatinine, Ser 20.93 (*)    Calcium 7.6 (*)    Total Protein 6.0 (*)    Albumin 2.8 (*)    ALT 11 (*)    GFR calc non Af Amer 2 (*)    GFR calc Af Amer 3 (*)    Anion gap 26 (*)    All other components within normal limits  CBC WITH DIFFERENTIAL/PLATELET -  Abnormal; Notable for the following:    WBC 11.0 (*)    RBC 3.26 (*)    Hemoglobin 9.6 (*)    HCT 31.5 (*)    RDW 17.8 (*)    Platelets 409 (*)    Neutrophils Relative % 86 (*)    Neutro Abs 9.5 (*)    Lymphocytes Relative 6 (*)    All other components within normal limits  MAGNESIUM - Abnormal; Notable for the following:    Magnesium 2.8 (*)    All other components within normal limits  PHOSPHORUS - Abnormal; Notable for the following:    Phosphorus 12.4 (*)    All other components within normal limits    Imaging Review No results found.   EKG Interpretation   Date/Time:  Friday June 27 2014 07:57:54 EDT Ventricular Rate:  108 PR Interval:  161 QRS Duration: 77 QT Interval:  359 QTC Calculation: 481 R Axis:   37 Text Interpretation:  Sinus tachycardia Multiform ventricular premature  complexes Low voltage, extremity and precordial leads Nonspecific T  abnormalities, lateral leads Borderline prolonged QT interval Confirmed by  Urian Martenson  MD, Emmerson Taddei ([id number]) on 06/27/2014 4:57:42 PM      MDM   Final diagnoses:  ESRD (end stage renal disease) on dialysis  Hyperkalemia  Occasional tremors   Patient with significant kidney disease history presents with more frequent twitching/tremors. Patient overall well-appearing on exam, remainder of neurologic exam unremarkable. Plan for electrolyte, basic blood work, Ativan and reassessment. Patient has outpatient follow-up.\  Patient will have dialysis this afternoon, understands importance with mild hyperkalemia, no EKG changes. Majority of patient's blood work at baseline. Close outpatient follow-up. Patient's shaking resolved with Ativan.  Results and differential diagnosis were discussed with the patient/parent/guardian. Close follow up outpatient was discussed, comfortable with the plan.   Medications  LORazepam (ATIVAN) tablet 0.5 mg (0.5 mg Oral Given 06/27/14 0828)  LORazepam (ATIVAN) injection 1 mg (1 mg Intravenous Given  06/27/14 0857)    Filed Vitals:   06/27/14 0900 06/27/14 0930 06/27/14 0954 06/27/14 1000  BP: 102/50 111/56 111/56  114/61  Pulse:   97   Temp:      TempSrc:      Resp: Height:      Weight:      SpO2:   99%     Final diagnoses:  ESRD (end stage renal disease) on dialysis  Hyperkalemia  Occasional tremors      Blane Ohara, MD 06/27/14 1658

## 2014-06-30 ENCOUNTER — Ambulatory Visit (INDEPENDENT_AMBULATORY_CARE_PROVIDER_SITE_OTHER): Payer: Medicare Other | Admitting: Family Medicine

## 2014-06-30 ENCOUNTER — Encounter: Payer: Self-pay | Admitting: Family Medicine

## 2014-06-30 VITALS — BP 120/62 | HR 86 | Temp 98.4°F | Resp 20 | Ht 66.0 in | Wt 294.0 lb

## 2014-06-30 DIAGNOSIS — G253 Myoclonus: Secondary | ICD-10-CM | POA: Diagnosis not present

## 2014-06-30 DIAGNOSIS — N250 Renal osteodystrophy: Secondary | ICD-10-CM

## 2014-06-30 NOTE — Progress Notes (Signed)
Subjective:    Patient ID: Perry [name], male    DOB: Jul 14, 1970, 44 y.o.   MRN: [id number]  HPI Patient was recently seen in the emergency room for myoclonic jerks. He was twitching uncontrollably. These are basically spastic jerks in his arms and his legs. They were not shaking tremors like a seizure. Patient did not lose consciousness. He did not have bowel or bladder incontinence. He did not have a postictal phase. His arms and legs would simply twitch suddenly.  Of note, his labs were significant for a phosphate level of 12.4. Patient has end-stage renal disease on dialysis. He has not been taking his phosphate binder or his calcitriol. Past Medical History  Diagnosis Date  . Dialysis patient   . Sleep apnea     cpap-   . Anxiety   . Kidney failure     on dialysis since 2011- [facility]- nephrologist   . GERD (gastroesophageal reflux disease)   . High blood pressure     hx of   . Acute meniscal tear of left knee   . Acute meniscal tear of right knee   . Chronic back pain     due to weight   . Hemodialysis patient     since 2011, does hemodialysis at home, wife does she is a [employer] boy , Daily except wed and sun   Past Surgical History  Procedure Laterality Date  . Av fistula repair    . Av fistula placement      left arm -   . Stents in left lower forearm       due to clotting in Av fistula   . Av fistula right wrist     . Av fistula right upper arm     . Thrombectomies to left lower foreram fistula     . 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 Laparoscopic gastric sleeve resection N/A 05/05/2014    Procedure: LAPAROSCOPIC GASTRIC SLEEVE RESECTION;  Surgeon: Luretha Murphy, MD;  Location: [facility];  Service: General;  Laterality: N/A;  . Peripheral vascular catheterization N/A 06/02/2014    Procedure: A/V Shuntogram/Fistulagram;  Surgeon: Annice Needy, MD;  Location: [facility];  Service: Cardiovascular;  Laterality: N/A;  . Peripheral vascular  catheterization N/A 06/02/2014    Procedure: A/V Shunt Intervention;  Surgeon: Annice Needy, MD;  Location: [facility];  Service: Cardiovascular;  Laterality: N/A;  . Peripheral vascular catheterization N/A 06/02/2014    Procedure: Dialysis/Perma Catheter Insertion;  Surgeon: Annice Needy, MD;  Location: [facility];  Service: Cardiovascular;  Laterality: N/A;  . Av fistula placement Left 06/26/2014    Procedure: Left arm AV fistula creation;  Surgeon: Annice Needy, MD;  Location: [facility];  Service: Vascular;  Laterality: Left;   Current Outpatient Prescriptions on File Prior to Visit  Medication Sig Dispense Refill  . acetaminophen (TYLENOL) 325 MG tablet Take 650 mg by mouth every 6 (six) hours as needed for mild pain or moderate pain.    [name] alprazolam (XANAX) 2 MG tablet TAKE ONE TABLET BY MOUTH AT BEDTIME AS NEEDED FOR SLEEP **REFILLS  MUST  BE  AT  LEAST  30  DAYS  APART** 30 tablet 2  . apixaban (ELIQUIS) 2.5 MG TABS tablet Take 2.5 mg by mouth 2 (two) times daily.    [name] aspirin 81 MG tablet Take 81 mg by mouth daily.    [name]  calcitRIOL (ROCALTROL) 0.25 MCG capsule Take 0.75 mcg by mouth 3 (three) times daily with meals.     . cinacalcet (SENSIPAR) 60 MG tablet Take 120 mg by mouth every evening.     . escitalopram (LEXAPRO) 10 MG tablet Take 1 tablet (10 mg total) by mouth daily. 30 tablet 3  . HYDROcodone-acetaminophen (NORCO) 5-325 MG per tablet Take 1 tablet by mouth every 6 (six) hours as needed for moderate pain. 30 tablet 0  . LORazepam (ATIVAN) 0.5 MG tablet Take 1 tablet (0.5 mg total) by mouth every 8 (eight) hours. 10 tablet 0  . multivitamin (RENA-VIT) TABS tablet Take 1 tablet by mouth at bedtime.     . Oxycodone HCl 10 MG TABS Take 10 mg by mouth every 4 (four) hours as needed (for pain).     [name] sevelamer (RENVELA) 800 MG tablet Take 3,200 mg by mouth 3 (three) times daily with meals.      No current facility-administered medications on file prior to visit.   No  Known Allergies History   Social History  . Marital Status: Married    Spouse Name: N/A  . Number of Children: 2  . Years of Education: 13   Occupational History  .      Disabled   Social History Main Topics  . Smoking status: Former Smoker -- 1.00 packs/day for 20 years    Types: Cigarettes    Quit date: 06/17/2013  . Smokeless tobacco: Never Used  . Alcohol Use: No  . Drug Use: No  . Sexual Activity: Yes   Other Topics Concern  . Not on file   Social History Narrative   Patient lives home at home with his wife Hydrographic surveyor ). Patient is disabled.    Caffeine-one cup of coffee daily.   Right handed.      Review of Systems  All other systems reviewed and are negative.      Objective:   Physical Exam  Constitutional: He is oriented to person, place, and time.  Cardiovascular: Normal rate, regular rhythm and normal heart sounds.   Pulmonary/Chest: Effort normal and breath sounds normal. No respiratory distress. He has no wheezes. He has no rales.  Abdominal: Soft. Bowel sounds are normal. He exhibits no distension. There is no tenderness. There is no rebound and no guarding.  Neurological: He is alert and oriented to person, place, and time. He has normal reflexes. He displays normal reflexes. No cranial nerve deficit. He exhibits normal muscle tone. Coordination normal.  Vitals reviewed.         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  He is also not taking his sensipar.  I believe this is effectively making him hypocalcemia causing his myoclonic jerks. I explained to the patient that he needs to be compliant with both of these medications to help control his myoclonus. I want him to start them both back and let me recheck his labs later this week. Also want him to follow up with his renal doctor as planned. Patient states that he has  not been taking these 2 medications after his gastric bypass because he has not been eating much. 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

## 2014-07-01 ENCOUNTER — Ambulatory Visit: Payer: Self-pay | Admitting: Family Medicine

## 2014-07-03 ENCOUNTER — Encounter: Payer: Self-pay | Admitting: Family Medicine

## 2014-07-03 ENCOUNTER — Ambulatory Visit (INDEPENDENT_AMBULATORY_CARE_PROVIDER_SITE_OTHER): Payer: Medicare Other | Admitting: Family Medicine

## 2014-07-03 DIAGNOSIS — Z992 Dependence on renal dialysis: Secondary | ICD-10-CM

## 2014-07-03 DIAGNOSIS — N186 End stage renal disease: Secondary | ICD-10-CM

## 2014-07-03 NOTE — Progress Notes (Signed)
Subjective:    Patient ID: Perry [name], male    DOB: [birth year], 44 y.o.   MRN: [id number]  HPI 06/30/14 Patient was recently seen in the emergency room for myoclonic jerks. He was twitching uncontrollably. These are basically spastic jerks in his arms and his legs. They were not shaking tremors like a seizure. Patient did not lose consciousness. He did not have bowel or bladder incontinence. He did not have a postictal phase. His arms and legs would simply twitch suddenly.  Of note, his labs were significant for a phosphate level of 12.4. Patient has end-stage renal disease on dialysis. He has not been taking his phosphate binder or his calcitriol.  At that time, my plan was: I believe the patient has severe hyperphosphatemia secondary to his noncompliance with Sevelamer.  He is also not taking his sensipar.  I believe this is effectively making him hypocalcemia causing his myoclonic jerks. I explained to the patient that he needs to be compliant with both of these medications to help control his myoclonus. I want him to start them both back and let me recheck his labs later this week. Also want him to follow up with his renal doctor as planned. Patient states that he has not been taking these 2 medications after his gastric bypass because he has not been eating much. 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  07/03/14 Patient did resume calcitriol as well as Sevelamer.  He states that he is feeling better today. The myoclonic twitches in his muscles have improved. However today on examination he does continue to twitch particularly in his hands and in his forearms. He denies any difficulty breathing. He denies any chest palpitations or tachycardia or syncope. He is here today to recheck his blood work Past Medical History  Diagnosis Date  . Dialysis patient   . Sleep apnea     cpap-   .  Anxiety   . Kidney failure     on dialysis since 2011- DR [facility]- nephrologist   . GERD (gastroesophageal reflux disease)   . High blood pressure     hx of   . Acute meniscal tear of left knee   . Acute meniscal tear of right knee   . Chronic back pain     due to weight   . Hemodialysis patient     since 2011, does hemodialysis at home, wife does she is a [employer] boy , Daily except wed and sun   Past Surgical History  Procedure Laterality Date  . Av fistula repair    . Av fistula placement      left arm -   . Stents in left lower forearm       due to clotting in Av fistula   . Av fistula right wrist     . Av fistula right upper arm     . Thrombectomies to left lower foreram fistula     . 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 Laparoscopic gastric sleeve resection N/A 05/05/2014    Procedure: LAPAROSCOPIC GASTRIC SLEEVE RESECTION;  Surgeon: Luretha Murphy, MD;  Location: [facility];  Service: General;  Laterality: N/A;  . Peripheral vascular catheterization N/A 06/02/2014    Procedure: A/V Shuntogram/Fistulagram;  Surgeon: Annice Needy, MD;  Location: [facility];  Service: Cardiovascular;  Laterality: N/A;  .  Peripheral vascular catheterization N/A 06/02/2014    Procedure: A/V Shunt Intervention;  Surgeon: Annice Needy, MD;  Location: [facility];  Service: Cardiovascular;  Laterality: N/A;  . Peripheral vascular catheterization N/A 06/02/2014    Procedure: Dialysis/Perma Catheter Insertion;  Surgeon: Annice Needy, MD;  Location: [facility];  Service: Cardiovascular;  Laterality: N/A;  . Av fistula placement Left 06/26/2014    Procedure: Left arm AV fistula creation;  Surgeon: Annice Needy, MD;  Location: [facility];  Service: Vascular;  Laterality: Left;   Current Outpatient Prescriptions on File Prior to Visit  Medication Sig Dispense Refill  . acetaminophen (TYLENOL) 325 MG tablet Take 650 mg by mouth every 6 (six) hours as needed for mild pain or moderate  pain.    [name] alprazolam (XANAX) 2 MG tablet TAKE ONE TABLET BY MOUTH AT BEDTIME AS NEEDED FOR SLEEP **REFILLS  MUST  BE  AT  LEAST  30  DAYS  APART** 30 tablet 2  . apixaban (ELIQUIS) 2.5 MG TABS tablet Take 2.5 mg by mouth 2 (two) times daily.    [name] aspirin 81 MG tablet Take 81 mg by mouth daily.    . calcitRIOL (ROCALTROL) 0.25 MCG capsule Take 0.75 mcg by mouth 3 (three) times daily with meals.     . cinacalcet (SENSIPAR) 60 MG tablet Take 120 mg by mouth every evening.     . escitalopram (LEXAPRO) 10 MG tablet Take 1 tablet (10 mg total) by mouth daily. 30 tablet 3  . HYDROcodone-acetaminophen (NORCO) 5-325 MG per tablet Take 1 tablet by mouth every 6 (six) hours as needed for moderate pain. 30 tablet 0  . LORazepam (ATIVAN) 0.5 MG tablet Take 1 tablet (0.5 mg total) by mouth every 8 (eight) hours. 10 tablet 0  . multivitamin (RENA-VIT) TABS tablet Take 1 tablet by mouth at bedtime.     . Oxycodone HCl 10 MG TABS Take 10 mg by mouth every 4 (four) hours as needed (for pain).     [name] sevelamer (RENVELA) 800 MG tablet Take 3,200 mg by mouth 3 (three) times daily with meals.      No current facility-administered medications on file prior to visit.   No Known Allergies History   Social History  . Marital Status: Married    Spouse Name: N/A  . Number of Children: 2  . Years of Education: 13   Occupational History  .      Disabled   Social History Main Topics  . Smoking status: Former Smoker -- 1.00 packs/day for 20 years    Types: Cigarettes    Quit date: 06/17/2013  . Smokeless tobacco: Never Used  . Alcohol Use: No  . Drug Use: No  . Sexual Activity: Yes   Other Topics Concern  . Not on file   Social History Narrative   Patient lives home at home with his wife Hydrographic surveyor ). Patient is disabled.    Caffeine-one cup of coffee daily.   Right handed.      Review of Systems  All other systems reviewed and are negative.      Objective:   Physical Exam  Constitutional: He is  oriented to person, place, and time.  Cardiovascular: Normal rate, regular rhythm and normal heart sounds.   Pulmonary/Chest: Effort normal and breath sounds normal. No respiratory distress. He has no wheezes. He has no rales.  Abdominal: Soft. Bowel sounds are normal. He exhibits no distension. There is no tenderness. There  is no rebound and no guarding.  Neurological: He is alert and oriented to person, place, and time. He has normal reflexes. No cranial nerve deficit. He exhibits normal muscle tone. Coordination normal.  Vitals reviewed.         Assessment & Plan:  Hyperphosphatemia - Plan: COMPLETE METABOLIC PANEL WITH GFR, Phosphorus  ESRD on dialysis - Plan: COMPLETE METABOLIC PANEL WITH GFR, Phosphorus  I'll recheck a CMP as well as a phosphorus level. Depending upon the calcium phosphorus product, we will likely need to increase Sevelamer back to 4 tabs potid.  He could then have his blood rechecked if his renal appointment on June 21 with Dr. Marisue Humble.

## 2014-07-04 ENCOUNTER — Encounter: Payer: Self-pay | Admitting: Family Medicine

## 2014-07-04 LAB — COMPLETE METABOLIC PANEL WITH GFR
ALT: <8 U/L (ref 0–53)
AST: 41 U/L — ABNORMAL HIGH (ref 0–37)
Albumin: 3.4 g/dL — ABNORMAL LOW (ref 3.5–5.2)
Alkaline Phosphatase: 84 U/L (ref 39–117)
BUN: 45 mg/dL — AB (ref 6–23)
CALCIUM: 7.9 mg/dL — AB (ref 8.4–10.5)
CO2: 31 mEq/L (ref 19–32)
CREATININE: 12.78 mg/dL — AB (ref 0.50–1.35)
Chloride: 90 mEq/L — ABNORMAL LOW (ref 96–112)
GFR, EST AFRICAN AMERICAN: 5 mL/min — AB
GFR, EST NON AFRICAN AMERICAN: 4 mL/min — AB
Glucose, Bld: 76 mg/dL (ref 70–99)
Potassium: 4.4 mEq/L (ref 3.5–5.3)
Sodium: 139 mEq/L (ref 135–145)
Total Bilirubin: 0.2 mg/dL (ref 0.2–1.2)
Total Protein: 6.5 g/dL (ref 6.0–8.3)

## 2014-07-04 LAB — PHOSPHORUS: Phosphorus: 6.1 mg/dL — ABNORMAL HIGH (ref 2.3–4.6)

## 2014-07-14 DIAGNOSIS — M47816 Spondylosis without myelopathy or radiculopathy, lumbar region: Secondary | ICD-10-CM | POA: Diagnosis not present

## 2014-07-14 DIAGNOSIS — M47817 Spondylosis without myelopathy or radiculopathy, lumbosacral region: Secondary | ICD-10-CM | POA: Diagnosis not present

## 2014-07-14 DIAGNOSIS — N189 Chronic kidney disease, unspecified: Secondary | ICD-10-CM | POA: Diagnosis not present

## 2014-07-14 DIAGNOSIS — N186 End stage renal disease: Secondary | ICD-10-CM | POA: Diagnosis not present

## 2014-07-14 DIAGNOSIS — Z4889 Encounter for other specified surgical aftercare: Secondary | ICD-10-CM | POA: Diagnosis not present

## 2014-07-14 DIAGNOSIS — Y841 Kidney dialysis as the cause of abnormal reaction of the patient, or of later complication, without mention of misadventure at the time of the procedure: Secondary | ICD-10-CM | POA: Diagnosis not present

## 2014-07-14 DIAGNOSIS — Z992 Dependence on renal dialysis: Secondary | ICD-10-CM | POA: Diagnosis not present

## 2014-07-17 DIAGNOSIS — Z992 Dependence on renal dialysis: Secondary | ICD-10-CM | POA: Diagnosis not present

## 2014-07-17 DIAGNOSIS — I120 Hypertensive chronic kidney disease with stage 5 chronic kidney disease or end stage renal disease: Secondary | ICD-10-CM | POA: Diagnosis not present

## 2014-07-17 DIAGNOSIS — N186 End stage renal disease: Secondary | ICD-10-CM | POA: Diagnosis not present

## 2014-07-18 DIAGNOSIS — N2581 Secondary hyperparathyroidism of renal origin: Secondary | ICD-10-CM | POA: Diagnosis not present

## 2014-07-18 DIAGNOSIS — D631 Anemia in chronic kidney disease: Secondary | ICD-10-CM | POA: Diagnosis not present

## 2014-07-18 DIAGNOSIS — N186 End stage renal disease: Secondary | ICD-10-CM | POA: Diagnosis not present

## 2014-07-18 DIAGNOSIS — Z4931 Encounter for adequacy testing for hemodialysis: Secondary | ICD-10-CM | POA: Diagnosis not present

## 2014-07-18 DIAGNOSIS — N2589 Other disorders resulting from impaired renal tubular function: Secondary | ICD-10-CM | POA: Diagnosis not present

## 2014-07-18 DIAGNOSIS — D509 Iron deficiency anemia, unspecified: Secondary | ICD-10-CM | POA: Diagnosis not present

## 2014-07-28 ENCOUNTER — Other Ambulatory Visit: Payer: Self-pay

## 2014-07-28 DIAGNOSIS — E785 Hyperlipidemia, unspecified: Secondary | ICD-10-CM | POA: Diagnosis not present

## 2014-08-06 ENCOUNTER — Ambulatory Visit: Admission: RE | Admit: 2014-08-06 | Payer: Medicare Other | Source: Ambulatory Visit | Admitting: Vascular Surgery

## 2014-08-06 ENCOUNTER — Encounter: Admission: RE | Payer: Self-pay | Source: Ambulatory Visit

## 2014-08-06 SURGERY — ARTERIOVENOUS (AV) FISTULA CREATION
Anesthesia: Choice

## 2014-08-07 ENCOUNTER — Emergency Department (HOSPITAL_COMMUNITY): Payer: Medicare Other

## 2014-08-07 ENCOUNTER — Inpatient Hospital Stay (HOSPITAL_COMMUNITY): Payer: Medicare Other

## 2014-08-07 ENCOUNTER — Inpatient Hospital Stay (HOSPITAL_COMMUNITY)
Admission: EM | Admit: 2014-08-07 | Discharge: 2014-08-11 | DRG: 640 | Disposition: A | Discharge status: Home / Self Care | Payer: Medicare Other | Attending: Family Medicine | Admitting: Family Medicine

## 2014-08-07 ENCOUNTER — Other Ambulatory Visit: Payer: Self-pay

## 2014-08-07 ENCOUNTER — Encounter: Payer: Self-pay | Admitting: Family Medicine

## 2014-08-07 ENCOUNTER — Encounter (HOSPITAL_COMMUNITY): Payer: Self-pay | Admitting: General Practice

## 2014-08-07 DIAGNOSIS — N19 Unspecified kidney failure: Secondary | ICD-10-CM

## 2014-08-07 DIAGNOSIS — F1120 Opioid dependence, uncomplicated: Secondary | ICD-10-CM | POA: Diagnosis present

## 2014-08-07 DIAGNOSIS — G4733 Obstructive sleep apnea (adult) (pediatric): Secondary | ICD-10-CM | POA: Diagnosis present

## 2014-08-07 DIAGNOSIS — E873 Alkalosis: Secondary | ICD-10-CM | POA: Diagnosis present

## 2014-08-07 DIAGNOSIS — R569 Unspecified convulsions: Secondary | ICD-10-CM | POA: Insufficient documentation

## 2014-08-07 DIAGNOSIS — G253 Myoclonus: Secondary | ICD-10-CM | POA: Diagnosis present

## 2014-08-07 DIAGNOSIS — E8770 Fluid overload, unspecified: Secondary | ICD-10-CM | POA: Diagnosis present

## 2014-08-07 DIAGNOSIS — E878 Other disorders of electrolyte and fluid balance, not elsewhere classified: Secondary | ICD-10-CM | POA: Diagnosis present

## 2014-08-07 DIAGNOSIS — G8929 Other chronic pain: Secondary | ICD-10-CM | POA: Diagnosis present

## 2014-08-07 DIAGNOSIS — R5383 Other fatigue: Secondary | ICD-10-CM | POA: Diagnosis not present

## 2014-08-07 DIAGNOSIS — I120 Hypertensive chronic kidney disease with stage 5 chronic kidney disease or end stage renal disease: Secondary | ICD-10-CM | POA: Diagnosis present

## 2014-08-07 DIAGNOSIS — N2581 Secondary hyperparathyroidism of renal origin: Secondary | ICD-10-CM | POA: Diagnosis present

## 2014-08-07 DIAGNOSIS — K219 Gastro-esophageal reflux disease without esophagitis: Secondary | ICD-10-CM | POA: Diagnosis present

## 2014-08-07 DIAGNOSIS — I9589 Other hypotension: Secondary | ICD-10-CM | POA: Diagnosis present

## 2014-08-07 DIAGNOSIS — D638 Anemia in other chronic diseases classified elsewhere: Secondary | ICD-10-CM | POA: Diagnosis present

## 2014-08-07 DIAGNOSIS — F17210 Nicotine dependence, cigarettes, uncomplicated: Secondary | ICD-10-CM | POA: Diagnosis present

## 2014-08-07 DIAGNOSIS — T39095A Adverse effect of salicylates, initial encounter: Secondary | ICD-10-CM | POA: Diagnosis present

## 2014-08-07 DIAGNOSIS — R410 Disorientation, unspecified: Secondary | ICD-10-CM | POA: Diagnosis not present

## 2014-08-07 DIAGNOSIS — Z7982 Long term (current) use of aspirin: Secondary | ICD-10-CM

## 2014-08-07 DIAGNOSIS — A047 Enterocolitis due to Clostridium difficile: Secondary | ICD-10-CM | POA: Diagnosis present

## 2014-08-07 DIAGNOSIS — F329 Major depressive disorder, single episode, unspecified: Secondary | ICD-10-CM | POA: Diagnosis present

## 2014-08-07 DIAGNOSIS — Z7901 Long term (current) use of anticoagulants: Secondary | ICD-10-CM | POA: Diagnosis not present

## 2014-08-07 DIAGNOSIS — R4182 Altered mental status, unspecified: Secondary | ICD-10-CM | POA: Diagnosis not present

## 2014-08-07 DIAGNOSIS — F419 Anxiety disorder, unspecified: Secondary | ICD-10-CM | POA: Diagnosis present

## 2014-08-07 DIAGNOSIS — F1320 Sedative, hypnotic or anxiolytic dependence, uncomplicated: Secondary | ICD-10-CM | POA: Diagnosis present

## 2014-08-07 DIAGNOSIS — Z992 Dependence on renal dialysis: Secondary | ICD-10-CM | POA: Diagnosis not present

## 2014-08-07 DIAGNOSIS — Z9884 Bariatric surgery status: Secondary | ICD-10-CM

## 2014-08-07 DIAGNOSIS — Z9115 Patient's noncompliance with renal dialysis: Secondary | ICD-10-CM | POA: Diagnosis present

## 2014-08-07 DIAGNOSIS — N186 End stage renal disease: Secondary | ICD-10-CM | POA: Diagnosis present

## 2014-08-07 DIAGNOSIS — J9811 Atelectasis: Secondary | ICD-10-CM | POA: Diagnosis not present

## 2014-08-07 DIAGNOSIS — E870 Hyperosmolality and hypernatremia: Secondary | ICD-10-CM | POA: Insufficient documentation

## 2014-08-07 DIAGNOSIS — M549 Dorsalgia, unspecified: Secondary | ICD-10-CM | POA: Diagnosis present

## 2014-08-07 DIAGNOSIS — A0472 Enterocolitis due to Clostridium difficile, not specified as recurrent: Secondary | ICD-10-CM | POA: Insufficient documentation

## 2014-08-07 DIAGNOSIS — I6789 Other cerebrovascular disease: Secondary | ICD-10-CM | POA: Diagnosis not present

## 2014-08-07 DIAGNOSIS — R29810 Facial weakness: Secondary | ICD-10-CM | POA: Diagnosis not present

## 2014-08-07 DIAGNOSIS — M6281 Muscle weakness (generalized): Secondary | ICD-10-CM | POA: Diagnosis not present

## 2014-08-07 LAB — CBC
HCT: 32.8 % — ABNORMAL LOW (ref 39.0–52.0)
HEMATOCRIT: 33.3 % — AB (ref 39.0–52.0)
HEMOGLOBIN: 9.7 g/dL — AB (ref 13.0–17.0)
Hemoglobin: 9.4 g/dL — ABNORMAL LOW (ref 13.0–17.0)
MCH: 30 pg (ref 26.0–34.0)
MCH: 29.7 pg (ref 26.0–34.0)
MCHC: 29.1 g/dL — AB (ref 30.0–36.0)
MCHC: 28.7 g/dL — ABNORMAL LOW (ref 30.0–36.0)
MCV: 103.1 fL — AB (ref 78.0–100.0)
MCV: 103.8 fL — ABNORMAL HIGH (ref 78.0–100.0)
Platelets: 401 10*3/uL — ABNORMAL HIGH (ref 150–400)
Platelets: 410 10*3/uL — ABNORMAL HIGH (ref 150–400)
RBC: 3.23 MIL/uL — ABNORMAL LOW (ref 4.22–5.81)
RBC: 3.16 MIL/uL — ABNORMAL LOW (ref 4.22–5.81)
RDW: 18.6 % — ABNORMAL HIGH (ref 11.5–15.5)
RDW: 18.7 % — AB (ref 11.5–15.5)
WBC: 9 10*3/uL (ref 4.0–10.5)
WBC: 10.1 10*3/uL (ref 4.0–10.5)

## 2014-08-07 LAB — COMPREHENSIVE METABOLIC PANEL
ALK PHOS: 69 U/L (ref 38–126)
ALT: 13 U/L — AB (ref 17–63)
ANION GAP: 27 — AB (ref 5–15)
AST: 26 U/L (ref 15–41)
Albumin: 2.8 g/dL — ABNORMAL LOW (ref 3.5–5.0)
BUN: 50 mg/dL — ABNORMAL HIGH (ref 6–20)
CALCIUM: 6.6 mg/dL — AB (ref 8.9–10.3)
CO2: 48 mmol/L — ABNORMAL HIGH (ref 22–32)
Chloride: 76 mmol/L — ABNORMAL LOW (ref 101–111)
Creatinine, Ser: 20.7 mg/dL — ABNORMAL HIGH (ref 0.61–1.24)
GFR calc Af Amer: 3 mL/min — ABNORMAL LOW (ref >60)
GFR, EST NON AFRICAN AMERICAN: 2 mL/min — AB (ref >60)
Glucose, Bld: 105 mg/dL — ABNORMAL HIGH (ref 65–99)
Potassium: 3.8 mmol/L (ref 3.5–5.1)
Sodium: 151 mmol/L — ABNORMAL HIGH (ref 135–145)
Total Bilirubin: 0.5 mg/dL (ref 0.3–1.2)
Total Protein: 6.6 g/dL (ref 6.5–8.1)

## 2014-08-07 LAB — RENAL FUNCTION PANEL
Albumin: 2.8 g/dL — ABNORMAL LOW (ref 3.5–5.0)
Anion gap: 27 — ABNORMAL HIGH (ref 5–15)
BUN: 48 mg/dL — ABNORMAL HIGH (ref 6–20)
CO2: 49 mmol/L — AB (ref 22–32)
CREATININE: 20.91 mg/dL — AB (ref 0.61–1.24)
Calcium: 6.8 mg/dL — ABNORMAL LOW (ref 8.9–10.3)
Chloride: 75 mmol/L — ABNORMAL LOW (ref 101–111)
GFR calc Af Amer: 3 mL/min — ABNORMAL LOW (ref >60)
GFR calc non Af Amer: 2 mL/min — ABNORMAL LOW (ref >60)
Glucose, Bld: 88 mg/dL (ref 65–99)
Phosphorus: 12 mg/dL — ABNORMAL HIGH (ref 2.5–4.6)
Potassium: 3.5 mmol/L (ref 3.5–5.1)
Sodium: 151 mmol/L — ABNORMAL HIGH (ref 135–145)

## 2014-08-07 LAB — TSH: TSH: 1.16 u[IU]/mL (ref 0.350–4.500)

## 2014-08-07 LAB — ETHANOL

## 2014-08-07 LAB — CBG MONITORING, ED: Glucose-Capillary: 92 mg/dL (ref 65–99)

## 2014-08-07 LAB — TROPONIN I

## 2014-08-07 LAB — MAGNESIUM: Magnesium: 2.8 mg/dL — ABNORMAL HIGH (ref 1.7–2.4)

## 2014-08-07 LAB — I-STAT CG4 LACTIC ACID, ED: LACTIC ACID, VENOUS: 1.99 mmol/L (ref 0.5–2.0)

## 2014-08-07 MED ORDER — SODIUM CHLORIDE 0.9 % IV SOLN: 100.0000 mL | INTRAVENOUS | Status: DC | PRN

## 2014-08-07 MED ORDER — LIDOCAINE HCL (PF) 1 % IJ SOLN: 5.0000 mL | INTRAMUSCULAR | Status: DC | PRN

## 2014-08-07 MED ORDER — SODIUM CHLORIDE 0.9 % IV BOLUS (SEPSIS)
500.0000 mL | Freq: Once | INTRAVENOUS | Status: AC
  Administered 2014-08-07: 500 mL via INTRAVENOUS

## 2014-08-07 MED ORDER — LIDOCAINE-PRILOCAINE 2.5-2.5 % EX CREA
1.0000 "application " | TOPICAL_CREAM | CUTANEOUS | Status: DC | PRN
  Filled 2014-08-07: qty 5

## 2014-08-07 MED ORDER — PENTAFLUOROPROP-TETRAFLUOROETH EX AERO: 1.0000 "application " | INHALATION_SPRAY | CUTANEOUS | Status: DC | PRN

## 2014-08-07 MED ORDER — ALTEPLASE 2 MG IJ SOLR
2.0000 mg | Freq: Once | INTRAMUSCULAR | Status: AC | PRN
  Filled 2014-08-07: qty 2

## 2014-08-07 MED ORDER — ASPIRIN 81 MG PO TABS
81.0000 mg | ORAL_TABLET | Freq: Every day | ORAL | Status: DC
  Administered 2014-08-09 – 2014-08-11 (×3): 81 mg via ORAL
  Filled 2014-08-07 (×6): qty 1

## 2014-08-07 MED ORDER — HEPARIN SODIUM (PORCINE) 1000 UNIT/ML DIALYSIS
1000.0000 [IU] | INTRAMUSCULAR | Status: DC | PRN
  Filled 2014-08-07: qty 1

## 2014-08-07 MED ORDER — NEPRO/CARBSTEADY PO LIQD: 237.0000 mL | ORAL | Status: DC | PRN

## 2014-08-07 MED ORDER — APIXABAN 2.5 MG PO TABS
2.5000 mg | ORAL_TABLET | Freq: Two times a day (BID) | ORAL | Status: DC
  Administered 2014-08-08 – 2014-08-11 (×6): 2.5 mg via ORAL
  Filled 2014-08-07 (×9): qty 1

## 2014-08-07 NOTE — Progress Notes (Signed)
Pt received from [facility] jerking all over, hypotensive, and lethargic. Dr. Jimmey Ralph aware and ordered Ct scan before initiating HD. Dr. Hyman Hopes notified prior to attempting to dialyze pt.  Accompanied pt to get CT scan, and RRT called D/T bp low, pt transferred to 6E21.  Dr. Hyman Hopes aware and ordered to hold hd for today until pt stable.

## 2014-08-07 NOTE — H&P (Signed)
Family Medicine Teaching [facility] Admission History and Physical Service Pager: [phone]  Patient name: Perry [name] Medical record number: [id number] Date of birth: [birth year] Age: 44 y.o. Gender: male  Primary Care Provider: Leo Grosser, MD Consultants: Nephrology Code Status: Full  Chief Complaint: Altered Mental Status  Assessment and Plan: Perry [name] is a 44 y.o. male presenting with altered mental status. PMH is significant for ESRD on dialysis  AMS. Concern for intracranial bleed given DOAC use in setting of recent head trauma. Must also consider toxic metabolite build-up (patient with similar presentation 3 months a go that resolved with dialysis). Must also consider ingestion/overdose. Seizures also possible, though no prior history of seizure disorder (prior seizure occurred in setting of poor HD access). No fevers, chills, or leukocytosis to suggest infection. Sepsis less likely (1/4 SIRS (RR), q-SOFA 1/3, lactate wnl). - Obtain STAT head CT - Nephrology consulted, will be undergoing emergent HD - Will obtain UDS and ethanol levels.  - Will obtain TSH - Consider EEG if not improving - Follow up blood cultures  ESRD on Dialysis. Electrolytes relatively stable on admission. Undergoes HD 5 times weekly at home. - Nephrology consulted, appreciate assistance - HD here  Depression / Anxiety. On home lexapro, xanax, ativan, norco - Hold home medications until mental status improves  Prolonged QTc. QTc 542 on admission EKG. - Hold lexapro. Conisder decreasing dose - Check Mg - AM EKG  FEN/GI: NPO, SLIV Prophylaxis: SCDs until intracranial bleed ruled out  Disposition: Admit to step-down pending above management.   History of Present Illness: Perry [name] is a 44 y.o. male presenting with altered mental status. History is provided by the patient's family.  Patient's wife reports that patient was in his usual state of health until yesterday when  he became more weak and stated having tremors. Patient's wife reports that he started using a cane yesterday to help him walk around the house. Also reports that he was more confused yesterday and was occasionally saying things that did not make sense. This morning, the patient's wife reports that we was very difficult to arouse around 6:30am. EMS was called, however upon arrival the patient was able to state that he did not want to be taken to the ED. Patient's mental status continued to decline. Around 11:00am, EMS was called again and the patient was brought to the ED.  Patient's wife reports that he has HD 5 times a week at home. He uses a portacath in his chest for access. Denies any fevers or chills. No recent illnesses. No recent changes in medications. No known drug or alcohol use. Wife not aware of any medication overdoses. States that HD has been going well at home.   Patient's wife and mother report that patient has been noted to fall several times over the past two days, frequently hitting his head.  Wife reports that the patient had similar symptoms in April which improved after emergent hemodialysis.   In the ED, patient was noted to have numerous electrolyte abnormalities including hypernatremia (151), hypochloremia (76), Alkalosis (bicarb 48), uremia (BUN 50), and hypocalcemia (6.6). Nephrology was consulted to take the patient for emergent dialysis.  Review Of Systems: Per HPI, Otherwise 12 point review of systems was performed and was unremarkable.  Patient Active Problem List   Diagnosis Date Noted  . 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 Epigastric abdominal pain   . Abdominal pain, epigastric 11/17/2013  . Hypotension 11/17/2013  . Abdominal pain 11/16/2013  .  Seizures 06/06/2012  . ESRD on dialysis 06/06/2012  . Anemia of chronic disease 06/06/2012  . Secondary hyperparathyroidism (of renal origin) 06/06/2012  . HTN (hypertension) 06/06/2012  . Arteriovenous  fistula occlusion 06/06/2012   Past Medical History: Past Medical History  Diagnosis Date  . Dialysis patient   . Sleep apnea     cpap-   . Anxiety   . Kidney failure     on dialysis since 2011- [facility]- nephrologist   . GERD (gastroesophageal reflux disease)   . High blood pressure     hx of   . Acute meniscal tear of left knee   . Acute meniscal tear of right knee   . Chronic back pain     due to weight   . Hemodialysis patient     since 2011, does hemodialysis at home, wife does she is a [employer] , Daily except wed and sun   Past Surgical History: Past Surgical History  Procedure Laterality Date  . Av fistula repair    . Av fistula placement      left arm -   . Stents in left lower forearm       due to clotting in Av fistula   . Av fistula right wrist     . Av fistula right upper arm     . Thrombectomies to left lower foreram fistula     . 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 Laparoscopic gastric sleeve resection N/A 05/05/2014    Procedure: LAPAROSCOPIC GASTRIC SLEEVE RESECTION;  Surgeon: Luretha Murphy, MD;  Location: [facility] ORS;  Service: General;  Laterality: N/A;  . Peripheral vascular catheterization N/A 06/02/2014    Procedure: A/V Shuntogram/Fistulagram;  Surgeon: Annice Needy, MD;  Location: [facility] INVASIVE CV LAB;  Service: Cardiovascular;  Laterality: N/A;  . Peripheral vascular catheterization N/A 06/02/2014    Procedure: A/V Shunt Intervention;  Surgeon: Annice Needy, MD;  Location: [facility] INVASIVE CV LAB;  Service: Cardiovascular;  Laterality: N/A;  . Peripheral vascular catheterization N/A 06/02/2014    Procedure: Dialysis/Perma Catheter Insertion;  Surgeon: Annice Needy, MD;  Location: [facility] INVASIVE CV LAB;  Service: Cardiovascular;  Laterality: N/A;  . Av fistula placement Left 06/26/2014    Procedure: Left arm AV fistula creation;  Surgeon: Annice Needy, MD;  Location: [facility] ORS;  Service: Vascular;  Laterality: Left;   Social History: History  Substance  Use Topics  . Smoking status: Heavy Tobacco Smoker -- 0.50 packs/day for 20 years    Types: Cigarettes    Last Attempt to Quit: 06/17/2013  . Smokeless tobacco: Never Used  . Alcohol Use: No   Please also refer to relevant sections of EMR.  Family History: Family History  Problem Relation Age of Onset  . High blood pressure Mother    Allergies and Medications: No Known Allergies No current facility-administered medications on file prior to encounter.   Current Outpatient Prescriptions on File Prior to Encounter  Medication Sig Dispense Refill  . alprazolam (XANAX) 2 MG tablet TAKE ONE TABLET BY MOUTH AT BEDTIME AS NEEDED FOR SLEEP **REFILLS  MUST  BE  AT  LEAST  30  DAYS  APART** 30 tablet 2  . apixaban (ELIQUIS) 2.5 MG TABS tablet Take 2.5 mg by mouth 2 (two) times daily.    [name] aspirin 81 MG tablet Take 81 mg by mouth daily.    . calcitRIOL (ROCALTROL) 0.25 MCG capsule Take 0.75 mcg  by mouth 3 (three) times daily with meals.     . cinacalcet (SENSIPAR) 60 MG tablet Take 120 mg by mouth every evening.     . escitalopram (LEXAPRO) 10 MG tablet Take 1 tablet (10 mg total) by mouth daily. 30 tablet 3  . HYDROcodone-acetaminophen (NORCO) 5-325 MG per tablet Take 1 tablet by mouth every 6 (six) hours as needed for moderate pain. 30 tablet 0  . LORazepam (ATIVAN) 0.5 MG tablet Take 1 tablet (0.5 mg total) by mouth every 8 (eight) hours. 10 tablet 0  . multivitamin (RENA-VIT) TABS tablet Take 1 tablet by mouth at bedtime.     . Oxycodone HCl 10 MG TABS Take 10 mg by mouth every 4 (four) hours as needed (for pain).     [name] sevelamer (RENVELA) 800 MG tablet Take 3,200 mg by mouth 3 (three) times daily with meals.     [name] acetaminophen (TYLENOL) 325 MG tablet Take 650 mg by mouth every 6 (six) hours as needed for mild pain or moderate pain.      Objective: BP 146/110 mmHg  Pulse 83  Resp 22  SpO2 99% Exam: General: 44yo man lying in hospital bed, eyes closed. Eyes: Right pupil dilated  (5mm), reactive to light. Left pupil dilated, reactive to light but sluggish. ENTM: MMM Neck: No deformities noted Cardiovascular: RRR, no murmurs appreciated Respiratory: NWOB, CTAB over anterior fields Abdomen: Obese, S, NT, ND MSK: No deformities, no clubbing or cyanosis Skin: Several surgical scars noted, no rashes or areas of skin breakdown noted Neuro: Somnolent, briefly opens eyes to voice. Able to verbalize name. Follows commands. Moves all extremities spontaneously. Whole body jerking noted every 30-60 seconds.   Labs and Imaging: CBC BMET   Recent Labs Lab 08/07/14 1320  WBC 9.0  HGB 9.7*  HCT 33.3*  PLT 401*    Recent Labs Lab 08/07/14 1320  NA 151*  K 3.8  CL 76*  CO2 48*  BUN 50*  CREATININE 20.70*  GLUCOSE 105*  CALCIUM 6.6*     Lactic Acid 1.99 Troponin <0.03  EKG: NSR, QTc 542, no ischemic changes.   Dg Chest Port 1 View  08/07/2014   CLINICAL DATA:  Altered mental status.  EXAM: PORTABLE CHEST - 1 VIEW  COMPARISON:  11/16/2013.  FINDINGS: Interval left jugular double-lumen catheter with its tip in the superior vena cava or azygos arch. Interval right subclavian and axillary stents. Poor inspiration. Mild bibasilar atelectasis. Mildly enlarged cardiac silhouette with a mild increase in size, accentuated by the poor inspiration. Minimal left glenohumeral joint degenerative changes.  IMPRESSION: Poor inspiration with mild bibasilar atelectasis.   Electronically Signed   By: Beckie Salts M.D.   On: 08/07/2014 13:46   Ardith Dark, MD 08/07/2014, 2:56 PM PGY-2, [facility] FPTS Intern pager: [phone], text pages welcome

## 2014-08-07 NOTE — Progress Notes (Signed)
Family Medicine Teaching Service Daily Progress Note Intern Pager: [phone]  Patient name: Perry [name] Medical record number: [id number] Date of birth: March 02, 1970 Age: 44 y.o. Gender: male  Primary Care Provider: Leo Grosser, MD Consultants: Nephrology Code Status: FULL  Pt Overview and Major Events to Date:  07/21: emergent HD  Assessment and Plan: Perry [name] is a 44 y.o. male presenting with altered mental status. PMH is significant for ESRD on dialysis  AMS. Intracranial bleed ruled out with negative head CT. Must also consider toxic metabolite build-up (patient with similar presentation 3 months ago that resolved with dialysis). Must also consider ingestion/overdose . Seizures also possible, especially given findings of EEG, though no prior history of seizure disorder (prior seizure occurred in setting of poor HD access). No fevers, chills, or leukocytosis to suggest infection. Sepsis less likely (1/4 SIRS (RR), q-SOFA 1/3, lactate wnl).  - EtOH negative, Hep B negative; UDS pending - TSH WNL - EEG showed generalized epileptiform activity - Brain MRI - Per neuro, begin valproate - Follow-up blood cultures - Per nephro, most likely metabolic etiology. Continue dialysis  ESRD on Dialysis. Electrolytes relatively stable on admission. Undergoes HD 5 times weekly at home. - Received HD today - Per nephro, may need center-based dialysis upon discharge  Depression / Anxiety. On home lexapro, xanax, ativan, norco - Hold home medications until mental status improves  Prolonged QTc. QTc 542 on admission EKG. - Hold lexapro. Continue decreasing dose - Mg increased to 2.8   Hypernatremia: Na 151 on arrival - Improved to 147 today most likely as result of dialysis - Continue to monitor   FEN/GI: NPO, SLIV Prophylaxis: home Eliquis  Disposition: transfer out of SDU pending medical improvement   Subjective:  Perry [name] is responsive to voice this morning, but  repeatedly fell asleep or stopped answering questions mid-sentence. He is oriented to person and place but not time. He reports no pain. He continues to have myoclonic jerking, primarily of the upper extremities today, as well as facial twitches. He reports that he does not notice the jerking and twitching.  Objective: Temp:  [98 F (36.7 C)-98.7 F (37.1 C)] 98.7 F (37.1 C) (07/22 0814) Pulse Rate:  [77-87] 84 (07/22 0814) Resp:  [9-33] 9 (07/22 0814) BP: (90-148)/(54-110) 108/72 mmHg (07/22 0814) SpO2:  [94 %-100 %] 96 % (07/22 0814) Weight:  [273 lb 13 oz (124.2 kg)] 273 lb 13 oz (124.2 kg) (07/21 2027) Physical Exam: General: obese male on non-rebreather lying in bed HEENT: twitching of eyebrows, primarily R; pupils sluggish bilaterally Cardiovascular: RRR, no murmurs appreciated Respiratory: hard to auscultate give non-rebreather but no prominent wheezes/crackles appreciated Abdomen: soft, non-tender, non-distended, +BS Extremities: jerking of upper extremities Neuro: oriented to person and place, not time  Laboratory:  Recent Labs Lab 08/07/14 1320 08/07/14 1850  WBC 9.0 10.1  HGB 9.7* 9.4*  HCT 33.3* 32.8*  PLT 401* 410*    Recent Labs Lab 08/07/14 1320 08/07/14 1855 08/08/14 0242  NA 151* 151* 147*  K 3.8 3.5 3.6  CL 76* 75* 90*  CO2 48* 49* 38*  BUN 50* 48* 32*  CREATININE 20.70* 20.91* 15.44*  CALCIUM 6.6* 6.8* 8.0*  PROT 6.6  --   --   BILITOT 0.5  --   --   ALKPHOS 69  --   --   ALT 13*  --   --   AST 26  --   --   GLUCOSE 105* 88 86    Imaging/Diagnostic  Tests: Ct Head Wo Contrast  08/07/2014   CLINICAL DATA:  Weakness, lethargy  EXAM: CT HEAD WITHOUT CONTRAST  TECHNIQUE: Contiguous axial images were obtained from the base of the skull through the vertex without intravenous contrast.  COMPARISON:  06/06/2012  FINDINGS: No skull fracture is noted. Paranasal sinuses and mastoid air cells are unremarkable. No intracranial hemorrhage, mass effect or  midline shift. No acute cortical infarction. No mass lesion is noted on this unenhanced scan. The gray and white-matter differentiation is preserved.  IMPRESSION: No acute intracranial abnormality.  No significant change.   Electronically Signed   By: Natasha Mead M.D.   On: 08/07/2014 16:31   Dg Chest Port 1 View  08/07/2014   CLINICAL DATA:  Altered mental status.  EXAM: PORTABLE CHEST - 1 VIEW  COMPARISON:  11/16/2013.  FINDINGS: Interval left jugular double-lumen catheter with its tip in the superior vena cava or azygos arch. Interval right subclavian and axillary stents. Poor inspiration. Mild bibasilar atelectasis. Mildly enlarged cardiac silhouette with a mild increase in size, accentuated by the poor inspiration. Minimal left glenohumeral joint degenerative changes.  IMPRESSION: Poor inspiration with mild bibasilar atelectasis.   Electronically Signed   By: Beckie Salts M.D.   On: 08/07/2014 13:46     Berton Butrick Shelbie Hutching, MD 08/08/2014, 9:36 AM PGY-1, [facility] FPTS Intern pager: [phone], text pages welcome

## 2014-08-07 NOTE — Significant Event (Signed)
Rapid Response Event Note  Overview: Time Called: 1608 Arrival Time: 1615 Event Type: Neurologic, Hypotension  Initial Focused Assessment:  Called STAT to CT scan by CT tech for patient from HD with low bp 70's and lethargic.  Upon my arrival to CT scan, HD RN present as well as CN from 6 E who was called for assistance.  Patient on stretcher, lethargic however does arouse but quickly drifts off, He can state he is in the hospital.  Patient is noted to having jerking movements all over body.  Patient on NRB.     Interventions:  BP cuff repositioned and SBP 90's, pulse ox changed sat 98% on NRB.  HD RN states she is not comfortable doing HD on patient with jerking movements. Patient brought up to 6 E 21 to free up CT scan.  MD paged, SDU orders received while waiting for bed assignment jerking movements seemed to slow down, however  On arrival to SDU patient with more frequent jerking movements.  SBP on arrival to SDU 79, rechecked SBP 108.  HD RN called to notify of patients new bed assignment and HD RN informed us that HD will not be done today as per Dr. Hyman Hopes.  MD paged to come to bedside on SDU.     Event Summary:  Report was given to SDU by 6 E CN.     at      at          [facility], Maryagnes Amos

## 2014-08-07 NOTE — ED Notes (Addendum)
Pt brought in via [facility] with complaints of decreased responsiveness, weakness, and lethargy. Pts wife noticed pt was less responsive last night. Pt is confused and difficult to arose. Pt presents with intermittent tremors, wife states tremors are worse when he has not had dialysis. Pt last received dialysis on Tuesday. Pt usually does HD at home 5 times a week, but has been too weak.

## 2014-08-07 NOTE — Progress Notes (Signed)
This is a late entry.  This RN was called by HD RN requesting writer to accompany patient for a CT scan due to unstable condition. Upon arrival to radiology/CT, patient was lethargic (able to arouse, but unable to answer questions), on NRB mask, and with very frequent generalized jerking movements. Rapid response RN was also notified. HD RN stated she didn't feel comfortable dialyzing patient at this time and he would need an inpatient bed. Patient transferred to 6E21 until a stepdown bed was available; rapid response RN remained at bedside. Prior to transfer to SDU, HD RN stated they were deferring HD treatment until tomorrow. Transferred to 2C09, attending team notified on arrival. Dr. Jimmey Ralph arrived at bedside.   Suhaib Guzzo, BSN, RN-BC.

## 2014-08-07 NOTE — Consult Note (Signed)
Reason for Consult: HD Referring Physician: Dr. Lacinda Axon, Emergency Medicine  HPI: Perry [name] is a 44 yo male with PMHx of ESRD on HD, OSA, GERD, HTN, chronic back pain, gastric bypass surgeryand recent diagnosis of myoclonic jerks. Patient is a level V caveat due to AMS and lethargy. History is taken from previous notes. Patient presented to the ED from [facility] for decreased responsiveness, weakness, and lethargy. Patient has also been falling recently at home. Patient's wife noted altered mental status last night at home. On presentation, patient continues to be altered, lethargic and somnolent with visible jerks. These jerks were recently diagnosed at a previous ED visit as myclonic jerks which seem to worsen when patient has not had dialysis. Patient is on HD at home 5 times a week, but unfortunately he has been too weak to complete sessions for the past few days. He last received HD on Tuesday (08/05/2014), 3 days ago.   Patient's Primary Nephrologist is Dr. Joelyn Oms.   Per chart review, patient performs dialysis nightly at home through the AV fistula in his right upper arm. Recently in April-May of 2016, there was a clot proximal to the AV fistula and his home dialysis became unsuccessful. Patient became very disoriented and confused and was admitted to the hospital for emergent dialysis through a temporary catheter for hyperkalemia >8. Vascular surgery was then able to reestablish vascular access in the AV fistula by the administration of TPA. Patient later resumed performing nightly dialysis at home with no complications.  Past Medical History  Diagnosis Date  . Dialysis patient   . Sleep apnea     cpap-   . Anxiety   . Kidney failure     on dialysis since 2011- [facility]- nephrologist   . GERD (gastroesophageal reflux disease)   . High blood pressure     hx of   . Acute meniscal tear of left knee   . Acute meniscal tear of right knee   . Chronic back pain     due to weight   .  Hemodialysis patient     since 2011, does hemodialysis at home, wife does she is a Designer, [employer] , Daily except wed and sun    Past Surgical History  Procedure Laterality Date  . Av fistula repair    . Av fistula placement      left arm -   . Stents in left lower forearm       due to clotting in Av fistula   . Av fistula right wrist     . Av fistula right upper arm     . Thrombectomies to left lower foreram fistula     . 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 Laparoscopic gastric sleeve resection N/A 05/05/2014    Procedure: LAPAROSCOPIC GASTRIC SLEEVE RESECTION;  Surgeon: Johnathan Hausen, MD;  Location: [facility];  Service: General;  Laterality: N/A;  . Peripheral vascular catheterization N/A 06/02/2014    Procedure: A/V Shuntogram/Fistulagram;  Surgeon: Algernon Huxley, MD;  Location: [place] [facility];  Service: Cardiovascular;  Laterality: N/A;  . Peripheral vascular catheterization N/A 06/02/2014    Procedure: A/V Shunt Intervention;  Surgeon: Algernon Huxley, MD;  Location: [place] [facility];  Service: Cardiovascular;  Laterality: N/A;  . Peripheral vascular catheterization N/A 06/02/2014    Procedure: Dialysis/Perma Catheter Insertion;  Surgeon: Algernon Huxley, MD;  Location: [place] [facility];  Service: Cardiovascular;  Laterality: N/A;  .  Av fistula placement Left 06/26/2014    Procedure: Left arm AV fistula creation;  Surgeon: Algernon Huxley, MD;  Location: [facility];  Service: Vascular;  Laterality: Left;    Family History  Problem Relation Age of Onset  . High blood pressure Mother     Social History:  reports that he has been smoking Cigarettes.  He has a 10 pack-year smoking history. He has never used smokeless tobacco. He reports that he does not drink alcohol or use illicit drugs.  Allergies: No Known Allergies  Medications:  I have reviewed the patient's current medications. Prior to Admission:  Prescriptions prior to admission  Medication Sig Dispense Refill Last Dose  .  alprazolam (XANAX) 2 MG tablet TAKE ONE TABLET BY MOUTH AT BEDTIME AS NEEDED FOR SLEEP **REFILLS  MUST  BE  AT  LEAST  30  DAYS  APART** 30 tablet 2 Past Week at Unknown time  . apixaban (ELIQUIS) 2.5 MG TABS tablet Take 2.5 mg by mouth 2 (two) times daily.   08/06/2014 at Unknown time  . aspirin 81 MG tablet Take 81 mg by mouth daily.   08/06/2014 at Unknown time  . calcitRIOL (ROCALTROL) 0.25 MCG capsule Take 0.75 mcg by mouth 3 (three) times daily with meals.    Past Week at Unknown time  . cinacalcet (SENSIPAR) 60 MG tablet Take 120 mg by mouth every evening.    08/06/2014 at Unknown time  . escitalopram (LEXAPRO) 10 MG tablet Take 1 tablet (10 mg total) by mouth daily. 30 tablet 3 Past Week at Unknown time  . HYDROcodone-acetaminophen (NORCO) 5-325 MG per tablet Take 1 tablet by mouth every 6 (six) hours as needed for moderate pain. 30 tablet 0 Past Week at Unknown time  . LORazepam (ATIVAN) 0.5 MG tablet Take 1 tablet (0.5 mg total) by mouth every 8 (eight) hours. 10 tablet 0 08/06/2014 at Unknown time  . multivitamin (RENA-VIT) TABS tablet Take 1 tablet by mouth at bedtime.    08/06/2014 at Unknown time  . Oxycodone HCl 10 MG TABS Take 10 mg by mouth every 4 (four) hours as needed (for pain).    Past Week at Unknown time  . sevelamer (RENVELA) 800 MG tablet Take 3,200 mg by mouth 3 (three) times daily with meals.    Past Week at Unknown time  . acetaminophen (TYLENOL) 325 MG tablet Take 650 mg by mouth every 6 (six) hours as needed for mild pain or moderate pain.   Unk    Results for orders placed or performed during the hospital encounter of 08/07/14 (from the past 48 hour(s))  Comprehensive metabolic panel     Status: Abnormal   Collection Time: 08/07/14  1:20 PM  Result Value Ref Range   Sodium 151 (H) 135 - 145 mmol/L   Potassium 3.8 3.5 - 5.1 mmol/L   Chloride 76 (L) 101 - 111 mmol/L   CO2 48 (H) 22 - 32 mmol/L   Glucose, Bld 105 (H) 65 - 99 mg/dL   BUN 50 (H) 6 - 20 mg/dL    Creatinine, Ser 20.70 (H) 0.61 - 1.24 mg/dL   Calcium 6.6 (L) 8.9 - 10.3 mg/dL   Total Protein 6.6 6.5 - 8.1 g/dL   Albumin 2.8 (L) 3.5 - 5.0 g/dL   AST 26 15 - 41 U/L   ALT 13 (L) 17 - 63 U/L   Alkaline Phosphatase 69 38 - 126 U/L   Total Bilirubin 0.5 0.3 - 1.2 mg/dL  GFR calc non Af Amer 2 (L) >60 mL/min   GFR calc Af Amer 3 (L) >60 mL/min    Comment: (NOTE) The eGFR has been calculated using the CKD EPI equation. This calculation has not been validated in all clinical situations. eGFR's persistently <60 mL/min signify possible Chronic Kidney Disease.    Anion gap 27 (H) 5 - 15  CBC     Status: Abnormal   Collection Time: 08/07/14  1:20 PM  Result Value Ref Range   WBC 9.0 4.0 - 10.5 K/uL   RBC 3.23 (L) 4.22 - 5.81 MIL/uL   Hemoglobin 9.7 (L) 13.0 - 17.0 g/dL   HCT 33.3 (L) 39.0 - 52.0 %   MCV 103.1 (H) 78.0 - 100.0 fL   MCH 30.0 26.0 - 34.0 pg   MCHC 29.1 (L) 30.0 - 36.0 g/dL   RDW 18.6 (H) 11.5 - 15.5 %   Platelets 401 (H) 150 - 400 K/uL  Troponin I     Status: None   Collection Time: 08/07/14  1:20 PM  Result Value Ref Range   Troponin I <0.03 <0.031 ng/mL    Comment:        NO INDICATION OF MYOCARDIAL INJURY.   CBG monitoring, ED     Status: None   Collection Time: 08/07/14  1:27 PM  Result Value Ref Range   Glucose-Capillary 92 65 - 99 mg/dL  I-Stat CG4 Lactic Acid, ED     Status: None   Collection Time: 08/07/14  1:35 PM  Result Value Ref Range   Lactic Acid, Venous 1.99 0.5 - 2.0 mmol/L  Culture, blood (routine x 2)     Status: None (Preliminary result)   Collection Time: 08/07/14  2:20 PM  Result Value Ref Range   Specimen Description BLOOD RIGHT ANTECUBITAL    Special Requests BOTTLES DRAWN AEROBIC AND ANAEROBIC 10CC    Culture PENDING    Report Status PENDING   Culture, blood (routine x 2)     Status: None (Preliminary result)   Collection Time: 08/07/14  2:30 PM  Result Value Ref Range   Specimen Description BLOOD RIGHT ARM    Special Requests  BOTTLES DRAWN AEROBIC ONLY 10CC    Culture PENDING    Report Status PENDING     Ct Head Wo Contrast  08/07/2014   CLINICAL DATA:  Weakness, lethargy  EXAM: CT HEAD WITHOUT CONTRAST  TECHNIQUE: Contiguous axial images were obtained from the base of the skull through the vertex without intravenous contrast.  COMPARISON:  06/06/2012  FINDINGS: No skull fracture is noted. Paranasal sinuses and mastoid air cells are unremarkable. No intracranial hemorrhage, mass effect or midline shift. No acute cortical infarction. No mass lesion is noted on this unenhanced scan. The gray and white-matter differentiation is preserved.  IMPRESSION: No acute intracranial abnormality.  No significant change.   Electronically Signed   By: Lahoma Crocker M.D.   On: 08/07/2014 16:31   Dg Chest Port 1 View  08/07/2014   CLINICAL DATA:  Altered mental status.  EXAM: PORTABLE CHEST - 1 VIEW  COMPARISON:  11/16/2013.  FINDINGS: Interval left jugular double-lumen catheter with its tip in the superior vena cava or azygos arch. Interval right subclavian and axillary stents. Poor inspiration. Mild bibasilar atelectasis. Mildly enlarged cardiac silhouette with a mild increase in size, accentuated by the poor inspiration. Minimal left glenohumeral joint degenerative changes.  IMPRESSION: Poor inspiration with mild bibasilar atelectasis.   Electronically Signed   By: Percell Locus.D.  On: 08/07/2014 13:46    ROS Level V caveat secondary to lethargy   Physical Exam Filed Vitals:   08/07/14 1325 08/07/14 1330 08/07/14 1430 08/07/14 1433  BP:  129/106 148/83 146/110  Pulse:  80 85 83  Resp:  _0 SpO2: 94% 100% 100% 99%   General: Vital signs reviewed.  Patient is obese, in mild acute distress, lethargic, opens eyes on commands, whole body jerking.  Eyes: PERRL, conjunctivae normal, no scleral icterus.  Neck: Supple, trachea midline  Cardiovascular: RRR, S1 normal, S2 normal Pulmonary/Chest: Clear to auscultation bilaterally,  no wheezes, rales, or rhonchi. Abdominal: Soft, obese, non-tender, BS +, no guarding present.  Extremities: 1-2+ pitting lower extremity edema bilaterally. Jerking extremities periodically. Neurological: Unable to assess. Somnolent/lethargic, but opens eyes to voice  Skin: Diaphoretic. No rashes or erythema.  Assessment/Plan:  ESRD on HD: Patient follows with Dr. Joelyn Oms and performs HD at home 5 times per week. Patient has missed at least two of his sessions in the last 3 days. Patient is altered on presentation and short of breath. Vitals reveal patient is normotensive, mildly tachpneic, satting 99% on non-rebreather. He does appear to be volume overloaded on examination. CXR shows poor inspiration with mild bibasilar atelectasis. Labs reveal hypernatremia at 151, creatinine of 20.70, GFR of 3, potassium normal at 3.8, alkalosis with bicarb of 48. We will proceed with emergent dialysis. -Renal function panel tomorrow morning  -Proceed with emergent hemodialysis  Altered Mental Status: Patient is minimally arousable on examination, but will open eyes briefly to voice. Vital signs are stable, labs as above. No leukocytosis. DDx includes uremia, SDH, seizure disorder, toxin related, overdose (patient is on opioids and benzodiazepines at home).  -Emergent HD -Work up per primary (pursuing Head CT, UDS, ethanol levels, TSH, EEG, BCx)  History of Myoclonic Jerks: Previously seen in ED and by family medicine for disorder. Worse when patient misses HD. Obvious and frequent on examination.  -Per primary  HTN: Currently normotensive. Not on medications at home.  -Monitor while on HD.   Osa Craver, DO PGY-2 Internal Medicine Resident Pager # [phone] 08/07/2014 4:40 PM

## 2014-08-07 NOTE — ED Provider Notes (Signed)
CSN: [id number]     Arrival date & time 08/07/14  1320 History   First MD Initiated Contact with Patient 08/07/14 1322     Chief Complaint  Patient presents with  . Altered Mental Status     (Consider location/radiation/quality/duration/timing/severity/associated sxs/prior Treatment) HPI..... Level V caveat for altered mental status, weakness, lethargy. Patient is on home dialysis 5 days a week. His wife noticed altered mentation last night. No prodromal illnesses. He normally has tremors. No stiff neck, sore throat, cough  Past Medical History  Diagnosis Date  . Dialysis patient   . Sleep apnea     cpap-   . Anxiety   . Kidney failure     on dialysis since 2011- [facility]- nephrologist   . GERD (gastroesophageal reflux disease)   . High blood pressure     hx of   . Acute meniscal tear of left knee   . Acute meniscal tear of right knee   . Chronic back pain     due to weight   . Hemodialysis patient     since 2011, does hemodialysis at home, wife does she is a [employer] boy , Daily except wed and sun   Past Surgical History  Procedure Laterality Date  . Av fistula repair    . Av fistula placement      left arm -   . Stents in left lower forearm       due to clotting in Av fistula   . Av fistula right wrist     . Av fistula right upper arm     . Thrombectomies to left lower foreram fistula     . 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 Laparoscopic gastric sleeve resection N/A 05/05/2014    Procedure: LAPAROSCOPIC GASTRIC SLEEVE RESECTION;  Surgeon: Luretha Murphy, MD;  Location: [facility];  Service: General;  Laterality: N/A;  . Peripheral vascular catheterization N/A 06/02/2014    Procedure: A/V Shuntogram/Fistulagram;  Surgeon: Annice Needy, MD;  Location: [facility];  Service: Cardiovascular;  Laterality: N/A;  . Peripheral vascular catheterization N/A 06/02/2014    Procedure: A/V Shunt Intervention;  Surgeon: Annice Needy, MD;  Location: [facility];   Service: Cardiovascular;  Laterality: N/A;  . Peripheral vascular catheterization N/A 06/02/2014    Procedure: Dialysis/Perma Catheter Insertion;  Surgeon: Annice Needy, MD;  Location: [facility];  Service: Cardiovascular;  Laterality: N/A;  . Av fistula placement Left 06/26/2014    Procedure: Left arm AV fistula creation;  Surgeon: Annice Needy, MD;  Location: [facility] OR[facility];  Service: Vascular;  Laterality: Left;   Family History  Problem Relation Age of Onset  . High blood pressure Mother    History  Substance Use Topics  . Smoking status: Heavy Tobacco Smoker -- 0.50 packs/day for 20 years    Types: Cigarettes    Last Attempt to Quit: 06/17/2013  . Smokeless tobacco: Never Used  . Alcohol Use: No    Review of Systems  Unable to perform ROS: Acuity of condition      Allergies  Review of patient's allergies indicates no known allergies.  Home Medications   Prior to Admission medications   Medication Sig Start Date End Date Taking? Authorizing Provider  alprazolam (XANAX) 2 MG tablet TAKE ONE TABLET BY MOUTH AT BEDTIME AS NEEDED FOR SLEEP **REFILLS  MUST  BE  AT  LEAST  30  DAYS  APART** 04/29/14  Yes Donita Brooks, MD  apixaban (ELIQUIS) 2.5 MG TABS tablet Take 2.5 mg by mouth 2 (two) times daily.   Yes Historical Provider, MD  aspirin 81 MG tablet Take 81 mg by mouth daily.   Yes Historical Provider, MD  calcitRIOL (ROCALTROL) 0.25 MCG capsule Take 0.75 mcg by mouth 3 (three) times daily with meals.    Yes Historical Provider, MD  cinacalcet (SENSIPAR) 60 MG tablet Take 120 mg by mouth every evening.    Yes Historical Provider, MD  escitalopram (LEXAPRO) 10 MG tablet Take 1 tablet (10 mg total) by mouth daily. 06/09/14  Yes Donita Brooks, MD  HYDROcodone-acetaminophen (NORCO) 5-325 MG per tablet Take 1 tablet by mouth every 6 (six) hours as needed for moderate pain. 06/26/14  Yes Annice Needy, MD  LORazepam (ATIVAN) 0.5 MG tablet Take 1 tablet (0.5 mg total) by mouth every 8  (eight) hours. 06/27/14  Yes Blane Ohara, MD  multivitamin (RENA-VIT) TABS tablet Take 1 tablet by mouth at bedtime.    Yes Historical Provider, MD  Oxycodone HCl 10 MG TABS Take 10 mg by mouth every 4 (four) hours as needed (for pain).    Yes Historical Provider, MD  sevelamer (RENVELA) 800 MG tablet Take 3,200 mg by mouth 3 (three) times daily with meals.    Yes Historical Provider, MD  acetaminophen (TYLENOL) 325 MG tablet Take 650 mg by mouth every 6 (six) hours as needed for mild pain or moderate pain.    Historical Provider, MD   BP 146/110 mmHg  Pulse 83  Resp 22  SpO2 99% Physical Exam  Constitutional:  Obese, will open eyes to direct questions, but is confused.  Jerking in extremities periodically  HENT:  Head: Normocephalic and atraumatic.  Eyes: Conjunctivae and EOM are normal. Pupils are equal, round, and reactive to light.  Neck: Normal range of motion. Neck supple.  Cardiovascular: Normal rate and regular rhythm.   Pulmonary/Chest: Effort normal and breath sounds normal.  Abdominal: Soft. Bowel sounds are normal.  Musculoskeletal:  unable  Neurological:  Not alert  Skin:  Peripheral edema  Psychiatric:  unable  Nursing note and vitals reviewed.   ED Course  Procedures (including critical care time) Labs Review Labs Reviewed  COMPREHENSIVE METABOLIC PANEL - Abnormal; Notable for the following:    Sodium 151 (*)    Chloride 76 (*)    CO2 48 (*)    Glucose, Bld 105 (*)    BUN 50 (*)    Creatinine, Ser 20.70 (*)    Calcium 6.6 (*)    Albumin 2.8 (*)    ALT 13 (*)    GFR calc non Af Amer 2 (*)    GFR calc Af Amer 3 (*)    Anion gap 27 (*)    All other components within normal limits  CBC - Abnormal; Notable for the following:    RBC 3.23 (*)    Hemoglobin 9.7 (*)    HCT 33.3 (*)    MCV 103.1 (*)    MCHC 29.1 (*)    RDW 18.6 (*)    Platelets 401 (*)    All other components within normal limits  CULTURE, BLOOD (ROUTINE X 2)  CULTURE, BLOOD (ROUTINE X  2)  TROPONIN I  URINALYSIS, ROUTINE W REFLEX MICROSCOPIC (NOT AT [facility])  CBG MONITORING, ED  I-STAT CG4 LACTIC ACID, ED    Imaging Review Dg Chest Port 1 View  08/07/2014   CLINICAL DATA:  Altered mental status.  EXAM: PORTABLE CHEST - 1 VIEW  COMPARISON:  11/16/2013.  FINDINGS: Interval left jugular double-lumen catheter with its tip in the superior vena cava or azygos arch. Interval right subclavian and axillary stents. Poor inspiration. Mild bibasilar atelectasis. Mildly enlarged cardiac silhouette with a mild increase in size, accentuated by the poor inspiration. Minimal left glenohumeral joint degenerative changes.  IMPRESSION: Poor inspiration with mild bibasilar atelectasis.   Electronically Signed   By: Beckie Salts M.D.   On: 08/07/2014 13:46     EKG Interpretation   Date/Time:  Thursday August 07 2014 13:18:17 EDT Ventricular Rate:  82 PR Interval:  161 QRS Duration: 88 QT Interval:  464 QTC Calculation: 542 R Axis:   5 Text Interpretation:  Sinus rhythm Ventricular premature complex Low  voltage, precordial leads Nonspecific T abnormalities, lateral leads  Prolonged QT interval Confirmed by Adriana Simas  MD, Zed Wanninger ([id number]) on 08/07/2014  2:32:09 PM     CRITICAL CARE Performed by: Donnetta Hutching Total critical care time: 30 Critical care time was exclusive of separately billable procedures and treating other patients. Critical care was necessary to treat or prevent imminent or life-threatening deterioration. Critical care was time spent personally by me on the following activities: development of treatment plan with patient and/or surrogate as well as nursing, discussions with consultants, evaluation of patient's response to treatment, examination of patient, obtaining history from patient or surrogate, ordering and performing treatments and interventions, ordering and review of laboratory studies, ordering and review of radiographic studies, pulse oximetry and re-evaluation of patient's  condition. MDM   Final diagnoses:  Renal failure    Discuss with Dr. Hyman Hopes in nephrology. He will be sent for immediate dialysis. Admit to general medicine. Discussed with Dr. Jonathon Jordan in family medicine.    Donnetta Hutching, MD 08/07/14 [id number]

## 2014-08-08 ENCOUNTER — Inpatient Hospital Stay (HOSPITAL_COMMUNITY): Payer: Medicare Other

## 2014-08-08 DIAGNOSIS — D638 Anemia in other chronic diseases classified elsewhere: Secondary | ICD-10-CM

## 2014-08-08 DIAGNOSIS — E870 Hyperosmolality and hypernatremia: Secondary | ICD-10-CM

## 2014-08-08 DIAGNOSIS — R569 Unspecified convulsions: Secondary | ICD-10-CM | POA: Insufficient documentation

## 2014-08-08 LAB — RENAL FUNCTION PANEL
ALBUMIN: 2.8 g/dL — AB (ref 3.5–5.0)
ANION GAP: 19 — AB (ref 5–15)
BUN: 32 mg/dL — AB (ref 6–20)
CO2: 38 mmol/L — ABNORMAL HIGH (ref 22–32)
CREATININE: 15.44 mg/dL — AB (ref 0.61–1.24)
Calcium: 8 mg/dL — ABNORMAL LOW (ref 8.9–10.3)
Chloride: 90 mmol/L — ABNORMAL LOW (ref 101–111)
GFR calc Af Amer: 4 mL/min — ABNORMAL LOW (ref >60)
GFR calc non Af Amer: 3 mL/min — ABNORMAL LOW (ref >60)
GLUCOSE: 86 mg/dL (ref 65–99)
PHOSPHORUS: 8.5 mg/dL — AB (ref 2.5–4.6)
POTASSIUM: 3.6 mmol/L (ref 3.5–5.1)
Sodium: 147 mmol/L — ABNORMAL HIGH (ref 135–145)

## 2014-08-08 LAB — HEPATITIS B SURFACE ANTIGEN: HEP B S AG: NEGATIVE

## 2014-08-08 LAB — SALICYLATE LEVEL: Salicylate Lvl: <4 mg/dL (ref 2.8–30.0)

## 2014-08-08 LAB — AMMONIA: Ammonia: 15 umol/L (ref 9–35)

## 2014-08-08 MED ORDER — DEXTROSE 5 % IV SOLN
1500.0000 mg | Freq: Once | INTRAVENOUS | Status: AC
  Administered 2014-08-08: 1500 mg via INTRAVENOUS
  Filled 2014-08-08: qty 15

## 2014-08-08 MED ORDER — CHLORHEXIDINE GLUCONATE 0.12 % MT SOLN
15.0000 mL | Freq: Two times a day (BID) | OROMUCOSAL | Status: DC
  Administered 2014-08-08 – 2014-08-11 (×6): 15 mL via OROMUCOSAL
  Filled 2014-08-08 (×9): qty 15

## 2014-08-08 MED ORDER — UNABLE TO FIND: Status: DC

## 2014-08-08 MED ORDER — DEXTROSE 5 % IV SOLN
500.0000 mg | Freq: Three times a day (TID) | INTRAVENOUS | Status: DC
  Administered 2014-08-08 – 2014-08-10 (×5): 500 mg via INTRAVENOUS
  Filled 2014-08-08 (×8): qty 5

## 2014-08-08 MED ORDER — CETYLPYRIDINIUM CHLORIDE 0.05 % MT LIQD
7.0000 mL | Freq: Two times a day (BID) | OROMUCOSAL | Status: DC
  Administered 2014-08-09 – 2014-08-11 (×5): 7 mL via OROMUCOSAL

## 2014-08-08 NOTE — Progress Notes (Signed)
EEG completed; results pending.    

## 2014-08-08 NOTE — Telephone Encounter (Signed)
rx faxed to [facility].

## 2014-08-08 NOTE — Consult Note (Signed)
NEURO HOSPITALIST CONSULT NOTE   Referring physician: Forrestine Him   Reason for Consult: Myoclonus   HPI:                                                                                                                                          Perry [name] is an 44 y.o. male with Kidney failure on dialysis 5 times a week.  patient is encephalopathic presently and unable to give me a good history. Per chart, patient has been noted to have jerking motion for the past couple days.  EMS was called and patietn was brought to  ED from [facility] for decreased responsiveness, weakness, and lethargy. Per history patient has also been falling recently at home. Patient's wife noted altered mental status last night at home. In ED patient continues to be altered, lethargic and somnolent with visible jerks. Patient was admitted to hospital . It was noted that his myoclonic jerk worsen when he misses dialysis and he has recently missed three days of dialysis. On admission his sodium was 151, BUN 105, Cr 20, CO2 48.  EEG was ordered and noted to show "occasional generalized epileptiform discharges, some with triphasic-like appearance. Some of these are associated with body jerks  There were 3 episodes of arm jerking captured as described above, with diffuse semi-rhythmic delta activity followed by return to baseline along with rare independent right frontal sharp waves.  Neurology was consulted to assess need for AED.    Past Medical History  Diagnosis Date  . Dialysis patient   . Sleep apnea     cpap-   . Anxiety   . Kidney failure     on dialysis since 2011- [facility]- nephrologist   . GERD (gastroesophageal reflux disease)   . High blood pressure     hx of   . Acute meniscal tear of left knee   . Acute meniscal tear of right knee   . Chronic back pain     due to weight   . Hemodialysis patient     since 2011, does hemodialysis at home, wife does she is a [employer] boy , Daily except  wed and sun    Past Surgical History  Procedure Laterality Date  . Av fistula repair    . Av fistula placement      left arm -   . Stents in left lower forearm       due to clotting in Av fistula   . Av fistula right wrist     . Av fistula right upper arm     . Thrombectomies to left lower foreram fistula     . 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 Laparoscopic gastric sleeve resection N/A  05/05/2014    Procedure: LAPAROSCOPIC GASTRIC SLEEVE RESECTION;  Surgeon: Luretha Murphy, MD;  Location: [facility] ORS;  Service: General;  Laterality: N/A;  . Peripheral vascular catheterization N/A 06/02/2014    Procedure: A/V Shuntogram/Fistulagram;  Surgeon: Annice Needy, MD;  Location: [facility] INVASIVE CV LAB;  Service: Cardiovascular;  Laterality: N/A;  . Peripheral vascular catheterization N/A 06/02/2014    Procedure: A/V Shunt Intervention;  Surgeon: Annice Needy, MD;  Location: [facility] INVASIVE CV LAB;  Service: Cardiovascular;  Laterality: N/A;  . Peripheral vascular catheterization N/A 06/02/2014    Procedure: Dialysis/Perma Catheter Insertion;  Surgeon: Annice Needy, MD;  Location: [facility] INVASIVE CV LAB;  Service: Cardiovascular;  Laterality: N/A;  . Av fistula placement Left 06/26/2014    Procedure: Left arm AV fistula creation;  Surgeon: Annice Needy, MD;  Location: [facility] ORS;  Service: Vascular;  Laterality: Left;    Family History  Problem Relation Age of Onset  . High blood pressure Mother    Social History:  reports that he has been smoking Cigarettes.  He has a 10 pack-year smoking history. He has never used smokeless tobacco. He reports that he does not drink alcohol or use illicit drugs.  No Known Allergies  MEDICATIONS:                                                                                                                     Prior to Admission:  Prescriptions prior to admission  Medication Sig Dispense Refill Last Dose  . alprazolam (XANAX) 2 MG tablet TAKE ONE TABLET BY  MOUTH AT BEDTIME AS NEEDED FOR SLEEP **REFILLS  MUST  BE  AT  LEAST  30  DAYS  APART** 30 tablet 2 Past Week at Unknown time  . apixaban (ELIQUIS) 2.5 MG TABS tablet Take 2.5 mg by mouth 2 (two) times daily.   08/06/2014 at Unknown time  . aspirin 81 MG tablet Take 81 mg by mouth daily.   08/06/2014 at Unknown time  . calcitRIOL (ROCALTROL) 0.25 MCG capsule Take 0.75 mcg by mouth 3 (three) times daily with meals.    Past Week at Unknown time  . cinacalcet (SENSIPAR) 60 MG tablet Take 120 mg by mouth every evening.    08/06/2014 at Unknown time  . escitalopram (LEXAPRO) 10 MG tablet Take 1 tablet (10 mg total) by mouth daily. 30 tablet 3 Past Week at Unknown time  . HYDROcodone-acetaminophen (NORCO) 5-325 MG per tablet Take 1 tablet by mouth every 6 (six) hours as needed for moderate pain. 30 tablet 0 Past Week at Unknown time  . LORazepam (ATIVAN) 0.5 MG tablet Take 1 tablet (0.5 mg total) by mouth every 8 (eight) hours. 10 tablet 0 08/06/2014 at Unknown time  . multivitamin (RENA-VIT) TABS tablet Take 1 tablet by mouth at bedtime.    08/06/2014 at Unknown time  . Oxycodone HCl 10 MG TABS Take 10 mg by mouth every 4 (four) hours as needed (for pain).    Past Week at  Unknown time  . sevelamer (RENVELA) 800 MG tablet Take 3,200 mg by mouth 3 (three) times daily with meals.    Past Week at Unknown time  . acetaminophen (TYLENOL) 325 MG tablet Take 650 mg by mouth every 6 (six) hours as needed for mild pain or moderate pain.   Unk   Scheduled: . antiseptic oral rinse  7 mL Mouth Rinse q12n4p  . apixaban  2.5 mg Oral BID  . aspirin  81 mg Oral Daily  . chlorhexidine  15 mL Mouth Rinse BID  . valproate sodium  1,500 mg Intravenous Once  . valproate sodium  500 mg Intravenous 3 times per day     ROS:                                                                                                                                       History obtained from unobtainable from patient due to mental  status    Blood pressure 112/66, pulse 80, temperature 98.6 F (37 C), temperature source Oral, resp. rate 12, weight 124.2 kg (273 lb 13 oz), SpO2 98 %.   Neurologic Examination:                                                                                                      HEENT-  Normocephalic, no lesions, without obvious abnormality.  Normal external eye and conjunctiva.  Normal TM's bilaterally.  Normal auditory canals and external ears. Normal external nose, mucus membranes and septum.  Normal pharynx. Cardiovascular- S1, S2 normal, pulses palpable throughout   Lungs- chest clear, no wheezing, rales, normal symmetric air entry Abdomen- normal findings: bowel sounds normal Extremities- no edema Lymph-no adenopathy palpable Musculoskeletal-no joint tenderness, deformity or swelling Skin-warm and dry, no hyperpigmentation, vitiligo, or suspicious lesions  Neurological Examination Mental Status: Patient is drowsy but awakens when his name is called. Able to tell me his name but then drift off and unable to complete his thought. Able to follow simple commands such as smiling, squeezing my hand and looking to the left and right.  Speech fluent without evidence of aphasia.   Cranial Nerves: II: Discs flat bilaterally; blinks to threat bilaterally, pupils equal, round, reactive to light and accommodation III,IV, VI: ptosis not present, extra-ocular motions intact bilaterally V,VII: smile symmetric, facial light touch sensation normal bilaterally VIII: hearing normal bilaterally IX,X: uvula rises symmetrically XI: bilateral shoulder shrug XII: midline tongue extension Motor: Moves all extremities antigravity but has difficulty keeping them antigravity  due to myoclonic jerks.  He has both UE and LE myoclonus.  Sensory: Pinprick and light touch intact throughout, bilaterally Deep Tendon Reflexes: 2+ and symmetric throughout UE and KJ no AJ Plantars: Right: downgoing   Left:  downgoing Cerebellar: Unable to obtain due encephalopathic state Gait: Not assessed due to patient receiving dialysis.       Lab Results: Basic Metabolic Panel:  Recent Labs Lab 08/07/14 1320 08/07/14 1855 08/08/14 0242  NA 151* 151* 147*  K 3.8 3.5 3.6  CL 76* 75* 90*  CO2 48* 49* 38*  GLUCOSE 105* 88 86  BUN 50* 48* 32*  CREATININE 20.70* 20.91* 15.44*  CALCIUM 6.6* 6.8* 8.0*  MG 2.8*  --   --   PHOS  --  12.0* 8.5*    Liver Function Tests:  Recent Labs Lab 08/07/14 1320 08/07/14 1855 08/08/14 0242  AST 26  --   --   ALT 13*  --   --   ALKPHOS 69  --   --   BILITOT 0.5  --   --   PROT 6.6  --   --   ALBUMIN 2.8* 2.8* 2.8*   No results for input(s): LIPASE, AMYLASE in the last 168 hours. No results for input(s): AMMONIA in the last 168 hours.  CBC:  Recent Labs Lab 08/07/14 1320 08/07/14 1850  WBC 9.0 10.1  HGB 9.7* 9.4*  HCT 33.3* 32.8*  MCV 103.1* 103.8*  PLT 401* 410*    Cardiac Enzymes:  Recent Labs Lab 08/07/14 1320  TROPONINI <0.03    Lipid Panel: No results for input(s): CHOL, TRIG, HDL, CHOLHDL, VLDL, LDLCALC in the last 168 hours.  CBG:  Recent Labs Lab 08/07/14 1327  GLUCAP 92    Microbiology: Results for orders placed or performed during the hospital encounter of 08/07/14  Culture, blood (routine x 2)     Status: None (Preliminary result)   Collection Time: 08/07/14  2:20 PM  Result Value Ref Range Status   Specimen Description BLOOD RIGHT ANTECUBITAL  Final   Special Requests BOTTLES DRAWN AEROBIC AND ANAEROBIC 10CC  Final   Culture PENDING  Incomplete   Report Status PENDING  Incomplete  Culture, blood (routine x 2)     Status: None (Preliminary result)   Collection Time: 08/07/14  2:30 PM  Result Value Ref Range Status   Specimen Description BLOOD RIGHT ARM  Final   Special Requests BOTTLES DRAWN AEROBIC ONLY 10CC  Final   Culture PENDING  Incomplete   Report Status PENDING  Incomplete    Coagulation  Studies: No results for input(s): LABPROT, INR in the last 72 hours.  Imaging: Ct Head Wo Contrast  08/07/2014   CLINICAL DATA:  Weakness, lethargy  EXAM: CT HEAD WITHOUT CONTRAST  TECHNIQUE: Contiguous axial images were obtained from the base of the skull through the vertex without intravenous contrast.  COMPARISON:  06/06/2012  FINDINGS: No skull fracture is noted. Paranasal sinuses and mastoid air cells are unremarkable. No intracranial hemorrhage, mass effect or midline shift. No acute cortical infarction. No mass lesion is noted on this unenhanced scan. The gray and white-matter differentiation is preserved.  IMPRESSION: No acute intracranial abnormality.  No significant change.   Electronically Signed   By: Natasha Mead M.D.   On: 08/07/2014 16:31   Dg Chest Port 1 View  08/07/2014   CLINICAL DATA:  Altered mental status.  EXAM: PORTABLE CHEST - 1 VIEW  COMPARISON:  11/16/2013.  FINDINGS: Interval left jugular double-lumen  catheter with its tip in the superior vena cava or azygos arch. Interval right subclavian and axillary stents. Poor inspiration. Mild bibasilar atelectasis. Mildly enlarged cardiac silhouette with a mild increase in size, accentuated by the poor inspiration. Minimal left glenohumeral joint degenerative changes.  IMPRESSION: Poor inspiration with mild bibasilar atelectasis.   Electronically Signed   By: Beckie Salts M.D.   On: 08/07/2014 13:46       Assessment and plan per attending neurologist  Felicie Morn PA-C [facility] [phone]  08/08/2014, 12:26 PM   Assessment/Plan: 44 year old male who presents with altered mental status in the setting of likely heavy salicylate use, possible antiacid use. EEG is concerning for possible seizures, though encephalopathic pattern at think is possible.  I would favor treatment with remedying the underlying issue as well as using antiepileptics briefly until the underlying issue is addressed.   1) Depakote 1500 mg  1 followed by 500 mg 3 times a day  2) continue to address the underlying metabolic issues 3) neurology will continue to follow  Ritta Slot, MD [facility] [phone]  If 7pm- 7am, please page neurology on call as listed in [facility]ION.

## 2014-08-08 NOTE — Discharge Instructions (Addendum)
You were hospitalized for altered mental status, probably due to missing dialysis. It is very important that you have dialysis 5 days a week as prescribed. It is also important to take only as much sodium bicarbonate as prescribed, and NO Alka Seltzer.   Your antibiotics (vancomycin) are available only at [facility] in the [facility] [place]). Take this medicine as prescribed for 11 more days.

## 2014-08-08 NOTE — Consult Note (Signed)
I was present at this dialysis session. I have reviewed the session itself and made appropriate changes.   Sabra Heck  MD 08/08/2014, 1:25 PM

## 2014-08-08 NOTE — Progress Notes (Addendum)
Admit: 08/07/2014 LOS: 1  33M ESRD admit with AMS, myoclonus/seizures, and profound METABOLIC ALKALOSIS  Subjective:  2h HD yesterday Remains very altered, awakens and speaks but drifts off, cont myoclonus EEG this AM       [facility]   08/07/14 1942 08/07/14 2027  Weight: 124.2 kg (273 lb 13 oz) 124.2 kg (273 lb 13 oz)    Scheduled Meds: . antiseptic oral rinse  7 mL Mouth Rinse q12n4p  . apixaban  2.5 mg Oral BID  . aspirin  81 mg Oral Daily  . chlorhexidine  15 mL Mouth Rinse BID   Continuous Infusions:  PRN Meds:.sodium chloride, sodium chloride, feeding supplement (NEPRO CARB STEADY), heparin, lidocaine (PF), lidocaine-prilocaine, pentafluoroprop-tetrafluoroeth  Current Labs: reviewed    Physical Exam:  Blood pressure 108/72, pulse 84, temperature 98.7 F (37.1 C), temperature source Oral, resp. rate 9, weight 124.2 kg (273 lb 13 oz), SpO2 96 %. Drifts in and out of beign awake, occasionally answers questions RRR CTAB TDC bandaged w/o purulent material No LEE  A/P 1. ESRD 1. No fever/leukocytosis to suggest TDC related infection 2. HD again today for alkalosis  3. MIght need to return to center based HD 2. AMS 1. Suspect metabolic in nature, profound metabolic alkalosis 2. No ABG upon admission 3. EEG complete 4. Needs to see neuro 5. Check NH3, serum drug screen 3. HTN/Vol 1. Has chronic hypotension 2. Chronic high IDWG 4. Metabolic Alkalosis 1. Long standing history of excessive NaHCO3 intake despite numerous discussions to stop using 2. He says was using, ? Veracity but would make sense 3. Could consider some problem with making dialysate at home (or were they using sacks) 4. Serum HCO3 was 26 on 7/11 5. Chronic TDC 1. 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 Using [facility], follows with [facility] 3. As above 6. Myoclonus +/- Seizures 7. Chronic opiate and benzodiazepine dependence 8. S/p gastric bipass  UPDATE: In talking with home dilaysis unit pt has not just used  NaHCO3 as antacic but also has chosen to use large large doses of alka selzer,  WIll check Salicylate level, try to add one on as well.    Sabra Heck MD 08/08/2014, 10:07 AM   Recent Labs Lab 08/07/14 1320 08/07/14 1855 08/08/14 0242  NA 151* 151* 147*  K 3.8 3.5 3.6  CL 76* 75* 90*  CO2 48* 49* 38*  GLUCOSE 105* 88 86  BUN 50* 48* 32*  CREATININE 20.70* 20.91* 15.44*  CALCIUM 6.6* 6.8* 8.0*  PHOS  --  12.0* 8.5*    Recent Labs Lab 08/07/14 1320 08/07/14 1850  WBC 9.0 10.1  HGB 9.7* 9.4*  HCT 33.3* 32.8*  MCV 103.1* 103.8*  PLT 401* 410*

## 2014-08-08 NOTE — Procedures (Signed)
ELECTROENCEPHALOGRAM REPORT  Date of Study: 08/08/2014  Patient's Name: Perry [name] MRN: [id number] Date of Birth: February 19, 1970  Referring Provider: Dr. Janit Pagan  Clinical History: This is a 44 year old man with a recent diagnosis of myoclonic jerks admitted for decreased responsiveness, weakness, and lethargy. Received dialysis last night.  Medications: apixaban (ELIQUIS) tablet 2.5 mg aspirin tablet 81 mg  Technical Summary: A multichannel digital EEG recording measured by the international 10-20 system with electrodes applied with paste and impedances below 5000 ohms performed as portable with EKG monitoring in a predominantly drowsy and asleep patient.  Hyperventilation and photic stimulation were not performed.  The digital EEG was referentially recorded, reformatted, and digitally filtered in a variety of bipolar and referential montages for optimal display.   Description: The patient is predominantly drowsy and asleep during the recording. There is no clear posterior dominant rhythm. The background consists of a large amount of diffuse 4-5 Hz theta and 2-3 Hz delta slowing with sleep architecture noted. There are vertex waves and poorly formed sleep spindles seen. Some of the vertex waves have a more epileptiform appearance, at times with vertex asymmetry noted more on the left parasagittal derivations. There are occasional generalized epileptiform discharges seen, some with triphasic-like appearance, others with frontal predominance. There are rare independent sharp waves seen over the right frontal region. Patient is noted to have upper body jerks during the study, some with no EEG correlate, others with associated generalized discharge preceding the body jerk. There were 3 episodes of arm jerking seen with associated EEG correlate: At 05:35 minutes into the recording, patient is noted to have asynchronous bilateral arm jerking. Electrographically, there is higher amplitude  diffuse semi-rhythmic 2-4 Hz activity lasting 40 seconds before return to baseline. At 07:38 minutes, he is again noted to have arm jerking, with EEG showing similar diffuse semi-rhythmic 2-4 Hz activity lasting 50 seconds before return to baseline. There are scattered sharp waves seen over the frontal regions. At 34:54 minutes, he is again noted to have right arm jerking, grunting, with EEG showing higher amplitude diffuse 2-4 Hz slowing, possibly more on the left, lasting 3 minutes. There are occasional high amplitude generalized discharges seen during this time period, with note of body jerks.  EKG lead was unremarkable.  Impression: This predominantly drowsy and asleep EEG is abnormal due to the presence of: 1. Moderate diffuse slowing of the background 2. Occasional generalized epileptiform discharges, some with triphasic-like appearance. Some of these are associated with body jerks 3. There were 3 episodes of arm jerking captured as described above, with diffuse semi-rhythmic delta activity followed by return to baseline 4. There were rare independent right frontal sharp waves  Clinical Correlation of the above findings indicates diffuse cerebral dysfunction that is non-specific in etiology and can be seen with hypoxic/ischemic injury, toxic/metabolic encephalopathies, or medication effect. There is possible epileptogenic potential over the right frontal region. There were also epileptiform discharges in a generalized fashion and episodes of myoclonic jerks with diffuse semi-rhythmic slowing concerning for clinico-electrographic seizures.    Patrcia Dolly, M.D.

## 2014-08-08 NOTE — Progress Notes (Signed)
Utilization Review Completed.  

## 2014-08-09 ENCOUNTER — Inpatient Hospital Stay (HOSPITAL_COMMUNITY): Payer: Medicare Other

## 2014-08-09 DIAGNOSIS — R4182 Altered mental status, unspecified: Secondary | ICD-10-CM

## 2014-08-09 DIAGNOSIS — A047 Enterocolitis due to Clostridium difficile: Secondary | ICD-10-CM

## 2014-08-09 DIAGNOSIS — A0472 Enterocolitis due to Clostridium difficile, not specified as recurrent: Secondary | ICD-10-CM | POA: Insufficient documentation

## 2014-08-09 LAB — RENAL FUNCTION PANEL
ALBUMIN: 2.9 g/dL — AB (ref 3.5–5.0)
Anion gap: 16 — ABNORMAL HIGH (ref 5–15)
BUN: 18 mg/dL (ref 6–20)
CO2: 30 mmol/L (ref 22–32)
CREATININE: 10.14 mg/dL — AB (ref 0.61–1.24)
Calcium: 8.2 mg/dL — ABNORMAL LOW (ref 8.9–10.3)
Chloride: 95 mmol/L — ABNORMAL LOW (ref 101–111)
GFR calc Af Amer: 6 mL/min — ABNORMAL LOW (ref >60)
GFR, EST NON AFRICAN AMERICAN: 6 mL/min — AB (ref >60)
Glucose, Bld: 76 mg/dL (ref 65–99)
PHOSPHORUS: 4.7 mg/dL — AB (ref 2.5–4.6)
Potassium: 3.6 mmol/L (ref 3.5–5.1)
SODIUM: 141 mmol/L (ref 135–145)

## 2014-08-09 MED ORDER — VANCOMYCIN 50 MG/ML ORAL SOLUTION
125.0000 mg | Freq: Four times a day (QID) | ORAL | Status: DC
  Administered 2014-08-09 – 2014-08-11 (×9): 125 mg via ORAL
  Filled 2014-08-09 (×12): qty 2.5

## 2014-08-09 MED ORDER — LORAZEPAM 2 MG/ML IJ SOLN
1.0000 mg | Freq: Once | INTRAMUSCULAR | Status: AC
  Administered 2014-08-09: 1 mg via INTRAVENOUS
  Filled 2014-08-09: qty 1

## 2014-08-09 MED ORDER — RENA-VITE PO TABS
1.0000 | ORAL_TABLET | Freq: Every day | ORAL | Status: DC
  Administered 2014-08-09 – 2014-08-10 (×2): 1 via ORAL
  Filled 2014-08-09 (×4): qty 1

## 2014-08-09 MED ORDER — HEPARIN SODIUM (PORCINE) 1000 UNIT/ML DIALYSIS: 20.0000 [IU]/kg | INTRAMUSCULAR | Status: DC | PRN

## 2014-08-09 NOTE — Progress Notes (Signed)
Hemodialysis- Discontinued after pt became overly agitated, got out of bed and began screaming at a nurse. Remains confused but follows commands. Tearful, screaming "take me off" and shaking fist. Treatment discontinued for fear of harm to himself and staff. Catheter capped and taped.

## 2014-08-09 NOTE — Progress Notes (Signed)
Admit: 08/07/2014 LOS: 2  53M ESRD admit with AMS, myoclonus/seizures, and profound METABOLIC ALKALOSIS  Subjective:  Full HD yesterday Salicylate level during SECOND Hd was normal Alkalosis resolved NH3 WNL MRI WNL NEURO note reviewed B Cx 7/21 NGTD Several BMs, C diff pending -- Recent Cephalexin Rx for AVF wound infection More jovial, fully awake this AM, back to normal MS  07/22 0701 - 07/23 0700 In: 110 [IV Piggyback:110] Out: 2000   Filed Weights   08/07/14 2027 08/08/14 1216 08/08/14 1606  Weight: 124.2 kg (273 lb 13 oz) 125.4 kg (276 lb 7.3 oz) 122.6 kg (270 lb 4.5 oz)    Scheduled Meds: . antiseptic oral rinse  7 mL Mouth Rinse q12n4p  . apixaban  2.5 mg Oral BID  . aspirin  81 mg Oral Daily  . chlorhexidine  15 mL Mouth Rinse BID  . valproate sodium  500 mg Intravenous 3 times per day   Continuous Infusions:  PRN Meds:.sodium chloride, sodium chloride, feeding supplement (NEPRO CARB STEADY), heparin, lidocaine (PF), lidocaine-prilocaine, pentafluoroprop-tetrafluoroeth  Current Labs: reviewed  7/21 B Cx x2 NGTD 7/23 C Diff pending   Physical Exam:  Blood pressure 127/74, pulse 97, temperature 97.8 F (36.6 C), temperature source Oral, resp. rate 18, weight 122.6 kg (270 lb 4.5 oz), SpO2 93 %. Drifts in and out of beign awake, occasionally answers questions RRR CTAB TDC bandaged w/o purulent material No LEE  A/P 1. ESRD 1. No fever/leukocytosis to suggest TDC related infection 2. HD today given encephalopathy 3. MIght need to return to center based HD 2. AMS 1. Suspect metabolic in nature, profound metabolic alkalosis 2. No ABG upon admission 3. EEG complete 4. Neuro eval complete 5. ? If pt with salicylate toxicity (no objective evidence) but known heavy intake of alka-selzer in month preceding admission.   1. Salicylate level > 24h into admission and at time of 2nd HD negative 2. Rec proper treatment even if present 3. HTN/Vol 1. Has chronic  hypotension 2. Chronic high IDWG 4. Metabolic Alkalosis; improving 1. Long standing history of excessive NaHCO3 intake despite numerous discussions to stop using 2. He says was using, ? Veracity but would make sense; confirmed with home HD RNs as well 3. Could consider some problem with making dialysate at home (or were they using sacks) -- DOUBT 4. Serum HCO3 was 26 on 7/11 5. Chronic TDC 1. 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 Using [facility], follows with [facility] 3. As above 6. 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 Chronic opiate and benzodiazepine dependence 8. S/p gastric bipass  Sabra Heck MD 08/09/2014, 8:34 AM   Recent Labs Lab 08/07/14 1855 08/08/14 0242 08/09/14 0304  NA 151* 147* 141  K 3.5 3.6 3.6  CL 75* 90* 95*  CO2 49* 38* 30  GLUCOSE 88 86 76  BUN 48* 32* 18  CREATININE 20.91* 15.44* 10.14*  CALCIUM 6.8* 8.0* 8.2*  PHOS 12.0* 8.5* 4.7*    Recent Labs Lab 08/07/14 1320 08/07/14 1850  WBC 9.0 10.1  HGB 9.7* 9.4*  HCT 33.3* 32.8*  MCV 103.1* 103.8*  PLT 401* 410*

## 2014-08-09 NOTE — Procedures (Signed)
I was present at this session.  I have reviewed the session itself and made appropriate changes.  Hd Via PC. Second day in a row.  bp low 100s  Shivonne Schwartzman L 7/23/20162:26 PM

## 2014-08-09 NOTE — Progress Notes (Signed)
Family Medicine Teaching Service Daily Progress Note Intern Pager: [phone]  Patient name: Perry [name] Medical record number: [id number] Date of birth: July 27, 1970 Age: 44 y.o. Gender: male  Primary Care Provider: Leo Grosser, MD Consultants: Nephrology, neurology Code Status: FULL  Pt Overview and Major Events to Date:  07/21: emergent HD 07/22: negative brain MRI; second dialysis session  Assessment and Plan: Perry [name] is a 44 y.o. male presenting with altered mental status. PMH is significant for ESRD on dialysis  AMS. Intracranial bleed ruled out with negative head CT. Must also consider toxic metabolite build-up (patient with similar presentation 3 months ago that resolved with dialysis). Must also consider ingestion/overdose . Seizures also possible, especially given findings of EEG, though no prior history of seizure disorder (prior seizure occurred in setting of poor HD access). No fevers, chills, or leukocytosis to suggest infection. Sepsis less likely (1/4 SIRS (RR), q-SOFA 1/3, lactate wnl). Given imaging and labs to date, most likely due to profound metabolic alkalosis from missing three days of HD.  - EtOH negative, Hep B negative, salicylate WNL - TSH WNL - Blood cultures - no growth <24 hr - EEG showed generalized epileptiform activity - Brain MRI - no acute intracranial process - Per neuro, continue depakote for one week - Per nephro, HD again today  ESRD on Dialysis. Electrolytes relatively stable on admission. Undergoes HD 5 times weekly at home. - HD today - Per nephro, may need center-based dialysis upon discharge  Depression / Anxiety. On home lexapro, xanax, ativan, norco - Hold home medications  Prolonged QTc. QTc 542 on admission EKG. - Hold lexapro. Continue decreasing dose - Mg 2.8 - Monitor   Hypernatremia: Resolved. Na 151 on arrival - Improved to 141 after two dialysis sessions - Monitor   Diarrhea: C.diff positve - Enteric  precautions  FEN/GI: NPO, SLIV Prophylaxis: home Eliquis  Disposition: home pending medical improvement   Subjective:  Perry [name] is much improved today. He was very alert and talkative when I entered the room. He denies any pain or confusion. He still reports diarrhea. He feels well enough to go home.   Objective: Temp:  [97.8 F (36.6 C)-98.9 F (37.2 C)] 97.8 F (36.6 C) (07/23 0753) Pulse Rate:  [70-97] 97 (07/23 0753) Resp:  [11-18] 18 (07/23 0753) BP: (86-127)/(26-74) 127/74 mmHg (07/23 0753) SpO2:  [93 %-100 %] 93 % (07/23 0753) Weight:  [270 lb 4.5 oz (122.6 kg)-276 lb 7.3 oz (125.4 kg)] 270 lb 4.5 oz (122.6 kg) (07/22 1606) Physical Exam: General: jovial obese male lying in bed, very talkative  HEENT: PERRLA Cardiovascular: RRR, no murmurs appreciated Respiratory: CTAB, no wheezes Abdomen: soft, non-tender, non-distended, +BS Extremities: no jerking Neuro: no focal deficits Psych: appropriate mood and affect   Laboratory:  Recent Labs Lab 08/07/14 1320 08/07/14 1850  WBC 9.0 10.1  HGB 9.7* 9.4*  HCT 33.3* 32.8*  PLT 401* 410*    Recent Labs Lab 08/07/14 1320 08/07/14 1855 08/08/14 0242 08/09/14 0304  NA 151* 151* 147* 141  K 3.8 3.5 3.6 3.6  CL 76* 75* 90* 95*  CO2 48* 49* 38* 30  BUN 50* 48* 32* 18  CREATININE 20.70* 20.91* 15.44* 10.14*  CALCIUM 6.6* 6.8* 8.0* 8.2*  PROT 6.6  --   --   --   BILITOT 0.5  --   --   --   ALKPHOS 69  --   --   --   ALT 13*  --   --   --  AST 26  --   --   --   GLUCOSE 105* 88 86 76    Imaging/Diagnostic Tests: Ct Head Wo Contrast  08/07/2014   CLINICAL DATA:  Weakness, lethargy  EXAM: CT HEAD WITHOUT CONTRAST  TECHNIQUE: Contiguous axial images were obtained from the base of the skull through the vertex without intravenous contrast.  COMPARISON:  06/06/2012  FINDINGS: No skull fracture is noted. Paranasal sinuses and mastoid air cells are unremarkable. No intracranial hemorrhage, mass effect or midline shift.  No acute cortical infarction. No mass lesion is noted on this unenhanced scan. The gray and white-matter differentiation is preserved.  IMPRESSION: No acute intracranial abnormality.  No significant change.   Electronically Signed   By: Natasha Mead M.D.   On: 08/07/2014 16:31   Mr Brain Wo Contrast  08/09/2014   CLINICAL DATA:  Initial evaluation for acute altered mental status.  EXAM: MRI HEAD WITHOUT CONTRAST  TECHNIQUE: Multiplanar, multiecho pulse sequences of the brain and surrounding structures were obtained without intravenous contrast.  COMPARISON:  Prior CT from 08/07/2014 and MRI from 10/10/2012.  FINDINGS: The CSF containing spaces are within normal limits for patient age. No focal parenchymal signal abnormality is identified. No mass lesion, midline shift, or extra-axial fluid collection. Ventricles are normal in size without evidence of hydrocephalus.  No diffusion-weighted signal abnormality is identified to suggest acute intracranial infarct. Gray-white matter differentiation is maintained. Normal flow voids are seen within the intracranial vasculature. No intracranial hemorrhage identified.  The cervicomedullary junction is normal. Pituitary gland is within normal limits. Pituitary stalk is midline. The globes and optic nerves demonstrate a normal appearance with normal signal intensity. Hippocampi by are normal in morphology with normal signal intensity bilaterally. Evaluation somewhat limited by motion artifact.  The bone marrow signal intensity is normal. Calvarium is intact. Visualized upper cervical spine is within normal limits.  Scalp soft tissues are unremarkable.  Paranasal sinuses are clear.  No mastoid effusion.  IMPRESSION: Normal brain MRI with no acute intracranial process identified.   Electronically Signed   By: Rise Mu M.D.   On: 08/09/2014 01:34   Dg Chest Port 1 View  08/07/2014   CLINICAL DATA:  Altered mental status.  EXAM: PORTABLE CHEST - 1 VIEW  COMPARISON:   11/16/2013.  FINDINGS: Interval left jugular double-lumen catheter with its tip in the superior vena cava or azygos arch. Interval right subclavian and axillary stents. Poor inspiration. Mild bibasilar atelectasis. Mildly enlarged cardiac silhouette with a mild increase in size, accentuated by the poor inspiration. Minimal left glenohumeral joint degenerative changes.  IMPRESSION: Poor inspiration with mild bibasilar atelectasis.   Electronically Signed   By: Beckie Salts M.D.   On: 08/07/2014 13:46     Carden Teel Shelbie Hutching, MD 08/09/2014, 11:57 AM PGY-1, [facility] Family Medicine FPTS Intern pager: [phone], text pages welcome

## 2014-08-09 NOTE — Progress Notes (Signed)
Family Medicine Teaching Service Daily Progress Note Intern Pager: [phone]  Patient name: Perry [name] Medical record number: [id number] Date of birth: Mar 21, 1970 Age: 44 y.o. Gender: male  Primary Care Provider: Leo Grosser, MD Consultants: Nephrology, neurology Code Status: FULL  Pt Overview and Major Events to Date:  07/21: emergent HD 07/22: negative brain MRI; second dialysis session  Assessment and Plan: [name] is a 44 y.o. male presenting with altered mental status. PMH is significant for ESRD on dialysis  AMS. Intracranial bleed ruled out with negative head CT. Must also consider toxic metabolite build-up (patient with similar presentation 3 months ago that resolved with dialysis). Must also consider ingestion/overdose . Seizures also possible, especially given findings of EEG, though no prior history of seizure disorder (prior seizure occurred in setting of poor HD access). No fevers, chills, or leukocytosis to suggest infection. Sepsis less likely (1/4 SIRS (RR), q-SOFA 1/3, lactate wnl). Given imaging and labs to date, most likely due to profound metabolic alkalosis from missing three days of HD.  - EtOH negative, Hep B negative, salicylate WNL - TSH WNL - Blood cultures - no growth <24 hr - EEG showed generalized epileptiform activity - Brain MRI - no acute intracranial process - Per neuro, continue depakote for one week - Per nephro, HD again today  ESRD on Dialysis. Electrolytes relatively stable on admission. Undergoes HD 5 times weekly at home. - HD today - Per nephro, may need center-based dialysis upon discharge  Depression / Anxiety. On home lexapro, xanax, ativan, norco - Hold home medications  Prolonged QTc. QTc 542 on admission EKG. - Hold lexapro. Continue decreasing dose - Mg 2.8 - Monitor   Hypernatremia: Resolved. Na 151 on arrival - Improved to 141 after two dialysis sessions - Monitor   Diarrhea - C.diff negative - Enteric  precautions  FEN/GI: NPO, SLIV Prophylaxis: home Eliquis  Disposition: home pending medical improvement   Subjective:  Perry [name] is much improved today. He was very alert and talkative when I entered the room. He denies any pain or confusion. He still reports diarrhea. He feels well enough to go home.   Objective: Temp:  [97.8 F (36.6 C)-98.9 F (37.2 C)] 97.8 F (36.6 C) (07/23 0753) Pulse Rate:  [70-97] 97 (07/23 0753) Resp:  [11-18] 18 (07/23 0753) BP: (86-127)/(26-74) 127/74 mmHg (07/23 0753) SpO2:  [93 %-100 %] 93 % (07/23 0753) Weight:  [270 lb 4.5 oz (122.6 kg)-276 lb 7.3 oz (125.4 kg)] 270 lb 4.5 oz (122.6 kg) (07/22 1606) Physical Exam: General: jovial obese male lying in bed, very talkative  HEENT: PERRLA Cardiovascular: RRR, no murmurs appreciated Respiratory: CTAB, no wheezes Abdomen: soft, non-tender, non-distended, +BS Extremities: no jerking Neuro: no focal deficits Psych: appropriate mood and affect   Laboratory:  Recent Labs Lab 08/07/14 1320 08/07/14 1850  WBC 9.0 10.1  HGB 9.7* 9.4*  HCT 33.3* 32.8*  PLT 401* 410*    Recent Labs Lab 08/07/14 1320 08/07/14 1855 08/08/14 0242 08/09/14 0304  NA 151* 151* 147* 141  K 3.8 3.5 3.6 3.6  CL 76* 75* 90* 95*  CO2 48* 49* 38* 30  BUN 50* 48* 32* 18  CREATININE 20.70* 20.91* 15.44* 10.14*  CALCIUM 6.6* 6.8* 8.0* 8.2*  PROT 6.6  --   --   --   BILITOT 0.5  --   --   --   ALKPHOS 69  --   --   --   ALT 13*  --   --   --  AST 26  --   --   --   GLUCOSE 105* 88 86 76    Imaging/Diagnostic Tests: Ct Head Wo Contrast  08/07/2014   CLINICAL DATA:  Weakness, lethargy  EXAM: CT HEAD WITHOUT CONTRAST  TECHNIQUE: Contiguous axial images were obtained from the base of the skull through the vertex without intravenous contrast.  COMPARISON:  06/06/2012  FINDINGS: No skull fracture is noted. Paranasal sinuses and mastoid air cells are unremarkable. No intracranial hemorrhage, mass effect or midline shift.  No acute cortical infarction. No mass lesion is noted on this unenhanced scan. The gray and white-matter differentiation is preserved.  IMPRESSION: No acute intracranial abnormality.  No significant change.   Electronically Signed   By: Natasha Mead M.D.   On: 08/07/2014 16:31   Mr Brain Wo Contrast  08/09/2014   CLINICAL DATA:  Initial evaluation for acute altered mental status.  EXAM: MRI HEAD WITHOUT CONTRAST  TECHNIQUE: Multiplanar, multiecho pulse sequences of the brain and surrounding structures were obtained without intravenous contrast.  COMPARISON:  Prior CT from 08/07/2014 and MRI from 10/10/2012.  FINDINGS: The CSF containing spaces are within normal limits for patient age. No focal parenchymal signal abnormality is identified. No mass lesion, midline shift, or extra-axial fluid collection. Ventricles are normal in size without evidence of hydrocephalus.  No diffusion-weighted signal abnormality is identified to suggest acute intracranial infarct. Gray-white matter differentiation is maintained. Normal flow voids are seen within the intracranial vasculature. No intracranial hemorrhage identified.  The cervicomedullary junction is normal. Pituitary gland is within normal limits. Pituitary stalk is midline. The globes and optic nerves demonstrate a normal appearance with normal signal intensity. Hippocampi by are normal in morphology with normal signal intensity bilaterally. Evaluation somewhat limited by motion artifact.  The bone marrow signal intensity is normal. Calvarium is intact. Visualized upper cervical spine is within normal limits.  Scalp soft tissues are unremarkable.  Paranasal sinuses are clear.  No mastoid effusion.  IMPRESSION: Normal brain MRI with no acute intracranial process identified.   Electronically Signed   By: Rise Mu M.D.   On: 08/09/2014 01:34   Dg Chest Port 1 View  08/07/2014   CLINICAL DATA:  Altered mental status.  EXAM: PORTABLE CHEST - 1 VIEW  COMPARISON:   11/16/2013.  FINDINGS: Interval left jugular double-lumen catheter with its tip in the superior vena cava or azygos arch. Interval right subclavian and axillary stents. Poor inspiration. Mild bibasilar atelectasis. Mildly enlarged cardiac silhouette with a mild increase in size, accentuated by the poor inspiration. Minimal left glenohumeral joint degenerative changes.  IMPRESSION: Poor inspiration with mild bibasilar atelectasis.   Electronically Signed   By: Beckie Salts M.D.   On: 08/07/2014 13:46     Chidi Shirer Shelbie Hutching, MD 08/09/2014, 10:07 AM PGY-1, [facility] Family Medicine FPTS Intern pager: [phone], text pages welcome

## 2014-08-09 NOTE — Discharge Summary (Signed)
Family Medicine Teaching [facility] Discharge Summary  Patient name: Perry [name] Medical record number: [id number] Date of birth: 07/21/1970 Age: 44 y.o. Gender: male Date of Admission: 08/07/2014  Date of Discharge: 08/11/14 Admitting Physician: Doreene Eland, MD  Primary Care Provider: Leo Grosser, MD Consultants: Nephro, neuro  Indication for Hospitalization: altered mental status  Discharge Diagnoses/Problem List:  Patient Active Problem List   Diagnosis Date Noted  . Enteritis due to Clostridium difficile   . Hypernatremia   . Convulsion   . ESRD (end stage renal disease) 08/07/2014  . Altered mental status 08/07/2014  . 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 Epigastric abdominal pain   . Abdominal pain, epigastric 11/17/2013  . Hypotension 11/17/2013  . Abdominal pain 11/16/2013  . Seizures 06/06/2012  . ESRD on dialysis 06/06/2012  . Anemia of chronic disease 06/06/2012  . Secondary hyperparathyroidism (of renal origin) 06/06/2012  . HTN (hypertension) 06/06/2012  . 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 His wife reported that he missed three dialysis sessions in the past week, and had hit his head three times in the past two days. Head CT was negative to rule out intracranial bleed, subsequent MRI brain also showed no acute abnormalities.  Patient's electrolytes and renal function improved with emergent HD, however his mental status remained unchanged. EEG showed generalized epileptiform activities, so he was started on  Depakote. He improved, however during his third dialysis session (day #3 of admission) he began hallucinating and became violent during HD. He continued to hallucinate intermittently after that. Neurology thought AMS may be due to depakote, so they discontinued that. His mental status improved at this point, with no additional hallucinations.  Once his labs had normalized and he was no longer mentally altered, he was discharged home with instructions to continue home dialysis 5 days a week.   Issues for Follow Up:  1. Per nephro, can continue home dialysis for now, but may need to consider center-based dialysis if another episode like this one occurs. Concern that patient may not be making dialysate properly.  2.  Given C.diff positive, patient sent home with 11 days of PO vanc to complete 2 week course.  3.   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    Significant Procedures: none  Significant Labs and Imaging:  CT head (07/21) - no acute intracranial abnormality   MRI brain (07/23) - no acute intracranial abnormality  EEG (07/22) - generalized epileptiform activity    Recent Labs Lab 08/07/14 1320 08/07/14 1850  WBC 9.0 10.1  HGB 9.7* 9.4*  HCT 33.3* 32.8*  PLT 401* 410*    Recent Labs Lab 08/07/14 1320 08/07/14 1855 08/08/14 0242 08/09/14 0304 08/10/14 0351  NA 151* 151* 147* 141 138  K 3.8 3.5 3.6 3.6 3.1*  CL 76* 75* 90* 95* 95*  CO2 48* 49* 38* 30 26  GLUCOSE 105* 88 86 76 74  BUN 50* 48* 32* 18 15  CREATININE 20.70* 20.91* 15.44* 10.14* 8.55*  CALCIUM 6.6* 6.8* 8.0* 8.2* 8.7*  MG 2.8*  --   --   --   --  PHOS  --  12.0* 8.5* 4.7* 4.0  ALKPHOS 69  --   --   --   --   AST 26  --   --   --   --   ALT 13*  --   --   --   --   ALBUMIN 2.8* 2.8* 2.8* 2.9* 3.2*    Results/Tests Pending at Time of Discharge: None  Discharge Medications:    Medication List    TAKE these medications         acetaminophen 325 MG tablet  Commonly known as:  TYLENOL  Take 650 mg by mouth every 6 (six) hours as needed for mild pain or moderate pain.     alprazolam 2 MG tablet  Commonly known as:  XANAX  TAKE ONE TABLET BY MOUTH AT BEDTIME AS NEEDED FOR SLEEP **REFILLS  MUST  BE  AT  LEAST  30  DAYS  APART**     aspirin 81 MG tablet  Take 81 mg by mouth daily.     calcitRIOL 0.25 MCG capsule  Commonly known as:  ROCALTROL  Take 0.75 mcg by mouth 3 (three) times daily with meals.     cinacalcet 60 MG tablet  Commonly known as:  SENSIPAR  Take 120 mg by mouth every evening.     ELIQUIS 2.5 MG Tabs tablet  Generic drug:  apixaban  Take 2.5 mg by mouth 2 (two) times daily.     escitalopram 10 MG tablet  Commonly known as:  LEXAPRO  Take 1 tablet (10 mg total) by mouth daily.     HYDROcodone-acetaminophen 5-325 MG per tablet  Commonly known as:  NORCO  Take 1 tablet by mouth every 6 (six) hours as needed for moderate pain.     LORazepam 0.5 MG tablet  Commonly known as:  ATIVAN  Take 1 tablet (0.5 mg total) by mouth every 8 (eight) hours.     multivitamin Tabs tablet  Take 1 tablet by mouth at bedtime.     Oxycodone HCl 10 MG Tabs  Take 10 mg by mouth every 4 (four) hours as needed (for pain).     pantoprazole 20 MG tablet  Commonly known as:  PROTONIX  Take 1 tablet (20 mg total) by mouth daily.     sevelamer carbonate 800 MG tablet  Commonly known as:  RENVELA  Take 3,200 mg by mouth 3 (three) times daily with meals.     UNABLE TO FIND  Dispense one Standard Wheelchair to fit patient.     vancomycin 50 mg/mL oral solution  Commonly known as:  VANCOCIN  Take 2.5 mLs (125 mg total) by mouth 4 (four) times daily.        Discharge Instructions: Please refer to Patient Instructions section of EMR for full details.  Patient was counseled important signs and symptoms that should prompt return to medical care, changes in medications, dietary instructions, activity  restrictions, and follow up appointments.    Marquette Saa, MD 08/11/2014, 6:07 PM PGY-1, [facility] Family Medicine

## 2014-08-09 NOTE — Progress Notes (Signed)
Subjective: Much improved  Exam: Filed Vitals:   08/09/14 0753  BP: 127/74  Pulse: 97  Temp: 97.8 F (36.6 C)  Resp: 18   Gen: In bed, NAD Resp: non-labored breathing, no acute distress Abd: soft, nt  Neuro: MS: awake, alert, oriented to place, but not month or year. Making jokes.  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 I suspect tha tthis is all related to his metabolic abnormalities and as these improve would expect his mentation to improve as well.   I do not think he will need long term AED therapy based on these events, but would continue depakote for about a week. 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 It would be symptomatic treatment and therefore could decide based on level of disability the myoclonus is causing.   Recommendations: 1) continue depakote  TID.  2) will continue to follow.   Ritta Slot, MD [facility] [phone]  If 7pm- 7am, please page neurology on call as listed in AMION.

## 2014-08-10 LAB — RENAL FUNCTION PANEL
Albumin: 3.2 g/dL — ABNORMAL LOW (ref 3.5–5.0)
Anion gap: 17 — ABNORMAL HIGH (ref 5–15)
BUN: 15 mg/dL (ref 6–20)
CHLORIDE: 95 mmol/L — AB (ref 101–111)
CO2: 26 mmol/L (ref 22–32)
Calcium: 8.7 mg/dL — ABNORMAL LOW (ref 8.9–10.3)
Creatinine, Ser: 8.55 mg/dL — ABNORMAL HIGH (ref 0.61–1.24)
GFR calc non Af Amer: 7 mL/min — ABNORMAL LOW (ref >60)
GFR, EST AFRICAN AMERICAN: 8 mL/min — AB (ref >60)
GLUCOSE: 74 mg/dL (ref 65–99)
PHOSPHORUS: 4 mg/dL (ref 2.5–4.6)
POTASSIUM: 3.1 mmol/L — AB (ref 3.5–5.1)
SODIUM: 138 mmol/L (ref 135–145)

## 2014-08-10 LAB — CLOSTRIDIUM DIFFICILE BY PCR: CDIFFPCR: POSITIVE — AB

## 2014-08-10 MED ORDER — PANTOPRAZOLE SODIUM 20 MG PO TBEC
20.0000 mg | DELAYED_RELEASE_TABLET | Freq: Every day | ORAL | Status: DC
  Administered 2014-08-10 – 2014-08-11 (×2): 20 mg via ORAL
  Filled 2014-08-10 (×3): qty 1

## 2014-08-10 MED ORDER — NICOTINE 14 MG/24HR TD PT24
14.0000 mg | MEDICATED_PATCH | Freq: Every day | TRANSDERMAL | Status: DC
  Administered 2014-08-10 – 2014-08-11 (×3): 14 mg via TRANSDERMAL
  Filled 2014-08-10 (×3): qty 1

## 2014-08-10 NOTE — Progress Notes (Addendum)
Family Medicine Teaching Service Daily Progress Note Intern Pager: [phone]  Patient name: Perry [name] Medical record number: [id number] Date of birth: 1970-12-14 Age: 44 y.o. Gender: male  Primary Care Provider: Leo Grosser, MD Consultants: Nephrology, neurology Code Status: FULL  Pt Overview and Major Events to Date:  07/21: emergent HD 07/22: negative brain MRI; second dialysis session  Assessment and Plan: Perry [name] is a 44 y.o. male presenting with altered mental status. PMH is significant for ESRD on dialysis  AMS. Intracranial bleed ruled out with negative head CT. Most likely due totoxic metabolite build-up on admission (patient with similar presentation 3 months ago that resolved with dialysis). No fevers, chills, or leukocytosis to suggest infection. Sepsis less likely (1/4 SIRS (RR), q-SOFA 1/3, lactate wnl). Given imaging and labs to date, most likely due to profound metabolic alkalosis from missing three days of HD; required emergent HD. AMS improved since admission (oriented x2) but now with hallucinations likely caused by delirium.   - EtOH negative, Hep B negative, salicylate WNL - TSH WNL - Blood cultures - no growth <24 hr - EEG showed generalized epileptiform activity - patient with seizures on admission due to metabolic alkolosis - Brain MRI - no acute intracranial process - Per neuro, was started on Depakote for seizures but may be adding to AMS as patient now with hallucinations; will stop Depakote  -monitor   ESRD on Dialysis. Electrolytes relatively stable on admission. Undergoes HD 5 times weekly at home. - next HD tomorrow -of note, patient aggressive/disruptive yesterday at HD and was cut short - received Ativan - Per nephro, may need center-based dialysis upon discharge - needs to be discussed with family  Depression / Anxiety. On home lexapro, xanax, ativan, norco - Hold home medications  Prolonged QTc. QTc 542 on admission EKG. - Hold  lexapro. Continue decreasing dose - Mg 2.8 - Monitor; repeat EKG today  Hypernatremia: Resolved. Na 151 on arrival - Monitor   Diarrhea: C.diff positve - Enteric precautions - On PO vanc  FEN/GI: Renal diet, SLIV Prophylaxis: home Eliquis  Disposition: home pending medical improvement   Subjective:  Perry [name] is much improved today. He was very alert and talkative when I entered the room. He denies any pain.   Wife present on presentation to room; states patient hallucinating and she thinks this is due to the seizure medication. Says he was not doing this prior to medication.   Objective: Temp:  [97.2 F (36.2 C)-98.3 F (36.8 C)] 98 F (36.7 C) (07/24 0400) Pulse Rate:  [82-115] 103 (07/24 0400) Resp:  [11-28] 11 (07/24 0400) BP: (107-158)/(32-88) 107/40 mmHg (07/24 0400) SpO2:  [92 %-100 %] 95 % (07/24 0400) Weight:  [258 lb 6.1 oz (117.2 kg)-266 lb 1.5 oz (120.7 kg)] 258 lb 6.1 oz (117.2 kg) (07/23 1722) Physical Exam: General: jovial obese male, sitting in bedside chair eating breakfast, talkative, alert NAD Cardiovascular: RRR, no murmurs appreciated Respiratory: CTAB, no wheezes Abdomen: soft, non-tender, non-distended, +BS Extremities: no jerking, no edema Neuro: A&Ox2 Psych: appropriate mood and affect, hallucinating   Laboratory:  Recent Labs Lab 08/07/14 1320 08/07/14 1850  WBC 9.0 10.1  HGB 9.7* 9.4*  HCT 33.3* 32.8*  PLT 401* 410*    Recent Labs Lab 08/07/14 1320  08/08/14 0242 08/09/14 0304 08/10/14 0351  NA 151*  < > 147* 141 138  K 3.8  < > 3.6 3.6 3.1*  CL 76*  < > 90* 95* 95*  CO2 48*  < >  38* 30 26  BUN 50*  < > 32* 18 15  CREATININE 20.70*  < > 15.44* 10.14* 8.55*  CALCIUM 6.6*  < > 8.0* 8.2* 8.7*  PROT 6.6  --   --   --   --   BILITOT 0.5  --   --   --   --   ALKPHOS 69  --   --   --   --   ALT 13*  --   --   --   --   AST 26  --   --   --   --   GLUCOSE 105*  < > 86 76 74  < > = values in this interval not  displayed.  Imaging/Diagnostic Tests: Ct Head Wo Contrast  08/07/2014   CLINICAL DATA:  Weakness, lethargy  EXAM: CT HEAD WITHOUT CONTRAST  TECHNIQUE: Contiguous axial images were obtained from the base of the skull through the vertex without intravenous contrast.  COMPARISON:  06/06/2012  FINDINGS: No skull fracture is noted. Paranasal sinuses and mastoid air cells are unremarkable. No intracranial hemorrhage, mass effect or midline shift. No acute cortical infarction. No mass lesion is noted on this unenhanced scan. The gray and white-matter differentiation is preserved.  IMPRESSION: No acute intracranial abnormality.  No significant change.   Electronically Signed   By: Perry [name] M.D.   On: 08/07/2014 16:31   Mr Brain Wo Contrast  08/09/2014   CLINICAL DATA:  Initial evaluation for acute altered mental status.  EXAM: MRI HEAD WITHOUT CONTRAST  TECHNIQUE: Multiplanar, multiecho pulse sequences of the brain and surrounding structures were obtained without intravenous contrast.  COMPARISON:  Prior CT from 08/07/2014 and MRI from 10/10/2012.  FINDINGS: The CSF containing spaces are within normal limits for patient age. No focal parenchymal signal abnormality is identified. No mass lesion, midline shift, or extra-axial fluid collection. Ventricles are normal in size without evidence of hydrocephalus.  No diffusion-weighted signal abnormality is identified to suggest acute intracranial infarct. Gray-white matter differentiation is maintained. Normal flow voids are seen within the intracranial vasculature. No intracranial hemorrhage identified.  The cervicomedullary junction is normal. Pituitary gland is within normal limits. Pituitary stalk is midline. The globes and optic nerves demonstrate a normal appearance with normal signal intensity. Hippocampi by are normal in morphology with normal signal intensity bilaterally. Evaluation somewhat limited by motion artifact.  The bone marrow signal intensity is normal.  Calvarium is intact. Visualized upper cervical spine is within normal limits.  Scalp soft tissues are unremarkable.  Paranasal sinuses are clear.  No mastoid effusion.  IMPRESSION: Normal brain MRI with no acute intracranial process identified.   Electronically Signed   By: Rise Mu M.D.   On: 08/09/2014 01:34   Dg Chest Port 1 View  08/07/2014   CLINICAL DATA:  Altered mental status.  EXAM: PORTABLE CHEST - 1 VIEW  COMPARISON:  11/16/2013.  FINDINGS: Interval left jugular double-lumen catheter with its tip in the superior vena cava or azygos arch. Interval right subclavian and axillary stents. Poor inspiration. Mild bibasilar atelectasis. Mildly enlarged cardiac silhouette with a mild increase in size, accentuated by the poor inspiration. Minimal left glenohumeral joint degenerative changes.  IMPRESSION: Poor inspiration with mild bibasilar atelectasis.   Electronically Signed   By: Beckie Salts M.D.   On: 08/07/2014 13:46     Pincus Large, DO 08/10/2014, 7:35 AM PGY-2, [facility] FPTS Intern pager: [phone], text pages welcome

## 2014-08-10 NOTE — Progress Notes (Signed)
Subjective: Has been having some formed hallucinations(e.g. Bugs, patterns on the floor)  Exam: Filed Vitals:   08/10/14 0800  BP:   Pulse:   Temp: 98.3 F (36.8 C)  Resp:    Gen: In bed, NAD Resp: non-labored breathing, no acute distress Abd: soft, nt  Neuro: MS: awake, alert, oriented to month, year, but gives roundabout answer to place([place]'s all the same). 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 I suspect tha tthis is all related to his metabolic abnormalities and as these improve would expect his mentation to improve as well.   Given family's concerns regarding depakote and the fact that the provoking cause of his seizures(metabolic abnormalities) has been greatly improved, I think stopping it would be reasonable.   His current hallucinations, however, I feel are more likely a multifactorial delirium.   Recommendations: 1) discontinue depakote 2) will continue to follow.   Ritta Slot, MD [facility] [phone]  If 7pm- 7am, please page neurology on call as listed in AMION.

## 2014-08-10 NOTE — Progress Notes (Signed)
Admit: 08/07/2014 LOS: 3  18M ESRD admit with AMS, myoclonus/seizures, and profound METABOLIC ALKALOSIS  Subjective:  HD yesterday curtailed with aggressive/disruptive behavior; 3h, 2.5L UF, 117kg? C Diff negative Seems to be having ideas of reference, ?visual hallucination, paranoia this AM; vague about events and endorses persecution.     07/23 0701 - 07/24 0700 In: 125 [P.O.:25; IV Piggyback:100] Out: 2465   Filed Weights   08/08/14 1606 08/09/14 1415 08/09/14 1722  Weight: 122.6 kg (270 lb 4.5 oz) 120.7 kg (266 lb 1.5 oz) 117.2 kg (258 lb 6.1 oz)    Scheduled Meds: . antiseptic oral rinse  7 mL Mouth Rinse q12n4p  . apixaban  2.5 mg Oral BID  . aspirin  81 mg Oral Daily  . chlorhexidine  15 mL Mouth Rinse BID  . multivitamin  1 tablet Oral QHS  . nicotine  14 mg Transdermal Daily  . valproate sodium  500 mg Intravenous 3 times per day  . vancomycin  125 mg Oral QID   Continuous Infusions:  PRN Meds:.  Current Labs: reviewed  7/21 B Cx x2 NGTD 7/23 C Diff negative   Physical Exam:  Blood pressure 107/40, pulse 103, temperature 98 F (36.7 C), temperature source Oral, resp. rate 11, weight 117.2 kg (258 lb 6.1 oz), SpO2 95 %. Having delusions of paranoia RRR CTAB TDC bandaged w/o purulent material No LEE  A/P 1. ESRD 1. No fever/leukocytosis to suggest TDC related infection 2. Next HD 7/25 2. AMS 1. Now some features of altered sensorium / psychosis -- MIGHT need PSYCH eval 2. Metabolic issues resolved 3. EEG complete 4. Neuro eval complete 5. ? If pt with salicylate toxicity (no objective evidence) but known heavy intake of alka-selzer in month preceding admission.   1. Salicylate level > 24h into admission and at time of 2nd HD negative 2. Rec proper treatment even if present 3. HTN/Vol 1. Has chronic hypotension 2. Chronic high IDWG 4. Metabolic Alkalosis; resolved 1. Long standing history of excessive NaHCO3 intake despite numerous discussions to  stop using 2. Serum HCO3 was 26 on 7/11 3. 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 Using [facility], follows with [facility] 3. As above 6. 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 Chronic opiate and benzodiazepine dependence 8. S/p gastric bipass  Sabra Heck MD 08/10/2014, 6:46 AM   Recent Labs Lab 08/08/14 0242 08/09/14 0304 08/10/14 0351  NA 147* 141 138  K 3.6 3.6 3.1*  CL 90* 95* 95*  CO2 38* 30 26  GLUCOSE 86 76 74  BUN 32* 18 15  CREATININE 15.44* 10.14* 8.55*  CALCIUM 8.0* 8.2* 8.7*  PHOS 8.5* 4.7* 4.0    Recent Labs Lab 08/07/14 1320 08/07/14 1850  WBC 9.0 10.1  HGB 9.7* 9.4*  HCT 33.3* 32.8*  MCV 103.1* 103.8*  PLT 401* 410*

## 2014-08-11 ENCOUNTER — Encounter: Payer: Self-pay | Admitting: Family Medicine

## 2014-08-11 ENCOUNTER — Other Ambulatory Visit: Payer: Self-pay | Admitting: Family Medicine

## 2014-08-11 DIAGNOSIS — R410 Disorientation, unspecified: Secondary | ICD-10-CM

## 2014-08-11 LAB — MRSA PCR SCREENING: MRSA BY PCR: NEGATIVE

## 2014-08-11 MED ORDER — LORAZEPAM 0.5 MG PO TABS
0.5000 mg | ORAL_TABLET | Freq: Once | ORAL | Status: AC
  Administered 2014-08-11: 0.5 mg via ORAL
  Filled 2014-08-11: qty 1

## 2014-08-11 MED ORDER — PANTOPRAZOLE SODIUM 20 MG PO TBEC: 20.0000 mg | DELAYED_RELEASE_TABLET | Freq: Every day | ORAL | Status: DC

## 2014-08-11 MED ORDER — VANCOMYCIN 50 MG/ML ORAL SOLUTION: 125.0000 mg | Freq: Four times a day (QID) | ORAL | Status: DC

## 2014-08-11 NOTE — Progress Notes (Signed)
Admit: 08/07/2014 LOS: 4  29M ESRD admit with AMS, myoclonus/seizures, and profound METABOLIC ALKALOSIS  Subjective:  Was walking with family in the halls today. Fully oriented, no mention of any hallucinations  07/24 0701 - 07/25 0700 In: 840 [P.O.:840] Out: -   Filed Weights   08/09/14 1415 08/09/14 1722 08/10/14 1500  Weight: 120.7 kg (266 lb 1.5 oz) 117.2 kg (258 lb 6.1 oz) 119 kg (262 lb 5.6 oz)    Physical Exam:  Blood pressure 138/61, pulse 110, temperature 97.7 F (36.5 C), temperature source Oral, resp. rate 13, height  (1.702 m), weight 119 kg (262 lb 5.6 oz), SpO2 95 %. Alert, o x 3, ambulatory RRR CTAB TDC bandaged w/o purulent material No LEE   Assessment: 1. ESRD - HD today 2. AMS 1. Now some features of altered sensorium / psychosis -- MIGHT need PSYCH eval 2. Metabolic issues resolved 3. EEG complete 4. Neuro eval complete 5. ? If pt with salicylate toxicity (no objective evidence) but known heavy intake of alka-selzer in month preceding admission.   1. Salicylate level > 24h into admission and at time of 2nd HD negative 2. Rec proper treatment even if present 3. HTN/Vol 1. Has chronic hypotension 2. Chronic high IDWG 4. Metabolic Alkalosis; resolved 1. Long standing history of excessive NaHCO3 intake despite numerous discussions to stop using 2. Serum HCO3 was 26 on 7/11 3. 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 Using [facility], follows with [facility] 3. As above 6. 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 Chronic opiate and benzodiazepine dependence 8. S/p gastric bypass  Rec - if going to be dc'd today, he will do his own HD at home after dc. If not being dc'd, we will do HD here today.   Sabra Heck MD 08/11/2014, 9:16 AM   Recent Labs Lab 08/08/14 0242 08/09/14 0304 08/10/14 0351  NA 147* 141 138  K 3.6 3.6 3.1*  CL 90* 95* 95*  CO2 38* 30 26  GLUCOSE 86 76 74  BUN 32* 18 15   CREATININE 15.44* 10.14* 8.55*  CALCIUM 8.0* 8.2* 8.7*  PHOS 8.5* 4.7* 4.0    Recent Labs Lab 08/07/14 1320 08/07/14 1850  WBC 9.0 10.1  HGB 9.7* 9.4*  HCT 33.3* 32.8*  MCV 103.1* 103.8*  PLT 401* 410*

## 2014-08-11 NOTE — Care Management Important Message (Signed)
Important Message  Patient Details  Name: Perry [name] MRN: [id number] Date of Birth: 1970/09/14   Medicare Important Message Given:  Yes-second notification given    Magdalene River, RN 08/11/2014, 3:26 PM

## 2014-08-11 NOTE — Progress Notes (Signed)
Family Medicine Teaching Service Daily Progress Note Intern Pager: [phone]  Patient name: Perry [name] Medical record number: [id number] Date of birth: 09-25-1970 Age: 44 y.o. Gender: male  Primary Care Provider: Leo Grosser, MD Consultants: Nephro, neuro  Code Status: FULL  Assessment and Plan: MACSEN NUTTALL is a 44 y.o. male presenting with altered mental status. PMH is significant for ESRD on dialysis  AMS. Intracranial bleed ruled out with negative head CT. Most likely due totoxic metabolite build-up on admission (patient with similar presentation 3 months ago that resolved with dialysis). No fevers, chills, or leukocytosis to suggest infection. Sepsis less likely (1/4 SIRS (RR), q-SOFA 1/3, lactate wnl). Given imaging and labs to date, most likely due to profound metabolic alkalosis from missing three days of HD; required emergent HD. Continued to experience AMS with hallucinations, but has now improved.  - EtOH negative, Hep B negative, salicylate WNL - TSH WNL - Blood cultures - no growth <24 hr - EEG showed generalized epileptiform activity - Brain MRI - no acute intracranial process - Per neuro, d/c depakote given potential cause of continued AMS  ESRD on Dialysis. Electrolytes relatively stable on admission. Undergoes HD 5 times weekly at home. - Per nephro, can continue home dialysis today if discharged   Depression / Anxiety. On home lexapro, xanax, norco - Hold home medications - Resume home Ativan  Prolonged QTc. QTc 542 on admission EKG. - Hold lexapro - Mg 2.8  Hypernatremia: Resolved. Na 151 on arrival - Improved to 138 after two dialysis sessions  Diarrhea: C.diff positve - Enteric precautions  FEN/GI: NPO, SLIV Prophylaxis: home Eliquis  Disposition: home   Subjective:  Perry [name] reports that he is feeling much better today. His wife is present at bedside and says his mental status has not been altered at all this morning since she  arrived a few hours ago. I reiterated the importance of taking only the prescribed amount of sodium bicarb and not taking any Alka Seltzer. He says his diarrhea has lessened and he now has primarily well-formed stools.  Objective: Temp:  [97.7 F (36.5 C)-98.1 F (36.7 C)] 97.7 F (36.5 C) (07/25 0843) Pulse Rate:  [86-110] 110 (07/25 0843) Resp:  [13-24] 13 (07/25 0843) BP: (95-138)/(42-74) 138/61 mmHg (07/25 0843) SpO2:  [86 %-100 %] 95 % (07/25 0843) Weight:  [262 lb 5.6 oz (119 kg)] 262 lb 5.6 oz (119 kg) (07/24 1500) Physical Exam: General: jovial obese male sitting on edge of bed eating breakfast Cardiovascular: RRR, no murmurs appreciated Respiratory: CTAB, no wheezes Abdomen: soft, non-tender, non-distended, +BS Extremities: no jerking Neuro: no focal deficits Psych: appropriate mood and affect   Laboratory:  Recent Labs Lab 08/07/14 1320 08/07/14 1850  WBC 9.0 10.1  HGB 9.7* 9.4*  HCT 33.3* 32.8*  PLT 401* 410*    Recent Labs Lab 08/07/14 1320  08/08/14 0242 08/09/14 0304 08/10/14 0351  NA 151*  < > 147* 141 138  K 3.8  < > 3.6 3.6 3.1*  CL 76*  < > 90* 95* 95*  CO2 48*  < > 38* 30 26  BUN 50*  < > 32* 18 15  CREATININE 20.70*  < > 15.44* 10.14* 8.55*  CALCIUM 6.6*  < > 8.0* 8.2* 8.7*  PROT 6.6  --   --   --   --   BILITOT 0.5  --   --   --   --   ALKPHOS 69  --   --   --   --  ALT 13*  --   --   --   --   AST 26  --   --   --   --   GLUCOSE 105*  < > 86 76 74  < > = values in this interval not displayed.   Imaging/Diagnostic Tests: Ct Head Wo Contrast  08/07/2014   CLINICAL DATA:  Weakness, lethargy  EXAM: CT HEAD WITHOUT CONTRAST  TECHNIQUE: Contiguous axial images were obtained from the base of the skull through the vertex without intravenous contrast.  COMPARISON:  06/06/2012  FINDINGS: No skull fracture is noted. Paranasal sinuses and mastoid air cells are unremarkable. No intracranial hemorrhage, mass effect or midline shift. No acute cortical  infarction. No mass lesion is noted on this unenhanced scan. The gray and white-matter differentiation is preserved.  IMPRESSION: No acute intracranial abnormality.  No significant change.   Electronically Signed   By: Natasha Mead M.D.   On: 08/07/2014 16:31   Mr Brain Wo Contrast  08/09/2014   CLINICAL DATA:  Initial evaluation for acute altered mental status.  EXAM: MRI HEAD WITHOUT CONTRAST  TECHNIQUE: Multiplanar, multiecho pulse sequences of the brain and surrounding structures were obtained without intravenous contrast.  COMPARISON:  Prior CT from 08/07/2014 and MRI from 10/10/2012.  FINDINGS: The CSF containing spaces are within normal limits for patient age. No focal parenchymal signal abnormality is identified. No mass lesion, midline shift, or extra-axial fluid collection. Ventricles are normal in size without evidence of hydrocephalus.  No diffusion-weighted signal abnormality is identified to suggest acute intracranial infarct. Gray-white matter differentiation is maintained. Normal flow voids are seen within the intracranial vasculature. No intracranial hemorrhage identified.  The cervicomedullary junction is normal. Pituitary gland is within normal limits. Pituitary stalk is midline. The globes and optic nerves demonstrate a normal appearance with normal signal intensity. Hippocampi by are normal in morphology with normal signal intensity bilaterally. Evaluation somewhat limited by motion artifact.  The bone marrow signal intensity is normal. Calvarium is intact. Visualized upper cervical spine is within normal limits.  Scalp soft tissues are unremarkable.  Paranasal sinuses are clear.  No mastoid effusion.  IMPRESSION: Normal brain MRI with no acute intracranial process identified.   Electronically Signed   By: Rise Mu M.D.   On: 08/09/2014 01:34   Dg Chest Port 1 View  08/07/2014   CLINICAL DATA:  Altered mental status.  EXAM: PORTABLE CHEST - 1 VIEW  COMPARISON:  11/16/2013.   FINDINGS: Interval left jugular double-lumen catheter with its tip in the superior vena cava or azygos arch. Interval right subclavian and axillary stents. Poor inspiration. Mild bibasilar atelectasis. Mildly enlarged cardiac silhouette with a mild increase in size, accentuated by the poor inspiration. Minimal left glenohumeral joint degenerative changes.  IMPRESSION: Poor inspiration with mild bibasilar atelectasis.   Electronically Signed   By: Beckie Salts M.D.   On: 08/07/2014 13:46     Abigail Shelbie Hutching, MD 08/11/2014, 9:25 AM PGY-1, [facility] FPTS Intern pager: [phone], text pages welcome

## 2014-08-12 LAB — CULTURE, BLOOD (ROUTINE X 2)
CULTURE: NO GROWTH
Culture: NO GROWTH

## 2014-08-12 NOTE — Telephone Encounter (Signed)
LRF 04/29/14 #30 + 2.  LOV 07/03/14  OK refill?

## 2014-08-12 NOTE — Telephone Encounter (Signed)
ok 

## 2014-08-12 NOTE — Telephone Encounter (Signed)
Was just discharged from hospital.  DC summary says he is on ativan and xanax.  Shouldn't take them both.  I am okay with refill of xanax but I don't want him to take ativan with it.

## 2014-08-12 NOTE — Telephone Encounter (Signed)
lmtcb

## 2014-08-12 NOTE — Telephone Encounter (Signed)
Pt denies any Rx for Ativan, lorazepam, (per record RX was only for #10 06/27/14)  Also got Rx 07/28/14 for #40 q8prn from [facility].  Pt told Dr Tanya Nones OK with refill of Alprazolam as long as he is not taking Lorazepam.  Pt agrees says he does not have any more Lorazepam.  Was just discharged 08/11/14.  No new Rx's from that discharge.  One month refill called and pharmacy told to discontinue any lorazepam. Will check state report again with next refill to be sure no more fills of Lorazepam.

## 2014-08-13 DIAGNOSIS — M47817 Spondylosis without myelopathy or radiculopathy, lumbosacral region: Secondary | ICD-10-CM | POA: Diagnosis not present

## 2014-08-13 DIAGNOSIS — G8929 Other chronic pain: Secondary | ICD-10-CM | POA: Diagnosis not present

## 2014-08-13 DIAGNOSIS — M2550 Pain in unspecified joint: Secondary | ICD-10-CM | POA: Diagnosis not present

## 2014-08-13 DIAGNOSIS — N186 End stage renal disease: Secondary | ICD-10-CM | POA: Diagnosis not present

## 2014-08-17 DIAGNOSIS — N186 End stage renal disease: Secondary | ICD-10-CM | POA: Diagnosis not present

## 2014-08-17 DIAGNOSIS — Z992 Dependence on renal dialysis: Secondary | ICD-10-CM | POA: Diagnosis not present

## 2014-08-17 DIAGNOSIS — I120 Hypertensive chronic kidney disease with stage 5 chronic kidney disease or end stage renal disease: Secondary | ICD-10-CM | POA: Diagnosis not present

## 2014-08-18 ENCOUNTER — Ambulatory Visit: Payer: Self-pay | Admitting: Neurology

## 2014-08-18 DIAGNOSIS — N2581 Secondary hyperparathyroidism of renal origin: Secondary | ICD-10-CM | POA: Diagnosis not present

## 2014-08-18 DIAGNOSIS — D689 Coagulation defect, unspecified: Secondary | ICD-10-CM | POA: Diagnosis not present

## 2014-08-18 DIAGNOSIS — Z4931 Encounter for adequacy testing for hemodialysis: Secondary | ICD-10-CM | POA: Diagnosis not present

## 2014-08-18 DIAGNOSIS — D631 Anemia in chronic kidney disease: Secondary | ICD-10-CM | POA: Diagnosis not present

## 2014-08-18 DIAGNOSIS — E878 Other disorders of electrolyte and fluid balance, not elsewhere classified: Secondary | ICD-10-CM | POA: Diagnosis not present

## 2014-08-18 DIAGNOSIS — E46 Unspecified protein-calorie malnutrition: Secondary | ICD-10-CM | POA: Diagnosis not present

## 2014-08-18 DIAGNOSIS — R6883 Chills (without fever): Secondary | ICD-10-CM | POA: Diagnosis not present

## 2014-08-18 DIAGNOSIS — N186 End stage renal disease: Secondary | ICD-10-CM | POA: Diagnosis not present

## 2014-08-18 DIAGNOSIS — D509 Iron deficiency anemia, unspecified: Secondary | ICD-10-CM | POA: Diagnosis not present

## 2014-08-18 DIAGNOSIS — Z79899 Other long term (current) drug therapy: Secondary | ICD-10-CM | POA: Diagnosis not present

## 2014-08-19 ENCOUNTER — Encounter: Payer: Self-pay | Admitting: Family Medicine

## 2014-08-19 DIAGNOSIS — R6883 Chills (without fever): Secondary | ICD-10-CM | POA: Diagnosis not present

## 2014-08-19 DIAGNOSIS — D509 Iron deficiency anemia, unspecified: Secondary | ICD-10-CM | POA: Diagnosis not present

## 2014-08-19 DIAGNOSIS — Z4931 Encounter for adequacy testing for hemodialysis: Secondary | ICD-10-CM | POA: Diagnosis not present

## 2014-08-19 DIAGNOSIS — Z79899 Other long term (current) drug therapy: Secondary | ICD-10-CM | POA: Diagnosis not present

## 2014-08-19 DIAGNOSIS — D689 Coagulation defect, unspecified: Secondary | ICD-10-CM | POA: Diagnosis not present

## 2014-08-19 DIAGNOSIS — N186 End stage renal disease: Secondary | ICD-10-CM | POA: Diagnosis not present

## 2014-08-20 DIAGNOSIS — D689 Coagulation defect, unspecified: Secondary | ICD-10-CM | POA: Diagnosis not present

## 2014-08-20 DIAGNOSIS — R6883 Chills (without fever): Secondary | ICD-10-CM | POA: Diagnosis not present

## 2014-08-20 DIAGNOSIS — Z79899 Other long term (current) drug therapy: Secondary | ICD-10-CM | POA: Diagnosis not present

## 2014-08-20 DIAGNOSIS — Z4931 Encounter for adequacy testing for hemodialysis: Secondary | ICD-10-CM | POA: Diagnosis not present

## 2014-08-20 DIAGNOSIS — N186 End stage renal disease: Secondary | ICD-10-CM | POA: Diagnosis not present

## 2014-08-20 DIAGNOSIS — D509 Iron deficiency anemia, unspecified: Secondary | ICD-10-CM | POA: Diagnosis not present

## 2014-08-21 DIAGNOSIS — D509 Iron deficiency anemia, unspecified: Secondary | ICD-10-CM | POA: Diagnosis not present

## 2014-08-21 DIAGNOSIS — R6883 Chills (without fever): Secondary | ICD-10-CM | POA: Diagnosis not present

## 2014-08-21 DIAGNOSIS — Z4931 Encounter for adequacy testing for hemodialysis: Secondary | ICD-10-CM | POA: Diagnosis not present

## 2014-08-21 DIAGNOSIS — D689 Coagulation defect, unspecified: Secondary | ICD-10-CM | POA: Diagnosis not present

## 2014-08-21 DIAGNOSIS — Z79899 Other long term (current) drug therapy: Secondary | ICD-10-CM | POA: Diagnosis not present

## 2014-08-21 DIAGNOSIS — N186 End stage renal disease: Secondary | ICD-10-CM | POA: Diagnosis not present

## 2014-08-22 DIAGNOSIS — R6883 Chills (without fever): Secondary | ICD-10-CM | POA: Diagnosis not present

## 2014-08-22 DIAGNOSIS — D509 Iron deficiency anemia, unspecified: Secondary | ICD-10-CM | POA: Diagnosis not present

## 2014-08-22 DIAGNOSIS — D689 Coagulation defect, unspecified: Secondary | ICD-10-CM | POA: Diagnosis not present

## 2014-08-22 DIAGNOSIS — N186 End stage renal disease: Secondary | ICD-10-CM | POA: Diagnosis not present

## 2014-08-22 DIAGNOSIS — Z79899 Other long term (current) drug therapy: Secondary | ICD-10-CM | POA: Diagnosis not present

## 2014-08-22 DIAGNOSIS — Z4931 Encounter for adequacy testing for hemodialysis: Secondary | ICD-10-CM | POA: Diagnosis not present

## 2014-08-25 DIAGNOSIS — D509 Iron deficiency anemia, unspecified: Secondary | ICD-10-CM | POA: Diagnosis not present

## 2014-08-25 DIAGNOSIS — Z4931 Encounter for adequacy testing for hemodialysis: Secondary | ICD-10-CM | POA: Diagnosis not present

## 2014-08-25 DIAGNOSIS — N186 End stage renal disease: Secondary | ICD-10-CM | POA: Diagnosis not present

## 2014-08-25 DIAGNOSIS — R6883 Chills (without fever): Secondary | ICD-10-CM | POA: Diagnosis not present

## 2014-08-25 DIAGNOSIS — D689 Coagulation defect, unspecified: Secondary | ICD-10-CM | POA: Diagnosis not present

## 2014-08-25 DIAGNOSIS — Z79899 Other long term (current) drug therapy: Secondary | ICD-10-CM | POA: Diagnosis not present

## 2014-08-26 DIAGNOSIS — R6883 Chills (without fever): Secondary | ICD-10-CM | POA: Diagnosis not present

## 2014-08-26 DIAGNOSIS — Z79899 Other long term (current) drug therapy: Secondary | ICD-10-CM | POA: Diagnosis not present

## 2014-08-26 DIAGNOSIS — Z4931 Encounter for adequacy testing for hemodialysis: Secondary | ICD-10-CM | POA: Diagnosis not present

## 2014-08-26 DIAGNOSIS — N186 End stage renal disease: Secondary | ICD-10-CM | POA: Diagnosis not present

## 2014-08-26 DIAGNOSIS — D689 Coagulation defect, unspecified: Secondary | ICD-10-CM | POA: Diagnosis not present

## 2014-08-26 DIAGNOSIS — D509 Iron deficiency anemia, unspecified: Secondary | ICD-10-CM | POA: Diagnosis not present

## 2014-08-27 DIAGNOSIS — N186 End stage renal disease: Secondary | ICD-10-CM | POA: Diagnosis not present

## 2014-08-27 DIAGNOSIS — R6883 Chills (without fever): Secondary | ICD-10-CM | POA: Diagnosis not present

## 2014-08-27 DIAGNOSIS — D509 Iron deficiency anemia, unspecified: Secondary | ICD-10-CM | POA: Diagnosis not present

## 2014-08-27 DIAGNOSIS — D689 Coagulation defect, unspecified: Secondary | ICD-10-CM | POA: Diagnosis not present

## 2014-08-27 DIAGNOSIS — Z79899 Other long term (current) drug therapy: Secondary | ICD-10-CM | POA: Diagnosis not present

## 2014-08-27 DIAGNOSIS — Z4931 Encounter for adequacy testing for hemodialysis: Secondary | ICD-10-CM | POA: Diagnosis not present

## 2014-08-29 DIAGNOSIS — D689 Coagulation defect, unspecified: Secondary | ICD-10-CM | POA: Diagnosis not present

## 2014-08-29 DIAGNOSIS — N186 End stage renal disease: Secondary | ICD-10-CM | POA: Diagnosis not present

## 2014-08-29 DIAGNOSIS — R6883 Chills (without fever): Secondary | ICD-10-CM | POA: Diagnosis not present

## 2014-08-29 DIAGNOSIS — D509 Iron deficiency anemia, unspecified: Secondary | ICD-10-CM | POA: Diagnosis not present

## 2014-08-29 DIAGNOSIS — Z79899 Other long term (current) drug therapy: Secondary | ICD-10-CM | POA: Diagnosis not present

## 2014-08-29 DIAGNOSIS — Z4931 Encounter for adequacy testing for hemodialysis: Secondary | ICD-10-CM | POA: Diagnosis not present

## 2014-08-30 DIAGNOSIS — Z4931 Encounter for adequacy testing for hemodialysis: Secondary | ICD-10-CM | POA: Diagnosis not present

## 2014-08-30 DIAGNOSIS — D689 Coagulation defect, unspecified: Secondary | ICD-10-CM | POA: Diagnosis not present

## 2014-08-30 DIAGNOSIS — R6883 Chills (without fever): Secondary | ICD-10-CM | POA: Diagnosis not present

## 2014-08-30 DIAGNOSIS — Z79899 Other long term (current) drug therapy: Secondary | ICD-10-CM | POA: Diagnosis not present

## 2014-08-30 DIAGNOSIS — D509 Iron deficiency anemia, unspecified: Secondary | ICD-10-CM | POA: Diagnosis not present

## 2014-08-30 DIAGNOSIS — N186 End stage renal disease: Secondary | ICD-10-CM | POA: Diagnosis not present

## 2014-09-01 ENCOUNTER — Ambulatory Visit (INDEPENDENT_AMBULATORY_CARE_PROVIDER_SITE_OTHER): Payer: Medicare Other | Admitting: Neurology

## 2014-09-01 ENCOUNTER — Encounter: Payer: Self-pay | Admitting: Neurology

## 2014-09-01 VITALS — BP 144/66 | HR 72 | Ht 67.0 in | Wt 252.0 lb

## 2014-09-01 DIAGNOSIS — N186 End stage renal disease: Secondary | ICD-10-CM | POA: Diagnosis not present

## 2014-09-01 DIAGNOSIS — R6883 Chills (without fever): Secondary | ICD-10-CM | POA: Diagnosis not present

## 2014-09-01 DIAGNOSIS — Z992 Dependence on renal dialysis: Secondary | ICD-10-CM

## 2014-09-01 DIAGNOSIS — Z4931 Encounter for adequacy testing for hemodialysis: Secondary | ICD-10-CM | POA: Diagnosis not present

## 2014-09-01 DIAGNOSIS — D638 Anemia in other chronic diseases classified elsewhere: Secondary | ICD-10-CM

## 2014-09-01 DIAGNOSIS — D509 Iron deficiency anemia, unspecified: Secondary | ICD-10-CM | POA: Diagnosis not present

## 2014-09-01 DIAGNOSIS — R569 Unspecified convulsions: Secondary | ICD-10-CM | POA: Diagnosis not present

## 2014-09-01 DIAGNOSIS — D689 Coagulation defect, unspecified: Secondary | ICD-10-CM | POA: Diagnosis not present

## 2014-09-01 DIAGNOSIS — Z79899 Other long term (current) drug therapy: Secondary | ICD-10-CM | POA: Diagnosis not present

## 2014-09-01 MED ORDER — LEVETIRACETAM ER 500 MG PO TB24: 500.0000 mg | ORAL_TABLET | Freq: Every day | ORAL | Status: DC

## 2014-09-01 NOTE — Progress Notes (Signed)
PATIENT: Perry [name] DOB: 08/18/1970  Chief Complaint  Patient presents with  . Seizures    He is here with his wife, Perry [name].  He has been having frequent seizure-like events since May 2016.  They estimate between 15-20 events.  He was admitted to the hospital and underwent an abnormal EEG.  He was placed on Depakote while there but experienced hallucinations.  He was not discharged on any anticonvulsant and instructed to follow up here.  He is also having problems with photosensitivity.     HISTORICAL  Perry [name] is a 44 years old right-handed male, accompanied by his wife Perry [name], seen in refer by his primary care physician Dr. Lynnea Ferrier. He had hospital admission in July 07 2014 for seizure  I saw him once in September 2014 for seizure, he had a history of end-stage renal disease, on dialysis since 2011, currently receiving home dialysis 5 days a week, also had a history of hypertension, obesity, obstructive sleep apnea, gastric bypass surgery, he presented with one seizure while he missed his dialysis, it was thought due to metabolic disarrangement, EEG, and MRI brain was normal then, he was not treated with any antiepileptic medications  He noted to have frequent hands tremor, body myoclonic movement, unsteady gait, frequent falling since May 2016, in June 2016, he developed staring spells, confusion, unresponsiveness, was taken to the hospital, I have personally reviewed MRI of the brain was normal, EEG showed occasionally generalized epileptiform discharges, some of that associated with body jerking movement, some background slowing,  He was treated with Depakote for few days, developed visual hallucinations, which has much improved after Depakote was stopped, now his not on any antiepileptic medications, has occasionally bilateral hands tremor, body jerking movement, but no loss of consciousness, different from his seizure activity.  He also complains of excessive jaw  soreness, light sensitivity,  REVIEW OF SYSTEMS: Full 14 system review of systems performed and notable only for joint pain, apnea, snoring, light sensitivity, dizziness, numbness, weakness, tremor  ALLERGIES: Allergies  Allergen Reactions  . Depakote [Divalproex Sodium] Other (See Comments)    Hallucinations     HOME MEDICATIONS: Current Outpatient Prescriptions  Medication Sig Dispense Refill  . acetaminophen (TYLENOL) 325 MG tablet Take 650 mg by mouth every 6 (six) hours as needed for mild pain or moderate pain.    [name] alprazolam (XANAX) 2 MG tablet TAKE ONE TABLET BY MOUTH ONCE DAILY AT BEDTIME AS NEEDED  30 tablet 0  . apixaban (ELIQUIS) 2.5 MG TABS tablet Take 2.5 mg by mouth 2 (two) times daily.    [name] aspirin 81 MG tablet Take 81 mg by mouth daily.    . calcitRIOL (ROCALTROL) 0.25 MCG capsule Take 0.75 mcg by mouth 3 (three) times daily with meals.     . cinacalcet (SENSIPAR) 60 MG tablet Take 120 mg by mouth every evening.     . escitalopram (LEXAPRO) 10 MG tablet Take 1 tablet (10 mg total) by mouth daily. 30 tablet 3  . HYDROcodone-acetaminophen (NORCO) 5-325 MG per tablet Take 1 tablet by mouth every 6 (six) hours as needed for moderate pain. 30 tablet 0  . LORazepam (ATIVAN) 0.5 MG tablet Take 1 tablet (0.5 mg total) by mouth every 8 (eight) hours. 10 tablet 0  . multivitamin (RENA-VIT) TABS tablet Take 1 tablet by mouth at bedtime.     . Oxycodone HCl 10 MG TABS Take 10 mg by mouth every 4 (four) hours as needed (for pain).     [name]  pantoprazole (PROTONIX) 20 MG tablet Take 1 tablet (20 mg total) by mouth daily. 30 tablet 1  . sevelamer (RENVELA) 800 MG tablet Take 3,200 mg by mouth 3 (three) times daily with meals.     [name] UNABLE TO FIND Dispense one Standard Wheelchair to fit patient. 1 each 0  . vancomycin (VANCOCIN) 50 mg/mL oral solution Take 2.5 mLs (125 mg total) by mouth 4 (four) times daily. 150 mL 0   No current facility-administered medications for this visit.     PAST MEDICAL HISTORY: Past Medical History  Diagnosis Date  . Dialysis patient   . Sleep apnea     cpap-   . Anxiety   . [name] failure     on dialysis since 2011- [facility]- nephrologist   . GERD (gastroesophageal reflux disease)   . High blood pressure     hx of   . Acute meniscal tear of left knee   . Acute meniscal tear of right knee   . Chronic back pain     due to weight   . Hemodialysis patient     since 2011, does hemodialysis at home, wife does she is a Best boy , Daily except wed and sun  . Seizures     PAST SURGICAL HISTORY: Past Surgical History  Procedure Laterality Date  . Av fistula repair    . Av fistula placement      left arm -   . Stents in left lower forearm       due to clotting in Av fistula   . Av fistula right wrist     . Av fistula right upper arm     . Thrombectomies to left lower foreram fistula     . 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 Laparoscopic gastric sleeve resection N/A 05/05/2014    Procedure: LAPAROSCOPIC GASTRIC SLEEVE RESECTION;  Surgeon: Luretha Murphy, MD;  Location: [facility];  Service: General;  Laterality: N/A;  . Peripheral vascular catheterization N/A 06/02/2014    Procedure: A/V Shuntogram/Fistulagram;  Surgeon: Annice Needy, MD;  Location: [facility] INVASIVE CV [facility];  Service: Cardiovascular;  Laterality: N/A;  . Peripheral vascular catheterization N/A 06/02/2014    Procedure: A/V Shunt Intervention;  Surgeon: Annice Needy, MD;  Location: [facility] INV[facility] CV LAB;  Service: Cardiovascular;  Laterality: N/A;  . Peripheral vascular catheterization N/A 06/02/2014    Procedure: Dialysis/Perma Catheter Insertion;  Surgeon: Annice Needy, MD;  Location: [facility] CV [facility];  Service: Cardiovascular;  Laterality: N/A;  . Av fistula placement Left 06/26/2014    Procedure: Left arm AV fistula creation;  Surgeon: Annice Needy, MD;  Location: [facility] ORS;  Service: Vascular;  Laterality: Left;    FAMILY HISTORY: Family History  Problem  Relation Age of Onset  . High blood pressure Mother     SOCIAL HISTORY:  Social History   Social History  . Marital Status: Married    Spouse Name: N/A  . Number of Children: 2  . Years of Education: 13   Occupational History  . Disabled     Disabled   Social History Main Topics  . Smoking status: Current Every Day Smoker -- 0.50 packs/day for 20 years    Types: Cigarettes    Last Attempt to Quit: 06/17/2013  . Smokeless tobacco: Never Used  . Alcohol Use: No  . Drug Use: No  . Sexual Activity: Yes   Other Topics Concern  .  Not on file   Social History Narrative   Patient lives home at home with his wife Hydrographic surveyor ). Patient is disabled.    Caffeine-one cup of coffee daily.   Right handed.     PHYSICAL EXAM   Filed Vitals:   09/01/14 1606  BP: 144/66  Pulse: 72  Height: 5\' 7"  (1.702 m)  Weight: 252 lb (114.306 kg)    Not recorded      Body mass index is 39.46 kg/(m^2).  PHYSICAL EXAMNIATION:  Gen: NAD, conversant, well nourised, obese, well groomed                     Cardiovascular: Regular rate rhythm, no peripheral edema, warm, nontender. Eyes: Conjunctivae clear without exudates or hemorrhage Neck: Supple, no carotid bruise. Pulmonary: Clear to auscultation bilaterally   NEUROLOGICAL EXAM:  MENTAL STATUS: Speech:    Speech is normal; fluent and spontaneous with normal comprehension.  Cognition: Tired looking middle-age male, wearing sunglasses at home because of light sensitivity     Orientation to time, place and person     Normal recent and remote memory     Normal Attention span and concentration     Normal Language, naming, repeating,spontaneous speech     Fund of knowledge   CRANIAL NERVES: CN II: Visual fields are full to confrontation.  Pupils are round equal and briskly reactive to light. CN III, IV, VI: extraocular movement are normal. No ptosis. CN V: Facial sensation is intact to pinprick in all 3 divisions bilaterally. Corneal  responses are intact.  CN VII: Face is symmetric with normal eye closure and smile. CN VIII: Hearing is normal to rubbing fingers CN IX, X: Palate elevates symmetrically. Phonation is normal. CN XI: Head turning and shoulder shrug are intact CN XII: Tongue is midline with normal movements and no atrophy.  MOTOR: There is no pronator drift of out-stretched arms. Muscle bulk and tone are normal. Muscle strength is normal.  REFLEXES: Reflexes are 2+ and symmetric at the biceps, triceps, knees, and ankles. Plantar responses are flexor.  SENSORY: Intact to light touch, pinprick, position sense, and vibration sense are intact in fingers and toes.  COORDINATION: Rapid alternating movements and fine finger movements are intact. There is no dysmetria on finger-to-nose and heel-knee-shin.    GAIT/STANCE: Posture is normal. Gait is steady with normal steps, base, arm swing, and turning. Heel and toe walking are normal. Tandem gait is normal.  Romberg is absent.   DIAGNOSTIC DATA (LABS, IMAGING, TESTING) - I reviewed patient records, labs, notes, testing and imaging myself where available.   ASSESSMENT AND PLAN  Perry [name] is a 44 y.o. male    Seizure, abnormal EEG  Will start Keppra XR 500 mg every night  Repeat EEG Myoclonic movement End-stage renal disease, hemodialysis dependent  Perry [name], M.D. Ph.D.  [facility] [address], [address] [place], [place] [address] Ph: [phone] Fax: [phone]  CC: Dr. Lynnea Ferrier

## 2014-09-02 DIAGNOSIS — D509 Iron deficiency anemia, unspecified: Secondary | ICD-10-CM | POA: Diagnosis not present

## 2014-09-02 DIAGNOSIS — N186 End stage renal disease: Secondary | ICD-10-CM | POA: Diagnosis not present

## 2014-09-02 DIAGNOSIS — Z79899 Other long term (current) drug therapy: Secondary | ICD-10-CM | POA: Diagnosis not present

## 2014-09-02 DIAGNOSIS — Z4931 Encounter for adequacy testing for hemodialysis: Secondary | ICD-10-CM | POA: Diagnosis not present

## 2014-09-02 DIAGNOSIS — R6883 Chills (without fever): Secondary | ICD-10-CM | POA: Diagnosis not present

## 2014-09-02 DIAGNOSIS — D689 Coagulation defect, unspecified: Secondary | ICD-10-CM | POA: Diagnosis not present

## 2014-09-04 DIAGNOSIS — R6883 Chills (without fever): Secondary | ICD-10-CM | POA: Diagnosis not present

## 2014-09-04 DIAGNOSIS — D509 Iron deficiency anemia, unspecified: Secondary | ICD-10-CM | POA: Diagnosis not present

## 2014-09-04 DIAGNOSIS — D689 Coagulation defect, unspecified: Secondary | ICD-10-CM | POA: Diagnosis not present

## 2014-09-04 DIAGNOSIS — Z79899 Other long term (current) drug therapy: Secondary | ICD-10-CM | POA: Diagnosis not present

## 2014-09-04 DIAGNOSIS — N186 End stage renal disease: Secondary | ICD-10-CM | POA: Diagnosis not present

## 2014-09-04 DIAGNOSIS — Z4931 Encounter for adequacy testing for hemodialysis: Secondary | ICD-10-CM | POA: Diagnosis not present

## 2014-09-05 DIAGNOSIS — Z4931 Encounter for adequacy testing for hemodialysis: Secondary | ICD-10-CM | POA: Diagnosis not present

## 2014-09-05 DIAGNOSIS — D509 Iron deficiency anemia, unspecified: Secondary | ICD-10-CM | POA: Diagnosis not present

## 2014-09-05 DIAGNOSIS — Z79899 Other long term (current) drug therapy: Secondary | ICD-10-CM | POA: Diagnosis not present

## 2014-09-05 DIAGNOSIS — N186 End stage renal disease: Secondary | ICD-10-CM | POA: Diagnosis not present

## 2014-09-05 DIAGNOSIS — R6883 Chills (without fever): Secondary | ICD-10-CM | POA: Diagnosis not present

## 2014-09-05 DIAGNOSIS — D689 Coagulation defect, unspecified: Secondary | ICD-10-CM | POA: Diagnosis not present

## 2014-09-06 DIAGNOSIS — N186 End stage renal disease: Secondary | ICD-10-CM | POA: Diagnosis not present

## 2014-09-06 DIAGNOSIS — Z79899 Other long term (current) drug therapy: Secondary | ICD-10-CM | POA: Diagnosis not present

## 2014-09-06 DIAGNOSIS — Z4931 Encounter for adequacy testing for hemodialysis: Secondary | ICD-10-CM | POA: Diagnosis not present

## 2014-09-06 DIAGNOSIS — R6883 Chills (without fever): Secondary | ICD-10-CM | POA: Diagnosis not present

## 2014-09-06 DIAGNOSIS — D509 Iron deficiency anemia, unspecified: Secondary | ICD-10-CM | POA: Diagnosis not present

## 2014-09-06 DIAGNOSIS — D689 Coagulation defect, unspecified: Secondary | ICD-10-CM | POA: Diagnosis not present

## 2014-09-08 DIAGNOSIS — Z4931 Encounter for adequacy testing for hemodialysis: Secondary | ICD-10-CM | POA: Diagnosis not present

## 2014-09-08 DIAGNOSIS — R6883 Chills (without fever): Secondary | ICD-10-CM | POA: Diagnosis not present

## 2014-09-08 DIAGNOSIS — N186 End stage renal disease: Secondary | ICD-10-CM | POA: Diagnosis not present

## 2014-09-08 DIAGNOSIS — D689 Coagulation defect, unspecified: Secondary | ICD-10-CM | POA: Diagnosis not present

## 2014-09-08 DIAGNOSIS — Z79899 Other long term (current) drug therapy: Secondary | ICD-10-CM | POA: Diagnosis not present

## 2014-09-08 DIAGNOSIS — D509 Iron deficiency anemia, unspecified: Secondary | ICD-10-CM | POA: Diagnosis not present

## 2014-09-09 DIAGNOSIS — M47817 Spondylosis without myelopathy or radiculopathy, lumbosacral region: Secondary | ICD-10-CM | POA: Diagnosis not present

## 2014-09-09 DIAGNOSIS — T82898A Other specified complication of vascular prosthetic devices, implants and grafts, initial encounter: Secondary | ICD-10-CM | POA: Diagnosis not present

## 2014-09-09 DIAGNOSIS — N186 End stage renal disease: Secondary | ICD-10-CM | POA: Diagnosis not present

## 2014-09-09 DIAGNOSIS — M545 Low back pain: Secondary | ICD-10-CM | POA: Diagnosis not present

## 2014-09-09 DIAGNOSIS — G588 Other specified mononeuropathies: Secondary | ICD-10-CM | POA: Diagnosis not present

## 2014-09-10 ENCOUNTER — Encounter: Payer: Self-pay | Admitting: Family Medicine

## 2014-09-10 ENCOUNTER — Other Ambulatory Visit: Payer: Self-pay | Admitting: Family Medicine

## 2014-09-10 DIAGNOSIS — D509 Iron deficiency anemia, unspecified: Secondary | ICD-10-CM | POA: Diagnosis not present

## 2014-09-10 DIAGNOSIS — N186 End stage renal disease: Secondary | ICD-10-CM | POA: Diagnosis not present

## 2014-09-10 DIAGNOSIS — R6883 Chills (without fever): Secondary | ICD-10-CM | POA: Diagnosis not present

## 2014-09-10 DIAGNOSIS — D689 Coagulation defect, unspecified: Secondary | ICD-10-CM | POA: Diagnosis not present

## 2014-09-10 DIAGNOSIS — Z79899 Other long term (current) drug therapy: Secondary | ICD-10-CM | POA: Diagnosis not present

## 2014-09-10 DIAGNOSIS — Z4931 Encounter for adequacy testing for hemodialysis: Secondary | ICD-10-CM | POA: Diagnosis not present

## 2014-09-10 NOTE — Telephone Encounter (Signed)
?   OK to Refill  

## 2014-09-11 ENCOUNTER — Other Ambulatory Visit: Payer: Self-pay | Admitting: Family Medicine

## 2014-09-11 DIAGNOSIS — D509 Iron deficiency anemia, unspecified: Secondary | ICD-10-CM | POA: Diagnosis not present

## 2014-09-11 DIAGNOSIS — Z4931 Encounter for adequacy testing for hemodialysis: Secondary | ICD-10-CM | POA: Diagnosis not present

## 2014-09-11 DIAGNOSIS — N186 End stage renal disease: Secondary | ICD-10-CM | POA: Diagnosis not present

## 2014-09-11 DIAGNOSIS — D689 Coagulation defect, unspecified: Secondary | ICD-10-CM | POA: Diagnosis not present

## 2014-09-11 DIAGNOSIS — R6883 Chills (without fever): Secondary | ICD-10-CM | POA: Diagnosis not present

## 2014-09-11 DIAGNOSIS — Z79899 Other long term (current) drug therapy: Secondary | ICD-10-CM | POA: Diagnosis not present

## 2014-09-11 MED ORDER — ALPRAZOLAM 2 MG PO TABS: ORAL_TABLET | ORAL | Status: DC

## 2014-09-11 NOTE — Telephone Encounter (Signed)
Medication called/sent to requested pharmacy and pt aware via mychart 

## 2014-09-13 DIAGNOSIS — N186 End stage renal disease: Secondary | ICD-10-CM | POA: Diagnosis not present

## 2014-09-13 DIAGNOSIS — Z79899 Other long term (current) drug therapy: Secondary | ICD-10-CM | POA: Diagnosis not present

## 2014-09-13 DIAGNOSIS — D689 Coagulation defect, unspecified: Secondary | ICD-10-CM | POA: Diagnosis not present

## 2014-09-13 DIAGNOSIS — D509 Iron deficiency anemia, unspecified: Secondary | ICD-10-CM | POA: Diagnosis not present

## 2014-09-13 DIAGNOSIS — Z4931 Encounter for adequacy testing for hemodialysis: Secondary | ICD-10-CM | POA: Diagnosis not present

## 2014-09-13 DIAGNOSIS — R6883 Chills (without fever): Secondary | ICD-10-CM | POA: Diagnosis not present

## 2014-09-15 DIAGNOSIS — D509 Iron deficiency anemia, unspecified: Secondary | ICD-10-CM | POA: Diagnosis not present

## 2014-09-15 DIAGNOSIS — N186 End stage renal disease: Secondary | ICD-10-CM | POA: Diagnosis not present

## 2014-09-15 DIAGNOSIS — D689 Coagulation defect, unspecified: Secondary | ICD-10-CM | POA: Diagnosis not present

## 2014-09-15 DIAGNOSIS — Z4931 Encounter for adequacy testing for hemodialysis: Secondary | ICD-10-CM | POA: Diagnosis not present

## 2014-09-15 DIAGNOSIS — Z79899 Other long term (current) drug therapy: Secondary | ICD-10-CM | POA: Diagnosis not present

## 2014-09-15 DIAGNOSIS — R6883 Chills (without fever): Secondary | ICD-10-CM | POA: Diagnosis not present

## 2014-09-16 DIAGNOSIS — R6883 Chills (without fever): Secondary | ICD-10-CM | POA: Diagnosis not present

## 2014-09-16 DIAGNOSIS — N186 End stage renal disease: Secondary | ICD-10-CM | POA: Diagnosis not present

## 2014-09-16 DIAGNOSIS — Z992 Dependence on renal dialysis: Secondary | ICD-10-CM | POA: Diagnosis not present

## 2014-09-16 DIAGNOSIS — Z01818 Encounter for other preprocedural examination: Secondary | ICD-10-CM | POA: Diagnosis not present

## 2014-09-16 DIAGNOSIS — N2889 Other specified disorders of kidney and ureter: Secondary | ICD-10-CM | POA: Diagnosis not present

## 2014-09-16 DIAGNOSIS — D509 Iron deficiency anemia, unspecified: Secondary | ICD-10-CM | POA: Diagnosis not present

## 2014-09-16 DIAGNOSIS — I151 Hypertension secondary to other renal disorders: Secondary | ICD-10-CM | POA: Diagnosis not present

## 2014-09-16 DIAGNOSIS — Z4931 Encounter for adequacy testing for hemodialysis: Secondary | ICD-10-CM | POA: Diagnosis not present

## 2014-09-16 DIAGNOSIS — Z01810 Encounter for preprocedural cardiovascular examination: Secondary | ICD-10-CM | POA: Diagnosis not present

## 2014-09-16 DIAGNOSIS — Z79899 Other long term (current) drug therapy: Secondary | ICD-10-CM | POA: Diagnosis not present

## 2014-09-16 DIAGNOSIS — I120 Hypertensive chronic kidney disease with stage 5 chronic kidney disease or end stage renal disease: Secondary | ICD-10-CM | POA: Diagnosis not present

## 2014-09-16 DIAGNOSIS — I708 Atherosclerosis of other arteries: Secondary | ICD-10-CM | POA: Diagnosis not present

## 2014-09-16 DIAGNOSIS — I6521 Occlusion and stenosis of right carotid artery: Secondary | ICD-10-CM | POA: Diagnosis not present

## 2014-09-16 DIAGNOSIS — D689 Coagulation defect, unspecified: Secondary | ICD-10-CM | POA: Diagnosis not present

## 2014-09-17 DIAGNOSIS — Z79899 Other long term (current) drug therapy: Secondary | ICD-10-CM | POA: Diagnosis not present

## 2014-09-17 DIAGNOSIS — N186 End stage renal disease: Secondary | ICD-10-CM | POA: Diagnosis not present

## 2014-09-17 DIAGNOSIS — D509 Iron deficiency anemia, unspecified: Secondary | ICD-10-CM | POA: Diagnosis not present

## 2014-09-17 DIAGNOSIS — Z4931 Encounter for adequacy testing for hemodialysis: Secondary | ICD-10-CM | POA: Diagnosis not present

## 2014-09-17 DIAGNOSIS — I120 Hypertensive chronic kidney disease with stage 5 chronic kidney disease or end stage renal disease: Secondary | ICD-10-CM | POA: Diagnosis not present

## 2014-09-17 DIAGNOSIS — D689 Coagulation defect, unspecified: Secondary | ICD-10-CM | POA: Diagnosis not present

## 2014-09-17 DIAGNOSIS — Z992 Dependence on renal dialysis: Secondary | ICD-10-CM | POA: Diagnosis not present

## 2014-09-17 DIAGNOSIS — R6883 Chills (without fever): Secondary | ICD-10-CM | POA: Diagnosis not present

## 2014-09-18 DIAGNOSIS — Z4931 Encounter for adequacy testing for hemodialysis: Secondary | ICD-10-CM | POA: Diagnosis not present

## 2014-09-18 DIAGNOSIS — E878 Other disorders of electrolyte and fluid balance, not elsewhere classified: Secondary | ICD-10-CM | POA: Diagnosis not present

## 2014-09-18 DIAGNOSIS — N186 End stage renal disease: Secondary | ICD-10-CM | POA: Diagnosis not present

## 2014-09-18 DIAGNOSIS — D509 Iron deficiency anemia, unspecified: Secondary | ICD-10-CM | POA: Diagnosis not present

## 2014-09-18 DIAGNOSIS — E46 Unspecified protein-calorie malnutrition: Secondary | ICD-10-CM | POA: Diagnosis not present

## 2014-09-18 DIAGNOSIS — Z79899 Other long term (current) drug therapy: Secondary | ICD-10-CM | POA: Diagnosis not present

## 2014-09-18 DIAGNOSIS — D631 Anemia in chronic kidney disease: Secondary | ICD-10-CM | POA: Diagnosis not present

## 2014-09-18 DIAGNOSIS — N2581 Secondary hyperparathyroidism of renal origin: Secondary | ICD-10-CM | POA: Diagnosis not present

## 2014-10-03 ENCOUNTER — Other Ambulatory Visit: Payer: Self-pay | Admitting: Internal Medicine

## 2014-10-09 DIAGNOSIS — T82898A Other specified complication of vascular prosthetic devices, implants and grafts, initial encounter: Secondary | ICD-10-CM | POA: Diagnosis not present

## 2014-10-09 DIAGNOSIS — N186 End stage renal disease: Secondary | ICD-10-CM | POA: Diagnosis not present

## 2014-10-09 DIAGNOSIS — M47817 Spondylosis without myelopathy or radiculopathy, lumbosacral region: Secondary | ICD-10-CM | POA: Diagnosis not present

## 2014-10-09 DIAGNOSIS — M545 Low back pain: Secondary | ICD-10-CM | POA: Diagnosis not present

## 2014-10-16 ENCOUNTER — Other Ambulatory Visit: Payer: Self-pay | Admitting: Internal Medicine

## 2014-10-17 DIAGNOSIS — Z992 Dependence on renal dialysis: Secondary | ICD-10-CM | POA: Diagnosis not present

## 2014-10-17 DIAGNOSIS — I120 Hypertensive chronic kidney disease with stage 5 chronic kidney disease or end stage renal disease: Secondary | ICD-10-CM | POA: Diagnosis not present

## 2014-10-17 DIAGNOSIS — N186 End stage renal disease: Secondary | ICD-10-CM | POA: Diagnosis not present

## 2014-10-18 DIAGNOSIS — D509 Iron deficiency anemia, unspecified: Secondary | ICD-10-CM | POA: Diagnosis not present

## 2014-10-18 DIAGNOSIS — N2589 Other disorders resulting from impaired renal tubular function: Secondary | ICD-10-CM | POA: Diagnosis not present

## 2014-10-18 DIAGNOSIS — D631 Anemia in chronic kidney disease: Secondary | ICD-10-CM | POA: Diagnosis not present

## 2014-10-18 DIAGNOSIS — N186 End stage renal disease: Secondary | ICD-10-CM | POA: Diagnosis not present

## 2014-10-18 DIAGNOSIS — Z4931 Encounter for adequacy testing for hemodialysis: Secondary | ICD-10-CM | POA: Diagnosis not present

## 2014-10-18 DIAGNOSIS — N2581 Secondary hyperparathyroidism of renal origin: Secondary | ICD-10-CM | POA: Diagnosis not present

## 2014-10-19 DIAGNOSIS — N2581 Secondary hyperparathyroidism of renal origin: Secondary | ICD-10-CM | POA: Diagnosis not present

## 2014-10-19 DIAGNOSIS — N2589 Other disorders resulting from impaired renal tubular function: Secondary | ICD-10-CM | POA: Diagnosis not present

## 2014-10-19 DIAGNOSIS — N186 End stage renal disease: Secondary | ICD-10-CM | POA: Diagnosis not present

## 2014-10-19 DIAGNOSIS — D509 Iron deficiency anemia, unspecified: Secondary | ICD-10-CM | POA: Diagnosis not present

## 2014-10-19 DIAGNOSIS — Z4931 Encounter for adequacy testing for hemodialysis: Secondary | ICD-10-CM | POA: Diagnosis not present

## 2014-10-19 DIAGNOSIS — D631 Anemia in chronic kidney disease: Secondary | ICD-10-CM | POA: Diagnosis not present

## 2014-10-20 DIAGNOSIS — N2589 Other disorders resulting from impaired renal tubular function: Secondary | ICD-10-CM | POA: Diagnosis not present

## 2014-10-20 DIAGNOSIS — D509 Iron deficiency anemia, unspecified: Secondary | ICD-10-CM | POA: Diagnosis not present

## 2014-10-20 DIAGNOSIS — N186 End stage renal disease: Secondary | ICD-10-CM | POA: Diagnosis not present

## 2014-10-20 DIAGNOSIS — N2581 Secondary hyperparathyroidism of renal origin: Secondary | ICD-10-CM | POA: Diagnosis not present

## 2014-10-20 DIAGNOSIS — D631 Anemia in chronic kidney disease: Secondary | ICD-10-CM | POA: Diagnosis not present

## 2014-10-20 DIAGNOSIS — Z4931 Encounter for adequacy testing for hemodialysis: Secondary | ICD-10-CM | POA: Diagnosis not present

## 2014-10-23 DIAGNOSIS — D631 Anemia in chronic kidney disease: Secondary | ICD-10-CM | POA: Diagnosis not present

## 2014-10-23 DIAGNOSIS — N2589 Other disorders resulting from impaired renal tubular function: Secondary | ICD-10-CM | POA: Diagnosis not present

## 2014-10-23 DIAGNOSIS — Z4931 Encounter for adequacy testing for hemodialysis: Secondary | ICD-10-CM | POA: Diagnosis not present

## 2014-10-23 DIAGNOSIS — N186 End stage renal disease: Secondary | ICD-10-CM | POA: Diagnosis not present

## 2014-10-23 DIAGNOSIS — D509 Iron deficiency anemia, unspecified: Secondary | ICD-10-CM | POA: Diagnosis not present

## 2014-10-23 DIAGNOSIS — N2581 Secondary hyperparathyroidism of renal origin: Secondary | ICD-10-CM | POA: Diagnosis not present

## 2014-10-23 DIAGNOSIS — E785 Hyperlipidemia, unspecified: Secondary | ICD-10-CM | POA: Diagnosis not present

## 2014-10-24 DIAGNOSIS — D631 Anemia in chronic kidney disease: Secondary | ICD-10-CM | POA: Diagnosis not present

## 2014-10-24 DIAGNOSIS — N186 End stage renal disease: Secondary | ICD-10-CM | POA: Diagnosis not present

## 2014-10-24 DIAGNOSIS — Z4931 Encounter for adequacy testing for hemodialysis: Secondary | ICD-10-CM | POA: Diagnosis not present

## 2014-10-24 DIAGNOSIS — N2581 Secondary hyperparathyroidism of renal origin: Secondary | ICD-10-CM | POA: Diagnosis not present

## 2014-10-24 DIAGNOSIS — N2589 Other disorders resulting from impaired renal tubular function: Secondary | ICD-10-CM | POA: Diagnosis not present

## 2014-10-24 DIAGNOSIS — D509 Iron deficiency anemia, unspecified: Secondary | ICD-10-CM | POA: Diagnosis not present

## 2014-10-25 DIAGNOSIS — D631 Anemia in chronic kidney disease: Secondary | ICD-10-CM | POA: Diagnosis not present

## 2014-10-25 DIAGNOSIS — D509 Iron deficiency anemia, unspecified: Secondary | ICD-10-CM | POA: Diagnosis not present

## 2014-10-25 DIAGNOSIS — N186 End stage renal disease: Secondary | ICD-10-CM | POA: Diagnosis not present

## 2014-10-25 DIAGNOSIS — Z4931 Encounter for adequacy testing for hemodialysis: Secondary | ICD-10-CM | POA: Diagnosis not present

## 2014-10-25 DIAGNOSIS — N2581 Secondary hyperparathyroidism of renal origin: Secondary | ICD-10-CM | POA: Diagnosis not present

## 2014-10-25 DIAGNOSIS — N2589 Other disorders resulting from impaired renal tubular function: Secondary | ICD-10-CM | POA: Diagnosis not present

## 2014-10-27 DIAGNOSIS — D509 Iron deficiency anemia, unspecified: Secondary | ICD-10-CM | POA: Diagnosis not present

## 2014-10-27 DIAGNOSIS — N2581 Secondary hyperparathyroidism of renal origin: Secondary | ICD-10-CM | POA: Diagnosis not present

## 2014-10-27 DIAGNOSIS — N2589 Other disorders resulting from impaired renal tubular function: Secondary | ICD-10-CM | POA: Diagnosis not present

## 2014-10-27 DIAGNOSIS — D631 Anemia in chronic kidney disease: Secondary | ICD-10-CM | POA: Diagnosis not present

## 2014-10-27 DIAGNOSIS — N186 End stage renal disease: Secondary | ICD-10-CM | POA: Diagnosis not present

## 2014-10-27 DIAGNOSIS — Z4931 Encounter for adequacy testing for hemodialysis: Secondary | ICD-10-CM | POA: Diagnosis not present

## 2014-10-28 DIAGNOSIS — R569 Unspecified convulsions: Secondary | ICD-10-CM | POA: Diagnosis not present

## 2014-10-28 DIAGNOSIS — R4182 Altered mental status, unspecified: Secondary | ICD-10-CM | POA: Diagnosis not present

## 2014-10-28 DIAGNOSIS — F329 Major depressive disorder, single episode, unspecified: Secondary | ICD-10-CM | POA: Diagnosis not present

## 2014-10-28 DIAGNOSIS — N2589 Other disorders resulting from impaired renal tubular function: Secondary | ICD-10-CM | POA: Diagnosis not present

## 2014-10-28 DIAGNOSIS — Z992 Dependence on renal dialysis: Secondary | ICD-10-CM | POA: Diagnosis not present

## 2014-10-28 DIAGNOSIS — R1110 Vomiting, unspecified: Secondary | ICD-10-CM | POA: Diagnosis not present

## 2014-10-28 DIAGNOSIS — Z4889 Encounter for other specified surgical aftercare: Secondary | ICD-10-CM | POA: Diagnosis not present

## 2014-10-28 DIAGNOSIS — R410 Disorientation, unspecified: Secondary | ICD-10-CM | POA: Diagnosis not present

## 2014-10-28 DIAGNOSIS — Z4931 Encounter for adequacy testing for hemodialysis: Secondary | ICD-10-CM | POA: Diagnosis not present

## 2014-10-28 DIAGNOSIS — Y841 Kidney dialysis as the cause of abnormal reaction of the patient, or of later complication, without mention of misadventure at the time of the procedure: Secondary | ICD-10-CM | POA: Diagnosis not present

## 2014-10-28 DIAGNOSIS — D509 Iron deficiency anemia, unspecified: Secondary | ICD-10-CM | POA: Diagnosis not present

## 2014-10-28 DIAGNOSIS — Z79899 Other long term (current) drug therapy: Secondary | ICD-10-CM | POA: Diagnosis not present

## 2014-10-28 DIAGNOSIS — K219 Gastro-esophageal reflux disease without esophagitis: Secondary | ICD-10-CM | POA: Diagnosis not present

## 2014-10-28 DIAGNOSIS — N186 End stage renal disease: Secondary | ICD-10-CM | POA: Diagnosis not present

## 2014-10-28 DIAGNOSIS — F17200 Nicotine dependence, unspecified, uncomplicated: Secondary | ICD-10-CM | POA: Diagnosis not present

## 2014-10-28 DIAGNOSIS — N2581 Secondary hyperparathyroidism of renal origin: Secondary | ICD-10-CM | POA: Diagnosis not present

## 2014-10-28 DIAGNOSIS — D631 Anemia in chronic kidney disease: Secondary | ICD-10-CM | POA: Diagnosis not present

## 2014-10-29 DIAGNOSIS — R4182 Altered mental status, unspecified: Secondary | ICD-10-CM | POA: Diagnosis not present

## 2014-10-29 DIAGNOSIS — D509 Iron deficiency anemia, unspecified: Secondary | ICD-10-CM | POA: Diagnosis not present

## 2014-10-29 DIAGNOSIS — Z4931 Encounter for adequacy testing for hemodialysis: Secondary | ICD-10-CM | POA: Diagnosis not present

## 2014-10-29 DIAGNOSIS — N2589 Other disorders resulting from impaired renal tubular function: Secondary | ICD-10-CM | POA: Diagnosis not present

## 2014-10-29 DIAGNOSIS — D631 Anemia in chronic kidney disease: Secondary | ICD-10-CM | POA: Diagnosis not present

## 2014-10-29 DIAGNOSIS — N2581 Secondary hyperparathyroidism of renal origin: Secondary | ICD-10-CM | POA: Diagnosis not present

## 2014-10-29 DIAGNOSIS — R410 Disorientation, unspecified: Secondary | ICD-10-CM | POA: Diagnosis not present

## 2014-10-29 DIAGNOSIS — N186 End stage renal disease: Secondary | ICD-10-CM | POA: Diagnosis not present

## 2014-10-30 DIAGNOSIS — N2589 Other disorders resulting from impaired renal tubular function: Secondary | ICD-10-CM | POA: Diagnosis not present

## 2014-10-30 DIAGNOSIS — D509 Iron deficiency anemia, unspecified: Secondary | ICD-10-CM | POA: Diagnosis not present

## 2014-10-30 DIAGNOSIS — N186 End stage renal disease: Secondary | ICD-10-CM | POA: Diagnosis not present

## 2014-10-30 DIAGNOSIS — D631 Anemia in chronic kidney disease: Secondary | ICD-10-CM | POA: Diagnosis not present

## 2014-10-30 DIAGNOSIS — N2581 Secondary hyperparathyroidism of renal origin: Secondary | ICD-10-CM | POA: Diagnosis not present

## 2014-10-30 DIAGNOSIS — Z4931 Encounter for adequacy testing for hemodialysis: Secondary | ICD-10-CM | POA: Diagnosis not present

## 2014-11-01 DIAGNOSIS — N2581 Secondary hyperparathyroidism of renal origin: Secondary | ICD-10-CM | POA: Diagnosis not present

## 2014-11-01 DIAGNOSIS — D509 Iron deficiency anemia, unspecified: Secondary | ICD-10-CM | POA: Diagnosis not present

## 2014-11-01 DIAGNOSIS — D631 Anemia in chronic kidney disease: Secondary | ICD-10-CM | POA: Diagnosis not present

## 2014-11-01 DIAGNOSIS — Z4931 Encounter for adequacy testing for hemodialysis: Secondary | ICD-10-CM | POA: Diagnosis not present

## 2014-11-01 DIAGNOSIS — N186 End stage renal disease: Secondary | ICD-10-CM | POA: Diagnosis not present

## 2014-11-01 DIAGNOSIS — N2589 Other disorders resulting from impaired renal tubular function: Secondary | ICD-10-CM | POA: Diagnosis not present

## 2014-11-02 DIAGNOSIS — D509 Iron deficiency anemia, unspecified: Secondary | ICD-10-CM | POA: Diagnosis not present

## 2014-11-02 DIAGNOSIS — Z4931 Encounter for adequacy testing for hemodialysis: Secondary | ICD-10-CM | POA: Diagnosis not present

## 2014-11-02 DIAGNOSIS — N2589 Other disorders resulting from impaired renal tubular function: Secondary | ICD-10-CM | POA: Diagnosis not present

## 2014-11-02 DIAGNOSIS — D631 Anemia in chronic kidney disease: Secondary | ICD-10-CM | POA: Diagnosis not present

## 2014-11-02 DIAGNOSIS — N186 End stage renal disease: Secondary | ICD-10-CM | POA: Diagnosis not present

## 2014-11-02 DIAGNOSIS — N2581 Secondary hyperparathyroidism of renal origin: Secondary | ICD-10-CM | POA: Diagnosis not present

## 2014-11-03 ENCOUNTER — Telehealth: Payer: Self-pay | Admitting: *Deleted

## 2014-11-03 ENCOUNTER — Ambulatory Visit: Payer: Medicare Other | Admitting: Neurology

## 2014-11-03 NOTE — Telephone Encounter (Signed)
No showed follow up appointment. 

## 2014-11-04 ENCOUNTER — Encounter: Payer: Self-pay | Admitting: Neurology

## 2014-11-04 DIAGNOSIS — D631 Anemia in chronic kidney disease: Secondary | ICD-10-CM | POA: Diagnosis not present

## 2014-11-04 DIAGNOSIS — N2589 Other disorders resulting from impaired renal tubular function: Secondary | ICD-10-CM | POA: Diagnosis not present

## 2014-11-04 DIAGNOSIS — N2581 Secondary hyperparathyroidism of renal origin: Secondary | ICD-10-CM | POA: Diagnosis not present

## 2014-11-04 DIAGNOSIS — D509 Iron deficiency anemia, unspecified: Secondary | ICD-10-CM | POA: Diagnosis not present

## 2014-11-04 DIAGNOSIS — Z4931 Encounter for adequacy testing for hemodialysis: Secondary | ICD-10-CM | POA: Diagnosis not present

## 2014-11-04 DIAGNOSIS — N186 End stage renal disease: Secondary | ICD-10-CM | POA: Diagnosis not present

## 2014-11-05 DIAGNOSIS — M79643 Pain in unspecified hand: Secondary | ICD-10-CM | POA: Diagnosis not present

## 2014-11-05 DIAGNOSIS — G8929 Other chronic pain: Secondary | ICD-10-CM | POA: Diagnosis not present

## 2014-11-06 DIAGNOSIS — N2589 Other disorders resulting from impaired renal tubular function: Secondary | ICD-10-CM | POA: Diagnosis not present

## 2014-11-06 DIAGNOSIS — N186 End stage renal disease: Secondary | ICD-10-CM | POA: Diagnosis not present

## 2014-11-06 DIAGNOSIS — D631 Anemia in chronic kidney disease: Secondary | ICD-10-CM | POA: Diagnosis not present

## 2014-11-06 DIAGNOSIS — N2581 Secondary hyperparathyroidism of renal origin: Secondary | ICD-10-CM | POA: Diagnosis not present

## 2014-11-06 DIAGNOSIS — Z4931 Encounter for adequacy testing for hemodialysis: Secondary | ICD-10-CM | POA: Diagnosis not present

## 2014-11-06 DIAGNOSIS — D509 Iron deficiency anemia, unspecified: Secondary | ICD-10-CM | POA: Diagnosis not present

## 2014-11-07 DIAGNOSIS — N186 End stage renal disease: Secondary | ICD-10-CM | POA: Diagnosis not present

## 2014-11-07 DIAGNOSIS — N2589 Other disorders resulting from impaired renal tubular function: Secondary | ICD-10-CM | POA: Diagnosis not present

## 2014-11-07 DIAGNOSIS — N2581 Secondary hyperparathyroidism of renal origin: Secondary | ICD-10-CM | POA: Diagnosis not present

## 2014-11-07 DIAGNOSIS — D631 Anemia in chronic kidney disease: Secondary | ICD-10-CM | POA: Diagnosis not present

## 2014-11-07 DIAGNOSIS — D509 Iron deficiency anemia, unspecified: Secondary | ICD-10-CM | POA: Diagnosis not present

## 2014-11-07 DIAGNOSIS — Z4931 Encounter for adequacy testing for hemodialysis: Secondary | ICD-10-CM | POA: Diagnosis not present

## 2014-11-08 DIAGNOSIS — N186 End stage renal disease: Secondary | ICD-10-CM | POA: Diagnosis not present

## 2014-11-08 DIAGNOSIS — D509 Iron deficiency anemia, unspecified: Secondary | ICD-10-CM | POA: Diagnosis not present

## 2014-11-08 DIAGNOSIS — N2581 Secondary hyperparathyroidism of renal origin: Secondary | ICD-10-CM | POA: Diagnosis not present

## 2014-11-08 DIAGNOSIS — D631 Anemia in chronic kidney disease: Secondary | ICD-10-CM | POA: Diagnosis not present

## 2014-11-08 DIAGNOSIS — N2589 Other disorders resulting from impaired renal tubular function: Secondary | ICD-10-CM | POA: Diagnosis not present

## 2014-11-08 DIAGNOSIS — Z4931 Encounter for adequacy testing for hemodialysis: Secondary | ICD-10-CM | POA: Diagnosis not present

## 2014-11-10 DIAGNOSIS — Z4931 Encounter for adequacy testing for hemodialysis: Secondary | ICD-10-CM | POA: Diagnosis not present

## 2014-11-10 DIAGNOSIS — N2581 Secondary hyperparathyroidism of renal origin: Secondary | ICD-10-CM | POA: Diagnosis not present

## 2014-11-10 DIAGNOSIS — D509 Iron deficiency anemia, unspecified: Secondary | ICD-10-CM | POA: Diagnosis not present

## 2014-11-10 DIAGNOSIS — N2589 Other disorders resulting from impaired renal tubular function: Secondary | ICD-10-CM | POA: Diagnosis not present

## 2014-11-10 DIAGNOSIS — N186 End stage renal disease: Secondary | ICD-10-CM | POA: Diagnosis not present

## 2014-11-10 DIAGNOSIS — D631 Anemia in chronic kidney disease: Secondary | ICD-10-CM | POA: Diagnosis not present

## 2014-11-11 ENCOUNTER — Encounter: Payer: Self-pay | Admitting: Family Medicine

## 2014-11-11 DIAGNOSIS — N2589 Other disorders resulting from impaired renal tubular function: Secondary | ICD-10-CM | POA: Diagnosis not present

## 2014-11-11 DIAGNOSIS — Z4931 Encounter for adequacy testing for hemodialysis: Secondary | ICD-10-CM | POA: Diagnosis not present

## 2014-11-11 DIAGNOSIS — D509 Iron deficiency anemia, unspecified: Secondary | ICD-10-CM | POA: Diagnosis not present

## 2014-11-11 DIAGNOSIS — N2581 Secondary hyperparathyroidism of renal origin: Secondary | ICD-10-CM | POA: Diagnosis not present

## 2014-11-11 DIAGNOSIS — D631 Anemia in chronic kidney disease: Secondary | ICD-10-CM | POA: Diagnosis not present

## 2014-11-11 DIAGNOSIS — N186 End stage renal disease: Secondary | ICD-10-CM | POA: Diagnosis not present

## 2014-11-12 ENCOUNTER — Encounter: Payer: Self-pay | Admitting: Family Medicine

## 2014-11-13 DIAGNOSIS — N186 End stage renal disease: Secondary | ICD-10-CM | POA: Diagnosis not present

## 2014-11-14 ENCOUNTER — Other Ambulatory Visit: Payer: Self-pay | Admitting: Family Medicine

## 2014-11-14 DIAGNOSIS — M0820 Juvenile rheumatoid arthritis with systemic onset, unspecified site: Secondary | ICD-10-CM

## 2014-11-15 DIAGNOSIS — N186 End stage renal disease: Secondary | ICD-10-CM | POA: Diagnosis not present

## 2014-11-17 DIAGNOSIS — N186 End stage renal disease: Secondary | ICD-10-CM | POA: Diagnosis not present

## 2014-11-17 DIAGNOSIS — I120 Hypertensive chronic kidney disease with stage 5 chronic kidney disease or end stage renal disease: Secondary | ICD-10-CM | POA: Diagnosis not present

## 2014-11-17 DIAGNOSIS — Z992 Dependence on renal dialysis: Secondary | ICD-10-CM | POA: Diagnosis not present

## 2014-11-18 DIAGNOSIS — D689 Coagulation defect, unspecified: Secondary | ICD-10-CM | POA: Diagnosis not present

## 2014-11-18 DIAGNOSIS — Z23 Encounter for immunization: Secondary | ICD-10-CM | POA: Diagnosis not present

## 2014-11-18 DIAGNOSIS — N186 End stage renal disease: Secondary | ICD-10-CM | POA: Diagnosis not present

## 2014-11-20 DIAGNOSIS — Z23 Encounter for immunization: Secondary | ICD-10-CM | POA: Diagnosis not present

## 2014-11-20 DIAGNOSIS — D689 Coagulation defect, unspecified: Secondary | ICD-10-CM | POA: Diagnosis not present

## 2014-11-20 DIAGNOSIS — N186 End stage renal disease: Secondary | ICD-10-CM | POA: Diagnosis not present

## 2014-11-22 DIAGNOSIS — D689 Coagulation defect, unspecified: Secondary | ICD-10-CM | POA: Diagnosis not present

## 2014-11-22 DIAGNOSIS — N186 End stage renal disease: Secondary | ICD-10-CM | POA: Diagnosis not present

## 2014-11-22 DIAGNOSIS — Z23 Encounter for immunization: Secondary | ICD-10-CM | POA: Diagnosis not present

## 2014-11-24 ENCOUNTER — Encounter: Payer: Self-pay | Admitting: Family Medicine

## 2014-11-25 DIAGNOSIS — N2581 Secondary hyperparathyroidism of renal origin: Secondary | ICD-10-CM | POA: Diagnosis not present

## 2014-11-25 DIAGNOSIS — N186 End stage renal disease: Secondary | ICD-10-CM | POA: Diagnosis not present

## 2014-11-25 DIAGNOSIS — E8779 Other fluid overload: Secondary | ICD-10-CM | POA: Diagnosis not present

## 2014-11-25 DIAGNOSIS — D500 Iron deficiency anemia secondary to blood loss (chronic): Secondary | ICD-10-CM | POA: Diagnosis not present

## 2014-11-25 DIAGNOSIS — D689 Coagulation defect, unspecified: Secondary | ICD-10-CM | POA: Diagnosis not present

## 2014-11-27 ENCOUNTER — Encounter: Payer: Self-pay | Admitting: Family Medicine

## 2014-11-27 DIAGNOSIS — D689 Coagulation defect, unspecified: Secondary | ICD-10-CM | POA: Diagnosis not present

## 2014-11-27 DIAGNOSIS — D500 Iron deficiency anemia secondary to blood loss (chronic): Secondary | ICD-10-CM | POA: Diagnosis not present

## 2014-11-27 DIAGNOSIS — E8779 Other fluid overload: Secondary | ICD-10-CM | POA: Diagnosis not present

## 2014-11-27 DIAGNOSIS — N186 End stage renal disease: Secondary | ICD-10-CM | POA: Diagnosis not present

## 2014-11-27 DIAGNOSIS — N2581 Secondary hyperparathyroidism of renal origin: Secondary | ICD-10-CM | POA: Diagnosis not present

## 2014-11-28 ENCOUNTER — Encounter: Payer: Self-pay | Admitting: Family Medicine

## 2014-11-28 DIAGNOSIS — F411 Generalized anxiety disorder: Secondary | ICD-10-CM

## 2014-11-29 DIAGNOSIS — E8779 Other fluid overload: Secondary | ICD-10-CM | POA: Diagnosis not present

## 2014-11-29 DIAGNOSIS — N2581 Secondary hyperparathyroidism of renal origin: Secondary | ICD-10-CM | POA: Diagnosis not present

## 2014-11-29 DIAGNOSIS — D500 Iron deficiency anemia secondary to blood loss (chronic): Secondary | ICD-10-CM | POA: Diagnosis not present

## 2014-11-29 DIAGNOSIS — D689 Coagulation defect, unspecified: Secondary | ICD-10-CM | POA: Diagnosis not present

## 2014-11-29 DIAGNOSIS — N186 End stage renal disease: Secondary | ICD-10-CM | POA: Diagnosis not present

## 2014-12-01 MED ORDER — ESCITALOPRAM OXALATE 10 MG PO TABS: 10.0000 mg | ORAL_TABLET | Freq: Every day | ORAL | Status: DC

## 2014-12-02 DIAGNOSIS — D500 Iron deficiency anemia secondary to blood loss (chronic): Secondary | ICD-10-CM | POA: Diagnosis not present

## 2014-12-02 DIAGNOSIS — N2581 Secondary hyperparathyroidism of renal origin: Secondary | ICD-10-CM | POA: Diagnosis not present

## 2014-12-02 DIAGNOSIS — D689 Coagulation defect, unspecified: Secondary | ICD-10-CM | POA: Diagnosis not present

## 2014-12-02 DIAGNOSIS — N186 End stage renal disease: Secondary | ICD-10-CM | POA: Diagnosis not present

## 2014-12-02 DIAGNOSIS — E8779 Other fluid overload: Secondary | ICD-10-CM | POA: Diagnosis not present

## 2014-12-04 ENCOUNTER — Other Ambulatory Visit: Payer: Self-pay | Admitting: Vascular Surgery

## 2014-12-04 DIAGNOSIS — D500 Iron deficiency anemia secondary to blood loss (chronic): Secondary | ICD-10-CM | POA: Diagnosis not present

## 2014-12-04 DIAGNOSIS — D689 Coagulation defect, unspecified: Secondary | ICD-10-CM | POA: Diagnosis not present

## 2014-12-04 DIAGNOSIS — E8779 Other fluid overload: Secondary | ICD-10-CM | POA: Diagnosis not present

## 2014-12-04 DIAGNOSIS — N186 End stage renal disease: Secondary | ICD-10-CM | POA: Diagnosis not present

## 2014-12-04 DIAGNOSIS — N2581 Secondary hyperparathyroidism of renal origin: Secondary | ICD-10-CM | POA: Diagnosis not present

## 2014-12-06 DIAGNOSIS — N186 End stage renal disease: Secondary | ICD-10-CM | POA: Diagnosis not present

## 2014-12-06 DIAGNOSIS — D689 Coagulation defect, unspecified: Secondary | ICD-10-CM | POA: Diagnosis not present

## 2014-12-06 DIAGNOSIS — D500 Iron deficiency anemia secondary to blood loss (chronic): Secondary | ICD-10-CM | POA: Diagnosis not present

## 2014-12-06 DIAGNOSIS — E8779 Other fluid overload: Secondary | ICD-10-CM | POA: Diagnosis not present

## 2014-12-06 DIAGNOSIS — N2581 Secondary hyperparathyroidism of renal origin: Secondary | ICD-10-CM | POA: Diagnosis not present

## 2014-12-08 ENCOUNTER — Ambulatory Visit
Admission: RE | Admit: 2014-12-08 | Discharge: 2014-12-08 | Disposition: A | Discharge status: Home / Self Care | Payer: Medicare Other | Source: Ambulatory Visit | Attending: Vascular Surgery | Admitting: Vascular Surgery

## 2014-12-08 ENCOUNTER — Encounter
Admission: RE | Disposition: A | Discharge status: Home / Self Care | Payer: Self-pay | Source: Ambulatory Visit | Attending: Vascular Surgery

## 2014-12-08 ENCOUNTER — Encounter: Payer: Self-pay | Admitting: *Deleted

## 2014-12-08 DIAGNOSIS — M549 Dorsalgia, unspecified: Secondary | ICD-10-CM | POA: Insufficient documentation

## 2014-12-08 DIAGNOSIS — I120 Hypertensive chronic kidney disease with stage 5 chronic kidney disease or end stage renal disease: Secondary | ICD-10-CM | POA: Insufficient documentation

## 2014-12-08 DIAGNOSIS — D689 Coagulation defect, unspecified: Secondary | ICD-10-CM | POA: Diagnosis not present

## 2014-12-08 DIAGNOSIS — T82898A Other specified complication of vascular prosthetic devices, implants and grafts, initial encounter: Secondary | ICD-10-CM | POA: Insufficient documentation

## 2014-12-08 DIAGNOSIS — Y841 Kidney dialysis as the cause of abnormal reaction of the patient, or of later complication, without mention of misadventure at the time of the procedure: Secondary | ICD-10-CM | POA: Insufficient documentation

## 2014-12-08 DIAGNOSIS — N186 End stage renal disease: Secondary | ICD-10-CM | POA: Diagnosis not present

## 2014-12-08 DIAGNOSIS — G4730 Sleep apnea, unspecified: Secondary | ICD-10-CM | POA: Diagnosis not present

## 2014-12-08 DIAGNOSIS — E8779 Other fluid overload: Secondary | ICD-10-CM | POA: Diagnosis not present

## 2014-12-08 DIAGNOSIS — Z992 Dependence on renal dialysis: Secondary | ICD-10-CM | POA: Diagnosis not present

## 2014-12-08 DIAGNOSIS — N2581 Secondary hyperparathyroidism of renal origin: Secondary | ICD-10-CM | POA: Diagnosis not present

## 2014-12-08 DIAGNOSIS — Z79899 Other long term (current) drug therapy: Secondary | ICD-10-CM | POA: Diagnosis not present

## 2014-12-08 DIAGNOSIS — T82858A Stenosis of vascular prosthetic devices, implants and grafts, initial encounter: Secondary | ICD-10-CM | POA: Diagnosis not present

## 2014-12-08 DIAGNOSIS — K219 Gastro-esophageal reflux disease without esophagitis: Secondary | ICD-10-CM | POA: Diagnosis not present

## 2014-12-08 DIAGNOSIS — Z7982 Long term (current) use of aspirin: Secondary | ICD-10-CM | POA: Diagnosis not present

## 2014-12-08 DIAGNOSIS — D500 Iron deficiency anemia secondary to blood loss (chronic): Secondary | ICD-10-CM | POA: Diagnosis not present

## 2014-12-08 DIAGNOSIS — F419 Anxiety disorder, unspecified: Secondary | ICD-10-CM | POA: Insufficient documentation

## 2014-12-08 HISTORY — PX: PERIPHERAL VASCULAR CATHETERIZATION: SHX172C

## 2014-12-08 SURGERY — DIALYSIS/PERMA CATHETER INSERTION
Anesthesia: Moderate Sedation | Wound class: Clean

## 2014-12-08 MED ORDER — MIDAZOLAM HCL 2 MG/2ML IJ SOLN
INTRAMUSCULAR | Status: AC
  Filled 2014-12-08: qty 2

## 2014-12-08 MED ORDER — LIDOCAINE-EPINEPHRINE (PF) 1 %-1:200000 IJ SOLN
INTRAMUSCULAR | Status: DC | PRN
  Administered 2014-12-08: 10 mL via INTRADERMAL

## 2014-12-08 MED ORDER — FENTANYL CITRATE (PF) 100 MCG/2ML IJ SOLN
INTRAMUSCULAR | Status: AC
  Filled 2014-12-08: qty 2

## 2014-12-08 MED ORDER — SODIUM CHLORIDE 0.9 % IJ SOLN
INTRAMUSCULAR | Status: AC
  Filled 2014-12-08: qty 15

## 2014-12-08 MED ORDER — MIDAZOLAM HCL 2 MG/2ML IJ SOLN
INTRAMUSCULAR | Status: DC | PRN
  Administered 2014-12-08 (×2): 2 mg via INTRAVENOUS

## 2014-12-08 MED ORDER — HEPARIN (PORCINE) IN NACL 2-0.9 UNIT/ML-% IJ SOLN
INTRAMUSCULAR | Status: AC
  Filled 2014-12-08: qty 500

## 2014-12-08 MED ORDER — IOHEXOL 300 MG/ML  SOLN
INTRAMUSCULAR | Status: DC | PRN
  Administered 2014-12-08: 15 mL via INTRAVENOUS

## 2014-12-08 MED ORDER — HEPARIN SODIUM (PORCINE) 10000 UNIT/ML IJ SOLN
INTRAMUSCULAR | Status: AC
  Filled 2014-12-08: qty 1

## 2014-12-08 MED ORDER — FENTANYL CITRATE (PF) 100 MCG/2ML IJ SOLN
INTRAMUSCULAR | Status: DC | PRN
  Administered 2014-12-08 (×2): 100 ug via INTRAVENOUS

## 2014-12-08 MED ORDER — LIDOCAINE-EPINEPHRINE (PF) 1 %-1:200000 IJ SOLN
INTRAMUSCULAR | Status: AC
  Filled 2014-12-08: qty 30

## 2014-12-08 MED ORDER — DEXTROSE 5 % IV SOLN
1.5000 g | INTRAVENOUS | Status: AC
  Administered 2014-12-08: 1.5 g via INTRAVENOUS
  Filled 2014-12-08: qty 1.5

## 2014-12-08 SURGICAL SUPPLY — 6 items
BIOPATCH WHT 1IN DISK W/4.0 H (GAUZE/BANDAGES/DRESSINGS) ×3 IMPLANT
DIALYSIS DMAX 15.5X28 (CATHETERS) ×3 IMPLANT
GUIDEWIRE SUPER STIFF .035X180 (WIRE) ×3 IMPLANT
PACK ANGIOGRAPHY (CUSTOM PROCEDURE TRAY) ×3 IMPLANT
SUT PROLENE 0 CT 1 30 (SUTURE) ×3 IMPLANT
TOWEL OR 17X26 4PK STRL BLUE (TOWEL DISPOSABLE) ×3 IMPLANT

## 2014-12-08 NOTE — H&P (Signed)
  [facility] History & Physical Update  The patient was interviewed and re-examined.  The patient's previous History and Physical has been reviewed and is unchanged.  There is no change in the plan of care. We plan to proceed with the scheduled procedure.  Marjorie Deprey, MD  12/08/2014, 12:04 PM

## 2014-12-08 NOTE — Op Note (Signed)
OPERATIVE NOTE    PRE-OPERATIVE DIAGNOSIS: 1. ESRD 2. Non-functional permcath  POST-OPERATIVE DIAGNOSIS: same as above  PROCEDURE: 1. Fluoroscopic guidance for placement of catheter 2. Superior venacavagram 3. Placement of a 23 cm tip to cuff tunneled hemodialysis catheter via the left internal jugular vein and removal or previous catheter  SURGEON: Pooja Camuso, MD  ANESTHESIA:  Local/MCS  ESTIMATED BLOOD LOSS: minimal  CONTRAST: 15 cc  FLUORO TIME: < 1 minute  FINDING(S): Patent SVC  SPECIMEN(S):  None  INDICATIONS:   Patient is a 44 y.o.male who presents with non-functional dialysis catheter and ESRD.  The patient needs long term dialysis access for their ESRD, and a Permcath is necessary.  Risks and benefits are discussed and informed consent is obtained.    DESCRIPTION: After obtaining full informed written consent, the patient was brought back to the vascular suited. The patient's existing catheter, neck and chest were sterilely prepped and draped in a sterile surgical field was created.  The existing catheter was dissected free from the fibrous sheath securing the cuff with hemostats and blunt dissection. It had retracted up to the left innominate vein, and I performed a venogram through the catheter to evaluate the SVC and central venous circulation.  These were widely patent with no stenosis.  A wire was placed. The existing catheter was then removed and the wire used to keep venous access. I selected a 23 cm tip to cuff tunneled dialysis catheter.  Using fluoroscopic guidance the catheter tips were parked in the right atrium. The appropriate distal connectors were placed. It withdrew blood well and flushed easily with heparinized saline and a concentrated heparin solution was then placed. It was secured to the chest wall with 2 Prolene sutures. A 4-0 Monocryl pursestring suture was placed around the exit site. Sterile dressings were placed. The patient tolerated the procedure  well and was taken to the recovery room in stable condition.  COMPLICATIONS: None  CONDITION: Stable  Reese Senk 12/08/2014 2:20 PM

## 2014-12-08 NOTE — Discharge Instructions (Signed)
Perm cathThe drugs you were given will stay in your system until tomorrow, so for the next 24 hours you should not.  Drive an automobile. Make any legal decisions.  Drink any alcoholic beverages.  Today you should start with liquids and gradually work up to solid foods as your are able to tolerate them  Resume your regular medications as prescribed by your doctor.  Avoid aspirin for 24 hours.    Change the Band-Aid or dressing as needed.  After a 2 days no dressing as needed.  Avoid strenuous activity for the remainder of the day.  Please notify your primary physician immediately if you have any unusual bleeding, trouble breathing, fever >100 degrees or pain not relieved by the medication your doctor prescribed for your doctor prescribed for you physician  Return to diaslysis  tommorow.

## 2014-12-08 NOTE — OR Nursing (Signed)
Etco2 reading D/c at 2rd reading. Pt alert &Oriented

## 2014-12-09 ENCOUNTER — Encounter: Payer: Self-pay | Admitting: Vascular Surgery

## 2014-12-09 ENCOUNTER — Other Ambulatory Visit: Payer: Self-pay | Admitting: Family Medicine

## 2014-12-09 ENCOUNTER — Encounter: Payer: Self-pay | Admitting: Family Medicine

## 2014-12-09 MED ORDER — ALPRAZOLAM 2 MG PO TABS: ORAL_TABLET | ORAL | Status: DC

## 2014-12-10 DIAGNOSIS — D689 Coagulation defect, unspecified: Secondary | ICD-10-CM | POA: Diagnosis not present

## 2014-12-10 DIAGNOSIS — E8779 Other fluid overload: Secondary | ICD-10-CM | POA: Diagnosis not present

## 2014-12-10 DIAGNOSIS — N2581 Secondary hyperparathyroidism of renal origin: Secondary | ICD-10-CM | POA: Diagnosis not present

## 2014-12-10 DIAGNOSIS — N186 End stage renal disease: Secondary | ICD-10-CM | POA: Diagnosis not present

## 2014-12-10 DIAGNOSIS — D500 Iron deficiency anemia secondary to blood loss (chronic): Secondary | ICD-10-CM | POA: Diagnosis not present

## 2014-12-10 NOTE — H&P (Signed)
[facility] Admission History & Physical  MRN : [id number]  Perry [name] is a 44 y.o. (May 31, 1970) male who presents with chief complaint of No chief complaint on file. Perry [name]  History of Present Illness: patient presents with poorly functional permcath.  Has had multiple failed fistulas previously, and is currently catheter dependent.  Dialysis center requests exchange.  No other complaints.  No current facility-administered medications for this encounter.   Current Outpatient Prescriptions  Medication Sig Dispense Refill  . acetaminophen (TYLENOL) 325 MG tablet Take 650 mg by mouth every 6 (six) hours as needed for mild pain or moderate pain.    Perry [name] apixaban (ELIQUIS) 2.5 MG TABS tablet Take 2.5 mg by mouth 2 (two) times daily.    Perry [name] aspirin 81 MG tablet Take 81 mg by mouth daily.    . calcitRIOL (ROCALTROL) 0.25 MCG capsule Take 0.75 mcg by mouth 3 (three) times daily with meals.     . cinacalcet (SENSIPAR) 60 MG tablet Take 120 mg by mouth every evening.     . escitalopram (LEXAPRO) 10 MG tablet Take 1 tablet (10 mg total) by mouth daily. 30 tablet 5  . HYDROcodone-acetaminophen (NORCO) 5-325 MG per tablet Take 1 tablet by mouth every 6 (six) hours as needed for moderate pain. 30 tablet 0  . levETIRAcetam (KEPPRA XR) 500 MG 24 hr tablet Take 1 tablet (500 mg total) by mouth daily. 30 tablet 11  . LORazepam (ATIVAN) 0.5 MG tablet Take 1 tablet (0.5 mg total) by mouth every 8 (eight) hours. 10 tablet 0  . multivitamin (RENA-VIT) TABS tablet Take 1 tablet by mouth at bedtime.     . Oxycodone HCl 10 MG TABS Take 10 mg by mouth every 4 (four) hours as needed (for pain).     . pantoprazole (PROTONIX) 20 MG tablet Take 1 tablet (20 mg total) by mouth daily. 30 tablet 1  . sevelamer (RENVELA) 800 MG tablet Take 3,200 mg by mouth 3 (three) times daily with meals.     Perry [name] UNABLE TO FIND Dispense one Standard Wheelchair to fit patient. 1 each 0  . vancomycin (VANCOCIN) 50  mg/mL oral solution Take 2.5 mLs (125 mg total) by mouth 4 (four) times daily. 150 mL 0  . alprazolam (XANAX) 2 MG tablet TAKE ONE TABLET BY MOUTH ONCE DAILY AT BEDTIME AS NEEDED 30 tablet 2    Past Medical History  Diagnosis Date  . Dialysis patient (HCC)   . Sleep apnea     cpap-   . Anxiety   . Kidney failure     on dialysis since 2011- DR [facility]- nephrologist   . GERD (gastroesophageal reflux disease)   . High blood pressure     hx of   . Acute meniscal tear of left knee   . Acute meniscal tear of right knee   . Chronic back pain     due to weight   . Hemodialysis patient [facility])     since 2011, does hemodialysis at home, wife does she is a Best boy , Daily except wed and sun  . Seizures [facility])     Past Surgical History  Procedure Laterality Date  . Av fistula repair    . Av fistula placement      left arm -   . Stents in left lower forearm       due to clotting in Av fistula   . Av fistula right wrist     . Av fistula  right upper arm     . Thrombectomies to left lower foreram fistula     . 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 Laparoscopic gastric sleeve resection N/A 05/05/2014    Procedure: LAPAROSCOPIC GASTRIC SLEEVE RESECTION;  Surgeon: Luretha Murphy, MD;  Location: [facility];  Service: General;  Laterality: N/A;  . Peripheral vascular catheterization N/A 06/02/2014    Procedure: A/V Shuntogram/Fistulagram;  Surgeon: Annice Needy, MD;  Location: [facility];  Service: Cardiovascular;  Laterality: N/A;  . Peripheral vascular catheterization N/A 06/02/2014    Procedure: A/V Shunt Intervention;  Surgeon: Annice Needy, MD;  Location: [facility] CV LAB;  Service: Cardiovascular;  Laterality: N/A;  . Peripheral vascular catheterization N/A 06/02/2014    Procedure: Dialysis/Perma Catheter Insertion;  Surgeon: Annice Needy, MD;  Location: [facility];  Service: Cardiovascular;  Laterality: N/A;  . Av fistula placement Left 06/26/2014    Procedure: Left  arm AV fistula creation;  Surgeon: Annice Needy, MD;  Location: [facility] OR[facility];  Service: Vascular;  Laterality: Left;  . Peripheral vascular catheterization N/A 12/08/2014    Procedure: Dialysis/Perma Catheter Insertion;  Surgeon: Annice Needy, MD;  Location: [facility];  Service: Cardiovascular;  Laterality: N/A;    Social History Social History  Substance Use Topics  . Smoking status: Current Every Day Smoker -- 0.50 packs/day for 20 years    Types: Cigarettes    Last Attempt to Quit: 06/17/2013  . Smokeless tobacco: Never Used  . Alcohol Use: No    Family History Family History  Problem Relation Age of Onset  . High blood pressure Mother   no bleeding disorders, no clotting disorders  Allergies  Allergen Reactions  . Depakote [Divalproex Sodium] Other (See Comments)    Hallucinations      REVIEW OF SYSTEMS (Negative unless checked)  Constitutional: Weight loss  Fever  Chills Cardiac: Chest pain   Chest pressure   Palpitations   Shortness of breath when laying flat   Shortness of breath at rest   Shortness of breath with exertion. Vascular:  Pain in legs with walking   Pain in legs at rest   Pain in legs when laying flat   Claudication   Pain in feet when walking  Pain in feet at rest  Pain in feet when laying flat   History of DVT   Phlebitis   Swelling in legs   Varicose veins   Non-healing ulcers Pulmonary:   Uses home oxygen   Productive cough   Hemoptysis   Wheeze  COPD   Asthma Neurologic:  Dizziness  Blackouts   Seizures   History of stroke   History of TIA  Aphasia   Temporary blindness   Dysphagia   Weakness or numbness in arms   Weakness or numbness in legs Musculoskeletal:  Arthritis   Joint swelling   Joint pain   Low back pain Hematologic:  Easy bruising  Easy bleeding   Hypercoagulable state   Anemic  Hepatitis Gastrointestinal:  Blood in stool   Vomiting blood   Gastroesophageal reflux/heartburn   Difficulty swallowing. Genitourinary:  Chronic kidney disease   Difficult urination  Frequent urination  Burning with urination   Blood in urine Skin:  Rashes   Ulcers   Wounds Psychological:  History of anxiety    History of major depression.  Physical Examination  Filed Vitals:   12/08/14 1221 12/08/14 1345 12/08/14  1400 12/08/14 1410  BP: 96/70 100/58  97/52  Resp:  11    Height: 5\' 6"  (1.676 m)     Weight: 108.863 kg (240 lb)     SpO2: 94% 96% 96%    Body mass index is 38.76 kg/(m^2). Gen: WD/WN, NAD Head: Beat[place]/AT, No temporalis wasting. Prominent temp pulse not noted. Ear/Nose/Throat: Hearing grossly intact, nares w/o erythema or drainage, oropharynx w/o Erythema/Exudate,  Eyes: PERRLA, EOMI.  Neck: Supple, no nuchal rigidity.  No bruit or JVD.  Pulmonary:  Good air movement, clear to auscultation bilaterally, no use of accessory muscles.  Cardiac: RRR, normal S1, S2, no Murmurs, rubs or gallops. Vascular: left jugular permcath without erythema or drainage Vessel Right Left  Radial Palpable Palpable                                   Gastrointestinal: soft, non-tender/non-distended. No guarding/reflex.  Musculoskeletal: M/S 5/5 throughout.  Extremities without ischemic changes.  No deformity or atrophy.  Neurologic: CN 2-12 intact. Pain and light touch intact in extremities.  Symmetrical.  Speech is fluent. Motor exam as listed above. Psychiatric: Judgment intact, Mood & affect appropriate for pt's clinical situation. Dermatologic: No rashes or ulcers noted.  No cellulitis or open wounds. Lymph : No Cervical, Axillary, or Inguinal lymphadenopathy.     CBC Lab Results  Component Value Date   WBC 10.1 08/07/2014   HGB 9.4* 08/07/2014   HCT 32.8* 08/07/2014   MCV 103.8* 08/07/2014   PLT 410* 08/07/2014    BMET    Component Value Date/Time   NA 138 08/10/2014 0351   NA 135 05/15/2014 0612   K 3.1*  08/10/2014 0351   K 5.9* 05/15/2014 0612   CL 95* 08/10/2014 0351   CL 92* 05/15/2014 0612   CO2 26 08/10/2014 0351   CO2 21* 05/15/2014 0612   GLUCOSE 74 08/10/2014 0351   GLUCOSE 97 05/15/2014 0612   BUN 15 08/10/2014 0351   BUN 77* 05/15/2014 0612   CREATININE 8.55* 08/10/2014 0351   CREATININE 12.78* 07/03/2014 1458   CREATININE 18.38* 05/15/2014 0612   CALCIUM 8.7* 08/10/2014 0351   CALCIUM 8.6* 05/15/2014 0612   GFRNONAA 7* 08/10/2014 0351   GFRNONAA 4* 07/03/2014 1458   GFRNONAA 3* 05/15/2014 0612   GFRAA 8* 08/10/2014 0351   GFRAA 5* 07/03/2014 1458   GFRAA 3* 05/15/2014 0612   CrCl cannot be calculated (Patient has no serum creatinine result on file.).  COAG Lab Results  Component Value Date   INR 0.99 06/07/2012    Radiology No results found.    Assessment/Plan 1. ESRD. Catheter not working well.  2. Dysfunction of dialysis access.  Plan exchange of catheter today.  Risks and benefits discussed 3. HTN. Stable. Continue outpatient meds   DEW,JASON, MD  12/10/2014 7:52 AM

## 2014-12-13 DIAGNOSIS — D689 Coagulation defect, unspecified: Secondary | ICD-10-CM | POA: Diagnosis not present

## 2014-12-13 DIAGNOSIS — N186 End stage renal disease: Secondary | ICD-10-CM | POA: Diagnosis not present

## 2014-12-13 DIAGNOSIS — N2581 Secondary hyperparathyroidism of renal origin: Secondary | ICD-10-CM | POA: Diagnosis not present

## 2014-12-13 DIAGNOSIS — D500 Iron deficiency anemia secondary to blood loss (chronic): Secondary | ICD-10-CM | POA: Diagnosis not present

## 2014-12-13 DIAGNOSIS — E8779 Other fluid overload: Secondary | ICD-10-CM | POA: Diagnosis not present

## 2014-12-15 ENCOUNTER — Ambulatory Visit
Admission: AD | Admit: 2014-12-15 | Discharge: 2014-12-15 | Disposition: A | Discharge status: Home / Self Care | Payer: Medicare Other | Source: Ambulatory Visit | Attending: Vascular Surgery | Admitting: Vascular Surgery

## 2014-12-15 ENCOUNTER — Encounter: Payer: Self-pay | Admitting: *Deleted

## 2014-12-15 ENCOUNTER — Encounter
Admission: AD | Disposition: A | Discharge status: Home / Self Care | Payer: Self-pay | Source: Ambulatory Visit | Attending: Vascular Surgery

## 2014-12-15 ENCOUNTER — Other Ambulatory Visit: Payer: Self-pay | Admitting: Vascular Surgery

## 2014-12-15 ENCOUNTER — Encounter: Payer: Self-pay | Admitting: Family Medicine

## 2014-12-15 DIAGNOSIS — G4730 Sleep apnea, unspecified: Secondary | ICD-10-CM | POA: Diagnosis not present

## 2014-12-15 DIAGNOSIS — N2581 Secondary hyperparathyroidism of renal origin: Secondary | ICD-10-CM | POA: Diagnosis not present

## 2014-12-15 DIAGNOSIS — F17210 Nicotine dependence, cigarettes, uncomplicated: Secondary | ICD-10-CM | POA: Insufficient documentation

## 2014-12-15 DIAGNOSIS — Z992 Dependence on renal dialysis: Secondary | ICD-10-CM | POA: Insufficient documentation

## 2014-12-15 DIAGNOSIS — Z79899 Other long term (current) drug therapy: Secondary | ICD-10-CM | POA: Diagnosis not present

## 2014-12-15 DIAGNOSIS — Z7982 Long term (current) use of aspirin: Secondary | ICD-10-CM | POA: Insufficient documentation

## 2014-12-15 DIAGNOSIS — E8779 Other fluid overload: Secondary | ICD-10-CM | POA: Diagnosis not present

## 2014-12-15 DIAGNOSIS — K219 Gastro-esophageal reflux disease without esophagitis: Secondary | ICD-10-CM | POA: Insufficient documentation

## 2014-12-15 DIAGNOSIS — Y841 Kidney dialysis as the cause of abnormal reaction of the patient, or of later complication, without mention of misadventure at the time of the procedure: Secondary | ICD-10-CM | POA: Diagnosis not present

## 2014-12-15 DIAGNOSIS — Z7901 Long term (current) use of anticoagulants: Secondary | ICD-10-CM | POA: Insufficient documentation

## 2014-12-15 DIAGNOSIS — D500 Iron deficiency anemia secondary to blood loss (chronic): Secondary | ICD-10-CM | POA: Diagnosis not present

## 2014-12-15 DIAGNOSIS — M549 Dorsalgia, unspecified: Secondary | ICD-10-CM | POA: Diagnosis not present

## 2014-12-15 DIAGNOSIS — D689 Coagulation defect, unspecified: Secondary | ICD-10-CM | POA: Diagnosis not present

## 2014-12-15 DIAGNOSIS — Z452 Encounter for adjustment and management of vascular access device: Secondary | ICD-10-CM | POA: Diagnosis not present

## 2014-12-15 DIAGNOSIS — I120 Hypertensive chronic kidney disease with stage 5 chronic kidney disease or end stage renal disease: Secondary | ICD-10-CM | POA: Insufficient documentation

## 2014-12-15 DIAGNOSIS — N186 End stage renal disease: Secondary | ICD-10-CM | POA: Diagnosis not present

## 2014-12-15 HISTORY — PX: PERIPHERAL VASCULAR CATHETERIZATION: SHX172C

## 2014-12-15 LAB — POTASSIUM (ARMC VASCULAR LAB ONLY): Potassium (ARMC vascular lab): 3.9 (ref 3.5–5.1)

## 2014-12-15 SURGERY — DIALYSIS/PERMA CATHETER INSERTION
Anesthesia: Moderate Sedation

## 2014-12-15 MED ORDER — MIDAZOLAM HCL 5 MG/5ML IJ SOLN
INTRAMUSCULAR | Status: AC
  Filled 2014-12-15: qty 5

## 2014-12-15 MED ORDER — FENTANYL CITRATE (PF) 100 MCG/2ML IJ SOLN
INTRAMUSCULAR | Status: DC | PRN
  Administered 2014-12-15: 25 ug via INTRAVENOUS
  Administered 2014-12-15: 50 ug via INTRAVENOUS

## 2014-12-15 MED ORDER — SODIUM CHLORIDE 0.9 % IV SOLN: INTRAVENOUS | Status: DC

## 2014-12-15 MED ORDER — LIDOCAINE-EPINEPHRINE (PF) 1 %-1:200000 IJ SOLN
INTRAMUSCULAR | Status: AC
  Filled 2014-12-15: qty 30

## 2014-12-15 MED ORDER — HEPARIN SODIUM (PORCINE) 10000 UNIT/ML IJ SOLN
INTRAMUSCULAR | Status: AC
  Filled 2014-12-15: qty 1

## 2014-12-15 MED ORDER — MIDAZOLAM HCL 2 MG/2ML IJ SOLN
INTRAMUSCULAR | Status: DC | PRN
  Administered 2014-12-15: 2 mg via INTRAVENOUS
  Administered 2014-12-15: 1 mg via INTRAVENOUS

## 2014-12-15 MED ORDER — FENTANYL CITRATE (PF) 100 MCG/2ML IJ SOLN
INTRAMUSCULAR | Status: AC
  Filled 2014-12-15: qty 2

## 2014-12-15 SURGICAL SUPPLY — 5 items
CATH DIALYSIS PAL 33 888814504 (CATHETERS) IMPLANT
DIALYSIS DMAX 15.5X32 (CATHETERS) ×4 IMPLANT
PACK ANGIOGRAPHY (CUSTOM PROCEDURE TRAY) ×4 IMPLANT
TOWEL OR 17X26 4PK STRL BLUE (TOWEL DISPOSABLE) ×4 IMPLANT
WIRE AMPLATZ SS .035X180 (WIRE) ×4 IMPLANT

## 2014-12-15 NOTE — Op Note (Signed)
OPERATIVE NOTE    PRE-OPERATIVE DIAGNOSIS: 1. ESRD 2. Non-functional permcath  POST-OPERATIVE DIAGNOSIS: same as above  PROCEDURE: 1. Fluoroscopic guidance for placement of catheter 2. Placement of a 27 cm tip to cuff tunneled hemodialysis catheter via the left internal jugular vein and removal or previous catheter  SURGEON: Florrie Ramires, MD  ANESTHESIA:  Local/MCS  ESTIMATED BLOOD LOSS: 25 cc  FINDING(S): none  SPECIMEN(S):  None  INDICATIONS:   Patient is a 44 y.o.male who presents with non-functional dialysis catheter and ESRD. His existing catheter has retracted up to the innominate/SVC junction and is not working well The patient needs long term dialysis access for their ESRD, and a Permcath is necessary.  Risks and benefits are discussed and informed consent is obtained.    DESCRIPTION: After obtaining full informed written consent, the patient was brought back to the vascular suited. The patient's existing catheter, neck and chest were sterilely prepped and draped in a sterile surgical field was created.  The existing catheter was dissected free from the fibrous sheath securing the cuff with hemostats and blunt dissection.  A wire was placed. The existing catheter was then removed and the wire used to keep venous access. I selected a 27 cm tip to cuff tunneled dialysis catheter.  Using fluoroscopic guidance the catheter tips were parked in the right atrium. The appropriate distal connectors were placed. It withdrew blood well and flushed easily with heparinized saline and a concentrated heparin solution was then placed. It was secured to the chest wall with 2 Prolene sutures. A 4-0 Monocryl pursestring suture was placed around the exit site. Sterile dressings were placed. The patient tolerated the procedure well and was taken to the recovery room in stable condition.  COMPLICATIONS: None  CONDITION: Stable  Jacqulyne Gladue 12/15/2014 3:57 PM

## 2014-12-15 NOTE — H&P (Signed)
  [place] [facility] History & Physical Update  The patient was interviewed and re-examined.  The patient's previous History and Physical has been reviewed and is unchanged.  The catheter we placed last week is still not working well.  Will exchange today. We plan to proceed with the scheduled procedure.  Jesus Poplin, MD  12/15/2014, 2:50 PM

## 2014-12-15 NOTE — Discharge Instructions (Signed)
Central Line Dialysis Access Placement, Care After °Refer to this sheet after your procedure. These instructions provide you with information on caring for yourself after your procedure. Your health care provider may also give you more specific instructions. Your treatment has been planned according to current medical practices, but problems sometimes occur. Call your health care provider if you have any problems or questions after your procedure.  °WHAT TO EXPECT AFTER THE PROCEDURE °· You may feel some discomfort after the local anesthetic wears off. Your discomfort should gradually improve over the next several days. Ask your health care provider if you can take pain medicines. °· You may notice some redness, mild pain, swelling, bruising, or light bleeding at the site where the thin, flexible tube (catheter) was placed. These should improve over the next several days. °HOME CARE INSTRUCTIONS  °· Rest for the remainder of the day. °· Avoid any heavy lifting (more than 10 lb [4.5 kg]) for at least 3 days.   °· If your catheter bandage (dressing) becomes soaked with blood, apply firm and direct pressure on the insertion site and sit up for 20 minutes. Your dialysis nurses will change your dressing during your next visit or at your next dialysis treatment. °· Keep the dressing around the insertion site dry. Avoid using the shower. You may take a bath after 24 hours. Bathe in a tub and keep the dressing and catheter above the level of the water. Do not submerge catheter underwater, such as by swimming. °· You may resume your usual diet.  °· Do not operate heavy machinery, drive, or make legal decisions for the first 24 hours after the procedure if you were given sedatives or other medicines to help you relax.   °SEEK MEDICAL CARE IF: °· The catheter gets pulled or comes out. °· You develop any signs of infection around the insertion site such as:   °¨ Bleeding that does not stop even after applying pressure as  instructed. °¨ Increased pain. °¨ Unusual drainage such as pus.   °· You develop a fever or chills.   °· You have any other questions or concerns related to your procedure or the care of your central line.   °SEEK IMMEDIATE MEDICAL CARE IF: °· You develop lightheadedness or dizziness.   °· You faint.   °· You develop shortness of breath or difficulty breathing.   °· You have any symptoms of an allergic reaction, such as: °¨ Itching.   °¨ Rash at the site of insertion. °  °This information is not intended to replace advice given to you by your health care provider. Make sure you discuss any questions you have with your health care provider. °  °Document Released: 08/18/2003 Document Revised: 01/08/2013 Document Reviewed: 10/13/2011 °Elsevier Interactive Patient Education ©2016 [employer] ° ° °

## 2014-12-15 NOTE — OR Nursing (Signed)
15:50 BP low 60-70's systolic : 100cc bolus given IV

## 2014-12-16 ENCOUNTER — Encounter: Payer: Self-pay | Admitting: Family Medicine

## 2014-12-16 ENCOUNTER — Encounter: Payer: Self-pay | Admitting: Vascular Surgery

## 2014-12-16 DIAGNOSIS — N2581 Secondary hyperparathyroidism of renal origin: Secondary | ICD-10-CM | POA: Diagnosis not present

## 2014-12-16 DIAGNOSIS — N186 End stage renal disease: Secondary | ICD-10-CM | POA: Diagnosis not present

## 2014-12-16 DIAGNOSIS — D689 Coagulation defect, unspecified: Secondary | ICD-10-CM | POA: Diagnosis not present

## 2014-12-16 DIAGNOSIS — D500 Iron deficiency anemia secondary to blood loss (chronic): Secondary | ICD-10-CM | POA: Diagnosis not present

## 2014-12-16 DIAGNOSIS — E8779 Other fluid overload: Secondary | ICD-10-CM | POA: Diagnosis not present

## 2014-12-17 DIAGNOSIS — Z992 Dependence on renal dialysis: Secondary | ICD-10-CM | POA: Diagnosis not present

## 2014-12-17 DIAGNOSIS — N186 End stage renal disease: Secondary | ICD-10-CM | POA: Diagnosis not present

## 2014-12-17 DIAGNOSIS — I120 Hypertensive chronic kidney disease with stage 5 chronic kidney disease or end stage renal disease: Secondary | ICD-10-CM | POA: Diagnosis not present

## 2014-12-17 NOTE — H&P (Signed)
[facility] Admission History & Physical  MRN : [id number]  Perry [name] is a 44 y.o. ([birth year]) male who presents with chief complaint of No chief complaint on file. [name]  History of Present Illness: Patient is a 44 year old male with long-standing end-stage renal disease. He has been a patient in our practice for many years. He had multiple failed arm access placements some of which worked for a while, but he has now been catheter dependent for several months. His catheter flow is very poor and his dialysis center has sent him over requesting exchange. He had an exchange reasonably recently as the catheter tips had retracted back into the left innominate vein. He has some extra fluid weight with resultant fatigue and shortness of breath with exertion with his poor dialysis treatments recently. He has no fevers or chills or signs systemic infection. He has no other complaints today.  No current facility-administered medications for this encounter.   Current Outpatient Prescriptions  Medication Sig Dispense Refill  . acetaminophen (TYLENOL) 325 MG tablet Take 650 mg by mouth every 6 (six) hours as needed for mild pain or moderate pain.    [name] alprazolam (XANAX) 2 MG tablet TAKE ONE TABLET BY MOUTH ONCE DAILY AT BEDTIME AS NEEDED 30 tablet 2  . apixaban (ELIQUIS) 2.5 MG TABS tablet Take 2.5 mg by mouth 2 (two) times daily.    [name] aspirin 81 MG tablet Take 81 mg by mouth daily.    . calcitRIOL (ROCALTROL) 0.25 MCG capsule Take 0.75 mcg by mouth 3 (three) times daily with meals.     . cinacalcet (SENSIPAR) 60 MG tablet Take 120 mg by mouth every evening.     . escitalopram (LEXAPRO) 10 MG tablet Take 1 tablet (10 mg total) by mouth daily. 30 tablet 5  . HYDROcodone-acetaminophen (NORCO) 5-325 MG per tablet Take 1 tablet by mouth every 6 (six) hours as needed for moderate pain. 30 tablet 0  . levETIRAcetam (KEPPRA XR) 500 MG 24 hr tablet Take 1 tablet (500 mg total) by mouth  daily. 30 tablet 11  . LORazepam (ATIVAN) 0.5 MG tablet Take 1 tablet (0.5 mg total) by mouth every 8 (eight) hours. 10 tablet 0  . multivitamin (RENA-VIT) TABS tablet Take 1 tablet by mouth at bedtime.     . Oxycodone HCl 10 MG TABS Take 10 mg by mouth every 4 (four) hours as needed (for pain).     . pantoprazole (PROTONIX) 20 MG tablet Take 1 tablet (20 mg total) by mouth daily. 30 tablet 1  . sevelamer (RENVELA) 800 MG tablet Take 3,200 mg by mouth 3 (three) times daily with meals.     [name] UNABLE TO FIND Dispense one Standard Wheelchair to fit patient. 1 each 0  . vancomycin (VANCOCIN) 50 mg/mL oral solution Take 2.5 mLs (125 mg total) by mouth 4 (four) times daily. 150 mL 0    Past Medical History  Diagnosis Date  . Dialysis patient (HCC)   . Sleep apnea     cpap-   . Anxiety   . Kidney failure     on dialysis since 2011- [facility]- nephrologist   . GERD (gastroesophageal reflux disease)   . High blood pressure     hx of   . Acute meniscal tear of left knee   . Acute meniscal tear of right knee   . Chronic back pain     due to weight   . Hemodialysis patient (HCC)  since 2011, does hemodialysis at home, wife does she is a [employer] , Daily except wed and sun  . Seizures [facility](HCC)     Past Surgical History  Procedure Laterality Date  . Av fistula repair    . Av fistula placement      left arm -   . Stents in left lower forearm       due to clotting in Av fistula   . Av fistula right wrist     . Av fistula right upper arm     . Thrombectomies to left lower foreram fistula     . 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 Laparoscopic gastric sleeve resection N/A 05/05/2014    Procedure: LAPAROSCOPIC GASTRIC SLEEVE RESECTION;  Surgeon: Luretha MurphyMatthew Martin, MD;  Location: [facility];  Service: General;  Laterality: N/A;  . Peripheral vascular catheterization N/A 06/02/2014    Procedure: A/V Shuntogram/Fistulagram;  Surgeon: Annice NeedyJason S Dew, MD;  Location: [facility] INVASIVE CV LAB;  Service:  Cardiovascular;  Laterality: N/A;  . Peripheral vascular catheterization N/A 06/02/2014    Procedure: A/V Shunt Intervention;  Surgeon: Annice NeedyJason S Dew, MD;  Location: [facility] INVASIVE CV LAB;  Service: Cardiovascular;  Laterality: N/A;  . Peripheral vascular catheterization N/A 06/02/2014    Procedure: Dialysis/Perma Catheter Insertion;  Surgeon: Annice NeedyJason S Dew, MD;  Location: [facility] INVASIVE CV LAB;  Service: Cardiovascular;  Laterality: N/A;  . Av fistula placement Left 06/26/2014    Procedure: Left arm AV fistula creation;  Surgeon: Annice NeedyJason S Dew, MD;  Location: [facility] ORS;  Service: Vascular;  Laterality: Left;  . Peripheral vascular catheterization N/A 12/08/2014    Procedure: Dialysis/Perma Catheter Insertion;  Surgeon: Annice NeedyJason S Dew, MD;  Location: [facility] INVASIVE CV LAB;  Service: Cardiovascular;  Laterality: N/A;  . Peripheral vascular catheterization N/A 12/15/2014    Procedure: Dialysis/Perma Catheter Insertion;  Surgeon: Annice NeedyJason S Dew, MD;  Location: [facility] INVASIVE CV LAB;  Service: Cardiovascular;  Laterality: N/A;  . Peripheral vascular catheterization  12/15/2014    Procedure: Dialysis/Perma Catheter Removal;  Surgeon: Annice NeedyJason S Dew, MD;  Location: [facility] INVASIVE CV LAB;  Service: Cardiovascular;;    Social History Social History  Substance Use Topics  . Smoking status: Current Every Day Smoker -- 0.50 packs/day for 20 years    Types: Cigarettes    Last Attempt to Quit: 06/17/2013  . Smokeless tobacco: Never Used  . Alcohol Use: No   lives with spouse  Family History Family History  Problem Relation Age of Onset  . High blood pressure Mother    no history of bleeding disorders, clotting disorders, or autoimmune diseases.  Allergies  Allergen Reactions  . Depakote [Divalproex Sodium] Other (See Comments)    Hallucinations      REVIEW OF SYSTEMS (Negative unless checked)  Constitutional: [] Weight loss  [] Fever  [] Chills Cardiac: [] Chest pain   [] Chest pressure   [] Palpitations   [] Shortness  of breath when laying flat   [] Shortness of breath at rest   [x] Shortness of breath with exertion. Vascular:  [] Pain in legs with walking   [] Pain in legs at rest   [] Pain in legs when laying flat   [] Claudication   [] Pain in feet when walking  [] Pain in feet at rest  [] Pain in feet when laying flat   [] History of DVT   [] Phlebitis   [] Swelling in legs   [] Varicose veins   [] Non-healing ulcers Pulmonary:   [] Uses home oxygen   [] Productive  cough   Hemoptysis   Wheeze  COPD   Asthma Neurologic:  Dizziness  Blackouts   Seizures   History of stroke   History of TIA  Aphasia   Temporary blindness   Dysphagia   Weakness or numbness in arms   Weakness or numbness in legs Musculoskeletal:  Arthritis   Joint swelling   Joint pain   Low back pain Hematologic:  Easy bruising  Easy bleeding   Hypercoagulable state   Anemic  Hepatitis Gastrointestinal:  Blood in stool   Vomiting blood  Gastroesophageal reflux/heartburn   Difficulty swallowing. Genitourinary:  Chronic kidney disease   Difficult urination  Frequent urination  Burning with urination   Blood in urine Skin:  Rashes   Ulcers   Wounds Psychological:  History of anxiety    History of major depression.  Physical Examination  Filed Vitals:   12/15/14 1349 12/15/14 1607 12/15/14 1619 12/15/14 1623  BP: 99/72 119/68 128/79 121/86  Pulse: 91  90 92  Temp: 98.2 F (36.8 C)     Resp: Height:  (1.676 m)     Weight: 108.863 kg (240 lb)     SpO2: 97% 98% 98% 98%   Body mass index is 38.76 kg/(m^2). Gen: WD/WN, NAD Head: Maynard/AT, No temporalis wasting. Prominent temp pulse not noted. Ear/Nose/Throat: Hearing grossly intact, nares w/o erythema or drainage, oropharynx w/o Erythema/Exudate,  Eyes: PERRLA, EOMI.  Neck: Supple, no nuchal rigidity.  No bruit or JVD.  Pulmonary:  Good air movement, clear to auscultation bilaterally, no use of accessory muscles.   Cardiac: RRR, normal S1, S2, no Murmurs, rubs or gallops. Vascular: Left jugular PermCath without erythema or drainage. Vessel Right Left  Radial Palpable Palpable                                   Gastrointestinal: soft, non-tender/non-distended. No guarding/reflex.  Musculoskeletal: M/S 5/5 throughout.  Extremities without ischemic changes.  No deformity or atrophy.  Neurologic: CN 2-12 intact. Pain and light touch intact in extremities.  Symmetrical.  Speech is fluent. Motor exam as listed above. Psychiatric: Judgment intact, Mood & affect appropriate for pt's clinical situation. Dermatologic: No rashes or ulcers noted.  No cellulitis or open wounds. Lymph : No Cervical, Axillary, or Inguinal lymphadenopathy.     CBC Lab Results  Component Value Date   WBC 10.1 08/07/2014   HGB 9.4* 08/07/2014   HCT 32.8* 08/07/2014   MCV 103.8* 08/07/2014   PLT 410* 08/07/2014    BMET    Component Value Date/Time   NA 138 08/10/2014 0351   NA 135 05/15/2014 0612   K 3.1* 08/10/2014 0351   K 5.9* 05/15/2014 0612   CL 95* 08/10/2014 0351   CL 92* 05/15/2014 0612   CO2 26 08/10/2014 0351   CO2 21* 05/15/2014 0612   GLUCOSE 74 08/10/2014 0351   GLUCOSE 97 05/15/2014 0612   BUN 15 08/10/2014 0351   BUN 77* 05/15/2014 0612   CREATININE 8.55* 08/10/2014 0351   CREATININE 12.78* 07/03/2014 1458   CREATININE 18.38* 05/15/2014 0612   CALCIUM 8.7* 08/10/2014 0351   CALCIUM 8.6* 05/15/2014 0612   GFRNONAA 7* 08/10/2014 0351   GFRNONAA 4* 07/03/2014 1458   GFRNONAA 3* 05/15/2014 0612   GFRAA 8* 08/10/2014 0351   GFRAA 5* 07/03/2014 1458   GFRAA 3* 05/15/2014 0612   CrCl cannot be calculated (Patient has  no serum creatinine result on file.).  COAG Lab Results  Component Value Date   INR 0.99 06/07/2012    Radiology No results found.    Assessment/Plan 1. Nonfunctional PermCath. Has had previous exchange would still not running well. We'll plan another exchange today  is catheter tip is not parked as deep in the right atrium as it was previously. 2. End-stage renal disease. Has now had a catheter for many months as he has had multiple failed previous arm access placements. Should consider a further evaluation for a new AV graft or fistula as long term catheter dependence is not preferable. 3. Hypertension. Stable. Continue outpatient medicines.   DEW,JASON, MD  12/17/2014 4:30 PM

## 2014-12-18 ENCOUNTER — Other Ambulatory Visit: Payer: Self-pay | Admitting: Family Medicine

## 2014-12-18 ENCOUNTER — Encounter: Payer: Self-pay | Admitting: Family Medicine

## 2014-12-18 MED ORDER — OXYCODONE HCL 10 MG PO TABS: 10.0000 mg | ORAL_TABLET | ORAL | Status: DC | PRN

## 2014-12-19 DIAGNOSIS — D500 Iron deficiency anemia secondary to blood loss (chronic): Secondary | ICD-10-CM | POA: Diagnosis not present

## 2014-12-19 DIAGNOSIS — D689 Coagulation defect, unspecified: Secondary | ICD-10-CM | POA: Diagnosis not present

## 2014-12-19 DIAGNOSIS — N2581 Secondary hyperparathyroidism of renal origin: Secondary | ICD-10-CM | POA: Diagnosis not present

## 2014-12-19 DIAGNOSIS — N186 End stage renal disease: Secondary | ICD-10-CM | POA: Diagnosis not present

## 2014-12-19 DIAGNOSIS — Z23 Encounter for immunization: Secondary | ICD-10-CM | POA: Diagnosis not present

## 2014-12-23 ENCOUNTER — Encounter: Payer: Self-pay | Admitting: Family Medicine

## 2014-12-23 ENCOUNTER — Ambulatory Visit (INDEPENDENT_AMBULATORY_CARE_PROVIDER_SITE_OTHER): Payer: Medicare Other | Admitting: Family Medicine

## 2014-12-23 VITALS — BP 108/62 | HR 78 | Temp 98.5°F | Resp 20 | Wt 253.0 lb

## 2014-12-23 DIAGNOSIS — M545 Low back pain, unspecified: Secondary | ICD-10-CM

## 2014-12-23 NOTE — Progress Notes (Signed)
Subjective:    Patient ID: Perry [name], male    DOB: 04-Nov-1970, 44 y.o.   MRN: [id number]  HPI Patient states that he has severe back pain located between the levels of T10 and L2. Is exquisitely tender to the touch in that area. He reports that the pain seems to spread around his rib cage bilaterally. He denies any numbness or tingling in his legs. He denies any weakness in his legs. He denies any symptoms of cauda equina syndrome.  He does have a history of end-stage renal disease on dialysis and he has been noncompliant in taking his phosphate binder raising the concern of renal osteodystrophy and secondary osteoporosis. He denies any traumatic injury to that area. Past Medical History  Diagnosis Date  . Dialysis patient (HCC)   . Sleep apnea     cpap-   . Anxiety   . Kidney failure     on dialysis since 2011- DR [facility]- nephrologist   . GERD (gastroesophageal reflux disease)   . High blood pressure     hx of   . Acute meniscal tear of left knee   . Acute meniscal tear of right knee   . Chronic back pain     due to weight   . Hemodialysis patient [facility])     since 2011, does hemodialysis at home, wife does she is a [employer] , Daily except wed and sun  . Seizures [facility])    Past Surgical History  Procedure Laterality Date  . Av fistula repair    . Av fistula placement      left arm -   . Stents in left lower forearm       due to clotting in Av fistula   . Av fistula right wrist     . Av fistula right upper arm     . Thrombectomies to left lower foreram fistula     . 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 Laparoscopic gastric sleeve resection N/A 05/05/2014    Procedure: LAPAROSCOPIC GASTRIC SLEEVE RESECTION;  Surgeon: Luretha Murphy, MD;  Location: [facility];  Service: General;  Laterality: N/A;  . Peripheral vascular catheterization N/A 06/02/2014    Procedure: A/V Shuntogram/Fistulagram;  Surgeon: Annice Needy, MD;  Location: [facility] INVASIVE CV LAB;  Service:  Cardiovascular;  Laterality: N/A;  . Peripheral vascular catheterization N/A 06/02/2014    Procedure: A/V Shunt Intervention;  Surgeon: Annice Needy, MD;  Location: [facility] INVASIVE CV LAB;  Service: Cardiovascular;  Laterality: N/A;  . Peripheral vascular catheterization N/A 06/02/2014    Procedure: Dialysis/Perma Catheter Insertion;  Surgeon: Annice Needy, MD;  Location: [facility] INVAS[facility] CV LAB;  Service: Cardiovascular;  Laterality: N/A;  . Av fistula placement Left 06/26/2014    Procedure: Left arm AV fistula creation;  Surgeon: Annice Needy, MD;  Location: [facility] ORS;  Service: Vascular;  Laterality: Left;  . Peripheral vascular catheterization N/A 12/08/2014    Procedure: Dialysis/Perma Catheter Insertion;  Surgeon: Annice Needy, MD;  Location: [facility] INVAS[facility] CV LAB;  Service: Cardiovascular;  Laterality: N/A;  . Peripheral vascular catheterization N/A 12/15/2014    Procedure: Dialysis/Perma Catheter Insertion;  Surgeon: Annice Needy, MD;  Location: [facility] INV[facility] CV LAB;  Service: Cardiovascular;  Laterality: N/A;  . Peripheral vascular catheterization  12/15/2014    Procedure: Dialysis/Perma Catheter Removal;  Surgeon: Annice Needy, MD;  Location: [facility] INVASIVE CV LAB;  Service: Cardiovascular;;   Current  Outpatient Prescriptions on File Prior to Visit  Medication Sig Dispense Refill  . acetaminophen (TYLENOL) 325 MG tablet Take 650 mg by mouth every 6 (six) hours as needed for mild pain or moderate pain.    [name] alprazolam (XANAX) 2 MG tablet TAKE ONE TABLET BY MOUTH ONCE DAILY AT BEDTIME AS NEEDED 30 tablet 2  . apixaban (ELIQUIS) 2.5 MG TABS tablet Take 2.5 mg by mouth 2 (two) times daily.    [name] aspirin 81 MG tablet Take 81 mg by mouth daily.    . calcitRIOL (ROCALTROL) 0.25 MCG capsule Take 0.75 mcg by mouth 3 (three) times daily with meals.     . cinacalcet (SENSIPAR) 60 MG tablet Take 120 mg by mouth every evening.     . escitalopram (LEXAPRO) 10 MG tablet Take 1 tablet (10 mg total) by mouth daily. 30  tablet 5  . HYDROcodone-acetaminophen (NORCO) 5-325 MG per tablet Take 1 tablet by mouth every 6 (six) hours as needed for moderate pain. 30 tablet 0  . levETIRAcetam (KEPPRA XR) 500 MG 24 hr tablet Take 1 tablet (500 mg total) by mouth daily. 30 tablet 11  . LORazepam (ATIVAN) 0.5 MG tablet Take 1 tablet (0.5 mg total) by mouth every 8 (eight) hours. 10 tablet 0  . multivitamin (RENA-VIT) TABS tablet Take 1 tablet by mouth at bedtime.     . Oxycodone HCl 10 MG TABS Take 1 tablet (10 mg total) by mouth every 4 (four) hours as needed (for pain). 120 tablet 0  . pantoprazole (PROTONIX) 20 MG tablet Take 1 tablet (20 mg total) by mouth daily. 30 tablet 1  . sevelamer (RENVELA) 800 MG tablet Take 3,200 mg by mouth 3 (three) times daily with meals.     [name] UNABLE TO FIND Dispense one Standard Wheelchair to fit patient. 1 each 0  . vancomycin (VANCOCIN) 50 mg/mL oral solution Take 2.5 mLs (125 mg total) by mouth 4 (four) times daily. 150 mL 0   No current facility-administered medications on file prior to visit.   Allergies  Allergen Reactions  . Depakote [Divalproex Sodium] Other (See Comments)    Hallucinations    Social History   Social History  . Marital Status: Married    Spouse Name: N/A  . Number of Children: 2  . Years of Education: 13   Occupational History  . Disabled     Disabled   Social History Main Topics  . Smoking status: Current Every Day Smoker -- 0.50 packs/day for 20 years    Types: Cigarettes    Last Attempt to Quit: 06/17/2013  . Smokeless tobacco: Never Used  . Alcohol Use: No  . Drug Use: No  . Sexual Activity: Yes   Other Topics Concern  . Not on file   Social History Narrative   Patient lives home at home with his wife Hydrographic surveyor ). Patient is disabled.    Caffeine-one cup of coffee daily.   Right handed.      Review of Systems  All other systems reviewed and are negative.      Objective:   Physical Exam  Cardiovascular: Normal rate, regular  rhythm and normal heart sounds.   Pulmonary/Chest: Effort normal and breath sounds normal.  Musculoskeletal:       Thoracic back: He exhibits decreased range of motion, tenderness and bony tenderness.       Lumbar back: He exhibits decreased range of motion, tenderness and bony tenderness.  Neurological: He has normal reflexes. He displays  normal reflexes. He exhibits normal muscle tone. Coordination normal.  Vitals reviewed.         Assessment & Plan:  Midline low back pain without sciatica - Plan: DG Lumbar Spine Complete, DG Thoracic Spine W/Swimmers  Begin by obtaining x-rays of thoracic and lumbar spine to rule out a vertebral fracture. I suspect a muscular strain in the back secondary to his body habitus. If the x-rays are negative, I will treat the patient with muscle relaxers.

## 2014-12-24 ENCOUNTER — Ambulatory Visit (HOSPITAL_COMMUNITY)
Admission: RE | Admit: 2014-12-24 | Discharge: 2014-12-24 | Disposition: A | Discharge status: Home / Self Care | Payer: Medicare Other | Source: Ambulatory Visit | Attending: Family Medicine | Admitting: Family Medicine

## 2014-12-24 DIAGNOSIS — M545 Low back pain, unspecified: Secondary | ICD-10-CM

## 2014-12-24 DIAGNOSIS — M546 Pain in thoracic spine: Secondary | ICD-10-CM | POA: Diagnosis not present

## 2014-12-24 DIAGNOSIS — M549 Dorsalgia, unspecified: Secondary | ICD-10-CM | POA: Diagnosis not present

## 2014-12-25 ENCOUNTER — Encounter: Payer: Self-pay | Admitting: Family Medicine

## 2014-12-29 MED ORDER — CYCLOBENZAPRINE HCL 10 MG PO TABS: 10.0000 mg | ORAL_TABLET | Freq: Three times a day (TID) | ORAL | Status: DC

## 2014-12-29 NOTE — Telephone Encounter (Signed)
Medication called/sent to requested pharmacy  

## 2014-12-30 DIAGNOSIS — T82858D Stenosis of vascular prosthetic devices, implants and grafts, subsequent encounter: Secondary | ICD-10-CM | POA: Diagnosis not present

## 2014-12-30 DIAGNOSIS — I871 Compression of vein: Secondary | ICD-10-CM | POA: Diagnosis not present

## 2014-12-30 DIAGNOSIS — Z992 Dependence on renal dialysis: Secondary | ICD-10-CM | POA: Diagnosis not present

## 2014-12-30 DIAGNOSIS — N186 End stage renal disease: Secondary | ICD-10-CM | POA: Diagnosis not present

## 2015-01-12 ENCOUNTER — Encounter: Payer: Self-pay | Admitting: Family Medicine

## 2015-01-12 DIAGNOSIS — M0820 Juvenile rheumatoid arthritis with systemic onset, unspecified site: Secondary | ICD-10-CM

## 2015-01-13 DIAGNOSIS — M0820 Juvenile rheumatoid arthritis with systemic onset, unspecified site: Secondary | ICD-10-CM | POA: Insufficient documentation

## 2015-01-14 ENCOUNTER — Encounter: Payer: Self-pay | Admitting: Family Medicine

## 2015-01-15 MED ORDER — OXYCODONE HCL 10 MG PO TABS: 10.0000 mg | ORAL_TABLET | ORAL | Status: DC | PRN

## 2015-01-15 NOTE — Telephone Encounter (Signed)
OK refill?? 

## 2015-01-15 NOTE — Telephone Encounter (Signed)
Ok with refill for 1 month.  What is status of pain clinic referral?

## 2015-01-15 NOTE — Telephone Encounter (Signed)
my chart message back to pt RX ready for pick up

## 2015-01-17 DIAGNOSIS — I120 Hypertensive chronic kidney disease with stage 5 chronic kidney disease or end stage renal disease: Secondary | ICD-10-CM | POA: Diagnosis not present

## 2015-01-17 DIAGNOSIS — Z992 Dependence on renal dialysis: Secondary | ICD-10-CM | POA: Diagnosis not present

## 2015-01-17 DIAGNOSIS — N186 End stage renal disease: Secondary | ICD-10-CM | POA: Diagnosis not present

## 2015-01-19 DIAGNOSIS — Z23 Encounter for immunization: Secondary | ICD-10-CM | POA: Diagnosis not present

## 2015-01-19 DIAGNOSIS — N186 End stage renal disease: Secondary | ICD-10-CM | POA: Diagnosis not present

## 2015-01-19 DIAGNOSIS — N2581 Secondary hyperparathyroidism of renal origin: Secondary | ICD-10-CM | POA: Diagnosis not present

## 2015-01-19 DIAGNOSIS — D689 Coagulation defect, unspecified: Secondary | ICD-10-CM | POA: Diagnosis not present

## 2015-01-21 DIAGNOSIS — N2581 Secondary hyperparathyroidism of renal origin: Secondary | ICD-10-CM | POA: Diagnosis not present

## 2015-01-21 DIAGNOSIS — D689 Coagulation defect, unspecified: Secondary | ICD-10-CM | POA: Diagnosis not present

## 2015-01-21 DIAGNOSIS — N186 End stage renal disease: Secondary | ICD-10-CM | POA: Diagnosis not present

## 2015-01-21 DIAGNOSIS — Z23 Encounter for immunization: Secondary | ICD-10-CM | POA: Diagnosis not present

## 2015-01-23 DIAGNOSIS — D689 Coagulation defect, unspecified: Secondary | ICD-10-CM | POA: Diagnosis not present

## 2015-01-23 DIAGNOSIS — N2581 Secondary hyperparathyroidism of renal origin: Secondary | ICD-10-CM | POA: Diagnosis not present

## 2015-01-23 DIAGNOSIS — N186 End stage renal disease: Secondary | ICD-10-CM | POA: Diagnosis not present

## 2015-01-23 DIAGNOSIS — Z23 Encounter for immunization: Secondary | ICD-10-CM | POA: Diagnosis not present

## 2015-01-26 ENCOUNTER — Other Ambulatory Visit: Payer: Self-pay | Admitting: Family Medicine

## 2015-01-26 DIAGNOSIS — N186 End stage renal disease: Secondary | ICD-10-CM | POA: Diagnosis not present

## 2015-01-26 DIAGNOSIS — Z23 Encounter for immunization: Secondary | ICD-10-CM | POA: Diagnosis not present

## 2015-01-26 DIAGNOSIS — N2581 Secondary hyperparathyroidism of renal origin: Secondary | ICD-10-CM | POA: Diagnosis not present

## 2015-01-26 DIAGNOSIS — D689 Coagulation defect, unspecified: Secondary | ICD-10-CM | POA: Diagnosis not present

## 2015-01-27 MED ORDER — CYCLOBENZAPRINE HCL 10 MG PO TABS: 10.0000 mg | ORAL_TABLET | Freq: Three times a day (TID) | ORAL | Status: DC

## 2015-01-27 NOTE — Telephone Encounter (Signed)
LRF 12/12/156 #30.  LOV 12/23/14  OK refill?

## 2015-01-27 NOTE — Telephone Encounter (Signed)
ok 

## 2015-01-27 NOTE — Telephone Encounter (Signed)
Prescription sent to pharmacy.

## 2015-01-28 DIAGNOSIS — D689 Coagulation defect, unspecified: Secondary | ICD-10-CM | POA: Diagnosis not present

## 2015-01-28 DIAGNOSIS — N2581 Secondary hyperparathyroidism of renal origin: Secondary | ICD-10-CM | POA: Diagnosis not present

## 2015-01-28 DIAGNOSIS — Z23 Encounter for immunization: Secondary | ICD-10-CM | POA: Diagnosis not present

## 2015-01-28 DIAGNOSIS — N186 End stage renal disease: Secondary | ICD-10-CM | POA: Diagnosis not present

## 2015-01-30 ENCOUNTER — Other Ambulatory Visit: Payer: Self-pay | Admitting: Family Medicine

## 2015-01-30 DIAGNOSIS — N2581 Secondary hyperparathyroidism of renal origin: Secondary | ICD-10-CM | POA: Diagnosis not present

## 2015-01-30 DIAGNOSIS — D689 Coagulation defect, unspecified: Secondary | ICD-10-CM | POA: Diagnosis not present

## 2015-01-30 DIAGNOSIS — Z23 Encounter for immunization: Secondary | ICD-10-CM | POA: Diagnosis not present

## 2015-01-30 DIAGNOSIS — N186 End stage renal disease: Secondary | ICD-10-CM | POA: Diagnosis not present

## 2015-01-30 MED ORDER — PERMETHRIN 5 % EX CREA: 1.0000 "application " | TOPICAL_CREAM | Freq: Once | CUTANEOUS | Status: DC

## 2015-02-02 DIAGNOSIS — D689 Coagulation defect, unspecified: Secondary | ICD-10-CM | POA: Diagnosis not present

## 2015-02-02 DIAGNOSIS — N186 End stage renal disease: Secondary | ICD-10-CM | POA: Diagnosis not present

## 2015-02-02 DIAGNOSIS — Z23 Encounter for immunization: Secondary | ICD-10-CM | POA: Diagnosis not present

## 2015-02-02 DIAGNOSIS — N2581 Secondary hyperparathyroidism of renal origin: Secondary | ICD-10-CM | POA: Diagnosis not present

## 2015-02-04 DIAGNOSIS — N186 End stage renal disease: Secondary | ICD-10-CM | POA: Diagnosis not present

## 2015-02-04 DIAGNOSIS — N2581 Secondary hyperparathyroidism of renal origin: Secondary | ICD-10-CM | POA: Diagnosis not present

## 2015-02-04 DIAGNOSIS — D689 Coagulation defect, unspecified: Secondary | ICD-10-CM | POA: Diagnosis not present

## 2015-02-04 DIAGNOSIS — Z23 Encounter for immunization: Secondary | ICD-10-CM | POA: Diagnosis not present

## 2015-02-06 DIAGNOSIS — D689 Coagulation defect, unspecified: Secondary | ICD-10-CM | POA: Diagnosis not present

## 2015-02-06 DIAGNOSIS — N2581 Secondary hyperparathyroidism of renal origin: Secondary | ICD-10-CM | POA: Diagnosis not present

## 2015-02-06 DIAGNOSIS — N186 End stage renal disease: Secondary | ICD-10-CM | POA: Diagnosis not present

## 2015-02-06 DIAGNOSIS — Z23 Encounter for immunization: Secondary | ICD-10-CM | POA: Diagnosis not present

## 2015-02-09 DIAGNOSIS — N2581 Secondary hyperparathyroidism of renal origin: Secondary | ICD-10-CM | POA: Diagnosis not present

## 2015-02-09 DIAGNOSIS — N186 End stage renal disease: Secondary | ICD-10-CM | POA: Diagnosis not present

## 2015-02-09 DIAGNOSIS — D689 Coagulation defect, unspecified: Secondary | ICD-10-CM | POA: Diagnosis not present

## 2015-02-09 DIAGNOSIS — Z23 Encounter for immunization: Secondary | ICD-10-CM | POA: Diagnosis not present

## 2015-02-11 DIAGNOSIS — N186 End stage renal disease: Secondary | ICD-10-CM | POA: Diagnosis not present

## 2015-02-11 DIAGNOSIS — D689 Coagulation defect, unspecified: Secondary | ICD-10-CM | POA: Diagnosis not present

## 2015-02-11 DIAGNOSIS — N2581 Secondary hyperparathyroidism of renal origin: Secondary | ICD-10-CM | POA: Diagnosis not present

## 2015-02-11 DIAGNOSIS — Z23 Encounter for immunization: Secondary | ICD-10-CM | POA: Diagnosis not present

## 2015-02-13 DIAGNOSIS — N186 End stage renal disease: Secondary | ICD-10-CM | POA: Diagnosis not present

## 2015-02-13 DIAGNOSIS — D689 Coagulation defect, unspecified: Secondary | ICD-10-CM | POA: Diagnosis not present

## 2015-02-13 DIAGNOSIS — N2581 Secondary hyperparathyroidism of renal origin: Secondary | ICD-10-CM | POA: Diagnosis not present

## 2015-02-13 DIAGNOSIS — Z23 Encounter for immunization: Secondary | ICD-10-CM | POA: Diagnosis not present

## 2015-02-14 DIAGNOSIS — S32028A Other fracture of second lumbar vertebra, initial encounter for closed fracture: Secondary | ICD-10-CM | POA: Diagnosis not present

## 2015-02-14 DIAGNOSIS — I672 Cerebral atherosclerosis: Secondary | ICD-10-CM | POA: Diagnosis not present

## 2015-02-14 DIAGNOSIS — R220 Localized swelling, mass and lump, head: Secondary | ICD-10-CM | POA: Diagnosis not present

## 2015-02-14 DIAGNOSIS — S22009A Unspecified fracture of unspecified thoracic vertebra, initial encounter for closed fracture: Secondary | ICD-10-CM | POA: Diagnosis present

## 2015-02-14 DIAGNOSIS — R402413 Glasgow coma scale score 13-15, at hospital admission: Secondary | ICD-10-CM | POA: Diagnosis present

## 2015-02-14 DIAGNOSIS — S22048A Other fracture of fourth thoracic vertebra, initial encounter for closed fracture: Secondary | ICD-10-CM | POA: Diagnosis not present

## 2015-02-14 DIAGNOSIS — S299XXA Unspecified injury of thorax, initial encounter: Secondary | ICD-10-CM | POA: Diagnosis not present

## 2015-02-14 DIAGNOSIS — M5030 Other cervical disc degeneration, unspecified cervical region: Secondary | ICD-10-CM | POA: Diagnosis not present

## 2015-02-14 DIAGNOSIS — S32038A Other fracture of third lumbar vertebra, initial encounter for closed fracture: Secondary | ICD-10-CM | POA: Diagnosis not present

## 2015-02-14 DIAGNOSIS — S22069A Unspecified fracture of T7-T8 vertebra, initial encounter for closed fracture: Secondary | ICD-10-CM | POA: Diagnosis present

## 2015-02-14 DIAGNOSIS — T1490 Injury, unspecified: Secondary | ICD-10-CM | POA: Diagnosis not present

## 2015-02-14 DIAGNOSIS — S0993XA Unspecified injury of face, initial encounter: Secondary | ICD-10-CM | POA: Diagnosis not present

## 2015-02-14 DIAGNOSIS — J9601 Acute respiratory failure with hypoxia: Secondary | ICD-10-CM | POA: Diagnosis not present

## 2015-02-14 DIAGNOSIS — S3991XA Unspecified injury of abdomen, initial encounter: Secondary | ICD-10-CM | POA: Diagnosis not present

## 2015-02-14 DIAGNOSIS — S01112D Laceration without foreign body of left eyelid and periocular area, subsequent encounter: Secondary | ICD-10-CM | POA: Diagnosis not present

## 2015-02-14 DIAGNOSIS — S22079A Unspecified fracture of T9-T10 vertebra, initial encounter for closed fracture: Secondary | ICD-10-CM | POA: Diagnosis present

## 2015-02-14 DIAGNOSIS — T797XXA Traumatic subcutaneous emphysema, initial encounter: Secondary | ICD-10-CM | POA: Diagnosis present

## 2015-02-14 DIAGNOSIS — S3992XA Unspecified injury of lower back, initial encounter: Secondary | ICD-10-CM | POA: Diagnosis not present

## 2015-02-14 DIAGNOSIS — S22059A Unspecified fracture of T5-T6 vertebra, initial encounter for closed fracture: Secondary | ICD-10-CM | POA: Diagnosis present

## 2015-02-14 DIAGNOSIS — S22049A Unspecified fracture of fourth thoracic vertebra, initial encounter for closed fracture: Secondary | ICD-10-CM | POA: Diagnosis present

## 2015-02-14 DIAGNOSIS — S0183XA Puncture wound without foreign body of other part of head, initial encounter: Secondary | ICD-10-CM | POA: Diagnosis not present

## 2015-02-14 DIAGNOSIS — S01112A Laceration without foreign body of left eyelid and periocular area, initial encounter: Secondary | ICD-10-CM | POA: Diagnosis not present

## 2015-02-14 DIAGNOSIS — S22039A Unspecified fracture of third thoracic vertebra, initial encounter for closed fracture: Secondary | ICD-10-CM | POA: Diagnosis present

## 2015-02-14 DIAGNOSIS — S3993XA Unspecified injury of pelvis, initial encounter: Secondary | ICD-10-CM | POA: Diagnosis not present

## 2015-02-14 DIAGNOSIS — S22058A Other fracture of T5-T6 vertebra, initial encounter for closed fracture: Secondary | ICD-10-CM | POA: Diagnosis not present

## 2015-02-14 DIAGNOSIS — S199XXA Unspecified injury of neck, initial encounter: Secondary | ICD-10-CM | POA: Diagnosis not present

## 2015-02-14 DIAGNOSIS — Z992 Dependence on renal dialysis: Secondary | ICD-10-CM | POA: Diagnosis not present

## 2015-02-14 DIAGNOSIS — R000 Tachycardia, unspecified: Secondary | ICD-10-CM | POA: Diagnosis not present

## 2015-02-14 DIAGNOSIS — S01412A Laceration without foreign body of left cheek and temporomandibular area, initial encounter: Secondary | ICD-10-CM | POA: Diagnosis present

## 2015-02-14 DIAGNOSIS — I120 Hypertensive chronic kidney disease with stage 5 chronic kidney disease or end stage renal disease: Secondary | ICD-10-CM | POA: Diagnosis present

## 2015-02-14 DIAGNOSIS — D649 Anemia, unspecified: Secondary | ICD-10-CM | POA: Diagnosis present

## 2015-02-14 DIAGNOSIS — S22018A Other fracture of first thoracic vertebra, initial encounter for closed fracture: Secondary | ICD-10-CM | POA: Diagnosis not present

## 2015-02-14 DIAGNOSIS — J9811 Atelectasis: Secondary | ICD-10-CM | POA: Diagnosis not present

## 2015-02-14 DIAGNOSIS — M549 Dorsalgia, unspecified: Secondary | ICD-10-CM | POA: Diagnosis not present

## 2015-02-14 DIAGNOSIS — I499 Cardiac arrhythmia, unspecified: Secondary | ICD-10-CM | POA: Diagnosis not present

## 2015-02-14 DIAGNOSIS — S060X9A Concussion with loss of consciousness of unspecified duration, initial encounter: Secondary | ICD-10-CM | POA: Diagnosis not present

## 2015-02-14 DIAGNOSIS — I959 Hypotension, unspecified: Secondary | ICD-10-CM | POA: Diagnosis not present

## 2015-02-14 DIAGNOSIS — S0083XA Contusion of other part of head, initial encounter: Secondary | ICD-10-CM | POA: Diagnosis not present

## 2015-02-14 DIAGNOSIS — S22019A Unspecified fracture of first thoracic vertebra, initial encounter for closed fracture: Secondary | ICD-10-CM | POA: Diagnosis not present

## 2015-02-14 DIAGNOSIS — R109 Unspecified abdominal pain: Secondary | ICD-10-CM | POA: Diagnosis not present

## 2015-02-14 DIAGNOSIS — N186 End stage renal disease: Secondary | ICD-10-CM | POA: Diagnosis present

## 2015-02-14 DIAGNOSIS — I6523 Occlusion and stenosis of bilateral carotid arteries: Secondary | ICD-10-CM | POA: Diagnosis present

## 2015-02-14 DIAGNOSIS — Z452 Encounter for adjustment and management of vascular access device: Secondary | ICD-10-CM | POA: Diagnosis not present

## 2015-02-14 DIAGNOSIS — E875 Hyperkalemia: Secondary | ICD-10-CM | POA: Diagnosis not present

## 2015-02-14 DIAGNOSIS — M47814 Spondylosis without myelopathy or radiculopathy, thoracic region: Secondary | ICD-10-CM | POA: Diagnosis present

## 2015-02-14 DIAGNOSIS — Z4682 Encounter for fitting and adjustment of non-vascular catheter: Secondary | ICD-10-CM | POA: Diagnosis not present

## 2015-02-14 DIAGNOSIS — S22028A Other fracture of second thoracic vertebra, initial encounter for closed fracture: Secondary | ICD-10-CM | POA: Diagnosis not present

## 2015-02-14 DIAGNOSIS — S22038A Other fracture of third thoracic vertebra, initial encounter for closed fracture: Secondary | ICD-10-CM | POA: Diagnosis not present

## 2015-02-14 DIAGNOSIS — R402422 Glasgow coma scale score 9-12, at arrival to emergency department: Secondary | ICD-10-CM | POA: Diagnosis present

## 2015-02-14 DIAGNOSIS — S22029A Unspecified fracture of second thoracic vertebra, initial encounter for closed fracture: Secondary | ICD-10-CM | POA: Diagnosis present

## 2015-02-16 ENCOUNTER — Encounter: Payer: Self-pay | Admitting: Family Medicine

## 2015-02-16 ENCOUNTER — Other Ambulatory Visit: Payer: Self-pay | Admitting: Family Medicine

## 2015-02-16 MED ORDER — OXYCODONE HCL 10 MG PO TABS: 10.0000 mg | ORAL_TABLET | ORAL | Status: DC | PRN

## 2015-02-16 NOTE — Telephone Encounter (Signed)
ok 

## 2015-02-16 NOTE — Telephone Encounter (Signed)
Pt advised through MyChart that RX ready.

## 2015-02-16 NOTE — Telephone Encounter (Signed)
LRF 01/15/15 #120  LOV 12/23/14  OK refill?

## 2015-02-17 DIAGNOSIS — I120 Hypertensive chronic kidney disease with stage 5 chronic kidney disease or end stage renal disease: Secondary | ICD-10-CM | POA: Diagnosis not present

## 2015-02-17 DIAGNOSIS — Z992 Dependence on renal dialysis: Secondary | ICD-10-CM | POA: Diagnosis not present

## 2015-02-17 DIAGNOSIS — N186 End stage renal disease: Secondary | ICD-10-CM | POA: Diagnosis not present

## 2015-02-18 ENCOUNTER — Encounter: Payer: Self-pay | Admitting: Family Medicine

## 2015-02-18 ENCOUNTER — Other Ambulatory Visit: Payer: Self-pay | Admitting: Family Medicine

## 2015-02-18 MED ORDER — CYCLOBENZAPRINE HCL 10 MG PO TABS: 10.0000 mg | ORAL_TABLET | Freq: Three times a day (TID) | ORAL | Status: DC

## 2015-02-18 NOTE — Telephone Encounter (Signed)
One refill sent 

## 2015-02-19 DIAGNOSIS — D500 Iron deficiency anemia secondary to blood loss (chronic): Secondary | ICD-10-CM | POA: Diagnosis not present

## 2015-02-19 DIAGNOSIS — N2581 Secondary hyperparathyroidism of renal origin: Secondary | ICD-10-CM | POA: Diagnosis not present

## 2015-02-19 DIAGNOSIS — D689 Coagulation defect, unspecified: Secondary | ICD-10-CM | POA: Diagnosis not present

## 2015-02-19 DIAGNOSIS — N186 End stage renal disease: Secondary | ICD-10-CM | POA: Diagnosis not present

## 2015-02-20 DIAGNOSIS — D689 Coagulation defect, unspecified: Secondary | ICD-10-CM | POA: Diagnosis not present

## 2015-02-20 DIAGNOSIS — N186 End stage renal disease: Secondary | ICD-10-CM | POA: Diagnosis not present

## 2015-02-20 DIAGNOSIS — D500 Iron deficiency anemia secondary to blood loss (chronic): Secondary | ICD-10-CM | POA: Diagnosis not present

## 2015-02-20 DIAGNOSIS — N2581 Secondary hyperparathyroidism of renal origin: Secondary | ICD-10-CM | POA: Diagnosis not present

## 2015-02-23 DIAGNOSIS — D689 Coagulation defect, unspecified: Secondary | ICD-10-CM | POA: Diagnosis not present

## 2015-02-23 DIAGNOSIS — D500 Iron deficiency anemia secondary to blood loss (chronic): Secondary | ICD-10-CM | POA: Diagnosis not present

## 2015-02-23 DIAGNOSIS — N2581 Secondary hyperparathyroidism of renal origin: Secondary | ICD-10-CM | POA: Diagnosis not present

## 2015-02-23 DIAGNOSIS — N186 End stage renal disease: Secondary | ICD-10-CM | POA: Diagnosis not present

## 2015-02-25 DIAGNOSIS — D689 Coagulation defect, unspecified: Secondary | ICD-10-CM | POA: Diagnosis not present

## 2015-02-25 DIAGNOSIS — N186 End stage renal disease: Secondary | ICD-10-CM | POA: Diagnosis not present

## 2015-02-25 DIAGNOSIS — D500 Iron deficiency anemia secondary to blood loss (chronic): Secondary | ICD-10-CM | POA: Diagnosis not present

## 2015-02-25 DIAGNOSIS — N2581 Secondary hyperparathyroidism of renal origin: Secondary | ICD-10-CM | POA: Diagnosis not present

## 2015-02-27 DIAGNOSIS — N186 End stage renal disease: Secondary | ICD-10-CM | POA: Diagnosis not present

## 2015-02-27 DIAGNOSIS — N2581 Secondary hyperparathyroidism of renal origin: Secondary | ICD-10-CM | POA: Diagnosis not present

## 2015-02-27 DIAGNOSIS — D689 Coagulation defect, unspecified: Secondary | ICD-10-CM | POA: Diagnosis not present

## 2015-02-27 DIAGNOSIS — D500 Iron deficiency anemia secondary to blood loss (chronic): Secondary | ICD-10-CM | POA: Diagnosis not present

## 2015-03-02 DIAGNOSIS — N2581 Secondary hyperparathyroidism of renal origin: Secondary | ICD-10-CM | POA: Diagnosis not present

## 2015-03-02 DIAGNOSIS — N186 End stage renal disease: Secondary | ICD-10-CM | POA: Diagnosis not present

## 2015-03-02 DIAGNOSIS — D689 Coagulation defect, unspecified: Secondary | ICD-10-CM | POA: Diagnosis not present

## 2015-03-02 DIAGNOSIS — D500 Iron deficiency anemia secondary to blood loss (chronic): Secondary | ICD-10-CM | POA: Diagnosis not present

## 2015-03-04 DIAGNOSIS — D500 Iron deficiency anemia secondary to blood loss (chronic): Secondary | ICD-10-CM | POA: Diagnosis not present

## 2015-03-04 DIAGNOSIS — N186 End stage renal disease: Secondary | ICD-10-CM | POA: Diagnosis not present

## 2015-03-04 DIAGNOSIS — N2581 Secondary hyperparathyroidism of renal origin: Secondary | ICD-10-CM | POA: Diagnosis not present

## 2015-03-04 DIAGNOSIS — D689 Coagulation defect, unspecified: Secondary | ICD-10-CM | POA: Diagnosis not present

## 2015-03-05 ENCOUNTER — Ambulatory Visit (INDEPENDENT_AMBULATORY_CARE_PROVIDER_SITE_OTHER): Payer: Medicare Other | Admitting: Family Medicine

## 2015-03-05 ENCOUNTER — Encounter: Payer: Self-pay | Admitting: Family Medicine

## 2015-03-05 VITALS — BP 130/74 | HR 78 | Temp 98.0°F | Resp 20 | Wt 252.0 lb

## 2015-03-05 DIAGNOSIS — Z09 Encounter for follow-up examination after completed treatment for conditions other than malignant neoplasm: Secondary | ICD-10-CM | POA: Diagnosis not present

## 2015-03-05 MED ORDER — OXYCODONE HCL 10 MG PO TABS: 10.0000 mg | ORAL_TABLET | ORAL | Status: DC | PRN
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
## 2015-03-06 DIAGNOSIS — N2581 Secondary hyperparathyroidism of renal origin: Secondary | ICD-10-CM | POA: Diagnosis not present

## 2015-03-06 DIAGNOSIS — D689 Coagulation defect, unspecified: Secondary | ICD-10-CM | POA: Diagnosis not present

## 2015-03-06 DIAGNOSIS — D500 Iron deficiency anemia secondary to blood loss (chronic): Secondary | ICD-10-CM | POA: Diagnosis not present

## 2015-03-06 DIAGNOSIS — N186 End stage renal disease: Secondary | ICD-10-CM | POA: Diagnosis not present

## 2015-03-07 ENCOUNTER — Other Ambulatory Visit: Payer: Self-pay | Admitting: Family Medicine

## 2015-03-09 ENCOUNTER — Other Ambulatory Visit: Payer: Self-pay | Admitting: *Deleted

## 2015-03-09 ENCOUNTER — Encounter: Payer: Self-pay | Admitting: Family Medicine

## 2015-03-09 DIAGNOSIS — N186 End stage renal disease: Secondary | ICD-10-CM

## 2015-03-09 DIAGNOSIS — D689 Coagulation defect, unspecified: Secondary | ICD-10-CM | POA: Diagnosis not present

## 2015-03-09 DIAGNOSIS — D500 Iron deficiency anemia secondary to blood loss (chronic): Secondary | ICD-10-CM | POA: Diagnosis not present

## 2015-03-09 DIAGNOSIS — Z01810 Encounter for preprocedural cardiovascular examination: Secondary | ICD-10-CM

## 2015-03-09 DIAGNOSIS — N2581 Secondary hyperparathyroidism of renal origin: Secondary | ICD-10-CM | POA: Diagnosis not present

## 2015-03-09 MED ORDER — CYCLOBENZAPRINE HCL 10 MG PO TABS: 10.0000 mg | ORAL_TABLET | Freq: Three times a day (TID) | ORAL | Status: DC

## 2015-03-09 NOTE — Telephone Encounter (Signed)
ok 

## 2015-03-09 NOTE — Telephone Encounter (Signed)
rx called in

## 2015-03-09 NOTE — Telephone Encounter (Signed)
Flexeril RX sent to pharm

## 2015-03-09 NOTE — Telephone Encounter (Signed)
LRF 12/09/14 #30 + 2.  LOV 03/05/15  OK refill?

## 2015-03-11 DIAGNOSIS — D689 Coagulation defect, unspecified: Secondary | ICD-10-CM | POA: Diagnosis not present

## 2015-03-11 DIAGNOSIS — N186 End stage renal disease: Secondary | ICD-10-CM | POA: Diagnosis not present

## 2015-03-11 DIAGNOSIS — N2581 Secondary hyperparathyroidism of renal origin: Secondary | ICD-10-CM | POA: Diagnosis not present

## 2015-03-11 DIAGNOSIS — D500 Iron deficiency anemia secondary to blood loss (chronic): Secondary | ICD-10-CM | POA: Diagnosis not present

## 2015-03-13 DIAGNOSIS — N2581 Secondary hyperparathyroidism of renal origin: Secondary | ICD-10-CM | POA: Diagnosis not present

## 2015-03-13 DIAGNOSIS — D500 Iron deficiency anemia secondary to blood loss (chronic): Secondary | ICD-10-CM | POA: Diagnosis not present

## 2015-03-13 DIAGNOSIS — D689 Coagulation defect, unspecified: Secondary | ICD-10-CM | POA: Diagnosis not present

## 2015-03-13 DIAGNOSIS — N186 End stage renal disease: Secondary | ICD-10-CM | POA: Diagnosis not present

## 2015-03-16 ENCOUNTER — Encounter: Payer: Self-pay | Admitting: Family Medicine

## 2015-03-16 DIAGNOSIS — D689 Coagulation defect, unspecified: Secondary | ICD-10-CM | POA: Diagnosis not present

## 2015-03-16 DIAGNOSIS — S22009A Unspecified fracture of unspecified thoracic vertebra, initial encounter for closed fracture: Secondary | ICD-10-CM | POA: Insufficient documentation

## 2015-03-16 DIAGNOSIS — D500 Iron deficiency anemia secondary to blood loss (chronic): Secondary | ICD-10-CM | POA: Diagnosis not present

## 2015-03-16 DIAGNOSIS — N186 End stage renal disease: Secondary | ICD-10-CM | POA: Diagnosis not present

## 2015-03-16 DIAGNOSIS — N2581 Secondary hyperparathyroidism of renal origin: Secondary | ICD-10-CM | POA: Diagnosis not present

## 2015-03-17 ENCOUNTER — Encounter: Payer: Self-pay | Admitting: Vascular Surgery

## 2015-03-17 DIAGNOSIS — I120 Hypertensive chronic kidney disease with stage 5 chronic kidney disease or end stage renal disease: Secondary | ICD-10-CM | POA: Diagnosis not present

## 2015-03-17 DIAGNOSIS — N186 End stage renal disease: Secondary | ICD-10-CM | POA: Diagnosis not present

## 2015-03-17 DIAGNOSIS — Z992 Dependence on renal dialysis: Secondary | ICD-10-CM | POA: Diagnosis not present

## 2015-03-18 DIAGNOSIS — N186 End stage renal disease: Secondary | ICD-10-CM | POA: Diagnosis not present

## 2015-03-18 DIAGNOSIS — N2581 Secondary hyperparathyroidism of renal origin: Secondary | ICD-10-CM | POA: Diagnosis not present

## 2015-03-20 DIAGNOSIS — N2581 Secondary hyperparathyroidism of renal origin: Secondary | ICD-10-CM | POA: Diagnosis not present

## 2015-03-20 DIAGNOSIS — N186 End stage renal disease: Secondary | ICD-10-CM | POA: Diagnosis not present

## 2015-03-23 DIAGNOSIS — N186 End stage renal disease: Secondary | ICD-10-CM | POA: Diagnosis not present

## 2015-03-23 DIAGNOSIS — N2581 Secondary hyperparathyroidism of renal origin: Secondary | ICD-10-CM | POA: Diagnosis not present

## 2015-03-24 ENCOUNTER — Ambulatory Visit (HOSPITAL_COMMUNITY)
Admission: RE | Admit: 2015-03-24 | Discharge: 2015-03-24 | Disposition: A | Discharge status: Home / Self Care | Payer: Medicare Other | Source: Ambulatory Visit | Attending: Vascular Surgery | Admitting: Vascular Surgery

## 2015-03-24 ENCOUNTER — Ambulatory Visit (INDEPENDENT_AMBULATORY_CARE_PROVIDER_SITE_OTHER): Payer: Medicare Other | Admitting: Vascular Surgery

## 2015-03-24 ENCOUNTER — Encounter: Payer: Self-pay | Admitting: Vascular Surgery

## 2015-03-24 VITALS — BP 136/101 | HR 97 | Temp 97.5°F | Resp 18 | Ht 66.0 in | Wt 250.0 lb

## 2015-03-24 DIAGNOSIS — N186 End stage renal disease: Secondary | ICD-10-CM | POA: Insufficient documentation

## 2015-03-24 DIAGNOSIS — T82855A Stenosis of coronary artery stent, initial encounter: Secondary | ICD-10-CM | POA: Insufficient documentation

## 2015-03-24 DIAGNOSIS — Z01810 Encounter for preprocedural cardiovascular examination: Secondary | ICD-10-CM | POA: Insufficient documentation

## 2015-03-24 DIAGNOSIS — Y838 Other surgical procedures as the cause of abnormal reaction of the patient, or of later complication, without mention of misadventure at the time of the procedure: Secondary | ICD-10-CM | POA: Diagnosis not present

## 2015-03-24 DIAGNOSIS — Z992 Dependence on renal dialysis: Secondary | ICD-10-CM

## 2015-03-24 NOTE — Progress Notes (Signed)
Subjective:     Patient ID: Perry [name], male   DOB: Apr 22, 1970, 45 y.o.   MRN: [id number] HPI  This 45 year old male was referred for evaluation for vascular access. He has had multiple procedures performed at [facility] but has never had any procedures performed by our practice. He has had failed bilateral upper extremity fistulas including a left bruit brachiobasilic fistula recently. Has had a stent in his right subclavian vein which is now occluded. He currently has a catheter in his left IJ.  Past Medical History  Diagnosis Date  . Dialysis patient (HCC)   . Sleep apnea     cpap-   . Anxiety   . Kidney failure     [facility] since [facility] nephrolog[facility]   . GERD (gastroesophageal reflux disease)   . High blood pressure     hx of   . Acute meniscal tear of left knee   . Acute meniscal tear of right knee   . Chronic back pain     due to weight   . Hemodialysis patient [facility](HCC)     since 2011, does hemodialysis at home, wife does she is a [employer] boy[employer] , Daily except wed and sun  . Seizures (HCC)   . Fracture of transverse process of thoracic vertebra (HCC)     [facility] by [facility]    Social History  Substance Use Topics  . Smoking status: Current Every Day Smoker -- 0.50 packs/day for 20 years    Types: Cigarettes    Last Attempt to Quit: 06/17/2013  . Smokeless tobacco: Never Used  . Alcohol Use: No    Family History  Problem Relation Age of Onset  . High blood pressure Mother     Allergies  Allergen Reactions  . Depakote [Divalproex Sodium] Other (See Comments)    Hallucinations       Filed Vitals:   03/24/15 1136  BP: 136/101  Pulse: 97  Temp: 97.5 F (36.4 C)  Resp: 18  Height: 5\' 6"  (1.676 m)  Weight: 250 lb (113.399 kg)  SpO2: 97%    Body mass index is 40.37 kg/(m^2).          Review of Systems has history of gastroesophageal reflux disease, knee surgery, , sleep apnea     Objective:   Physical Exam BP  136/101 mmHg  Pulse 97  Temp(Src) 97.5 F (36.4 C)  Resp 18  Ht 5\' 6"  (1.676 m)  Wt 250 lb (113.399 kg)  BMI 40.37 kg/m2  SpO2 97%  General alert and oriented 3 in no apparent distress Lungs no rhonchi or wheezing Cardiovascular regular rhythm no murmurs Right upper extremity with 3+ brachial pulse palpable. 1+ edema. Left leg with 2-3+ brachial pulse palpable. Well-healed incisions.  Today I reviewed by computer some of the procedures performed at [facility] and also ordered a venous duplex exam of the right arm to determine if the basilic vein was patent. Ultrasound revealed a tortuous right basilic vein but occlusion of the stent in the subclavian vein.     Assessment:     End-stage renal disease with multiple failed access and current occlusion of right central vein-subclavian    Plan:     We'll obtain left arm venogram to see if patient is candidate for a left upper arm brachial to axillary AV graft Schedule this for Thursday, March 16 by Dr. Imogene Burnhen If upper arm graft insertion is not feasible then he will need a thigh graft

## 2015-03-25 DIAGNOSIS — N2581 Secondary hyperparathyroidism of renal origin: Secondary | ICD-10-CM | POA: Diagnosis not present

## 2015-03-25 DIAGNOSIS — N186 End stage renal disease: Secondary | ICD-10-CM | POA: Diagnosis not present

## 2015-03-27 ENCOUNTER — Encounter: Payer: Self-pay | Admitting: Family Medicine

## 2015-03-27 DIAGNOSIS — N186 End stage renal disease: Secondary | ICD-10-CM | POA: Diagnosis not present

## 2015-03-27 DIAGNOSIS — N2581 Secondary hyperparathyroidism of renal origin: Secondary | ICD-10-CM | POA: Diagnosis not present

## 2015-03-30 ENCOUNTER — Other Ambulatory Visit: Payer: Self-pay | Admitting: Family Medicine

## 2015-03-30 DIAGNOSIS — N186 End stage renal disease: Secondary | ICD-10-CM | POA: Diagnosis not present

## 2015-03-30 DIAGNOSIS — N2581 Secondary hyperparathyroidism of renal origin: Secondary | ICD-10-CM | POA: Diagnosis not present

## 2015-03-31 ENCOUNTER — Other Ambulatory Visit: Payer: Self-pay

## 2015-03-31 MED ORDER — OXYCODONE HCL 10 MG PO TABS: 10.0000 mg | ORAL_TABLET | ORAL | Status: DC | PRN

## 2015-03-31 NOTE — Telephone Encounter (Signed)
RX printed, left up front and patient aware to pick up on Thursday via mychart

## 2015-04-01 ENCOUNTER — Telehealth: Payer: Self-pay

## 2015-04-01 ENCOUNTER — Other Ambulatory Visit: Payer: Self-pay

## 2015-04-01 DIAGNOSIS — Z01810 Encounter for preprocedural cardiovascular examination: Secondary | ICD-10-CM

## 2015-04-01 DIAGNOSIS — N186 End stage renal disease: Secondary | ICD-10-CM

## 2015-04-01 DIAGNOSIS — Z992 Dependence on renal dialysis: Principal | ICD-10-CM

## 2015-04-01 DIAGNOSIS — N2581 Secondary hyperparathyroidism of renal origin: Secondary | ICD-10-CM | POA: Diagnosis not present

## 2015-04-01 NOTE — Telephone Encounter (Signed)
Spoke with pts wife to schedule appt for 03/16 @ 3:00pm for ABI, dpm

## 2015-04-01 NOTE — Telephone Encounter (Signed)
Rec'd phone call from pt. and his wife.  Reported that the pt. does not want to go through with the Venogram, scheduled for 04/02/15.  The pt. verbalized that he has had multiple procedures done on his arms, and will NOT allow any further surgeries to be done to his arms.  Stated because of this, he will not go through with the Venogram on 3/16.  Stated he will only agree to thigh graft procedure.  Discussed with Dr. Kellie Simmering.  Recommended to question pt. if he has had any surgeries on his legs, and if not, to schedule for pre-op ABI's.  If ABI's are nl., can proceed with scheduling Insertion Left Thigh AVG on 04/10/15.  Phone call ret'd to pt. and wife.  Informed of Dr. Evelena Leyden plan.  Wife stated pt. has not had any surgeries done on his legs.  Pt. and wife agree to plan.  Advised that a Scheduler will call back to schedule ABI's at our office, and then to plan for the surgery on 04/10/15.  Agreed.

## 2015-04-02 ENCOUNTER — Encounter (HOSPITAL_COMMUNITY): Admission: RE | Payer: Self-pay | Source: Ambulatory Visit

## 2015-04-02 ENCOUNTER — Ambulatory Visit (HOSPITAL_COMMUNITY)
Admission: RE | Admit: 2015-04-02 | Discharge: 2015-04-02 | Disposition: A | Discharge status: Home / Self Care | Payer: Medicare Other | Source: Ambulatory Visit | Attending: Vascular Surgery | Admitting: Vascular Surgery

## 2015-04-02 ENCOUNTER — Ambulatory Visit (HOSPITAL_COMMUNITY): Admission: RE | Admit: 2015-04-02 | Payer: Medicare Other | Source: Ambulatory Visit | Admitting: Vascular Surgery

## 2015-04-02 DIAGNOSIS — Z992 Dependence on renal dialysis: Secondary | ICD-10-CM | POA: Diagnosis not present

## 2015-04-02 DIAGNOSIS — Z01810 Encounter for preprocedural cardiovascular examination: Secondary | ICD-10-CM | POA: Diagnosis not present

## 2015-04-02 DIAGNOSIS — N186 End stage renal disease: Secondary | ICD-10-CM | POA: Diagnosis not present

## 2015-04-02 SURGERY — A/V SHUNTOGRAM/FISTULAGRAM

## 2015-04-03 ENCOUNTER — Other Ambulatory Visit: Payer: Self-pay | Admitting: Family Medicine

## 2015-04-03 DIAGNOSIS — N186 End stage renal disease: Secondary | ICD-10-CM | POA: Diagnosis not present

## 2015-04-03 DIAGNOSIS — N2581 Secondary hyperparathyroidism of renal origin: Secondary | ICD-10-CM | POA: Diagnosis not present

## 2015-04-03 MED ORDER — CYCLOBENZAPRINE HCL 10 MG PO TABS: 10.0000 mg | ORAL_TABLET | Freq: Three times a day (TID) | ORAL | Status: DC

## 2015-04-03 NOTE — Telephone Encounter (Signed)
rx called in pt made aware  

## 2015-04-03 NOTE — Telephone Encounter (Signed)
ok 

## 2015-04-03 NOTE — Telephone Encounter (Signed)
LRF 03/09/15 #90   LOV 03/05/15  OK refill?

## 2015-04-06 DIAGNOSIS — N2581 Secondary hyperparathyroidism of renal origin: Secondary | ICD-10-CM | POA: Diagnosis not present

## 2015-04-06 DIAGNOSIS — N186 End stage renal disease: Secondary | ICD-10-CM | POA: Diagnosis not present

## 2015-04-08 DIAGNOSIS — N186 End stage renal disease: Secondary | ICD-10-CM | POA: Diagnosis not present

## 2015-04-08 DIAGNOSIS — N2581 Secondary hyperparathyroidism of renal origin: Secondary | ICD-10-CM | POA: Diagnosis not present

## 2015-04-10 DIAGNOSIS — N2581 Secondary hyperparathyroidism of renal origin: Secondary | ICD-10-CM | POA: Diagnosis not present

## 2015-04-10 DIAGNOSIS — N186 End stage renal disease: Secondary | ICD-10-CM | POA: Diagnosis not present

## 2015-04-13 ENCOUNTER — Other Ambulatory Visit: Payer: Self-pay

## 2015-04-13 DIAGNOSIS — N186 End stage renal disease: Secondary | ICD-10-CM | POA: Diagnosis not present

## 2015-04-13 DIAGNOSIS — N2581 Secondary hyperparathyroidism of renal origin: Secondary | ICD-10-CM | POA: Diagnosis not present

## 2015-04-15 ENCOUNTER — Encounter: Payer: Self-pay | Admitting: Family Medicine

## 2015-04-15 DIAGNOSIS — N2581 Secondary hyperparathyroidism of renal origin: Secondary | ICD-10-CM | POA: Diagnosis not present

## 2015-04-15 DIAGNOSIS — N186 End stage renal disease: Secondary | ICD-10-CM | POA: Diagnosis not present

## 2015-04-16 ENCOUNTER — Other Ambulatory Visit: Payer: Self-pay | Admitting: Family Medicine

## 2015-04-16 DIAGNOSIS — F411 Generalized anxiety disorder: Secondary | ICD-10-CM

## 2015-04-16 MED ORDER — ESCITALOPRAM OXALATE 20 MG PO TABS: 20.0000 mg | ORAL_TABLET | Freq: Every day | ORAL | Status: DC

## 2015-04-17 ENCOUNTER — Inpatient Hospital Stay (HOSPITAL_COMMUNITY): Admission: RE | Admit: 2015-04-17 | Payer: Medicare Other | Source: Ambulatory Visit | Admitting: Vascular Surgery

## 2015-04-17 ENCOUNTER — Encounter (HOSPITAL_COMMUNITY): Admission: RE | Payer: Self-pay | Source: Ambulatory Visit

## 2015-04-17 DIAGNOSIS — I120 Hypertensive chronic kidney disease with stage 5 chronic kidney disease or end stage renal disease: Secondary | ICD-10-CM | POA: Diagnosis not present

## 2015-04-17 DIAGNOSIS — N186 End stage renal disease: Secondary | ICD-10-CM | POA: Diagnosis not present

## 2015-04-17 DIAGNOSIS — N2581 Secondary hyperparathyroidism of renal origin: Secondary | ICD-10-CM | POA: Diagnosis not present

## 2015-04-17 DIAGNOSIS — Z992 Dependence on renal dialysis: Secondary | ICD-10-CM | POA: Diagnosis not present

## 2015-04-17 SURGERY — INSERTION OF ARTERIOVENOUS (AV) GORE-TEX GRAFT THIGH
Anesthesia: General | Site: Thigh | Laterality: Left

## 2015-04-20 DIAGNOSIS — N2581 Secondary hyperparathyroidism of renal origin: Secondary | ICD-10-CM | POA: Diagnosis not present

## 2015-04-20 DIAGNOSIS — N186 End stage renal disease: Secondary | ICD-10-CM | POA: Diagnosis not present

## 2015-04-22 DIAGNOSIS — N2581 Secondary hyperparathyroidism of renal origin: Secondary | ICD-10-CM | POA: Diagnosis not present

## 2015-04-22 DIAGNOSIS — N186 End stage renal disease: Secondary | ICD-10-CM | POA: Diagnosis not present

## 2015-04-24 DIAGNOSIS — N186 End stage renal disease: Secondary | ICD-10-CM | POA: Diagnosis not present

## 2015-04-24 DIAGNOSIS — N2581 Secondary hyperparathyroidism of renal origin: Secondary | ICD-10-CM | POA: Diagnosis not present

## 2015-04-27 DIAGNOSIS — N2581 Secondary hyperparathyroidism of renal origin: Secondary | ICD-10-CM | POA: Diagnosis not present

## 2015-04-27 DIAGNOSIS — N186 End stage renal disease: Secondary | ICD-10-CM | POA: Diagnosis not present

## 2015-04-28 ENCOUNTER — Other Ambulatory Visit: Payer: Self-pay | Admitting: Family Medicine

## 2015-04-29 DIAGNOSIS — N2581 Secondary hyperparathyroidism of renal origin: Secondary | ICD-10-CM | POA: Diagnosis not present

## 2015-04-29 DIAGNOSIS — N186 End stage renal disease: Secondary | ICD-10-CM | POA: Diagnosis not present

## 2015-04-30 ENCOUNTER — Other Ambulatory Visit: Payer: Self-pay | Admitting: Family Medicine

## 2015-04-30 MED ORDER — ALPRAZOLAM 2 MG PO TABS: ORAL_TABLET | ORAL | Status: DC

## 2015-04-30 MED ORDER — OXYCODONE HCL 10 MG PO TABS: 10.0000 mg | ORAL_TABLET | ORAL | Status: DC | PRN

## 2015-04-30 MED ORDER — CYCLOBENZAPRINE HCL 10 MG PO TABS: 10.0000 mg | ORAL_TABLET | Freq: Three times a day (TID) | ORAL | Status: DC

## 2015-05-01 DIAGNOSIS — N2581 Secondary hyperparathyroidism of renal origin: Secondary | ICD-10-CM | POA: Diagnosis not present

## 2015-05-01 DIAGNOSIS — N186 End stage renal disease: Secondary | ICD-10-CM | POA: Diagnosis not present

## 2015-05-06 ENCOUNTER — Inpatient Hospital Stay (HOSPITAL_COMMUNITY)
Admission: EM | Admit: 2015-05-06 | Discharge: 2015-05-08 | DRG: 291 | Disposition: A | Discharge status: Home / Self Care | Payer: Medicare Other | Attending: Internal Medicine | Admitting: Internal Medicine

## 2015-05-06 ENCOUNTER — Encounter (HOSPITAL_COMMUNITY): Payer: Self-pay | Admitting: Emergency Medicine

## 2015-05-06 ENCOUNTER — Emergency Department (HOSPITAL_COMMUNITY): Payer: Medicare Other

## 2015-05-06 ENCOUNTER — Inpatient Hospital Stay (HOSPITAL_COMMUNITY): Payer: Medicare Other

## 2015-05-06 DIAGNOSIS — K219 Gastro-esophageal reflux disease without esophagitis: Secondary | ICD-10-CM | POA: Diagnosis present

## 2015-05-06 DIAGNOSIS — G40909 Epilepsy, unspecified, not intractable, without status epilepticus: Secondary | ICD-10-CM | POA: Diagnosis present

## 2015-05-06 DIAGNOSIS — S50821A Blister (nonthermal) of right forearm, initial encounter: Secondary | ICD-10-CM | POA: Diagnosis not present

## 2015-05-06 DIAGNOSIS — M549 Dorsalgia, unspecified: Secondary | ICD-10-CM | POA: Diagnosis present

## 2015-05-06 DIAGNOSIS — N186 End stage renal disease: Secondary | ICD-10-CM | POA: Diagnosis not present

## 2015-05-06 DIAGNOSIS — F411 Generalized anxiety disorder: Secondary | ICD-10-CM | POA: Diagnosis present

## 2015-05-06 DIAGNOSIS — E875 Hyperkalemia: Secondary | ICD-10-CM | POA: Diagnosis not present

## 2015-05-06 DIAGNOSIS — D631 Anemia in chronic kidney disease: Secondary | ICD-10-CM | POA: Diagnosis present

## 2015-05-06 DIAGNOSIS — G4733 Obstructive sleep apnea (adult) (pediatric): Secondary | ICD-10-CM | POA: Diagnosis present

## 2015-05-06 DIAGNOSIS — Z7901 Long term (current) use of anticoagulants: Secondary | ICD-10-CM

## 2015-05-06 DIAGNOSIS — I5033 Acute on chronic diastolic (congestive) heart failure: Secondary | ICD-10-CM | POA: Diagnosis not present

## 2015-05-06 DIAGNOSIS — N2581 Secondary hyperparathyroidism of renal origin: Secondary | ICD-10-CM | POA: Diagnosis not present

## 2015-05-06 DIAGNOSIS — M899 Disorder of bone, unspecified: Secondary | ICD-10-CM | POA: Diagnosis present

## 2015-05-06 DIAGNOSIS — Z992 Dependence on renal dialysis: Secondary | ICD-10-CM

## 2015-05-06 DIAGNOSIS — I132 Hypertensive heart and chronic kidney disease with heart failure and with stage 5 chronic kidney disease, or end stage renal disease: Secondary | ICD-10-CM | POA: Diagnosis present

## 2015-05-06 DIAGNOSIS — G92 Toxic encephalopathy: Secondary | ICD-10-CM | POA: Diagnosis present

## 2015-05-06 DIAGNOSIS — Z79891 Long term (current) use of opiate analgesic: Secondary | ICD-10-CM | POA: Diagnosis not present

## 2015-05-06 DIAGNOSIS — T40605A Adverse effect of unspecified narcotics, initial encounter: Secondary | ICD-10-CM | POA: Diagnosis present

## 2015-05-06 DIAGNOSIS — Z79899 Other long term (current) drug therapy: Secondary | ICD-10-CM | POA: Diagnosis not present

## 2015-05-06 DIAGNOSIS — I509 Heart failure, unspecified: Secondary | ICD-10-CM

## 2015-05-06 DIAGNOSIS — Z9884 Bariatric surgery status: Secondary | ICD-10-CM

## 2015-05-06 DIAGNOSIS — D638 Anemia in other chronic diseases classified elsewhere: Secondary | ICD-10-CM | POA: Diagnosis not present

## 2015-05-06 DIAGNOSIS — R0600 Dyspnea, unspecified: Secondary | ICD-10-CM | POA: Diagnosis not present

## 2015-05-06 DIAGNOSIS — Z7982 Long term (current) use of aspirin: Secondary | ICD-10-CM

## 2015-05-06 DIAGNOSIS — E785 Hyperlipidemia, unspecified: Secondary | ICD-10-CM | POA: Diagnosis present

## 2015-05-06 DIAGNOSIS — Z9989 Dependence on other enabling machines and devices: Secondary | ICD-10-CM

## 2015-05-06 DIAGNOSIS — F17210 Nicotine dependence, cigarettes, uncomplicated: Secondary | ICD-10-CM | POA: Diagnosis present

## 2015-05-06 DIAGNOSIS — R911 Solitary pulmonary nodule: Secondary | ICD-10-CM | POA: Diagnosis present

## 2015-05-06 DIAGNOSIS — G8929 Other chronic pain: Secondary | ICD-10-CM | POA: Diagnosis present

## 2015-05-06 DIAGNOSIS — I10 Essential (primary) hypertension: Secondary | ICD-10-CM | POA: Diagnosis present

## 2015-05-06 DIAGNOSIS — R000 Tachycardia, unspecified: Secondary | ICD-10-CM | POA: Diagnosis not present

## 2015-05-06 DIAGNOSIS — R918 Other nonspecific abnormal finding of lung field: Secondary | ICD-10-CM | POA: Diagnosis not present

## 2015-05-06 DIAGNOSIS — Z6841 Body Mass Index (BMI) 40.0 and over, adult: Secondary | ICD-10-CM | POA: Diagnosis not present

## 2015-05-06 DIAGNOSIS — E872 Acidosis: Secondary | ICD-10-CM | POA: Diagnosis present

## 2015-05-06 DIAGNOSIS — J810 Acute pulmonary edema: Secondary | ICD-10-CM | POA: Diagnosis not present

## 2015-05-06 DIAGNOSIS — G9340 Encephalopathy, unspecified: Secondary | ICD-10-CM | POA: Diagnosis not present

## 2015-05-06 DIAGNOSIS — J189 Pneumonia, unspecified organism: Secondary | ICD-10-CM

## 2015-05-06 DIAGNOSIS — X58XXXA Exposure to other specified factors, initial encounter: Secondary | ICD-10-CM | POA: Diagnosis not present

## 2015-05-06 DIAGNOSIS — R55 Syncope and collapse: Secondary | ICD-10-CM | POA: Diagnosis not present

## 2015-05-06 DIAGNOSIS — R4182 Altered mental status, unspecified: Secondary | ICD-10-CM | POA: Diagnosis not present

## 2015-05-06 DIAGNOSIS — R569 Unspecified convulsions: Secondary | ICD-10-CM

## 2015-05-06 LAB — BLOOD GAS, ARTERIAL
ACID-BASE DEFICIT: 0.5 mmol/L (ref 0.0–2.0)
ACID-BASE EXCESS: 2 mmol/L (ref 0.0–2.0)
Acid-Base Excess: 2.5 mmol/L — ABNORMAL HIGH (ref 0.0–2.0)
BICARBONATE: 25.8 meq/L — AB (ref 20.0–24.0)
Bicarbonate: 21.5 mEq/L (ref 20.0–24.0)
Bicarbonate: 24.2 mEq/L — ABNORMAL HIGH (ref 20.0–24.0)
Delivery systems: POSITIVE
Delivery systems: POSITIVE
EXPIRATORY PAP: 7
Expiratory PAP: 8
FIO2: 40
FIO2: 30
INSPIRATORY PAP: 16
Inspiratory PAP: 20
O2 Content: 3 L/min
O2 SAT: 89.7 %
O2 SAT: 97.8 %
O2 Saturation: 92.7 %
PATIENT TEMPERATURE: 37
PCO2 ART: 45.3 mmHg — AB (ref 35.0–45.0)
PO2 ART: 120 mmHg — AB (ref 80.0–100.0)
pCO2 arterial: 71.9 mmHg (ref 35.0–45.0)
pCO2 arterial: 71.4 mmHg (ref 35.0–45.0)
pH, Arterial: 7.196 — CL (ref 7.350–7.450)
pH, Arterial: 7.237 — ABNORMAL LOW (ref 7.350–7.450)
pH, Arterial: 7.386 (ref 7.350–7.450)
pO2, Arterial: 83.7 mmHg (ref 80.0–100.0)
pO2, Arterial: 85.2 mmHg (ref 80.0–100.0)

## 2015-05-06 LAB — CBC WITH DIFFERENTIAL/PLATELET
BASOS ABS: 0 10*3/uL (ref 0.0–0.1)
BASOS PCT: 0 %
EOS ABS: 0 10*3/uL (ref 0.0–0.7)
Eosinophils Relative: 0 %
HCT: 48.6 % (ref 39.0–52.0)
HEMOGLOBIN: 15 g/dL (ref 13.0–17.0)
Lymphocytes Relative: 4 %
Lymphs Abs: 0.4 10*3/uL — ABNORMAL LOW (ref 0.7–4.0)
MCH: 29.8 pg (ref 26.0–34.0)
MCHC: 30.9 g/dL (ref 30.0–36.0)
MCV: 96.6 fL (ref 78.0–100.0)
Monocytes Absolute: 0.8 10*3/uL (ref 0.1–1.0)
Monocytes Relative: 7 %
NEUTROS ABS: 9.8 10*3/uL — AB (ref 1.7–7.7)
NEUTROS PCT: 89 %
PLATELETS: ADEQUATE 10*3/uL (ref 150–400)
RBC: 5.03 MIL/uL (ref 4.22–5.81)
RDW: 16.2 % — ABNORMAL HIGH (ref 11.5–15.5)
SMEAR REVIEW: ADEQUATE
WBC: 11 10*3/uL — AB (ref 4.0–10.5)

## 2015-05-06 LAB — BASIC METABOLIC PANEL
Anion gap: 17 — ABNORMAL HIGH (ref 5–15)
Anion gap: 15 (ref 5–15)
BUN: 87 mg/dL — AB (ref 6–20)
BUN: 39 mg/dL — AB (ref 6–20)
CALCIUM: 8.2 mg/dL — AB (ref 8.9–10.3)
CHLORIDE: 99 mmol/L — AB (ref 101–111)
CHLORIDE: 97 mmol/L — AB (ref 101–111)
CO2: 21 mmol/L — ABNORMAL LOW (ref 22–32)
CO2: 27 mmol/L (ref 22–32)
CREATININE: 18.25 mg/dL — AB (ref 0.61–1.24)
CREATININE: 10.93 mg/dL — AB (ref 0.61–1.24)
Calcium: 7.2 mg/dL — ABNORMAL LOW (ref 8.9–10.3)
GFR calc Af Amer: 6 mL/min — ABNORMAL LOW (ref >60)
GFR, EST AFRICAN AMERICAN: 3 mL/min — AB (ref >60)
GFR, EST NON AFRICAN AMERICAN: 3 mL/min — AB (ref >60)
GFR, EST NON AFRICAN AMERICAN: 5 mL/min — AB (ref >60)
Glucose, Bld: 109 mg/dL — ABNORMAL HIGH (ref 65–99)
Glucose, Bld: 109 mg/dL — ABNORMAL HIGH (ref 65–99)
POTASSIUM: 6.9 mmol/L — AB (ref 3.5–5.1)
Potassium: 4.6 mmol/L (ref 3.5–5.1)
SODIUM: 137 mmol/L (ref 135–145)
SODIUM: 139 mmol/L (ref 135–145)

## 2015-05-06 LAB — MRSA PCR SCREENING: MRSA by PCR: NEGATIVE

## 2015-05-06 LAB — GLUCOSE, CAPILLARY: Glucose-Capillary: 103 mg/dL — ABNORMAL HIGH (ref 65–99)

## 2015-05-06 LAB — BRAIN NATRIURETIC PEPTIDE: B NATRIURETIC PEPTIDE 5: 129 pg/mL — AB (ref 0.0–100.0)

## 2015-05-06 LAB — I-STAT CG4 LACTIC ACID, ED: LACTIC ACID, VENOUS: 1.71 mmol/L (ref 0.5–2.0)

## 2015-05-06 LAB — TROPONIN I

## 2015-05-06 MED ORDER — INSULIN ASPART 100 UNIT/ML ~~LOC~~ SOLN
SUBCUTANEOUS | Status: AC
  Administered 2015-05-06: 10 [IU] via INTRAVENOUS
  Filled 2015-05-06: qty 1

## 2015-05-06 MED ORDER — NALOXONE HCL 2 MG/2ML IJ SOSY
2.0000 mg | PREFILLED_SYRINGE | Freq: Once | INTRAMUSCULAR | Status: AC
  Administered 2015-05-06: 2 mg via INTRAVENOUS

## 2015-05-06 MED ORDER — NALOXONE HCL 0.4 MG/ML IJ SOLN: 0.4000 mg | INTRAMUSCULAR | Status: DC | PRN

## 2015-05-06 MED ORDER — CALCIUM GLUCONATE 10 % IV SOLN
1.0000 g | Freq: Once | INTRAVENOUS | Status: AC
  Administered 2015-05-06: 1 g via INTRAVENOUS
  Filled 2015-05-06: qty 10

## 2015-05-06 MED ORDER — NALOXONE HCL 2 MG/2ML IJ SOSY
1.0000 mg/h | PREFILLED_SYRINGE | INTRAVENOUS | Status: DC
  Administered 2015-05-06: 2 mg/h via INTRAVENOUS
  Administered 2015-05-06 – 2015-05-07 (×4): 1 mg/h via INTRAVENOUS
  Filled 2015-05-06 (×2): qty 4

## 2015-05-06 MED ORDER — NALOXONE HCL 2 MG/2ML IJ SOSY
1.0000 mg | PREFILLED_SYRINGE | Freq: Once | INTRAMUSCULAR | Status: AC
  Administered 2015-05-06: 1 mg via INTRAVENOUS
  Filled 2015-05-06: qty 2

## 2015-05-06 MED ORDER — INSULIN ASPART 100 UNIT/ML IV SOLN
10.0000 [IU] | Freq: Once | INTRAVENOUS | Status: AC
  Administered 2015-05-06: 10 [IU] via INTRAVENOUS

## 2015-05-06 MED ORDER — NALOXONE HCL 0.4 MG/ML IJ SOLN
0.4000 mg | INTRAMUSCULAR | Status: DC | PRN
  Administered 2015-05-06: 0.4 mg via INTRAVENOUS
  Filled 2015-05-06: qty 1

## 2015-05-06 MED ORDER — DEXTROSE 50 % IV SOLN
INTRAVENOUS | Status: AC
  Administered 2015-05-06: 50 mL via INTRAVENOUS
  Filled 2015-05-06: qty 50

## 2015-05-06 MED ORDER — DEXTROSE 5 % IV SOLN
2.0000 g | INTRAVENOUS | Status: DC
  Administered 2015-05-06: 2 g via INTRAVENOUS
  Filled 2015-05-06 (×2): qty 2

## 2015-05-06 MED ORDER — NALOXONE HCL 2 MG/2ML IJ SOSY
PREFILLED_SYRINGE | INTRAMUSCULAR | Status: AC
  Filled 2015-05-06: qty 4

## 2015-05-06 MED ORDER — SODIUM CHLORIDE 0.9% FLUSH
10.0000 mL | INTRAVENOUS | Status: DC | PRN
  Administered 2015-05-06: 10 mL
  Filled 2015-05-06: qty 10

## 2015-05-06 MED ORDER — ASPIRIN EC 81 MG PO TBEC
81.0000 mg | DELAYED_RELEASE_TABLET | Freq: Every day | ORAL | Status: DC
  Administered 2015-05-06 – 2015-05-07 (×2): 81 mg via ORAL
  Filled 2015-05-06 (×5): qty 1

## 2015-05-06 MED ORDER — APIXABAN 5 MG PO TABS
2.5000 mg | ORAL_TABLET | Freq: Two times a day (BID) | ORAL | Status: DC
  Administered 2015-05-06 – 2015-05-07 (×4): 2.5 mg via ORAL
  Filled 2015-05-06 (×4): qty 1

## 2015-05-06 MED ORDER — HEPARIN SODIUM (PORCINE) 1000 UNIT/ML DIALYSIS: 1000.0000 [IU] | INTRAMUSCULAR | Status: DC | PRN

## 2015-05-06 MED ORDER — SEVELAMER CARBONATE 800 MG PO TABS
3200.0000 mg | ORAL_TABLET | Freq: Three times a day (TID) | ORAL | Status: DC
  Administered 2015-05-07 – 2015-05-08 (×4): 3200 mg via ORAL
  Filled 2015-05-06 (×5): qty 4

## 2015-05-06 MED ORDER — ALBUTEROL SULFATE (2.5 MG/3ML) 0.083% IN NEBU
10.0000 mg | INHALATION_SOLUTION | Freq: Once | RESPIRATORY_TRACT | Status: AC
  Administered 2015-05-06: 10 mg via RESPIRATORY_TRACT
  Filled 2015-05-06: qty 12

## 2015-05-06 MED ORDER — ONDANSETRON HCL 4 MG/2ML IJ SOLN
INTRAMUSCULAR | Status: AC
  Filled 2015-05-06: qty 2

## 2015-05-06 MED ORDER — HEPARIN SODIUM (PORCINE) 1000 UNIT/ML IJ SOLN
INTRAMUSCULAR | Status: AC
  Administered 2015-05-06: 2300 [IU] via INTRAVENOUS_CENTRAL
  Filled 2015-05-06: qty 7

## 2015-05-06 MED ORDER — SODIUM BICARBONATE 8.4 % IV SOLN
50.0000 meq | Freq: Once | INTRAVENOUS | Status: AC
Start: 2015-05-06 — End: 2015-05-06
  Administered 2015-05-06: 50 meq via INTRAVENOUS
  Filled 2015-05-06: qty 50

## 2015-05-06 MED ORDER — NALOXONE HCL 2 MG/2ML IJ SOSY
PREFILLED_SYRINGE | INTRAMUSCULAR | Status: AC
  Filled 2015-05-06: qty 2

## 2015-05-06 MED ORDER — DEXTROSE 50 % IV SOLN
1.0000 | Freq: Once | INTRAVENOUS | Status: AC
  Administered 2015-05-06: 50 mL via INTRAVENOUS

## 2015-05-06 MED ORDER — VANCOMYCIN HCL IN DEXTROSE 1-5 GM/200ML-% IV SOLN
1000.0000 mg | Freq: Once | INTRAVENOUS | Status: AC
  Administered 2015-05-06: 1000 mg via INTRAVENOUS
  Filled 2015-05-06: qty 200

## 2015-05-06 MED ORDER — CALCITRIOL 0.25 MCG PO CAPS
0.7500 ug | ORAL_CAPSULE | Freq: Three times a day (TID) | ORAL | Status: DC
  Administered 2015-05-06 – 2015-05-08 (×6): 0.75 ug via ORAL
  Filled 2015-05-06 (×6): qty 3

## 2015-05-06 MED ORDER — ALTEPLASE 2 MG IJ SOLR: 2.0000 mg | Freq: Once | INTRAMUSCULAR | Status: DC | PRN

## 2015-05-06 MED ORDER — CEFEPIME HCL 1 G IJ SOLR
1.0000 g | Freq: Three times a day (TID) | INTRAMUSCULAR | Status: DC
  Filled 2015-05-06 (×4): qty 1

## 2015-05-06 MED ORDER — SODIUM CHLORIDE 0.9 % IV SOLN: 100.0000 mL | INTRAVENOUS | Status: DC | PRN

## 2015-05-06 MED ORDER — ESCITALOPRAM OXALATE 10 MG PO TABS
20.0000 mg | ORAL_TABLET | Freq: Every day | ORAL | Status: DC
  Administered 2015-05-06 – 2015-05-07 (×2): 20 mg via ORAL
  Filled 2015-05-06 (×2): qty 2

## 2015-05-06 MED ORDER — PANTOPRAZOLE SODIUM 40 MG PO TBEC
40.0000 mg | DELAYED_RELEASE_TABLET | Freq: Every day | ORAL | Status: DC
  Administered 2015-05-06 – 2015-05-07 (×2): 40 mg via ORAL
  Filled 2015-05-06 (×5): qty 1

## 2015-05-06 MED ORDER — PIPERACILLIN-TAZOBACTAM 3.375 G IVPB 30 MIN
3.3750 g | Freq: Once | INTRAVENOUS | Status: AC
  Administered 2015-05-06: 3.375 g via INTRAVENOUS
  Filled 2015-05-06: qty 50

## 2015-05-06 MED ORDER — HEPARIN SODIUM (PORCINE) 1000 UNIT/ML DIALYSIS
20.0000 [IU]/kg | INTRAMUSCULAR | Status: DC | PRN
  Administered 2015-05-06: 2300 [IU] via INTRAVENOUS_CENTRAL

## 2015-05-06 MED ORDER — SODIUM CHLORIDE 0.9% FLUSH
INTRAVENOUS | Status: AC
  Administered 2015-05-06: 10 mL
  Filled 2015-05-06: qty 60

## 2015-05-06 MED ORDER — RENA-VITE PO TABS
1.0000 | ORAL_TABLET | Freq: Every day | ORAL | Status: DC
  Administered 2015-05-06 – 2015-05-07 (×2): 1 via ORAL
  Filled 2015-05-06 (×2): qty 1

## 2015-05-06 MED ORDER — ONDANSETRON HCL 4 MG/2ML IJ SOLN
4.0000 mg | Freq: Once | INTRAMUSCULAR | Status: AC
  Administered 2015-05-06: 4 mg via INTRAVENOUS

## 2015-05-06 MED ORDER — NALOXONE HCL 2 MG/2ML IJ SOSY
PREFILLED_SYRINGE | INTRAMUSCULAR | Status: AC
  Administered 2015-05-06: 2 mg via INTRAVENOUS
  Filled 2015-05-06: qty 2

## 2015-05-06 MED ORDER — LEVETIRACETAM ER 500 MG PO TB24
500.0000 mg | ORAL_TABLET | Freq: Every day | ORAL | Status: DC
  Administered 2015-05-06 – 2015-05-07 (×2): 500 mg via ORAL
  Filled 2015-05-06 (×6): qty 1

## 2015-05-06 MED ORDER — CINACALCET HCL 30 MG PO TABS
120.0000 mg | ORAL_TABLET | Freq: Every evening | ORAL | Status: DC
  Administered 2015-05-06 – 2015-05-07 (×2): 120 mg via ORAL
  Filled 2015-05-06 (×5): qty 4

## 2015-05-06 MED ORDER — VANCOMYCIN HCL IN DEXTROSE 1-5 GM/200ML-% IV SOLN
1000.0000 mg | INTRAVENOUS | Status: DC
  Administered 2015-05-06: 1000 mg via INTRAVENOUS
  Filled 2015-05-06: qty 200

## 2015-05-06 NOTE — Progress Notes (Signed)
ABG results has improved and are back to normal range.  Took patient off bipap and placed on 2 lpm NCAN.  Patient more alert and awake now, will place patient on CPAP tonight for sleep apnea.  Oxygen saturation 100%.  Will continue to monitor.
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
## 2015-05-06 NOTE — Progress Notes (Signed)
CRITICAL VALUE ALERT  Critical value received:  ABG's  Date of notification:  05/06/2015  Time of notification:  1420  Critical value read back:Yes  Nurse who received alert:  Alcario DroughtErica, RN  MD notified (1st page):  Dr. Kerry HoughMemon  Time of first page:  1422  MD notified (2nd page):  Time of second page:  Responding MD:  Dr. Kerry HoughMemon  Time MD responded:  1425

## 2015-05-06 NOTE — ED Notes (Signed)
CRITICAL VALUE ALERT  Critical value received:  Potassium 6.9  Date of notification:  05/06/15  Time of notification:  0735  Critical value read back:Yes.    Nurse who received alert:  Fransico HimMeagan Taequan Stockhausen, RN  MD notified (1st page):  Dr. Estell HarpinZammit  Time of first page:  0735  MD notified (2nd page):  Time of second page:  Responding MD:  Dr. Estell HarpinZammit  Time MD responded:  [phone]

## 2015-05-06 NOTE — Progress Notes (Signed)
Pharmacy Antibiotic Note  Perry [name] is a 45 y.o. male admitted on 05/06/2015 with pneumonia.  Pharmacy has been consulted for Vancomycin and Cefepime dosing. Patient is being dialyzed today.  Plan: Vancomycin 1gm IV after each HD(goal trough 15-4625mcg/ml) Cefepime 2gm IV after each HD session  Height: 5\' 5"  (165.1 cm) Weight: 253 lb 1.4 oz (114.8 kg) IBW/kg (Calculated) : 61.5 Adjusted body weight= 83kg  Temp (24hrs), Avg:98.3 F (36.8 C), Min:98.2 F (36.8 C), Max:98.4 F (36.9 C)   Recent Labs Lab 05/06/15 0634 05/06/15 0712 05/06/15 0846  WBC 11.0*  --   --   CREATININE  --  18.25*  --   LATICACIDVEN  --   --  1.71    Estimated Creatinine Clearance: 6.1 mL/min (by C-G formula based on Cr of 18.25).    Allergies  Allergen Reactions  . Depakote [Divalproex Sodium] Other (See Comments)    Hallucinations     Antimicrobials this admission: Vancomycin 4/19 >>  Cefepime 4/19 >>   Microbiology results: 4/19 BCx: pending 4/19 Sputum: TBC 4/19 MRSA PCR: pending  Thank you for allowing pharmacy to be a part of this patient's care. Perry [name], BS Loura Backharm D, [place] Clinical Pharmacist Pager [phone] 05/06/2015 11:36 AM

## 2015-05-06 NOTE — Procedures (Signed)
   HEMODIALYSIS TREATMENT NOTE:  4.5 hour low-heparin dialysis completed via left chest wall tunneled PC. Exit site unremarkable. Goal NOT met: BP unable to tolerate removal of 0.5 L as ordered.  Ultrafiltration was interrupted x 45 minutes due to hypotension.  Net UF=0.  Max Qb 300cc.  Will instill alteplase prior to next scheduled session. Cefepime and Vancomycin given at end of HD. All blood was returned.  Report given to Collene Leyden, RN.  Rockwell Alexandria, RN, CDN

## 2015-05-06 NOTE — Progress Notes (Signed)
Pt placed on Cpap full face mask.  10CMH20 30%.  Resting well at this time.  RT will continue to monitor.
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
## 2015-05-06 NOTE — ED Provider Notes (Addendum)
MSE was initiated and I personally evaluated the patient and placed orders (if any) at 6:35 AM on May 06, 2015.  The patient appears stable so that the remainder of the MSE may be completed by another provider.   Pt is a 45 y.o. male with history of end-stage renal disease on hemodialysis who was found unresponsive at home, hypoxic with EMS (73% on RA). Blood glucose is 103. He is arousable to loud voice but has to be shaken repeatedly to stay awake.  Apneic if not stimulated continuously. Oriented to person. Moving all extremities equally. Pupils pinpoint. Patient given 2 mg of IV Narcan and is now responsive, nauseous, agitated. Hypoxia, apnea resolved. Labs, chest x-ray have been ordered. Patient stable.    Layla MawKristen N Ryley Teater, DO 05/06/15 [phone]

## 2015-05-06 NOTE — ED Notes (Signed)
Family at bedside. 

## 2015-05-06 NOTE — Progress Notes (Signed)
CRITICAL VALUE ALERT  Critical value received:  ABG's   Date of notification:  05/06/2015  Time of notification:  1205  Critical value read back: Yes  Nurse who received alert:  Alcario DroughtErica, RN  MD notified (1st page):  Dr. Kerry HoughMemon  Time of first page:  1210  MD notified (2nd page):  Time of second page:  Responding MD: Dr. Kerry HoughMemon  Time MD responded:  [phone]

## 2015-05-06 NOTE — ED Notes (Signed)
MD at bedside. 

## 2015-05-06 NOTE — ED Notes (Signed)
Ems was called out for unresponsiveness. Pt is responding and oriented. Pt missed dialysis Monday and is due again today.

## 2015-05-07 ENCOUNTER — Inpatient Hospital Stay (HOSPITAL_COMMUNITY): Payer: Medicare Other

## 2015-05-07 DIAGNOSIS — I10 Essential (primary) hypertension: Secondary | ICD-10-CM

## 2015-05-07 DIAGNOSIS — G4733 Obstructive sleep apnea (adult) (pediatric): Secondary | ICD-10-CM

## 2015-05-07 LAB — BASIC METABOLIC PANEL
ANION GAP: 14 (ref 5–15)
BUN: 44 mg/dL — ABNORMAL HIGH (ref 6–20)
CALCIUM: 7.9 mg/dL — AB (ref 8.9–10.3)
CO2: 25 mmol/L (ref 22–32)
Chloride: 97 mmol/L — ABNORMAL LOW (ref 101–111)
Creatinine, Ser: 13.86 mg/dL — ABNORMAL HIGH (ref 0.61–1.24)
GFR, EST AFRICAN AMERICAN: 4 mL/min — AB (ref >60)
GFR, EST NON AFRICAN AMERICAN: 4 mL/min — AB (ref >60)
GLUCOSE: 94 mg/dL (ref 65–99)
Potassium: 4.6 mmol/L (ref 3.5–5.1)
Sodium: 136 mmol/L (ref 135–145)

## 2015-05-07 LAB — CBC
HCT: 45 % (ref 39.0–52.0)
HEMOGLOBIN: 14.2 g/dL (ref 13.0–17.0)
MCH: 29.3 pg (ref 26.0–34.0)
MCHC: 31.6 g/dL (ref 30.0–36.0)
MCV: 92.8 fL (ref 78.0–100.0)
Platelets: 272 10*3/uL (ref 150–400)
RBC: 4.85 MIL/uL (ref 4.22–5.81)
RDW: 16.1 % — AB (ref 11.5–15.5)
WBC: 8.2 10*3/uL (ref 4.0–10.5)

## 2015-05-07 LAB — HIV ANTIBODY (ROUTINE TESTING W REFLEX): HIV SCREEN 4TH GENERATION: NONREACTIVE

## 2015-05-07 MED ORDER — NALOXONE HCL 2 MG/2ML IJ SOSY
PREFILLED_SYRINGE | INTRAMUSCULAR | Status: AC
  Filled 2015-05-07: qty 8

## 2015-05-07 MED ORDER — IOPAMIDOL (ISOVUE-300) INJECTION 61%
75.0000 mL | Freq: Once | INTRAVENOUS | Status: AC | PRN
  Administered 2015-05-07: 75 mL via INTRAVENOUS

## 2015-05-07 NOTE — Consult Note (Signed)
   [facility] CM Inpatient Consult   05/07/2015  Perry [name] 05/22/1970 [id number]  Spoke with patient at bedside regarding [facility]. [facility] not [facility] to [facility] at [facility], however, [facility] and contact information for future reference, stating he will review and speak with his wife about program services. Of note, [facility] Care Management services would not replace or interfere with any services that are arranged by inpatient case management or social work. For additional questions or referrals please contact: Alben SpittleMary E. Albertha GheeNiemczura, RN, BSN, [facility]  [facility] Hospital Liaison [phone]

## 2015-05-07 NOTE — Progress Notes (Signed)
PROGRESS NOTE    Perry [name]  WJX:[id number] DOB: 12/01/1970 DOA: 05/06/2015 PCP: Leo Grosser, MD   Outpatient Specialists: Dr Wyn Quaker, Vascular surgeon; Dr. Terrace Arabia, Neurology; Dr. Casimiro Needle, Nephrology  Brief Narrative:  81 yom with a hx of ESRD, CHF, and HTN was found unresponsive by wife after missing last HD treatment on Monday. His HD schedule is MWF. Noted to have possible PNA in ED, and started on Vanc and Cefepime. He was also noted to have hyperkalemia. He received Narcan with improvement of AMS and he underwent HD yesterday. Due to recurrence of somnolence and hypoventilation, pt was started on a narcan infusion with significant improvement. Mental status appears to be improving and approaching baseline. He will likely need continued dialysis.    Assessment & Plan:   Active Problems:   Seizures (HCC)   HTN (hypertension)   ESRD (end stage renal disease) (HCC)   CHF (congestive heart failure) (HCC)   Hyperkalemia   Acute encephalopathy   OSA on CPAP   Morbid obesity (HCC)   1. Acute encephalopathy, likely related to diminished clearing of narcotic in ESRD. CT head negative. Pt has required a narcan infusion with improvement of his mental status. Will discontinue Narcan and continue observation. He will likely need continued dialysis. Mental status is overall improving.  2. Acute respiratory acidosis. Related to hypoventilation from #1. Improved with BiPAP therapy and administration of Narcan. Follow up blood gas shows normal pCO2.  3. Possible HCAP, revealed on CXR. MRSA PCR is negative, will d/c Vancomycin. Continue Cefepime. Remains afebrile. Will order CT chest for further evaluation. Blood Cx pending. 4. Acute on chronic diastolic CHF. CXR showed evidence of mild volume overload. Should show improvement with HD. No complaints of SOB.  5. ESRD, patient missed dialysis scheduled Monday. HD performed on 4/19, may need another tx today. Nephrology following.  6. HLD,  continue statin 7. Hyperkalemia, treated with IV insulin and dextrose and calcium gluconate and hemodialysis. Resolved. 8. Right arm blistering. Likely reactionary to IV infiltration. Will bandage to prevent rupture.  9. Acute on chronic diastolic congestive heart failure. Volume status appears to be improving. Continue current treatments. 10. Seizure disorder. Continue keppra. 11. Obstructive sleep apnea. Continue cPAP qhs. 12. Morbid obesity. Pt is status post sleep gastrectomy. Has lost over 100 lbs in the past year.    DVT prophylaxis: Eliquis Code Status: Full Family Communication: Wife present at bedside. Disposition Plan: anticipate discharge in 1-2 days once improved   Consultants:   Nephrology  Procedures:   Hemodialysis 4/19  Antimicrobials:   Cefepime 4/19 >>  Vancomycin 4/19 >> 4/20   Subjective: Feels improved. Per wife, he is talking, eating, and using the bathroom appropriately. He has had painful blisters appear overnight on his right arm.   Objective: Filed Vitals:   05/07/15 0100 05/07/15 0200 05/07/15 0300 05/07/15 0400  BP: 120/82 139/103 120/73 127/27  Pulse: 103 107 107 97  Temp:      TempSrc:      Resp: Height:      Weight:      SpO2: 97% 100% 99% 98%    Intake/Output Summary (Last 24 hours) at 05/07/15 0522 Last data filed at 05/06/15 2000  Gross per 24 hour  Intake 333.33 ml  Output      0 ml  Net 333.33 ml   Filed Weights   05/06/15 0638 05/06/15 0839 05/06/15 1025  Weight: 113.399 kg (250 lb) 114.8 kg (253 lb 1.4  oz) 114.8 kg (253 lb 1.4 oz)    Examination:   General exam: Appears calm and comfortable  Respiratory system: Clear to auscultation. Respiratory effort normal.  Cardiovascular system: S1 & S2 heard, RRR. No JVD, murmurs, rubs, gallops or clicks. No pedal edema. Gastrointestinal system: Abdomen is nondistended, soft and nontender. No organomegaly or masses felt. Normal bowel sounds heard. Central nervous  system: Alert and oriented. No focal neurological deficits. Extremities: Symmetric 5 x 5 power. Skin: Large clear fluid filled blisters noted over right forearm without any surrounding erythema Psychiatry: Judgement and insight appear normal. Mood & affect appropriate.     Data Reviewed: I have personally reviewed following labs and imaging studies  CBC:  Recent Labs Lab 05/06/15 0634  WBC 11.0*  NEUTROABS 9.8*  HGB 15.0  HCT 48.6  MCV 96.6  PLT PLATELET CLUMPS NOTED ON SMEAR, COUNT APPEARS ADEQUATE   Basic Metabolic Panel:  Recent Labs Lab 05/06/15 0712 05/06/15 1831  NA 137 139  K 6.9* 4.6  CL 99* 97*  CO2 21* 27  GLUCOSE 109* 109*  BUN 87* 39*  CREATININE 18.25* 10.93*  CALCIUM 7.2* 8.2*   GFR: Estimated Creatinine Clearance: 10.2 mL/min (by C-G formula based on Cr of 10.93). Liver Function Tests: No results for input(s): AST, ALT, ALKPHOS, BILITOT, PROT, ALBUMIN in the last 168 hours. No results for input(s): LIPASE, AMYLASE in the last 168 hours. No results for input(s): AMMONIA in the last 168 hours. Coagulation Profile: No results for input(s): INR, PROTIME in the last 168 hours. Cardiac Enzymes:  Recent Labs Lab 05/06/15 0714  TROPONINI <0.03   BNP (last 3 results) No results for input(s): PROBNP in the last 8760 hours. HbA1C: No results for input(s): HGBA1C in the last 72 hours. CBG:  Recent Labs Lab 05/06/15 0639  GLUCAP 103*   Lipid Profile: No results for input(s): CHOL, HDL, LDLCALC, TRIG, CHOLHDL, LDLDIRECT in the last 72 hours. Thyroid Function Tests: No results for input(s): TSH, T4TOTAL, FREET4, T3FREE, THYROIDAB in the last 72 hours. Anemia Panel: No results for input(s): VITAMINB12, FOLATE, FERRITIN, TIBC, IRON, RETICCTPCT in the last 72 hours. Urine analysis: No results found for: COLORURINE, APPEARANCEUR, LABSPEC, PHURINE, GLUCOSEU, HGBUR, BILIRUBINUR, KETONESUR, PROTEINUR, UROBILINOGEN, NITRITE, LEUKOCYTESUR Sepsis  Labs: @LABRCNTIP (procalcitonin:4,lacticidven:4)  ) Recent Results (from the past 240 hour(s))  Blood Culture (routine x 2)     Status: None (Preliminary result)   Collection Time: 05/06/15  8:14 AM  Result Value Ref Range Status   Specimen Description BLOOD RIGHT ARM  Final   Special Requests NONE BOTTLES DRAWN AEROBIC AND ANAEROBIC 6 CC EACH  Final   Culture PENDING  Incomplete   Report Status PENDING  Incomplete  Blood Culture (routine x 2)     Status: None (Preliminary result)   Collection Time: 05/06/15  9:38 AM  Result Value Ref Range Status   Specimen Description BLOOD RIGHT HAND  Final   Special Requests NONE BOTTLES DRAWN AEROBIC ONLY 8 CC  Final   Culture PENDING  Incomplete   Report Status PENDING  Incomplete  MRSA PCR Screening     Status: None   Collection Time: 05/06/15  3:00 PM  Result Value Ref Range Status   MRSA by PCR NEGATIVE NEGATIVE Final    Comment:        The GeneXpert MRSA Assay (FDA approved for NASAL specimens only), is one component of a comprehensive MRSA colonization surveillance program. It is not intended to diagnose MRSA infection nor to guide or  monitor treatment for MRSA infections.          Radiology Studies: Ct Head Wo Contrast  05/06/2015  CLINICAL DATA:  Found unresponsive this morning, shaking. Evaluate encephalopathy. History of end-stage renal disease on dialysis, missed dialysis this week. EXAM: CT HEAD WITHOUT CONTRAST TECHNIQUE: Contiguous axial images were obtained from the base of the skull through the vertex without intravenous contrast. COMPARISON:  CT head October 29, 2014 FINDINGS: Mild motion degraded examination. INTRACRANIAL CONTENTS: The ventricles and sulci are normal. No intraparenchymal hemorrhage, mass effect nor midline shift. No acute large vascular territory infarcts. No abnormal extra-axial fluid collections. Basal cisterns are patent. Mild calcific atherosclerosis of the carotid siphons. ORBITS: The included  ocular globes and orbital contents are normal. SINUSES: The mastoid aircells and included paranasal sinuses are well-aerated. SKULL/SOFT TISSUES: No skull fracture. No significant soft tissue swelling. IMPRESSION: Negative mildly motion degraded CT head. Electronically Signed   By: Awilda Metro M.D.   On: 05/06/2015 22:04   Dg Chest Port 1 View  05/06/2015  CLINICAL DATA:  Altered level consciousness, missed dialysis, hives EXAM: PORTABLE CHEST 1 VIEW COMPARISON:  08/07/2014. FINDINGS: Image quality is somewhat degraded by motion. Trachea is midline. Right subclavian stent, stable. Left sided dialysis catheter tip projects over the low SVC or SVC RA junction. Mild diffuse interstitial prominence and indistinctness. A rounded opacity projects over the lower left hemi thorax/left hemidiaphragm. No definite right pleural fluid. Left costophrenic angle was not included on the image. IMPRESSION: 1. Suspect mild interstitial pulmonary edema. 2. Rounded opacity projecting over the lower left hemi thorax may be due to consolidation/pneumonia. Difficult to exclude a mass. Continued attention on followup exams is warranted. Electronically Signed   By: Leanna Battles M.D.   On: 05/06/2015 07:11        Scheduled Meds: . apixaban  2.5 mg Oral BID  . aspirin EC  81 mg Oral Daily  . calcitRIOL  0.75 mcg Oral TID WC  . ceFEPime (MAXIPIME) IV  2 g Intravenous Q M,W,F-HD  . cinacalcet  120 mg Oral QPM  . escitalopram  20 mg Oral QHS  . levETIRAcetam  500 mg Oral Daily  . multivitamin  1 tablet Oral QHS  . pantoprazole  40 mg Oral Daily  . sevelamer carbonate  3,200 mg Oral TID WC  . vancomycin  1,000 mg Intravenous Q M,W,F-HD   Continuous Infusions: . naLOXone (NARCAN) adult infusion for OVERDOSE 1 mg/hr (05/07/15 0400)     LOS: 1 day    Time spent: 25 minutes    Erick Blinks, MD  [facility] Pager [phone]  If 7PM-7AM, please contact night-coverage [email or web address] Password  [email or web address] 05/07/2015, 5:22 AM   By signing my name below, I, Adron Bene, attest that this documentation has been prepared under the direction and in the presence of Erick Blinks, MD. Electronically Signed: Adron Bene 05/07/2015 9:20am

## 2015-05-07 NOTE — Progress Notes (Signed)
Perry [name]  MRN: [id number]  DOB/AGE: [birth year] 44 y.o.  Primary Care Physician:[name],WARREN TOM, MD  Admit date: 05/06/2015  Chief Complaint:  Chief Complaint  Patient presents with  . Altered Mental Status    S-Pt presented on  05/06/2015 with  Chief Complaint  Patient presents with  . Altered Mental Status  .    Pt today feels better. Pt main concerns is " When will I go for HD in am? "   Meds . apixaban  2.5 mg Oral BID  . aspirin EC  81 mg Oral Daily  . calcitRIOL  0.75 mcg Oral TID WC  . ceFEPime (MAXIPIME) IV  2 g Intravenous Q M,W,F-HD  . cinacalcet  120 mg Oral QPM  . escitalopram  20 mg Oral QHS  . levETIRAcetam  500 mg Oral Daily  . multivitamin  1 tablet Oral QHS  . pantoprazole  40 mg Oral Daily  . sevelamer carbonate  3,200 mg Oral TID WC      Physical Exam: Vital signs in last 24 hours: Temp:  [97.3 F (36.3 C)-98.7 F (37.1 C)] 98 F (36.7 C) (04/20 0800) Pulse Rate:  [97-118] 106 (04/20 0800) Resp:  [8-20] 13 (04/20 0800) BP: (87-145)/(27-103) 138/78 mmHg (04/20 0600) SpO2:  [92 %-100 %] 99 % (04/20 0800) Weight:  [259 lb 11.2 oz (117.8 kg)] 259 lb 11.2 oz (117.8 kg) (04/20 0600) Weight change: 3 lb 1.4 oz (1.401 kg)    Intake/Output from previous day: 04/19 0701 - 04/20 0700 In: 333.3 [I.V.:333.3] Out: 0  Total I/O In: 240 [P.O.:240] Out: -    Physical Exam: General- pt is much more arousable Resp- No acute REsp distress, decreased at bases. CVS- S1S2 regular in rate and rhythm GIT- BS+, soft, NT, ND, obese EXT- NO LE Edema,NO  Cyanosis Access- PC in situ   Lab Results: CBC  Recent Labs  05/06/15 0634 05/07/15 1102  WBC 11.0* 8.2  HGB 15.0 14.2  HCT 48.6 45.0  PLT PLATELET CLUMPS NOTED ON SMEAR, COUNT APPEARS ADEQUATE 272    BMET  Recent Labs  05/06/15 1831 05/07/15 1102  NA 139 136  K 4.6 4.6  CL 97* 97*  CO2 27 25  GLUCOSE 109* 94  BUN 39* 44*  CREATININE 10.93* 13.86*  CALCIUM 8.2* 7.9*     MICRO Recent Results (from the past 240 hour(s))  Blood Culture (routine x 2)     Status: None (Preliminary result)   Collection Time: 05/06/15  8:14 AM  Result Value Ref Range Status   Specimen Description BLOOD RIGHT ARM  Final   Special Requests NONE BOTTLES DRAWN AEROBIC AND ANAEROBIC 6 CC EACH  Final   Culture NO GROWTH 1 DAY  Final   Report Status PENDING  Incomplete  Blood Culture (routine x 2)     Status: None (Preliminary result)   Collection Time: 05/06/15  9:38 AM  Result Value Ref Range Status   Specimen Description BLOOD RIGHT HAND  Final   Special Requests NONE BOTTLES DRAWN AEROBIC ONLY 8 CC  Final   Culture NO GROWTH < 24 HOURS  Final   Report Status PENDING  Incomplete  MRSA PCR Screening     Status: None   Collection Time: 05/06/15  3:00 PM  Result Value Ref Range Status   MRSA by PCR NEGATIVE NEGATIVE Final    Comment:        The GeneXpert MRSA Assay (FDA approved for NASAL specimens only), is one component of  a comprehensive MRSA colonization surveillance program. It is not intended to diagnose MRSA infection nor to guide or monitor treatment for MRSA infections.       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 NO need of EPO  4)CKD Mineral-Bone Disorder Secondary Hyperparathyroidism present  on Cinacalcet  Phosphorus at goal  On Binders Calcium on lower sde  Sec to missed hd  5)CNS- admitted with AMS Improved asfter Narcan Primary MD following  6)Electrolytes  Hyperkalemic  Sec to Missed HD     Now better  NOrmonatremic   7)Acid base Co2 just at goal   Plan:  Will continue current care NO need of HD  today     Kelechi Astarita S 05/07/2015, 11:40 AM

## 2015-05-08 DIAGNOSIS — R911 Solitary pulmonary nodule: Secondary | ICD-10-CM | POA: Diagnosis present

## 2015-05-08 DIAGNOSIS — N2581 Secondary hyperparathyroidism of renal origin: Secondary | ICD-10-CM | POA: Diagnosis not present

## 2015-05-08 DIAGNOSIS — N186 End stage renal disease: Secondary | ICD-10-CM | POA: Diagnosis not present

## 2015-05-08 MED ORDER — CYCLOBENZAPRINE HCL 10 MG PO TABS: 10.0000 mg | ORAL_TABLET | Freq: Three times a day (TID) | ORAL | Status: DC | PRN

## 2015-05-08 NOTE — Care Management Note (Signed)
Case Management Note  Patient Details  Name: Dana Allanyrone E Cranford MRN: [id number] Date of Birth: [birth year]  Subjective/Objective:                  Pt is from home with wife. Pt is ind with ADL's. Pt is on HD three days a week. Pt plans to return home with self care.   Action/Plan: Discharging home today. No CM needs.   Expected Discharge Date:     05/08/2015             Expected Discharge Plan:  Home/Self Care  In-House Referral:  NA  Discharge planning Services  CM Consult  Post Acute Care Choice:  NA Choice offered to:  NA  DME Arranged:    DME Agency:     HH Arranged:    HH Agency:     Status of Service:  Completed, signed off  Medicare Important Message Given:  Yes Date Medicare IM Given:    Medicare IM give by:    Date Additional Medicare IM Given:    Additional Medicare Important Message give by:     If discussed at Long Length of Stay Meetings, dates discussed:    Additional Comments:  [name], Ivelise Castillo Demske, RN 05/08/2015, 11:57 AM

## 2015-05-08 NOTE — Care Management Important Message (Signed)
Important Message  Patient Details  Name: Perry [name] MRN: [id number] Date of Birth: 11-19-1970   Medicare Important Message Given:  Yes    [name], [name], RN 05/08/2015, 11:56 AM

## 2015-05-08 NOTE — Progress Notes (Signed)
PT REFUSING TO HAVE BLOOD DRAWN FOR LABS. WE WILL ATTEMPT TO GET LABS DRAWN DURING DIALYSIS.

## 2015-05-08 NOTE — Procedures (Signed)
   HEMODIALYSIS NURSING NOTE:   Discharge orders noted.  Arrangements made for pt to dialyze today at his outpatient center, [id number].  Altamese CabalJoyce McGehee, RN Clinic Manager confirmed pt arrived in time to complete his full treatment.  Arman FilterAngela Tiffny Gemmer, RN, CDN

## 2015-05-08 NOTE — Progress Notes (Signed)
[name] was discharged this am at 1115. [name] was to get dialysis today here but due to emergency dialysis patients could not be seen until late this afternoon and [name] said he was going to leave this morning. [address] and they said they could get him in if he arrived before noon. Paper work printed and IV access removed. Did use bandage to wrap blisters on right arm and told him if it gets worse to come back [facility] ER [facility]. Rolled to car in wheelchair and significant other drove patient to dialysis.

## 2015-05-08 NOTE — Discharge Summary (Addendum)
Physician Discharge Summary  Perry [name]:[id number] DOB: 1970/11/13 DOA: 05/06/2015  PCP: Leo Grosser, MD  Admit date: 05/06/2015 Discharge date: 05/08/2015  Time spent: 35 minutes  Recommendations for Outpatient Follow-up:  1. Follow up with PCP in 1-2 weeks 2. Continue HD as scheduled.  3. Follow up on blisters on right forearm 4. Repeat CT chest in 12 months for surveillance of RLL pulmonary nodule   Discharge Diagnoses:  Active Problems:   Seizures (HCC)   HTN (hypertension)   ESRD (end stage renal disease) (HCC)   CHF (congestive heart failure) (HCC)   Hyperkalemia   Acute encephalopathy   OSA on CPAP   Morbid obesity (HCC)   Solitary pulmonary nodule  Discharge Condition: Improved   Diet recommendation: Heart healthy  Filed Weights   05/07/15 0600 05/07/15 1900 05/08/15 0500  Weight: 117.8 kg (259 lb 11.2 oz) 68.9 kg (151 lb 14.4 oz) 117.8 kg (259 lb 11.2 oz)    History of present illness:  45 yom with a hx of ESRD, CHF, and HTN was found unresponsive by wife after missing last HD treatment on Monday. His HD schedule is MWF. Noted to have possible PNA in ED, and started on Vanc and Cefepime. He was also noted to have hyperkalemia. He received Narcan with improvement of AMS and admitted  Hospital Course:  Patient was admitted with acute encephalopathy after missing a session of dialysis, which likely related to diminished clearing of narcotic in ESRD. CT head negative. Pt initially required a narcan infusion with improvement of his mental status. Narcotic medications were held on admission. He underwent HD with further improvement of mental status. He does not report any recent changes in the doses of his pain medications. He reports that he has usually done fairly well on his current doses. He was advised to be cautious when taking further pain medications, especially if he is inconsistent with dialysis. Narcan discontinued. Mental status appears to be back  to baseline. .  1. Acute respiratory acidosis. Related to hypoventilation from #1. Resolved with BiPAP therapy and administration of Narcan. Follow up blood gas revealed normal pCO2. 2. Acute on chronic diastolic CHF. CXR revealed evidence of mild volume overload. Improvement with HD. No complaints of SOB.  3. ESRD, patient missed dialysis scheduled 4/17. HD performed on 4/19. Nephrology following.  4. HLD, continue statin 5. Hyperkalemia, treated with IV insulin and dextrose and calcium gluconate and hemodialysis. Resolved. 6. Right arm blistering. Likely reactionary to IV infiltration. Many noted to have ruptured but more developing. Instructed to keep the arm bandaged and cleaned to avoid infection. No evidence of cellulitis or compartment syndrome at this time. He has been advised to monitor for any worsened swelling or developing fevers. If symptoms persist/worsen he may need referral to dermatologist. 7. Seizure disorder. Continue keppra. 8. Obstructive sleep apnea. Continue CPAP qhs. 9. Morbid obesity. Pt is status post sleeve gastrectomy. Has lost over 100 lbs in the past year. 10. Incidental finding of RLL pulmonary nodule on CT scan. Will need repeat CT in 12 months for surveillance.   Procedures:  HD 4/19  HD 4/21  Consultations:  Nephrology   Discharge Exam: Filed Vitals:   05/08/15 0700 05/08/15 0800  BP: 138/95 143/82  Pulse:    Temp:  97 F (36.1 C)  Resp: 9 13     General: NAD, looks comfortable  Cardiovascular: RRR, S1, S2   Respiratory: clear bilaterally, No wheezing, rales or rhonchi  Abdomen: soft, non tender, no  distention , bowel sounds normal  Musculoskeletal: No edema b/l  Skin: Multiple blisters noted on right arm. No erythema or evidence of cellulitis.   Discharge Instructions   Discharge Instructions    Diet - low sodium heart healthy    Complete by:  As directed      Increase activity slowly    Complete by:  As directed            Current Discharge Medication List    CONTINUE these medications which have CHANGED   Details  cyclobenzaprine (FLEXERIL) 10 MG tablet Take 1 tablet (10 mg total) by mouth 3 (three) times daily as needed for muscle spasms. Qty: 90 tablet, Refills: 0      CONTINUE these medications which have NOT CHANGED   Details  acetaminophen (TYLENOL) 325 MG tablet Take 650 mg by mouth every 6 (six) hours as needed for mild pain or moderate pain.    alprazolam (XANAX) 2 MG tablet TAKE ONE TABLET BY MOUTH ONCE DAILY AT BEDTIME AS NEEDED - MUST  BE  30  DAYS  BETWEEN  REFILLS Qty: 30 tablet, Refills: 0    apixaban (ELIQUIS) 2.5 MG TABS tablet Take 2.5 mg by mouth 2 (two) times daily.    aspirin 81 MG tablet Take 81 mg by mouth daily.    calcitRIOL (ROCALTROL) 0.25 MCG capsule Take 0.75 mcg by mouth 3 (three) times daily with meals.     cinacalcet (SENSIPAR) 60 MG tablet Take 120 mg by mouth every evening.     escitalopram (LEXAPRO) 20 MG tablet Take 1 tablet (20 mg total) by mouth at bedtime. Qty: 30 tablet, Refills: 5   Associated Diagnoses: GAD (generalized anxiety disorder)    levETIRAcetam (KEPPRA XR) 500 MG 24 hr tablet Take 1 tablet (500 mg total) by mouth daily. Qty: 30 tablet, Refills: 11    multivitamin (RENA-VIT) TABS tablet Take 1 tablet by mouth at bedtime.     Oxycodone HCl 10 MG TABS Take 1 tablet (10 mg total) by mouth every 4 (four) hours as needed (for pain). Qty: 120 tablet, Refills: 0    pantoprazole (PROTONIX) 20 MG tablet Take 1 tablet (20 mg total) by mouth daily. Qty: 30 tablet, Refills: 1    polyethylene glycol (MIRALAX / GLYCOLAX) packet Take 1 packet by mouth daily as needed.    sevelamer (RENVELA) 800 MG tablet Take 3,200 mg by mouth 3 (three) times daily with meals.        Allergies  Allergen Reactions  . Depakote [Divalproex Sodium] Other (See Comments)    Hallucinations       The results of significant diagnostics from this hospitalization  (including imaging, microbiology, ancillary and laboratory) are listed below for reference.    Significant Diagnostic Studies: Ct Head Wo Contrast  05/06/2015  CLINICAL DATA:  Found unresponsive this morning, shaking. Evaluate encephalopathy. History of end-stage renal disease on dialysis, missed dialysis this week. EXAM: CT HEAD WITHOUT CONTRAST TECHNIQUE: Contiguous axial images were obtained from the base of the skull through the vertex without intravenous contrast. COMPARISON:  CT head October 29, 2014 FINDINGS: Mild motion degraded examination. INTRACRANIAL CONTENTS: The ventricles and sulci are normal. No intraparenchymal hemorrhage, mass effect nor midline shift. No acute large vascular territory infarcts. No abnormal extra-axial fluid collections. Basal cisterns are patent. Mild calcific atherosclerosis of the carotid siphons. ORBITS: The included ocular globes and orbital contents are normal. SINUSES: The mastoid aircells and included paranasal sinuses are well-aerated. SKULL/SOFT TISSUES: No skull fracture.  No significant soft tissue swelling. IMPRESSION: Negative mildly motion degraded CT head. Electronically Signed   By: Awilda Metro M.D.   On: 05/06/2015 22:04   Ct Chest W Contrast  05/07/2015  CLINICAL DATA:  45 year old male inpatient on hemodialysis with question of left lower lobe pneumonia on recent chest radiograph. EXAM: CT CHEST WITH CONTRAST TECHNIQUE: Multidetector CT imaging of the chest was performed during intravenous contrast administration. CONTRAST:  75mL ISOVUE-300 IOPAMIDOL (ISOVUE-300) INJECTION 61% COMPARISON:  Chest radiograph from one day prior. FINDINGS: Mediastinum/Nodes: Normal heart size. Trace pericardial fluid/ thickening. Right coronary atherosclerosis. Left internal jugular central venous catheter terminates in the lower third of the superior vena cava. Venous stents are noted in the right subclavian and right brachiocephalic veins, with associated luminal  low-attenuation in the stented portions of these veins suggesting thrombosis. Atherosclerotic nonaneurysmal thoracic aorta. Normal caliber pulmonary arteries. Normal visualized thyroid. Normal esophagus. Mild bilateral axillary lymphadenopathy, largest 1.0 cm in the right axilla (series 2/ image 37) and 1.2 cm in the left axilla (series 2/ image 33). No pathologically enlarged mediastinal or hilar nodes. Lungs/Pleura: No pneumothorax. No pleural effusion. Subpleural 5 mm anterior right lower lobe pulmonary nodule (series 3/image 80). No acute consolidative airspace disease, additional significant pulmonary nodules or lung masses. Mild patchy interlobular septal thickening and ground-glass opacity throughout the periphery of both lungs, upper lobe predominant, favor minimal pulmonary edema. Upper abdomen: Visualized native kidneys are atrophic with multiple subcentimeter hypodense renal cortical lesions and the visualized portions of both native kidneys, too small to characterize, probably benign renal cysts. Postsurgical changes from sleeve gastrectomy. Small fat containing left Bochdalek's hernia. Musculoskeletal: No aggressive appearing focal osseous lesions. There are nondisplaced incompletely healed fractures of the left C7 transverse process, of the left T1-T9 transverse processes and of the medial posterior left second, tenth and eleventh ribs, which are probably subacute given healing responses at some of the fracture sites. Mild degenerative changes throughout the thoracic spine. Asymmetric moderate right gynecomastia. IMPRESSION: 1. No acute consolidative airspace disease to suggest a pneumonia. The opacity described at the left lung base on the chest radiograph from 05/06/2015 correlates with a small fat containing left Bochdalek's hernia. 2. Minimal pulmonary edema. 3. Incompletely healed, nondisplaced and probably subacute left C7 and left T1-T9 transverse process fractures and left posterior second,  tenth and eleventh rib fractures. Correlate with recent trauma history. 4. Trace pericardial fluid/thickening. 5. Nonspecific mild bilateral axillary lymphadenopathy. 6. Probable deep venous thrombosis at the site of venous stents in the right subclavian and right brachiocephalic veins. 7. Solitary 5 mm subpleural right lower lobe pulmonary nodule, probably benign. No follow-up needed if patient is low-risk. Non-contrast chest CT can be considered in 12 months if patient is high-risk. This recommendation follows the consensus statement: Guidelines for Management of Incidental Pulmonary Nodules Detected on CT Images:From the Fleischner Society 2017; published online before print (10.1148/radiol.[id number]). 8. One vessel coronary atherosclerosis. Electronically Signed   By: Delbert Phenix M.D.   On: 05/07/2015 13:36   Dg Chest Port 1 View  05/06/2015  CLINICAL DATA:  Altered level consciousness, missed dialysis, hives EXAM: PORTABLE CHEST 1 VIEW COMPARISON:  08/07/2014. FINDINGS: Image quality is somewhat degraded by motion. Trachea is midline. Right subclavian stent, stable. Left sided dialysis catheter tip projects over the low SVC or SVC RA junction. Mild diffuse interstitial prominence and indistinctness. A rounded opacity projects over the lower left hemi thorax/left hemidiaphragm. No definite right pleural fluid. Left costophrenic angle was not included on the  image. IMPRESSION: 1. Suspect mild interstitial pulmonary edema. 2. Rounded opacity projecting over the lower left hemi thorax may be due to consolidation/pneumonia. Difficult to exclude a mass. Continued attention on followup exams is warranted. Electronically Signed   By: Leanna Battles M.D.   On: 05/06/2015 07:11    Microbiology: Recent Results (from the past 240 hour(s))  Blood Culture (routine x 2)     Status: None (Preliminary result)   Collection Time: 05/06/15  8:14 AM  Result Value Ref Range Status   Specimen Description BLOOD RIGHT ARM   Final   Special Requests NONE BOTTLES DRAWN AEROBIC AND ANAEROBIC 6 CC EACH  Final   Culture NO GROWTH 2 DAYS  Final   Report Status PENDING  Incomplete  Blood Culture (routine x 2)     Status: None (Preliminary result)   Collection Time: 05/06/15  9:38 AM  Result Value Ref Range Status   Specimen Description BLOOD RIGHT HAND  Final   Special Requests NONE BOTTLES DRAWN AEROBIC ONLY 8 CC  Final   Culture NO GROWTH 2 DAYS  Final   Report Status PENDING  Incomplete  MRSA PCR Screening     Status: None   Collection Time: 05/06/15  3:00 PM  Result Value Ref Range Status   MRSA by PCR NEGATIVE NEGATIVE Final    Comment:        The GeneXpert MRSA Assay (FDA approved for NASAL specimens only), is one component of a comprehensive MRSA colonization surveillance program. It is not intended to diagnose MRSA infection nor to guide or monitor treatment for MRSA infections.      Labs: Basic Metabolic Panel:  Recent Labs Lab 05/06/15 0712 05/06/15 1831 05/07/15 1102  NA 137 139 136  K 6.9* 4.6 4.6  CL 99* 97* 97*  CO2 21* 27 25  GLUCOSE 109* 109* 94  BUN 87* 39* 44*  CREATININE 18.25* 10.93* 13.86*  CALCIUM 7.2* 8.2* 7.9*  CBC:  Recent Labs Lab 05/06/15 0634 05/07/15 1102  WBC 11.0* 8.2  NEUTROABS 9.8*  --   HGB 15.0 14.2  HCT 48.6 45.0  MCV 96.6 92.8  PLT PLATELET CLUMPS NOTED ON SMEAR, COUNT APPEARS ADEQUATE 272   Cardiac Enzymes:  Recent Labs Lab 05/06/15 0714  TROPONINI <0.03   BNP: BNP (last 3 results)  Recent Labs  05/06/15 0714  BNP 129.0*   CBG:  Recent Labs Lab 05/06/15 1610  GLUCAP 103*       Signed:  Erick Blinks, MD [facility] 05/08/2015, 10:21 AM   By signing my name below, I, Zadie Cleverly, attest that this documentation has been prepared under the direction and in the presence of Erick Blinks, MD. Electronically signed: Zadie Cleverly, Scribe. 05/08/2015  9:45am   I, Dr. Erick Blinks, personally performed  the services described in this documentaiton. All medical record entries made by the scribe were at my direction and in my presence. I have reviewed the chart and agree that the record reflects my personal performance and is accurate and complete  Erick Blinks, MD, 05/08/2015 10:21 AM

## 2015-05-08 NOTE — Progress Notes (Signed)
Perry [name]  MRN: [id number]  DOB/AGE: [birth year] 45 y.o.  Primary Care Physician:Perry TOM, MD  Admit date: 05/06/2015  Chief Complaint:  Chief Complaint  Patient presents with  . Altered Mental Status    S-Pt presented on  05/06/2015 with  Chief Complaint  Patient presents with  . Altered Mental Status  .    Pt today feels better. Pt main concerns is " When will I go home? "   Meds . apixaban  2.5 mg Oral BID  . aspirin EC  81 mg Oral Daily  . calcitRIOL  0.75 mcg Oral TID WC  . cinacalcet  120 mg Oral QPM  . escitalopram  20 mg Oral QHS  . levETIRAcetam  500 mg Oral Daily  . multivitamin  1 tablet Oral QHS  . pantoprazole  40 mg Oral Daily  . sevelamer carbonate  3,200 mg Oral TID WC      Physical Exam: Vital signs in last 24 hours: Temp:  [97 F (36.1 C)-98.2 F (36.8 C)] 97.2 F (36.2 C) (04/21 0400) Pulse Rate:  [80-147] 147 (04/21 0500) Resp:  [6-35] 8 (04/21 0600) BP: (122-173)/(69-129) 147/96 mmHg (04/21 0600) SpO2:  [83 %-99 %] 88 % (04/21 0500) Weight:  [151 lb 14.4 oz (68.9 kg)-259 lb 11.2 oz (117.8 kg)] 259 lb 11.2 oz (117.8 kg) (04/21 0500) Weight change: -101 lb 3.1 oz (-45.9 kg)    Intake/Output from previous day: 04/20 0701 - 04/21 0700 In: 840 [P.O.:840] Out: -      Physical Exam: General- pt awake,alert, follows commands. Resp- No acute REsp distress, decreased at bases. CVS- S1S2 regular in rate and rhythm GIT- BS+, soft, NT, ND, obese EXT- NO LE Edema,NO  Cyanosis Access- PC in situ   Lab Results: CBC  Recent Labs  05/06/15 0634 05/07/15 1102  WBC 11.0* 8.2  HGB 15.0 14.2  HCT 48.6 45.0  PLT PLATELET CLUMPS NOTED ON SMEAR, COUNT APPEARS ADEQUATE 272    BMET  Recent Labs  05/06/15 1831 05/07/15 1102  NA 139 136  K 4.6 4.6  CL 97* 97*  CO2 27 25  GLUCOSE 109* 94  BUN 39* 44*  CREATININE 10.93* 13.86*  CALCIUM 8.2* 7.9*    MICRO Recent Results (from the past 240 hour(s))  Blood Culture  (routine x 2)     Status: None (Preliminary result)   Collection Time: 05/06/15  8:14 AM  Result Value Ref Range Status   Specimen Description BLOOD RIGHT ARM  Final   Special Requests NONE BOTTLES DRAWN AEROBIC AND ANAEROBIC 6 CC EACH  Final   Culture NO GROWTH 1 DAY  Final   Report Status PENDING  Incomplete  Blood Culture (routine x 2)     Status: None (Preliminary result)   Collection Time: 05/06/15  9:38 AM  Result Value Ref Range Status   Specimen Description BLOOD RIGHT HAND  Final   Special Requests NONE BOTTLES DRAWN AEROBIC ONLY 8 CC  Final   Culture NO GROWTH < 24 HOURS  Final   Report Status PENDING  Incomplete  MRSA PCR Screening     Status: None   Collection Time: 05/06/15  3:00 PM  Result Value Ref Range Status   MRSA by PCR NEGATIVE NEGATIVE Final    Comment:        The GeneXpert MRSA Assay (FDA approved for NASAL specimens only), is one component of a comprehensive MRSA colonization surveillance program. It is not intended to diagnose MRSA infection nor to  guide or monitor treatment for MRSA infections.       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 NO need of EPO  4)CKD Mineral-Bone Disorder Secondary Hyperparathyroidism present  on Cinacalcet  Phosphorus at goal  On Binders Calcium on lower sde  Sec to missed hd  5)CNS- admitted with AMS Improved asfter Narcan Primary MD following  6)Electrolytes  Hyperkalemic  Sec to Missed HD     Now better  NOrmonatremic   7)Acid base Co2 just at goal   Plan:  Will dialyze today Will use 2 k bath  Addendum Pt was to be dialyzed today but pt insisted on going home. Pt out patient  dialyzed center was calledd and they did confirm that they can accommodate him today.     Perry [name] S 05/08/2015, 7:18 AM

## 2015-05-11 DIAGNOSIS — N186 End stage renal disease: Secondary | ICD-10-CM | POA: Diagnosis not present

## 2015-05-11 DIAGNOSIS — N2581 Secondary hyperparathyroidism of renal origin: Secondary | ICD-10-CM | POA: Diagnosis not present

## 2015-05-11 LAB — CULTURE, BLOOD (ROUTINE X 2)
Culture: NO GROWTH
Culture: NO GROWTH

## 2015-05-14 DIAGNOSIS — N186 End stage renal disease: Secondary | ICD-10-CM | POA: Diagnosis not present

## 2015-05-14 DIAGNOSIS — N2581 Secondary hyperparathyroidism of renal origin: Secondary | ICD-10-CM | POA: Diagnosis not present

## 2015-05-14 DIAGNOSIS — D500 Iron deficiency anemia secondary to blood loss (chronic): Secondary | ICD-10-CM | POA: Diagnosis not present

## 2015-05-16 DIAGNOSIS — N2581 Secondary hyperparathyroidism of renal origin: Secondary | ICD-10-CM | POA: Diagnosis not present

## 2015-05-16 DIAGNOSIS — N186 End stage renal disease: Secondary | ICD-10-CM | POA: Diagnosis not present

## 2015-05-16 DIAGNOSIS — D500 Iron deficiency anemia secondary to blood loss (chronic): Secondary | ICD-10-CM | POA: Diagnosis not present

## 2015-05-17 DIAGNOSIS — I120 Hypertensive chronic kidney disease with stage 5 chronic kidney disease or end stage renal disease: Secondary | ICD-10-CM | POA: Diagnosis not present

## 2015-05-17 DIAGNOSIS — N186 End stage renal disease: Secondary | ICD-10-CM | POA: Diagnosis not present

## 2015-05-17 DIAGNOSIS — Z992 Dependence on renal dialysis: Secondary | ICD-10-CM | POA: Diagnosis not present

## 2015-05-19 DIAGNOSIS — N186 End stage renal disease: Secondary | ICD-10-CM | POA: Diagnosis not present

## 2015-05-19 DIAGNOSIS — N2581 Secondary hyperparathyroidism of renal origin: Secondary | ICD-10-CM | POA: Diagnosis not present

## 2015-05-19 DIAGNOSIS — D500 Iron deficiency anemia secondary to blood loss (chronic): Secondary | ICD-10-CM | POA: Diagnosis not present

## 2015-05-21 DIAGNOSIS — N2581 Secondary hyperparathyroidism of renal origin: Secondary | ICD-10-CM | POA: Diagnosis not present

## 2015-05-21 DIAGNOSIS — N186 End stage renal disease: Secondary | ICD-10-CM | POA: Diagnosis not present

## 2015-05-21 DIAGNOSIS — D500 Iron deficiency anemia secondary to blood loss (chronic): Secondary | ICD-10-CM | POA: Diagnosis not present

## 2015-05-23 DIAGNOSIS — N186 End stage renal disease: Secondary | ICD-10-CM | POA: Diagnosis not present

## 2015-05-23 DIAGNOSIS — D500 Iron deficiency anemia secondary to blood loss (chronic): Secondary | ICD-10-CM | POA: Diagnosis not present

## 2015-05-23 DIAGNOSIS — N2581 Secondary hyperparathyroidism of renal origin: Secondary | ICD-10-CM | POA: Diagnosis not present

## 2015-05-26 DIAGNOSIS — D500 Iron deficiency anemia secondary to blood loss (chronic): Secondary | ICD-10-CM | POA: Diagnosis not present

## 2015-05-26 DIAGNOSIS — N186 End stage renal disease: Secondary | ICD-10-CM | POA: Diagnosis not present

## 2015-05-26 DIAGNOSIS — N2581 Secondary hyperparathyroidism of renal origin: Secondary | ICD-10-CM | POA: Diagnosis not present

## 2015-05-28 DIAGNOSIS — N2581 Secondary hyperparathyroidism of renal origin: Secondary | ICD-10-CM | POA: Diagnosis not present

## 2015-05-28 DIAGNOSIS — D500 Iron deficiency anemia secondary to blood loss (chronic): Secondary | ICD-10-CM | POA: Diagnosis not present

## 2015-05-28 DIAGNOSIS — N186 End stage renal disease: Secondary | ICD-10-CM | POA: Diagnosis not present

## 2015-05-30 DIAGNOSIS — D500 Iron deficiency anemia secondary to blood loss (chronic): Secondary | ICD-10-CM | POA: Diagnosis not present

## 2015-05-30 DIAGNOSIS — N186 End stage renal disease: Secondary | ICD-10-CM | POA: Diagnosis not present

## 2015-05-30 DIAGNOSIS — N2581 Secondary hyperparathyroidism of renal origin: Secondary | ICD-10-CM | POA: Diagnosis not present

## 2015-06-02 DIAGNOSIS — N2581 Secondary hyperparathyroidism of renal origin: Secondary | ICD-10-CM | POA: Diagnosis not present

## 2015-06-02 DIAGNOSIS — D500 Iron deficiency anemia secondary to blood loss (chronic): Secondary | ICD-10-CM | POA: Diagnosis not present

## 2015-06-02 DIAGNOSIS — N186 End stage renal disease: Secondary | ICD-10-CM | POA: Diagnosis not present

## 2015-06-04 DIAGNOSIS — N2581 Secondary hyperparathyroidism of renal origin: Secondary | ICD-10-CM | POA: Diagnosis not present

## 2015-06-04 DIAGNOSIS — N186 End stage renal disease: Secondary | ICD-10-CM | POA: Diagnosis not present

## 2015-06-04 DIAGNOSIS — D500 Iron deficiency anemia secondary to blood loss (chronic): Secondary | ICD-10-CM | POA: Diagnosis not present

## 2015-06-06 DIAGNOSIS — D500 Iron deficiency anemia secondary to blood loss (chronic): Secondary | ICD-10-CM | POA: Diagnosis not present

## 2015-06-06 DIAGNOSIS — N186 End stage renal disease: Secondary | ICD-10-CM | POA: Diagnosis not present

## 2015-06-06 DIAGNOSIS — N2581 Secondary hyperparathyroidism of renal origin: Secondary | ICD-10-CM | POA: Diagnosis not present

## 2015-06-08 ENCOUNTER — Telehealth: Payer: Self-pay | Admitting: Family Medicine

## 2015-06-08 ENCOUNTER — Other Ambulatory Visit: Payer: Self-pay | Admitting: Family Medicine

## 2015-06-08 NOTE — Telephone Encounter (Signed)
Pt aware via mychart message 

## 2015-06-08 NOTE — Telephone Encounter (Signed)
Ok to refill 
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
## 2015-06-08 NOTE — Telephone Encounter (Signed)
Pt would like to p/u a rx for Oxycodone 10 mg and Alprazolam 2 mg [phone]

## 2015-06-09 DIAGNOSIS — N186 End stage renal disease: Secondary | ICD-10-CM | POA: Diagnosis not present

## 2015-06-09 DIAGNOSIS — D500 Iron deficiency anemia secondary to blood loss (chronic): Secondary | ICD-10-CM | POA: Diagnosis not present

## 2015-06-09 DIAGNOSIS — N2581 Secondary hyperparathyroidism of renal origin: Secondary | ICD-10-CM | POA: Diagnosis not present

## 2015-06-10 NOTE — Telephone Encounter (Signed)
Spoke to pt and is aware of provider recommendations.

## 2015-06-11 DIAGNOSIS — N2581 Secondary hyperparathyroidism of renal origin: Secondary | ICD-10-CM | POA: Diagnosis not present

## 2015-06-11 DIAGNOSIS — N186 End stage renal disease: Secondary | ICD-10-CM | POA: Diagnosis not present

## 2015-06-11 DIAGNOSIS — D500 Iron deficiency anemia secondary to blood loss (chronic): Secondary | ICD-10-CM | POA: Diagnosis not present

## 2015-06-13 DIAGNOSIS — N186 End stage renal disease: Secondary | ICD-10-CM | POA: Diagnosis not present

## 2015-06-13 DIAGNOSIS — D500 Iron deficiency anemia secondary to blood loss (chronic): Secondary | ICD-10-CM | POA: Diagnosis not present

## 2015-06-13 DIAGNOSIS — N2581 Secondary hyperparathyroidism of renal origin: Secondary | ICD-10-CM | POA: Diagnosis not present

## 2015-06-16 DIAGNOSIS — N186 End stage renal disease: Secondary | ICD-10-CM | POA: Diagnosis not present

## 2015-06-16 DIAGNOSIS — N2581 Secondary hyperparathyroidism of renal origin: Secondary | ICD-10-CM | POA: Diagnosis not present

## 2015-06-16 DIAGNOSIS — D500 Iron deficiency anemia secondary to blood loss (chronic): Secondary | ICD-10-CM | POA: Diagnosis not present

## 2015-06-17 DIAGNOSIS — Z992 Dependence on renal dialysis: Secondary | ICD-10-CM | POA: Diagnosis not present

## 2015-06-17 DIAGNOSIS — I120 Hypertensive chronic kidney disease with stage 5 chronic kidney disease or end stage renal disease: Secondary | ICD-10-CM | POA: Diagnosis not present

## 2015-06-17 DIAGNOSIS — N186 End stage renal disease: Secondary | ICD-10-CM | POA: Diagnosis not present

## 2015-06-18 DIAGNOSIS — N2581 Secondary hyperparathyroidism of renal origin: Secondary | ICD-10-CM | POA: Diagnosis not present

## 2015-06-18 DIAGNOSIS — N186 End stage renal disease: Secondary | ICD-10-CM | POA: Diagnosis not present

## 2015-06-18 DIAGNOSIS — D689 Coagulation defect, unspecified: Secondary | ICD-10-CM | POA: Diagnosis not present

## 2015-06-20 DIAGNOSIS — N2581 Secondary hyperparathyroidism of renal origin: Secondary | ICD-10-CM | POA: Diagnosis not present

## 2015-06-20 DIAGNOSIS — D689 Coagulation defect, unspecified: Secondary | ICD-10-CM | POA: Diagnosis not present

## 2015-06-20 DIAGNOSIS — N186 End stage renal disease: Secondary | ICD-10-CM | POA: Diagnosis not present

## 2015-06-23 DIAGNOSIS — N2581 Secondary hyperparathyroidism of renal origin: Secondary | ICD-10-CM | POA: Diagnosis not present

## 2015-06-23 DIAGNOSIS — N186 End stage renal disease: Secondary | ICD-10-CM | POA: Diagnosis not present

## 2015-06-23 DIAGNOSIS — D689 Coagulation defect, unspecified: Secondary | ICD-10-CM | POA: Diagnosis not present

## 2015-06-25 DIAGNOSIS — N2581 Secondary hyperparathyroidism of renal origin: Secondary | ICD-10-CM | POA: Diagnosis not present

## 2015-06-25 DIAGNOSIS — D689 Coagulation defect, unspecified: Secondary | ICD-10-CM | POA: Diagnosis not present

## 2015-06-25 DIAGNOSIS — N186 End stage renal disease: Secondary | ICD-10-CM | POA: Diagnosis not present

## 2015-06-27 DIAGNOSIS — N2581 Secondary hyperparathyroidism of renal origin: Secondary | ICD-10-CM | POA: Diagnosis not present

## 2015-06-27 DIAGNOSIS — D689 Coagulation defect, unspecified: Secondary | ICD-10-CM | POA: Diagnosis not present

## 2015-06-27 DIAGNOSIS — N186 End stage renal disease: Secondary | ICD-10-CM | POA: Diagnosis not present

## 2015-06-30 DIAGNOSIS — D689 Coagulation defect, unspecified: Secondary | ICD-10-CM | POA: Diagnosis not present

## 2015-06-30 DIAGNOSIS — N186 End stage renal disease: Secondary | ICD-10-CM | POA: Diagnosis not present

## 2015-06-30 DIAGNOSIS — N2581 Secondary hyperparathyroidism of renal origin: Secondary | ICD-10-CM | POA: Diagnosis not present

## 2015-07-02 DIAGNOSIS — N186 End stage renal disease: Secondary | ICD-10-CM | POA: Diagnosis not present

## 2015-07-02 DIAGNOSIS — D689 Coagulation defect, unspecified: Secondary | ICD-10-CM | POA: Diagnosis not present

## 2015-07-02 DIAGNOSIS — N2581 Secondary hyperparathyroidism of renal origin: Secondary | ICD-10-CM | POA: Diagnosis not present

## 2015-07-04 DIAGNOSIS — N2581 Secondary hyperparathyroidism of renal origin: Secondary | ICD-10-CM | POA: Diagnosis not present

## 2015-07-04 DIAGNOSIS — N186 End stage renal disease: Secondary | ICD-10-CM | POA: Diagnosis not present

## 2015-07-04 DIAGNOSIS — D689 Coagulation defect, unspecified: Secondary | ICD-10-CM | POA: Diagnosis not present

## 2015-07-07 DIAGNOSIS — N2581 Secondary hyperparathyroidism of renal origin: Secondary | ICD-10-CM | POA: Diagnosis not present

## 2015-07-07 DIAGNOSIS — D689 Coagulation defect, unspecified: Secondary | ICD-10-CM | POA: Diagnosis not present

## 2015-07-07 DIAGNOSIS — N186 End stage renal disease: Secondary | ICD-10-CM | POA: Diagnosis not present

## 2015-07-09 DIAGNOSIS — N2581 Secondary hyperparathyroidism of renal origin: Secondary | ICD-10-CM | POA: Diagnosis not present

## 2015-07-09 DIAGNOSIS — N186 End stage renal disease: Secondary | ICD-10-CM | POA: Diagnosis not present

## 2015-07-09 DIAGNOSIS — D689 Coagulation defect, unspecified: Secondary | ICD-10-CM | POA: Diagnosis not present

## 2015-07-11 DIAGNOSIS — D689 Coagulation defect, unspecified: Secondary | ICD-10-CM | POA: Diagnosis not present

## 2015-07-11 DIAGNOSIS — N2581 Secondary hyperparathyroidism of renal origin: Secondary | ICD-10-CM | POA: Diagnosis not present

## 2015-07-11 DIAGNOSIS — N186 End stage renal disease: Secondary | ICD-10-CM | POA: Diagnosis not present

## 2015-07-14 DIAGNOSIS — N186 End stage renal disease: Secondary | ICD-10-CM | POA: Diagnosis not present

## 2015-07-14 DIAGNOSIS — D689 Coagulation defect, unspecified: Secondary | ICD-10-CM | POA: Diagnosis not present

## 2015-07-14 DIAGNOSIS — N2581 Secondary hyperparathyroidism of renal origin: Secondary | ICD-10-CM | POA: Diagnosis not present

## 2015-07-16 DIAGNOSIS — N2581 Secondary hyperparathyroidism of renal origin: Secondary | ICD-10-CM | POA: Diagnosis not present

## 2015-07-16 DIAGNOSIS — D689 Coagulation defect, unspecified: Secondary | ICD-10-CM | POA: Diagnosis not present

## 2015-07-16 DIAGNOSIS — N186 End stage renal disease: Secondary | ICD-10-CM | POA: Diagnosis not present

## 2015-07-17 DIAGNOSIS — I120 Hypertensive chronic kidney disease with stage 5 chronic kidney disease or end stage renal disease: Secondary | ICD-10-CM | POA: Diagnosis not present

## 2015-07-17 DIAGNOSIS — Z992 Dependence on renal dialysis: Secondary | ICD-10-CM | POA: Diagnosis not present

## 2015-07-17 DIAGNOSIS — N186 End stage renal disease: Secondary | ICD-10-CM | POA: Diagnosis not present

## 2015-07-18 DIAGNOSIS — N186 End stage renal disease: Secondary | ICD-10-CM | POA: Diagnosis not present

## 2015-07-18 DIAGNOSIS — D689 Coagulation defect, unspecified: Secondary | ICD-10-CM | POA: Diagnosis not present

## 2015-07-18 DIAGNOSIS — N2581 Secondary hyperparathyroidism of renal origin: Secondary | ICD-10-CM | POA: Diagnosis not present

## 2015-07-21 DIAGNOSIS — N186 End stage renal disease: Secondary | ICD-10-CM | POA: Diagnosis not present

## 2015-07-21 DIAGNOSIS — N2581 Secondary hyperparathyroidism of renal origin: Secondary | ICD-10-CM | POA: Diagnosis not present

## 2015-07-21 DIAGNOSIS — D689 Coagulation defect, unspecified: Secondary | ICD-10-CM | POA: Diagnosis not present

## 2015-07-23 DIAGNOSIS — N186 End stage renal disease: Secondary | ICD-10-CM | POA: Diagnosis not present

## 2015-07-23 DIAGNOSIS — N2581 Secondary hyperparathyroidism of renal origin: Secondary | ICD-10-CM | POA: Diagnosis not present

## 2015-07-23 DIAGNOSIS — D689 Coagulation defect, unspecified: Secondary | ICD-10-CM | POA: Diagnosis not present

## 2015-07-25 DIAGNOSIS — N186 End stage renal disease: Secondary | ICD-10-CM | POA: Diagnosis not present

## 2015-07-25 DIAGNOSIS — N2581 Secondary hyperparathyroidism of renal origin: Secondary | ICD-10-CM | POA: Diagnosis not present

## 2015-07-25 DIAGNOSIS — D689 Coagulation defect, unspecified: Secondary | ICD-10-CM | POA: Diagnosis not present

## 2015-07-27 DIAGNOSIS — N186 End stage renal disease: Secondary | ICD-10-CM | POA: Diagnosis not present

## 2015-07-27 DIAGNOSIS — N2581 Secondary hyperparathyroidism of renal origin: Secondary | ICD-10-CM | POA: Diagnosis not present

## 2015-07-27 DIAGNOSIS — D689 Coagulation defect, unspecified: Secondary | ICD-10-CM | POA: Diagnosis not present

## 2015-07-29 DIAGNOSIS — D689 Coagulation defect, unspecified: Secondary | ICD-10-CM | POA: Diagnosis not present

## 2015-07-29 DIAGNOSIS — N2581 Secondary hyperparathyroidism of renal origin: Secondary | ICD-10-CM | POA: Diagnosis not present

## 2015-07-29 DIAGNOSIS — N186 End stage renal disease: Secondary | ICD-10-CM | POA: Diagnosis not present

## 2015-07-31 DIAGNOSIS — N186 End stage renal disease: Secondary | ICD-10-CM | POA: Diagnosis not present

## 2015-07-31 DIAGNOSIS — D689 Coagulation defect, unspecified: Secondary | ICD-10-CM | POA: Diagnosis not present

## 2015-07-31 DIAGNOSIS — N2581 Secondary hyperparathyroidism of renal origin: Secondary | ICD-10-CM | POA: Diagnosis not present

## 2015-08-03 DIAGNOSIS — D689 Coagulation defect, unspecified: Secondary | ICD-10-CM | POA: Diagnosis not present

## 2015-08-03 DIAGNOSIS — N186 End stage renal disease: Secondary | ICD-10-CM | POA: Diagnosis not present

## 2015-08-03 DIAGNOSIS — N2581 Secondary hyperparathyroidism of renal origin: Secondary | ICD-10-CM | POA: Diagnosis not present

## 2015-08-04 DIAGNOSIS — N186 End stage renal disease: Secondary | ICD-10-CM | POA: Diagnosis not present

## 2015-08-04 DIAGNOSIS — F17210 Nicotine dependence, cigarettes, uncomplicated: Secondary | ICD-10-CM | POA: Diagnosis not present

## 2015-08-04 DIAGNOSIS — Z992 Dependence on renal dialysis: Secondary | ICD-10-CM | POA: Diagnosis not present

## 2015-08-04 DIAGNOSIS — I10 Essential (primary) hypertension: Secondary | ICD-10-CM | POA: Diagnosis not present

## 2015-08-04 DIAGNOSIS — E785 Hyperlipidemia, unspecified: Secondary | ICD-10-CM | POA: Diagnosis not present

## 2015-08-04 DIAGNOSIS — F419 Anxiety disorder, unspecified: Secondary | ICD-10-CM | POA: Diagnosis not present

## 2015-08-05 DIAGNOSIS — D689 Coagulation defect, unspecified: Secondary | ICD-10-CM | POA: Diagnosis not present

## 2015-08-05 DIAGNOSIS — N186 End stage renal disease: Secondary | ICD-10-CM | POA: Diagnosis not present

## 2015-08-05 DIAGNOSIS — N2581 Secondary hyperparathyroidism of renal origin: Secondary | ICD-10-CM | POA: Diagnosis not present

## 2015-08-07 DIAGNOSIS — N186 End stage renal disease: Secondary | ICD-10-CM | POA: Diagnosis not present

## 2015-08-07 DIAGNOSIS — D689 Coagulation defect, unspecified: Secondary | ICD-10-CM | POA: Diagnosis not present

## 2015-08-07 DIAGNOSIS — N2581 Secondary hyperparathyroidism of renal origin: Secondary | ICD-10-CM | POA: Diagnosis not present

## 2015-08-10 DIAGNOSIS — D689 Coagulation defect, unspecified: Secondary | ICD-10-CM | POA: Diagnosis not present

## 2015-08-10 DIAGNOSIS — N186 End stage renal disease: Secondary | ICD-10-CM | POA: Diagnosis not present

## 2015-08-10 DIAGNOSIS — N2581 Secondary hyperparathyroidism of renal origin: Secondary | ICD-10-CM | POA: Diagnosis not present

## 2015-08-12 DIAGNOSIS — D689 Coagulation defect, unspecified: Secondary | ICD-10-CM | POA: Diagnosis not present

## 2015-08-12 DIAGNOSIS — N2581 Secondary hyperparathyroidism of renal origin: Secondary | ICD-10-CM | POA: Diagnosis not present

## 2015-08-12 DIAGNOSIS — N186 End stage renal disease: Secondary | ICD-10-CM | POA: Diagnosis not present

## 2015-08-14 ENCOUNTER — Inpatient Hospital Stay: Admission: RE | Admit: 2015-08-14 | Payer: Self-pay | Source: Ambulatory Visit

## 2015-08-14 DIAGNOSIS — D689 Coagulation defect, unspecified: Secondary | ICD-10-CM | POA: Diagnosis not present

## 2015-08-14 DIAGNOSIS — N2581 Secondary hyperparathyroidism of renal origin: Secondary | ICD-10-CM | POA: Diagnosis not present

## 2015-08-14 DIAGNOSIS — N186 End stage renal disease: Secondary | ICD-10-CM | POA: Diagnosis not present

## 2015-08-17 DIAGNOSIS — I120 Hypertensive chronic kidney disease with stage 5 chronic kidney disease or end stage renal disease: Secondary | ICD-10-CM | POA: Diagnosis not present

## 2015-08-17 DIAGNOSIS — D689 Coagulation defect, unspecified: Secondary | ICD-10-CM | POA: Diagnosis not present

## 2015-08-17 DIAGNOSIS — N186 End stage renal disease: Secondary | ICD-10-CM | POA: Diagnosis not present

## 2015-08-17 DIAGNOSIS — Z992 Dependence on renal dialysis: Secondary | ICD-10-CM | POA: Diagnosis not present

## 2015-08-17 DIAGNOSIS — N2581 Secondary hyperparathyroidism of renal origin: Secondary | ICD-10-CM | POA: Diagnosis not present

## 2015-08-19 DIAGNOSIS — D689 Coagulation defect, unspecified: Secondary | ICD-10-CM | POA: Diagnosis not present

## 2015-08-19 DIAGNOSIS — N2581 Secondary hyperparathyroidism of renal origin: Secondary | ICD-10-CM | POA: Diagnosis not present

## 2015-08-19 DIAGNOSIS — D688 Other specified coagulation defects: Secondary | ICD-10-CM | POA: Diagnosis not present

## 2015-08-19 DIAGNOSIS — N186 End stage renal disease: Secondary | ICD-10-CM | POA: Diagnosis not present

## 2015-08-21 DIAGNOSIS — N2581 Secondary hyperparathyroidism of renal origin: Secondary | ICD-10-CM | POA: Diagnosis not present

## 2015-08-21 DIAGNOSIS — N186 End stage renal disease: Secondary | ICD-10-CM | POA: Diagnosis not present

## 2015-08-21 DIAGNOSIS — D688 Other specified coagulation defects: Secondary | ICD-10-CM | POA: Diagnosis not present

## 2015-08-21 DIAGNOSIS — D689 Coagulation defect, unspecified: Secondary | ICD-10-CM | POA: Diagnosis not present

## 2015-08-24 DIAGNOSIS — N186 End stage renal disease: Secondary | ICD-10-CM | POA: Diagnosis not present

## 2015-08-24 DIAGNOSIS — D689 Coagulation defect, unspecified: Secondary | ICD-10-CM | POA: Diagnosis not present

## 2015-08-24 DIAGNOSIS — N2581 Secondary hyperparathyroidism of renal origin: Secondary | ICD-10-CM | POA: Diagnosis not present

## 2015-08-24 DIAGNOSIS — D688 Other specified coagulation defects: Secondary | ICD-10-CM | POA: Diagnosis not present

## 2015-08-26 DIAGNOSIS — N2581 Secondary hyperparathyroidism of renal origin: Secondary | ICD-10-CM | POA: Diagnosis not present

## 2015-08-26 DIAGNOSIS — N186 End stage renal disease: Secondary | ICD-10-CM | POA: Diagnosis not present

## 2015-08-26 DIAGNOSIS — D688 Other specified coagulation defects: Secondary | ICD-10-CM | POA: Diagnosis not present

## 2015-08-26 DIAGNOSIS — D689 Coagulation defect, unspecified: Secondary | ICD-10-CM | POA: Diagnosis not present

## 2015-08-27 ENCOUNTER — Inpatient Hospital Stay: Admission: RE | Admit: 2015-08-27 | Payer: Self-pay | Source: Ambulatory Visit

## 2015-08-28 DIAGNOSIS — D689 Coagulation defect, unspecified: Secondary | ICD-10-CM | POA: Diagnosis not present

## 2015-08-28 DIAGNOSIS — N2581 Secondary hyperparathyroidism of renal origin: Secondary | ICD-10-CM | POA: Diagnosis not present

## 2015-08-28 DIAGNOSIS — D688 Other specified coagulation defects: Secondary | ICD-10-CM | POA: Diagnosis not present

## 2015-08-28 DIAGNOSIS — N186 End stage renal disease: Secondary | ICD-10-CM | POA: Diagnosis not present

## 2015-08-31 DIAGNOSIS — N186 End stage renal disease: Secondary | ICD-10-CM | POA: Diagnosis not present

## 2015-08-31 DIAGNOSIS — D689 Coagulation defect, unspecified: Secondary | ICD-10-CM | POA: Diagnosis not present

## 2015-08-31 DIAGNOSIS — D688 Other specified coagulation defects: Secondary | ICD-10-CM | POA: Diagnosis not present

## 2015-08-31 DIAGNOSIS — N2581 Secondary hyperparathyroidism of renal origin: Secondary | ICD-10-CM | POA: Diagnosis not present

## 2015-09-02 DIAGNOSIS — D689 Coagulation defect, unspecified: Secondary | ICD-10-CM | POA: Diagnosis not present

## 2015-09-02 DIAGNOSIS — N2581 Secondary hyperparathyroidism of renal origin: Secondary | ICD-10-CM | POA: Diagnosis not present

## 2015-09-02 DIAGNOSIS — N186 End stage renal disease: Secondary | ICD-10-CM | POA: Diagnosis not present

## 2015-09-02 DIAGNOSIS — D688 Other specified coagulation defects: Secondary | ICD-10-CM | POA: Diagnosis not present

## 2015-09-03 ENCOUNTER — Ambulatory Visit
Admission: RE | Admit: 2015-09-03 | Discharge: 2015-09-03 | Disposition: A | Discharge status: Home / Self Care | Payer: Medicare Other | Source: Ambulatory Visit | Attending: Nephrology | Admitting: Nephrology

## 2015-09-03 DIAGNOSIS — D751 Secondary polycythemia: Secondary | ICD-10-CM | POA: Diagnosis not present

## 2015-09-03 LAB — HEMOGLOBIN AND HEMATOCRIT, BLOOD
HCT: 60.7 % — ABNORMAL HIGH (ref 40.0–52.0)
Hemoglobin: 19.2 g/dL — ABNORMAL HIGH (ref 13.0–18.0)

## 2015-09-04 DIAGNOSIS — N186 End stage renal disease: Secondary | ICD-10-CM | POA: Diagnosis not present

## 2015-09-04 DIAGNOSIS — D689 Coagulation defect, unspecified: Secondary | ICD-10-CM | POA: Diagnosis not present

## 2015-09-04 DIAGNOSIS — D688 Other specified coagulation defects: Secondary | ICD-10-CM | POA: Diagnosis not present

## 2015-09-04 DIAGNOSIS — N2581 Secondary hyperparathyroidism of renal origin: Secondary | ICD-10-CM | POA: Diagnosis not present

## 2015-09-07 DIAGNOSIS — N186 End stage renal disease: Secondary | ICD-10-CM | POA: Diagnosis not present

## 2015-09-07 DIAGNOSIS — N2581 Secondary hyperparathyroidism of renal origin: Secondary | ICD-10-CM | POA: Diagnosis not present

## 2015-09-07 DIAGNOSIS — D689 Coagulation defect, unspecified: Secondary | ICD-10-CM | POA: Diagnosis not present

## 2015-09-07 DIAGNOSIS — D688 Other specified coagulation defects: Secondary | ICD-10-CM | POA: Diagnosis not present

## 2015-09-09 DIAGNOSIS — N2581 Secondary hyperparathyroidism of renal origin: Secondary | ICD-10-CM | POA: Diagnosis not present

## 2015-09-09 DIAGNOSIS — D689 Coagulation defect, unspecified: Secondary | ICD-10-CM | POA: Diagnosis not present

## 2015-09-09 DIAGNOSIS — N186 End stage renal disease: Secondary | ICD-10-CM | POA: Diagnosis not present

## 2015-09-09 DIAGNOSIS — D688 Other specified coagulation defects: Secondary | ICD-10-CM | POA: Diagnosis not present

## 2015-09-11 DIAGNOSIS — N2581 Secondary hyperparathyroidism of renal origin: Secondary | ICD-10-CM | POA: Diagnosis not present

## 2015-09-11 DIAGNOSIS — N186 End stage renal disease: Secondary | ICD-10-CM | POA: Diagnosis not present

## 2015-09-11 DIAGNOSIS — D689 Coagulation defect, unspecified: Secondary | ICD-10-CM | POA: Diagnosis not present

## 2015-09-11 DIAGNOSIS — D688 Other specified coagulation defects: Secondary | ICD-10-CM | POA: Diagnosis not present

## 2015-09-14 DIAGNOSIS — N186 End stage renal disease: Secondary | ICD-10-CM | POA: Diagnosis not present

## 2015-09-14 DIAGNOSIS — N2581 Secondary hyperparathyroidism of renal origin: Secondary | ICD-10-CM | POA: Diagnosis not present

## 2015-09-14 DIAGNOSIS — D689 Coagulation defect, unspecified: Secondary | ICD-10-CM | POA: Diagnosis not present

## 2015-09-14 DIAGNOSIS — D688 Other specified coagulation defects: Secondary | ICD-10-CM | POA: Diagnosis not present

## 2015-09-16 DIAGNOSIS — D688 Other specified coagulation defects: Secondary | ICD-10-CM | POA: Diagnosis not present

## 2015-09-16 DIAGNOSIS — N2581 Secondary hyperparathyroidism of renal origin: Secondary | ICD-10-CM | POA: Diagnosis not present

## 2015-09-16 DIAGNOSIS — N186 End stage renal disease: Secondary | ICD-10-CM | POA: Diagnosis not present

## 2015-09-16 DIAGNOSIS — D689 Coagulation defect, unspecified: Secondary | ICD-10-CM | POA: Diagnosis not present

## 2015-09-17 DIAGNOSIS — N186 End stage renal disease: Secondary | ICD-10-CM | POA: Diagnosis not present

## 2015-09-17 DIAGNOSIS — I120 Hypertensive chronic kidney disease with stage 5 chronic kidney disease or end stage renal disease: Secondary | ICD-10-CM | POA: Diagnosis not present

## 2015-09-17 DIAGNOSIS — Z992 Dependence on renal dialysis: Secondary | ICD-10-CM | POA: Diagnosis not present

## 2015-09-18 DIAGNOSIS — N2581 Secondary hyperparathyroidism of renal origin: Secondary | ICD-10-CM | POA: Diagnosis not present

## 2015-09-18 DIAGNOSIS — Z23 Encounter for immunization: Secondary | ICD-10-CM | POA: Diagnosis not present

## 2015-09-18 DIAGNOSIS — N186 End stage renal disease: Secondary | ICD-10-CM | POA: Diagnosis not present

## 2015-09-21 DIAGNOSIS — N186 End stage renal disease: Secondary | ICD-10-CM | POA: Diagnosis not present

## 2015-09-21 DIAGNOSIS — Z23 Encounter for immunization: Secondary | ICD-10-CM | POA: Diagnosis not present

## 2015-09-21 DIAGNOSIS — N2581 Secondary hyperparathyroidism of renal origin: Secondary | ICD-10-CM | POA: Diagnosis not present

## 2015-09-23 DIAGNOSIS — N186 End stage renal disease: Secondary | ICD-10-CM | POA: Diagnosis not present

## 2015-09-23 DIAGNOSIS — N2581 Secondary hyperparathyroidism of renal origin: Secondary | ICD-10-CM | POA: Diagnosis not present

## 2015-09-23 DIAGNOSIS — Z23 Encounter for immunization: Secondary | ICD-10-CM | POA: Diagnosis not present

## 2015-09-25 DIAGNOSIS — N2581 Secondary hyperparathyroidism of renal origin: Secondary | ICD-10-CM | POA: Diagnosis not present

## 2015-09-25 DIAGNOSIS — N186 End stage renal disease: Secondary | ICD-10-CM | POA: Diagnosis not present

## 2015-09-25 DIAGNOSIS — Z23 Encounter for immunization: Secondary | ICD-10-CM | POA: Diagnosis not present

## 2015-09-27 DIAGNOSIS — Z23 Encounter for immunization: Secondary | ICD-10-CM | POA: Diagnosis not present

## 2015-09-27 DIAGNOSIS — N186 End stage renal disease: Secondary | ICD-10-CM | POA: Diagnosis not present

## 2015-09-27 DIAGNOSIS — N2581 Secondary hyperparathyroidism of renal origin: Secondary | ICD-10-CM | POA: Diagnosis not present

## 2015-09-28 DIAGNOSIS — N2581 Secondary hyperparathyroidism of renal origin: Secondary | ICD-10-CM | POA: Diagnosis not present

## 2015-09-28 DIAGNOSIS — Z23 Encounter for immunization: Secondary | ICD-10-CM | POA: Diagnosis not present

## 2015-09-28 DIAGNOSIS — N186 End stage renal disease: Secondary | ICD-10-CM | POA: Diagnosis not present

## 2015-09-30 DIAGNOSIS — Z23 Encounter for immunization: Secondary | ICD-10-CM | POA: Diagnosis not present

## 2015-09-30 DIAGNOSIS — N186 End stage renal disease: Secondary | ICD-10-CM | POA: Diagnosis not present

## 2015-09-30 DIAGNOSIS — N2581 Secondary hyperparathyroidism of renal origin: Secondary | ICD-10-CM | POA: Diagnosis not present

## 2015-10-02 DIAGNOSIS — Z23 Encounter for immunization: Secondary | ICD-10-CM | POA: Diagnosis not present

## 2015-10-02 DIAGNOSIS — N2581 Secondary hyperparathyroidism of renal origin: Secondary | ICD-10-CM | POA: Diagnosis not present

## 2015-10-02 DIAGNOSIS — N186 End stage renal disease: Secondary | ICD-10-CM | POA: Diagnosis not present

## 2015-10-05 DIAGNOSIS — Z23 Encounter for immunization: Secondary | ICD-10-CM | POA: Diagnosis not present

## 2015-10-05 DIAGNOSIS — N2581 Secondary hyperparathyroidism of renal origin: Secondary | ICD-10-CM | POA: Diagnosis not present

## 2015-10-05 DIAGNOSIS — N186 End stage renal disease: Secondary | ICD-10-CM | POA: Diagnosis not present

## 2015-10-07 DIAGNOSIS — Z23 Encounter for immunization: Secondary | ICD-10-CM | POA: Diagnosis not present

## 2015-10-07 DIAGNOSIS — N2581 Secondary hyperparathyroidism of renal origin: Secondary | ICD-10-CM | POA: Diagnosis not present

## 2015-10-07 DIAGNOSIS — N186 End stage renal disease: Secondary | ICD-10-CM | POA: Diagnosis not present

## 2015-10-09 DIAGNOSIS — N186 End stage renal disease: Secondary | ICD-10-CM | POA: Diagnosis not present

## 2015-10-09 DIAGNOSIS — Z23 Encounter for immunization: Secondary | ICD-10-CM | POA: Diagnosis not present

## 2015-10-09 DIAGNOSIS — N2581 Secondary hyperparathyroidism of renal origin: Secondary | ICD-10-CM | POA: Diagnosis not present

## 2015-10-12 DIAGNOSIS — Z23 Encounter for immunization: Secondary | ICD-10-CM | POA: Diagnosis not present

## 2015-10-12 DIAGNOSIS — N186 End stage renal disease: Secondary | ICD-10-CM | POA: Diagnosis not present

## 2015-10-12 DIAGNOSIS — N2581 Secondary hyperparathyroidism of renal origin: Secondary | ICD-10-CM | POA: Diagnosis not present

## 2015-10-14 DIAGNOSIS — N186 End stage renal disease: Secondary | ICD-10-CM | POA: Diagnosis not present

## 2015-10-14 DIAGNOSIS — N2581 Secondary hyperparathyroidism of renal origin: Secondary | ICD-10-CM | POA: Diagnosis not present

## 2015-10-14 DIAGNOSIS — Z23 Encounter for immunization: Secondary | ICD-10-CM | POA: Diagnosis not present

## 2015-10-16 DIAGNOSIS — Z23 Encounter for immunization: Secondary | ICD-10-CM | POA: Diagnosis not present

## 2015-10-16 DIAGNOSIS — N186 End stage renal disease: Secondary | ICD-10-CM | POA: Diagnosis not present

## 2015-10-16 DIAGNOSIS — N2581 Secondary hyperparathyroidism of renal origin: Secondary | ICD-10-CM | POA: Diagnosis not present

## 2015-10-17 DIAGNOSIS — I120 Hypertensive chronic kidney disease with stage 5 chronic kidney disease or end stage renal disease: Secondary | ICD-10-CM | POA: Diagnosis not present

## 2015-10-17 DIAGNOSIS — Z992 Dependence on renal dialysis: Secondary | ICD-10-CM | POA: Diagnosis not present

## 2015-10-17 DIAGNOSIS — N186 End stage renal disease: Secondary | ICD-10-CM | POA: Diagnosis not present

## 2015-10-19 DIAGNOSIS — N186 End stage renal disease: Secondary | ICD-10-CM | POA: Diagnosis not present

## 2015-10-19 DIAGNOSIS — N2581 Secondary hyperparathyroidism of renal origin: Secondary | ICD-10-CM | POA: Diagnosis not present

## 2015-10-21 DIAGNOSIS — N186 End stage renal disease: Secondary | ICD-10-CM | POA: Diagnosis not present

## 2015-10-21 DIAGNOSIS — N2581 Secondary hyperparathyroidism of renal origin: Secondary | ICD-10-CM | POA: Diagnosis not present

## 2015-10-23 DIAGNOSIS — N2581 Secondary hyperparathyroidism of renal origin: Secondary | ICD-10-CM | POA: Diagnosis not present

## 2015-10-23 DIAGNOSIS — N186 End stage renal disease: Secondary | ICD-10-CM | POA: Diagnosis not present

## 2015-10-26 DIAGNOSIS — N2581 Secondary hyperparathyroidism of renal origin: Secondary | ICD-10-CM | POA: Diagnosis not present

## 2015-10-26 DIAGNOSIS — N186 End stage renal disease: Secondary | ICD-10-CM | POA: Diagnosis not present

## 2015-10-28 DIAGNOSIS — N186 End stage renal disease: Secondary | ICD-10-CM | POA: Diagnosis not present

## 2015-10-28 DIAGNOSIS — N2581 Secondary hyperparathyroidism of renal origin: Secondary | ICD-10-CM | POA: Diagnosis not present

## 2015-10-30 DIAGNOSIS — N186 End stage renal disease: Secondary | ICD-10-CM | POA: Diagnosis not present

## 2015-10-30 DIAGNOSIS — N2581 Secondary hyperparathyroidism of renal origin: Secondary | ICD-10-CM | POA: Diagnosis not present

## 2015-11-02 DIAGNOSIS — N2581 Secondary hyperparathyroidism of renal origin: Secondary | ICD-10-CM | POA: Diagnosis not present

## 2015-11-02 DIAGNOSIS — N186 End stage renal disease: Secondary | ICD-10-CM | POA: Diagnosis not present

## 2015-11-04 DIAGNOSIS — N2581 Secondary hyperparathyroidism of renal origin: Secondary | ICD-10-CM | POA: Diagnosis not present

## 2015-11-04 DIAGNOSIS — N186 End stage renal disease: Secondary | ICD-10-CM | POA: Diagnosis not present

## 2015-11-06 DIAGNOSIS — N186 End stage renal disease: Secondary | ICD-10-CM | POA: Diagnosis not present

## 2015-11-06 DIAGNOSIS — N2581 Secondary hyperparathyroidism of renal origin: Secondary | ICD-10-CM | POA: Diagnosis not present

## 2015-11-09 DIAGNOSIS — N186 End stage renal disease: Secondary | ICD-10-CM | POA: Diagnosis not present

## 2015-11-09 DIAGNOSIS — N2581 Secondary hyperparathyroidism of renal origin: Secondary | ICD-10-CM | POA: Diagnosis not present

## 2015-11-11 DIAGNOSIS — N2581 Secondary hyperparathyroidism of renal origin: Secondary | ICD-10-CM | POA: Diagnosis not present

## 2015-11-11 DIAGNOSIS — N186 End stage renal disease: Secondary | ICD-10-CM | POA: Diagnosis not present

## 2015-11-13 DIAGNOSIS — N186 End stage renal disease: Secondary | ICD-10-CM | POA: Diagnosis not present

## 2015-11-13 DIAGNOSIS — N2581 Secondary hyperparathyroidism of renal origin: Secondary | ICD-10-CM | POA: Diagnosis not present

## 2015-11-16 DIAGNOSIS — N186 End stage renal disease: Secondary | ICD-10-CM | POA: Diagnosis not present

## 2015-11-16 DIAGNOSIS — N2581 Secondary hyperparathyroidism of renal origin: Secondary | ICD-10-CM | POA: Diagnosis not present

## 2015-11-17 DIAGNOSIS — N186 End stage renal disease: Secondary | ICD-10-CM | POA: Diagnosis not present

## 2015-11-17 DIAGNOSIS — I120 Hypertensive chronic kidney disease with stage 5 chronic kidney disease or end stage renal disease: Secondary | ICD-10-CM | POA: Diagnosis not present

## 2015-11-17 DIAGNOSIS — Z992 Dependence on renal dialysis: Secondary | ICD-10-CM | POA: Diagnosis not present

## 2015-11-18 DIAGNOSIS — N2581 Secondary hyperparathyroidism of renal origin: Secondary | ICD-10-CM | POA: Diagnosis not present

## 2015-11-18 DIAGNOSIS — N186 End stage renal disease: Secondary | ICD-10-CM | POA: Diagnosis not present

## 2015-11-20 DIAGNOSIS — N2581 Secondary hyperparathyroidism of renal origin: Secondary | ICD-10-CM | POA: Diagnosis not present

## 2015-11-20 DIAGNOSIS — N186 End stage renal disease: Secondary | ICD-10-CM | POA: Diagnosis not present

## 2015-11-23 DIAGNOSIS — N186 End stage renal disease: Secondary | ICD-10-CM | POA: Diagnosis not present

## 2015-11-23 DIAGNOSIS — N2581 Secondary hyperparathyroidism of renal origin: Secondary | ICD-10-CM | POA: Diagnosis not present

## 2015-11-25 DIAGNOSIS — N2581 Secondary hyperparathyroidism of renal origin: Secondary | ICD-10-CM | POA: Diagnosis not present

## 2015-11-25 DIAGNOSIS — N186 End stage renal disease: Secondary | ICD-10-CM | POA: Diagnosis not present

## 2015-11-27 DIAGNOSIS — N186 End stage renal disease: Secondary | ICD-10-CM | POA: Diagnosis not present

## 2015-11-27 DIAGNOSIS — N2581 Secondary hyperparathyroidism of renal origin: Secondary | ICD-10-CM | POA: Diagnosis not present

## 2015-11-30 DIAGNOSIS — N186 End stage renal disease: Secondary | ICD-10-CM | POA: Diagnosis not present

## 2015-11-30 DIAGNOSIS — N2581 Secondary hyperparathyroidism of renal origin: Secondary | ICD-10-CM | POA: Diagnosis not present

## 2015-12-02 DIAGNOSIS — N186 End stage renal disease: Secondary | ICD-10-CM | POA: Diagnosis not present

## 2015-12-02 DIAGNOSIS — N2581 Secondary hyperparathyroidism of renal origin: Secondary | ICD-10-CM | POA: Diagnosis not present

## 2015-12-04 DIAGNOSIS — N2581 Secondary hyperparathyroidism of renal origin: Secondary | ICD-10-CM | POA: Diagnosis not present

## 2015-12-04 DIAGNOSIS — N186 End stage renal disease: Secondary | ICD-10-CM | POA: Diagnosis not present

## 2015-12-06 DIAGNOSIS — N186 End stage renal disease: Secondary | ICD-10-CM | POA: Diagnosis not present

## 2015-12-06 DIAGNOSIS — N2581 Secondary hyperparathyroidism of renal origin: Secondary | ICD-10-CM | POA: Diagnosis not present

## 2015-12-08 ENCOUNTER — Encounter (HOSPITAL_COMMUNITY): Payer: Self-pay

## 2015-12-08 DIAGNOSIS — N186 End stage renal disease: Secondary | ICD-10-CM | POA: Diagnosis not present

## 2015-12-08 DIAGNOSIS — N2581 Secondary hyperparathyroidism of renal origin: Secondary | ICD-10-CM | POA: Diagnosis not present

## 2015-12-11 DIAGNOSIS — N186 End stage renal disease: Secondary | ICD-10-CM | POA: Diagnosis not present

## 2015-12-11 DIAGNOSIS — N2581 Secondary hyperparathyroidism of renal origin: Secondary | ICD-10-CM | POA: Diagnosis not present

## 2015-12-14 DIAGNOSIS — N186 End stage renal disease: Secondary | ICD-10-CM | POA: Diagnosis not present

## 2015-12-14 DIAGNOSIS — N2581 Secondary hyperparathyroidism of renal origin: Secondary | ICD-10-CM | POA: Diagnosis not present

## 2015-12-16 DIAGNOSIS — N2581 Secondary hyperparathyroidism of renal origin: Secondary | ICD-10-CM | POA: Diagnosis not present

## 2015-12-16 DIAGNOSIS — N186 End stage renal disease: Secondary | ICD-10-CM | POA: Diagnosis not present

## 2015-12-17 DIAGNOSIS — Z992 Dependence on renal dialysis: Secondary | ICD-10-CM | POA: Diagnosis not present

## 2015-12-17 DIAGNOSIS — N186 End stage renal disease: Secondary | ICD-10-CM | POA: Diagnosis not present

## 2015-12-17 DIAGNOSIS — I120 Hypertensive chronic kidney disease with stage 5 chronic kidney disease or end stage renal disease: Secondary | ICD-10-CM | POA: Diagnosis not present

## 2015-12-18 DIAGNOSIS — N186 End stage renal disease: Secondary | ICD-10-CM | POA: Diagnosis not present

## 2015-12-18 DIAGNOSIS — N2581 Secondary hyperparathyroidism of renal origin: Secondary | ICD-10-CM | POA: Diagnosis not present

## 2015-12-21 DIAGNOSIS — N2581 Secondary hyperparathyroidism of renal origin: Secondary | ICD-10-CM | POA: Diagnosis not present

## 2015-12-21 DIAGNOSIS — N186 End stage renal disease: Secondary | ICD-10-CM | POA: Diagnosis not present

## 2015-12-23 DIAGNOSIS — N2581 Secondary hyperparathyroidism of renal origin: Secondary | ICD-10-CM | POA: Diagnosis not present

## 2015-12-23 DIAGNOSIS — N186 End stage renal disease: Secondary | ICD-10-CM | POA: Diagnosis not present

## 2015-12-25 DIAGNOSIS — N186 End stage renal disease: Secondary | ICD-10-CM | POA: Diagnosis not present

## 2015-12-25 DIAGNOSIS — N2581 Secondary hyperparathyroidism of renal origin: Secondary | ICD-10-CM | POA: Diagnosis not present

## 2015-12-28 DIAGNOSIS — N2581 Secondary hyperparathyroidism of renal origin: Secondary | ICD-10-CM | POA: Diagnosis not present

## 2015-12-28 DIAGNOSIS — N186 End stage renal disease: Secondary | ICD-10-CM | POA: Diagnosis not present

## 2015-12-30 DIAGNOSIS — N186 End stage renal disease: Secondary | ICD-10-CM | POA: Diagnosis not present

## 2015-12-30 DIAGNOSIS — N2581 Secondary hyperparathyroidism of renal origin: Secondary | ICD-10-CM | POA: Diagnosis not present

## 2016-01-01 DIAGNOSIS — N2581 Secondary hyperparathyroidism of renal origin: Secondary | ICD-10-CM | POA: Diagnosis not present

## 2016-01-01 DIAGNOSIS — N186 End stage renal disease: Secondary | ICD-10-CM | POA: Diagnosis not present

## 2016-01-04 DIAGNOSIS — N2581 Secondary hyperparathyroidism of renal origin: Secondary | ICD-10-CM | POA: Diagnosis not present

## 2016-01-04 DIAGNOSIS — N186 End stage renal disease: Secondary | ICD-10-CM | POA: Diagnosis not present

## 2016-01-06 DIAGNOSIS — N2581 Secondary hyperparathyroidism of renal origin: Secondary | ICD-10-CM | POA: Diagnosis not present

## 2016-01-06 DIAGNOSIS — N186 End stage renal disease: Secondary | ICD-10-CM | POA: Diagnosis not present

## 2016-01-08 DIAGNOSIS — N2581 Secondary hyperparathyroidism of renal origin: Secondary | ICD-10-CM | POA: Diagnosis not present

## 2016-01-08 DIAGNOSIS — N186 End stage renal disease: Secondary | ICD-10-CM | POA: Diagnosis not present

## 2016-01-10 DIAGNOSIS — N2581 Secondary hyperparathyroidism of renal origin: Secondary | ICD-10-CM | POA: Diagnosis not present

## 2016-01-10 DIAGNOSIS — N186 End stage renal disease: Secondary | ICD-10-CM | POA: Diagnosis not present

## 2016-01-13 DIAGNOSIS — N186 End stage renal disease: Secondary | ICD-10-CM | POA: Diagnosis not present

## 2016-01-13 DIAGNOSIS — N2581 Secondary hyperparathyroidism of renal origin: Secondary | ICD-10-CM | POA: Diagnosis not present

## 2016-01-14 DIAGNOSIS — N186 End stage renal disease: Secondary | ICD-10-CM | POA: Diagnosis not present

## 2016-01-14 DIAGNOSIS — N2581 Secondary hyperparathyroidism of renal origin: Secondary | ICD-10-CM | POA: Diagnosis not present

## 2016-01-15 DIAGNOSIS — N2581 Secondary hyperparathyroidism of renal origin: Secondary | ICD-10-CM | POA: Diagnosis not present

## 2016-01-15 DIAGNOSIS — N186 End stage renal disease: Secondary | ICD-10-CM | POA: Diagnosis not present

## 2016-01-17 DIAGNOSIS — N186 End stage renal disease: Secondary | ICD-10-CM | POA: Diagnosis not present

## 2016-01-17 DIAGNOSIS — I120 Hypertensive chronic kidney disease with stage 5 chronic kidney disease or end stage renal disease: Secondary | ICD-10-CM | POA: Diagnosis not present

## 2016-01-17 DIAGNOSIS — Z992 Dependence on renal dialysis: Secondary | ICD-10-CM | POA: Diagnosis not present

## 2016-01-17 DIAGNOSIS — N2581 Secondary hyperparathyroidism of renal origin: Secondary | ICD-10-CM | POA: Diagnosis not present

## 2016-01-21 DIAGNOSIS — N186 End stage renal disease: Secondary | ICD-10-CM | POA: Diagnosis not present

## 2016-01-21 DIAGNOSIS — D508 Other iron deficiency anemias: Secondary | ICD-10-CM | POA: Diagnosis not present

## 2016-01-21 DIAGNOSIS — N2581 Secondary hyperparathyroidism of renal origin: Secondary | ICD-10-CM | POA: Diagnosis not present

## 2016-01-22 DIAGNOSIS — N2581 Secondary hyperparathyroidism of renal origin: Secondary | ICD-10-CM | POA: Diagnosis not present

## 2016-01-22 DIAGNOSIS — N186 End stage renal disease: Secondary | ICD-10-CM | POA: Diagnosis not present

## 2016-01-22 DIAGNOSIS — D508 Other iron deficiency anemias: Secondary | ICD-10-CM | POA: Diagnosis not present

## 2016-01-25 DIAGNOSIS — D508 Other iron deficiency anemias: Secondary | ICD-10-CM | POA: Diagnosis not present

## 2016-01-25 DIAGNOSIS — N2581 Secondary hyperparathyroidism of renal origin: Secondary | ICD-10-CM | POA: Diagnosis not present

## 2016-01-25 DIAGNOSIS — N186 End stage renal disease: Secondary | ICD-10-CM | POA: Diagnosis not present

## 2016-01-28 DIAGNOSIS — N2581 Secondary hyperparathyroidism of renal origin: Secondary | ICD-10-CM | POA: Diagnosis not present

## 2016-01-28 DIAGNOSIS — N186 End stage renal disease: Secondary | ICD-10-CM | POA: Diagnosis not present

## 2016-01-28 DIAGNOSIS — D508 Other iron deficiency anemias: Secondary | ICD-10-CM | POA: Diagnosis not present

## 2016-01-29 DIAGNOSIS — D508 Other iron deficiency anemias: Secondary | ICD-10-CM | POA: Diagnosis not present

## 2016-01-29 DIAGNOSIS — N186 End stage renal disease: Secondary | ICD-10-CM | POA: Diagnosis not present

## 2016-01-29 DIAGNOSIS — N2581 Secondary hyperparathyroidism of renal origin: Secondary | ICD-10-CM | POA: Diagnosis not present

## 2016-02-01 DIAGNOSIS — D508 Other iron deficiency anemias: Secondary | ICD-10-CM | POA: Diagnosis not present

## 2016-02-01 DIAGNOSIS — N186 End stage renal disease: Secondary | ICD-10-CM | POA: Diagnosis not present

## 2016-02-01 DIAGNOSIS — N2581 Secondary hyperparathyroidism of renal origin: Secondary | ICD-10-CM | POA: Diagnosis not present

## 2016-02-03 DIAGNOSIS — N2581 Secondary hyperparathyroidism of renal origin: Secondary | ICD-10-CM | POA: Diagnosis not present

## 2016-02-03 DIAGNOSIS — N186 End stage renal disease: Secondary | ICD-10-CM | POA: Diagnosis not present

## 2016-02-03 DIAGNOSIS — D508 Other iron deficiency anemias: Secondary | ICD-10-CM | POA: Diagnosis not present

## 2016-02-05 DIAGNOSIS — N2581 Secondary hyperparathyroidism of renal origin: Secondary | ICD-10-CM | POA: Diagnosis not present

## 2016-02-05 DIAGNOSIS — D508 Other iron deficiency anemias: Secondary | ICD-10-CM | POA: Diagnosis not present

## 2016-02-05 DIAGNOSIS — N186 End stage renal disease: Secondary | ICD-10-CM | POA: Diagnosis not present

## 2016-02-06 DIAGNOSIS — N2581 Secondary hyperparathyroidism of renal origin: Secondary | ICD-10-CM | POA: Diagnosis not present

## 2016-02-06 DIAGNOSIS — D508 Other iron deficiency anemias: Secondary | ICD-10-CM | POA: Diagnosis not present

## 2016-02-06 DIAGNOSIS — N186 End stage renal disease: Secondary | ICD-10-CM | POA: Diagnosis not present

## 2016-02-08 DIAGNOSIS — N2581 Secondary hyperparathyroidism of renal origin: Secondary | ICD-10-CM | POA: Diagnosis not present

## 2016-02-08 DIAGNOSIS — N186 End stage renal disease: Secondary | ICD-10-CM | POA: Diagnosis not present

## 2016-02-08 DIAGNOSIS — D508 Other iron deficiency anemias: Secondary | ICD-10-CM | POA: Diagnosis not present

## 2016-02-10 DIAGNOSIS — N2581 Secondary hyperparathyroidism of renal origin: Secondary | ICD-10-CM | POA: Diagnosis not present

## 2016-02-10 DIAGNOSIS — N186 End stage renal disease: Secondary | ICD-10-CM | POA: Diagnosis not present

## 2016-02-10 DIAGNOSIS — D508 Other iron deficiency anemias: Secondary | ICD-10-CM | POA: Diagnosis not present

## 2016-02-12 DIAGNOSIS — D508 Other iron deficiency anemias: Secondary | ICD-10-CM | POA: Diagnosis not present

## 2016-02-12 DIAGNOSIS — N2581 Secondary hyperparathyroidism of renal origin: Secondary | ICD-10-CM | POA: Diagnosis not present

## 2016-02-12 DIAGNOSIS — N186 End stage renal disease: Secondary | ICD-10-CM | POA: Diagnosis not present

## 2016-02-15 DIAGNOSIS — N2581 Secondary hyperparathyroidism of renal origin: Secondary | ICD-10-CM | POA: Diagnosis not present

## 2016-02-15 DIAGNOSIS — N186 End stage renal disease: Secondary | ICD-10-CM | POA: Diagnosis not present

## 2016-02-15 DIAGNOSIS — D508 Other iron deficiency anemias: Secondary | ICD-10-CM | POA: Diagnosis not present

## 2016-02-17 DIAGNOSIS — N186 End stage renal disease: Secondary | ICD-10-CM | POA: Diagnosis not present

## 2016-02-17 DIAGNOSIS — N2581 Secondary hyperparathyroidism of renal origin: Secondary | ICD-10-CM | POA: Diagnosis not present

## 2016-02-17 DIAGNOSIS — I120 Hypertensive chronic kidney disease with stage 5 chronic kidney disease or end stage renal disease: Secondary | ICD-10-CM | POA: Diagnosis not present

## 2016-02-17 DIAGNOSIS — D508 Other iron deficiency anemias: Secondary | ICD-10-CM | POA: Diagnosis not present

## 2016-02-17 DIAGNOSIS — Z992 Dependence on renal dialysis: Secondary | ICD-10-CM | POA: Diagnosis not present

## 2016-02-19 DIAGNOSIS — N186 End stage renal disease: Secondary | ICD-10-CM | POA: Diagnosis not present

## 2016-02-19 DIAGNOSIS — N2581 Secondary hyperparathyroidism of renal origin: Secondary | ICD-10-CM | POA: Diagnosis not present

## 2016-02-19 DIAGNOSIS — D508 Other iron deficiency anemias: Secondary | ICD-10-CM | POA: Diagnosis not present

## 2016-02-22 DIAGNOSIS — N2581 Secondary hyperparathyroidism of renal origin: Secondary | ICD-10-CM | POA: Diagnosis not present

## 2016-02-22 DIAGNOSIS — N186 End stage renal disease: Secondary | ICD-10-CM | POA: Diagnosis not present

## 2016-02-22 DIAGNOSIS — D508 Other iron deficiency anemias: Secondary | ICD-10-CM | POA: Diagnosis not present

## 2016-02-24 DIAGNOSIS — D508 Other iron deficiency anemias: Secondary | ICD-10-CM | POA: Diagnosis not present

## 2016-02-24 DIAGNOSIS — N2581 Secondary hyperparathyroidism of renal origin: Secondary | ICD-10-CM | POA: Diagnosis not present

## 2016-02-24 DIAGNOSIS — N186 End stage renal disease: Secondary | ICD-10-CM | POA: Diagnosis not present

## 2016-02-26 DIAGNOSIS — N2581 Secondary hyperparathyroidism of renal origin: Secondary | ICD-10-CM | POA: Diagnosis not present

## 2016-02-26 DIAGNOSIS — D508 Other iron deficiency anemias: Secondary | ICD-10-CM | POA: Diagnosis not present

## 2016-02-26 DIAGNOSIS — N186 End stage renal disease: Secondary | ICD-10-CM | POA: Diagnosis not present

## 2016-02-29 DIAGNOSIS — D508 Other iron deficiency anemias: Secondary | ICD-10-CM | POA: Diagnosis not present

## 2016-02-29 DIAGNOSIS — N2581 Secondary hyperparathyroidism of renal origin: Secondary | ICD-10-CM | POA: Diagnosis not present

## 2016-02-29 DIAGNOSIS — N186 End stage renal disease: Secondary | ICD-10-CM | POA: Diagnosis not present

## 2016-03-02 DIAGNOSIS — N186 End stage renal disease: Secondary | ICD-10-CM | POA: Diagnosis not present

## 2016-03-02 DIAGNOSIS — D508 Other iron deficiency anemias: Secondary | ICD-10-CM | POA: Diagnosis not present

## 2016-03-02 DIAGNOSIS — N2581 Secondary hyperparathyroidism of renal origin: Secondary | ICD-10-CM | POA: Diagnosis not present

## 2016-03-04 DIAGNOSIS — N186 End stage renal disease: Secondary | ICD-10-CM | POA: Diagnosis not present

## 2016-03-04 DIAGNOSIS — N2581 Secondary hyperparathyroidism of renal origin: Secondary | ICD-10-CM | POA: Diagnosis not present

## 2016-03-04 DIAGNOSIS — D508 Other iron deficiency anemias: Secondary | ICD-10-CM | POA: Diagnosis not present

## 2016-03-07 DIAGNOSIS — D508 Other iron deficiency anemias: Secondary | ICD-10-CM | POA: Diagnosis not present

## 2016-03-07 DIAGNOSIS — N186 End stage renal disease: Secondary | ICD-10-CM | POA: Diagnosis not present

## 2016-03-07 DIAGNOSIS — N2581 Secondary hyperparathyroidism of renal origin: Secondary | ICD-10-CM | POA: Diagnosis not present

## 2016-03-09 DIAGNOSIS — N186 End stage renal disease: Secondary | ICD-10-CM | POA: Diagnosis not present

## 2016-03-09 DIAGNOSIS — D508 Other iron deficiency anemias: Secondary | ICD-10-CM | POA: Diagnosis not present

## 2016-03-09 DIAGNOSIS — N2581 Secondary hyperparathyroidism of renal origin: Secondary | ICD-10-CM | POA: Diagnosis not present

## 2016-03-11 DIAGNOSIS — N186 End stage renal disease: Secondary | ICD-10-CM | POA: Diagnosis not present

## 2016-03-11 DIAGNOSIS — D508 Other iron deficiency anemias: Secondary | ICD-10-CM | POA: Diagnosis not present

## 2016-03-11 DIAGNOSIS — N2581 Secondary hyperparathyroidism of renal origin: Secondary | ICD-10-CM | POA: Diagnosis not present

## 2016-03-14 DIAGNOSIS — N2581 Secondary hyperparathyroidism of renal origin: Secondary | ICD-10-CM | POA: Diagnosis not present

## 2016-03-14 DIAGNOSIS — N186 End stage renal disease: Secondary | ICD-10-CM | POA: Diagnosis not present

## 2016-03-14 DIAGNOSIS — D508 Other iron deficiency anemias: Secondary | ICD-10-CM | POA: Diagnosis not present

## 2016-03-16 DIAGNOSIS — Z992 Dependence on renal dialysis: Secondary | ICD-10-CM | POA: Diagnosis not present

## 2016-03-16 DIAGNOSIS — N186 End stage renal disease: Secondary | ICD-10-CM | POA: Diagnosis not present

## 2016-03-16 DIAGNOSIS — I120 Hypertensive chronic kidney disease with stage 5 chronic kidney disease or end stage renal disease: Secondary | ICD-10-CM | POA: Diagnosis not present

## 2016-03-17 DIAGNOSIS — N186 End stage renal disease: Secondary | ICD-10-CM | POA: Diagnosis not present

## 2016-03-17 DIAGNOSIS — D689 Coagulation defect, unspecified: Secondary | ICD-10-CM | POA: Diagnosis not present

## 2016-03-17 DIAGNOSIS — N2581 Secondary hyperparathyroidism of renal origin: Secondary | ICD-10-CM | POA: Diagnosis not present

## 2016-03-19 DIAGNOSIS — N186 End stage renal disease: Secondary | ICD-10-CM | POA: Diagnosis not present

## 2016-03-19 DIAGNOSIS — D689 Coagulation defect, unspecified: Secondary | ICD-10-CM | POA: Diagnosis not present

## 2016-03-19 DIAGNOSIS — N2581 Secondary hyperparathyroidism of renal origin: Secondary | ICD-10-CM | POA: Diagnosis not present

## 2016-03-21 ENCOUNTER — Other Ambulatory Visit (INDEPENDENT_AMBULATORY_CARE_PROVIDER_SITE_OTHER): Payer: Self-pay | Admitting: Vascular Surgery

## 2016-03-21 ENCOUNTER — Telehealth (INDEPENDENT_AMBULATORY_CARE_PROVIDER_SITE_OTHER): Payer: Self-pay

## 2016-03-22 DIAGNOSIS — D689 Coagulation defect, unspecified: Secondary | ICD-10-CM | POA: Diagnosis not present

## 2016-03-22 DIAGNOSIS — N2581 Secondary hyperparathyroidism of renal origin: Secondary | ICD-10-CM | POA: Diagnosis not present

## 2016-03-22 DIAGNOSIS — N186 End stage renal disease: Secondary | ICD-10-CM | POA: Diagnosis not present

## 2016-03-24 DIAGNOSIS — N186 End stage renal disease: Secondary | ICD-10-CM | POA: Diagnosis not present

## 2016-03-24 DIAGNOSIS — D689 Coagulation defect, unspecified: Secondary | ICD-10-CM | POA: Diagnosis not present

## 2016-03-24 DIAGNOSIS — N2581 Secondary hyperparathyroidism of renal origin: Secondary | ICD-10-CM | POA: Diagnosis not present

## 2016-03-26 DIAGNOSIS — N2581 Secondary hyperparathyroidism of renal origin: Secondary | ICD-10-CM | POA: Diagnosis not present

## 2016-03-26 DIAGNOSIS — N186 End stage renal disease: Secondary | ICD-10-CM | POA: Diagnosis not present

## 2016-03-26 DIAGNOSIS — D689 Coagulation defect, unspecified: Secondary | ICD-10-CM | POA: Diagnosis not present

## 2016-03-29 DIAGNOSIS — N2581 Secondary hyperparathyroidism of renal origin: Secondary | ICD-10-CM | POA: Diagnosis not present

## 2016-03-29 DIAGNOSIS — D689 Coagulation defect, unspecified: Secondary | ICD-10-CM | POA: Diagnosis not present

## 2016-03-29 DIAGNOSIS — N186 End stage renal disease: Secondary | ICD-10-CM | POA: Diagnosis not present

## 2016-03-30 DIAGNOSIS — N2581 Secondary hyperparathyroidism of renal origin: Secondary | ICD-10-CM | POA: Diagnosis not present

## 2016-03-30 DIAGNOSIS — N186 End stage renal disease: Secondary | ICD-10-CM | POA: Diagnosis not present

## 2016-03-30 DIAGNOSIS — D689 Coagulation defect, unspecified: Secondary | ICD-10-CM | POA: Diagnosis not present

## 2016-03-31 DIAGNOSIS — N2581 Secondary hyperparathyroidism of renal origin: Secondary | ICD-10-CM | POA: Diagnosis not present

## 2016-03-31 DIAGNOSIS — D689 Coagulation defect, unspecified: Secondary | ICD-10-CM | POA: Diagnosis not present

## 2016-03-31 DIAGNOSIS — N186 End stage renal disease: Secondary | ICD-10-CM | POA: Diagnosis not present

## 2016-04-02 DIAGNOSIS — D689 Coagulation defect, unspecified: Secondary | ICD-10-CM | POA: Diagnosis not present

## 2016-04-02 DIAGNOSIS — N186 End stage renal disease: Secondary | ICD-10-CM | POA: Diagnosis not present

## 2016-04-02 DIAGNOSIS — N2581 Secondary hyperparathyroidism of renal origin: Secondary | ICD-10-CM | POA: Diagnosis not present

## 2016-04-05 DIAGNOSIS — N2581 Secondary hyperparathyroidism of renal origin: Secondary | ICD-10-CM | POA: Diagnosis not present

## 2016-04-05 DIAGNOSIS — N186 End stage renal disease: Secondary | ICD-10-CM | POA: Diagnosis not present

## 2016-04-05 DIAGNOSIS — D689 Coagulation defect, unspecified: Secondary | ICD-10-CM | POA: Diagnosis not present

## 2016-04-07 DIAGNOSIS — N2581 Secondary hyperparathyroidism of renal origin: Secondary | ICD-10-CM | POA: Diagnosis not present

## 2016-04-07 DIAGNOSIS — N186 End stage renal disease: Secondary | ICD-10-CM | POA: Diagnosis not present

## 2016-04-07 DIAGNOSIS — D689 Coagulation defect, unspecified: Secondary | ICD-10-CM | POA: Diagnosis not present

## 2016-04-08 DIAGNOSIS — D689 Coagulation defect, unspecified: Secondary | ICD-10-CM | POA: Diagnosis not present

## 2016-04-08 DIAGNOSIS — N186 End stage renal disease: Secondary | ICD-10-CM | POA: Diagnosis not present

## 2016-04-08 DIAGNOSIS — N2581 Secondary hyperparathyroidism of renal origin: Secondary | ICD-10-CM | POA: Diagnosis not present

## 2016-04-09 DIAGNOSIS — N186 End stage renal disease: Secondary | ICD-10-CM | POA: Diagnosis not present

## 2016-04-09 DIAGNOSIS — D689 Coagulation defect, unspecified: Secondary | ICD-10-CM | POA: Diagnosis not present

## 2016-04-09 DIAGNOSIS — N2581 Secondary hyperparathyroidism of renal origin: Secondary | ICD-10-CM | POA: Diagnosis not present

## 2016-04-12 DIAGNOSIS — N186 End stage renal disease: Secondary | ICD-10-CM | POA: Diagnosis not present

## 2016-04-12 DIAGNOSIS — N2581 Secondary hyperparathyroidism of renal origin: Secondary | ICD-10-CM | POA: Diagnosis not present

## 2016-04-12 DIAGNOSIS — D689 Coagulation defect, unspecified: Secondary | ICD-10-CM | POA: Diagnosis not present

## 2016-04-14 DIAGNOSIS — D689 Coagulation defect, unspecified: Secondary | ICD-10-CM | POA: Diagnosis not present

## 2016-04-14 DIAGNOSIS — N186 End stage renal disease: Secondary | ICD-10-CM | POA: Diagnosis not present

## 2016-04-14 DIAGNOSIS — N2581 Secondary hyperparathyroidism of renal origin: Secondary | ICD-10-CM | POA: Diagnosis not present

## 2016-04-16 DIAGNOSIS — I120 Hypertensive chronic kidney disease with stage 5 chronic kidney disease or end stage renal disease: Secondary | ICD-10-CM | POA: Diagnosis not present

## 2016-04-16 DIAGNOSIS — N186 End stage renal disease: Secondary | ICD-10-CM | POA: Diagnosis not present

## 2016-04-16 DIAGNOSIS — N2581 Secondary hyperparathyroidism of renal origin: Secondary | ICD-10-CM | POA: Diagnosis not present

## 2016-04-16 DIAGNOSIS — Z992 Dependence on renal dialysis: Secondary | ICD-10-CM | POA: Diagnosis not present

## 2016-04-16 DIAGNOSIS — D689 Coagulation defect, unspecified: Secondary | ICD-10-CM | POA: Diagnosis not present

## 2016-04-19 DIAGNOSIS — N186 End stage renal disease: Secondary | ICD-10-CM | POA: Diagnosis not present

## 2016-04-19 DIAGNOSIS — D500 Iron deficiency anemia secondary to blood loss (chronic): Secondary | ICD-10-CM | POA: Diagnosis not present

## 2016-04-19 DIAGNOSIS — N2581 Secondary hyperparathyroidism of renal origin: Secondary | ICD-10-CM | POA: Diagnosis not present

## 2016-04-21 DIAGNOSIS — D500 Iron deficiency anemia secondary to blood loss (chronic): Secondary | ICD-10-CM | POA: Diagnosis not present

## 2016-04-21 DIAGNOSIS — N2581 Secondary hyperparathyroidism of renal origin: Secondary | ICD-10-CM | POA: Diagnosis not present

## 2016-04-21 DIAGNOSIS — N186 End stage renal disease: Secondary | ICD-10-CM | POA: Diagnosis not present

## 2016-04-23 DIAGNOSIS — D500 Iron deficiency anemia secondary to blood loss (chronic): Secondary | ICD-10-CM | POA: Diagnosis not present

## 2016-04-23 DIAGNOSIS — N2581 Secondary hyperparathyroidism of renal origin: Secondary | ICD-10-CM | POA: Diagnosis not present

## 2016-04-23 DIAGNOSIS — N186 End stage renal disease: Secondary | ICD-10-CM | POA: Diagnosis not present

## 2016-04-26 DIAGNOSIS — N2581 Secondary hyperparathyroidism of renal origin: Secondary | ICD-10-CM | POA: Diagnosis not present

## 2016-04-26 DIAGNOSIS — N186 End stage renal disease: Secondary | ICD-10-CM | POA: Diagnosis not present

## 2016-04-26 DIAGNOSIS — D500 Iron deficiency anemia secondary to blood loss (chronic): Secondary | ICD-10-CM | POA: Diagnosis not present

## 2016-04-28 DIAGNOSIS — N186 End stage renal disease: Secondary | ICD-10-CM | POA: Diagnosis not present

## 2016-04-28 DIAGNOSIS — D500 Iron deficiency anemia secondary to blood loss (chronic): Secondary | ICD-10-CM | POA: Diagnosis not present

## 2016-04-28 DIAGNOSIS — N2581 Secondary hyperparathyroidism of renal origin: Secondary | ICD-10-CM | POA: Diagnosis not present

## 2016-04-30 DIAGNOSIS — N2581 Secondary hyperparathyroidism of renal origin: Secondary | ICD-10-CM | POA: Diagnosis not present

## 2016-04-30 DIAGNOSIS — N186 End stage renal disease: Secondary | ICD-10-CM | POA: Diagnosis not present

## 2016-04-30 DIAGNOSIS — D500 Iron deficiency anemia secondary to blood loss (chronic): Secondary | ICD-10-CM | POA: Diagnosis not present

## 2016-05-03 DIAGNOSIS — N186 End stage renal disease: Secondary | ICD-10-CM | POA: Diagnosis not present

## 2016-05-03 DIAGNOSIS — N2581 Secondary hyperparathyroidism of renal origin: Secondary | ICD-10-CM | POA: Diagnosis not present

## 2016-05-03 DIAGNOSIS — D500 Iron deficiency anemia secondary to blood loss (chronic): Secondary | ICD-10-CM | POA: Diagnosis not present

## 2016-05-05 DIAGNOSIS — N186 End stage renal disease: Secondary | ICD-10-CM | POA: Diagnosis not present

## 2016-05-05 DIAGNOSIS — N2581 Secondary hyperparathyroidism of renal origin: Secondary | ICD-10-CM | POA: Diagnosis not present

## 2016-05-05 DIAGNOSIS — D500 Iron deficiency anemia secondary to blood loss (chronic): Secondary | ICD-10-CM | POA: Diagnosis not present

## 2016-05-07 DIAGNOSIS — N186 End stage renal disease: Secondary | ICD-10-CM | POA: Diagnosis not present

## 2016-05-07 DIAGNOSIS — D500 Iron deficiency anemia secondary to blood loss (chronic): Secondary | ICD-10-CM | POA: Diagnosis not present

## 2016-05-07 DIAGNOSIS — N2581 Secondary hyperparathyroidism of renal origin: Secondary | ICD-10-CM | POA: Diagnosis not present

## 2016-05-10 DIAGNOSIS — N2581 Secondary hyperparathyroidism of renal origin: Secondary | ICD-10-CM | POA: Diagnosis not present

## 2016-05-10 DIAGNOSIS — N186 End stage renal disease: Secondary | ICD-10-CM | POA: Diagnosis not present

## 2016-05-10 DIAGNOSIS — D500 Iron deficiency anemia secondary to blood loss (chronic): Secondary | ICD-10-CM | POA: Diagnosis not present

## 2016-05-12 DIAGNOSIS — N186 End stage renal disease: Secondary | ICD-10-CM | POA: Diagnosis not present

## 2016-05-12 DIAGNOSIS — N2581 Secondary hyperparathyroidism of renal origin: Secondary | ICD-10-CM | POA: Diagnosis not present

## 2016-05-12 DIAGNOSIS — D500 Iron deficiency anemia secondary to blood loss (chronic): Secondary | ICD-10-CM | POA: Diagnosis not present

## 2016-05-13 DIAGNOSIS — N186 End stage renal disease: Secondary | ICD-10-CM | POA: Diagnosis not present

## 2016-05-13 DIAGNOSIS — T8249XA Other complication of vascular dialysis catheter, initial encounter: Secondary | ICD-10-CM | POA: Diagnosis not present

## 2016-05-13 DIAGNOSIS — Z992 Dependence on renal dialysis: Secondary | ICD-10-CM | POA: Diagnosis not present

## 2016-05-14 DIAGNOSIS — N186 End stage renal disease: Secondary | ICD-10-CM | POA: Diagnosis not present

## 2016-05-14 DIAGNOSIS — D500 Iron deficiency anemia secondary to blood loss (chronic): Secondary | ICD-10-CM | POA: Diagnosis not present

## 2016-05-14 DIAGNOSIS — N2581 Secondary hyperparathyroidism of renal origin: Secondary | ICD-10-CM | POA: Diagnosis not present

## 2016-05-16 DIAGNOSIS — I120 Hypertensive chronic kidney disease with stage 5 chronic kidney disease or end stage renal disease: Secondary | ICD-10-CM | POA: Diagnosis not present

## 2016-05-16 DIAGNOSIS — N186 End stage renal disease: Secondary | ICD-10-CM | POA: Diagnosis not present

## 2016-05-16 DIAGNOSIS — Z992 Dependence on renal dialysis: Secondary | ICD-10-CM | POA: Diagnosis not present

## 2016-05-17 DIAGNOSIS — N186 End stage renal disease: Secondary | ICD-10-CM | POA: Diagnosis not present

## 2016-05-17 DIAGNOSIS — N2581 Secondary hyperparathyroidism of renal origin: Secondary | ICD-10-CM | POA: Diagnosis not present

## 2016-05-20 DIAGNOSIS — N186 End stage renal disease: Secondary | ICD-10-CM | POA: Diagnosis not present

## 2016-05-20 DIAGNOSIS — N2581 Secondary hyperparathyroidism of renal origin: Secondary | ICD-10-CM | POA: Diagnosis not present

## 2016-05-21 DIAGNOSIS — N2581 Secondary hyperparathyroidism of renal origin: Secondary | ICD-10-CM | POA: Diagnosis not present

## 2016-05-21 DIAGNOSIS — N186 End stage renal disease: Secondary | ICD-10-CM | POA: Diagnosis not present

## 2016-05-24 DIAGNOSIS — N2581 Secondary hyperparathyroidism of renal origin: Secondary | ICD-10-CM | POA: Diagnosis not present

## 2016-05-24 DIAGNOSIS — N186 End stage renal disease: Secondary | ICD-10-CM | POA: Diagnosis not present

## 2016-05-26 DIAGNOSIS — N186 End stage renal disease: Secondary | ICD-10-CM | POA: Diagnosis not present

## 2016-05-26 DIAGNOSIS — N2581 Secondary hyperparathyroidism of renal origin: Secondary | ICD-10-CM | POA: Diagnosis not present

## 2016-05-28 DIAGNOSIS — N186 End stage renal disease: Secondary | ICD-10-CM | POA: Diagnosis not present

## 2016-05-28 DIAGNOSIS — N2581 Secondary hyperparathyroidism of renal origin: Secondary | ICD-10-CM | POA: Diagnosis not present

## 2016-06-01 DIAGNOSIS — N186 End stage renal disease: Secondary | ICD-10-CM | POA: Diagnosis not present

## 2016-06-01 DIAGNOSIS — N2581 Secondary hyperparathyroidism of renal origin: Secondary | ICD-10-CM | POA: Diagnosis not present

## 2016-06-02 DIAGNOSIS — N186 End stage renal disease: Secondary | ICD-10-CM | POA: Diagnosis not present

## 2016-06-02 DIAGNOSIS — N2581 Secondary hyperparathyroidism of renal origin: Secondary | ICD-10-CM | POA: Diagnosis not present

## 2016-06-04 DIAGNOSIS — N186 End stage renal disease: Secondary | ICD-10-CM | POA: Diagnosis not present

## 2016-06-04 DIAGNOSIS — N2581 Secondary hyperparathyroidism of renal origin: Secondary | ICD-10-CM | POA: Diagnosis not present

## 2016-06-07 DIAGNOSIS — N186 End stage renal disease: Secondary | ICD-10-CM | POA: Diagnosis not present

## 2016-06-07 DIAGNOSIS — N2581 Secondary hyperparathyroidism of renal origin: Secondary | ICD-10-CM | POA: Diagnosis not present

## 2016-06-09 DIAGNOSIS — N186 End stage renal disease: Secondary | ICD-10-CM | POA: Diagnosis not present

## 2016-06-09 DIAGNOSIS — N2581 Secondary hyperparathyroidism of renal origin: Secondary | ICD-10-CM | POA: Diagnosis not present

## 2016-06-11 DIAGNOSIS — N2581 Secondary hyperparathyroidism of renal origin: Secondary | ICD-10-CM | POA: Diagnosis not present

## 2016-06-11 DIAGNOSIS — N186 End stage renal disease: Secondary | ICD-10-CM | POA: Diagnosis not present

## 2016-06-14 DIAGNOSIS — N2581 Secondary hyperparathyroidism of renal origin: Secondary | ICD-10-CM | POA: Diagnosis not present

## 2016-06-14 DIAGNOSIS — N186 End stage renal disease: Secondary | ICD-10-CM | POA: Diagnosis not present

## 2016-06-16 DIAGNOSIS — N2581 Secondary hyperparathyroidism of renal origin: Secondary | ICD-10-CM | POA: Diagnosis not present

## 2016-06-16 DIAGNOSIS — N186 End stage renal disease: Secondary | ICD-10-CM | POA: Diagnosis not present

## 2016-06-16 DIAGNOSIS — I120 Hypertensive chronic kidney disease with stage 5 chronic kidney disease or end stage renal disease: Secondary | ICD-10-CM | POA: Diagnosis not present

## 2016-06-16 DIAGNOSIS — Z992 Dependence on renal dialysis: Secondary | ICD-10-CM | POA: Diagnosis not present

## 2016-06-18 DIAGNOSIS — N2581 Secondary hyperparathyroidism of renal origin: Secondary | ICD-10-CM | POA: Diagnosis not present

## 2016-06-18 DIAGNOSIS — E1129 Type 2 diabetes mellitus with other diabetic kidney complication: Secondary | ICD-10-CM | POA: Diagnosis not present

## 2016-06-18 DIAGNOSIS — N186 End stage renal disease: Secondary | ICD-10-CM | POA: Diagnosis not present

## 2016-06-21 DIAGNOSIS — N186 End stage renal disease: Secondary | ICD-10-CM | POA: Diagnosis not present

## 2016-06-21 DIAGNOSIS — E1129 Type 2 diabetes mellitus with other diabetic kidney complication: Secondary | ICD-10-CM | POA: Diagnosis not present

## 2016-06-21 DIAGNOSIS — N2581 Secondary hyperparathyroidism of renal origin: Secondary | ICD-10-CM | POA: Diagnosis not present

## 2016-06-22 DIAGNOSIS — N2581 Secondary hyperparathyroidism of renal origin: Secondary | ICD-10-CM | POA: Diagnosis not present

## 2016-06-22 DIAGNOSIS — N186 End stage renal disease: Secondary | ICD-10-CM | POA: Diagnosis not present

## 2016-06-22 DIAGNOSIS — E1129 Type 2 diabetes mellitus with other diabetic kidney complication: Secondary | ICD-10-CM | POA: Diagnosis not present

## 2016-06-24 DIAGNOSIS — E1129 Type 2 diabetes mellitus with other diabetic kidney complication: Secondary | ICD-10-CM | POA: Diagnosis not present

## 2016-06-24 DIAGNOSIS — N186 End stage renal disease: Secondary | ICD-10-CM | POA: Diagnosis not present

## 2016-06-24 DIAGNOSIS — N2581 Secondary hyperparathyroidism of renal origin: Secondary | ICD-10-CM | POA: Diagnosis not present

## 2016-06-27 DIAGNOSIS — N186 End stage renal disease: Secondary | ICD-10-CM | POA: Diagnosis not present

## 2016-06-27 DIAGNOSIS — E1129 Type 2 diabetes mellitus with other diabetic kidney complication: Secondary | ICD-10-CM | POA: Diagnosis not present

## 2016-06-27 DIAGNOSIS — N2581 Secondary hyperparathyroidism of renal origin: Secondary | ICD-10-CM | POA: Diagnosis not present

## 2016-06-30 DIAGNOSIS — N186 End stage renal disease: Secondary | ICD-10-CM | POA: Diagnosis not present

## 2016-06-30 DIAGNOSIS — E1129 Type 2 diabetes mellitus with other diabetic kidney complication: Secondary | ICD-10-CM | POA: Diagnosis not present

## 2016-06-30 DIAGNOSIS — N2581 Secondary hyperparathyroidism of renal origin: Secondary | ICD-10-CM | POA: Diagnosis not present

## 2016-07-01 DIAGNOSIS — N2581 Secondary hyperparathyroidism of renal origin: Secondary | ICD-10-CM | POA: Diagnosis not present

## 2016-07-01 DIAGNOSIS — E1129 Type 2 diabetes mellitus with other diabetic kidney complication: Secondary | ICD-10-CM | POA: Diagnosis not present

## 2016-07-01 DIAGNOSIS — N186 End stage renal disease: Secondary | ICD-10-CM | POA: Diagnosis not present

## 2016-07-04 DIAGNOSIS — E1129 Type 2 diabetes mellitus with other diabetic kidney complication: Secondary | ICD-10-CM | POA: Diagnosis not present

## 2016-07-04 DIAGNOSIS — N2581 Secondary hyperparathyroidism of renal origin: Secondary | ICD-10-CM | POA: Diagnosis not present

## 2016-07-04 DIAGNOSIS — N186 End stage renal disease: Secondary | ICD-10-CM | POA: Diagnosis not present

## 2016-07-06 DIAGNOSIS — N186 End stage renal disease: Secondary | ICD-10-CM | POA: Diagnosis not present

## 2016-07-06 DIAGNOSIS — E1129 Type 2 diabetes mellitus with other diabetic kidney complication: Secondary | ICD-10-CM | POA: Diagnosis not present

## 2016-07-06 DIAGNOSIS — N2581 Secondary hyperparathyroidism of renal origin: Secondary | ICD-10-CM | POA: Diagnosis not present

## 2016-07-08 DIAGNOSIS — N186 End stage renal disease: Secondary | ICD-10-CM | POA: Diagnosis not present

## 2016-07-08 DIAGNOSIS — E1129 Type 2 diabetes mellitus with other diabetic kidney complication: Secondary | ICD-10-CM | POA: Diagnosis not present

## 2016-07-08 DIAGNOSIS — N2581 Secondary hyperparathyroidism of renal origin: Secondary | ICD-10-CM | POA: Diagnosis not present

## 2016-07-11 ENCOUNTER — Encounter: Payer: Self-pay | Admitting: Emergency Medicine

## 2016-07-11 ENCOUNTER — Emergency Department: Payer: Medicare Other

## 2016-07-11 ENCOUNTER — Inpatient Hospital Stay
Admission: EM | Admit: 2016-07-11 | Discharge: 2016-07-12 | DRG: 640 | Disposition: A | Discharge status: Home / Self Care | Payer: Medicare Other | Attending: Internal Medicine | Admitting: Internal Medicine

## 2016-07-11 ENCOUNTER — Other Ambulatory Visit: Payer: Self-pay

## 2016-07-11 DIAGNOSIS — E875 Hyperkalemia: Secondary | ICD-10-CM | POA: Diagnosis present

## 2016-07-11 DIAGNOSIS — G40909 Epilepsy, unspecified, not intractable, without status epilepticus: Secondary | ICD-10-CM | POA: Diagnosis present

## 2016-07-11 DIAGNOSIS — F411 Generalized anxiety disorder: Secondary | ICD-10-CM | POA: Diagnosis present

## 2016-07-11 DIAGNOSIS — K219 Gastro-esophageal reflux disease without esophagitis: Secondary | ICD-10-CM | POA: Diagnosis present

## 2016-07-11 DIAGNOSIS — I120 Hypertensive chronic kidney disease with stage 5 chronic kidney disease or end stage renal disease: Secondary | ICD-10-CM | POA: Diagnosis present

## 2016-07-11 DIAGNOSIS — F329 Major depressive disorder, single episode, unspecified: Secondary | ICD-10-CM | POA: Diagnosis present

## 2016-07-11 DIAGNOSIS — I10 Essential (primary) hypertension: Secondary | ICD-10-CM | POA: Diagnosis not present

## 2016-07-11 DIAGNOSIS — D631 Anemia in chronic kidney disease: Secondary | ICD-10-CM | POA: Diagnosis present

## 2016-07-11 DIAGNOSIS — Z888 Allergy status to other drugs, medicaments and biological substances status: Secondary | ICD-10-CM

## 2016-07-11 DIAGNOSIS — Z79899 Other long term (current) drug therapy: Secondary | ICD-10-CM

## 2016-07-11 DIAGNOSIS — Z7901 Long term (current) use of anticoagulants: Secondary | ICD-10-CM

## 2016-07-11 DIAGNOSIS — G8929 Other chronic pain: Secondary | ICD-10-CM | POA: Diagnosis present

## 2016-07-11 DIAGNOSIS — N2581 Secondary hyperparathyroidism of renal origin: Secondary | ICD-10-CM | POA: Diagnosis present

## 2016-07-11 DIAGNOSIS — M549 Dorsalgia, unspecified: Secondary | ICD-10-CM | POA: Diagnosis present

## 2016-07-11 DIAGNOSIS — Z992 Dependence on renal dialysis: Secondary | ICD-10-CM

## 2016-07-11 DIAGNOSIS — G4733 Obstructive sleep apnea (adult) (pediatric): Secondary | ICD-10-CM | POA: Diagnosis present

## 2016-07-11 DIAGNOSIS — N186 End stage renal disease: Secondary | ICD-10-CM | POA: Diagnosis present

## 2016-07-11 DIAGNOSIS — E669 Obesity, unspecified: Secondary | ICD-10-CM | POA: Diagnosis not present

## 2016-07-11 LAB — RENAL FUNCTION PANEL
ALBUMIN: 3.5 g/dL (ref 3.5–5.0)
Anion gap: 18 — ABNORMAL HIGH (ref 5–15)
BUN: 72 mg/dL — AB (ref 6–20)
CO2: 21 mmol/L — ABNORMAL LOW (ref 22–32)
CREATININE: 15.62 mg/dL — AB (ref 0.61–1.24)
Calcium: 8.3 mg/dL — ABNORMAL LOW (ref 8.9–10.3)
Chloride: 99 mmol/L — ABNORMAL LOW (ref 101–111)
GFR calc Af Amer: 4 mL/min — ABNORMAL LOW (ref >60)
GFR, EST NON AFRICAN AMERICAN: 3 mL/min — AB (ref >60)
GLUCOSE: 127 mg/dL — AB (ref 65–99)
PHOSPHORUS: 11.9 mg/dL — AB (ref 2.5–4.6)
Potassium: 4.3 mmol/L (ref 3.5–5.1)
Sodium: 138 mmol/L (ref 135–145)

## 2016-07-11 LAB — CBC WITH DIFFERENTIAL/PLATELET
BASOS ABS: 0.1 10*3/uL (ref 0–0.1)
Basophils Relative: 1 %
Eosinophils Absolute: 0.2 10*3/uL (ref 0–0.7)
Eosinophils Relative: 3 %
HEMATOCRIT: 41.1 % (ref 40.0–52.0)
HEMOGLOBIN: 13 g/dL (ref 13.0–18.0)
Lymphocytes Relative: 8 %
Lymphs Abs: 0.6 10*3/uL — ABNORMAL LOW (ref 1.0–3.6)
MCH: 26.8 pg (ref 26.0–34.0)
MCHC: 31.6 g/dL — AB (ref 32.0–36.0)
MCV: 84.9 fL (ref 80.0–100.0)
MONOS PCT: 8 %
Monocytes Absolute: 0.6 10*3/uL (ref 0.2–1.0)
NEUTROS ABS: 6.2 10*3/uL (ref 1.4–6.5)
NEUTROS PCT: 80 %
Platelets: 318 10*3/uL (ref 150–440)
RBC: 4.85 MIL/uL (ref 4.40–5.90)
RDW: 18.4 % — ABNORMAL HIGH (ref 11.5–14.5)
WBC: 7.7 10*3/uL (ref 3.8–10.6)

## 2016-07-11 LAB — BASIC METABOLIC PANEL
ANION GAP: 15 (ref 5–15)
BUN: 70 mg/dL — ABNORMAL HIGH (ref 6–20)
CHLORIDE: 98 mmol/L — AB (ref 101–111)
CO2: 24 mmol/L (ref 22–32)
Calcium: 8.6 mg/dL — ABNORMAL LOW (ref 8.9–10.3)
Creatinine, Ser: 15.42 mg/dL — ABNORMAL HIGH (ref 0.61–1.24)
GFR calc non Af Amer: 3 mL/min — ABNORMAL LOW (ref >60)
GFR, EST AFRICAN AMERICAN: 4 mL/min — AB (ref >60)
Glucose, Bld: 77 mg/dL (ref 65–99)
POTASSIUM: 6.2 mmol/L — AB (ref 3.5–5.1)
Sodium: 137 mmol/L (ref 135–145)

## 2016-07-11 LAB — CBC
HCT: 38.6 % — ABNORMAL LOW (ref 40.0–52.0)
Hemoglobin: 12.6 g/dL — ABNORMAL LOW (ref 13.0–18.0)
MCH: 26.9 pg (ref 26.0–34.0)
MCHC: 32.6 g/dL (ref 32.0–36.0)
MCV: 82.6 fL (ref 80.0–100.0)
PLATELETS: 295 10*3/uL (ref 150–440)
RBC: 4.68 MIL/uL (ref 4.40–5.90)
RDW: 18.3 % — AB (ref 11.5–14.5)
WBC: 6.6 10*3/uL (ref 3.8–10.6)

## 2016-07-11 MED ORDER — POLYETHYLENE GLYCOL 3350 17 G PO PACK: 1.0000 | PACK | Freq: Every day | ORAL | Status: DC | PRN

## 2016-07-11 MED ORDER — GABAPENTIN 300 MG PO CAPS
300.0000 mg | ORAL_CAPSULE | Freq: Every day | ORAL | Status: DC
  Administered 2016-07-11: 300 mg via ORAL
  Filled 2016-07-11: qty 1

## 2016-07-11 MED ORDER — SEVELAMER CARBONATE 800 MG PO TABS
3200.0000 mg | ORAL_TABLET | Freq: Three times a day (TID) | ORAL | Status: DC
  Filled 2016-07-11: qty 4

## 2016-07-11 MED ORDER — LIDOCAINE HCL (PF) 1 % IJ SOLN
5.0000 mL | INTRAMUSCULAR | Status: DC | PRN
  Filled 2016-07-11: qty 5

## 2016-07-11 MED ORDER — PANTOPRAZOLE SODIUM 20 MG PO TBEC: 20.0000 mg | DELAYED_RELEASE_TABLET | Freq: Every day | ORAL | Status: DC

## 2016-07-11 MED ORDER — TRAMADOL HCL 50 MG PO TABS: 50.0000 mg | ORAL_TABLET | Freq: Four times a day (QID) | ORAL | Status: DC | PRN

## 2016-07-11 MED ORDER — ACETAMINOPHEN 325 MG PO TABS: 650.0000 mg | ORAL_TABLET | Freq: Four times a day (QID) | ORAL | Status: DC | PRN

## 2016-07-11 MED ORDER — ONDANSETRON HCL 4 MG PO TABS: 4.0000 mg | ORAL_TABLET | Freq: Four times a day (QID) | ORAL | Status: DC | PRN

## 2016-07-11 MED ORDER — ALPRAZOLAM 1 MG PO TABS
2.0000 mg | ORAL_TABLET | Freq: Every evening | ORAL | Status: DC | PRN
  Administered 2016-07-11: 2 mg via ORAL
  Filled 2016-07-11: qty 2

## 2016-07-11 MED ORDER — APIXABAN 2.5 MG PO TABS: 2.5000 mg | ORAL_TABLET | Freq: Two times a day (BID) | ORAL | Status: DC

## 2016-07-11 MED ORDER — SODIUM CHLORIDE 0.9 % IV SOLN: 100.0000 mL | INTRAVENOUS | Status: DC | PRN

## 2016-07-11 MED ORDER — SODIUM POLYSTYRENE SULFONATE 15 GM/60ML PO SUSP
30.0000 g | Freq: Once | ORAL | Status: AC
  Administered 2016-07-11: 30 g via ORAL
  Filled 2016-07-11: qty 120

## 2016-07-11 MED ORDER — HEPARIN SODIUM (PORCINE) 1000 UNIT/ML DIALYSIS
1000.0000 [IU] | INTRAMUSCULAR | Status: DC | PRN
  Filled 2016-07-11: qty 1

## 2016-07-11 MED ORDER — ONDANSETRON HCL 4 MG/2ML IJ SOLN: 4.0000 mg | Freq: Four times a day (QID) | INTRAMUSCULAR | Status: DC | PRN

## 2016-07-11 MED ORDER — ONDANSETRON HCL 4 MG/2ML IJ SOLN
4.0000 mg | Freq: Once | INTRAMUSCULAR | Status: DC
  Filled 2016-07-11: qty 2

## 2016-07-11 MED ORDER — PANTOPRAZOLE SODIUM 40 MG PO TBEC
40.0000 mg | DELAYED_RELEASE_TABLET | Freq: Every day | ORAL | Status: DC
  Filled 2016-07-11: qty 1

## 2016-07-11 MED ORDER — CALCIUM ACETATE (PHOS BINDER) 667 MG PO CAPS
2001.0000 mg | ORAL_CAPSULE | Freq: Three times a day (TID) | ORAL | Status: DC
  Filled 2016-07-11: qty 3

## 2016-07-11 MED ORDER — ESCITALOPRAM OXALATE 20 MG PO TABS
20.0000 mg | ORAL_TABLET | Freq: Every day | ORAL | Status: DC
  Filled 2016-07-11 (×2): qty 1

## 2016-07-11 MED ORDER — ACETAMINOPHEN 650 MG RE SUPP: 650.0000 mg | Freq: Four times a day (QID) | RECTAL | Status: DC | PRN

## 2016-07-11 MED ORDER — LIDOCAINE-PRILOCAINE 2.5-2.5 % EX CREA: 1.0000 "application " | TOPICAL_CREAM | CUTANEOUS | Status: DC | PRN

## 2016-07-11 MED ORDER — HEPARIN SODIUM (PORCINE) 5000 UNIT/ML IJ SOLN
5000.0000 [IU] | Freq: Three times a day (TID) | INTRAMUSCULAR | Status: DC
  Administered 2016-07-11 – 2016-07-12 (×2): 5000 [IU] via SUBCUTANEOUS
  Filled 2016-07-11 (×2): qty 1

## 2016-07-11 MED ORDER — ALTEPLASE 2 MG IJ SOLR
2.0000 mg | Freq: Once | INTRAMUSCULAR | Status: DC | PRN
  Filled 2016-07-11: qty 2

## 2016-07-11 MED ORDER — PENTAFLUOROPROP-TETRAFLUOROETH EX AERO: 1.0000 "application " | INHALATION_SPRAY | CUTANEOUS | Status: DC | PRN

## 2016-07-11 MED ORDER — CINACALCET HCL 30 MG PO TABS
120.0000 mg | ORAL_TABLET | Freq: Every evening | ORAL | Status: DC
  Administered 2016-07-11: 120 mg via ORAL
  Filled 2016-07-11 (×2): qty 4

## 2016-07-11 NOTE — ED Triage Notes (Signed)
[facility] here from [facility], today would be his dialysis day. Brought here for evaluation
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
## 2016-07-11 NOTE — ED Notes (Signed)
Pt reports having large bowel movement.

## 2016-07-11 NOTE — ED Notes (Signed)
Patient transported to X-ray 

## 2016-07-11 NOTE — Progress Notes (Signed)
Post hd assessment 
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
## 2016-07-11 NOTE — Progress Notes (Signed)
Post hd vitals 

## 2016-07-11 NOTE — Progress Notes (Signed)
Pre hd assessment  

## 2016-07-11 NOTE — ED Notes (Signed)
Pt reports he does dialysis MWF. Pt states last treatment 07/08/16. Pt in no apparent distress. Respirations even and non-labored. Officer at bedside.

## 2016-07-11 NOTE — Progress Notes (Signed)
Start of hd 

## 2016-07-11 NOTE — ED Notes (Signed)
Pt vomiting. Dr. Allena KatzPatel notified. Orders received.
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
## 2016-07-11 NOTE — Progress Notes (Signed)
Pre hd info 

## 2016-07-11 NOTE — Progress Notes (Signed)
  End of hd 

## 2016-07-12 DIAGNOSIS — E875 Hyperkalemia: Secondary | ICD-10-CM | POA: Diagnosis not present

## 2016-07-12 DIAGNOSIS — Z992 Dependence on renal dialysis: Secondary | ICD-10-CM | POA: Diagnosis not present

## 2016-07-12 DIAGNOSIS — I120 Hypertensive chronic kidney disease with stage 5 chronic kidney disease or end stage renal disease: Secondary | ICD-10-CM | POA: Diagnosis not present

## 2016-07-12 DIAGNOSIS — N2581 Secondary hyperparathyroidism of renal origin: Secondary | ICD-10-CM | POA: Diagnosis not present

## 2016-07-12 DIAGNOSIS — E669 Obesity, unspecified: Secondary | ICD-10-CM | POA: Diagnosis not present

## 2016-07-12 DIAGNOSIS — K219 Gastro-esophageal reflux disease without esophagitis: Secondary | ICD-10-CM | POA: Diagnosis not present

## 2016-07-12 DIAGNOSIS — I10 Essential (primary) hypertension: Secondary | ICD-10-CM | POA: Diagnosis not present

## 2016-07-12 DIAGNOSIS — D631 Anemia in chronic kidney disease: Secondary | ICD-10-CM | POA: Diagnosis not present

## 2016-07-12 DIAGNOSIS — N186 End stage renal disease: Secondary | ICD-10-CM | POA: Diagnosis not present

## 2016-07-12 LAB — BASIC METABOLIC PANEL
ANION GAP: 14 (ref 5–15)
BUN: 53 mg/dL — ABNORMAL HIGH (ref 6–20)
CO2: 26 mmol/L (ref 22–32)
Calcium: 7.5 mg/dL — ABNORMAL LOW (ref 8.9–10.3)
Chloride: 100 mmol/L — ABNORMAL LOW (ref 101–111)
Creatinine, Ser: 12.69 mg/dL — ABNORMAL HIGH (ref 0.61–1.24)
GFR calc Af Amer: 5 mL/min — ABNORMAL LOW (ref >60)
GFR, EST NON AFRICAN AMERICAN: 4 mL/min — AB (ref >60)
GLUCOSE: 100 mg/dL — AB (ref 65–99)
POTASSIUM: 4.4 mmol/L (ref 3.5–5.1)
SODIUM: 140 mmol/L (ref 135–145)

## 2016-07-12 LAB — MRSA PCR SCREENING: MRSA BY PCR: POSITIVE — AB

## 2016-07-12 MED ORDER — MUPIROCIN 2 % EX OINT
1.0000 "application " | TOPICAL_OINTMENT | Freq: Two times a day (BID) | CUTANEOUS | Status: DC
  Administered 2016-07-12: 1 via NASAL
  Filled 2016-07-12: qty 22

## 2016-07-12 MED ORDER — ALPRAZOLAM 2 MG PO TABS: ORAL_TABLET | ORAL | 0 refills | Status: DC

## 2016-07-12 MED ORDER — TRAMADOL HCL 50 MG PO TABS: 50.0000 mg | ORAL_TABLET | Freq: Two times a day (BID) | ORAL | 0 refills | Status: DC | PRN

## 2016-07-12 MED ORDER — CHLORHEXIDINE GLUCONATE CLOTH 2 % EX PADS
6.0000 | MEDICATED_PAD | Freq: Every day | CUTANEOUS | Status: DC
  Administered 2016-07-12: 6 via TOPICAL

## 2016-07-12 NOTE — Progress Notes (Signed)
[facility]  ROUNDING NOTE   Subjective:  Patient seen at bedside. He underwent dialysis successfully yesterday. Ultrafiltration achieved was 1.6 kg. He will likely need hemodialysis again tomorrow. The current plan is that he will likely be transitioned [facility] [place] where dialysis services can be provided.   Objective:  Vital signs in last 24 hours:  Temp:  [97.7 F (36.5 C)-98.6 F (37 C)] 98.6 F (37 C) (06/26 0448) Pulse Rate:  [79-97] 97 (06/26 0448) Resp:  [14-17] 14 (06/26 0448) BP: (100-145)/(52-82) 100/52 (06/26 0448) SpO2:  [97 %-100 %] 100 % (06/26 0448) Weight:  [103 kg (227 lb 1.2 oz)] 103 kg (227 lb 1.2 oz) (06/25 1810)  Weight change:  Filed Weights   07/11/16 1320 07/11/16 1810  Weight: 103 kg (227 lb 1.2 oz) 103 kg (227 lb 1.2 oz)    Intake/Output: I/O last 3 completed shifts: In: -  Out: 1600 [Other:1600]   Intake/Output this shift:  No intake/output data recorded.  Physical Exam: General: No acute distress  Head: Normocephalic, atraumatic. Moist oral mucosal membranes  Eyes: Anicteric  Neck: Supple, trachea midline  Lungs:  Clear to auscultation, normal effort  Heart: S1S2 no rubs  Abdomen:  Soft, nontender, bowel sounds present  Extremities: Trace peripheral edema.  Neurologic: Awake, alert, following commands  Skin: No lesions  Access: L IJ permcath    Basic Metabolic Panel:  Recent Labs Lab 07/11/16 1322 07/11/16 1912 07/12/16 1002  NA 137 138 140  K 6.2* 4.3 4.4  CL 98* 99* 100*  CO2 24 21* 26  GLUCOSE 77 127* 100*  BUN 70* 72* 53*  CREATININE 15.42* 15.62* 12.69*  CALCIUM 8.6* 8.3* 7.5*  PHOS  --  11.9*  --     Liver Function Tests:  Recent Labs Lab 07/11/16 1912  ALBUMIN 3.5   No results for input(s): LIPASE, AMYLASE in the last 168 hours. No results for input(s): AMMONIA in the last 168 hours.  CBC:  Recent Labs Lab 07/11/16 1322 07/11/16 2228  WBC 7.7 6.6  NEUTROABS 6.2  --    HGB 13.0 12.6*  HCT 41.1 38.6*  MCV 84.9 82.6  PLT 318 295    Cardiac Enzymes: No results for input(s): CKTOTAL, CKMB, CKMBINDEX, TROPONINI in the last 168 hours.  BNP: Invalid input(s): POCBNP  CBG: No results for input(s): GLUCAP in the last 168 hours.  Microbiology: Results for orders placed or performed during the hospital encounter of 07/11/16  MRSA PCR Screening     Status: Abnormal   Collection Time: 07/11/16 10:19 PM  Result Value Ref Range Status   MRSA by PCR POSITIVE (A) NEGATIVE Final    Comment:        The GeneXpert MRSA Assay (FDA approved for NASAL specimens only), is one component of a comprehensive MRSA colonization surveillance program. It is not intended to diagnose MRSA infection nor to guide or monitor treatment for MRSA infections. RESULT CALLED TO, READ BACK BY AND VERIFIED WITH: OLIVIA ROGERS @ 0026 ON 07/12/2016 BY CAF     Coagulation Studies: No results for input(s): LABPROT, INR in the last 72 hours.  Urinalysis: No results for input(s): COLORURINE, LABSPEC, PHURINE, GLUCOSEU, HGBUR, BILIRUBINUR, KETONESUR, PROTEINUR, UROBILINOGEN, NITRITE, LEUKOCYTESUR in the last 72 hours.  Invalid input(s): APPERANCEUR    Imaging: Dg Chest 2 View  Result Date: 07/11/2016 CLINICAL DATA:  Dialysis patient. EXAM: CHEST  2 VIEW COMPARISON:  05/06/2015 FINDINGS: Left dialysis catheter is unchanged with the tip in  the right atrium. 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 No confluent airspace opacities or effusions. No acute bony abnormality. IMPRESSION: No active cardiopulmonary disease. Electronically Signed   By: Charlett Nose M.D.   On: 07/11/2016 14:20     Medications:   . sodium chloride    . sodium chloride     . calcium acetate  2,001 mg Oral TID WC  . Chlorhexidine Gluconate Cloth  6 each Topical Q0600  . cinacalcet  120 mg Oral QPM  . escitalopram  20 mg Oral QHS   . gabapentin  300 mg Oral QHS  . heparin  5,000 Units Subcutaneous Q8H  . mupirocin ointment  1 application Nasal BID  . ondansetron (ZOFRAN) IV  4 mg Intravenous Once  . pantoprazole  40 mg Oral Daily  . sevelamer carbonate  3,200 mg Oral TID WC   sodium chloride, sodium chloride, acetaminophen **OR** acetaminophen, alprazolam, alteplase, heparin, lidocaine (PF), lidocaine-prilocaine, ondansetron **OR** ondansetron (ZOFRAN) IV, pentafluoroprop-tetrafluoroeth, polyethylene glycol, traMADol  Assessment/ Plan:  46 y.o. male with a PMHx of End-stage renal disease on hemodialysis MWF followed by [facility], chronic back pain, GERD, hypertension, seizure disorder, obstructive sleep apnea, anemia chronic kidney disease, secondary hyperparathyroidism, anxiety, who was admitted to [facility] on 07/11/2016 for evaluation for dialysis.   1. ESRD on HD MWF: Patient followed by [facility] as an outpatient. He last had dialysis on Friday and was subsequently incarcerated.  -  Significant coordination of care undertaken today. Patient cannot go back to his prior outpatient dialysis center in [facility].  In addition he will be unable to go to the dialysis center on [address]. The current plan is that [facility]. This plan was confirmed with care management and the [facility] sheriff's department.    2. Hyperkalemia. Potassium down to 4.4 post dialysis. Continue to monitor serum potassium as an outpatient.  3. Anemia of chronic kidney disease.   hemoglobin currently 12.6. Hold off on Epogen.  4. Secondary hyperparathyroidism. Phosphorus was quite high yesterday at 11.9. Continue current doses of Renvela and calcium acetate. Patient also on Sensipar. Continue to monitor bone marrow metabolism parameters as an outpatient.   LOS: 1 March Joos 6/26/201811:14 AM

## 2016-07-12 NOTE — Discharge Instructions (Signed)
Needs to be at a facility that can do dialysis Monday, Wednesday and Friday  Hyperkalemia Hyperkalemia is when you have too much potassium in your blood. Potassium is normally removed (excreted) from your body by your kidneys. If there is too much potassium in your blood, it can affect how your heart works. Follow these instructions at home:  Take medicines only as told by your doctor.  Do not take any supplements, natural products, herbs, or vitamins unless your doctor says it is okay.  Limit your alcohol intake as told by your doctor.  Stop illegal drug use. If you need help quitting, ask your doctor.  Keep all follow-up visits as told by your doctor. This is important.  If you have kidney disease, you may need to follow a low potassium diet. A food specialist (dietitian) can help you. Contact a doctor if:  Your heartbeat is not regular or very slow.  You feel dizzy (light-headed).  You feel weak.  You feel sick to your stomach (nauseous).  You have tingling in your hands or feet.  You cannot feel your hands or feet. Get help right away if:  You are short of breath.  You have chest pain.  You pass out (faint).  You cannot move your muscles. This information is not intended to replace advice given to you by your health care provider. Make sure you discuss any questions you have with your health care provider. Document Released: 01/03/2005 Document Revised: 06/11/2015 Document Reviewed: 04/10/2013 Elsevier Interactive Patient Education  2018 [employer]

## 2016-07-12 NOTE — Care Management (Signed)
[facility] Patient was taken to his outpatient HD center, and his HD could not be completed due to the center not having a contract with the jail.  RMCM spoke with Judd GaudierLieutenant Brown at [phone].  Per Judd GaudierLieutenant Brown after patient is discharged "Safe Keeping" process will be initiated, and [facility] will be [facility].  [facility] and HD will be competed there. Per Judd GaudierLieutenant Brown process only takes a few hours and transfer would happen today.  RNCM asked to notify officer in the room.  RNCM also spoke with Mayra ReelLieutenant Crowder and updated her on the plan.  Dimas ChyleAmanda Morris HD liaison to be notified. RNCM signing off.
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
## 2016-07-12 NOTE — Progress Notes (Signed)
Called Dr. Tobi BastosPyreddy in regards to patients code status. Pt reported that he is a DNR. Pts wishes reported to Dr. Tobi BastosPyreddy. He ordered for code status to be reviewed with rounding AM physician.

## 2016-07-12 NOTE — Progress Notes (Signed)
Patient discharge teaching given, including activity, diet, follow-up appoints, and medications. Patient verbalized understanding of all discharge instructions. IV access was d/c'd. Vitals are stable. Skin is intact except as charted in most recent assessments. Pt to be escorted out by officer and driven back to jail.   Perry [name] [place]

## 2016-07-13 LAB — HEPATITIS B SURFACE ANTIGEN: Hepatitis B Surface Ag: NEGATIVE

## 2016-07-13 LAB — HEPATITIS B SURFACE ANTIBODY,QUALITATIVE: Hep B S Ab: NONREACTIVE

## 2016-07-16 DIAGNOSIS — I120 Hypertensive chronic kidney disease with stage 5 chronic kidney disease or end stage renal disease: Secondary | ICD-10-CM | POA: Diagnosis not present

## 2016-07-16 DIAGNOSIS — N186 End stage renal disease: Secondary | ICD-10-CM | POA: Diagnosis not present

## 2016-07-16 DIAGNOSIS — Z992 Dependence on renal dialysis: Secondary | ICD-10-CM | POA: Diagnosis not present

## 2016-12-07 ENCOUNTER — Encounter (HOSPITAL_COMMUNITY): Payer: Self-pay

## 2017-07-25 ENCOUNTER — Inpatient Hospital Stay
Admission: EM | Admit: 2017-07-25 | Discharge: 2017-08-02 | DRG: 291 | Disposition: A | Discharge status: Home / Self Care | Payer: Medicare Other | Attending: Internal Medicine | Admitting: Internal Medicine

## 2017-07-25 ENCOUNTER — Other Ambulatory Visit: Payer: Self-pay

## 2017-07-25 DIAGNOSIS — D631 Anemia in chronic kidney disease: Secondary | ICD-10-CM | POA: Diagnosis present

## 2017-07-25 DIAGNOSIS — I8291 Chronic embolism and thrombosis of unspecified vein: Secondary | ICD-10-CM | POA: Diagnosis present

## 2017-07-25 DIAGNOSIS — I951 Orthostatic hypotension: Secondary | ICD-10-CM | POA: Diagnosis present

## 2017-07-25 DIAGNOSIS — R5381 Other malaise: Secondary | ICD-10-CM | POA: Diagnosis not present

## 2017-07-25 DIAGNOSIS — I5032 Chronic diastolic (congestive) heart failure: Secondary | ICD-10-CM | POA: Diagnosis present

## 2017-07-25 DIAGNOSIS — Z022 Encounter for examination for admission to residential institution: Secondary | ICD-10-CM

## 2017-07-25 DIAGNOSIS — Z888 Allergy status to other drugs, medicaments and biological substances status: Secondary | ICD-10-CM

## 2017-07-25 DIAGNOSIS — I080 Rheumatic disorders of both mitral and aortic valves: Secondary | ICD-10-CM | POA: Diagnosis present

## 2017-07-25 DIAGNOSIS — K219 Gastro-esophageal reflux disease without esophagitis: Secondary | ICD-10-CM | POA: Diagnosis present

## 2017-07-25 DIAGNOSIS — F419 Anxiety disorder, unspecified: Secondary | ICD-10-CM | POA: Diagnosis present

## 2017-07-25 DIAGNOSIS — I132 Hypertensive heart and chronic kidney disease with heart failure and with stage 5 chronic kidney disease, or end stage renal disease: Secondary | ICD-10-CM | POA: Diagnosis not present

## 2017-07-25 DIAGNOSIS — I69354 Hemiplegia and hemiparesis following cerebral infarction affecting left non-dominant side: Secondary | ICD-10-CM

## 2017-07-25 DIAGNOSIS — I69391 Dysphagia following cerebral infarction: Secondary | ICD-10-CM

## 2017-07-25 DIAGNOSIS — R1310 Dysphagia, unspecified: Secondary | ICD-10-CM | POA: Diagnosis present

## 2017-07-25 DIAGNOSIS — Z992 Dependence on renal dialysis: Secondary | ICD-10-CM

## 2017-07-25 DIAGNOSIS — G92 Toxic encephalopathy: Secondary | ICD-10-CM | POA: Diagnosis not present

## 2017-07-25 DIAGNOSIS — J690 Pneumonitis due to inhalation of food and vomit: Secondary | ICD-10-CM | POA: Diagnosis not present

## 2017-07-25 DIAGNOSIS — N186 End stage renal disease: Secondary | ICD-10-CM | POA: Diagnosis present

## 2017-07-25 DIAGNOSIS — R0902 Hypoxemia: Secondary | ICD-10-CM | POA: Diagnosis not present

## 2017-07-25 DIAGNOSIS — Z7901 Long term (current) use of anticoagulants: Secondary | ICD-10-CM

## 2017-07-25 DIAGNOSIS — G4733 Obstructive sleep apnea (adult) (pediatric): Secondary | ICD-10-CM | POA: Diagnosis present

## 2017-07-25 DIAGNOSIS — Z7401 Bed confinement status: Secondary | ICD-10-CM

## 2017-07-25 DIAGNOSIS — F17210 Nicotine dependence, cigarettes, uncomplicated: Secondary | ICD-10-CM | POA: Diagnosis present

## 2017-07-25 DIAGNOSIS — M7989 Other specified soft tissue disorders: Secondary | ICD-10-CM

## 2017-07-25 DIAGNOSIS — Z6835 Body mass index (BMI) 35.0-35.9, adult: Secondary | ICD-10-CM

## 2017-07-25 DIAGNOSIS — T4395XA Adverse effect of unspecified psychotropic drug, initial encounter: Secondary | ICD-10-CM | POA: Diagnosis not present

## 2017-07-25 DIAGNOSIS — F411 Generalized anxiety disorder: Secondary | ICD-10-CM

## 2017-07-25 DIAGNOSIS — N2581 Secondary hyperparathyroidism of renal origin: Secondary | ICD-10-CM | POA: Diagnosis present

## 2017-07-25 LAB — COMPREHENSIVE METABOLIC PANEL
ALBUMIN: 3.1 g/dL — AB (ref 3.5–5.0)
ALK PHOS: 83 U/L (ref 38–126)
ALT: 15 U/L (ref 0–44)
ANION GAP: 11 (ref 5–15)
AST: 14 U/L — ABNORMAL LOW (ref 15–41)
BUN: 20 mg/dL (ref 6–20)
CALCIUM: 10 mg/dL (ref 8.9–10.3)
CO2: 31 mmol/L (ref 22–32)
Chloride: 101 mmol/L (ref 98–111)
Creatinine, Ser: 6.06 mg/dL — ABNORMAL HIGH (ref 0.61–1.24)
GFR calc non Af Amer: 10 mL/min — ABNORMAL LOW (ref >60)
GFR, EST AFRICAN AMERICAN: 12 mL/min — AB (ref >60)
Glucose, Bld: 94 mg/dL (ref 70–99)
POTASSIUM: 3.2 mmol/L — AB (ref 3.5–5.1)
SODIUM: 143 mmol/L (ref 135–145)
Total Bilirubin: 0.8 mg/dL (ref 0.3–1.2)
Total Protein: 6.2 g/dL — ABNORMAL LOW (ref 6.5–8.1)

## 2017-07-25 LAB — CBC
HCT: 38.8 % — ABNORMAL LOW (ref 40.0–52.0)
Hemoglobin: 12.5 g/dL — ABNORMAL LOW (ref 13.0–18.0)
MCH: 27.9 pg (ref 26.0–34.0)
MCHC: 32.1 g/dL (ref 32.0–36.0)
MCV: 86.9 fL (ref 80.0–100.0)
PLATELETS: 225 10*3/uL (ref 150–440)
RBC: 4.46 MIL/uL (ref 4.40–5.90)
RDW: 19.5 % — ABNORMAL HIGH (ref 11.5–14.5)
WBC: 4.2 10*3/uL (ref 3.8–10.6)
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
## 2017-07-25 NOTE — ED Notes (Signed)
ED MD completed butterfly straight stick with ultrasound to obtain blood test.
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
## 2017-07-25 NOTE — ED Notes (Signed)
Kip Cropp RN attempted 1 iv stick, megan RN attempted IV stick 3 times unsuccessfully.

## 2017-07-26 LAB — BASIC METABOLIC PANEL
Anion gap: 11 (ref 5–15)
BUN: 25 mg/dL — ABNORMAL HIGH (ref 6–20)
CHLORIDE: 101 mmol/L (ref 98–111)
CO2: 30 mmol/L (ref 22–32)
Calcium: 9.8 mg/dL (ref 8.9–10.3)
Creatinine, Ser: 7.49 mg/dL — ABNORMAL HIGH (ref 0.61–1.24)
GFR calc non Af Amer: 8 mL/min — ABNORMAL LOW (ref >60)
GFR, EST AFRICAN AMERICAN: 9 mL/min — AB (ref >60)
Glucose, Bld: 123 mg/dL — ABNORMAL HIGH (ref 70–99)
POTASSIUM: 3.4 mmol/L — AB (ref 3.5–5.1)
SODIUM: 142 mmol/L (ref 135–145)

## 2017-07-26 LAB — CBC WITH DIFFERENTIAL/PLATELET
Basophils Absolute: 0 10*3/uL (ref 0–0.1)
Basophils Relative: 1 %
Eosinophils Absolute: 0 10*3/uL (ref 0–0.7)
Eosinophils Relative: 1 %
HEMATOCRIT: 39.3 % — AB (ref 40.0–52.0)
HEMOGLOBIN: 12.2 g/dL — AB (ref 13.0–18.0)
LYMPHS ABS: 0.4 10*3/uL — AB (ref 1.0–3.6)
LYMPHS PCT: 10 %
MCH: 27.3 pg (ref 26.0–34.0)
MCHC: 31 g/dL — AB (ref 32.0–36.0)
MCV: 88 fL (ref 80.0–100.0)
Monocytes Absolute: 0.3 10*3/uL (ref 0.2–1.0)
Monocytes Relative: 8 %
NEUTROS ABS: 3.6 10*3/uL (ref 1.4–6.5)
NEUTROS PCT: 80 %
Platelets: 231 10*3/uL (ref 150–440)
RBC: 4.46 MIL/uL (ref 4.40–5.90)
RDW: 19.4 % — ABNORMAL HIGH (ref 11.5–14.5)
WBC: 4.4 10*3/uL (ref 3.8–10.6)

## 2017-07-26 LAB — PROTIME-INR
INR: 3.02
PROTHROMBIN TIME: 31.1 s — AB (ref 11.4–15.2)

## 2017-07-26 MED ORDER — LORATADINE 10 MG PO TABS
10.0000 mg | ORAL_TABLET | Freq: Every day | ORAL | Status: DC
  Administered 2017-07-26 – 2017-08-02 (×8): 10 mg via ORAL
  Filled 2017-07-26 (×8): qty 1

## 2017-07-26 MED ORDER — MIDODRINE HCL 5 MG PO TABS
15.0000 mg | ORAL_TABLET | Freq: Three times a day (TID) | ORAL | Status: DC
  Administered 2017-07-26 – 2017-07-31 (×12): 15 mg via ORAL
  Filled 2017-07-26 (×15): qty 3

## 2017-07-26 MED ORDER — CINACALCET HCL 30 MG PO TABS
60.0000 mg | ORAL_TABLET | Freq: Every day | ORAL | Status: DC
  Administered 2017-07-26 – 2017-08-01 (×7): 60 mg via ORAL
  Filled 2017-07-26 (×10): qty 2

## 2017-07-26 MED ORDER — WARFARIN SODIUM 4 MG PO TABS
4.0000 mg | ORAL_TABLET | Freq: Every day | ORAL | Status: DC
  Administered 2017-07-26 – 2017-07-27 (×2): 4 mg via ORAL
  Filled 2017-07-26 (×3): qty 1

## 2017-07-26 MED ORDER — GABAPENTIN 300 MG PO CAPS
300.0000 mg | ORAL_CAPSULE | Freq: Every day | ORAL | Status: DC
  Administered 2017-07-26 – 2017-08-01 (×7): 300 mg via ORAL
  Filled 2017-07-26 (×8): qty 1

## 2017-07-26 MED ORDER — WARFARIN - PHYSICIAN DOSING INPATIENT
Freq: Every day | Status: DC
  Administered 2017-07-28: 18:00:00
  Filled 2017-07-26 (×7): qty 1

## 2017-07-26 MED ORDER — ACETAMINOPHEN 325 MG PO TABS: 650.0000 mg | ORAL_TABLET | Freq: Four times a day (QID) | ORAL | Status: DC | PRN

## 2017-07-26 MED ORDER — SEVELAMER CARBONATE 800 MG PO TABS
3200.0000 mg | ORAL_TABLET | Freq: Three times a day (TID) | ORAL | Status: DC
  Administered 2017-07-27 – 2017-08-02 (×12): 3200 mg via ORAL
  Filled 2017-07-26 (×15): qty 4

## 2017-07-26 MED ORDER — PANTOPRAZOLE SODIUM 40 MG PO TBEC
40.0000 mg | DELAYED_RELEASE_TABLET | Freq: Every day | ORAL | Status: DC
  Administered 2017-07-26 – 2017-08-02 (×8): 40 mg via ORAL
  Filled 2017-07-26 (×8): qty 1

## 2017-07-26 MED ORDER — TRAMADOL HCL 50 MG PO TABS
50.0000 mg | ORAL_TABLET | Freq: Two times a day (BID) | ORAL | Status: DC | PRN
  Administered 2017-08-01 – 2017-08-02 (×2): 50 mg via ORAL
  Filled 2017-07-26 (×2): qty 1

## 2017-07-26 MED ORDER — DIPHENHYDRAMINE HCL 25 MG PO CAPS: 25.0000 mg | ORAL_CAPSULE | Freq: Two times a day (BID) | ORAL | Status: DC | PRN

## 2017-07-26 MED ORDER — SODIUM CHLORIDE 0.9 % IV BOLUS
250.0000 mL | Freq: Once | INTRAVENOUS | Status: AC
  Administered 2017-07-27: 250 mL via INTRAVENOUS

## 2017-07-26 MED ORDER — ESCITALOPRAM OXALATE 10 MG PO TABS
20.0000 mg | ORAL_TABLET | Freq: Every day | ORAL | Status: DC
  Administered 2017-07-26 – 2017-08-01 (×7): 20 mg via ORAL
  Filled 2017-07-26 (×9): qty 2

## 2017-07-26 MED ORDER — LIDOCAINE-PRILOCAINE 2.5-2.5 % EX CREA
1.0000 "application " | TOPICAL_CREAM | CUTANEOUS | Status: DC | PRN
  Filled 2017-07-26: qty 5

## 2017-07-26 MED ORDER — POLYETHYLENE GLYCOL 3350 17 G PO PACK: 1.0000 | PACK | Freq: Every day | ORAL | Status: DC | PRN

## 2017-07-26 MED ORDER — ALPRAZOLAM 1 MG PO TABS
2.0000 mg | ORAL_TABLET | Freq: Every evening | ORAL | Status: DC | PRN
  Administered 2017-07-26: 1 mg via ORAL
  Administered 2017-07-31 – 2017-08-01 (×2): 2 mg via ORAL
  Filled 2017-07-26: qty 4
  Filled 2017-07-26 (×2): qty 2

## 2017-07-26 MED ORDER — CALCIUM ACETATE (PHOS BINDER) 667 MG PO CAPS
2001.0000 mg | ORAL_CAPSULE | Freq: Three times a day (TID) | ORAL | Status: DC
  Administered 2017-07-27 – 2017-08-02 (×12): 2001 mg via ORAL
  Filled 2017-07-26 (×15): qty 3

## 2017-07-26 MED ORDER — ADULT MULTIVITAMIN W/MINERALS CH
1.0000 | ORAL_TABLET | Freq: Every day | ORAL | Status: DC
  Administered 2017-07-26 – 2017-08-02 (×6): 1 via ORAL
  Filled 2017-07-26 (×6): qty 1

## 2017-07-26 NOTE — ED Notes (Signed)
Pt repositioned. Resting quietly with lights out. Provided for comfort and safety and will continue to assess.

## 2017-07-26 NOTE — Care Management (Signed)
Patient suffers from chronic back pain, CVA 03/2017, dialysis patient, fx of transverse process of T1-9, seizures, CHF, morbid obesity. A hospital bed will alleviate pain by allowing upper and lower body to be positioned in ways not feasible with a normal bed. Pain episodes frequently require immediate changes in body position which cannot be achieved with normal bed.

## 2017-07-26 NOTE — Care Management (Signed)
Wheel[facility].  Patient suffers from chronic back pain, CVA 03/2017, dialysis patient, fx of transverse process of T1-9, seizures, CHF, morbid obesity which impairs their ability to perform daily activities like toileting, feeding, dressing, grooming, and bathing in the home.  A cane, walker, or crutch will not resolve issue with performing activities of daily living.  A wheelchair will allow patient to safely perform daily activities.  Patient can safely propel the wheelchair in the home or has a caregiver who can provide assistance.

## 2017-07-26 NOTE — Evaluation (Signed)
Physical Therapy Evaluation Patient Details Name: Perry [name] MRN: [id number] DOB: 01-Jun-1970 Today's Date: 07/26/2017   History of Present Illness  Pt is a 47 y/o M who presents from prison who was brought as they were unable to provide required care for the pt.  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      Clinical Impression  Pt currently with functional limitations due to the deficits listed below (see PT Problem List). Mr. Lyerly presents with very minimal LUE strength and significant LLE weakness. Pt reports that at prison the staff was assisting with bathing, dressing, and using the lift for transfer.  The pt reports he was working with PT and was able to stand at bedside. Pt reports he has seen gains in LLE since his CVA in March. However, the pt provides conflicting information at times and his mother says the pt has been in bed for the past 2 months. Unable to attempt bed mobility or OOB activity due to BP of 80/59 in supine.  Pt requires mod assist to scoot in bed with heavy use of bed rail.  Given pt's current mobility status, recommending SNF at d/c. Pt will need WC and hoyer lift as well as a hospital bed.  Pt will benefit from skilled PT to increase their independence and safety with mobility to allow discharge to the venue listed below.    Patient with the below presentation which impairs his ability to perform daily activities like toileting,, dressing, grooming, bathing in the home. A walker will not resolve the patient's issue with performing activities of daily living. A wheelchair is required/recommended and will allow patient to safely perform daily activities with assistance.   Patient can safely propel the wheelchair in the home or has a caregiver who can provide assistance (mother hopeful that family/friends could provide assist while she is at work).      Follow Up  Recommendations SNF;Supervision/Assistance - 24 hour    Equipment Recommendations  Wheelchair (measurements PT);Wheelchair cushion (measurements PT);Hospital bed(Hoyer lift)    Recommendations for Other Services       Precautions / Restrictions Precautions Precautions: Fall;Other (comment) Precaution Comments: Monitor BP, SpO2, HR Restrictions Weight Bearing Restrictions: No      Mobility  Bed Mobility Overal bed mobility: Needs Assistance Bed Mobility: (scooting sideways in bed)           General bed mobility comments: Pt requires mod assist to scoot to the R in bed with use of RUE pulling on bed rail. Pt found positioned with L lateral trunk flexion with L arm pushed against railing of bed.   Transfers                 General transfer comment: Unable to attempt due to BP and weakness  Ambulation/Gait                Stairs            Wheelchair Mobility    Modified Rankin (Stroke Patients Only)       Balance                                             Pertinent Vitals/Pain Pain Assessment: Faces Faces Pain Scale: Hurts even more Pain Location: L shoulder with L shoulder F PROM  Pain Descriptors / Indicators: Grimacing;Moaning Pain Intervention(s): Limited activity within patient's tolerance;Monitored during session;Repositioned    Home Living Family/patient expects to be discharged to:: Skilled nursing facility Living Arrangements: (from prison) Available Help at Discharge: Family;Available PRN/intermittently Type of Home: House Home Access: Stairs to enter Entrance Stairs-Rails: Doctor, general practice of Steps: 5 Home Layout: One level Home Equipment: Walker - 2 wheels Additional Comments: From prison.  Pt wanting to go to SNF or ALF at d/c.  Other option is to go home with his mother (home layout above) who works during the day. Mother reports that she may be able to find family supervision/assist  during the day for the pt.     Prior Function Level of Independence: Needs assistance   Gait / Transfers Assistance Needed: Pt reports he was working with PT in prison and was able to stand at the side of the bed with PT but unable to ambulate.  Mother does not believe this to be true, says the pt has been in the bed since stroke.  Pt reports the prison staff was using a lift to assist the pt to the shower, but then later states that the prison staff rolled the bed into the shower.   ADL's / Homemaking Assistance Needed: Pt requires assist with bathing, dressing.         Hand Dominance        Extremity/Trunk Assessment   Upper Extremity Assessment Upper Extremity Assessment: LUE deficits/detail;RUE deficits/detail RUE Deficits / Details: RUE strength grossly 3/5 LUE Deficits / Details: L elbow E strength 2/5, otherwise not movement appreciated with MMT. Significant swelling throughout LUE and hand, positioned pt's LUE propped on pillow at end of evaluation.  LUE Sensation: decreased light touch(throughout entire LUE)    Lower Extremity Assessment Lower Extremity Assessment: RLE deficits/detail;LLE deficits/detail RLE Deficits / Details: Strength grossly 3+/5 LLE Deficits / Details: Hip F strength 2-/5, knee E 2/5, knee F 1/5.  Otherwise no motion appreciated with remaining MMT of LLE.  LLE Sensation: decreased light touch(throughout entire LLE. )       Communication   Communication: No difficulties  Cognition Arousal/Alertness: Awake/alert Behavior During Therapy: WFL for tasks assessed/performed Overall Cognitive Status: Impaired/Different from baseline Area of Impairment: Awareness                           Awareness: Emergent   General Comments: Unsure of pt's baseline level of cognition since stroke but pt at times talking about irrelevant topics.       General Comments General comments (skin integrity, edema, etc.): BP supine 80/59, thus deferred  mobility.  SpO2 low 90s (on 3L O2), HR 103 in supine.      Exercises     Assessment/Plan    PT Assessment Patient needs continued PT services  PT Problem List Decreased strength;Decreased range of motion;Decreased activity tolerance;Decreased balance;Decreased mobility;Decreased cognition;Decreased knowledge of use of DME;Decreased safety awareness;Pain;Impaired sensation;Cardiopulmonary status limiting activity       PT Treatment Interventions DME instruction;Gait training;Functional mobility training;Therapeutic activities;Therapeutic exercise;Balance training;Stair training;Neuromuscular re-education;Cognitive remediation;Patient/family education;Wheelchair mobility training;Modalities;Manual techniques    PT Goals (Current goals can be found in the Care Plan section)  Acute Rehab PT Goals Patient Stated Goal: to go to rehab at d/c PT Goal Formulation: With patient Time For Goal Achievement: 08/09/17 Potential to Achieve Goals: Fair    Frequency Min 2X/week   Barriers to discharge Inaccessible home environment;Decreased caregiver support Unsure of amount of  assist available from family/friends.  Steps to enter the home.     Co-evaluation               AM-PAC PT "6 Clicks" Daily Activity  Outcome Measure Difficulty turning over in bed (including adjusting bedclothes, sheets and blankets)?: Unable Difficulty moving from lying on back to sitting on the side of the bed? : Unable Difficulty sitting down on and standing up from a chair with arms (e.g., wheelchair, bedside commode, etc,.)?: Unable Help needed moving to and from a bed to chair (including a wheelchair)?: Total Help needed walking in hospital room?: Total Help needed climbing 3-5 steps with a railing? : Total 6 Click Score: 6    End of Session Equipment Utilized During Treatment: Oxygen Activity Tolerance: Treatment limited secondary to medical complications (Comment)(hypotension) Patient left: in bed;with  call bell/phone within reach;with family/visitor present Nurse Communication: Mobility status;Other (comment)(BP, Pt's PLOF) PT Visit Diagnosis: Muscle weakness (generalized) (M62.81);Hemiplegia and hemiparesis Hemiplegia - Right/Left: Left Hemiplegia - dominant/non-dominant: Non-dominant Hemiplegia - caused by: Cerebral infarction    Time: [id number] PT Time Calculation (min) (ACUTE ONLY): 27 min   Charges:   PT Evaluation $PT Eval High Complexity: 1 High     PT G Codes:        Encarnacion ChuAshley Abashian PT, DPT 07/26/2017, 12:30 PM

## 2017-07-26 NOTE — ED Notes (Addendum)
PT and mother at bedside.

## 2017-07-26 NOTE — ED Notes (Signed)
Pt calling out. Informs this nurse that his mother was inside the doorway of his linen closet. Pt reassured that his mother had gone home. Pt states he would like to call her to make sure because he heard her calling his name after the VS machine started beeping. Provided for comfort and safety. Reassured again. Told pt if he needed we could call his mother a bit later to make sure she had made it home safety. Verbalized understanding.
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
## 2017-07-26 NOTE — ED Notes (Signed)
Pts mother requesting for pt to go to dialysis today. MD aware and states he will consult with social work.

## 2017-07-26 NOTE — ED Notes (Signed)
Pt had oxygen removed. 02 placed back on pt at 3L.

## 2017-07-26 NOTE — ED Notes (Signed)
Pt provided with diet cola. Pt has removed himself from the monitor again. Pt placed back on the monitor. Asked pt to not remove himself from the monitor. Call bell within reach. Will continue to monitor.

## 2017-07-26 NOTE — ED Provider Notes (Addendum)
Discussed patient with Dr. Lourdes SledgeLatif renal doctor.  Patient is not in need of emergent dialysis tonight.  His electrolytes are okay.  He would like to try and wait till tomorrow if at all possible.  It seems very possible at present.  And that will be the plan on going unless he gets placed in a nursing home before that which case he may have to come here for dialysis before he gets regular dialysis set up.  Patient's family reports his blood pressure is usually running in the 90s sometimes little lower sometimes little higher.   Arnaldo NatalMalinda, Paul F, MD 07/26/17 1644    Arnaldo NatalMalinda, Paul F, MD 07/26/17 [phone]
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
## 2017-07-26 NOTE — ED Notes (Signed)
Pillow placed for comfort. Call bell within reach. Pt had removed himself from pulse oximetry and BP. Asked pt to please leave these on so he can be monitored. Pt verbalizes understanding. Denies other needs at this time. Will continue to monitor.

## 2017-07-26 NOTE — ED Notes (Signed)
Dr. Sunday SpillersPadchowski aware of PCP refusing pt for home care. Will continue to monitor patient.

## 2017-07-26 NOTE — Care Management (Addendum)
RN case manager received call from dialysis liaison confirming that patient does not have an outpatient hemodialysis chair established  .  RN updated  .

## 2017-07-26 NOTE — ED Notes (Signed)
Patient was placed on 2L [employer] due to patient saying he was short of breath and 02 sat was 90.

## 2017-07-26 NOTE — ED Notes (Signed)
Social work speaking with pt and mother.

## 2017-07-26 NOTE — ED Provider Notes (Signed)
Patient evaluated by physical therapy and social work.  Unfortunately, this patient with chronic disability and end-stage renal disease on hemodialysis was abruptly transferred out of incarceration without any healthcare services in place for him.  He was brought to the ED for coordination of all his chronic medical issues.  This cannot be completed immediately but the family does feel that they can care for him at home while home health, PT, skilled nursing are all initiated.  Reviewed consult notes, will order wheelchair and hospital bed.  Medically stable for discharge.   Sharman CheekStafford, Shere Eisenhart, MD 07/26/17 1440

## 2017-07-26 NOTE — ED Notes (Signed)
Per Dr. Scotty CourtStafford, pt is okay to remain unmonitored at this time as we wait for social work as pt will not leave monitor cords hooked up.

## 2017-07-27 ENCOUNTER — Telehealth: Payer: Self-pay | Admitting: Family Medicine

## 2017-07-27 DIAGNOSIS — G8194 Hemiplegia, unspecified affecting left nondominant side: Secondary | ICD-10-CM | POA: Diagnosis not present

## 2017-07-27 DIAGNOSIS — A419 Sepsis, unspecified organism: Secondary | ICD-10-CM | POA: Diagnosis not present

## 2017-07-27 DIAGNOSIS — Z7401 Bed confinement status: Secondary | ICD-10-CM | POA: Diagnosis not present

## 2017-07-27 DIAGNOSIS — Z7901 Long term (current) use of anticoagulants: Secondary | ICD-10-CM | POA: Diagnosis not present

## 2017-07-27 DIAGNOSIS — I5032 Chronic diastolic (congestive) heart failure: Secondary | ICD-10-CM | POA: Diagnosis present

## 2017-07-27 DIAGNOSIS — D631 Anemia in chronic kidney disease: Secondary | ICD-10-CM | POA: Diagnosis present

## 2017-07-27 DIAGNOSIS — J189 Pneumonia, unspecified organism: Secondary | ICD-10-CM | POA: Diagnosis not present

## 2017-07-27 DIAGNOSIS — K219 Gastro-esophageal reflux disease without esophagitis: Secondary | ICD-10-CM | POA: Diagnosis present

## 2017-07-27 DIAGNOSIS — I69391 Dysphagia following cerebral infarction: Secondary | ICD-10-CM | POA: Diagnosis not present

## 2017-07-27 DIAGNOSIS — N186 End stage renal disease: Secondary | ICD-10-CM | POA: Diagnosis present

## 2017-07-27 DIAGNOSIS — R1310 Dysphagia, unspecified: Secondary | ICD-10-CM | POA: Diagnosis present

## 2017-07-27 DIAGNOSIS — R443 Hallucinations, unspecified: Secondary | ICD-10-CM | POA: Diagnosis not present

## 2017-07-27 DIAGNOSIS — I639 Cerebral infarction, unspecified: Secondary | ICD-10-CM | POA: Diagnosis not present

## 2017-07-27 DIAGNOSIS — G92 Toxic encephalopathy: Secondary | ICD-10-CM | POA: Diagnosis not present

## 2017-07-27 DIAGNOSIS — Z7189 Other specified counseling: Secondary | ICD-10-CM | POA: Diagnosis not present

## 2017-07-27 DIAGNOSIS — I080 Rheumatic disorders of both mitral and aortic valves: Secondary | ICD-10-CM | POA: Diagnosis present

## 2017-07-27 DIAGNOSIS — I69354 Hemiplegia and hemiparesis following cerebral infarction affecting left non-dominant side: Secondary | ICD-10-CM | POA: Diagnosis not present

## 2017-07-27 DIAGNOSIS — I340 Nonrheumatic mitral (valve) insufficiency: Secondary | ICD-10-CM | POA: Diagnosis not present

## 2017-07-27 DIAGNOSIS — N2581 Secondary hyperparathyroidism of renal origin: Secondary | ICD-10-CM | POA: Diagnosis present

## 2017-07-27 DIAGNOSIS — I951 Orthostatic hypotension: Secondary | ICD-10-CM | POA: Diagnosis present

## 2017-07-27 DIAGNOSIS — T4395XA Adverse effect of unspecified psychotropic drug, initial encounter: Secondary | ICD-10-CM | POA: Diagnosis not present

## 2017-07-27 DIAGNOSIS — I132 Hypertensive heart and chronic kidney disease with heart failure and with stage 5 chronic kidney disease, or end stage renal disease: Secondary | ICD-10-CM | POA: Diagnosis present

## 2017-07-27 DIAGNOSIS — Z6835 Body mass index (BMI) 35.0-35.9, adult: Secondary | ICD-10-CM | POA: Diagnosis not present

## 2017-07-27 DIAGNOSIS — R5381 Other malaise: Secondary | ICD-10-CM | POA: Diagnosis present

## 2017-07-27 DIAGNOSIS — R0902 Hypoxemia: Secondary | ICD-10-CM | POA: Diagnosis not present

## 2017-07-27 DIAGNOSIS — Z992 Dependence on renal dialysis: Secondary | ICD-10-CM | POA: Diagnosis not present

## 2017-07-27 DIAGNOSIS — J690 Pneumonitis due to inhalation of food and vomit: Secondary | ICD-10-CM | POA: Diagnosis not present

## 2017-07-27 DIAGNOSIS — I359 Nonrheumatic aortic valve disorder, unspecified: Secondary | ICD-10-CM | POA: Diagnosis not present

## 2017-07-27 DIAGNOSIS — G4733 Obstructive sleep apnea (adult) (pediatric): Secondary | ICD-10-CM | POA: Diagnosis present

## 2017-07-27 DIAGNOSIS — Z515 Encounter for palliative care: Secondary | ICD-10-CM | POA: Diagnosis not present

## 2017-07-27 DIAGNOSIS — I351 Nonrheumatic aortic (valve) insufficiency: Secondary | ICD-10-CM | POA: Diagnosis not present

## 2017-07-27 DIAGNOSIS — I8291 Chronic embolism and thrombosis of unspecified vein: Secondary | ICD-10-CM | POA: Diagnosis present

## 2017-07-27 DIAGNOSIS — F419 Anxiety disorder, unspecified: Secondary | ICD-10-CM | POA: Diagnosis present

## 2017-07-27 DIAGNOSIS — F17210 Nicotine dependence, cigarettes, uncomplicated: Secondary | ICD-10-CM | POA: Diagnosis present

## 2017-07-27 LAB — RENAL FUNCTION PANEL
ALBUMIN: 3.8 g/dL (ref 3.5–5.0)
ANION GAP: 16 — AB (ref 5–15)
BUN: 30 mg/dL — ABNORMAL HIGH (ref 6–20)
CALCIUM: 9.9 mg/dL (ref 8.9–10.3)
CO2: 28 mmol/L (ref 22–32)
CREATININE: 7.98 mg/dL — AB (ref 0.61–1.24)
Chloride: 97 mmol/L — ABNORMAL LOW (ref 98–111)
GFR calc non Af Amer: 7 mL/min — ABNORMAL LOW (ref >60)
GFR, EST AFRICAN AMERICAN: 8 mL/min — AB (ref >60)
GLUCOSE: 99 mg/dL (ref 70–99)
Phosphorus: 4.4 mg/dL (ref 2.5–4.6)
Potassium: 3.7 mmol/L (ref 3.5–5.1)
SODIUM: 141 mmol/L (ref 135–145)

## 2017-07-27 LAB — CBC
HCT: 40.1 % (ref 40.0–52.0)
HEMOGLOBIN: 12.4 g/dL — AB (ref 13.0–18.0)
MCH: 27.5 pg (ref 26.0–34.0)
MCHC: 31 g/dL — ABNORMAL LOW (ref 32.0–36.0)
MCV: 88.8 fL (ref 80.0–100.0)
Platelets: 231 10*3/uL (ref 150–440)
RBC: 4.52 MIL/uL (ref 4.40–5.90)
RDW: 19.7 % — ABNORMAL HIGH (ref 11.5–14.5)
WBC: 3.8 10*3/uL (ref 3.8–10.6)

## 2017-07-27 LAB — PROTIME-INR
INR: 2.89
PROTHROMBIN TIME: 30 s — AB (ref 11.4–15.2)

## 2017-07-27 LAB — MRSA PCR SCREENING: MRSA by PCR: POSITIVE — AB

## 2017-07-27 MED ORDER — ONDANSETRON HCL 4 MG PO TABS: 4.0000 mg | ORAL_TABLET | Freq: Four times a day (QID) | ORAL | Status: DC | PRN

## 2017-07-27 MED ORDER — ONDANSETRON HCL 4 MG/2ML IJ SOLN: 4.0000 mg | Freq: Four times a day (QID) | INTRAMUSCULAR | Status: DC | PRN

## 2017-07-27 MED ORDER — BISACODYL 10 MG RE SUPP
10.0000 mg | Freq: Every day | RECTAL | Status: DC | PRN
Start: 2017-07-27 — End: 2017-08-02

## 2017-07-27 MED ORDER — LIDOCAINE HCL (PF) 1 % IJ SOLN
5.0000 mL | INTRAMUSCULAR | Status: DC | PRN
  Filled 2017-07-27: qty 5

## 2017-07-27 MED ORDER — PENTAFLUOROPROP-TETRAFLUOROETH EX AERO
1.0000 "application " | INHALATION_SPRAY | CUTANEOUS | Status: DC | PRN
  Filled 2017-07-27: qty 30

## 2017-07-27 MED ORDER — ALTEPLASE 2 MG IJ SOLR: 2.0000 mg | Freq: Once | INTRAMUSCULAR | Status: DC | PRN

## 2017-07-27 MED ORDER — HEPARIN SODIUM (PORCINE) 1000 UNIT/ML DIALYSIS
1000.0000 [IU] | INTRAMUSCULAR | Status: DC | PRN
  Filled 2017-07-27: qty 1

## 2017-07-27 MED ORDER — TUBERCULIN PPD 5 UNIT/0.1ML ID SOLN
5.0000 [IU] | Freq: Once | INTRADERMAL | Status: AC
  Administered 2017-07-27: 5 [IU] via INTRADERMAL
  Filled 2017-07-27: qty 0.1

## 2017-07-27 MED ORDER — MUPIROCIN 2 % EX OINT
1.0000 "application " | TOPICAL_OINTMENT | Freq: Two times a day (BID) | CUTANEOUS | Status: AC
  Administered 2017-07-27 – 2017-08-01 (×10): 1 via NASAL
  Filled 2017-07-27: qty 22

## 2017-07-27 MED ORDER — SODIUM CHLORIDE 0.9 % IV SOLN: 100.0000 mL | INTRAVENOUS | Status: DC | PRN

## 2017-07-27 MED ORDER — ACETAMINOPHEN 650 MG RE SUPP: 650.0000 mg | Freq: Four times a day (QID) | RECTAL | Status: DC | PRN

## 2017-07-27 MED ORDER — LIDOCAINE-PRILOCAINE 2.5-2.5 % EX CREA
1.0000 "application " | TOPICAL_CREAM | CUTANEOUS | Status: DC | PRN
  Filled 2017-07-27: qty 5

## 2017-07-27 MED ORDER — RESOURCE THICKENUP CLEAR PO POWD
ORAL | Status: DC | PRN
  Filled 2017-07-27: qty 125

## 2017-07-27 MED ORDER — CHLORHEXIDINE GLUCONATE CLOTH 2 % EX PADS
6.0000 | MEDICATED_PAD | Freq: Every day | CUTANEOUS | Status: AC
  Administered 2017-07-29 – 2017-08-01 (×4): 6 via TOPICAL

## 2017-07-27 MED ORDER — ACETAMINOPHEN 325 MG PO TABS: 650.0000 mg | ORAL_TABLET | Freq: Four times a day (QID) | ORAL | Status: DC | PRN

## 2017-07-27 MED ORDER — CHLORHEXIDINE GLUCONATE CLOTH 2 % EX PADS
6.0000 | MEDICATED_PAD | Freq: Every day | CUTANEOUS | Status: DC
  Administered 2017-07-28 – 2017-08-01 (×4): 6 via TOPICAL

## 2017-07-27 NOTE — Progress Notes (Signed)
Patient was very lethargic on admission to floor and sleeping he was not able to successfully answer admission questions. Will try to complete process upon return from dialysis or report over to night shift nurse if patient not back in time to complete.

## 2017-07-27 NOTE — Progress Notes (Signed)
HD Tx    07/27/17 1835  Vital Signs  Pulse Rate 78  Pulse Rate Source Monitor  Resp 17  BP (!) 117/53  BP Location Right Arm  BP Method Automatic  Patient Position (if appropriate) Lying  Oxygen Therapy  SpO2 100 %  O2 Device Nasal Cannula  O2 Flow Rate (L/min) 2 L/min  During Hemodialysis Assessment  Intra-Hemodialysis Comments Tx completed (Pt verbalizes no complaints)  Complete

## 2017-07-27 NOTE — Progress Notes (Signed)
[facility]  ROUNDING NOTE   Subjective:  Patient well-known to us from prior outpatient care. Patient was apparently released from [facility] recently. He did not have outpatient hemodialysis established anywhere. He was subsequently brought to our hospital. His last dialysis was on Monday. While incarcerated it appears that he suffered a severe CVA and has left-sided hemiparesis.   Objective:  Vital signs in last 24 hours:  Temp:  [97.7 F (36.5 C)] 97.7 F (36.5 C) (07/11 1248) Pulse Rate:  [60-99] 84 (07/11 1248) Resp:  [14-27] 16 (07/11 1248) BP: (82-131)/(19-49) 107/47 (07/11 1248) SpO2:  [93 %-100 %] 100 % (07/11 1248)  Weight change:  Filed Weights   07/25/17 1807  Weight: 122.5 kg (270 lb)    Intake/Output: I/O last 3 completed shifts: In: 250 [IV Piggyback:250] Out: -    Intake/Output this shift:  No intake/output data recorded.  Physical Exam: General: No acute distress  Head: Normocephalic, atraumatic. Moist oral mucosal membranes  Eyes: Anicteric  Neck: Supple, trachea midline  Lungs:  Scattered rhonchi, normal effort  Heart: S1S2 no rubs  Abdomen:  Soft, nontender, bowel sounds present  Extremities: signfiicant swelling of LUE and LLE, RUE/RLE to a lesser extent.  Neurologic: Lethargic but arousable  Skin: No rashes  Access: L IJ permcath    Basic Metabolic Panel: Recent Labs  Lab 07/25/17 1906 07/26/17 1542  NA 143 142  K 3.2* 3.4*  CL 101 101  CO2 31 30  GLUCOSE 94 123*  BUN 20 25*  CREATININE 6.06* 7.49*  CALCIUM 10.0 9.8    Liver Function Tests: Recent Labs  Lab 07/25/17 1906  AST 14*  ALT 15  ALKPHOS 83  BILITOT 0.8  PROT 6.2*  ALBUMIN 3.1*   No results for input(s): LIPASE, AMYLASE in the last 168 hours. No results for input(s): AMMONIA in the last 168 hours.  CBC: Recent Labs  Lab 07/25/17 1906 07/26/17 1542  WBC 4.2 4.4  NEUTROABS  --  3.6  HGB 12.5* 12.2*  HCT 38.8* 39.3*  MCV 86.9 88.0   PLT 225 231    Cardiac Enzymes: No results for input(s): CKTOTAL, CKMB, CKMBINDEX, TROPONINI in the last 168 hours.  BNP: Invalid input(s): POCBNP  CBG: No results for input(s): GLUCAP in the last 168 hours.  Microbiology: Results for orders placed or performed during the hospital encounter of 07/11/16  MRSA PCR Screening     Status: Abnormal   Collection Time: 07/11/16 10:19 PM  Result Value Ref Range Status   MRSA by PCR POSITIVE (A) NEGATIVE Final    Comment:        The GeneXpert MRSA Assay (FDA approved for NASAL specimens only), is one component of a comprehensive MRSA colonization surveillance program. It is not intended to diagnose MRSA infection nor to guide or monitor treatment for MRSA infections. RESULT CALLED TO, READ BACK BY AND VERIFIED WITH: OLIVIA ROGERS @ 0026 ON 07/12/2016 BY CAF     Coagulation Studies: Recent Labs    07/26/17 1542  LABPROT 31.1*  INR 3.02    Urinalysis: No results for input(s): COLORURINE, LABSPEC, PHURINE, GLUCOSEU, HGBUR, BILIRUBINUR, KETONESUR, PROTEINUR, UROBILINOGEN, NITRITE, LEUKOCYTESUR in the last 72 hours.  Invalid input(s): APPERANCEUR    Imaging: No results found.   Medications:   . sodium chloride    . sodium chloride     . calcium acetate  2,001 mg Oral TID WC  . [START ON 07/28/2017] Chlorhexidine Gluconate Cloth  6 each Topical Q0600  .  cinacalcet  60 mg Oral QHS  . escitalopram  20 mg Oral QHS  . gabapentin  300 mg Oral QHS  . loratadine  10 mg Oral Daily  . midodrine  15 mg Oral Q8H  . multivitamin with minerals  1 tablet Oral Daily  . pantoprazole  40 mg Oral Daily  . sevelamer carbonate  3,200 mg Oral TID WC  . tuberculin  5 Units Intradermal Once  . warfarin  4 mg Oral QHS  . Warfarin - Physician Dosing Inpatient   Does not apply q1800   sodium chloride, sodium chloride, acetaminophen **OR** acetaminophen, acetaminophen, alprazolam, alteplase, bisacodyl, diphenhydrAMINE, heparin, lidocaine  (PF), lidocaine-prilocaine, lidocaine-prilocaine, ondansetron **OR** ondansetron (ZOFRAN) IV, pentafluoroprop-tetrafluoroeth, polyethylene glycol, RESOURCE THICKENUP CLEAR, traMADol  Assessment/ Plan:  47 y.o. male with a PMHx of End-stage renal disease on hemodialysis MWF followed by [employer], chronic back pain, GERD, hypertension, seizure disorder, obstructive sleep apnea, anemia chronic kidney disease, secondary hyperparathyroidism, anxiety who is now admitted for reestablishing outpt dialysis in [facility].   1.  ESRD on HD MWF.  Patient was receiving dialysis on Monday, Wednesday, Friday schedule at [facility].  We will plan for hemodialysis today and get him back on scheduled tomorrow as well.  We will use his left internal jugular PermCath.  2.  Anemia of chronic kidney disease.  Hemoglobin currently 12.2.  No indication for Procrit at the moment.  3.  Secondary hyperparathyroidism.  Patient previously receiving Renvela 3200 mg p.o. 3 times daily.  Unclear as to whether he will be able to swallow these.  Speech therapy to evaluate the patient.  4.  Hypotension.  Blood pressure actually low at 107/47.  Continue midodrine 15 mg p.o. every 8 hours.     LOS: 0 Man Bonneau 7/11/20192:40 PM

## 2017-07-27 NOTE — ED Provider Notes (Signed)
-----------------------------------------   7:01 AM on 07/27/2017 -----------------------------------------  No events overnight.  Patient's blood pressure stabilized and began to trend upwards.  Most recent blood pressure 115/37.  Remains in the ED pending clinical social work consult.  If patient is unable to be placed in a nursing home before the afternoon, he will require dialysis.   Irean HongSung, [name], MD 07/27/17 [phone]

## 2017-07-27 NOTE — ED Notes (Addendum)
Patient's BP continues to be low, despite NS 250ml bolus. Dr. Dolores FrameSung aware. Discussed patient's normally hypotensive status (normally 90's systolic at home per family to staff on previous shifts). Per Dr. Dolores FrameSung, patient to have BP checked again at 0200. Patient asymptomatic at this time, sleeping soundly.

## 2017-07-27 NOTE — ED Notes (Signed)
Pt sleeping at this time, resting on inpatient hospital bed with equal and unlabored resp noted, will continue to monitor.
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
## 2017-07-27 NOTE — Progress Notes (Signed)
Pre HD Assessment   07/27/17 1455  Neurological  Level of Consciousness Responds to Voice  Orientation Level Oriented to person;Oriented to place;Oriented to time  Respiratory  Respiratory Pattern Irregular;Tachypnea  Chest Assessment Chest expansion symmetrical  Bilateral Breath Sounds Rhonchi  Cough None  Cardiac  Pulse Regular  Heart Sounds S1, S2  ECG Monitor Yes  Vascular  R Radial Pulse +2  L Radial Pulse +2  Edema Left upper extremity  RUE Edema +1  LUE Edema +2  Integumentary  Integumentary (WDL) X  Skin Color Appropriate for ethnicity  Skin Condition Dry;Flaky  Musculoskeletal  Musculoskeletal (WDL) X  Generalized Weakness Yes  GU Assessment  Genitourinary (WDL) X  Genitourinary Symptoms  (HD)

## 2017-07-27 NOTE — ED Notes (Signed)
Dr. Dolores FrameSung aware of patient's current BP (96/19). Patient to continue to be monitored at this time with no additional treatments.

## 2017-07-27 NOTE — Progress Notes (Signed)
ANTICOAGULATION CONSULT NOTE  Pharmacy Consult for Warfarin Indication: CVA/AF?  Allergies  Allergen Reactions  . Depakote [Divalproex Sodium] Other (See Comments)    Hallucinations     Patient Measurements: Height: 5\' 6"  (167.6 cm) Weight: 270 lb (122.5 kg) IBW/kg (Calculated) : 63.8  Vital Signs: BP: 121/42 (07/11 1020) Pulse Rate: 84 (07/11 1030)  Labs: Recent Labs    07/25/17 1906 07/26/17 1542  HGB 12.5* 12.2*  HCT 38.8* 39.3*  PLT 225 231  LABPROT  --  31.1*  INR  --  3.02  CREATININE 6.06* 7.49*    Estimated Creatinine Clearance: 15.2 mL/min (A) (by C-G formula based on SCr of 7.49 mg/dL (H)).   Medical History: Past Medical History:  Diagnosis Date  . Acute meniscal tear of left knee   . Acute meniscal tear of right knee   . Anxiety   . Chronic back pain    due to weight   . Dialysis patient (HCC)   . Fracture of transverse process of thoracic vertebra (HCC)    [facility]- [facility] by neurosurgery  . GERD (gastroesophageal reflux disease)   . Hemodialysis patient [facility](HCC)    since 2011, does hemodialysis at home, wife does she is a [employer] , Daily except wed and sun  . High blood pressure    hx of   . Kidney failure    on [facility] since [facility]- [facility]- nephrologist   . Seizures (HCC)   . 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 PTA dosing of 4 alternating with 4.5 mg warfarin daily.   Goal of Therapy:  INR 2-3  Date  INR  Coumadin dose   7/10  3.02  4 mg 7/11  2.89   A/P: Will continue warfarin 4 mg daily for now and f/u AM INR.   Luisa HartChristy, Jahaad Penado D 07/27/2017,11:01 AM

## 2017-07-27 NOTE — Progress Notes (Signed)
HD Tx Post Assessment   07/27/17 1845  Vital Signs  Temp 97.6 F (36.4 C)  Temp Source Oral  Pulse Rate 78  Pulse Rate Source Monitor  Resp 16  BP (!) 112/47  BP Location Right Arm  BP Method Automatic  Patient Position (if appropriate) Lying  Oxygen Therapy  SpO2 100 %  O2 Device Nasal Cannula  O2 Flow Rate (L/min) 2 L/min  Pain Assessment  Pain Scale 0-10  Pain Score 0  Dialysis Weight  Weight 100 kg (220 lb 7.4 oz)  Type of Weight Post-Dialysis  Post-Hemodialysis Assessment  Rinseback Volume (mL) 250 mL  KECN 74.9 V  Dialyzer Clearance Lightly streaked  Duration of HD Treatment -hour(s) 3.5 hour(s)  Hemodialysis Intake (mL) 250 mL  UF Total -Machine (mL) 1017 mL  Net UF (mL) 767 mL  Tolerated HD Treatment Yes  Post-Hemodialysis Comments Cath limbs blocked with heparin post tx and capped. (Tx complete with no complications and pt tol tx well. )

## 2017-07-27 NOTE — Progress Notes (Signed)
Post HD Tx Assessment   07/27/17 1700  Neurological  Level of Consciousness Responds to Voice (Asleep and Arouses easily upon name call)  Orientation Level Oriented to person;Oriented to place;Oriented to time;Oriented to situation  Respiratory  Respiratory Pattern Irregular;Unlabored  Chest Assessment Chest expansion symmetrical  Bilateral Breath Sounds Diminished  Cough None  Cardiac  Pulse Regular  Heart Sounds S1, S2  ECG Monitor Yes  Vascular  R Radial Pulse +2  L Radial Pulse +2  Integumentary  Integumentary (WDL) X  Skin Color Appropriate for ethnicity  Skin Condition Dry;Flaky  Musculoskeletal  Musculoskeletal (WDL) X  Generalized Weakness Yes  Gastrointestinal  Bowel Sounds Assessment Active  GU Assessment  Genitourinary (WDL) X  Genitourinary Symptoms  (HD)
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
## 2017-07-27 NOTE — Progress Notes (Signed)
Pre HD Assessment   07/27/17 1454  Vital Signs  Temp (!) 97.5 F (36.4 C)  Temp Source Oral  Pulse Rate 80  Resp 16  BP (!) 121/39  BP Location Right Arm  BP Method Automatic  Patient Position (if appropriate) Lying  Oxygen Therapy  SpO2 100 %  O2 Device Nasal Cannula  O2 Flow Rate (L/min) 2 L/min  Pain Assessment  Pain Scale 0-10  Dialysis Weight  Weight 100.9 kg (222 lb 7.1 oz)  Type of Weight Pre-Dialysis  Time-Out for Hemodialysis  What Procedure? HD  Pt Identifiers(min of two) First/Last Name;MRN/Account#  Correct Site? Yes  Correct Side? Yes  Correct Procedure? Yes  Consents Verified? Yes  Rad Studies Available? N/A  Safety Precautions Reviewed? Yes  Biochemist, clinicalMachine Checks  Machine Number  (1A)  Station Number  (bay 3)  UF/Alarm Test Passed  Conductivity: Meter 13.6  Conductivity: Machine  13.9  pH 7.4  Reverse Osmosis Main  Normal Saline Lot Number [id number]  Dialyzer Lot Number [id number]  Disposable Set Lot Number [id number]  Machine Temperature 96.8 F (36 C)  ImmunologistAir Detector Armed and Audible Yes  Blood Lines Intact and Secured Yes  Pre Treatment Patient Checks  Vascular access used during treatment Catheter  Hepatitis B Surface Antigen Results  (UNK)  Isolation Initiated Yes  Hepatitis B Surface Antibody  Perry [name](UNK)  Date Hepatitis B Surface Antibody Drawn 07/27/17  Hemodialysis Consent Verified Yes  Hemodialysis Standing Orders Initiated Yes  ECG (Telemetry) Monitor On Yes  Prime Ordered Normal Saline  Length of  DialysisTreatment -hour(s) 3.5 Hour(s)  Dialyzer Elisio 17H NR  Dialysate 2K, 2.5 Ca  Dialysate Flow Ordered 800  Blood Flow Rate Ordered 400 mL/min  Ultrafiltration Goal 0.5 Liters  Pre Treatment Labs CBC;Hepatitis B Surface Antigen;Renal panel (PTH)  Dialysis Blood Pressure Support Ordered Normal Saline  Education / Care Plan  Dialysis Education Provided Yes  Documented Education in Care Plan Yes

## 2017-07-27 NOTE — Care Management (Addendum)
RNCM received callback from nurse with Dr. Felisa BonierPickards office and Dr. Tanya NonesPickard has agree to resume care with patient.  His appointment is on 08/01/17 at 345P. RNCM expects that patient will need assistance with transportation- long term to appointments including outpatient hemodialysis.  RNCM has requested assistance from [place] with [facility] to assist with hemodialysis chair establishment.  Message left for patient's mother Ms. Toni ArthursFuller to call this RNCM to discuss. Brad with [facility] updated on PCP appointment. It is unlikely that any long-term care facility would take patient however, MD has requested social worker involvement in home care.

## 2017-07-27 NOTE — Care Management (Addendum)
Need HepB core, antigen and Antibody lab along with PPD placement and read in 48-72 hours. This is a must for outpatient hemodialysis which he does not have established. Marylene Landngela [phone]. RNCM spoke with ED Charge and ED Provider.

## 2017-07-27 NOTE — Progress Notes (Signed)
HD TX Started   07/27/17 1504  Vital Signs  Pulse Rate 87  Pulse Rate Source Monitor  Resp 17  BP (!) 121/46  BP Location Right Arm  BP Method Automatic  Patient Position (if appropriate) Lying  Oxygen Therapy  SpO2 100 %  O2 Device Nasal Cannula  O2 Flow Rate (L/min) 2 L/min  During Hemodialysis Assessment  Blood Flow Rate (mL/min) 400 mL/min  Arterial Pressure (mmHg) -190 mmHg  Venous Pressure (mmHg) 210 mmHg  Transmembrane Pressure (mmHg) 50 mmHg  Ultrafiltration Rate (mL/min) 290 mL/min  Dialysate Flow Rate (mL/min) 800 ml/min  Conductivity: Machine  13.9  HD Safety Checks Performed Yes  Dialysis Fluid Bolus Normal Saline  Bolus Amount (mL) 250 mL  Intra-Hemodialysis Comments Tx initiated

## 2017-07-28 LAB — PROTIME-INR
INR: 3.33
Prothrombin Time: 33.5 seconds — ABNORMAL HIGH (ref 11.4–15.2)

## 2017-07-28 LAB — HEPATITIS B SURFACE ANTIBODY, QUANTITATIVE: Hepatitis B-Post: 3.2 m[IU]/mL — ABNORMAL LOW (ref >9.9)

## 2017-07-28 LAB — PARATHYROID HORMONE, INTACT (NO CA): PTH: 132 pg/mL — AB (ref 15–65)

## 2017-07-28 LAB — PHOSPHORUS: Phosphorus: 3.7 mg/dL (ref 2.5–4.6)

## 2017-07-28 LAB — HEPATITIS B SURFACE ANTIGEN: HEP B S AG: NEGATIVE

## 2017-07-28 MED ORDER — TUBERCULIN PPD 5 UNIT/0.1ML ID SOLN
5.0000 [IU] | Freq: Once | INTRADERMAL | Status: DC
  Filled 2017-07-28: qty 0.1

## 2017-07-28 MED ORDER — HEPARIN SODIUM (PORCINE) 5000 UNIT/ML IJ SOLN: 5000.0000 [IU] | Freq: Three times a day (TID) | INTRAMUSCULAR | Status: DC

## 2017-07-28 NOTE — Progress Notes (Signed)
Physical Therapy Treatment Patient Details Name: Perry [name] MRN: [id number] DOB: 03-30-1970 Today's Date: 07/28/2017    History of Present Illness Pt is a 47 y/o M who presents from prison who was brought as they were unable to provide required care for the pt.  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      PT Comments    Patient asleep in bed at start of session, easily woken and willing to work with PT, no complaints of pain. Patient BP assess in lying with cuff on forearm, 79/49 nursing notified immediately. RN entered room, informed that cuff should be placed on forearm for more accurate reading, BP 124/44. Patient able to participate in bed level therapeutic exercises, AAROM for LLE.  Patient instructed in rolling technique (to L), patient able to participate by grabbing bed rail and moving RLE, total assist needed for LUE movement and modAx1 for LLE. Patient able to sit EOB with mod Ax2 for ~66minutes with no complaints. Returned to supine and repositioned with total assist. Patient supine in bed with all needs in reach and family at bedside at end of session.    Follow Up Recommendations  SNF;Supervision/Assistance - 24 hour     Equipment Recommendations  Wheelchair (measurements PT);Wheelchair cushion (measurements PT);Hospital bed;Other (comment)(hoyer lift)    Recommendations for Other Services       Precautions / Restrictions Restrictions Weight Bearing Restrictions: No    Mobility  Bed Mobility Overal bed mobility: Needs Assistance Bed Mobility: Rolling;Supine to Sit Rolling: Mod assist;+2 for physical assistance   Supine to sit: Mod assist;+2 for physical assistance     General bed mobility comments: Patient mod Ax2 to roll and perform supine to sit transfer. Care taken with LUE.   Transfers                 General transfer comment: Unable to attempt due to  weakness/poor trunk control  Ambulation/Gait                 Stairs             Wheelchair Mobility    Modified Rankin (Stroke Patients Only)       Balance Overall balance assessment: Needs assistance Sitting-balance support: Bilateral upper extremity supported;Feet unsupported Sitting balance-Leahy Scale: Zero Sitting balance - Comments: Patient needed min Ax2 to maintain seated position, or mod Ax1 to maintain seated position.                                    Cognition                                              Exercises General Exercises - Lower Extremity Ankle Circles/Pumps: AAROM;Left;10 reps;AROM;Right Short Arc Quad: AAROM;Left;20 reps;AROM;Right Heel Slides: AROM;Right;20 reps;AAROM;Left    General Comments General comments (skin integrity, edema, etc.): BP on bicep very low, 79/49 nursing staff alerted. BP cuff placed on on forearm per nursing, 124/44.      Pertinent Vitals/Pain Pain Assessment: No/denies pain    Home Living                      Prior Function  PT Goals (current goals can now be found in the care plan section) Acute Rehab PT Goals Patient Stated Goal: to go to rehab at d/c PT Goal Formulation: With patient Time For Goal Achievement: 08/09/17 Potential to Achieve Goals: Fair Progress towards PT goals: Progressing toward goals    Frequency    Min 2X/week      PT Plan Current plan remains appropriate    Co-evaluation              AM-PAC PT "6 Clicks" Daily Activity  Outcome Measure  Difficulty turning over in bed (including adjusting bedclothes, sheets and blankets)?: Unable Difficulty moving from lying on back to sitting on the side of the bed? : Unable Difficulty sitting down on and standing up from a chair with arms (e.g., wheelchair, bedside commode, etc,.)?: Unable Help needed moving to and from a bed to chair (including a wheelchair)?:  Total Help needed walking in hospital room?: Total Help needed climbing 3-5 steps with a railing? : Total 6 Click Score: 6    End of Session Equipment Utilized During Treatment: Oxygen Activity Tolerance: Patient limited by fatigue Patient left: in bed;with call bell/phone within reach;with SCD's reapplied;with bed alarm set Nurse Communication: Mobility status PT Visit Diagnosis: Muscle weakness (generalized) (M62.81);Hemiplegia and hemiparesis Hemiplegia - Right/Left: Left Hemiplegia - dominant/non-dominant: Non-dominant Hemiplegia - caused by: Cerebral infarction     Time: 0225-0259 PT Time Calculation (min) (ACUTE ONLY): 34 min  Charges:  $Therapeutic Exercise: 8-22 mins $Therapeutic Activity: 8-22 mins                    G Codes:      [name] PT, DPT 3:18 PM,07/28/17 [phone]

## 2017-07-28 NOTE — Progress Notes (Signed)
ANTICOAGULATION CONSULT NOTE  Pharmacy Consult for Warfarin Indication: CVA/AF?  Allergies  Allergen Reactions  . Depakote [Divalproex Sodium] Other (See Comments)    Hallucinations     Patient Measurements: Height: 5\' 6"  (167.6 cm) Weight: 220 lb 7.4 oz (100 kg) IBW/kg (Calculated) : 63.8  Vital Signs: Temp: 97.5 F (36.4 C) (07/12 0556) Temp Source: Oral (07/12 0556) BP: 108/40 (07/12 0556) Pulse Rate: 89 (07/12 0556)  Labs: Recent Labs    07/25/17 1906 07/26/17 1542 07/27/17 1403 07/27/17 1430 07/27/17 1541 07/28/17 0414  HGB 12.5* 12.2*  --   --  12.4*  --   HCT 38.8* 39.3*  --   --  40.1  --   PLT 225 231  --   --  231  --   LABPROT  --  31.1*  --  30.0*  --  33.5*  INR  --  3.02  --  2.89  --  3.33  CREATININE 6.06* 7.49* 7.98*  --   --   --     Estimated Creatinine Clearance: 12.8 mL/min (A) (by C-G formula based on SCr of 7.98 mg/dL (H)).   Medical History: Past Medical History:  Diagnosis Date  . Acute meniscal tear of left knee   . Acute meniscal tear of right knee   . Anxiety   . Chronic back pain    due to weight   . Dialysis patient (HCC)   . Fracture of transverse process of thoracic vertebra (HCC)    [facility] by neurosurgery  . GERD (gastroesophageal reflux disease)   . Hemodialysis patient [facility](HCC)    since 2011, does hemodialysis at home, wife does she is a [employer] , Daily except wed and sun  . High blood pressure    hx of   . Kidney failure    on dialysis since 2011- [facility]- nephrologist   . Seizures (HCC)   . 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 PTA dosing of 4 alternating with 4.5 mg warfarin daily.   Goal of Therapy:  INR 2-3  Date  INR  Coumadin dose   7/10  3.02  4 mg 7/11  2.89  4 mg 7/12  3.33  HOLD    A/P: Will hold warfarin for today and f/u AM INR.   Perry [name], PharmD 07/28/2017,7:12 AM

## 2017-07-28 NOTE — Clinical Social Work Note (Signed)
Clinical Social Work Assessment  Patient Details  Name: Perry [name] MRN: [id number] Date of Birth: [birth year]  Date of referral:  07/28/17               Reason for consult:  Discharge Planning                Permission sought to share information with:    Permission granted to share information::     Name::        Agency::     Relationship::     Contact Information:     Housing/Transportation Living arrangements for the past 2 months:  [facility] Source of Information:  Parent Patient Interpreter Needed:  None Criminal Activity/Legal Involvement Pertinent to Current Situation/Hospitalization:  Yes Significant Relationships:  Parents Lives with:  Parents Do you feel safe going back to the place where you live?  Yes Need for family participation in patient care:  Yes (Comment)  Care giving concerns:  Patient was released from prison after living in prison for a year and a half into his mother's care.   Social Worker assessment / plan:  Patient's mother had requested to see CSW. CSW spoke with her and patient's aunt in the hallway as PT was working with patient in the room Patient's mother was wanting to know how to get the medicaid application due to her having gone to dept of social services and was told she could get a medicaid application here. CSW explained that the hospital typically does not assist with medicaid application process unless the patient is self pay and then the financial dept will begin the process. CSW has left a message for the DSS medicaid liason [facility], at ext: [phone].   Patient's mother then asked about the potential for short term rehab. CSW explained that we can do a bed search but with patient's pending legal charges for allegedly murdering his wife, the facilities will do a background check and will more than likely deny him based on his pending criminal charges. Patient's mom verbalized understanding. Patient's mother  stated that she wanted to search [place] only as she did not wish to travel. Patient's mother aware that an outpatient dialysis center/chair time is being searched for. CSW advised patient's mother that she should plan on taking patient home at this time with home health. CSW answered questions regarding home health and equipment. Patient's mother did not know that [facility] had declined the referral and would not be able to take patient for home health nor for the equipment. CSW was informed by RN CM this afternoon that she was able to obtain services for home health through [facility]. CSW answered questions that the patient's mother and aunt had.   Employment status:  Disabled (Comment on whether or not currently receiving Disability) Insurance information:  Medicare PT Recommendations:  Skilled Nursing Facility Information / Referral to community resources:     Patient/Family's Response to care:  Patient's mother and aunt expressed appreciation for CSW assistance.   Patient/Family's Understanding of and Emotional Response to Diagnosis, Current Treatment, and Prognosis:  Patient's mom and aunt needed clarification regarding services and processes and CSW provided this.   Emotional Assessment Appearance:    Attitude/Demeanor/Rapport:    Affect (typically observed):    Orientation:  Oriented to Self, Oriented to Place, Oriented to  Time, Oriented to Situation Alcohol / Substance use:  Not Applicable Psych involvement (Current and /or in the community):  Outpatient Provider  Discharge  Needs  Concerns to be addressed:  Care Coordination Readmission within the last 30 days:  No Current discharge risk:  Legal Concerns Barriers to Discharge:  Waiting for outpatient dialysis   York Spaniel, LCSW 07/28/2017, 3:41 PM

## 2017-07-28 NOTE — NC FL2 (Signed)
MEDICAID FL2 LEVEL OF CARE SCREENING TOOL     IDENTIFICATION  Patient Name: Perry [name] Birthdate: [birth year] Sex: male Admission Date (Current Location): 07/25/2017  [facility] [place]   Facility and Address:  [facility], [address], [place], [place] [address]      Provider Number: [id number]  Attending Physician Name and Address:  Auburn BilberryPatel, Shreyang, MD  Relative Name and Phone Number:       Current Level of Care: Hospital Recommended Level of Care: Skilled Nursing Facility Prior Approval Number:    Date Approved/Denied:   PASRR Number:    Discharge Plan: SNF    Current Diagnoses: Patient Active Problem List   Diagnosis Date Noted  . Solitary pulmonary nodule 05/08/2015  . CHF (congestive heart failure) (HCC) 05/06/2015  . Hyperkalemia 05/06/2015  . Acute encephalopathy 05/06/2015  . OSA on CPAP 05/06/2015  . Morbid obesity (HCC) 05/06/2015  . Fracture of transverse process of thoracic vertebra (HCC)   . 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 Enteritis due to Clostridium difficile   . Hypernatremia   . Convulsion (HCC)   . ESRD (end stage renal disease) (HCC) 08/07/2014  . Altered mental status 08/07/2014  . 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 Epigastric abdominal pain   . Abdominal pain, epigastric 11/17/2013  . Hypotension 11/17/2013  . Abdominal pain 11/16/2013  . Seizures (HCC) 06/06/2012  . ESRD on dialysis (HCC) 06/06/2012  . Anemia of chronic disease 06/06/2012  . Secondary hyperparathyroidism (of renal origin) 06/06/2012  . HTN (hypertension) 06/06/2012  . Arteriovenous fistula occlusion (HCC) 06/06/2012    Orientation RESPIRATION BLADDER Height & Weight     Self, Time, Situation, Place  O2, Normal(2) Continent Weight: 214 lb 14.4 oz (97.5 kg) Height:  5\' 6"  (167.6 cm)  BEHAVIORAL SYMPTOMS/MOOD NEUROLOGICAL BOWEL NUTRITION STATUS  (none) (none) Continent Diet(renal)   AMBULATORY STATUS COMMUNICATION OF NEEDS Skin   Total Care Verbally Normal                       Personal Care Assistance Level of Assistance              Functional Limitations Info  (none)          SPECIAL CARE FACTORS FREQUENCY  (outpatient dialysis)                    Contractures Contractures Info: Not present    Additional Factors Info  Code Status Code Status Info: full             Current Medications (07/28/2017):  This is the current hospital active medication list Current Facility-Administered Medications  Medication Dose Route Frequency Provider Last Rate Last Dose  . acetaminophen (TYLENOL) tablet 650 mg  650 mg Oral Q6H PRN Enedina FinnerPatel, Sona, MD       Or  . acetaminophen (TYLENOL) suppository 650 mg  650 mg Rectal Q6H PRN Enedina FinnerPatel, Sona, MD      . acetaminophen (TYLENOL) tablet 650 mg  650 mg Oral Q6H PRN Arnaldo NatalMalinda, Paul F, MD      . ALPRAZolam Prudy Feeler(XANAX) tablet 2 mg  2 mg Oral QHS PRN Arnaldo NatalMalinda, Paul F, MD   1 mg at 07/26/17 2216  . bisacodyl (DULCOLAX) suppository 10 mg  10 mg Rectal Daily PRN Enedina FinnerPatel, Sona, MD      . calcium acetate (PHOSLO) capsule 2,001 mg  2,001 mg Oral TID WC Arnaldo NatalMalinda, Paul F, MD  2,001 mg at 07/28/17 1310  . Chlorhexidine Gluconate Cloth 2 % PADS 6 each  6 each Topical Q0600 Mady Haagensen, MD   6 each at 07/28/17 0447  . Chlorhexidine Gluconate Cloth 2 % PADS 6 each  6 each Topical Q0600 Enedina Finner, MD      . cinacalcet (SENSIPAR) tablet 60 mg  60 mg Oral QHS Arnaldo Natal, MD   60 mg at 07/27/17 2104  . diphenhydrAMINE (BENADRYL) capsule 25 mg  25 mg Oral BID PRN Arnaldo Natal, MD      . escitalopram (LEXAPRO) tablet 20 mg  20 mg Oral QHS Arnaldo Natal, MD   20 mg at 07/27/17 2104  . gabapentin (NEURONTIN) capsule 300 mg  300 mg Oral QHS Arnaldo Natal, MD   300 mg at 07/27/17 2146  . lidocaine-prilocaine (EMLA) cream 1 application  1 application Topical PRN Arnaldo Natal, MD      . loratadine (CLARITIN) tablet 10 mg  10  mg Oral Daily Arnaldo Natal, MD   10 mg at 07/28/17 0952  . midodrine (PROAMATINE) tablet 15 mg  15 mg Oral Q8H Arnaldo Natal, MD   15 mg at 07/28/17 0952  . multivitamin with minerals tablet 1 tablet  1 tablet Oral Daily Arnaldo Natal, MD   Stopped at 07/28/17 [phone]  . mupirocin ointment (BACTROBAN) 2 % 1 application  1 application Nasal BID Enedina Finner, MD   1 application at 07/28/17 0953  . ondansetron (ZOFRAN) tablet 4 mg  4 mg Oral Q6H PRN Enedina Finner, MD       Or  . ondansetron [facility]) injection 4 mg  4 mg Intravenous Q6H PRN Enedina Finner, MD      . pantoprazole (PROTONIX) EC tablet 40 mg  40 mg Oral Daily Arnaldo Natal, MD   40 mg at 07/28/17 0952  . polyethylene glycol (MIRALAX / GLYCOLAX) packet 17 g  1 packet Oral Daily PRN Arnaldo Natal, MD      . RESOURCE THICKENUP CLEAR   Oral PRN Dionne Bucy, MD      . sevelamer carbonate (RENVELA) tablet 3,200 mg  3,200 mg Oral TID WC Arnaldo Natal, MD   3,200 mg at 07/28/17 1311  . traMADol (ULTRAM) tablet 50 mg  50 mg Oral Q12H PRN Arnaldo Natal, MD      . tuberculin injection 5 Units  5 Units Intradermal Once Enedina Finner, MD   5 Units at 07/27/17 1250  . Warfarin - Physician Dosing Inpatient   Does not apply q1800 Gardner Candle, [facility]         Discharge Medications: Please see discharge summary for a list of discharge medications.  Relevant Imaging Results:  Relevant Lab Results:   Additional Information ss: [id number]  York Spaniel, LCSW

## 2017-07-28 NOTE — Care Management (Signed)
Notified by Nida BoatmanBrad from [facility] on 07/27/17 that due to the patient's history they will not be able to provide home health services, or provide any medical equipment.

## 2017-07-28 NOTE — Progress Notes (Signed)
[employer] - [place] at [facility]                                                                                                                                                                                  Patient Demographics   Perry [name], is a 47 y.o. male, DOB - 09/28/1970, ZOX:[id number]  Admit date - 07/25/2017   Admitting Physician Enedina Finner, MD  Outpatient Primary MD for the patient is Donita Brooks, MD   LOS - 1  Subjective: Pt is dropped off by family for him to be dialyzed Now awaiting out patient dialsysis set up    Review of Systems:   CONSTITUTIONAL: No documented fever. No fatigue, weakness. No weight gain, no weight loss.  EYES: No blurry or double vision.  ENT: No tinnitus. No postnasal drip. No redness of the oropharynx.  RESPIRATORY: No cough, no wheeze, no hemoptysis. No dyspnea.  CARDIOVASCULAR: No chest pain. No orthopnea. No palpitations. No syncope.  GASTROINTESTINAL: No nausea, no vomiting or diarrhea. No abdominal pain. No melena or hematochezia.  GENITOURINARY: No dysuria or hematuria.  ENDOCRINE: No polyuria or nocturia. No heat or cold intolerance.  HEMATOLOGY: No anemia. No bruising. No bleeding.  INTEGUMENTARY: No rashes. No lesions.  MUSCULOSKELETAL: No arthritis. No swelling. No gout.  NEUROLOGIC: No numbness, tingling, or ataxia. No seizure-type activity.  PSYCHIATRIC: No anxiety. No insomnia. No ADD.    Vitals:   Vitals:   07/28/17 0556 07/28/17 0953 07/28/17 1242 07/28/17 1420  BP: (!) 108/40 (!) 116/42 (!) 110/44   Pulse: 89 92 89   Resp: 20  17   Temp: (!) 97.5 F (36.4 C)  (!) 97.5 F (36.4 C)   TempSrc: Oral  Oral   SpO2: 100%  99%   Weight:    97.5 kg (214 lb 14.4 oz)  Height:    5\' 6"  (1.676 m)    Wt Readings from Last 3 Encounters:  07/28/17 97.5 kg (214 lb 14.4 oz)  07/11/16 103 kg (227 lb 1.2 oz)  05/08/15 117.8 kg (259 lb 11.2 oz)     Intake/Output Summary (Last 24 hours) at 07/28/2017  1438 Last data filed at 07/28/2017 0929 Gross per 24 hour  Intake 240 ml  Output 517 ml  Net -277 ml    Physical Exam:   GENERAL: Pleasant-appearing in no apparent distress.  HEAD, EYES, EARS, NOSE AND THROAT: Atraumatic, normocephalic. Extraocular muscles are intact. Pupils equal and reactive to light. Sclerae anicteric. No conjunctival injection. No oro-pharyngeal erythema.  NECK: Supple. There is no jugular venous distention. No bruits, no lymphadenopathy, no thyromegaly.  HEART: Regular rate and rhythm,. No murmurs, no rubs, no  clicks.  LUNGS: Clear to auscultation bilaterally. No rales or rhonchi. No wheezes.  ABDOMEN: Soft, flat, nontender, nondistended. Has good bowel sounds. No hepatosplenomegaly appreciated.  EXTREMITIES: No evidence of any cyanosis, clubbing, or peripheral edema.  +2 pedal and radial pulses bilaterally.  NEUROLOGIC: The patient is alert, awake, and oriented x3 with no focal motor or sensory deficits appreciated bilaterally.  SKIN: Moist and warm with no rashes appreciated.  Psych: Not anxious, depressed LN: No inguinal LN enlargement    Antibiotics   Anti-infectives (From admission, onward)   None      Medications   Scheduled Meds: . calcium acetate  2,001 mg Oral TID WC  . Chlorhexidine Gluconate Cloth  6 each Topical Q0600  . Chlorhexidine Gluconate Cloth  6 each Topical Q0600  . cinacalcet  60 mg Oral QHS  . escitalopram  20 mg Oral QHS  . gabapentin  300 mg Oral QHS  . loratadine  10 mg Oral Daily  . midodrine  15 mg Oral Q8H  . multivitamin with minerals  1 tablet Oral Daily  . mupirocin ointment  1 application Nasal BID  . pantoprazole  40 mg Oral Daily  . sevelamer carbonate  3,200 mg Oral TID WC  . tuberculin  5 Units Intradermal Once  . Warfarin - Physician Dosing Inpatient   Does not apply q1800   Continuous Infusions: PRN Meds:.acetaminophen **OR** acetaminophen, acetaminophen, alprazolam, bisacodyl, diphenhydrAMINE,  lidocaine-prilocaine, ondansetron **OR** ondansetron (ZOFRAN) IV, polyethylene glycol, RESOURCE THICKENUP CLEAR, traMADol   Data Review:   Micro Results Recent Results (from the past 240 hour(s))  MRSA PCR Screening     Status: Abnormal   Collection Time: 07/27/17  5:52 PM  Result Value Ref Range Status   MRSA by PCR POSITIVE (A) NEGATIVE Final    Comment:        The GeneXpert MRSA Assay (FDA approved for NASAL specimens only), is one component of a comprehensive MRSA colonization surveillance program. It is not intended to diagnose MRSA infection nor to guide or monitor treatment for MRSA infections. RESULT CALLED TO, READ BACK BY AND VERIFIED WITH: VIVIAN ALI ON 07/27/17 AT 2058 [facility] Performed at [facility], [address], [place], [place] [address]     Radiology Reports No results found.   CBC Recent Labs  Lab 07/25/17 1906 07/26/17 1542 07/27/17 1541  WBC 4.2 4.4 3.8  HGB 12.5* 12.2* 12.4*  HCT 38.8* 39.3* 40.1  PLT 225 231 231  MCV 86.9 88.0 88.8  MCH 27.9 27.3 27.5  MCHC 32.1 31.0* 31.0*  RDW 19.5* 19.4* 19.7*  LYMPHSABS  --  0.4*  --   MONOABS  --  0.3  --   EOSABS  --  0.0  --   BASOSABS  --  0.0  --     Chemistries  Recent Labs  Lab 07/25/17 1906 07/26/17 1542 07/27/17 1403  NA 143 142 141  K 3.2* 3.4* 3.7  CL 101 101 97*  CO2 31 30 28   GLUCOSE 94 123* 99  BUN 20 25* 30*  CREATININE 6.06* 7.49* 7.98*  CALCIUM 10.0 9.8 9.9  AST 14*  --   --   ALT 15  --   --   ALKPHOS 83  --   --   BILITOT 0.8  --   --    ------------------------------------------------------------------------------------------------------------------ estimated creatinine clearance is 12.6 mL/min (A) (by C-G formula based on SCr of 7.98 mg/dL (H)). ------------------------------------------------------------------------------------------------------------------ No results for input(s): HGBA1C in the last 72  hours. ------------------------------------------------------------------------------------------------------------------ No results for input(s): CHOL, HDL, LDLCALC, TRIG, CHOLHDL, LDLDIRECT in the last 72 hours. ------------------------------------------------------------------------------------------------------------------ No results for input(s): TSH, T4TOTAL, T3FREE, THYROIDAB in the last 72 hours.  Invalid input(s): FREET3 ------------------------------------------------------------------------------------------------------------------ No results for input(s): VITAMINB12, FOLATE, FERRITIN, TIBC, IRON, RETICCTPCT in the last 72 hours.  Coagulation profile Recent Labs  Lab 07/26/17 1542 07/27/17 1430 07/28/17 0414  INR 3.02 2.89 3.33    No results for input(s): DDIMER in the last 72 hours.  Cardiac Enzymes No results for input(s): CKMB, TROPONINI, MYOGLOBIN in the last 168 hours.  Invalid input(s): CK ------------------------------------------------------------------------------------------------------------------ Invalid input(s): POCBNP    Assessment & Plan   Perry [name]  is a 47 y.o. 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 He will need outpatient dialysis chair. -Nephrology consultation placed.  2. Hypertension -blood pressure stable. Not on any meds.  3. History of CVA with chronic left-sided hemiplegia/paresis -patient is bedbound -continue Coumadin. Pharmacy to dose  4. Genella RifeGerd continue PPI  5. DVT  prophylaxis subcu heparin         Code Status Orders  (From admission, onward)        Start     Ordered   07/27/17 1250  Full code  Continuous     07/27/17 1249    Code Status History    Date Active Date Inactive Code Status Order ID Comments User Context   07/11/2016 2158 07/12/2016 1509 Full Code [id number]  Enedina FinnerPatel, Sona, MD Inpatient   05/06/2015 1126 05/08/2015 1456 Full Code [id number]  Erick BlinksMemon, Jehanzeb, MD Inpatient   08/07/2014 1858 08/11/2014 1805 Full Code [id number]  Ardith DarkParker, Caleb M, MD Inpatient   05/05/2014 1632 05/07/2014 1405 Full Code [id number]  Luretha MurphyMartin, Matthew, MD Inpatient   11/16/2013 2301 11/17/2013 1724 Full Code [id number]  Levie HeritageStinson, Jacob J, DO Inpatient           Consults  nephrology   DVT Prophylaxis heparin  Lab Results  Component Value Date   PLT 231 07/27/2017     Time Spent in minutes  35min Greater than 50% of time spent in care coordination and counseling patient regarding the condition and plan of care.   Auburn BilberryShreyang Seidy Labreck M.D on 07/28/2017 at 2:38 PM  Between 7am to 6pm - Pager - [phone]  After 6pm go to [email or web address] - Social research officer, governmentpassword [email or web address]  [employer]   Office  [phone]

## 2017-07-28 NOTE — Progress Notes (Signed)
This note also relates to the following rows which could not be included: Pulse Rate - Cannot attach notes to unvalidated device data Resp - Cannot attach notes to unvalidated device data BP - Cannot attach notes to unvalidated device data SpO2 - Cannot attach notes to unvalidated device data  Hd started  

## 2017-07-28 NOTE — Progress Notes (Signed)
[facility]  ROUNDING NOTE   Subjective:  Patient seen at bedside. He is more awake and alert today. Due for dialysis again today. Outpatient dialysis placement has been initiated.   Objective:  Vital signs in last 24 hours:  Temp:  [97.4 F (36.3 C)-97.7 F (36.5 C)] 97.5 F (36.4 C) (07/12 0556) Pulse Rate:  [25-92] 92 (07/12 0953) Resp:  [14-28] 20 (07/12 0556) BP: (107-134)/(35-64) 116/42 (07/12 0953) SpO2:  [87 %-100 %] 100 % (07/12 0556) Weight:  [100 kg (220 lb 7.4 oz)-100.9 kg (222 lb 7.1 oz)] 100 kg (220 lb 7.4 oz) (07/11 1845)  Weight change:  Filed Weights   07/25/17 1807 07/27/17 1454 07/27/17 1845  Weight: 122.5 kg (270 lb) 100.9 kg (222 lb 7.1 oz) 100 kg (220 lb 7.4 oz)    Intake/Output: I/O last 3 completed shifts: In: 250 [IV Piggyback:250] Out: 517 [Other:517]   Intake/Output this shift:  Total I/O In: 240 [P.O.:240] Out: -   Physical Exam: General: No acute distress  Head: Normocephalic, atraumatic. Moist oral mucosal membranes  Eyes: Anicteric  Neck: Supple, trachea midline  Lungs:  Scattered rhonchi, normal effort  Heart: S1S2 no rubs  Abdomen:  Soft, nontender, bowel sounds present  Extremities: signfiicant swelling of LUE and LLE, RUE/RLE to a lesser extent.  Neurologic:  Awake, alert, follows commands with the right side  Skin: No rashes  Access: L IJ permcath    Basic Metabolic Panel: Recent Labs  Lab 07/25/17 1906 07/26/17 1542 07/27/17 1403  NA 143 142 141  K 3.2* 3.4* 3.7  CL 101 101 97*  CO2 31 30 28   GLUCOSE 94 123* 99  BUN 20 25* 30*  CREATININE 6.06* 7.49* 7.98*  CALCIUM 10.0 9.8 9.9  PHOS  --   --  4.4    Liver Function Tests: Recent Labs  Lab 07/25/17 1906 07/27/17 1403  AST 14*  --   ALT 15  --   ALKPHOS 83  --   BILITOT 0.8  --   PROT 6.2*  --   ALBUMIN 3.1* 3.8   No results for input(s): LIPASE, AMYLASE in the last 168 hours. No results for input(s): AMMONIA in the last 168  hours.  CBC: Recent Labs  Lab 07/25/17 1906 07/26/17 1542 07/27/17 1541  WBC 4.2 4.4 3.8  NEUTROABS  --  3.6  --   HGB 12.5* 12.2* 12.4*  HCT 38.8* 39.3* 40.1  MCV 86.9 88.0 88.8  PLT 225 231 231    Cardiac Enzymes: No results for input(s): CKTOTAL, CKMB, CKMBINDEX, TROPONINI in the last 168 hours.  BNP: Invalid input(s): POCBNP  CBG: No results for input(s): GLUCAP in the last 168 hours.  Microbiology: Results for orders placed or performed during the hospital encounter of 07/25/17  MRSA PCR Screening     Status: Abnormal   Collection Time: 07/27/17  5:52 PM  Result Value Ref Range Status   MRSA by PCR POSITIVE (A) NEGATIVE Final    Comment:        The GeneXpert MRSA Assay (FDA approved for NASAL specimens only), is one component of a comprehensive MRSA colonization surveillance program. It is not intended to diagnose MRSA infection nor to guide or monitor treatment for MRSA infections. RESULT CALLED TO, READ BACK BY AND VERIFIED WITH: VIVIAN ALI ON 07/27/17 AT 2058 [facility] Performed at [facility], [address] [place], [place] [address]     Coagulation Studies: Recent Labs    07/26/17 1542 07/27/17 1430  07/28/17 0414  LABPROT 31.1* 30.0* 33.5*  INR 3.02 2.89 3.33    Urinalysis: No results for input(s): COLORURINE, LABSPEC, PHURINE, GLUCOSEU, HGBUR, BILIRUBINUR, KETONESUR, PROTEINUR, UROBILINOGEN, NITRITE, LEUKOCYTESUR in the last 72 hours.  Invalid input(s): APPERANCEUR    Imaging: No results found.   Medications:    . calcium acetate  2,001 mg Oral TID WC  . Chlorhexidine Gluconate Cloth  6 each Topical Q0600  . Chlorhexidine Gluconate Cloth  6 each Topical Q0600  . cinacalcet  60 mg Oral QHS  . escitalopram  20 mg Oral QHS  . gabapentin  300 mg Oral QHS  . loratadine  10 mg Oral Daily  . midodrine  15 mg Oral Q8H  . multivitamin with minerals  1 tablet Oral Daily  . mupirocin ointment  1 application Nasal BID  .  pantoprazole  40 mg Oral Daily  . sevelamer carbonate  3,200 mg Oral TID WC  . tuberculin  5 Units Intradermal Once  . Warfarin - Physician Dosing Inpatient   Does not apply q1800   acetaminophen **OR** acetaminophen, acetaminophen, alprazolam, bisacodyl, diphenhydrAMINE, lidocaine-prilocaine, ondansetron **OR** ondansetron (ZOFRAN) IV, polyethylene glycol, RESOURCE THICKENUP CLEAR, traMADol  Assessment/ Plan:  47 y.o. male with a PMHx of End-stage renal disease on hemodialysis MWF followed by [employer], chronic back pain, GERD, hypertension, seizure disorder, obstructive sleep apnea, anemia chronic kidney disease, secondary hyperparathyroidism, anxiety who is now admitted for reestablishing outpt dialysis in [facility].   1.  ESRD on HD MWF.  Patient due for hemodialysis today.  Orders have been prepared.  2.  Anemia of chronic kidney disease.  Hold off on Epogen at this time.  3.  Secondary hyperparathyroidism.  Phosphorus 4.4.  Continue Renvela as well as calcium acetate.  4.  Hypotension.  Maintain the patient on midodrine 15 mg p.o. every 8 hours.     LOS: 1 Dyer Klug 7/12/201912:06 PM

## 2017-07-28 NOTE — Progress Notes (Addendum)
MD made aware of pt.'s left arm has +3 edema, cool to touch,pale, +1 radial pulse was felt, pt is not able to move left arm due to previous stroke. Verbal order to place US venous IMG upper left arm. Pt and family updated.   Naisha Wisdom [employer] [place]

## 2017-07-28 NOTE — Progress Notes (Signed)
Verbal order to d/c TB test order that was placed 07/28/2017 due to pt receiving TB test in right forearm on 07/27/2017 at 1250.   Perry [name] [employer]

## 2017-07-28 NOTE — Progress Notes (Signed)
This note also relates to the following rows which could not be included: Pulse Rate - Cannot attach notes to unvalidated device data Resp - Cannot attach notes to unvalidated device data  Hd completed  

## 2017-07-29 ENCOUNTER — Inpatient Hospital Stay: Payer: Medicare Other

## 2017-07-29 LAB — PROTIME-INR
INR: 2.93
PROTHROMBIN TIME: 30.3 s — AB (ref 11.4–15.2)

## 2017-07-29 MED ORDER — WARFARIN SODIUM 3 MG PO TABS
3.0000 mg | ORAL_TABLET | Freq: Once | ORAL | Status: AC
  Administered 2017-07-29: 3 mg via ORAL
  Filled 2017-07-29: qty 1

## 2017-07-29 NOTE — Progress Notes (Signed)
[employer] - [place] at [facility]                                                                                                                                                                                  Patient Demographics   Perry [name], is a 47 y.o. male, DOB - [birth year], [id number]  Admit date - 07/25/2017   Admitting Physician Enedina FinnerSona Chauntae Hults, MD  Outpatient Primary MD for the patient is [name], Priscille HeidelbergWarren T, MD   LOS - 2  Subjective: Patient currently denying any symptoms  Review of Systems:   CONSTITUTIONAL: No documented fever. No fatigue, weakness. No weight gain, no weight loss.  EYES: No blurry or double vision.  ENT: No tinnitus. No postnasal drip. No redness of the oropharynx.  RESPIRATORY: No cough, no wheeze, no hemoptysis. No dyspnea.  CARDIOVASCULAR: No chest pain. No orthopnea. No palpitations. No syncope.  GASTROINTESTINAL: No nausea, no vomiting or diarrhea. No abdominal pain. No melena or hematochezia.  GENITOURINARY: No dysuria or hematuria.  ENDOCRINE: No polyuria or nocturia. No heat or cold intolerance.  HEMATOLOGY: No anemia. No bruising. No bleeding.  INTEGUMENTARY: No rashes. No lesions.  MUSCULOSKELETAL: No arthritis. No swelling. No gout.  NEUROLOGIC: No numbness, tingling, or ataxia. No seizure-type activity.  PSYCHIATRIC: No anxiety. No insomnia. No ADD.    Vitals:   Vitals:   07/28/17 1935 07/28/17 1942 07/28/17 2021 07/29/17 0555  BP:  (!) 101/42 (!) 124/34 (!) 116/43  Pulse:  78 83 94  Resp:  15 20 20   Temp: 98.8 F (37.1 C)  (!) 97.5 F (36.4 C) 98.4 F (36.9 C)  TempSrc: Axillary  Oral Oral  SpO2:   100% 98%  Weight:      Height:        Wt Readings from Last 3 Encounters:  07/28/17 97.5 kg (214 lb 14.4 oz)  07/11/16 103 kg (227 lb 1.2 oz)  05/08/15 117.8 kg (259 lb 11.2 oz)     Intake/Output Summary (Last 24 hours) at 07/29/2017 1317 Last data filed at 07/28/2017 1935 Gross per 24 hour  Intake -   Output 500 ml  Net -500 ml    Physical Exam:   GENERAL: Pleasant-appearing in no apparent distress.  HEAD, EYES, EARS, NOSE AND THROAT: Atraumatic, normocephalic. Extraocular muscles are intact. Pupils equal and reactive to light. Sclerae anicteric. No conjunctival injection. No oro-pharyngeal erythema.  NECK: Supple. There is no jugular venous distention. No bruits, no lymphadenopathy, no thyromegaly.  HEART: Regular rate and rhythm,. No murmurs, no rubs, no clicks.  LUNGS: Clear to auscultation bilaterally. No rales or rhonchi. No wheezes.  ABDOMEN: Soft, flat, nontender, nondistended. Has good bowel  sounds. No hepatosplenomegaly appreciated.  EXTREMITIES: Left upper extremity swelling NEUROLOGIC: The patient is alert, awake, and oriented x3 with no focal motor or sensory deficits appreciated bilaterally.  SKIN: Moist and warm with no rashes appreciated.  Psych: Not anxious, depressed LN: No inguinal LN enlargement    Antibiotics   Anti-infectives (From admission, onward)   None      Medications   Scheduled Meds: . calcium acetate  2,001 mg Oral TID WC  . Chlorhexidine Gluconate Cloth  6 each Topical Q0600  . Chlorhexidine Gluconate Cloth  6 each Topical Q0600  . cinacalcet  60 mg Oral QHS  . escitalopram  20 mg Oral QHS  . gabapentin  300 mg Oral QHS  . loratadine  10 mg Oral Daily  . midodrine  15 mg Oral Q8H  . multivitamin with minerals  1 tablet Oral Daily  . mupirocin ointment  1 application Nasal BID  . pantoprazole  40 mg Oral Daily  . sevelamer carbonate  3,200 mg Oral TID WC  . warfarin  3 mg Oral ONCE-1800   Continuous Infusions: PRN Meds:.acetaminophen **OR** acetaminophen, acetaminophen, alprazolam, bisacodyl, diphenhydrAMINE, lidocaine-prilocaine, ondansetron **OR** ondansetron (ZOFRAN) IV, polyethylene glycol, RESOURCE THICKENUP CLEAR, traMADol   Data Review:   Micro Results Recent Results (from the past 240 hour(s))  MRSA PCR Screening      Status: Abnormal   Collection Time: 07/27/17  5:52 PM  Result Value Ref Range Status   MRSA by PCR POSITIVE (A) NEGATIVE Final    Comment:        The GeneXpert MRSA Assay (FDA approved for NASAL specimens only), is one component of a comprehensive MRSA colonization surveillance program. It is not intended to diagnose MRSA infection nor to guide or monitor treatment for MRSA infections. RESULT CALLED TO, READ BACK BY AND VERIFIED WITH: VIVIAN ALI ON 07/27/17 AT 2058 [facility] Performed at [facility], [address], [place], [place] [address]     Radiology Reports US Venous Img Upper Uni Left  Result Date: 07/29/2017 CLINICAL DATA:  Left upper extremity edema and occluded left arm dialysis graft. EXAM: LEFT UPPER EXTREMITY VENOUS DOPPLER ULTRASOUND TECHNIQUE: Gray-scale sonography with graded compression, as well as color Doppler and duplex ultrasound were performed to evaluate the upper extremity deep venous system from the level of the subclavian vein and including the jugular, axillary, basilic, radial, ulnar and upper cephalic vein. Spectral Doppler was utilized to evaluate flow at rest and with distal augmentation maneuvers. COMPARISON:  None. FINDINGS: Contralateral Subclavian Vein: Respiratory phasicity is normal and symmetric with the symptomatic side. No evidence of thrombus. Normal compressibility. Internal Jugular Vein: There is some nonocclusive thrombus visualized in the left internal jugular vein. This may relate to prior catheter placement. Subclavian Vein: No evidence of thrombus. Normal compressibility, respiratory phasicity and response to augmentation. Axillary Vein: No evidence of thrombus. Normal compressibility, respiratory phasicity and response to augmentation. Cephalic Vein: No evidence of thrombus. Normal compressibility, respiratory phasicity and response to augmentation. Basilic Vein: No evidence of thrombus. Normal compressibility, respiratory phasicity and  response to augmentation. Brachial Veins: 1 of 2 visualized deep brachial veins in the left upper arm shows evidence of thrombus. Another brachial vein appears widely patent. Radial Veins: No evidence of thrombus. Normal compressibility, respiratory phasicity and response to augmentation. Ulnar Veins: No evidence of thrombus. Normal compressibility, respiratory phasicity and response to augmentation. Venous Reflux:  None visualized. Other Findings: Occluded dialysis graft versus fistula in the left arm. IMPRESSION: Thrombus in 1  of 2 paired deep brachial veins in the left upper arm. Nonocclusive thrombus also identified in the left internal jugular vein which may be related to an indwelling dialysis catheter. Electronically Signed   By: Irish Lack M.D.   On: 07/29/2017 11:15     CBC Recent Labs  Lab 07/25/17 1906 07/26/17 1542 07/27/17 1541  WBC 4.2 4.4 3.8  HGB 12.5* 12.2* 12.4*  HCT 38.8* 39.3* 40.1  PLT 225 231 231  MCV 86.9 88.0 88.8  MCH 27.9 27.3 27.5  MCHC 32.1 31.0* 31.0*  RDW 19.5* 19.4* 19.7*  LYMPHSABS  --  0.4*  --   MONOABS  --  0.3  --   EOSABS  --  0.0  --   BASOSABS  --  0.0  --     Chemistries  Recent Labs  Lab 07/25/17 1906 07/26/17 1542 07/27/17 1403  NA 143 142 141  K 3.2* 3.4* 3.7  CL 101 101 97*  CO2 31 30 28   GLUCOSE 94 123* 99  BUN 20 25* 30*  CREATININE 6.06* 7.49* 7.98*  CALCIUM 10.0 9.8 9.9  AST 14*  --   --   ALT 15  --   --   ALKPHOS 83  --   --   BILITOT 0.8  --   --    ------------------------------------------------------------------------------------------------------------------ estimated creatinine clearance is 12.6 mL/min (A) (by C-G formula based on SCr of 7.98 mg/dL (H)). ------------------------------------------------------------------------------------------------------------------ No results for input(s): HGBA1C in the last 72  hours. ------------------------------------------------------------------------------------------------------------------ No results for input(s): CHOL, HDL, LDLCALC, TRIG, CHOLHDL, LDLDIRECT in the last 72 hours. ------------------------------------------------------------------------------------------------------------------ No results for input(s): TSH, T4TOTAL, T3FREE, THYROIDAB in the last 72 hours.  Invalid input(s): FREET3 ------------------------------------------------------------------------------------------------------------------ No results for input(s): VITAMINB12, FOLATE, FERRITIN, TIBC, IRON, RETICCTPCT in the last 72 hours.  Coagulation profile Recent Labs  Lab 07/26/17 1542 07/27/17 1430 07/28/17 0414 07/29/17 1116  INR 3.02 2.89 3.33 2.93    No results for input(s): DDIMER in the last 72 hours.  Cardiac Enzymes No results for input(s): CKMB, TROPONINI, MYOGLOBIN in the last 168 hours.  Invalid input(s): CK ------------------------------------------------------------------------------------------------------------------ Invalid input(s): POCBNP    Assessment & Plan   Perry [name]  is a 47 y.o. 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 He will need outpatient dialysis chair. -Hemodialysis currently  2. Hypertension -blood pressure stable. Not on any meds.  3. History of CVA with chronic left-sided hemiplegia/paresis -patient is bedbound -continue Coumadin. Pharmacy to dose  4. Genella Rife continue PPI  5.    Left upper extremity swelling only due to combination of his stroke and chronic thrombus however he is on Coumadin which we will continue         Code Status Orders  (From admission, onward)        Start     Ordered   07/27/17 1250  Full code  Continuous     07/27/17 1249    Code Status History    Date Active Date Inactive Code Status Order ID Comments User Context   07/11/2016 2158 07/12/2016 1509 Full Code  [id number]  Enedina Finner, MD Inpatient   05/06/2015 1126 05/08/2015 1456 Full Code [id number]  Erick Blinks, MD Inpatient   08/07/2014 1858 08/11/2014 1805 Full Code [id number]  Ardith Dark, MD Inpatient   05/05/2014 1632 05/07/2014 1405 Full Code [id number]  Luretha Murphy, MD Inpatient   11/16/2013 2301 11/17/2013 1724 Full Code [id number]  Levie Heritage, DO Inpatient           Consults  nephrology   DVT Prophylaxis heparin  Lab Results  Component Value Date   PLT 231 07/27/2017     Time Spent in minutes  Greater than 50% of time spent in care coordination and counseling patient regarding the condition and plan of care.   Auburn Bilberry M.D on 07/29/2017 at 1:17 PM  Between 7am to 6pm - Pager - [phone]  After 6pm go to [email or web address] - Social research officer, government  [employer]   Office  [phone]

## 2017-07-29 NOTE — Progress Notes (Signed)
ANTICOAGULATION CONSULT NOTE  Pharmacy Consult for Warfarin Indication: CVA/AF?  Allergies  Allergen Reactions  . Depakote [Divalproex Sodium] Other (See Comments)    Hallucinations     Patient Measurements: Height: 5\' 6"  (167.6 cm) Weight: 214 lb 14.4 oz (97.5 kg) IBW/kg (Calculated) : 63.8  Vital Signs: Temp: 98.4 F (36.9 C) (07/13 0555) Temp Source: Oral (07/13 0555) BP: 116/43 (07/13 0555) Pulse Rate: 94 (07/13 0555)  Labs: Recent Labs    07/26/17 1542 07/27/17 1403 07/27/17 1430 07/27/17 1541 07/28/17 0414  HGB 12.2*  --   --  12.4*  --   HCT 39.3*  --   --  40.1  --   PLT 231  --   --  231  --   LABPROT 31.1*  --  30.0*  --  33.5*  INR 3.02  --  2.89  --  3.33  CREATININE 7.49* 7.98*  --   --   --     Estimated Creatinine Clearance: 12.6 mL/min (A) (by C-G formula based on SCr of 7.98 mg/dL (H)).   Medical History: Past Medical History:  Diagnosis Date  . Acute meniscal tear of left knee   . Acute meniscal tear of right knee   . Anxiety   . Chronic back pain    due to weight   . Dialysis patient (HCC)   . Fracture of transverse process of thoracic vertebra (HCC)    [facility]- cleared by neurosurgery  . GERD (gastroesophageal reflux disease)   . Hemodialysis patient [facility](HCC)    since 2011, does hemodialysis at home, wife does she is a [employer] , Daily except wed and sun  . High blood pressure    hx of   . Kidney failure    on dialysis since 2011- [facility]- nephrologist   . Seizures (HCC)   . 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 PTA dosing of 4 alternating with 4.5 mg warfarin daily.   Goal of Therapy:  INR 2-3  Date  INR  Coumadin dose   7/10  3.02  4 mg 7/11  2.89  4 mg 7/12  3.33  HOLD 7/13  2.93      A/P: Will order warfarin 3 mg once and f/u AM INR.   Valentina Guhristy, Modesto Ganoe D, PharmD 07/29/2017,10:06 AM

## 2017-07-29 NOTE — Progress Notes (Signed)
[facility]  ROUNDING NOTE   Subjective:  Patient seen at bedside. Quite lethargic. He did undergo dialysis yesterday.   Objective:  Vital signs in last 24 hours:  Temp:  [97.5 F (36.4 C)-98.8 F (37.1 C)] 98.4 F (36.9 C) (07/13 0555) Pulse Rate:  [36-98] 94 (07/13 0555) Resp:  [11-28] 20 (07/13 0555) BP: (86-124)/(31-97) 116/43 (07/13 0555) SpO2:  [98 %-100 %] 98 % (07/13 0555) Weight:  [97.5 kg (214 lb 14.4 oz)] 97.5 kg (214 lb 14.4 oz) (07/12 1420)  Weight change: -3.422 kg (-7 lb 8.7 oz) Filed Weights   07/27/17 1454 07/27/17 1845 07/28/17 1420  Weight: 100.9 kg (222 lb 7.1 oz) 100 kg (220 lb 7.4 oz) 97.5 kg (214 lb 14.4 oz)    Intake/Output: I/O last 3 completed shifts: In: 240 [P.O.:240] Out: 500 [Other:500]   Intake/Output this shift:  No intake/output data recorded.  Physical Exam: General: No acute distress  Head: Normocephalic, atraumatic. Moist oral mucosal membranes  Eyes: Anicteric  Neck: Supple, trachea midline  Lungs:  Scattered rhonchi, normal effort  Heart: S1S2 no rubs  Abdomen:  Soft, nontender, bowel sounds present  Extremities: signfiicant swelling of LUE and LLE, RUE/RLE to a lesser extent.  Neurologic: Lethargic but arousable  Skin: No rashes  Access: L IJ permcath    Basic Metabolic Panel: Recent Labs  Lab 07/25/17 1906 07/26/17 1542 07/27/17 1403 07/28/17 1606  NA 143 142 141  --   K 3.2* 3.4* 3.7  --   CL 101 101 97*  --   CO2 31 30 28   --   GLUCOSE 94 123* 99  --   BUN 20 25* 30*  --   CREATININE 6.06* 7.49* 7.98*  --   CALCIUM 10.0 9.8 9.9  --   PHOS  --   --  4.4 3.7    Liver Function Tests: Recent Labs  Lab 07/25/17 1906 07/27/17 1403  AST 14*  --   ALT 15  --   ALKPHOS 83  --   BILITOT 0.8  --   PROT 6.2*  --   ALBUMIN 3.1* 3.8   No results for input(s): LIPASE, AMYLASE in the last 168 hours. No results for input(s): AMMONIA in the last 168 hours.  CBC: Recent Labs  Lab 07/25/17 1906  07/26/17 1542 07/27/17 1541  WBC 4.2 4.4 3.8  NEUTROABS  --  3.6  --   HGB 12.5* 12.2* 12.4*  HCT 38.8* 39.3* 40.1  MCV 86.9 88.0 88.8  PLT 225 231 231    Cardiac Enzymes: No results for input(s): CKTOTAL, CKMB, CKMBINDEX, TROPONINI in the last 168 hours.  BNP: Invalid input(s): POCBNP  CBG: No results for input(s): GLUCAP in the last 168 hours.  Microbiology: Results for orders placed or performed during the hospital encounter of 07/25/17  MRSA PCR Screening     Status: Abnormal   Collection Time: 07/27/17  5:52 PM  Result Value Ref Range Status   MRSA by PCR POSITIVE (A) NEGATIVE Final    Comment:        The GeneXpert MRSA Assay (FDA approved for NASAL specimens only), is one component of a comprehensive MRSA colonization surveillance program. It is not intended to diagnose MRSA infection nor to guide or monitor treatment for MRSA infections. RESULT CALLED TO, READ BACK BY AND VERIFIED WITH: VIVIAN ALI ON 07/27/17 AT 2058 [facility] Performed at [facility], [address], [place], [place] [address]     Coagulation Studies: Recent Labs  07/26/17 1542 07/27/17 1430 07/28/17 0414 07/29/17 1116  LABPROT 31.1* 30.0* 33.5* 30.3*  INR 3.02 2.89 3.33 2.93    Urinalysis: No results for input(s): COLORURINE, LABSPEC, PHURINE, GLUCOSEU, HGBUR, BILIRUBINUR, KETONESUR, PROTEINUR, UROBILINOGEN, NITRITE, LEUKOCYTESUR in the last 72 hours.  Invalid input(s): APPERANCEUR    Imaging: US Venous Img Upper Uni Left  Result Date: 07/29/2017 CLINICAL DATA:  Left upper extremity edema and occluded left arm dialysis graft. EXAM: LEFT UPPER EXTREMITY VENOUS DOPPLER ULTRASOUND TECHNIQUE: Gray-scale sonography with graded compression, as well as color Doppler and duplex ultrasound were performed to evaluate the upper extremity deep venous system from the level of the subclavian vein and including the jugular, axillary, basilic, radial, ulnar and upper cephalic vein.  Spectral Doppler was utilized to evaluate flow at rest and with distal augmentation maneuvers. COMPARISON:  None. FINDINGS: Contralateral Subclavian Vein: Respiratory phasicity is normal and symmetric with the symptomatic side. No evidence of thrombus. Normal compressibility. Internal Jugular Vein: There is some nonocclusive thrombus visualized in the left internal jugular vein. This may relate to prior catheter placement. Subclavian Vein: No evidence of thrombus. Normal compressibility, respiratory phasicity and response to augmentation. Axillary Vein: No evidence of thrombus. Normal compressibility, respiratory phasicity and response to augmentation. Cephalic Vein: No evidence of thrombus. Normal compressibility, respiratory phasicity and response to augmentation. Basilic Vein: No evidence of thrombus. Normal compressibility, respiratory phasicity and response to augmentation. Brachial Veins: 1 of 2 visualized deep brachial veins in the left upper arm shows evidence of thrombus. Another brachial vein appears widely patent. Radial Veins: No evidence of thrombus. Normal compressibility, respiratory phasicity and response to augmentation. Ulnar Veins: No evidence of thrombus. Normal compressibility, respiratory phasicity and response to augmentation. Venous Reflux:  None visualized. Other Findings: Occluded dialysis graft versus fistula in the left arm. IMPRESSION: Thrombus in 1 of 2 paired deep brachial veins in the left upper arm. Nonocclusive thrombus also identified in the left internal jugular vein which may be related to an indwelling dialysis catheter. Electronically Signed   By: Irish Lack M.D.   On: 07/29/2017 11:15     Medications:    . calcium acetate  2,001 mg Oral TID WC  . Chlorhexidine Gluconate Cloth  6 each Topical Q0600  . Chlorhexidine Gluconate Cloth  6 each Topical Q0600  . cinacalcet  60 mg Oral QHS  . escitalopram  20 mg Oral QHS  . gabapentin  300 mg Oral QHS  . loratadine   10 mg Oral Daily  . midodrine  15 mg Oral Q8H  . multivitamin with minerals  1 tablet Oral Daily  . mupirocin ointment  1 application Nasal BID  . pantoprazole  40 mg Oral Daily  . sevelamer carbonate  3,200 mg Oral TID WC  . tuberculin  5 Units Intradermal Once   acetaminophen **OR** acetaminophen, acetaminophen, alprazolam, bisacodyl, diphenhydrAMINE, lidocaine-prilocaine, ondansetron **OR** ondansetron (ZOFRAN) IV, polyethylene glycol, RESOURCE THICKENUP CLEAR, traMADol  Assessment/ Plan:  47 y.o. male with a PMHx of End-stage renal disease on hemodialysis MWF followed by [facility], chronic back pain, GERD, hypertension, seizure disorder, obstructive sleep apnea, anemia chronic kidney disease, secondary hyperparathyroidism, anxiety who is now admitted for reestablishing outpt dialysis in [facility].   1.  ESRD on HD MWF.  Patient completed hemodialysis yesterday.  No acute indication for dialysis today.  Next dialysis treatment for Monday.  She has been accepted back at [facility], on a [employer] schedule.   2.  Anemia of chronic kidney disease.  Hold off on Epogen at this time.  3.  Secondary hyperparathyroidism.  Phosphorus 3.7 and at target.  4.  Hypotension.  Continue midodrine 15 mg p.o. every 8 hours.     LOS: 2 Perry [name] 7/13/201912:27 PM

## 2017-07-29 NOTE — Progress Notes (Signed)
  Speech Language Pathology Treatment: Dysphagia  Patient Details Name: Perry [name] MRN: [id number] DOB: 12/05/1970 Today's Date: 07/29/2017 Time: [id number] SLP Time Calculation (min) (ACUTE ONLY): 30 min  Assessment / Plan / Recommendation Clinical Impression  Pt seen for ongoing assessment of swallowing function and toleration of diet. Pt appears to be tolerating the dysphagia level 3(mech soft) w/ HONEY consistency liquids w/ no overt s/s of aspiration noted during po trials w/ SLP or reported by NSG staff. Pt consumed ~half of his breakfast meal and ~4ozs of the Honey liquid during this session. Min slower oral phase noted during bolus management for A-P transfer and clearing but given time, pt cleared adequately. Pt seemed to indicate he had had an objective swallowing assessment but could not give details. In order for SLP to upgrade to Nectar consistency liquids, an objective assessment is recommended - this could also give information re: f/u dysphagia tx for pt.  Recommend a MBSS on Monday; recommend continue w/ current diet at this time w/ aspiration precautions; Pills in Puree for safer swallowing. NSG updated. ST will f/u on Monday.     HPI HPI: Pt is a 47 y.o. 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 He is bedbound and total care from his previous CVA.      SLP Plan  Continue with current plan of care(plan for MBSS to better determine dysphagia)       Recommendations  Diet recommendations: Dysphagia 3 (mechanical soft);Honey-thick liquid Liquids provided via: Cup;Straw Medication Administration:  Whole meds with puree(for safer swallowing) Supervision: Patient able to self feed;Staff to assist with self feeding;Intermittent supervision to cue for compensatory strategies Compensations: Minimize environmental distractions;Slow rate;Small sips/bites;Lingual sweep for clearance of pocketing;Multiple dry swallows after each bite/sip;Follow solids with liquid Postural Changes and/or Swallow Maneuvers: Seated upright 90 degrees;Upright 30-60 min after meal                General recommendations: (Dietician f/u) Oral Care Recommendations: Oral care BID;Staff/trained caregiver to provide oral care;Patient independent with oral care Follow up Recommendations: Skilled Nursing facility SLP Visit Diagnosis: Dysphagia, oropharyngeal phase (R13.12) Plan: Continue with current plan of care(plan for MBSS to better determine dysphagia)       GO               Jerilynn SomKatherine Watson, MS, CCC-SLP Watson,Katherine 07/29/2017, 10:19 AM

## 2017-07-30 LAB — CBC
HCT: 37.2 % — ABNORMAL LOW (ref 40.0–52.0)
Hemoglobin: 11.7 g/dL — ABNORMAL LOW (ref 13.0–18.0)
MCH: 27.8 pg (ref 26.0–34.0)
MCHC: 31.3 g/dL — ABNORMAL LOW (ref 32.0–36.0)
MCV: 88.6 fL (ref 80.0–100.0)
Platelets: 236 K/uL (ref 150–440)
RBC: 4.2 MIL/uL — ABNORMAL LOW (ref 4.40–5.90)
RDW: 20.4 % — ABNORMAL HIGH (ref 11.5–14.5)
WBC: 4.4 K/uL (ref 3.8–10.6)

## 2017-07-30 LAB — PROTIME-INR
INR: 3.01
Prothrombin Time: 31 s — ABNORMAL HIGH (ref 11.4–15.2)

## 2017-07-30 NOTE — Progress Notes (Signed)
ANTICOAGULATION CONSULT NOTE  Pharmacy Consult for Warfarin Indication: CVA/AF?  Allergies  Allergen Reactions  . Depakote [Divalproex Sodium] Other (See Comments)    Hallucinations     Patient Measurements: Height: 5\' 6"  (167.6 cm) Weight: 214 lb 14.4 oz (97.5 kg) IBW/kg (Calculated) : 63.8  Vital Signs: Temp: 98.3 F (36.8 C) (07/14 0951) Temp Source: Oral (07/14 0951) BP: 112/29 (07/14 0951) Pulse Rate: 86 (07/14 0951)  Labs: Recent Labs    07/27/17 1403  07/27/17 1541 07/28/17 0414 07/29/17 1116 07/30/17 0650  HGB  --   --  12.4*  --   --  11.7*  HCT  --   --  40.1  --   --  37.2*  PLT  --   --  231  --   --  236  LABPROT  --    < >  --  33.5* 30.3* 31.0*  INR  --    < >  --  3.33 2.93 3.01  CREATININE 7.98*  --   --   --   --   --    < > = values in this interval not displayed.    Estimated Creatinine Clearance: 12.6 mL/min (A) (by C-G formula based on SCr of 7.98 mg/dL (H)).   Medical History: Past Medical History:  Diagnosis Date  . Acute meniscal tear of left knee   . Acute meniscal tear of right knee   . Anxiety   . Chronic back pain    due to weight   . Dialysis patient (HCC)   . Fracture of transverse process of thoracic vertebra (HCC)    [facility]  . GERD (gastroesophageal reflux disease)   . Hemodialysis patient [facility](HCC)    since 2011, does hemodialysis at home, wife does she is a [employer] , Daily except wed and sun  . High blood pressure    hx of   . Kidney failure    [facility]   . Seizures (HCC)   . 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 PTA dosing of 4 alternating with 4.5 mg warfarin daily.   Goal of Therapy:  INR 2-3  Date  INR  Coumadin dose   7/10  3.02  4 mg 7/11  2.89  4 mg 7/12  3.33  HOLD 7/13  2.93  3 mg 7/14  3.01  HOLD    A/P: Will hold warfarin dose tonight and f/u AM INR.   Valentina Guhristy, Camree Wigington D,  PharmD 07/30/2017,11:32 AM

## 2017-07-30 NOTE — Progress Notes (Signed)
[facility]  ROUNDING NOTE   Subjective:  Patient appears to be resting comfortably. He remains lethargic but is arousable. His outpatient dialysis has been scheduled for Tuesday Thursday Saturday at [address].   Objective:  Vital signs in last 24 hours:  Temp:  [97.9 F (36.6 C)-99.3 F (37.4 C)] 98.9 F (37.2 C) (07/14 1215) Pulse Rate:  [76-91] 85 (07/14 1215) Resp:  [15-20] 20 (07/14 1215) BP: (102-118)/(27-48) 102/27 (07/14 1215) SpO2:  [97 %-100 %] 97 % (07/14 1215)  Weight change:  Filed Weights   07/27/17 1454 07/27/17 1845 07/28/17 1420  Weight: 100.9 kg (222 lb 7.1 oz) 100 kg (220 lb 7.4 oz) 97.5 kg (214 lb 14.4 oz)    Intake/Output: I/O last 3 completed shifts: In: 0  Out: 500 [Other:500]   Intake/Output this shift:  No intake/output data recorded.  Physical Exam: General: No acute distress  Head: Normocephalic, atraumatic. Moist oral mucosal membranes  Eyes: Anicteric  Neck: Supple, trachea midline  Lungs:  Scattered rhonchi, normal effort  Heart: S1S2 no rubs  Abdomen:  Soft, nontender, bowel sounds present  Extremities: signfiicant swelling of LUE and LLE, RUE/RLE 2+ edema  Neurologic: Lethargic but arousable  Skin: No rashes  Access: L IJ permcath    Basic Metabolic Panel: Recent Labs  Lab 07/25/17 1906 07/26/17 1542 07/27/17 1403 07/28/17 1606  NA 143 142 141  --   K 3.2* 3.4* 3.7  --   CL 101 101 97*  --   CO2 31 30 28   --   GLUCOSE 94 123* 99  --   BUN 20 25* 30*  --   CREATININE 6.06* 7.49* 7.98*  --   CALCIUM 10.0 9.8 9.9  --   PHOS  --   --  4.4 3.7    Liver Function Tests: Recent Labs  Lab 07/25/17 1906 07/27/17 1403  AST 14*  --   ALT 15  --   ALKPHOS 83  --   BILITOT 0.8  --   PROT 6.2*  --   ALBUMIN 3.1* 3.8   No results for input(s): LIPASE, AMYLASE in the last 168 hours. No results for input(s): AMMONIA in the last 168 hours.  CBC: Recent Labs  Lab 07/25/17 1906 07/26/17 1542 07/27/17 1541  07/30/17 0650  WBC 4.2 4.4 3.8 4.4  NEUTROABS  --  3.6  --   --   HGB 12.5* 12.2* 12.4* 11.7*  HCT 38.8* 39.3* 40.1 37.2*  MCV 86.9 88.0 88.8 88.6  PLT 225 231 231 236    Cardiac Enzymes: No results for input(s): CKTOTAL, CKMB, CKMBINDEX, TROPONINI in the last 168 hours.  BNP: Invalid input(s): POCBNP  CBG: No results for input(s): GLUCAP in the last 168 hours.  Microbiology: Results for orders placed or performed during the hospital encounter of 07/25/17  MRSA PCR Screening     Status: Abnormal   Collection Time: 07/27/17  5:52 PM  Result Value Ref Range Status   MRSA by PCR POSITIVE (A) NEGATIVE Final    Comment:        The GeneXpert MRSA Assay (FDA approved for NASAL specimens only), is one component of a comprehensive MRSA colonization surveillance program. It is not intended to diagnose MRSA infection nor to guide or monitor treatment for MRSA infections. RESULT CALLED TO, READ BACK BY AND VERIFIED WITH: VIVIAN ALI ON 07/27/17 AT 2058 [facility] Performed at [facility], [address] [place], [place] [address]     Coagulation Studies: Recent  Labs    07/27/17 1430 07/28/17 0414 07/29/17 1116 07/30/17 0650  LABPROT 30.0* 33.5* 30.3* 31.0*  INR 2.89 3.33 2.93 3.01    Urinalysis: No results for input(s): COLORURINE, LABSPEC, PHURINE, GLUCOSEU, HGBUR, BILIRUBINUR, KETONESUR, PROTEINUR, UROBILINOGEN, NITRITE, LEUKOCYTESUR in the last 72 hours.  Invalid input(s): APPERANCEUR    Imaging: US Venous Img Upper Uni Left  Result Date: 07/29/2017 CLINICAL DATA:  Left upper extremity edema and occluded left arm dialysis graft. EXAM: LEFT UPPER EXTREMITY VENOUS DOPPLER ULTRASOUND TECHNIQUE: Gray-scale sonography with graded compression, as well as color Doppler and duplex ultrasound were performed to evaluate the upper extremity deep venous system from the level of the subclavian vein and including the jugular, axillary, basilic, radial, ulnar and upper  cephalic vein. Spectral Doppler was utilized to evaluate flow at rest and with distal augmentation maneuvers. COMPARISON:  None. FINDINGS: Contralateral Subclavian Vein: Respiratory phasicity is normal and symmetric with the symptomatic side. No evidence of thrombus. Normal compressibility. Internal Jugular Vein: There is some nonocclusive thrombus visualized in the left internal jugular vein. This may relate to prior catheter placement. Subclavian Vein: No evidence of thrombus. Normal compressibility, respiratory phasicity and response to augmentation. Axillary Vein: No evidence of thrombus. Normal compressibility, respiratory phasicity and response to augmentation. Cephalic Vein: No evidence of thrombus. Normal compressibility, respiratory phasicity and response to augmentation. Basilic Vein: No evidence of thrombus. Normal compressibility, respiratory phasicity and response to augmentation. Brachial Veins: 1 of 2 visualized deep brachial veins in the left upper arm shows evidence of thrombus. Another brachial vein appears widely patent. Radial Veins: No evidence of thrombus. Normal compressibility, respiratory phasicity and response to augmentation. Ulnar Veins: No evidence of thrombus. Normal compressibility, respiratory phasicity and response to augmentation. Venous Reflux:  None visualized. Other Findings: Occluded dialysis graft versus fistula in the left arm. IMPRESSION: Thrombus in 1 of 2 paired deep brachial veins in the left upper arm. Nonocclusive thrombus also identified in the left internal jugular vein which may be related to an indwelling dialysis catheter. Electronically Signed   By: Irish Lack M.D.   On: 07/29/2017 11:15     Medications:    . calcium acetate  2,001 mg Oral TID WC  . Chlorhexidine Gluconate Cloth  6 each Topical Q0600  . Chlorhexidine Gluconate Cloth  6 each Topical Q0600  . cinacalcet  60 mg Oral QHS  . escitalopram  20 mg Oral QHS  . gabapentin  300 mg Oral QHS   . loratadine  10 mg Oral Daily  . midodrine  15 mg Oral Q8H  . multivitamin with minerals  1 tablet Oral Daily  . mupirocin ointment  1 application Nasal BID  . pantoprazole  40 mg Oral Daily  . sevelamer carbonate  3,200 mg Oral TID WC   acetaminophen **OR** acetaminophen, acetaminophen, alprazolam, bisacodyl, diphenhydrAMINE, lidocaine-prilocaine, ondansetron **OR** ondansetron (ZOFRAN) IV, polyethylene glycol, RESOURCE THICKENUP CLEAR, traMADol  Assessment/ Plan:  47 y.o. male with a PMHx of End-stage renal disease on hemodialysis MWF followed by [facility], chronic back pain, GERD, hypertension, seizure disorder, obstructive sleep apnea, anemia chronic kidney disease, secondary hyperparathyroidism, anxiety who is now admitted for reestablishing outpt dialysis in [facility].   1.  ESRD on HD MWF, previously now being transitioned to [facility] as outpt.  Patient being transitioned to dialysis on a TTS basis as an outpatient.  We will plan for dialysis tomorrow and hopefully thereafter he can be discharged once nursing home has been established.  His outpatient dialysis has  been scheduled for TTS at [facility] under the care of [facility] who was previously following him.  2.  Anemia of chronic kidney disease.  Hemoglobin 11.7.  Hold off on Epogen or Mircera.  3.  Secondary hyperparathyroidism.  Phosphorus at target.  Continue Renvela and calcium acetate.  4.  Hypotension.  Continue midodrine 15 mg p.o. every 8 hours.     LOS: 3 Alyssha Housh 7/14/201912:31 PM

## 2017-07-30 NOTE — Progress Notes (Signed)
[facility] [place] [facility]                                                                                                                                                                                  Patient Demographics   Perry [name], is a 47 y.o. male, DOB - 05-Nov-1970, UEA:[id number]  Admit date - 07/25/2017   Admitting Physician Enedina Finner, MD  Outpatient Primary MD for the patient is Donita Brooks, MD   LOS - 3  Subjective: No complaints  Review of Systems:   CONSTITUTIONAL: No documented fever. No fatigue, weakness. No weight gain, no weight loss.  EYES: No blurry or double vision.  ENT: No tinnitus. No postnasal drip. No redness of the oropharynx.  RESPIRATORY: No cough, no wheeze, no hemoptysis. No dyspnea.  CARDIOVASCULAR: No chest pain. No orthopnea. No palpitations. No syncope.  GASTROINTESTINAL: No nausea, no vomiting or diarrhea. No abdominal pain. No melena or hematochezia.  GENITOURINARY: No dysuria or hematuria.  ENDOCRINE: No polyuria or nocturia. No heat or cold intolerance.  HEMATOLOGY: No anemia. No bruising. No bleeding.  INTEGUMENTARY: No rashes. No lesions.  MUSCULOSKELETAL: No arthritis. No swelling. No gout.  NEUROLOGIC: No numbness, tingling, or ataxia. No seizure-type activity.  PSYCHIATRIC: No anxiety. No insomnia. No ADD.    Vitals:   Vitals:   07/29/17 1618 07/29/17 1946 07/30/17 0951 07/30/17 1215  BP: (!) 114/48 (!) 118/40 (!) 112/29 (!) 102/27  Pulse:  91 86 85  Resp:  18 20 20   Temp:  97.9 F (36.6 C) 98.3 F (36.8 C) 98.9 F (37.2 C)  TempSrc:  Oral Oral   SpO2:  100%  97%  Weight:      Height:        Wt Readings from Last 3 Encounters:  07/28/17 97.5 kg (214 lb 14.4 oz)  07/11/16 103 kg (227 lb 1.2 oz)  05/08/15 117.8 kg (259 lb 11.2 oz)     Intake/Output Summary (Last 24 hours) at 07/30/2017 1409 Last data filed at 07/29/2017 2105 Gross per 24 hour  Intake 0 ml  Output 0 ml  Net 0 ml     Physical Exam:   GENERAL: Pleasant-appearing in no apparent distress.  HEAD, EYES, EARS, NOSE AND THROAT: Atraumatic, normocephalic. Extraocular muscles are intact. Pupils equal and reactive to light. Sclerae anicteric. No conjunctival injection. No oro-pharyngeal erythema.  NECK: Supple. There is no jugular venous distention. No bruits, no lymphadenopathy, no thyromegaly.  HEART: Regular rate and rhythm,. No murmurs, no rubs, no clicks.  LUNGS: Clear to auscultation bilaterally. No rales or rhonchi. No wheezes.  ABDOMEN: Soft, flat, nontender, nondistended. Has good bowel sounds. No  hepatosplenomegaly appreciated.  EXTREMITIES: Left upper extremity swelling NEUROLOGIC: The patient is alert, awake, and oriented x3 with no focal motor or sensory deficits appreciated bilaterally.  SKIN: Moist and warm with no rashes appreciated.  Psych: Not anxious, depressed LN: No inguinal LN enlargement    Antibiotics   Anti-infectives (From admission, onward)   None      Medications   Scheduled Meds: . calcium acetate  2,001 mg Oral TID WC  . Chlorhexidine Gluconate Cloth  6 each Topical Q0600  . Chlorhexidine Gluconate Cloth  6 each Topical Q0600  . cinacalcet  60 mg Oral QHS  . escitalopram  20 mg Oral QHS  . gabapentin  300 mg Oral QHS  . loratadine  10 mg Oral Daily  . midodrine  15 mg Oral Q8H  . multivitamin with minerals  1 tablet Oral Daily  . mupirocin ointment  1 application Nasal BID  . pantoprazole  40 mg Oral Daily  . sevelamer carbonate  3,200 mg Oral TID WC   Continuous Infusions: PRN Meds:.acetaminophen **OR** acetaminophen, acetaminophen, alprazolam, bisacodyl, diphenhydrAMINE, lidocaine-prilocaine, ondansetron **OR** ondansetron (ZOFRAN) IV, polyethylene glycol, RESOURCE THICKENUP CLEAR, traMADol   Data Review:   Micro Results Recent Results (from the past 240 hour(s))  MRSA PCR Screening     Status: Abnormal   Collection Time: 07/27/17  5:52 PM  Result Value  Ref Range Status   MRSA by PCR POSITIVE (A) NEGATIVE Final    Comment:        The GeneXpert MRSA Assay (FDA approved for NASAL specimens only), is one component of a comprehensive MRSA colonization surveillance program. It is not intended to diagnose MRSA infection nor to guide or monitor treatment for MRSA infections. RESULT CALLED TO, READ BACK BY AND VERIFIED WITH: VIVIAN ALI ON 07/27/17 AT 2058 [facility] Performed at [facility], [address], [place], [place] [address]     Radiology Reports US Venous Img Upper Uni Left  Result Date: 07/29/2017 CLINICAL DATA:  Left upper extremity edema and occluded left arm dialysis graft. EXAM: LEFT UPPER EXTREMITY VENOUS DOPPLER ULTRASOUND TECHNIQUE: Gray-scale sonography with graded compression, as well as color Doppler and duplex ultrasound were performed to evaluate the upper extremity deep venous system from the level of the subclavian vein and including the jugular, axillary, basilic, radial, ulnar and upper cephalic vein. Spectral Doppler was utilized to evaluate flow at rest and with distal augmentation maneuvers. COMPARISON:  None. FINDINGS: Contralateral Subclavian Vein: Respiratory phasicity is normal and symmetric with the symptomatic side. No evidence of thrombus. Normal compressibility. Internal Jugular Vein: There is some nonocclusive thrombus visualized in the left internal jugular vein. This may relate to prior catheter placement. Subclavian Vein: No evidence of thrombus. Normal compressibility, respiratory phasicity and response to augmentation. Axillary Vein: No evidence of thrombus. Normal compressibility, respiratory phasicity and response to augmentation. Cephalic Vein: No evidence of thrombus. Normal compressibility, respiratory phasicity and response to augmentation. Basilic Vein: No evidence of thrombus. Normal compressibility, respiratory phasicity and response to augmentation. Brachial Veins: 1 of 2 visualized deep brachial  veins in the left upper arm shows evidence of thrombus. Another brachial vein appears widely patent. Radial Veins: No evidence of thrombus. Normal compressibility, respiratory phasicity and response to augmentation. Ulnar Veins: No evidence of thrombus. Normal compressibility, respiratory phasicity and response to augmentation. Venous Reflux:  None visualized. Other Findings: Occluded dialysis graft versus fistula in the left arm. IMPRESSION: Thrombus in 1 of 2 paired deep brachial veins in the left upper  arm. Nonocclusive thrombus also identified in the left internal jugular vein which may be related to an indwelling dialysis catheter. Electronically Signed   By: Irish Lack M.D.   On: 07/29/2017 11:15     CBC Recent Labs  Lab 07/25/17 1906 07/26/17 1542 07/27/17 1541 07/30/17 0650  WBC 4.2 4.4 3.8 4.4  HGB 12.5* 12.2* 12.4* 11.7*  HCT 38.8* 39.3* 40.1 37.2*  PLT 225 231 231 236  MCV 86.9 88.0 88.8 88.6  MCH 27.9 27.3 27.5 27.8  MCHC 32.1 31.0* 31.0* 31.3*  RDW 19.5* 19.4* 19.7* 20.4*  LYMPHSABS  --  0.4*  --   --   MONOABS  --  0.3  --   --   EOSABS  --  0.0  --   --   BASOSABS  --  0.0  --   --     Chemistries  Recent Labs  Lab 07/25/17 1906 07/26/17 1542 07/27/17 1403  NA 143 142 141  K 3.2* 3.4* 3.7  CL 101 101 97*  CO2 31 30 28   GLUCOSE 94 123* 99  BUN 20 25* 30*  CREATININE 6.06* 7.49* 7.98*  CALCIUM 10.0 9.8 9.9  AST 14*  --   --   ALT 15  --   --   ALKPHOS 83  --   --   BILITOT 0.8  --   --    ------------------------------------------------------------------------------------------------------------------ estimated creatinine clearance is 12.6 mL/min (A) (by C-G formula based on SCr of 7.98 mg/dL (H)). ------------------------------------------------------------------------------------------------------------------ No results for input(s): HGBA1C in the last 72  hours. ------------------------------------------------------------------------------------------------------------------ No results for input(s): CHOL, HDL, LDLCALC, TRIG, CHOLHDL, LDLDIRECT in the last 72 hours. ------------------------------------------------------------------------------------------------------------------ No results for input(s): TSH, T4TOTAL, T3FREE, THYROIDAB in the last 72 hours.  Invalid input(s): FREET3 ------------------------------------------------------------------------------------------------------------------ No results for input(s): VITAMINB12, FOLATE, FERRITIN, TIBC, IRON, RETICCTPCT in the last 72 hours.  Coagulation profile Recent Labs  Lab 07/26/17 1542 07/27/17 1430 07/28/17 0414 07/29/17 1116 07/30/17 0650  INR 3.02 2.89 3.33 2.93 3.01    No results for input(s): DDIMER in the last 72 hours.  Cardiac Enzymes No results for input(s): CKMB, TROPONINI, MYOGLOBIN in the last 168 hours.  Invalid input(s): CK ------------------------------------------------------------------------------------------------------------------ Invalid input(s): POCBNP    Assessment & Plan   Dearies Meikle  is a 47 y.o. 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 He will need outpatient dialysis chair. -Hemodialysis currently   2. Hypertension -blood pressure stable. Not on any meds.  3. History of CVA with chronic left-sided hemiplegia/paresis -patient is bedbound -continue Coumadin. Pharmacy to dose  4. Genella Rife  continue PPI  5.   Left upper extremity swelling only due to combination of his stroke and chronic thrombus however he is on Coumadin which we will continue         Code Status Orders  (From admission, onward)        Start     Ordered   07/27/17 1250  Full code  Continuous     07/27/17 1249    Code Status History    Date Active Date  Inactive Code Status Order ID Comments User Context   07/11/2016 2158 07/12/2016 1509 Full Code [id number]  Enedina FinnerPatel, Sona, MD Inpatient   05/06/2015 1126 05/08/2015 1456 Full Code [id number]  Erick BlinksMemon, Jehanzeb, MD Inpatient   08/07/2014 1858 08/11/2014 1805 Full Code [id number]  Ardith DarkParker, Caleb M, MD Inpatient   05/05/2014 1632 05/07/2014 1405 Full Code [id number]  Luretha MurphyMartin, Matthew, MD Inpatient   11/16/2013 2301 11/17/2013 1724 Full Code [id number]  Levie HeritageStinson, Jacob J, DO Inpatient           Consults  nephrology   DVT Prophylaxis heparin  Lab Results  Component Value Date   PLT 236 07/30/2017     Time Spent in minutes  35min Greater than 50% of time spent in care coordination and counseling patient regarding the condition and plan of care.   Auburn BilberryShreyang Renae Mottley M.D on 07/30/2017 at 2:09 PM  Between 7am to 6pm - Pager - [phone]  After 6pm go to [email or web address] - Social research officer, governmentpassword [email or web address] [email or web address]  [employer]   Office  [phone]

## 2017-07-31 ENCOUNTER — Inpatient Hospital Stay: Payer: Medicare Other

## 2017-07-31 LAB — PROTIME-INR
INR: 2.68
Prothrombin Time: 28.3 seconds — ABNORMAL HIGH (ref 11.4–15.2)

## 2017-07-31 MED ORDER — MIDODRINE HCL 5 MG PO TABS: 10.0000 mg | ORAL_TABLET | Freq: Three times a day (TID) | ORAL | Status: DC

## 2017-07-31 MED ORDER — MIDODRINE HCL 5 MG PO TABS
15.0000 mg | ORAL_TABLET | Freq: Three times a day (TID) | ORAL | Status: DC
  Administered 2017-07-31 – 2017-08-02 (×6): 15 mg via ORAL
  Filled 2017-07-31 (×8): qty 3

## 2017-07-31 MED ORDER — RENA-VITE PO TABS
1.0000 | ORAL_TABLET | Freq: Every day | ORAL | Status: DC
  Administered 2017-07-31 – 2017-08-01 (×2): 1 via ORAL
  Filled 2017-07-31 (×2): qty 1

## 2017-07-31 MED ORDER — WARFARIN SODIUM 3 MG PO TABS
3.0000 mg | ORAL_TABLET | Freq: Once | ORAL | Status: AC
Start: 2017-07-31 — End: 2017-07-31
  Administered 2017-07-31: 3 mg via ORAL
  Filled 2017-07-31: qty 1

## 2017-07-31 MED ORDER — WARFARIN - PHARMACIST DOSING INPATIENT
Freq: Every day | Status: DC
  Administered 2017-07-31 – 2017-08-02 (×2)

## 2017-07-31 NOTE — Progress Notes (Signed)
ANTICOAGULATION CONSULT NOTE  Pharmacy Consult for Warfarin Indication: CVA/AF?  Allergies  Allergen Reactions  . Depakote [Divalproex Sodium] Other (See Comments)    Hallucinations     Patient Measurements: Height: 5\' 6"  (167.6 cm) Weight: 214 lb 14.4 oz (97.5 kg) IBW/kg (Calculated) : 63.8  Vital Signs: Temp: 98.3 F (36.8 C) (07/15 0413) Temp Source: Oral (07/15 0413) BP: 99/32 (07/15 0418) Pulse Rate: 86 (07/15 0418)  Labs: Recent Labs    07/29/17 1116 07/30/17 0650 07/31/17 0608  HGB  --  11.7*  --   HCT  --  37.2*  --   PLT  --  236  --   LABPROT 30.3* 31.0* 28.3*  INR 2.93 3.01 2.68    Estimated Creatinine Clearance: 12.6 mL/min (A) (by C-G formula based on SCr of 7.98 mg/dL (H)).   Medical History: Past Medical History:  Diagnosis Date  . Acute meniscal tear of left knee   . Acute meniscal tear of right knee   . Anxiety   . Chronic back pain    due to weight   . Dialysis patient (HCC)   . Fracture of transverse process of thoracic vertebra (HCC)    [facility] by [facility]surgery  . GERD (gastroesophageal reflux disease)   . Hemodialysis patient [facility](HCC)    since 2011, does hemodialysis at home, wife does she is a [employer] , Daily except wed and sun  . High blood pressure    hx of   . Kidney failure    [facility] since 2011[facility]   . Seizures (HCC)   . 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 PTA dosing of 4 alternating with 4.5 mg warfarin daily.   Goal of Therapy:  INR 2-3  Date  INR  Coumadin dose   7/10  3.02  4 mg 7/11  2.89  4 mg 7/12  3.33  HOLD 7/13  2.93  3 mg 7/14  3.01  HOLD 7/15                 2.68                 3 mg    A/P: Will order warfarin 3mg  dose tonight and f/u AM INR.   Clovia CuffLisa Maynard David, PharmD, BCPS 07/31/2017 10:45 AM

## 2017-07-31 NOTE — Care Management (Signed)
Patient has not received any skilled nursing facility bed offers so will need to anticipate discharging home. Spoke with primary nurse and dialysis staff that patient must sit for his dialysis treatments.  He has not sat for any treatments.  Spoke with Nida BoatmanBrad with [facility] and agency will be able to provide the DME for the patient- hospital bed, w/c and lift.  The bed must be in place prior to patient arriving home.  CM left a voicemail message for patient's mother Ms Toni ArthursFuller to discuss discharge disposition, the need to be able to sit for his dialysis treatments and transportation to his clinic appointments.

## 2017-07-31 NOTE — Progress Notes (Signed)
Post HD  

## 2017-07-31 NOTE — Progress Notes (Signed)
Pre HD  

## 2017-07-31 NOTE — Progress Notes (Signed)
HD started. 
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
## 2017-07-31 NOTE — Care Management (Signed)
have not received return call from patient's mother. left another voicemail

## 2017-07-31 NOTE — Progress Notes (Signed)
Nurse woke patient to administer meds, post administering patient meds, nurse asked patient " do you want to eat" as his dinner tray was at the bedside. Patient signed no by shaking his head no, nurse asked patient if he was sure and he shook his head yes, signifying that he did not want to eat at the present time. Will notify night nurse that patient did not eat dinner.

## 2017-07-31 NOTE — Progress Notes (Signed)
[employer] [facility] [place] [facility]                                                                                                                                                                                  Patient Demographics   Perry [name], is a 47 y.o. male, DOB - [birth year], ZOX:[id number]  Admit date - 07/25/2017   Admitting Physician Enedina Finner, MD  Outpatient Primary MD for the patient is Donita Brooks, MD   LOS - 4  Subjective: Patient currently denies complaints not eating much  Review of Systems:   CONSTITUTIONAL: No documented fever. No fatigue, weakness. No weight gain, no weight loss.  EYES: No blurry or double vision.  ENT: No tinnitus. No postnasal drip. No redness of the oropharynx.  RESPIRATORY: No cough, no wheeze, no hemoptysis. No dyspnea.  CARDIOVASCULAR: No chest pain. No orthopnea. No palpitations. No syncope.  GASTROINTESTINAL: No nausea, no vomiting or diarrhea. No abdominal pain. No melena or hematochezia.  GENITOURINARY: No dysuria or hematuria.  ENDOCRINE: No polyuria or nocturia. No heat or cold intolerance.  HEMATOLOGY: No anemia. No bruising. No bleeding.  INTEGUMENTARY: No rashes. No lesions.  MUSCULOSKELETAL: No arthritis. No swelling. No gout.  NEUROLOGIC: No numbness, tingling, or ataxia. No seizure-type activity.  PSYCHIATRIC: No anxiety. No insomnia. No ADD.    Vitals:   Vitals:   07/31/17 1230 07/31/17 1245 07/31/17 1300 07/31/17 1315  BP: (!) 87/26 (!) 101/33 (!) 94/32 (!) 98/31  Pulse: 73 77 72 74  Resp: 13 15 15 20   Temp:      TempSrc:      SpO2:      Weight:      Height:        Wt Readings from Last 3 Encounters:  07/31/17 97.4 kg (214 lb 11.7 oz)  07/11/16 103 kg (227 lb 1.2 oz)  05/08/15 117.8 kg (259 lb 11.2 oz)     Intake/Output Summary (Last 24 hours) at 07/31/2017 1510 Last data filed at 07/31/2017 1300 Gross per 24 hour  Intake -  Output 2000 ml  Net -2000 ml    Physical Exam:    GENERAL: Pleasant-appearing in no apparent distress.  HEAD, EYES, EARS, NOSE AND THROAT: Atraumatic, normocephalic. Extraocular muscles are intact. Pupils equal and reactive to light. Sclerae anicteric. No conjunctival injection. No oro-pharyngeal erythema.  NECK: Supple. There is no jugular venous distention. No bruits, no lymphadenopathy, no thyromegaly.  HEART: Regular rate and rhythm,. No murmurs, no rubs, no clicks.  LUNGS: Clear to auscultation bilaterally. No rales or rhonchi. No wheezes.  ABDOMEN: Soft, flat, nontender, nondistended. Has good bowel sounds. No hepatosplenomegaly appreciated.  EXTREMITIES: Left  upper extremity swelling NEUROLOGIC: The patient is alert, awake, and oriented x3 with no focal motor or sensory deficits appreciated bilaterally.  SKIN: Moist and warm with no rashes appreciated.  Psych: Not anxious, depressed LN: No inguinal LN enlargement    Antibiotics   Anti-infectives (From admission, onward)   None      Medications   Scheduled Meds: . calcium acetate  2,001 mg Oral TID WC  . Chlorhexidine Gluconate Cloth  6 each Topical Q0600  . Chlorhexidine Gluconate Cloth  6 each Topical Q0600  . cinacalcet  60 mg Oral QHS  . escitalopram  20 mg Oral QHS  . gabapentin  300 mg Oral QHS  . loratadine  10 mg Oral Daily  . midodrine  15 mg Oral TID WC  . multivitamin  1 tablet Oral QHS  . multivitamin with minerals  1 tablet Oral Daily  . mupirocin ointment  1 application Nasal BID  . pantoprazole  40 mg Oral Daily  . sevelamer carbonate  3,200 mg Oral TID WC  . warfarin  3 mg Oral ONCE-1800  . Warfarin - Pharmacist Dosing Inpatient   Does not apply q1800   Continuous Infusions: PRN Meds:.acetaminophen **OR** acetaminophen, alprazolam, bisacodyl, diphenhydrAMINE, lidocaine-prilocaine, ondansetron **OR** ondansetron (ZOFRAN) IV, polyethylene glycol, RESOURCE THICKENUP CLEAR, traMADol   Data Review:   Micro Results Recent Results (from the past 240  hour(s))  MRSA PCR Screening     Status: Abnormal   Collection Time: 07/27/17  5:52 PM  Result Value Ref Range Status   MRSA by PCR POSITIVE (A) NEGATIVE Final    Comment:        The GeneXpert MRSA Assay (FDA approved for NASAL specimens only), is one component of a comprehensive MRSA colonization surveillance program. It is not intended to diagnose MRSA infection nor to guide or monitor treatment for MRSA infections. RESULT CALLED TO, READ BACK BY AND VERIFIED WITH: VIVIAN ALI ON 07/27/17 AT 2058 [facility] Site Performed at [facility], [address] [place], [place] [address]     Radiology Reports US Venous Img Upper Uni Left  Result Date: 07/29/2017 CLINICAL DATA:  Left upper extremity edema and occluded left arm dialysis graft. EXAM: LEFT UPPER EXTREMITY VENOUS DOPPLER ULTRASOUND TECHNIQUE: Gray-scale sonography with graded compression, as well as color Doppler and duplex ultrasound were performed to evaluate the upper extremity deep venous system from the level of the subclavian vein and including the jugular, axillary, basilic, radial, ulnar and upper cephalic vein. Spectral Doppler was utilized to evaluate flow at rest and with distal augmentation maneuvers. COMPARISON:  None. FINDINGS: Contralateral Subclavian Vein: Respiratory phasicity is normal and symmetric with the symptomatic side. No evidence of thrombus. Normal compressibility. Internal Jugular Vein: There is some nonocclusive thrombus visualized in the left internal jugular vein. This may relate to prior catheter placement. Subclavian Vein: No evidence of thrombus. Normal compressibility, respiratory phasicity and response to augmentation. Axillary Vein: No evidence of thrombus. Normal compressibility, respiratory phasicity and response to augmentation. Cephalic Vein: No evidence of thrombus. Normal compressibility, respiratory phasicity and response to augmentation. Basilic Vein: No evidence of thrombus. Normal  compressibility, respiratory phasicity and response to augmentation. Brachial Veins: 1 of 2 visualized deep brachial veins in the left upper arm shows evidence of thrombus. Another brachial vein appears widely patent. Radial Veins: No evidence of thrombus. Normal compressibility, respiratory phasicity and response to augmentation. Ulnar Veins: No evidence of thrombus. Normal compressibility, respiratory phasicity and response to augmentation. Venous Reflux:  None visualized. Other  Findings: Occluded dialysis graft versus fistula in the left arm. IMPRESSION: Thrombus in 1 of 2 paired deep brachial veins in the left upper arm. Nonocclusive thrombus also identified in the left internal jugular vein which may be related to an indwelling dialysis catheter. Electronically Signed   By: Irish Lack M.D.   On: 07/29/2017 11:15     CBC Recent Labs  Lab 07/25/17 1906 07/26/17 1542 07/27/17 1541 07/30/17 0650  WBC 4.2 4.4 3.8 4.4  HGB 12.5* 12.2* 12.4* 11.7*  HCT 38.8* 39.3* 40.1 37.2*  PLT 225 231 231 236  MCV 86.9 88.0 88.8 88.6  MCH 27.9 27.3 27.5 27.8  MCHC 32.1 31.0* 31.0* 31.3*  RDW 19.5* 19.4* 19.7* 20.4*  LYMPHSABS  --  0.4*  --   --   MONOABS  --  0.3  --   --   EOSABS  --  0.0  --   --   BASOSABS  --  0.0  --   --     Chemistries  Recent Labs  Lab 07/25/17 1906 07/26/17 1542 07/27/17 1403  NA 143 142 141  K 3.2* 3.4* 3.7  CL 101 101 97*  CO2 31 30 28   GLUCOSE 94 123* 99  BUN 20 25* 30*  CREATININE 6.06* 7.49* 7.98*  CALCIUM 10.0 9.8 9.9  AST 14*  --   --   ALT 15  --   --   ALKPHOS 83  --   --   BILITOT 0.8  --   --    ------------------------------------------------------------------------------------------------------------------ estimated creatinine clearance is 12.6 mL/min (A) (by C-G formula based on SCr of 7.98 mg/dL (H)). ------------------------------------------------------------------------------------------------------------------ No results for input(s):  HGBA1C in the last 72 hours. ------------------------------------------------------------------------------------------------------------------ No results for input(s): CHOL, HDL, LDLCALC, TRIG, CHOLHDL, LDLDIRECT in the last 72 hours. ------------------------------------------------------------------------------------------------------------------ No results for input(s): TSH, T4TOTAL, T3FREE, THYROIDAB in the last 72 hours.  Invalid input(s): FREET3 ------------------------------------------------------------------------------------------------------------------ No results for input(s): VITAMINB12, FOLATE, FERRITIN, TIBC, IRON, RETICCTPCT in the last 72 hours.  Coagulation profile Recent Labs  Lab 07/27/17 1430 07/28/17 0414 07/29/17 1116 07/30/17 0650 07/31/17 0608  INR 2.89 3.33 2.93 3.01 2.68    No results for input(s): DDIMER in the last 72 hours.  Cardiac Enzymes No results for input(s): CKMB, TROPONINI, MYOGLOBIN in the last 168 hours.  Invalid input(s): CK ------------------------------------------------------------------------------------------------------------------ Invalid input(s): POCBNP    Assessment & Plan   Perry [name]  is a 47 y.o. 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  Patient has hemodialysis spot -Hemodialysis currently -Discharge home with PT on Wednesday  2. Hypertension -blood pressure stable. Not on any meds.  3. History of CVA with chronic left-sided hemiplegia/paresis -patient is  bedbound -continue Coumadin. Pharmacy to dose  4. Genella Rife continue PPI  5.   Left upper extremity swelling only due to combination of his stroke and chronic thrombus however he is on Coumadin which we will continue         Code Status Orders  (From admission, onward)        Start  Ordered   07/27/17 1250  Full code  Continuous     07/27/17 1249    Code Status History    Date Active Date Inactive Code Status Order ID Comments User Context   07/11/2016 2158 07/12/2016 1509 Full Code [id number]  Enedina FinnerPatel, Sona, MD Inpatient   05/06/2015 1126 05/08/2015 1456 Full Code [id number]  Erick BlinksMemon, Jehanzeb, MD Inpatient   08/07/2014 1858 08/11/2014 1805 Full Code [id number]  Ardith DarkParker, Caleb M, MD Inpatient   05/05/2014 1632 05/07/2014 1405 Full Code [id number]  Luretha MurphyMartin, Matthew, MD Inpatient   11/16/2013 2301 11/17/2013 1724 Full Code [id number]  Levie HeritageStinson, Jacob J, DO Inpatient           Consults  nephrology   DVT Prophylaxis heparin  Lab Results  Component Value Date   PLT 236 07/30/2017     Time Spent in minutes  25min Greater than 50% of time spent in care coordination and counseling patient regarding the condition and plan of care.   Auburn BilberryShreyang Jonquil Stubbe M.D on 07/31/2017 at 3:10 PM  Between 7am to 6pm - Pager - [phone]  After 6pm go to [email or web address] - Social research officer, governmentpassword [email or web address]  [employer]   Office  [phone]

## 2017-07-31 NOTE — Progress Notes (Signed)
Hd tx ended 

## 2017-07-31 NOTE — Progress Notes (Signed)
[facility]  ROUNDING NOTE   Subjective:   Seen and examined on hemodialysis. Tolerating treatment well. UF goal of 1.5 liters.    HEMODIALYSIS FLOWSHEET:  Blood Flow Rate (mL/min): 400 mL/min Arterial Pressure (mmHg): -200 mmHg Venous Pressure (mmHg): 190 mmHg Transmembrane Pressure (mmHg): 50 mmHg Ultrafiltration Rate (mL/min): 160 mL/min Dialysate Flow Rate (mL/min): 800 ml/min Conductivity: Machine : 13.9 Conductivity: Machine : 13.9 Dialysis Fluid Bolus: Normal Saline Bolus Amount (mL): 250 mL   Objective:  Vital signs in last 24 hours:  Temp:  [97.8 F (36.6 C)-98.9 F (37.2 C)] 98.3 F (36.8 C) (07/15 0413) Pulse Rate:  [85-94] 86 (07/15 0418) Resp:  [18-20] 18 (07/15 0413) BP: (99-114)/(27-35) 99/32 (07/15 0418) SpO2:  [96 %-100 %] 96 % (07/15 0413)  Weight change:  Filed Weights   07/27/17 1454 07/27/17 1845 07/28/17 1420  Weight: 100.9 kg (222 lb 7.1 oz) 100 kg (220 lb 7.4 oz) 97.5 kg (214 lb 14.4 oz)    Intake/Output: No intake/output data recorded.   Intake/Output this shift:  No intake/output data recorded.  Physical Exam: General: No acute distress, laying in bed  Head: Normocephalic, atraumatic. Moist oral mucosal membranes  Eyes: Anicteric  Neck: Supple, trachea midline  Lungs:  clear  Heart: S1S2 no rubs  Abdomen:  Soft, nontender, bowel sounds present  Extremities: Trace bilateral lower extremity edema  Neurologic: Responds to verbal stimuli  Skin: No rashes  Access: L IJ permcath    Basic Metabolic Panel: Recent Labs  Lab 07/25/17 1906 07/26/17 1542 07/27/17 1403 07/28/17 1606  NA 143 142 141  --   K 3.2* 3.4* 3.7  --   CL 101 101 97*  --   CO2 31 30 28   --   GLUCOSE 94 123* 99  --   BUN 20 25* 30*  --   CREATININE 6.06* 7.49* 7.98*  --   CALCIUM 10.0 9.8 9.9  --   PHOS  --   --  4.4 3.7    Liver Function Tests: Recent Labs  Lab 07/25/17 1906 07/27/17 1403  AST 14*  --   ALT 15  --   ALKPHOS 83  --    BILITOT 0.8  --   PROT 6.2*  --   ALBUMIN 3.1* 3.8   No results for input(s): LIPASE, AMYLASE in the last 168 hours. No results for input(s): AMMONIA in the last 168 hours.  CBC: Recent Labs  Lab 07/25/17 1906 07/26/17 1542 07/27/17 1541 07/30/17 0650  WBC 4.2 4.4 3.8 4.4  NEUTROABS  --  3.6  --   --   HGB 12.5* 12.2* 12.4* 11.7*  HCT 38.8* 39.3* 40.1 37.2*  MCV 86.9 88.0 88.8 88.6  PLT 225 231 231 236    Cardiac Enzymes: No results for input(s): CKTOTAL, CKMB, CKMBINDEX, TROPONINI in the last 168 hours.  BNP: Invalid input(s): POCBNP  CBG: No results for input(s): GLUCAP in the last 168 hours.  Microbiology: Results for orders placed or performed during the hospital encounter of 07/25/17  MRSA PCR Screening     Status: Abnormal   Collection Time: 07/27/17  5:52 PM  Result Value Ref Range Status   MRSA by PCR POSITIVE (A) NEGATIVE Final    Comment:        The GeneXpert MRSA Assay (FDA approved for NASAL specimens only), is one component of a comprehensive MRSA colonization surveillance program. It is not intended to diagnose MRSA infection nor to guide or monitor treatment for MRSA infections.  RESULT CALLED TO, READ BACK BY AND VERIFIED WITH: VIVIAN ALI ON 07/27/17 AT 2058 [facility] Performed at [facility], [address] [place], [place] [address]     Coagulation Studies: Recent Labs    07/29/17 1116 07/30/17 0650 07/31/17 0608  LABPROT 30.3* 31.0* 28.3*  INR 2.93 3.01 2.68    Urinalysis: No results for input(s): COLORURINE, LABSPEC, PHURINE, GLUCOSEU, HGBUR, BILIRUBINUR, KETONESUR, PROTEINUR, UROBILINOGEN, NITRITE, LEUKOCYTESUR in the last 72 hours.  Invalid input(s): APPERANCEUR    Imaging: No results found.   Medications:    . calcium acetate  2,001 mg Oral TID WC  . Chlorhexidine Gluconate Cloth  6 each Topical Q0600  . Chlorhexidine Gluconate Cloth  6 each Topical Q0600  . cinacalcet  60 mg Oral QHS  . escitalopram  20 mg  Oral QHS  . gabapentin  300 mg Oral QHS  . loratadine  10 mg Oral Daily  . midodrine  15 mg Oral TID WC  . multivitamin  1 tablet Oral QHS  . multivitamin with minerals  1 tablet Oral Daily  . mupirocin ointment  1 application Nasal BID  . pantoprazole  40 mg Oral Daily  . sevelamer carbonate  3,200 mg Oral TID WC  . warfarin  3 mg Oral ONCE-1800  . Warfarin - Pharmacist Dosing Inpatient   Does not apply q1800   acetaminophen **OR** acetaminophen, alprazolam, bisacodyl, diphenhydrAMINE, lidocaine-prilocaine, ondansetron **OR** ondansetron (ZOFRAN) IV, polyethylene glycol, RESOURCE THICKENUP CLEAR, traMADol  Assessment/ Plan:  47 y.o. black male with End-stage renal disease on hemodialysis MWF followed by [employer], chronic back pain, GERD, hypertension, seizure disorder, obstructive sleep apnea, anemia chronic kidney disease, secondary hyperparathyroidism, anxiety who is now admitted for reestablishing outpt dialysis in [facility].   1.  ESRD on HD : seen and examined on hemodialysis. Tolerating treatment well.  - Outpatient planning for TTS [facility] [address] followed by [facility])  2.  Anemia of chronic kidney disease.  Hemoglobin 11.7. No indication for ESA  3.  Secondary hyperparathyroidism.  Phosphorus and calcium at goal.  PTH low at 132 - sevelamer and calcium acetate with meals - cinacalcet  4.  Hypotension.   - midodrine 15 mg p.o. every 8 hours.   LOS: 4 Massa Pe 7/15/201911:19 AM

## 2017-07-31 NOTE — Clinical Social Work Note (Signed)
According to Marchelle FolksAmanda, the Dialysis Liason, patient has a chair [facility] 11:30 on [facility]aturdays [facility]. Patient has had no bed offers from local nursing homes and patient's mother was aware on Friday that patient would return home at discharge if no facilities could offer. York SpanielMonica Loralai Eisman MSW,LCSW [phone]

## 2017-07-31 NOTE — Progress Notes (Signed)
   07/29/17 1842  PPD Results  Does patient have an induration at the injection site? No  Induration(mm) 0 mm  Name of Physician Notified no one

## 2017-08-01 ENCOUNTER — Inpatient Hospital Stay: Payer: Medicare Other | Admitting: Family Medicine

## 2017-08-01 LAB — PROTIME-INR
INR: 2.3
PROTHROMBIN TIME: 25.1 s — AB (ref 11.4–15.2)

## 2017-08-01 MED ORDER — NEPRO/CARBSTEADY PO LIQD
237.0000 mL | Freq: Two times a day (BID) | ORAL | Status: DC
Start: 2017-08-01 — End: 2017-08-02
  Administered 2017-08-01 – 2017-08-02 (×3): 237 mL via ORAL

## 2017-08-01 MED ORDER — WARFARIN SODIUM 3 MG PO TABS
3.0000 mg | ORAL_TABLET | Freq: Once | ORAL | Status: AC
  Administered 2017-08-01: 3 mg via ORAL
  Filled 2017-08-01: qty 1

## 2017-08-01 MED ORDER — MODAFINIL 100 MG PO TABS
100.0000 mg | ORAL_TABLET | Freq: Every day | ORAL | Status: DC
  Administered 2017-08-01 – 2017-08-02 (×2): 100 mg via ORAL
  Filled 2017-08-01 (×2): qty 1

## 2017-08-01 NOTE — Progress Notes (Signed)
[facility]  ROUNDING NOTE   Subjective:   Seen and examined on hemodialysis. Tolerating treatment well. UF goal of 1.5 liters. Seated in chair during treatment.    HEMODIALYSIS FLOWSHEET:  Blood Flow Rate (mL/min): (P) 400 mL/min Arterial Pressure (mmHg): (P) -220 mmHg Venous Pressure (mmHg): (P) 200 mmHg Transmembrane Pressure (mmHg): (P) 40 mmHg Ultrafiltration Rate (mL/min): (P) 500 mL/min Dialysate Flow Rate (mL/min): (P) 600 ml/min Conductivity: Machine : (P) 13.6 Conductivity: Machine : (P) 13.6 Dialysis Fluid Bolus: Normal Saline Bolus Amount (mL): 250 mL Dialysate Change: 2K   Objective:  Vital signs in last 24 hours:  Temp:  [97.4 F (36.3 C)-97.7 F (36.5 C)] 97.5 F (36.4 C) (07/16 1115) Pulse Rate:  [72-86] (P) 84 (07/16 1230) Resp:  [13-23] (P) 19 (07/16 1230) BP: (84-119)/(29-41) (P) 89/33 (07/16 1230) SpO2:  [97 %-100 %] 97 % (07/16 1115)  Weight change:  Filed Weights   07/27/17 1845 07/28/17 1420 07/31/17 0945  Weight: 100 kg (220 lb 7.4 oz) 97.5 kg (214 lb 14.4 oz) 97.4 kg (214 lb 11.7 oz)    Intake/Output: I/O last 3 completed shifts: In: 60 [P.O.:60] Out: 2000 [Other:2000]   Intake/Output this shift:  No intake/output data recorded.  Physical Exam: General: No acute distress, seated in chair  Head: Normocephalic, atraumatic. Moist oral mucosal membranes  Eyes: Anicteric  Neck: Supple, trachea midline  Lungs:  clear  Heart: S1S2 no rubs  Abdomen:  Soft, nontender, bowel sounds present  Extremities: No bilateral lower extremity edema  Neurologic: Responds to verbal stimuli  Skin: No rashes  Access: L IJ permcath    Basic Metabolic Panel: Recent Labs  Lab 07/25/17 1906 07/26/17 1542 07/27/17 1403 07/28/17 1606  NA 143 142 141  --   K 3.2* 3.4* 3.7  --   CL 101 101 97*  --   CO2 31 30 28   --   GLUCOSE 94 123* 99  --   BUN 20 25* 30*  --   CREATININE 6.06* 7.49* 7.98*  --   CALCIUM 10.0 9.8 9.9  --   PHOS  --    --  4.4 3.7    Liver Function Tests: Recent Labs  Lab 07/25/17 1906 07/27/17 1403  AST 14*  --   ALT 15  --   ALKPHOS 83  --   BILITOT 0.8  --   PROT 6.2*  --   ALBUMIN 3.1* 3.8   No results for input(s): LIPASE, AMYLASE in the last 168 hours. No results for input(s): AMMONIA in the last 168 hours.  CBC: Recent Labs  Lab 07/25/17 1906 07/26/17 1542 07/27/17 1541 07/30/17 0650  WBC 4.2 4.4 3.8 4.4  NEUTROABS  --  3.6  --   --   HGB 12.5* 12.2* 12.4* 11.7*  HCT 38.8* 39.3* 40.1 37.2*  MCV 86.9 88.0 88.8 88.6  PLT 225 231 231 236    Cardiac Enzymes: No results for input(s): CKTOTAL, CKMB, CKMBINDEX, TROPONINI in the last 168 hours.  BNP: Invalid input(s): POCBNP  CBG: No results for input(s): GLUCAP in the last 168 hours.  Microbiology: Results for orders placed or performed during the hospital encounter of 07/25/17  MRSA PCR Screening     Status: Abnormal   Collection Time: 07/27/17  5:52 PM  Result Value Ref Range Status   MRSA by PCR POSITIVE (A) NEGATIVE Final    Comment:        The GeneXpert MRSA Assay (FDA approved for NASAL specimens only), is  one component of a comprehensive MRSA colonization surveillance program. It is not intended to diagnose MRSA infection nor to guide or monitor treatment for MRSA infections. RESULT CALLED TO, READ BACK BY AND VERIFIED WITH: VIVIAN ALI ON 07/27/17 AT 2058 [facility] Performed at [facility], [address] [place], [place] [address]     Coagulation Studies: Recent Labs    07/30/17 0650 07/31/17 0608 08/01/17 0515  LABPROT 31.0* 28.3* 25.1*  INR 3.01 2.68 2.30    Urinalysis: No results for input(s): COLORURINE, LABSPEC, PHURINE, GLUCOSEU, HGBUR, BILIRUBINUR, KETONESUR, PROTEINUR, UROBILINOGEN, NITRITE, LEUKOCYTESUR in the last 72 hours.  Invalid input(s): APPERANCEUR    Imaging: No results found.   Medications:    . calcium acetate  2,001 mg Oral TID WC  . Chlorhexidine Gluconate  Cloth  6 each Topical Q0600  . cinacalcet  60 mg Oral QHS  . escitalopram  20 mg Oral QHS  . feeding supplement (NEPRO CARB STEADY)  237 mL Oral BID BM  . gabapentin  300 mg Oral QHS  . loratadine  10 mg Oral Daily  . midodrine  15 mg Oral TID WC  . multivitamin  1 tablet Oral QHS  . multivitamin with minerals  1 tablet Oral Daily  . pantoprazole  40 mg Oral Daily  . sevelamer carbonate  3,200 mg Oral TID WC  . warfarin  3 mg Oral ONCE-1800  . Warfarin - Pharmacist Dosing Inpatient   Does not apply q1800   acetaminophen **OR** acetaminophen, alprazolam, bisacodyl, diphenhydrAMINE, lidocaine-prilocaine, ondansetron **OR** ondansetron (ZOFRAN) IV, polyethylene glycol, RESOURCE THICKENUP CLEAR, traMADol  Assessment/ Plan:  47 y.o. black male with End-stage renal disease on hemodialysis MWF followed by [employer], chronic back pain, GERD, hypertension, seizure disorder, obstructive sleep apnea, anemia chronic kidney disease, secondary hyperparathyroidism, anxiety who is now admitted for reestablishing outpt dialysis in [facility].   1.  ESRD on HD : seen and examined on hemodialysis. Tolerating treatment well.  - [facility] [address] [facility] - Will be living at home with his mother  2.  Anemia of chronic kidney disease.   No indication for ESA  3.  Secondary hyperparathyroidism.  Phosphorus and calcium at goal.  PTH low at 132 - sevelamer and calcium acetate with meals - cinacalcet  4.  Hypotension.   - midodrine 15 mg p.o. every 8 hours.   LOS: 5 Perry [name] 7/16/201912:53 PM

## 2017-08-01 NOTE — Progress Notes (Signed)
Pt. Tolerated hemodialysis with hypotension:    08/01/17 1415  Vital Signs  Temp 98 F (36.7 C)  Temp Source Oral  Pulse Rate 81  Pulse Rate Source Dinamap  Resp 20  BP (!) 90/37  BP Location Right Arm  BP Method Automatic  Patient Position (if appropriate) Lying  Oxygen Therapy  SpO2 95 %  O2 Device Nasal Cannula  O2 Flow Rate (L/min) 2 L/min  Pain Assessment  Pain Scale 0-10  Pain Score 0  Dialysis Weight  Weight  (unable to weigh; pt cannot stand; hoyer lift)  Post-Hemodialysis Assessment  Rinseback Volume (mL) 250 mL  Dialyzer Clearance Heavily streaked  Duration of HD Treatment -hour(s) 3 hour(s)  Hemodialysis Intake (mL) 500 mL  UF Total -Machine (mL) 1200 mL  Net UF (mL) 700 mL  Tolerated HD Treatment Yes  Post-Hemodialysis Comments Pt. tolerated hemodialysis with hypotension  Education / Care Plan  Dialysis Education Provided Yes  Hemodialysis Catheter Left Subclavian Double-lumen  No Placement Date or Time found.   Orientation: Left  Access Location: Subclavian  Hemodialysis Catheter Type: Double-lumen  Site Condition No complications  Blue Lumen Status Capped (Central line)  Red Lumen Status Capped (Central line)  Catheter fill solution Heparin 1000 units/ml  Catheter fill volume (Arterial) 2 cc  Catheter fill volume (Venous) 2  Dressing Status Clean;Dry;Intact  Dressing Change Due 08/03/17

## 2017-08-01 NOTE — Care Management (Signed)
RNCM updated PCP that patient still in hospital and that appointment will need to be rescheduled. His appointment was today.

## 2017-08-01 NOTE — Care Management (Signed)
Appointment rescheduled for [name] for 08/07/17 at 12:15

## 2017-08-01 NOTE — Care Management (Signed)
Voicemail left for mother to discuss delivery of equipment.

## 2017-08-01 NOTE — Care Management (Signed)
Message left for Dr. Caren MacadamPickard's office to reschedule appointment.  Awaiting return call. Plan for patient to discharge home tomorrow.  Barbara CowerJason with [facility] aware.  Plan for bed, lift, and WC to be delivered prior to patient arriving at house.    Per Dimas ChyleAmanda Morris outpatient HD schedule is as follows.  TTS at 11:20 am at [facility] on [address]  Dimas ChyleAmanda Morris at bedside now to discuss schedule and transportation with patient's mother.

## 2017-08-01 NOTE — Progress Notes (Signed)
ANTICOAGULATION CONSULT NOTE  Pharmacy Consult for Warfarin Indication: CVA/AF?  Allergies  Allergen Reactions  . Depakote [Divalproex Sodium] Other (See Comments)    Hallucinations     Patient Measurements: Height: 5\' 6"  (167.6 cm) Weight: 214 lb 11.7 oz (97.4 kg) IBW/kg (Calculated) : 63.8  Vital Signs: Temp: 97.4 F (36.3 C) (07/16 0459) Temp Source: Oral (07/16 0459) BP: 103/41 (07/16 0459) Pulse Rate: 82 (07/16 0459)  Labs: Recent Labs    07/30/17 0650 07/31/17 0608 08/01/17 0515  HGB 11.7*  --   --   HCT 37.2*  --   --   PLT 236  --   --   LABPROT 31.0* 28.3* 25.1*  INR 3.01 2.68 2.30    Estimated Creatinine Clearance: 12.6 mL/min (A) (by C-G formula based on SCr of 7.98 mg/dL (H)).   Medical History: Past Medical History:  Diagnosis Date  . Acute meniscal tear of left knee   . Acute meniscal tear of right knee   . Anxiety   . Chronic back pain    due to weight   . Dialysis patient (HCC)   . Fracture of transverse process of thoracic vertebra (HCC)    [facility]T9[facility]- cleared by neurosurgery  . GERD (gastroesophageal reflux disease)   . Hemodialysis patient [facility](HCC)    since 2011, does hemodialysis at home, wife does she is a [employer] , Daily except wed and sun  . High blood pressure    hx of   . Kidney failure    on dialysis since 2011- [facility] nephrolog[facility]   . Seizures (HCC)   . 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 PTA dosing of 4 alternating with 4.5 mg warfarin daily.   Goal of Therapy:  INR 2-3  Date  INR  Coumadin dose   7/10  3.02  4 mg 7/11  2.89  4 mg 7/12  3.33  HOLD 7/13  2.93  3 mg 7/14  3.01  HOLD 7/15                 2.68                 3 mg 7/16  2.30    A/P: Will order warfarin 3mg  dose again tonight and f/u AM INR.   Bari MantisKristin Shawntae Lowy PharmD Clinical Pharmacist 08/01/2017

## 2017-08-01 NOTE — Progress Notes (Signed)
PT Cancellation Note  Patient Details Name: Perry [name] MRN: [id number] DOB: 11-06-1970   Cancelled Treatment:    Reason Eval/Treat Not Completed: Patient at procedure or test/unavailable.  Chart reviewed.  Nurse reporting pt currently off floor at dialysis (staff used hoyer lift to transfer pt to chair for dialysis session today).  Will re-attempt PT treatment at a later date/time.  Hendricks LimesEmily Rhona Fusilier, PT 08/01/17, 1:12 PM [phone]

## 2017-08-01 NOTE — Progress Notes (Signed)
[facility] [place] [facility]                                                                                                                                                                                  Patient Demographics   Perry [name], is a 47 y.o. male, DOB - January 19, 1970, EAV:[id number]  Admit date - 07/25/2017   Admitting Physician Enedina Finner, MD  Outpatient Primary MD for the patient is Donita Brooks, MD   LOS - 5  Subjective: Patient denies any complaints he was seen in dialysis he is sleepy Review of Systems:   CONSTITUTIONAL: No documented fever. No fatigue, weakness. No weight gain, no weight loss.  EYES: No blurry or double vision.  ENT: No tinnitus. No postnasal drip. No redness of the oropharynx.  RESPIRATORY: No cough, no wheeze, no hemoptysis. No dyspnea.  CARDIOVASCULAR: No chest pain. No orthopnea. No palpitations. No syncope.  GASTROINTESTINAL: No nausea, no vomiting or diarrhea. No abdominal pain. No melena or hematochezia.  GENITOURINARY: No dysuria or hematuria.  ENDOCRINE: No polyuria or nocturia. No heat or cold intolerance.  HEMATOLOGY: No anemia. No bruising. No bleeding.  INTEGUMENTARY: No rashes. No lesions.  MUSCULOSKELETAL: No arthritis. No swelling. No gout.  NEUROLOGIC: No numbness, tingling, or ataxia. No seizure-type activity.  PSYCHIATRIC: No anxiety. No insomnia. No ADD.    Vitals:   Vitals:   08/01/17 1200 08/01/17 1230 08/01/17 1300 08/01/17 1330  BP: (!) 95/37 (!) 89/33 (!) 91/33 (!) (P) 69/18  Pulse: 78 84 76 (P) 81  Resp: (!) 21 19    Temp:      TempSrc:      SpO2:      Weight:      Height:        Wt Readings from Last 3 Encounters:  07/31/17 97.4 kg (214 lb 11.7 oz)  07/11/16 103 kg (227 lb 1.2 oz)  05/08/15 117.8 kg (259 lb 11.2 oz)     Intake/Output Summary (Last 24 hours) at 08/01/2017 1406 Last data filed at 08/01/2017 0525 Gross per 24 hour  Intake -  Output 0 ml  Net 0 ml    Physical  Exam:   GENERAL: Pleasant-appearing in no apparent distress.  HEAD, EYES, EARS, NOSE AND THROAT: Atraumatic, normocephalic. Extraocular muscles are intact. Pupils equal and reactive to light. Sclerae anicteric. No conjunctival injection. No oro-pharyngeal erythema.  NECK: Supple. There is no jugular venous distention. No bruits, no lymphadenopathy, no thyromegaly.  HEART: Regular rate and rhythm,. No murmurs, no rubs, no clicks.  LUNGS: Clear to auscultation bilaterally. No rales or rhonchi. No wheezes.  ABDOMEN: Soft, flat, nontender, nondistended. Has good bowel  sounds. No hepatosplenomegaly appreciated.  EXTREMITIES: Left upper extremity swelling NEUROLOGIC: The patient is alert, awake, and oriented x3 with no focal motor or sensory deficits appreciated bilaterally.  SKIN: Moist and warm with no rashes appreciated.  Psych: Not anxious, depressed LN: No inguinal LN enlargement    Antibiotics   Anti-infectives (From admission, onward)   None      Medications   Scheduled Meds: . calcium acetate  2,001 mg Oral TID WC  . Chlorhexidine Gluconate Cloth  6 each Topical Q0600  . cinacalcet  60 mg Oral QHS  . escitalopram  20 mg Oral QHS  . feeding supplement (NEPRO CARB STEADY)  237 mL Oral BID BM  . gabapentin  300 mg Oral QHS  . loratadine  10 mg Oral Daily  . midodrine  15 mg Oral TID WC  . modafinil  100 mg Oral Daily  . multivitamin  1 tablet Oral QHS  . multivitamin with minerals  1 tablet Oral Daily  . pantoprazole  40 mg Oral Daily  . sevelamer carbonate  3,200 mg Oral TID WC  . warfarin  3 mg Oral ONCE-1800  . Warfarin - Pharmacist Dosing Inpatient   Does not apply q1800   Continuous Infusions: PRN Meds:.acetaminophen **OR** acetaminophen, alprazolam, bisacodyl, diphenhydrAMINE, lidocaine-prilocaine, ondansetron **OR** ondansetron (ZOFRAN) IV, polyethylene glycol, RESOURCE THICKENUP CLEAR, traMADol   Data Review:   Micro Results Recent Results (from the past 240  hour(s))  MRSA PCR Screening     Status: Abnormal   Collection Time: 07/27/17  5:52 PM  Result Value Ref Range Status   MRSA by PCR POSITIVE (A) NEGATIVE Final    Comment:        The GeneXpert MRSA Assay (FDA approved for NASAL specimens only), is one component of a comprehensive MRSA colonization surveillance program. It is not intended to diagnose MRSA infection nor to guide or monitor treatment for MRSA infections. RESULT CALLED TO, READ BACK BY AND VERIFIED WITH: VIVIAN ALI ON 07/27/17 AT 2058 [facility] Performed at [facility], [address], [place], [place] [address]     Radiology Reports US Venous Img Upper Uni Left  Result Date: 07/29/2017 CLINICAL DATA:  Left upper extremity edema and occluded left arm dialysis graft. EXAM: LEFT UPPER EXTREMITY VENOUS DOPPLER ULTRASOUND TECHNIQUE: Gray-scale sonography with graded compression, as well as color Doppler and duplex ultrasound were performed to evaluate the upper extremity deep venous system from the level of the subclavian vein and including the jugular, axillary, basilic, radial, ulnar and upper cephalic vein. Spectral Doppler was utilized to evaluate flow at rest and with distal augmentation maneuvers. COMPARISON:  None. FINDINGS: Contralateral Subclavian Vein: Respiratory phasicity is normal and symmetric with the symptomatic side. No evidence of thrombus. Normal compressibility. Internal Jugular Vein: There is some nonocclusive thrombus visualized in the left internal jugular vein. This may relate to prior catheter placement. Subclavian Vein: No evidence of thrombus. Normal compressibility, respiratory phasicity and response to augmentation. Axillary Vein: No evidence of thrombus. Normal compressibility, respiratory phasicity and response to augmentation. Cephalic Vein: No evidence of thrombus. Normal compressibility, respiratory phasicity and response to augmentation. Basilic Vein: No evidence of thrombus. Normal  compressibility, respiratory phasicity and response to augmentation. Brachial Veins: 1 of 2 visualized deep brachial veins in the left upper arm shows evidence of thrombus. Another brachial vein appears widely patent. Radial Veins: No evidence of thrombus. Normal compressibility, respiratory phasicity and response to augmentation. Ulnar Veins: No evidence of thrombus. Normal compressibility, respiratory phasicity and  response to augmentation. Venous Reflux:  None visualized. Other Findings: Occluded dialysis graft versus fistula in the left arm. IMPRESSION: Thrombus in 1 of 2 paired deep brachial veins in the left upper arm. Nonocclusive thrombus also identified in the left internal jugular vein which may be related to an indwelling dialysis catheter. Electronically Signed   By: Irish LackGlenn  Yamagata M.D.   On: 07/29/2017 11:15     CBC Recent Labs  Lab 07/25/17 1906 07/26/17 1542 07/27/17 1541 07/30/17 0650  WBC 4.2 4.4 3.8 4.4  HGB 12.5* 12.2* 12.4* 11.7*  HCT 38.8* 39.3* 40.1 37.2*  PLT 225 231 231 236  MCV 86.9 88.0 88.8 88.6  MCH 27.9 27.3 27.5 27.8  MCHC 32.1 31.0* 31.0* 31.3*  RDW 19.5* 19.4* 19.7* 20.4*  LYMPHSABS  --  0.4*  --   --   MONOABS  --  0.3  --   --   EOSABS  --  0.0  --   --   BASOSABS  --  0.0  --   --     Chemistries  Recent Labs  Lab 07/25/17 1906 07/26/17 1542 07/27/17 1403  NA 143 142 141  K 3.2* 3.4* 3.7  CL 101 101 97*  CO2 31 30 28   GLUCOSE 94 123* 99  BUN 20 25* 30*  CREATININE 6.06* 7.49* 7.98*  CALCIUM 10.0 9.8 9.9  AST 14*  --   --   ALT 15  --   --   ALKPHOS 83  --   --   BILITOT 0.8  --   --    ------------------------------------------------------------------------------------------------------------------ estimated creatinine clearance is 12.6 mL/min (A) (by C-G formula based on SCr of 7.98 mg/dL (H)). ------------------------------------------------------------------------------------------------------------------ No results for input(s):  HGBA1C in the last 72 hours. ------------------------------------------------------------------------------------------------------------------ No results for input(s): CHOL, HDL, LDLCALC, TRIG, CHOLHDL, LDLDIRECT in the last 72 hours. ------------------------------------------------------------------------------------------------------------------ No results for input(s): TSH, T4TOTAL, T3FREE, THYROIDAB in the last 72 hours.  Invalid input(s): FREET3 ------------------------------------------------------------------------------------------------------------------ No results for input(s): VITAMINB12, FOLATE, FERRITIN, TIBC, IRON, RETICCTPCT in the last 72 hours.  Coagulation profile Recent Labs  Lab 07/28/17 0414 07/29/17 1116 07/30/17 0650 07/31/17 0608 08/01/17 0515  INR 3.33 2.93 3.01 2.68 2.30    No results for input(s): DDIMER in the last 72 hours.  Cardiac Enzymes No results for input(s): CKMB, TROPONINI, MYOGLOBIN in the last 168 hours.  Invalid input(s): CK ------------------------------------------------------------------------------------------------------------------ Invalid input(s): POCBNP    Assessment & Plan   Perry [name]  is a 47 y.o. 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  Patient has hemodialysis spot --Discharge home with PT on Wednesday  2. Hypertension -blood pressure stable. Not on any meds.  3. History of CVA with chronic left-sided hemiplegia/paresis -patient is bedbound -continue Coumadin.  Pharmacy to dose -I will add provigil to regimen  4. Genella RifeGerd continue PPI  5.   Left upper extremity swelling only due to combination of his stroke and chronic thrombus however he is on Coumadin which we will continue         Code Status Orders  (From admission,  onward)        Start     Ordered   07/27/17 1250  Full code  Continuous     07/27/17 1249    Code Status History    Date Active Date Inactive Code Status Order ID Comments User Context   07/11/2016 2158 07/12/2016 1509 Full Code [id number]  Enedina Finner, MD Inpatient   05/06/2015 1126 05/08/2015 1456 Full Code [id number]  Erick Blinks, MD Inpatient   08/07/2014 1858 08/11/2014 1805 Full Code [id number]  Ardith Dark, MD Inpatient   05/05/2014 1632 05/07/2014 1405 Full Code [id number]  Luretha Murphy, MD Inpatient   11/16/2013 2301 11/17/2013 1724 Full Code [id number]  Levie Heritage, DO Inpatient           Consults  nephrology   DVT Prophylaxis heparin  Lab Results  Component Value Date   PLT 236 07/30/2017     Time Spent in minutes  Greater than 50% of time spent in care coordination and counseling patient regarding the condition and plan of care.   Auburn Bilberry M.D on 08/01/2017 at 2:06 PM  Between 7am to 6pm - Pager - [phone]  After 6pm go to [email or web address] - Social research officer, government  [employer]   Office  [phone]

## 2017-08-02 ENCOUNTER — Emergency Department: Payer: Medicare Other

## 2017-08-02 ENCOUNTER — Other Ambulatory Visit: Payer: Self-pay

## 2017-08-02 ENCOUNTER — Inpatient Hospital Stay (HOSPITAL_COMMUNITY)
Admission: EM | Admit: 2017-08-02 | Discharge: 2017-08-11 | Disposition: A | Discharge status: Home / Self Care | Payer: Medicare Other | Source: Home / Self Care | Attending: Family Medicine | Admitting: Family Medicine

## 2017-08-02 DIAGNOSIS — A419 Sepsis, unspecified organism: Secondary | ICD-10-CM | POA: Diagnosis present

## 2017-08-02 DIAGNOSIS — I351 Nonrheumatic aortic (valve) insufficiency: Secondary | ICD-10-CM

## 2017-08-02 DIAGNOSIS — Z7189 Other specified counseling: Secondary | ICD-10-CM

## 2017-08-02 DIAGNOSIS — J189 Pneumonia, unspecified organism: Secondary | ICD-10-CM

## 2017-08-02 DIAGNOSIS — R0902 Hypoxemia: Secondary | ICD-10-CM

## 2017-08-02 DIAGNOSIS — N186 End stage renal disease: Secondary | ICD-10-CM

## 2017-08-02 DIAGNOSIS — Z515 Encounter for palliative care: Secondary | ICD-10-CM

## 2017-08-02 DIAGNOSIS — R443 Hallucinations, unspecified: Secondary | ICD-10-CM

## 2017-08-02 LAB — CBC WITH DIFFERENTIAL/PLATELET
BASOS ABS: 0 10*3/uL (ref 0–0.1)
BASOS PCT: 1 %
EOS PCT: 6 %
Eosinophils Absolute: 0.3 10*3/uL (ref 0–0.7)
HCT: 40.5 % (ref 40.0–52.0)
Hemoglobin: 12.6 g/dL — ABNORMAL LOW (ref 13.0–18.0)
Lymphocytes Relative: 18 %
Lymphs Abs: 0.7 10*3/uL — ABNORMAL LOW (ref 1.0–3.6)
MCH: 27.5 pg (ref 26.0–34.0)
MCHC: 31.1 g/dL — ABNORMAL LOW (ref 32.0–36.0)
MCV: 88.3 fL (ref 80.0–100.0)
Monocytes Absolute: 0.4 10*3/uL (ref 0.2–1.0)
Monocytes Relative: 10 %
Neutro Abs: 2.7 10*3/uL (ref 1.4–6.5)
Neutrophils Relative %: 65 %
PLATELETS: 229 10*3/uL (ref 150–440)
RBC: 4.59 MIL/uL (ref 4.40–5.90)
RDW: 20.1 % — ABNORMAL HIGH (ref 11.5–14.5)
WBC: 4.1 10*3/uL (ref 3.8–10.6)

## 2017-08-02 LAB — COMPREHENSIVE METABOLIC PANEL
ALT: 33 U/L (ref 0–44)
AST: 25 U/L (ref 15–41)
Albumin: 2.9 g/dL — ABNORMAL LOW (ref 3.5–5.0)
Alkaline Phosphatase: 85 U/L (ref 38–126)
Anion gap: 9 (ref 5–15)
BILIRUBIN TOTAL: 0.9 mg/dL (ref 0.3–1.2)
BUN: 23 mg/dL — ABNORMAL HIGH (ref 6–20)
CALCIUM: 8.6 mg/dL — AB (ref 8.9–10.3)
CO2: 32 mmol/L (ref 22–32)
CREATININE: 6.11 mg/dL — AB (ref 0.61–1.24)
Chloride: 102 mmol/L (ref 98–111)
GFR calc Af Amer: 12 mL/min — ABNORMAL LOW (ref >60)
GFR, EST NON AFRICAN AMERICAN: 10 mL/min — AB (ref >60)
Glucose, Bld: 98 mg/dL (ref 70–99)
Potassium: 4.4 mmol/L (ref 3.5–5.1)
Sodium: 143 mmol/L (ref 135–145)
TOTAL PROTEIN: 6 g/dL — AB (ref 6.5–8.1)

## 2017-08-02 LAB — CBC
HEMATOCRIT: 39.2 % — AB (ref 40.0–52.0)
Hemoglobin: 12.4 g/dL — ABNORMAL LOW (ref 13.0–18.0)
MCH: 28.1 pg (ref 26.0–34.0)
MCHC: 31.5 g/dL — ABNORMAL LOW (ref 32.0–36.0)
MCV: 89.2 fL (ref 80.0–100.0)
Platelets: 215 10*3/uL (ref 150–440)
RBC: 4.39 MIL/uL — AB (ref 4.40–5.90)
RDW: 20.2 % — AB (ref 11.5–14.5)
WBC: 4.3 10*3/uL (ref 3.8–10.6)

## 2017-08-02 LAB — PROTIME-INR
INR: 2.3
INR: 2.06
PROTHROMBIN TIME: 25.1 s — AB (ref 11.4–15.2)
Prothrombin Time: 23 seconds — ABNORMAL HIGH (ref 11.4–15.2)

## 2017-08-02 LAB — TROPONIN I: TROPONIN I: 0.03 ng/mL — AB (ref <0.03)

## 2017-08-02 LAB — LACTIC ACID, PLASMA: LACTIC ACID, VENOUS: 1.6 mmol/L (ref 0.5–1.9)

## 2017-08-02 LAB — PHOSPHORUS: Phosphorus: 2.1 mg/dL — ABNORMAL LOW (ref 2.5–4.6)

## 2017-08-02 MED ORDER — LIDOCAINE-PRILOCAINE 2.5-2.5 % EX CREA: 1.0000 "application " | TOPICAL_CREAM | CUTANEOUS | 0 refills | Status: AC | PRN

## 2017-08-02 MED ORDER — ESCITALOPRAM OXALATE 10 MG PO TABS
20.0000 mg | ORAL_TABLET | Freq: Every day | ORAL | Status: DC
  Administered 2017-08-03 – 2017-08-07 (×5): 20 mg via ORAL
  Filled 2017-08-02 (×7): qty 2

## 2017-08-02 MED ORDER — PANTOPRAZOLE SODIUM 20 MG PO TBEC: 20.0000 mg | DELAYED_RELEASE_TABLET | Freq: Every day | ORAL | 1 refills | Status: AC

## 2017-08-02 MED ORDER — ESCITALOPRAM OXALATE 20 MG PO TABS
20.0000 mg | ORAL_TABLET | Freq: Every day | ORAL | 5 refills | Status: DC
Start: 2017-08-02 — End: 2017-08-11

## 2017-08-02 MED ORDER — TRAMADOL HCL 50 MG PO TABS: 50.0000 mg | ORAL_TABLET | Freq: Two times a day (BID) | ORAL | 0 refills | Status: DC | PRN

## 2017-08-02 MED ORDER — ALPRAZOLAM 2 MG PO TABS: ORAL_TABLET | ORAL | 0 refills | Status: DC

## 2017-08-02 MED ORDER — POLYETHYLENE GLYCOL 3350 17 G PO PACK: 1.0000 | PACK | Freq: Every day | ORAL | 0 refills | Status: AC | PRN

## 2017-08-02 MED ORDER — GABAPENTIN 300 MG PO CAPS: 300.0000 mg | ORAL_CAPSULE | Freq: Every day | ORAL | 0 refills | Status: DC

## 2017-08-02 MED ORDER — MIDODRINE HCL 10 MG PO TABS: 15.0000 mg | ORAL_TABLET | Freq: Three times a day (TID) | ORAL | 2 refills | Status: AC

## 2017-08-02 MED ORDER — BISACODYL 5 MG PO TBEC: 5.0000 mg | DELAYED_RELEASE_TABLET | Freq: Every day | ORAL | Status: DC | PRN

## 2017-08-02 MED ORDER — TRAMADOL HCL 50 MG PO TABS
50.0000 mg | ORAL_TABLET | Freq: Two times a day (BID) | ORAL | Status: DC | PRN
  Administered 2017-08-04 – 2017-08-06 (×2): 50 mg via ORAL
  Filled 2017-08-02 (×2): qty 1

## 2017-08-02 MED ORDER — MIDODRINE HCL 5 MG PO TABS
15.0000 mg | ORAL_TABLET | Freq: Three times a day (TID) | ORAL | Status: DC
  Administered 2017-08-02 – 2017-08-11 (×26): 15 mg via ORAL
  Filled 2017-08-02 (×30): qty 3

## 2017-08-02 MED ORDER — PIPERACILLIN-TAZOBACTAM 3.375 G IVPB 30 MIN
3.3750 g | Freq: Once | INTRAVENOUS | Status: AC
  Administered 2017-08-02: 3.375 g via INTRAVENOUS
  Filled 2017-08-02: qty 50

## 2017-08-02 MED ORDER — POLYETHYLENE GLYCOL 3350 17 G PO PACK: 1.0000 | PACK | Freq: Every day | ORAL | Status: DC | PRN

## 2017-08-02 MED ORDER — HYDROCODONE-ACETAMINOPHEN 5-325 MG PO TABS
1.0000 | ORAL_TABLET | ORAL | Status: DC | PRN
  Administered 2017-08-06: 2 via ORAL
  Administered 2017-08-07 – 2017-08-08 (×2): 1 via ORAL
  Filled 2017-08-02 (×2): qty 1
  Filled 2017-08-02: qty 2

## 2017-08-02 MED ORDER — ONDANSETRON HCL 4 MG/2ML IJ SOLN: 4.0000 mg | Freq: Four times a day (QID) | INTRAMUSCULAR | Status: DC | PRN

## 2017-08-02 MED ORDER — WARFARIN SODIUM 4 MG PO TABS: 4.0000 mg | ORAL_TABLET | Freq: Every day | ORAL | 1 refills | Status: DC

## 2017-08-02 MED ORDER — VANCOMYCIN HCL IN DEXTROSE 1-5 GM/200ML-% IV SOLN
1000.0000 mg | Freq: Once | INTRAVENOUS | Status: AC
  Administered 2017-08-02: 1000 mg via INTRAVENOUS
  Filled 2017-08-02: qty 200

## 2017-08-02 MED ORDER — ONDANSETRON HCL 4 MG PO TABS: 4.0000 mg | ORAL_TABLET | Freq: Four times a day (QID) | ORAL | Status: DC | PRN

## 2017-08-02 MED ORDER — ADULT MULTIVITAMIN W/MINERALS CH
1.0000 | ORAL_TABLET | Freq: Every day | ORAL | Status: DC
  Administered 2017-08-03 – 2017-08-11 (×9): 1 via ORAL
  Filled 2017-08-02 (×9): qty 1

## 2017-08-02 MED ORDER — CALCIUM ACETATE (PHOS BINDER) 667 MG PO CAPS: 2001.0000 mg | ORAL_CAPSULE | Freq: Three times a day (TID) | ORAL | 2 refills | Status: DC

## 2017-08-02 MED ORDER — WARFARIN SODIUM 4 MG PO TABS
4.0000 mg | ORAL_TABLET | Freq: Every day | ORAL | Status: DC
  Administered 2017-08-03 – 2017-08-05 (×3): 4 mg via ORAL
  Filled 2017-08-02 (×4): qty 1

## 2017-08-02 MED ORDER — CINACALCET HCL 30 MG PO TABS
60.0000 mg | ORAL_TABLET | Freq: Every day | ORAL | Status: DC
  Administered 2017-08-03 – 2017-08-04 (×2): 60 mg via ORAL
  Filled 2017-08-02 (×4): qty 2

## 2017-08-02 MED ORDER — ALPRAZOLAM 1 MG PO TABS
2.0000 mg | ORAL_TABLET | Freq: Every evening | ORAL | Status: DC | PRN
  Administered 2017-08-04 – 2017-08-07 (×3): 2 mg via ORAL
  Filled 2017-08-02 (×3): qty 2

## 2017-08-02 MED ORDER — CALCIUM ACETATE (PHOS BINDER) 667 MG PO CAPS
2001.0000 mg | ORAL_CAPSULE | Freq: Three times a day (TID) | ORAL | Status: DC
  Administered 2017-08-03 – 2017-08-11 (×16): 2001 mg via ORAL
  Filled 2017-08-02 (×18): qty 3

## 2017-08-02 MED ORDER — VANCOMYCIN HCL IN DEXTROSE 1-5 GM/200ML-% IV SOLN
1000.0000 mg | Freq: Once | INTRAVENOUS | Status: AC
  Administered 2017-08-03: 1000 mg via INTRAVENOUS
  Filled 2017-08-02: qty 200

## 2017-08-02 MED ORDER — SEVELAMER CARBONATE 800 MG PO TABS: 3200.0000 mg | ORAL_TABLET | Freq: Three times a day (TID) | ORAL | 0 refills | Status: DC

## 2017-08-02 MED ORDER — LORATADINE 10 MG PO TABS: 10.0000 mg | ORAL_TABLET | Freq: Every day | ORAL | 0 refills | Status: DC

## 2017-08-02 MED ORDER — CINACALCET HCL 60 MG PO TABS: 60.0000 mg | ORAL_TABLET | Freq: Every day | ORAL | 0 refills | Status: AC

## 2017-08-02 MED ORDER — DOCUSATE SODIUM 100 MG PO CAPS
100.0000 mg | ORAL_CAPSULE | Freq: Two times a day (BID) | ORAL | Status: DC
  Administered 2017-08-03 – 2017-08-11 (×15): 100 mg via ORAL
  Filled 2017-08-02 (×15): qty 1

## 2017-08-02 MED ORDER — SEVELAMER CARBONATE 800 MG PO TABS: 3200.0000 mg | ORAL_TABLET | Freq: Three times a day (TID) | ORAL | Status: DC

## 2017-08-02 MED ORDER — ACETAMINOPHEN 325 MG PO TABS
650.0000 mg | ORAL_TABLET | Freq: Four times a day (QID) | ORAL | Status: DC | PRN
  Administered 2017-08-09 – 2017-08-11 (×3): 650 mg via ORAL
  Filled 2017-08-02 (×3): qty 2

## 2017-08-02 MED ORDER — GABAPENTIN 300 MG PO CAPS
300.0000 mg | ORAL_CAPSULE | Freq: Every day | ORAL | Status: DC
  Administered 2017-08-03 – 2017-08-07 (×5): 300 mg via ORAL
  Filled 2017-08-02 (×6): qty 1

## 2017-08-02 MED ORDER — VANCOMYCIN HCL IN DEXTROSE 1-5 GM/200ML-% IV SOLN
INTRAVENOUS | Status: AC
  Filled 2017-08-02: qty 200

## 2017-08-02 MED ORDER — PANTOPRAZOLE SODIUM 20 MG PO TBEC
20.0000 mg | DELAYED_RELEASE_TABLET | Freq: Every day | ORAL | Status: DC
  Filled 2017-08-02: qty 1

## 2017-08-02 MED ORDER — LORATADINE 10 MG PO TABS
10.0000 mg | ORAL_TABLET | Freq: Every day | ORAL | Status: DC
  Administered 2017-08-03 – 2017-08-11 (×9): 10 mg via ORAL
  Filled 2017-08-02 (×9): qty 1

## 2017-08-02 MED ORDER — WARFARIN SODIUM 4 MG PO TABS
4.0000 mg | ORAL_TABLET | Freq: Once | ORAL | Status: AC
  Administered 2017-08-02: 4 mg via ORAL
  Filled 2017-08-02: qty 1

## 2017-08-02 MED ORDER — ACETAMINOPHEN 650 MG RE SUPP: 650.0000 mg | Freq: Four times a day (QID) | RECTAL | Status: DC | PRN

## 2017-08-02 MED ORDER — PIPERACILLIN-TAZOBACTAM 3.375 G IVPB
3.3750 g | Freq: Two times a day (BID) | INTRAVENOUS | Status: DC
  Administered 2017-08-03: 3.375 g via INTRAVENOUS
  Filled 2017-08-02: qty 50

## 2017-08-02 MED ORDER — MODAFINIL 100 MG PO TABS: 100.0000 mg | ORAL_TABLET | Freq: Every day | ORAL | 0 refills | Status: DC
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
## 2017-08-02 NOTE — Progress Notes (Signed)
CODE SEPSIS - PHARMACY COMMUNICATION  **Broad Spectrum Antibiotics should be administered within 1 hour of Sepsis diagnosis**  Time Code Sepsis Called/Page Received: 2008  Antibiotics Ordered: Vanc/zosyn  Time of 1st antibiotic administration: 2027  Additional action taken by pharmacy:   If necessary, Name of Provider/Nurse Contacted:     Thomasene Rippleavid  Deshannon Seide ,PharmD Clinical Pharmacist  08/02/2017  9:47 PM

## 2017-08-02 NOTE — Progress Notes (Signed)
[facility]  ROUNDING NOTE   Subjective:   Hemodialysis treatment yesterday. Tolerated treatment well. UF of  Transferred to chair with Michiel Sites lift  Patient laying in bed. Food in mouth.   Objective:  Vital signs in last 24 hours:  Temp:  [97.4 F (36.3 C)-99.5 F (37.5 C)] 97.4 F (36.3 C) (07/17 1229) Pulse Rate:  [76-98] 98 (07/17 1229) Resp:  [16-20] 17 (07/17 1229) BP: (69-122)/(18-37) 104/34 (07/17 1229) SpO2:  [90 %-99 %] 93 % (07/17 1229)  Weight change:  Filed Weights   07/28/17 1420 07/31/17 0945  Weight: 97.5 kg (214 lb 14.4 oz) 97.4 kg (214 lb 11.7 oz)    Intake/Output: I/O last 3 completed shifts: In: -  Out: 700 [Other:700]   Intake/Output this shift:  No intake/output data recorded.  Physical Exam: General: No acute distress, seated in chair  Head: Normocephalic, atraumatic. Moist oral mucosal membranes  Eyes: Anicteric  Neck: Supple, trachea midline  Lungs:  clear  Heart: S1S2 no rubs  Abdomen:  Soft, nontender, bowel sounds present  Extremities: No bilateral lower extremity edema  Neurologic: Responds to verbal stimuli  Skin: No rashes  Access: L IJ permcath    Basic Metabolic Panel: Recent Labs  Lab 07/26/17 1542 07/27/17 1403 07/28/17 1606 08/02/17 0411  NA 142 141  --   --   K 3.4* 3.7  --   --   CL 101 97*  --   --   CO2 30 28  --   --   GLUCOSE 123* 99  --   --   BUN 25* 30*  --   --   CREATININE 7.49* 7.98*  --   --   CALCIUM 9.8 9.9  --   --   PHOS  --  4.4 3.7 2.1*    Liver Function Tests: Recent Labs  Lab 07/27/17 1403  ALBUMIN 3.8   No results for input(s): LIPASE, AMYLASE in the last 168 hours. No results for input(s): AMMONIA in the last 168 hours.  CBC: Recent Labs  Lab 07/26/17 1542 07/27/17 1541 07/30/17 0650 08/02/17 0411  WBC 4.4 3.8 4.4 4.3  NEUTROABS 3.6  --   --   --   HGB 12.2* 12.4* 11.7* 12.4*  HCT 39.3* 40.1 37.2* 39.2*  MCV 88.0 88.8 88.6 89.2  PLT 231 231 236 215     Cardiac Enzymes: No results for input(s): CKTOTAL, CKMB, CKMBINDEX, TROPONINI in the last 168 hours.  BNP: Invalid input(s): POCBNP  CBG: No results for input(s): GLUCAP in the last 168 hours.  Microbiology: Results for orders placed or performed during the hospital encounter of 07/25/17  MRSA PCR Screening     Status: Abnormal   Collection Time: 07/27/17  5:52 PM  Result Value Ref Range Status   MRSA by PCR POSITIVE (A) NEGATIVE Final    Comment:        The GeneXpert MRSA Assay (FDA approved for NASAL specimens only), is one component of a comprehensive MRSA colonization surveillance program. It is not intended to diagnose MRSA infection nor to guide or monitor treatment for MRSA infections. RESULT CALLED TO, READ BACK BY AND VERIFIED WITH: VIVIAN ALI ON 07/27/17 AT 2058 [facility] Performed at [facility], [address] [place], [place] [address]     Coagulation Studies: Recent Labs    07/31/17 6045 08/01/17 0515 08/02/17 0411  LABPROT 28.3* 25.1* 25.1*  INR 2.68 2.30 2.30    Urinalysis: No results for input(s): COLORURINE, LABSPEC,  PHURINE, GLUCOSEU, HGBUR, BILIRUBINUR, KETONESUR, PROTEINUR, UROBILINOGEN, NITRITE, LEUKOCYTESUR in the last 72 hours.  Invalid input(s): APPERANCEUR    Imaging: No results found.   Medications:    . calcium acetate  2,001 mg Oral TID WC  . Chlorhexidine Gluconate Cloth  6 each Topical Q0600  . cinacalcet  60 mg Oral QHS  . escitalopram  20 mg Oral QHS  . feeding supplement (NEPRO CARB STEADY)  237 mL Oral BID BM  . gabapentin  300 mg Oral QHS  . loratadine  10 mg Oral Daily  . midodrine  15 mg Oral TID WC  . modafinil  100 mg Oral Daily  . multivitamin  1 tablet Oral QHS  . multivitamin with minerals  1 tablet Oral Daily  . pantoprazole  40 mg Oral Daily  . sevelamer carbonate  3,200 mg Oral TID WC  . warfarin  4 mg Oral ONCE-1800  . Warfarin - Pharmacist Dosing Inpatient   Does not apply q1800    acetaminophen **OR** acetaminophen, alprazolam, bisacodyl, diphenhydrAMINE, lidocaine-prilocaine, ondansetron **OR** ondansetron (ZOFRAN) IV, polyethylene glycol, RESOURCE THICKENUP CLEAR, traMADol  Assessment/ Plan:  47 y.o. black male with End-stage renal disease on hemodialysis MWF [facility], chronic back pain, GERD, hypertension, seizure disorder, obstructive sleep apnea, anemia chronic kidney disease, secondary hyperparathyroidism, anxiety who is now admitted for [facility].   1.  ESRD on HD : seen and examined on hemodialysis. Tolerating treatment well.  - [facility] - Will be living at home with his mother  2.  Anemia of chronic kidney disease.   No indication for ESA  3.  Secondary hyperparathyroidism.  Phosphorus and calcium at goal.  PTH low at 132 - sevelamer and calcium acetate with meals - cinacalcet  4.  Hypotension.   - midodrine 15 mg p.o. every 8 hours.   LOS: 6 Taggert Bozzi 7/17/201912:59 PM

## 2017-08-02 NOTE — Progress Notes (Signed)
ANTICOAGULATION CONSULT NOTE  Pharmacy Consult for Warfarin Indication: CVA/AF?  Allergies  Allergen Reactions  . Depakote [Divalproex Sodium] Other (See Comments)    Hallucinations     Patient Measurements: Height: 5\' 6"  (167.6 cm) Weight: (unable to weigh; pt cannot stand; hoyer lift) IBW/kg (Calculated) : 63.8  Vital Signs: Temp: 98 F (36.7 C) (07/17 0610) Temp Source: Oral (07/17 0610) BP: 122/34 (07/17 0610) Pulse Rate: 96 (07/17 0610)  Labs: Recent Labs    07/31/17 0608 08/01/17 0515 08/02/17 0411  LABPROT 28.3* 25.1* 25.1*  INR 2.68 2.30 2.30    Estimated Creatinine Clearance: 12.6 mL/min (A) (by C-G formula based on SCr of 7.98 mg/dL (H)).   Assessment: 47 y/o M with a h/o CVA, ?AF on chronic warfarin. PTA dosing of 4 alternating with 4.5 mg warfarin daily.   Goal of Therapy:  INR 2-3  Date  INR  Coumadin dose   7/10  3.02  4 mg 7/11  2.89  4 mg 7/12  3.33  HOLD 7/13  2.93  3 mg 7/14  3.01  HOLD 7/15                 2.68                 3 mg 7/16  2.30  3 mg 7/17  2.30    Plan: INR therapeutic but has been trending down for 3-4 days. Will give trial of home dose of 4 mg tonight and f/u AM INR.  Will add on CBC for today per policy (per lab they have blood sample).  Crist FatHannah Perry [name], PharmD, BCPS Clinical Pharmacist 08/02/2017 7:38 AM

## 2017-08-02 NOTE — Progress Notes (Signed)
PT just left via EMS. Mother is aware.

## 2017-08-02 NOTE — Discharge Summary (Signed)
[facility] [place] [facility]  Perry [name], [place]47 y.o., DOB 1970-11-13, MRN [id number]. Admission date: 07/25/2017 Discharge Date 08/02/2017 Primary MD Perry [name], Perry T, MD Admitting Physician Perry FinnerSona Amena Dockham, MD  Admission Diagnosis  Physical deconditioning [R53.81] Encounter for examination for admission to nursing home [Z02.2]  Discharge Diagnosis   Active Problems:  ESRD (end stage renal disease) (HCC) Essential hypertension History of CVA GERD Left upper extremity swelling due to combination of stroke and thrombus on chronic Coumadin therapy     Hospital Course Perry [name], hypertension, end-stage renal disease on hemodialysis, sleep apnea, history of seizure has been on dialysis since 2011 comes to the emergency room brought in by mother after [facility] and has no dialysis chair as outpatient along with patient's family not able to care for him at home.  [facility] the [facility].  Patient was admitted set up outpatient dialysis.  Also family did not outside of [facility].  And patient was not accepted by any facility here due to his previous history.  Therefore mother wants to take him home.              Consults nephrology Significant Tests:  See full reports for all details    [place] Venous Img Upper Uni Left  Result Date: 07/29/2017 CLINICAL DATA:  Left upper extremity edema and occluded left arm dialysis graft. EXAM: LEFT UPPER EXTREMITY VENOUS DOPPLER ULTRASOUND TECHNIQUE: Gray-scale sonography with graded compression, as well as color Doppler and duplex ultrasound were performed to evaluate the upper extremity deep venous system from the level of the subclavian vein and including the jugular, axillary, basilic, radial, ulnar and upper cephalic vein. Spectral  Doppler was utilized to evaluate flow at rest and with distal augmentation maneuvers. COMPARISON:  None. FINDINGS: Contralateral Subclavian Vein: Respiratory phasicity is normal and symmetric with the symptomatic side. No evidence of thrombus. Normal compressibility. Internal Jugular Vein: There is some nonocclusive thrombus visualized in the left internal jugular vein. This may relate to prior catheter placement. Subclavian Vein: No evidence of thrombus. Normal compressibility, respiratory phasicity and response to augmentation. Axillary Vein: No evidence of thrombus. Normal compressibility, respiratory phasicity and response to augmentation. Cephalic Vein: No evidence of thrombus. Normal compressibility, respiratory phasicity and response to augmentation. Basilic Vein: No evidence of thrombus. Normal compressibility, respiratory phasicity and response to augmentation. Brachial Veins: 1 of 2 visualized deep brachial veins in the left upper arm shows evidence of thrombus. Another brachial vein appears widely patent. Radial Veins: No evidence of thrombus. Normal compressibility, respiratory phasicity and response to augmentation. Ulnar Veins: No evidence of thrombus. Normal compressibility, respiratory phasicity and response to augmentation. Venous Reflux:  None visualized. Other Findings: Occluded dialysis graft versus fistula in the left arm. IMPRESSION: Thrombus in 1 of 2 paired deep brachial veins in the left upper arm. Nonocclusive thrombus also identified in the left internal jugular vein which may be related to an indwelling dialysis catheter. Electronically Signed   By: Irish LackGlenn  Yamagata M.D.   On: 07/29/2017 11:15       Today   Subjective:   Perry [name] patient remains same Objective:   Blood pressure (!) 104/34, pulse 98, temperature (!) 97.4 F (36.3 C), temperature source Oral, resp. rate 17, height 5\' 6"  (1.676 m), weight 97.4 kg (214 lb 11.7 oz), SpO2 93 %.  .[name]  Intake/Output Summary  (Last 24 hours) at 08/02/2017 1525 Last data filed at 08/02/2017 0500 Gross per 24 hour  Intake -  Output 0 ml  Net 0 ml    Exam VITAL SIGNS: Blood pressure (!) 104/34, pulse 98, temperature (!) 97.4 F (36.3 C), temperature source Oral, resp. rate 17, height 5\' 6"  (1.676 m), weight 97.4 kg (214 lb 11.7 oz), SpO2 93 %.  GENERAL:  47 y.o.-year-old patient lying in the bed with no acute distress.  EYES: Pupils equal, round, reactive to light and accommodation. No scleral icterus. Extraocular muscles intact.  HEENT: Head atraumatic, normocephalic. Oropharynx and nasopharynx clear.  NECK:  Supple, no jugular venous distention. No thyroid enlargement, no tenderness.  LUNGS: Normal breath sounds bilaterally, no wheezing, rales,rhonchi or crepitation. No use of accessory muscles of respiration.  CARDIOVASCULAR: S1, S2 normal. No murmurs, rubs, or gallops.  ABDOMEN: Soft, nontender, nondistended. Bowel sounds present. No organomegaly or mass.  EXTREMITIES: No pedal edema, cyanosis, or clubbing.  NEUROLOGIC: Left upper extremity left lower extremity weakness PSYCHIATRIC: The patient is alert and oriented x 3.  SKIN: No obvious rash, lesion, or ulcer.   Data Review     CBC w Diff:  Lab Results  Component Value Date   WBC 4.3 08/02/2017   HGB 12.4 (L) 08/02/2017   HGB 13.7 05/15/2014   HCT 39.2 (L) 08/02/2017   HCT 43.6 05/15/2014   PLT 215 08/02/2017   PLT 461 (H) 05/15/2014   LYMPHOPCT 10 07/26/2017   LYMPHOPCT 2.4 05/15/2014   MONOPCT 8 07/26/2017   MONOPCT 11.7 05/15/2014   EOSPCT 1 07/26/2017   EOSPCT 0.2 05/15/2014   BASOPCT 1 07/26/2017   BASOPCT 0.2 05/15/2014   CMP:  Lab Results  Component Value Date   NA 141 07/27/2017   NA 135 05/15/2014   K 3.7 07/27/2017   K 5.9 (H) 05/15/2014   CL 97 (L) 07/27/2017   CL 92 (L) 05/15/2014   CO2 28 07/27/2017   CO2 21 (L) 05/15/2014   BUN 30 (H) 07/27/2017   BUN 77 (H) 05/15/2014   CREATININE 7.98 (H) 07/27/2017    CREATININE 12.78 (HH) 07/03/2014   PROT 6.2 (L) 07/25/2017   ALBUMIN 3.8 07/27/2017   BILITOT 0.8 07/25/2017   ALKPHOS 83 07/25/2017   AST 14 (L) 07/25/2017   ALT 15 07/25/2017  .  Micro Results Recent Results (from the past 240 hour(s))  MRSA PCR Screening     Status: Abnormal   Collection Time: 07/27/17  5:52 PM  Result Value Ref Range Status   MRSA by PCR POSITIVE (A) NEGATIVE Final    Comment:        The GeneXpert MRSA Assay (FDA approved for NASAL specimens only), is one component of a comprehensive MRSA colonization surveillance program. It is not intended to diagnose MRSA infection nor to guide or monitor treatment for MRSA infections. RESULT CALLED TO, READ BACK BY AND VERIFIED WITH: VIVIAN ALI ON 07/27/17 AT 2058 [facility] Performed at [facility], [address] [place], [place] [address]         Code Status Orders  (From admission, onward)        Start     Ordered   07/27/17 1250  Full code  Continuous     07/27/17 1249    Code Status History    Date Active Date Inactive Code Status Order ID Comments User Context   07/11/2016 2158 07/12/2016 1509 Full Code [id number]  Perry Finner, MD Inpatient  05/06/2015 1126 05/08/2015 1456 Full Code [id number]  Erick Blinks, MD Inpatient   08/07/2014 1858 08/11/2014 1805 Full Code [id number]  Ardith Dark, MD Inpatient   05/05/2014 1632 05/07/2014 1405 Full Code [id number]  Luretha Murphy, MD Inpatient   11/16/2013 2301 11/17/2013 1724 Full Code [id number]  Levie Heritage, DO Inpatient          Follow-up Information    Perry Brooks, MD. Go on 08/07/2017.   Specialty:  Family Medicine Why:  at 12:15 Contact information: [address] [place] [address] [phone]           Discharge Medications   Allergies as of 08/02/2017      Reactions   Depakote [divalproex Sodium] Other (See Comments)   Hallucinations      Medication List    STOP taking these medications    diphenhydrAMINE 25 mg capsule Commonly known as:  BENADRYL     TAKE these medications   acetaminophen 325 MG tablet Commonly known as:  TYLENOL Take 650 mg by mouth every 6 (six) hours as needed for mild pain or moderate pain.   alprazolam 2 MG tablet Commonly known as:  XANAX TAKE ONE TABLET BY MOUTH ONCE DAILY AT BEDTIME AS NEEDED - MUST  BE  30  DAYS  BETWEEN  REFILLS   calcium acetate 667 MG capsule Commonly known as:  PHOSLO Take 3 capsules (2,001 mg total) by mouth 3 (three) times daily with meals.   cinacalcet 60 MG tablet Commonly known as:  SENSIPAR Take 1 tablet (60 mg total) by mouth at bedtime.   escitalopram 20 MG tablet Commonly known as:  LEXAPRO Take 1 tablet (20 mg total) by mouth at bedtime.   gabapentin 300 MG capsule Commonly known as:  NEURONTIN Take 1 capsule (300 mg total) by mouth at bedtime.   lidocaine-prilocaine cream Commonly known as:  EMLA Apply 1 application topically as needed.   loratadine 10 MG tablet Commonly known as:  CLARITIN Take 1 tablet (10 mg total) by mouth daily.   midodrine 10 MG tablet Commonly known as:  PROAMATINE Take 1.5 tablets (15 mg total) by mouth every 8 (eight) hours.   modafinil 100 MG tablet Commonly known as:  PROVIGIL Take 1 tablet (100 mg total) by mouth daily. Start taking on:  08/03/2017   multivitamin with minerals tablet Take 1 tablet by mouth daily.   pantoprazole 20 MG tablet Commonly known as:  PROTONIX Take 1 tablet (20 mg total) by mouth daily.   polyethylene glycol packet Commonly known as:  MIRALAX / GLYCOLAX Take 17 g by mouth daily as needed for mild constipation.   sevelamer carbonate 800 MG tablet Commonly known as:  RENVELA Take 4 tablets (3,200 mg total) by mouth 3 (three) times daily with meals.   traMADol 50 MG tablet Commonly known as:  ULTRAM Take 1 tablet (50 mg total) by mouth every 12 (twelve) hours as needed for moderate pain.   warfarin 4 MG tablet Commonly known  as:  COUMADIN Take 1 tablet (4 mg total) by mouth daily. What changed:    medication strength  how much to take  when to take this  additional instructions            Durable Medical Equipment  (From admission, onward)        Start     Ordered   08/01/17 1651  For home use only DME standard manual wheelchair with seat cushion  Once  08/01/17 1651   07/26/17 1545  For home use only DME Hospital bed  Once    Comments:  Patient suffers from chronic back pain, CVA 03/2017, dialysis patient, fx of transverse process of T1-9, seizures, CHF, morbid obesity. A hospital bed will alleviate pain by allowing upper and lower body to be positioned in ways not feasible with a normal bed. Pain episodes frequently require immediate changes in body position which cannot be achieved with normal bed.  Question Answer Comment  Head must be elevated greater than: 30 degrees   Bed type Semi-electric   Hoyer Lift Yes   Trapeze Bar Yes   Support Surface: Gel Overlay      07/26/17 1545   07/26/17 1544  For home use only DME lightweight manual wheelchair with seat cushion  Once    Comments:  Wheelchair requested from [place] [facility].  Patient suffers from chronic back pain, CVA 03/2017, dialysis patient, fx of transverse process of T1-9, seizures, CHF, morbid obesity which impairs their ability to perform daily activities like toileting, feeding, dressing, grooming, and bathing in the home.  A cane, walker, or crutch will not resolve issue with performing activities of daily living.  A wheelchair will allow patient to safely perform daily activities.  Patient can safely propel the wheelchair in the home or has a caregiver who can provide assistance.  Accessories: elevating leg rests (ELRs), wheel locks, extensions and anti-tippers.   07/26/17 1544   07/26/17 0000  DME Hospital bed    Question Answer Comment  Patient has (list medical condition): deconditioning, generalized weakness,  ambulatory dysfunction   The above medical condition requires: Patient requires the ability to reposition frequently   Head must be elevated greater than: 30 degrees   Bed type Semi-electric   Hoyer Lift Yes      07/26/17 1433   07/26/17 0000  DME Hospital bed    Question Answer Comment  The above medical condition requires: Patient requires the ability to reposition frequently   Head must be elevated greater than: 30 degrees   Bed type Semi-electric   Hoyer Lift Yes      07/26/17 1438         Total Time in preparing paper work, data evaluation and todays exam - 35 minutes  Auburn Bilberry M.D on 08/02/2017 at 3:25 PM [facility]   Office  [phone]

## 2017-08-02 NOTE — Progress Notes (Signed)
Mother is aware of D/C. Per Mother, equipment was delivered to home address. EMS called and will be here at 1830 to pick up pt to transport home. Mother will be here at [id number] and will follow EMS home to let pt in the house.
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
## 2017-08-02 NOTE — ED Triage Notes (Signed)
Pt arrived via ems from inpt room 210. Pt was being discharged but was hypotention in the ems truck. Pt NAD currently. EDP at bedside.

## 2017-08-02 NOTE — Care Management Note (Signed)
Case Management Note  Patient Details  Name: Perry [name] MRN: [id number] Date of Birth: 12-May-1970   Patient to discharge home today to the care of his mother.  Plan was discussed again with mother yesterday.  Elvera Bicker dialysis liaison notified of discharge.  Malachy Mood with [facility] notified of discharge.  Dr Posey Pronto to sign home health orders until patient sees his PCP on Monday 08/07/17.  Per Corene Cornea with [employer] equipment is scheduled to be delivered between 2-6 pm today.  After equipment delivery is confirmed EMS transport can be arranged.  EMS forms have been placed on chart.  Elvera Bicker HD liaison met with mother yesterday and discussed ACTA as well as no emergent transportation to and from HD after patient is discharge.  Patient does not qualify for home O2.  Message was left for mother at 2:40 pm notifying her that she needs to contact the bedside nurse on the department [phone]) and let her know when the equipment has been delivered.   Subjective/Objective:                    Action/Plan:   Expected Discharge Date:  08/02/17               Expected Discharge Plan:  [place]  In-House Referral:     Discharge planning Services  CM Consult  Post Acute Care Choice:  Durable Medical Equipment, Home Health Choice offered to:  Patient  DME Arranged:  Hospital bed, Wheelchair manual(lift) DME Agency:  [place]:  RN, PT, Nurse's Aide, Social Work, Theme park manager Therapy HH Agency:  [facility]  Status of Service:  Completed, signed off  If discussed at [employer], dates discussed:    Additional Comments:  Beverly Sessions, RN 08/02/2017, 2:38 PM

## 2017-08-03 LAB — BASIC METABOLIC PANEL
Anion gap: 7 (ref 5–15)
BUN: 24 mg/dL — AB (ref 6–20)
CO2: 31 mmol/L (ref 22–32)
Calcium: 8.6 mg/dL — ABNORMAL LOW (ref 8.9–10.3)
Chloride: 104 mmol/L (ref 98–111)
Creatinine, Ser: 6.47 mg/dL — ABNORMAL HIGH (ref 0.61–1.24)
GFR calc Af Amer: 11 mL/min — ABNORMAL LOW (ref >60)
GFR, EST NON AFRICAN AMERICAN: 9 mL/min — AB (ref >60)
GLUCOSE: 92 mg/dL (ref 70–99)
POTASSIUM: 3.6 mmol/L (ref 3.5–5.1)
Sodium: 142 mmol/L (ref 135–145)

## 2017-08-03 LAB — PROTIME-INR
INR: 2.01
Prothrombin Time: 22.6 seconds — ABNORMAL HIGH (ref 11.4–15.2)

## 2017-08-03 LAB — TROPONIN I: Troponin I: 0.03 ng/mL (ref <0.03)

## 2017-08-03 LAB — CBC
HCT: 39 % — ABNORMAL LOW (ref 40.0–52.0)
Hemoglobin: 12.4 g/dL — ABNORMAL LOW (ref 13.0–18.0)
MCH: 28.1 pg (ref 26.0–34.0)
MCHC: 31.7 g/dL — AB (ref 32.0–36.0)
MCV: 88.8 fL (ref 80.0–100.0)
Platelets: 218 10*3/uL (ref 150–440)
RBC: 4.39 MIL/uL — ABNORMAL LOW (ref 4.40–5.90)
RDW: 20.1 % — AB (ref 11.5–14.5)
WBC: 3.6 10*3/uL — ABNORMAL LOW (ref 3.8–10.6)

## 2017-08-03 LAB — GLUCOSE, CAPILLARY: GLUCOSE-CAPILLARY: 97 mg/dL (ref 70–99)

## 2017-08-03 LAB — LACTIC ACID, PLASMA: Lactic Acid, Venous: 1.4 mmol/L (ref 0.5–1.9)

## 2017-08-03 MED ORDER — VANCOMYCIN HCL IN DEXTROSE 1-5 GM/200ML-% IV SOLN: 1000.0000 mg | INTRAVENOUS | Status: DC

## 2017-08-03 MED ORDER — CHLORHEXIDINE GLUCONATE CLOTH 2 % EX PADS
6.0000 | MEDICATED_PAD | Freq: Every day | CUTANEOUS | Status: DC
  Administered 2017-08-04 – 2017-08-11 (×3): 6 via TOPICAL

## 2017-08-03 MED ORDER — PANTOPRAZOLE SODIUM 40 MG PO TBEC
40.0000 mg | DELAYED_RELEASE_TABLET | Freq: Every day | ORAL | Status: DC
Start: 2017-08-03 — End: 2017-08-11
  Administered 2017-08-03 – 2017-08-11 (×9): 40 mg via ORAL
  Filled 2017-08-03 (×9): qty 1

## 2017-08-03 MED ORDER — AMPICILLIN-SULBACTAM SODIUM 3 (2-1) G IJ SOLR
3.0000 g | INTRAMUSCULAR | Status: DC
Start: 2017-08-03 — End: 2017-08-05
  Administered 2017-08-03 – 2017-08-04 (×2): 3 g via INTRAVENOUS
  Filled 2017-08-03 (×3): qty 3

## 2017-08-03 NOTE — Progress Notes (Signed)
[facility]  ROUNDING NOTE   Subjective:   Readmitted due to hypotension.   Last hemodialysis was Tuesday.    Objective:  Vital signs in last 24 hours:  Temp:  [97.6 F (36.4 C)-98.5 F (36.9 C)] 97.8 F (36.6 C) (07/18 1220) Pulse Rate:  [78-95] 83 (07/18 1222) Resp:  [14-24] 16 (07/18 1220) BP: (59-127)/(25-45) 107/37 (07/18 1222) SpO2:  [91 %-100 %] 100 % (07/18 1220) Weight:  [99.5 kg (219 lb 5.7 oz)] 99.5 kg (219 lb 5.7 oz) (07/17 1945)  Weight change:  Filed Weights   08/02/17 1945  Weight: 99.5 kg (219 lb 5.7 oz)    Intake/Output: No intake/output data recorded.   Intake/Output this shift:  Total I/O In: 30 [P.O.:30] Out: -   Physical Exam: General: No acute distress, seated in chair  Head: Normocephalic, atraumatic. Moist oral mucosal membranes  Eyes: Anicteric  Neck: Supple, trachea midline  Lungs:  clear  Heart: S1S2 no rubs  Abdomen:  Soft, nontender, bowel sounds present  Extremities: No bilateral lower extremity edema  Neurologic: Responds to verbal stimuli  Skin: No rashes  Access: L IJ permcath    Basic Metabolic Panel: Recent Labs  Lab 07/28/17 1606 08/02/17 0411 08/02/17 1955 08/03/17 0422  NA  --   --  143 142  K  --   --  4.4 3.6  CL  --   --  102 104  CO2  --   --  32 31  GLUCOSE  --   --  98 92  BUN  --   --  23* 24*  CREATININE  --   --  6.11* 6.47*  CALCIUM  --   --  8.6* 8.6*  PHOS 3.7 2.1*  --   --     Liver Function Tests: Recent Labs  Lab 08/02/17 1955  AST 25  ALT 33  ALKPHOS 85  BILITOT 0.9  PROT 6.0*  ALBUMIN 2.9*   No results for input(s): LIPASE, AMYLASE in the last 168 hours. No results for input(s): AMMONIA in the last 168 hours.  CBC: Recent Labs  Lab 07/27/17 1541 07/30/17 0650 08/02/17 0411 08/02/17 1955 08/03/17 0422  WBC 3.8 4.4 4.3 4.1 3.6*  NEUTROABS  --   --   --  2.7  --   HGB 12.4* 11.7* 12.4* 12.6* 12.4*  HCT 40.1 37.2* 39.2* 40.5 39.0*  MCV 88.8 88.6 89.2 88.3 88.8   PLT 231 236 215 229 218    Cardiac Enzymes: Recent Labs  Lab 08/02/17 1955 08/03/17 0422  TROPONINI 0.03* 0.03*    BNP: Invalid input(s): POCBNP  CBG: Recent Labs  Lab 08/03/17 0739  GLUCAP 97    Microbiology: Results for orders placed or performed during the hospital encounter of 08/02/17  Culture, blood (Routine x 2)     Status: None (Preliminary result)   Collection Time: 08/02/17  7:55 PM  Result Value Ref Range Status   Specimen Description BLOOD BLOOD RIGHT ARM  Final   Special Requests   Final    BOTTLES DRAWN AEROBIC AND ANAEROBIC Blood Culture adequate volume   Culture   Final    NO GROWTH < 12 HOURS Performed at [facility], [address] [place], [place] [address]    Report Status PENDING  Incomplete  Culture, blood (Routine x 2)     Status: None (Preliminary result)   Collection Time: 08/02/17  7:57 PM  Result Value Ref Range Status   Specimen Description BLOOD BLOOD  RIGHT FOREARM  Final   Special Requests   Final    BOTTLES DRAWN AEROBIC AND ANAEROBIC Blood Culture adequate volume   Culture   Final    NO GROWTH < 12 HOURS Performed at [facility], [address] [place], [place] [address]    Report Status PENDING  Incomplete    Coagulation Studies: Recent Labs    08/01/17 0515 08/02/17 0411 08/02/17 1955 08/03/17 0422  LABPROT 25.1* 25.1* 23.0* 22.6*  INR 2.30 2.30 2.06 2.01    Urinalysis: No results for input(s): COLORURINE, LABSPEC, PHURINE, GLUCOSEU, HGBUR, BILIRUBINUR, KETONESUR, PROTEINUR, UROBILINOGEN, NITRITE, LEUKOCYTESUR in the last 72 hours.  Invalid input(s): APPERANCEUR    Imaging: Dg Chest Port 1 View  Result Date: 08/02/2017 CLINICAL DATA:  47 year old male with a history of hypotension EXAM: PORTABLE CHEST 1 VIEW COMPARISON:  07/11/2016 FINDINGS: Cardiomediastinal silhouette likely unchanged, partially obscured by overlying lung/pleural disease. New opacity in the right hilar region and right  inferior lung. No pneumothorax. Vascular stents of the right upper extremity. Left IJ split tip hemodialysis catheter in place. IMPRESSION: New airspace opacity of the right hilar region and lower right lung, potentially combination of atelectasis/consolidation, edema, and/or pleural effusion. Vascular stents of the right upper extremity. Left IJ hemodialysis catheter Electronically Signed   By: Gilmer MorJaime  Wagner D.O.   On: 08/02/2017 20:16     Medications:   . piperacillin-tazobactam (ZOSYN)  IV Stopped (08/03/17 1145)  . [START ON 08/05/2017] vancomycin     . calcium acetate  2,001 mg Oral TID WC  . Chlorhexidine Gluconate Cloth  6 each Topical Q0600  . cinacalcet  60 mg Oral QHS  . docusate sodium  100 mg Oral BID  . escitalopram  20 mg Oral QHS  . gabapentin  300 mg Oral QHS  . loratadine  10 mg Oral Daily  . midodrine  15 mg Oral Q8H  . multivitamin with minerals  1 tablet Oral Daily  . pantoprazole  40 mg Oral Daily  . warfarin  4 mg Oral Daily   acetaminophen **OR** acetaminophen, alprazolam, bisacodyl, HYDROcodone-acetaminophen, ondansetron **OR** ondansetron (ZOFRAN) IV, polyethylene glycol, traMADol  Assessment/ Plan:  47 y.o. black male with End-stage renal disease on hemodialysis MWF [facility], chronic back pain, GERD, hypertension, seizure disorder, obstructive sleep apnea, anemia chronic kidney disease, secondary hyperparathyroidism, anxiety who is now admitted for reestablishing [place]pt dialysis [facility].   1.  ESRD on HD :  - [facility] - Will be living at home with his mother however unclear how stable a discharge plan this will be - Dialysis for tomorrow. Keep a MWF schedule until outpatient placement is found.   2.  Anemia of chronic kidney disease.   No indication for ESA  3.  Secondary hyperparathyroidism.  Phosphorus and calcium at goal.  PTH low at 132 - sevelamer and  calcium acetate with meals - cinacalcet  4.  Hypotension.  Unable to take PO medications earlier.  - midodrine 15 mg p.o. every 8 hours.   LOS: 1 Perry [name] 7/18/20192:52 PM

## 2017-08-03 NOTE — Progress Notes (Signed)
Pharmacy Antibiotic Note  Perry [name] is a 47 y.o. male admitted on 08/02/2017 with sepsis.  Pharmacy has been consulted for vanc/zosyn dosing. Patient received vanc 1g IV x 2 and zosyn 3.375g IV x 1. Patient was previously here for CVA and discharged, but then returned without leaving facility for hypotension and new opacity on CXR. Patient is also being switch to a different outpatient dialysis center with a Tuesday, Thursday, Saturday schedule per RN.  Plan: Will continue vanc 1g IV qTThSa w/ hemodialysis  Will give first maintenance vanc 1g  dose at 07/20 on the chances patient is not yet scheduled for dialysis. If dialysis schedule changes, please adjust appropriately. Will draw a pre-HD level 07/23 @ 0500. Goal level < 20 - 25 mcg/mL  Will continue zosyn 3.375g IV q12h per CrCl < 20 ml/min  Height: 5\' 6"  (167.6 cm) Weight: 219 lb 5.7 oz (99.5 kg) IBW/kg (Calculated) : 63.8  Temp (24hrs), Avg:97.7 F (36.5 C), Min:97.4 F (36.3 C), Max:98 F (36.7 C)  Recent Labs  Lab 07/27/17 1403 07/27/17 1541 07/30/17 0650 08/02/17 0411 08/02/17 1955 08/03/17 0014  WBC  --  3.8 4.4 4.3 4.1  --   CREATININE 7.98*  --   --   --  6.11*  --   LATICACIDVEN  --   --   --   --  1.6 1.4    Estimated Creatinine Clearance: 16.7 mL/min (A) (by C-G formula based on SCr of 6.11 mg/dL (H)).    Allergies  Allergen Reactions  . Depakote [Divalproex Sodium] Other (See Comments)    Hallucinations     Thank you for allowing pharmacy to be a part of this patient's care.  Thomasene Rippleavid Brahm Barbeau, PharmD, BCPS Clinical Pharmacist 08/03/2017

## 2017-08-03 NOTE — [place] FL2 (Signed)
[place] MEDICAID FL2 LEVEL OF CARE SCREENING TOOL     IDENTIFICATION  Patient Name: Perry [name] Birthdate: 1970/04/04 Sex: male Admission Date (Current Location): 08/02/2017  [facility] and [place] Number:  Chiropodist and Address:  [facility], [address], [place], [place] [address]      Provider Number: [id number]  Attending Physician Name and Address:  Auburn Bilberry, MD  Relative Name and Phone Number:       Current Level of Care: Hospital Recommended Level of Care: Skilled Nursing Facility Prior Approval Number:    Date Approved/Denied:   PASRR Number:    Discharge Plan: SNF    Current Diagnoses: Patient Active Problem List   Diagnosis Date Noted  . Sepsis (HCC) 08/02/2017  . Solitary pulmonary nodule 05/08/2015  . CHF (congestive heart failure) (HCC) 05/06/2015  . Hyperkalemia 05/06/2015  . Acute encephalopathy 05/06/2015  . OSA on CPAP 05/06/2015  . Morbid obesity (HCC) 05/06/2015  . Fracture of transverse process of thoracic vertebra (HCC)   . 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 Enteritis due to Clostridium difficile   . Hypernatremia   . Convulsion (HCC)   . ESRD (end stage renal disease) (HCC) 08/07/2014  . Altered mental status 08/07/2014  . 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 Epigastric abdominal pain   . Abdominal pain, epigastric 11/17/2013  . Hypotension 11/17/2013  . Abdominal pain 11/16/2013  . Seizures (HCC) 06/06/2012  . ESRD on dialysis (HCC) 06/06/2012  . Anemia of chronic disease 06/06/2012  . Secondary hyperparathyroidism (of renal origin) 06/06/2012  . HTN (hypertension) 06/06/2012  . Arteriovenous fistula occlusion (HCC) 06/06/2012    Orientation RESPIRATION BLADDER Height & Weight     Self, Place, Situation, Time  Normal Incontinent Weight: 219 lb 5.7 oz (99.5 kg) Height:  5\' 6"  (167.6 cm)  BEHAVIORAL SYMPTOMS/MOOD NEUROLOGICAL BOWEL NUTRITION STATUS  (none)  Convulsions/Seizures Incontinent Diet  AMBULATORY STATUS COMMUNICATION OF NEEDS Skin   Total Care Verbally Normal                       Personal Care Assistance Level of Assistance  Total care       Total Care Assistance: Maximum assistance   Functional Limitations Info  (none)          SPECIAL CARE FACTORS FREQUENCY  (outpatient HD)                    Contractures Contractures Info: Not present    Additional Factors Info  Code Status, Allergies Code Status Info: full Allergies Info: depakote           Current Medications (08/03/2017):  This is the current hospital active medication list Current Facility-Administered Medications  Medication Dose Route Frequency Provider Last Rate Last Dose  . acetaminophen (TYLENOL) tablet 650 mg  650 mg Oral Q6H PRN Cammy Copa, MD       Or  . acetaminophen (TYLENOL) suppository 650 mg  650 mg Rectal Q6H PRN Cammy Copa, MD      . ALPRAZolam Prudy Feeler) tablet 2 mg  2 mg Oral QHS PRN Cammy Copa, MD      . bisacodyl (DULCOLAX) EC tablet 5 mg  5 mg Oral Daily PRN Cammy Copa, MD      . calcium acetate (PHOSLO) capsule 2,001 mg  2,001 mg Oral TID WC Cammy Copa, MD   2,001 mg at 08/03/17 0856  . Chlorhexidine Gluconate Cloth 2 % PADS 6  each  6 each Topical Q0600 Kolluru, Sarath, MD      . cinacalcet (SENSIPAR) tablet 60 mg  60 mg Oral QHS Cammy CopaMaier, Angela, MD      . docusate sodium (COLACE) capsule 100 mg  100 mg Oral BID Cammy CopaMaier, Angela, MD   100 mg at 08/03/17 1113  . escitalopram (LEXAPRO) tablet 20 mg  20 mg Oral QHS Cammy CopaMaier, Angela, MD      . gabapentin (NEURONTIN) capsule 300 mg  300 mg Oral QHS Cammy CopaMaier, Angela, MD      . HYDROcodone-acetaminophen (NORCO/VICODIN) 5-325 MG per tablet 1-2 tablet  1-2 tablet Oral Q4H PRN Cammy CopaMaier, Angela, MD      . loratadine (CLARITIN) tablet 10 mg  10 mg Oral Daily Cammy CopaMaier, Angela, MD   10 mg at 08/03/17 1113  . midodrine (PROAMATINE) tablet 15 mg  15 mg Oral Q8H Cammy CopaMaier, Angela, MD   15 mg at  08/02/17 2339  . multivitamin with minerals tablet 1 tablet  1 tablet Oral Daily Cammy CopaMaier, Angela, MD   1 tablet at 08/03/17 1113  . ondansetron (ZOFRAN) tablet 4 mg  4 mg Oral Q6H PRN Cammy CopaMaier, Angela, MD       Or  . ondansetron [facility](ZOFRAN) injection 4 mg  4 mg Intravenous Q6H PRN Cammy CopaMaier, Angela, MD      . pantoprazole (PROTONIX) EC tablet 40 mg  40 mg Oral Daily Cammy CopaMaier, Angela, MD   40 mg at 08/03/17 1114  . piperacillin-tazobactam (ZOSYN) IVPB 3.375 g  3.375 g Intravenous Q12H Cammy CopaMaier, Angela, MD   Stopped at 08/03/17 1145  . polyethylene glycol (MIRALAX / GLYCOLAX) packet 17 g  1 packet Oral Daily PRN Cammy CopaMaier, Angela, MD      . traMADol Janean Sark(ULTRAM) tablet 50 mg  50 mg Oral Q12H PRN Cammy CopaMaier, Angela, MD      . Melene Muller[START ON 08/05/2017] vancomycin (VANCOCIN) IVPB 1000 mg/200 mL premix  1,000 mg Intravenous Q T,Th,Sa-HD Cammy CopaMaier, Angela, MD      . warfarin (COUMADIN) tablet 4 mg  4 mg Oral Daily Cammy CopaMaier, Angela, MD   4 mg at 08/03/17 1114     Discharge Medications: Please see discharge summary for a list of discharge medications.  Relevant Imaging Results:  Relevant Lab Results:   Additional Information ss: [id number]  York SpanielMonica Ermalee Mealy, LCSW

## 2017-08-03 NOTE — Care Management (Signed)
Elnita Maxwellheryl with [facility] notified of readmit

## 2017-08-03 NOTE — Progress Notes (Signed)
[employer] - [place] [facility]                                                                                                                                                                                  Patient Demographics   Perry [name], is a 47 y.o. male, DOB - July 14, 1970, UEA:[id number]  Admit date - 08/02/2017   Admitting Physician Cammy Copa, MD  Outpatient Primary MD for the patient is [facility]   LOS - 1  Subjective: Patient was discharged home and before even he could get home he was brought back with low blood pressure Admitted with sepsis   Review of Systems:   CONSTITUTIONAL: No documented fever. No fatigue, weakness. No weight gain, no weight loss.  EYES: No blurry or double vision.  ENT: No tinnitus. No postnasal drip. No redness of the oropharynx.  RESPIRATORY: No cough, no wheeze, no hemoptysis. No dyspnea.  CARDIOVASCULAR: No chest pain. No orthopnea. No palpitations. No syncope.  GASTROINTESTINAL: No nausea, no vomiting or diarrhea. No abdominal pain. No melena or hematochezia.  GENITOURINARY: No dysuria or hematuria.  ENDOCRINE: No polyuria or nocturia. No heat or cold intolerance.  HEMATOLOGY: No anemia. No bruising. No bleeding.  INTEGUMENTARY: No rashes. No lesions.  MUSCULOSKELETAL: No arthritis. No swelling. No gout.  NEUROLOGIC: No numbness, tingling, or ataxia. No seizure-type activity.  PSYCHIATRIC: No anxiety. No insomnia. No ADD.    Vitals:   Vitals:   08/03/17 0546 08/03/17 0639 08/03/17 1220 08/03/17 1222  BP: (!) 98/33  (!) 102/31 (!) 107/37  Pulse: 78  79 83  Resp: 14  16   Temp:  98.5 F (36.9 C) 97.8 F (36.6 C)   TempSrc:  Oral Oral   SpO2: 100%  100%   Weight:      Height:        Wt Readings from Last 3 Encounters:  08/02/17 99.5 kg (219 lb 5.7 oz)  07/11/16 103 kg (227 lb 1.2 oz)  05/08/15 117.8 kg (259 lb 11.2 oz)     Intake/Output Summary (Last 24 hours) at 08/03/2017  1459 Last data filed at 08/03/2017 0900 Gross per 24 hour  Intake 30 ml  Output 0 ml  Net 30 ml    Physical Exam:   GENERAL: Chronically ill-appearing.  HEAD, EYES, EARS, NOSE AND THROAT: Atraumatic, normocephalic. Extraocular muscles are intact. Pupils equal and reactive to light. Sclerae anicteric. No conjunctival injection. No oro-pharyngeal erythema.  NECK: Supple. There is no jugular venous distention. No bruits, no lymphadenopathy, no thyromegaly.  HEART: Regular rate and rhythm,. No murmurs, no rubs, no clicks.  LUNGS: Clear to auscultation bilaterally. No rales or  rhonchi. No wheezes.  ABDOMEN: Soft, flat, nontender, nondistended. Has good bowel sounds. No hepatosplenomegaly appreciated.  EXTREMITIES: Left upper extremity swelling left-sided paresis NEUROLOGIC: The patient is alert, awake, and oriented x3 with no focal motor or sensory deficits appreciated bilaterally.  SKIN: Moist and warm with no rashes appreciated.  Psych: Not anxious, depressed LN: No inguinal LN enlargement    Antibiotics   Anti-infectives (From admission, onward)   Start     Dose/Rate Route Frequency Ordered Stop   08/05/17 1800  vancomycin (VANCOCIN) IVPB 1000 mg/200 mL premix     1,000 mg 200 mL/hr over 60 Minutes Intravenous Every T-Th-Sa (Hemodialysis) 08/03/17 0146     08/03/17 0800  piperacillin-tazobactam (ZOSYN) IVPB 3.375 g     3.375 g 12.5 mL/hr over 240 Minutes Intravenous Every 12 hours 08/02/17 2148     08/02/17 2145  vancomycin (VANCOCIN) IVPB 1000 mg/200 mL premix     1,000 mg 200 mL/hr over 60 Minutes Intravenous  Once 08/02/17 2143 08/03/17 0410   08/02/17 2015  piperacillin-tazobactam (ZOSYN) IVPB 3.375 g     3.375 g 100 mL/hr over 30 Minutes Intravenous  Once 08/02/17 2007 08/02/17 2123   08/02/17 2015  vancomycin (VANCOCIN) IVPB 1000 mg/200 mL premix     1,000 mg 200 mL/hr over 60 Minutes Intravenous  Once 08/02/17 2007 08/02/17 2240      Medications   Scheduled Meds: .  calcium acetate  2,001 mg Oral TID WC  . Chlorhexidine Gluconate Cloth  6 each Topical Q0600  . cinacalcet  60 mg Oral QHS  . docusate sodium  100 mg Oral BID  . escitalopram  20 mg Oral QHS  . gabapentin  300 mg Oral QHS  . loratadine  10 mg Oral Daily  . midodrine  15 mg Oral Q8H  . multivitamin with minerals  1 tablet Oral Daily  . pantoprazole  40 mg Oral Daily  . warfarin  4 mg Oral Daily   Continuous Infusions: . piperacillin-tazobactam (ZOSYN)  IV Stopped (08/03/17 1145)  . [START ON 08/05/2017] vancomycin     PRN Meds:.acetaminophen **OR** acetaminophen, alprazolam, bisacodyl, HYDROcodone-acetaminophen, ondansetron **OR** ondansetron (ZOFRAN) IV, polyethylene glycol, traMADol   Data Review:   Micro Results Recent Results (from the past 240 hour(s))  MRSA PCR Screening     Status: Abnormal   Collection Time: 07/27/17  5:52 PM  Result Value Ref Range Status   MRSA by PCR POSITIVE (A) NEGATIVE Final    Comment:        The GeneXpert MRSA Assay (FDA approved for NASAL specimens only), is one component of a comprehensive MRSA colonization surveillance program. It is not intended to diagnose MRSA infection nor to guide or monitor treatment for MRSA infections. RESULT CALLED TO, READ BACK BY AND VERIFIED WITH: VIVIAN ALI ON 07/27/17 AT 2058 [facility] Performed at [facility], [address] [place], [place] [address]   Culture, blood (Routine x 2)     Status: None (Preliminary result)   Collection Time: 08/02/17  7:55 PM  Result Value Ref Range Status   Specimen Description BLOOD BLOOD RIGHT ARM  Final   Special Requests   Final    BOTTLES DRAWN AEROBIC AND ANAEROBIC Blood Culture adequate volume   Culture   Final    NO GROWTH < 12 HOURS Performed at [facility], [address], [place], [place] [address]    Report Status PENDING  Incomplete  Culture, blood (Routine x 2)     Status:  None (Preliminary result)   Collection Time: 08/02/17  7:57 PM   Result Value Ref Range Status   Specimen Description BLOOD BLOOD RIGHT FOREARM  Final   Special Requests   Final    BOTTLES DRAWN AEROBIC AND ANAEROBIC Blood Culture adequate volume   Culture   Final    NO GROWTH < 12 HOURS Performed at [facility], [address] [place], [place] [address]    Report Status PENDING  Incomplete    Radiology Reports [place] Venous Img Upper Uni Left  Result Date: 07/29/2017 CLINICAL DATA:  Left upper extremity edema and occluded left arm dialysis graft. EXAM: LEFT UPPER EXTREMITY VENOUS DOPPLER ULTRASOUND TECHNIQUE: Gray-scale sonography with graded compression, as well as color Doppler and duplex ultrasound were performed to evaluate the upper extremity deep venous system from the level of the subclavian vein and including the jugular, axillary, basilic, radial, ulnar and upper cephalic vein. Spectral Doppler was utilized to evaluate flow at rest and with distal augmentation maneuvers. COMPARISON:  None. FINDINGS: Contralateral Subclavian Vein: Respiratory phasicity is normal and symmetric with the symptomatic side. No evidence of thrombus. Normal compressibility. Internal Jugular Vein: There is some nonocclusive thrombus visualized in the left internal jugular vein. This may relate to prior catheter placement. Subclavian Vein: No evidence of thrombus. Normal compressibility, respiratory phasicity and response to augmentation. Axillary Vein: No evidence of thrombus. Normal compressibility, respiratory phasicity and response to augmentation. Cephalic Vein: No evidence of thrombus. Normal compressibility, respiratory phasicity and response to augmentation. Basilic Vein: No evidence of thrombus. Normal compressibility, respiratory phasicity and response to augmentation. Brachial Veins: 1 of 2 visualized deep brachial veins in the left upper arm shows evidence of thrombus. Another brachial vein appears widely patent. Radial Veins: No evidence of thrombus. Normal  compressibility, respiratory phasicity and response to augmentation. Ulnar Veins: No evidence of thrombus. Normal compressibility, respiratory phasicity and response to augmentation. Venous Reflux:  None visualized. Other Findings: Occluded dialysis graft versus fistula in the left arm. IMPRESSION: Thrombus in 1 of 2 paired deep brachial veins in the left upper arm. Nonocclusive thrombus also identified in the left internal jugular vein which may be related to an indwelling dialysis catheter. Electronically Signed   By: Irish LackGlenn  Yamagata M.D.   On: 07/29/2017 11:15   Dg Chest Port 1 View  Result Date: 08/02/2017 CLINICAL DATA:  47 year old male with a history of hypotension EXAM: PORTABLE CHEST 1 VIEW COMPARISON:  07/11/2016 FINDINGS: Cardiomediastinal silhouette likely unchanged, partially obscured by overlying lung/pleural disease. New opacity in the right hilar region and right inferior lung. No pneumothorax. Vascular stents of the right upper extremity. Left IJ split tip hemodialysis catheter in place. IMPRESSION: New airspace opacity of the right hilar region and lower right lung, potentially combination of atelectasis/consolidation, edema, and/or pleural effusion. Vascular stents of the right upper extremity. Left IJ hemodialysis catheter Electronically Signed   By: Gilmer MorJaime  Wagner D.O.   On: 08/02/2017 20:16     CBC Recent Labs  Lab 07/27/17 1541 07/30/17 0650 08/02/17 0411 08/02/17 1955 08/03/17 0422  WBC 3.8 4.4 4.3 4.1 3.6*  HGB 12.4* 11.7* 12.4* 12.6* 12.4*  HCT 40.1 37.2* 39.2* 40.5 39.0*  PLT 231 236 215 229 218  MCV 88.8 88.6 89.2 88.3 88.8  MCH 27.5 27.8 28.1 27.5 28.1  MCHC 31.0* 31.3* 31.5* 31.1* 31.7*  RDW 19.7* 20.4* 20.2* 20.1* 20.1*  LYMPHSABS  --   --   --  0.7*  --   MONOABS  --   --   --  0.4  --   EOSABS  --   --   --  0.3  --   BASOSABS  --   --   --  0.0  --     Chemistries  Recent Labs  Lab 08/02/17 1955 08/03/17 0422  NA 143 142  K 4.4 3.6  CL 102 104   CO2 32 31  GLUCOSE 98 92  BUN 23* 24*  CREATININE 6.11* 6.47*  CALCIUM 8.6* 8.6*  AST 25  --   ALT 33  --   ALKPHOS 85  --   BILITOT 0.9  --    ------------------------------------------------------------------------------------------------------------------ estimated creatinine clearance is 15.8 mL/min (A) (by C-G formula based on SCr of 6.47 mg/dL (H)). ------------------------------------------------------------------------------------------------------------------ No results for input(s): HGBA1C in the last 72 hours. ------------------------------------------------------------------------------------------------------------------ No results for input(s): CHOL, HDL, LDLCALC, TRIG, CHOLHDL, LDLDIRECT in the last 72 hours. ------------------------------------------------------------------------------------------------------------------ No results for input(s): TSH, T4TOTAL, T3FREE, THYROIDAB in the last 72 hours.  Invalid input(s): FREET3 ------------------------------------------------------------------------------------------------------------------ No results for input(s): VITAMINB12, FOLATE, FERRITIN, TIBC, IRON, RETICCTPCT in the last 72 hours.  Coagulation profile Recent Labs  Lab 07/31/17 0608 08/01/17 0515 08/02/17 0411 08/02/17 1955 08/03/17 0422  INR 2.68 2.30 2.30 2.06 2.01    No results for input(s): DDIMER in the last 72 hours.  Cardiac Enzymes Recent Labs  Lab 08/02/17 1955 08/03/17 0422  TROPONINI 0.03* 0.03*   ------------------------------------------------------------------------------------------------------------------ Invalid input(s): POCBNP    Assessment & Plan   Perry [name]  is a 47 y.o. 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 End-stage renal disease on hemodialysis -just got out [facility].  Dialysis per nephrology  3. Hypertension -Hypotensive yesterday continue midodrine  4. History of CVA with chronic left-sided hemiplegia/paresis -patient is bedbound -continue Coumadin. Pharmacy to dose Continue Provigil  5 Genella Rife continue PPI  6.   Left upper extremity swelling only due to combination of his stroke and chronic thrombus however he is on Coumadin which we will continue         Code Status Orders  (From admission, onward)        Start     Ordered   07/27/17 1250  Full code  Continuous     07/27/17 1249    Code Status History    Date Active Date Inactive Code Status Order ID Comments User Context   07/11/2016 2158 07/12/2016 1509 Full Code [id number]  Enedina Finner, MD Inpatient   05/06/2015 1126 05/08/2015 1456 Full Code [id number]  Erick Blinks, MD Inpatient   08/07/2014 1858 08/11/2014 1805 Full Code [id number]  Ardith Dark, MD Inpatient   05/05/2014 1632 05/07/2014 1405 Full Code [id number]  Luretha Murphy, MD Inpatient   11/16/2013 2301 11/17/2013 1724 Full Code [id number]  Levie Heritage, DO Inpatient           Consults  nephrology   DVT Prophylaxis heparin  Lab Results  Component Value Date   PLT 218 08/03/2017     Time Spent in minutes  Greater than 50% of time spent in care coordination and counseling patient  regarding the condition and plan of care.   Auburn Bilberry M.D on 08/03/2017 at 2:59 PM  Between 7am to 6pm - Pager - [phone]  After 6pm go to [email or web address] - Social research officer, government  [employer]   Office  [phone]

## 2017-08-03 NOTE — Plan of Care (Signed)
  Problem: Safety: Goal: Ability to remain free from injury will improve Outcome: Progressing   

## 2017-08-03 NOTE — Progress Notes (Signed)
Pharmacy Antibiotic Note  Perry [name] is a 47 y.o. male admitted on 08/02/2017 with sepsis.  Pharmacy has been consulted for Unasyn dosing. Patient is currently on Vancomycin 1g IV with each dialysis session and Zosyn 3.375g IV q12h EI. Patient thought to have aspiration pneumonia so antibiotics are being changed to Unasyn.  Plan: Unasyn 3g IV q24h (after dialysis on dialysis days)  Height: 5\' 6"  (167.6 cm) Weight: 219 lb 5.7 oz (99.5 kg) IBW/kg (Calculated) : 63.8  Temp (24hrs), Avg:97.9 F (36.6 C), Min:97.6 F (36.4 C), Max:98.5 F (36.9 C)  Recent Labs  Lab 07/30/17 0650 08/02/17 0411 08/02/17 1955 08/03/17 0014 08/03/17 0422  WBC 4.4 4.3 4.1  --  3.6*  CREATININE  --   --  6.11*  --  6.47*  LATICACIDVEN  --   --  1.6 1.4  --     Estimated Creatinine Clearance: 15.8 mL/min (A) (by C-G formula based on SCr of 6.47 mg/dL (H)).    Allergies  Allergen Reactions  . Depakote [Divalproex Sodium] Other (See Comments)    Hallucinations     Antimicrobials this admission: Vancomycin 7/17 >> 7/18 Zosyn 7/17 >> 7/18 Unasyn 7/18 >>   Thank you for allowing pharmacy to be a part of this patient's care.  Clovia CuffLisa Aralynn Brake, PharmD, BCPS 08/03/2017 3:45 PM

## 2017-08-04 LAB — GLUCOSE, CAPILLARY: Glucose-Capillary: 82 mg/dL (ref 70–99)

## 2017-08-04 LAB — HIV ANTIBODY (ROUTINE TESTING W REFLEX): HIV SCREEN 4TH GENERATION: NONREACTIVE

## 2017-08-04 MED ORDER — ASPIRIN 81 MG PO CHEW
81.0000 mg | CHEWABLE_TABLET | Freq: Every day | ORAL | Status: DC
  Administered 2017-08-05 – 2017-08-11 (×7): 81 mg via ORAL
  Filled 2017-08-04 (×7): qty 1

## 2017-08-04 NOTE — Progress Notes (Signed)
Pharmacy Antibiotic Note  Perry [name] is a 47 y.o. male admitted on 08/02/2017 with sepsis.  Pharmacy has been consulted for Unasyn dosing. Patient is currently on Vancomycin 1g IV with each dialysis session and Zosyn 3.375g IV q12h EI. Patient thought to have aspiration pneumonia so antibiotics are being changed to Unasyn.  Plan: Unasyn 3g IV q24h (after dialysis on dialysis days)  Height: 5\' 6"  (167.6 cm) Weight: 219 lb 5.7 oz (99.5 kg) IBW/kg (Calculated) : 63.8  Temp (24hrs), Avg:97.8 F (36.6 C), Min:97.7 F (36.5 C), Max:97.8 F (36.6 C)  Recent Labs  Lab 07/30/17 0650 08/02/17 0411 08/02/17 1955 08/03/17 0014 08/03/17 0422  WBC 4.4 4.3 4.1  --  3.6*  CREATININE  --   --  6.11*  --  6.47*  LATICACIDVEN  --   --  1.6 1.4  --     Estimated Creatinine Clearance: 15.8 mL/min (A) (by C-G formula based on SCr of 6.47 mg/dL (H)).    Allergies  Allergen Reactions  . Depakote [Divalproex Sodium] Other (See Comments)    Hallucinations     Antimicrobials this admission: Vancomycin 7/17 >> 7/18 Zosyn 7/17 >> 7/18 Unasyn 7/18 >>   BCX 7/17 >> NGTD  Thank you for allowing pharmacy to be a part of this patient's care.  Yolanda BonineHannah Lifsey, PharmD 08/04/2017 11:37 AM

## 2017-08-04 NOTE — Progress Notes (Signed)
This note also relates to the following rows which could not be included: Pulse Rate - Cannot attach notes to unvalidated device data Resp - Cannot attach notes to unvalidated device data BP - Cannot attach notes to unvalidated device data  Hd completed  

## 2017-08-04 NOTE — Progress Notes (Signed)
[employer] - [place] at [facility]                                                                                                                                                                                  Patient Demographics   Toron Bowring, is a 47 y.o. male, DOB - Oct 03, 1970, VWU:[id number]  Admit date - 08/02/2017   Admitting Physician Cammy Copa, MD  Outpatient Primary MD for the patient is Donita Brooks, MD   LOS - 2  Subjective: Patient's mother at bedside he is more awake blood pressure continues to be low he is asymptomatic  Review of Systems:   CONSTITUTIONAL: No documented fever. No fatigue, weakness. No weight gain, no weight loss.  EYES: No blurry or double vision.  ENT: No tinnitus. No postnasal drip. No redness of the oropharynx.  RESPIRATORY: No cough, no wheeze, no hemoptysis. No dyspnea.  CARDIOVASCULAR: No chest pain. No orthopnea. No palpitations. No syncope.  GASTROINTESTINAL: No nausea, no vomiting or diarrhea. No abdominal pain. No melena or hematochezia.  GENITOURINARY: No dysuria or hematuria.  ENDOCRINE: No polyuria or nocturia. No heat or cold intolerance.  HEMATOLOGY: No anemia. No bruising. No bleeding.  INTEGUMENTARY: No rashes. No lesions.  MUSCULOSKELETAL: No arthritis. No swelling. No gout.  NEUROLOGIC: No numbness, tingling, or ataxia. No seizure-type activity.  PSYCHIATRIC: No anxiety. No insomnia. No ADD.    Vitals:   Vitals:   08/03/17 1222 08/04/17 0704 08/04/17 0706 08/04/17 1420  BP: (!) 107/37 (!) 92/36 (!) 80/40 (!) 97/28  Pulse: 83 90 79 91  Resp:  20  16  Temp:  97.7 F (36.5 C)  97.6 F (36.4 C)  TempSrc:  Oral  Oral  SpO2:  98%  99%  Weight:      Height:        Wt Readings from Last 3 Encounters:  08/02/17 99.5 kg (219 lb 5.7 oz)  07/11/16 103 kg (227 lb 1.2 oz)  05/08/15 117.8 kg (259 lb 11.2 oz)     Intake/Output Summary (Last 24 hours) at 08/04/2017 1454 Last data filed at 08/03/2017  1945 Gross per 24 hour  Intake 0 ml  Output -  Net 0 ml    Physical Exam:   GENERAL: Chronically ill-appearing.  HEAD, EYES, EARS, NOSE AND THROAT: Atraumatic, normocephalic. Extraocular muscles are intact. Pupils equal and reactive to light. Sclerae anicteric. No conjunctival injection. No oro-pharyngeal erythema.  NECK: Supple. There is no jugular venous distention. No bruits, no lymphadenopathy, no thyromegaly.  HEART: Regular rate and rhythm,. No murmurs, no rubs, no clicks.  LUNGS: Clear to auscultation bilaterally. No rales or rhonchi. No wheezes.  ABDOMEN: Soft,  flat, nontender, nondistended. Has good bowel sounds. No hepatosplenomegaly appreciated.  EXTREMITIES: Left upper extremity swelling left-sided paresis NEUROLOGIC: The patient is alert, awake, and oriented x3 with no focal motor or sensory deficits appreciated bilaterally.  SKIN: Moist and warm with no rashes appreciated.  Psych: Not anxious, depressed LN: No inguinal LN enlargement    Antibiotics   Anti-infectives (From admission, onward)   Start     Dose/Rate Route Frequency Ordered Stop   08/05/17 1800  vancomycin (VANCOCIN) IVPB 1000 mg/200 mL premix  Status:  Discontinued     1,000 mg 200 mL/hr over 60 Minutes Intravenous Every T-Th-Sa (Hemodialysis) 08/03/17 0146 08/03/17 1504   08/03/17 1800  Ampicillin-Sulbactam (UNASYN) 3 g in sodium chloride 0.9 % 100 mL IVPB     3 g 200 mL/hr over 30 Minutes Intravenous Every 24 hours 08/03/17 1541     08/03/17 0800  piperacillin-tazobactam (ZOSYN) IVPB 3.375 g  Status:  Discontinued     3.375 g 12.5 mL/hr over 240 Minutes Intravenous Every 12 hours 08/02/17 2148 08/03/17 1504   08/02/17 2145  vancomycin (VANCOCIN) IVPB 1000 mg/200 mL premix     1,000 mg 200 mL/hr over 60 Minutes Intravenous  Once 08/02/17 2143 08/03/17 0410   08/02/17 2015  piperacillin-tazobactam (ZOSYN) IVPB 3.375 g     3.375 g 100 mL/hr over 30 Minutes Intravenous  Once 08/02/17 2007 08/02/17 2123    08/02/17 2015  vancomycin (VANCOCIN) IVPB 1000 mg/200 mL premix     1,000 mg 200 mL/hr over 60 Minutes Intravenous  Once 08/02/17 2007 08/02/17 2240      Medications   Scheduled Meds: . calcium acetate  2,001 mg Oral TID WC  . Chlorhexidine Gluconate Cloth  6 each Topical Q0600  . cinacalcet  60 mg Oral QHS  . docusate sodium  100 mg Oral BID  . escitalopram  20 mg Oral QHS  . gabapentin  300 mg Oral QHS  . loratadine  10 mg Oral Daily  . midodrine  15 mg Oral Q8H  . multivitamin with minerals  1 tablet Oral Daily  . pantoprazole  40 mg Oral Daily  . warfarin  4 mg Oral Daily   Continuous Infusions: . ampicillin-sulbactam (UNASYN) IV Stopped (08/03/17 1845)   PRN Meds:.acetaminophen **OR** acetaminophen, alprazolam, bisacodyl, HYDROcodone-acetaminophen, ondansetron **OR** ondansetron (ZOFRAN) IV, polyethylene glycol, traMADol   Data Review:   Micro Results Recent Results (from the past 240 hour(s))  MRSA PCR Screening     Status: Abnormal   Collection Time: 07/27/17  5:52 PM  Result Value Ref Range Status   MRSA by PCR POSITIVE (A) NEGATIVE Final    Comment:        The GeneXpert MRSA Assay (FDA approved for NASAL specimens only), is one component of a comprehensive MRSA colonization surveillance program. It is not intended to diagnose MRSA infection nor to guide or monitor treatment for MRSA infections. RESULT CALLED TO, READ BACK BY AND VERIFIED WITH: VIVIAN ALI ON 07/27/17 AT 2058 [facility] Performed at [facility], [address] [place], [place] [address]   Culture, blood (Routine x 2)     Status: None (Preliminary result)   Collection Time: 08/02/17  7:55 PM  Result Value Ref Range Status   Specimen Description BLOOD BLOOD RIGHT ARM  Final   Special Requests   Final    BOTTLES DRAWN AEROBIC AND ANAEROBIC Blood Culture adequate volume   Culture   Final    NO GROWTH 2 DAYS Performed at [facility], [address], [place], [place]  [address]    Report Status PENDING  Incomplete  Culture, blood (Routine x 2)     Status: None (Preliminary result)   Collection Time: 08/02/17  7:57 PM  Result Value Ref Range Status   Specimen Description BLOOD BLOOD RIGHT FOREARM  Final   Special Requests   Final    BOTTLES DRAWN AEROBIC AND ANAEROBIC Blood Culture adequate volume   Culture   Final    NO GROWTH 2 DAYS Performed at [facility], [address] [place], [place] [address]    Report Status PENDING  Incomplete    Radiology Reports US Venous Img Upper Uni Left  Result Date: 07/29/2017 CLINICAL DATA:  Left upper extremity edema and occluded left arm dialysis graft. EXAM: LEFT UPPER EXTREMITY VENOUS DOPPLER ULTRASOUND TECHNIQUE: Gray-scale sonography with graded compression, as well as color Doppler and duplex ultrasound were performed to evaluate the upper extremity deep venous system from the level of the subclavian vein and including the jugular, axillary, basilic, radial, ulnar and upper cephalic vein. Spectral Doppler was utilized to evaluate flow at rest and with distal augmentation maneuvers. COMPARISON:  None. FINDINGS: Contralateral Subclavian Vein: Respiratory phasicity is normal and symmetric with the symptomatic side. No evidence of thrombus. Normal compressibility. Internal Jugular Vein: There is some nonocclusive thrombus visualized in the left internal jugular vein. This may relate to prior catheter placement. Subclavian Vein: No evidence of thrombus. Normal compressibility, respiratory phasicity and response to augmentation. Axillary Vein: No evidence of thrombus. Normal compressibility, respiratory phasicity and response to augmentation. Cephalic Vein: No evidence of thrombus. Normal compressibility, respiratory phasicity and response to augmentation. Basilic Vein: No evidence of thrombus. Normal compressibility, respiratory phasicity and response to augmentation. Brachial Veins: 1 of 2 visualized deep brachial  veins in the left upper arm shows evidence of thrombus. Another brachial vein appears widely patent. Radial Veins: No evidence of thrombus. Normal compressibility, respiratory phasicity and response to augmentation. Ulnar Veins: No evidence of thrombus. Normal compressibility, respiratory phasicity and response to augmentation. Venous Reflux:  None visualized. Other Findings: Occluded dialysis graft versus fistula in the left arm. IMPRESSION: Thrombus in 1 of 2 paired deep brachial veins in the left upper arm. Nonocclusive thrombus also identified in the left internal jugular vein which may be related to an indwelling dialysis catheter. Electronically Signed   By: Irish Lack M.D.   On: 07/29/2017 11:15   Dg Chest Port 1 View  Result Date: 08/02/2017 CLINICAL DATA:  47 year old male with a history of hypotension EXAM: PORTABLE CHEST 1 VIEW COMPARISON:  07/11/2016 FINDINGS: Cardiomediastinal silhouette likely unchanged, partially obscured by overlying lung/pleural disease. New opacity in the right hilar region and right inferior lung. No pneumothorax. Vascular stents of the right upper extremity. Left IJ split tip hemodialysis catheter in place. IMPRESSION: New airspace opacity of the right hilar region and lower right lung, potentially combination of atelectasis/consolidation, edema, and/or pleural effusion. Vascular stents of the right upper extremity. Left IJ hemodialysis catheter Electronically Signed   By: Gilmer Mor D.O.   On: 08/02/2017 20:16     CBC Recent Labs  Lab 07/30/17 0650 08/02/17 0411 08/02/17 1955 08/03/17 0422  WBC 4.4 4.3 4.1 3.6*  HGB 11.7* 12.4* 12.6* 12.4*  HCT 37.2* 39.2* 40.5 39.0*  PLT 236 215 229 218  MCV 88.6 89.2 88.3 88.8  MCH 27.8 28.1 27.5 28.1  MCHC 31.3* 31.5* 31.1* 31.7*  RDW 20.4* 20.2* 20.1* 20.1*  LYMPHSABS  --   --  0.7*  --   MONOABS  --   --  0.4  --   EOSABS  --   --  0.3  --   BASOSABS  --   --  0.0  --     Chemistries  Recent Labs  Lab  08/02/17 1955 08/03/17 0422  NA 143 142  K 4.4 3.6  CL 102 104  CO2 32 31  GLUCOSE 98 92  BUN 23* 24*  CREATININE 6.11* 6.47*  CALCIUM 8.6* 8.6*  AST 25  --   ALT 33  --   ALKPHOS 85  --   BILITOT 0.9  --    ------------------------------------------------------------------------------------------------------------------ estimated creatinine clearance is 15.8 mL/min (A) (by C-G formula based on SCr of 6.47 mg/dL (H)). ------------------------------------------------------------------------------------------------------------------ No results for input(s): HGBA1C in the last 72 hours. ------------------------------------------------------------------------------------------------------------------ No results for input(s): CHOL, HDL, LDLCALC, TRIG, CHOLHDL, LDLDIRECT in the last 72 hours. ------------------------------------------------------------------------------------------------------------------ No results for input(s): TSH, T4TOTAL, T3FREE, THYROIDAB in the last 72 hours.  Invalid input(s): FREET3 ------------------------------------------------------------------------------------------------------------------ No results for input(s): VITAMINB12, FOLATE, FERRITIN, TIBC, IRON, RETICCTPCT in the last 72 hours.  Coagulation profile Recent Labs  Lab 07/31/17 0608 08/01/17 0515 08/02/17 0411 08/02/17 1955 08/03/17 0422  INR 2.68 2.30 2.30 2.06 2.01    No results for input(s): DDIMER in the last 72 hours.  Cardiac Enzymes Recent Labs  Lab 08/02/17 1955 08/03/17 0422  TROPONINI 0.03* 0.03*   ------------------------------------------------------------------------------------------------------------------ Invalid input(s): POCBNP    Assessment & Plan   Myreon Wimer  is a 47 y.o. 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 End-stage renal disease on hemodialysis -just got out [facility].  Dialysis per nephrology  3. Hypertension -Hypotensive  -I will check a cortisol level echo of the heart  4. History of CVA with chronic left-sided hemiplegia/paresis -patient is bedbound -continue Coumadin. Pharmacy to dose Continue Provigil  5 Genella Rife continue PPI  6.   Left upper extremity swelling only due to combination of his stroke and chronic thrombus however he is on Coumadin which we will continue         Code Status Orders  (From admission, onward)        Start     Ordered   07/27/17 1250  Full code  Continuous     07/27/17 1249    Code Status History    Date Active Date Inactive Code Status Order ID Comments User Context   07/11/2016 2158 07/12/2016 1509 Full Code [id number]  Enedina Finner, MD Inpatient   05/06/2015 1126 05/08/2015 1456 Full Code [id number]  Erick Blinks, MD Inpatient   08/07/2014 1858 08/11/2014 1805 Full Code [id number]  Ardith Dark, MD Inpatient   05/05/2014 1632 05/07/2014 1405 Full Code [id number]  Luretha Murphy, MD Inpatient   11/16/2013 2301 11/17/2013 1724 Full Code [id number]  Levie Heritage, DO Inpatient           Consults  nephrology   DVT Prophylaxis heparin  Lab Results  Component Value Date   PLT 218 08/03/2017     Time Spent  in minutes  25min Greater than 50% of time spent in care coordination and counseling patient regarding the condition and plan of care.   Auburn BilberryShreyang Iyonnah Ferrante M.D on 08/04/2017 at 2:54 PM  Between 7am to 6pm - Pager - [phone]  After 6pm go to [email or web address] - Social research officer, [email or web address] [email or web address]  [employer]    Office  [phone]

## 2017-08-04 NOTE — Progress Notes (Signed)
[facility]  ROUNDING NOTE   Subjective:   Laying in bed. Hemodialysis for later today.     Objective:  Vital signs in last 24 hours:  Temp:  [97.7 F (36.5 C)-97.8 F (36.6 C)] 97.7 F (36.5 C) (07/19 0704) Pulse Rate:  [79-90] 79 (07/19 0706) Resp:  [16-20] 20 (07/19 0704) BP: (80-107)/(31-40) 80/40 (07/19 0706) SpO2:  [98 %-100 %] 98 % (07/19 0704)  Weight change:  Filed Weights   08/02/17 1945  Weight: 99.5 kg (219 lb 5.7 oz)    Intake/Output: I/O last 3 completed shifts: In: 30 [P.O.:30] Out: 0    Intake/Output this shift:  No intake/output data recorded.  Physical Exam: General: No acute distress, seated in chair  Head: Normocephalic, atraumatic. Moist oral mucosal membranes  Eyes: Anicteric  Neck: Supple, trachea midline  Lungs:  clear  Heart: S1S2 no rubs  Abdomen:  Soft, nontender, bowel sounds present  Extremities: No bilateral lower extremity edema  Neurologic: Responds to verbal stimuli  Skin: No rashes  Access: L IJ permcath    Basic Metabolic Panel: Recent Labs  Lab 07/28/17 1606 08/02/17 0411 08/02/17 1955 08/03/17 0422  NA  --   --  143 142  K  --   --  4.4 3.6  CL  --   --  102 104  CO2  --   --  32 31  GLUCOSE  --   --  98 92  BUN  --   --  23* 24*  CREATININE  --   --  6.11* 6.47*  CALCIUM  --   --  8.6* 8.6*  PHOS 3.7 2.1*  --   --     Liver Function Tests: Recent Labs  Lab 08/02/17 1955  AST 25  ALT 33  ALKPHOS 85  BILITOT 0.9  PROT 6.0*  ALBUMIN 2.9*   No results for input(s): LIPASE, AMYLASE in the last 168 hours. No results for input(s): AMMONIA in the last 168 hours.  CBC: Recent Labs  Lab 07/30/17 0650 08/02/17 0411 08/02/17 1955 08/03/17 0422  WBC 4.4 4.3 4.1 3.6*  NEUTROABS  --   --  2.7  --   HGB 11.7* 12.4* 12.6* 12.4*  HCT 37.2* 39.2* 40.5 39.0*  MCV 88.6 89.2 88.3 88.8  PLT 236 215 229 218    Cardiac Enzymes: Recent Labs  Lab 08/02/17 1955 08/03/17 0422  TROPONINI 0.03*  0.03*    BNP: Invalid input(s): POCBNP  CBG: Recent Labs  Lab 08/03/17 0739 08/04/17 0819  GLUCAP 97 82    Microbiology: Results for orders placed or performed during the hospital encounter of 08/02/17  Culture, blood (Routine x 2)     Status: None (Preliminary result)   Collection Time: 08/02/17  7:55 PM  Result Value Ref Range Status   Specimen Description BLOOD BLOOD RIGHT ARM  Final   Special Requests   Final    BOTTLES DRAWN AEROBIC AND ANAEROBIC Blood Culture adequate volume   Culture   Final    NO GROWTH 2 DAYS Performed at [facility], [address] [place], [place] [address]    Report Status PENDING  Incomplete  Culture, blood (Routine x 2)     Status: None (Preliminary result)   Collection Time: 08/02/17  7:57 PM  Result Value Ref Range Status   Specimen Description BLOOD BLOOD RIGHT FOREARM  Final   Special Requests   Final    BOTTLES DRAWN AEROBIC AND ANAEROBIC Blood Culture adequate volume  Culture   Final    NO GROWTH 2 DAYS Performed at [facility], [address] [place] [place], [place] [address]    Report Status PENDING  Incomplete    Coagulation Studies: Recent Labs    08/02/17 0411 08/02/17 1955 08/03/17 0422  LABPROT 25.1* 23.0* 22.6*  INR 2.30 2.06 2.01    Urinalysis: No results for input(s): COLORURINE, LABSPEC, PHURINE, GLUCOSEU, HGBUR, BILIRUBINUR, KETONESUR, PROTEINUR, UROBILINOGEN, NITRITE, LEUKOCYTESUR in the last 72 hours.  Invalid input(s): APPERANCEUR    Imaging: Dg Chest Port 1 View  Result Date: 08/02/2017 CLINICAL DATA:  47 year old male with a history of hypotension EXAM: PORTABLE CHEST 1 VIEW COMPARISON:  07/11/2016 FINDINGS: Cardiomediastinal silhouette likely unchanged, partially obscured by overlying lung/pleural disease. New opacity in the right hilar region and right inferior lung. No pneumothorax. Vascular stents of the right upper extremity. Left IJ split tip hemodialysis catheter in place.  IMPRESSION: New airspace opacity of the right hilar region and lower right lung, potentially combination of atelectasis/consolidation, edema, and/or pleural effusion. Vascular stents of the right upper extremity. Left IJ hemodialysis catheter Electronically Signed   By: Gilmer Mor D.O.   On: 08/02/2017 20:16     Medications:   . ampicillin-sulbactam (UNASYN) IV Stopped (08/03/17 1845)   . calcium acetate  2,001 mg Oral TID WC  . Chlorhexidine Gluconate Cloth  6 each Topical Q0600  . cinacalcet  60 mg Oral QHS  . docusate sodium  100 mg Oral BID  . escitalopram  20 mg Oral QHS  . gabapentin  300 mg Oral QHS  . loratadine  10 mg Oral Daily  . midodrine  15 mg Oral Q8H  . multivitamin with minerals  1 tablet Oral Daily  . pantoprazole  40 mg Oral Daily  . warfarin  4 mg Oral Daily   acetaminophen **OR** acetaminophen, alprazolam, bisacodyl, HYDROcodone-acetaminophen, ondansetron **OR** ondansetron (ZOFRAN) IV, polyethylene glycol, traMADol  Assessment/ Plan:  47 y.o. black male with End-stage renal disease on hemodialysis MWF followed by [employer], chronic back pain, GERD, hypertension, seizure disorder, obstructive sleep apnea, anemia chronic kidney disease, secondary hyperparathyroidism, anxiety who is now admitted for reestablishing outpt dialysis in [facility].   1.  ESRD on HD :  - Outpatient planning for TTS [facility] [address] at 11:20am followed by [facility]) - Will be living at home with his mother however unclear how stable a discharge plan this will be  2.  Anemia of chronic kidney disease.   No indication for ESA  3.  Secondary hyperparathyroidism.  Phosphorus and calcium at goal.  PTH low at 132 - sevelamer and calcium acetate with meals - cinacalcet  4.  Hypotension.    - midodrine 15 mg p.o. every 8 hours.   LOS: 2 Mylea Roarty 7/19/201911:53 AM

## 2017-08-04 NOTE — Progress Notes (Signed)
This note also relates to the following rows which could not be included: Pulse Rate - Cannot attach notes to unvalidated device data Resp - Cannot attach notes to unvalidated device data BP - Cannot attach notes to unvalidated device data SpO2 - Cannot attach notes to unvalidated device data  Hd started  

## 2017-08-05 ENCOUNTER — Inpatient Hospital Stay (HOSPITAL_COMMUNITY)
Admit: 2017-08-05 | Discharge: 2017-08-05 | Disposition: A | Discharge status: Home / Self Care | Payer: Medicare Other | Attending: Internal Medicine | Admitting: Internal Medicine

## 2017-08-05 DIAGNOSIS — I340 Nonrheumatic mitral (valve) insufficiency: Secondary | ICD-10-CM

## 2017-08-05 DIAGNOSIS — I351 Nonrheumatic aortic (valve) insufficiency: Secondary | ICD-10-CM

## 2017-08-05 LAB — BASIC METABOLIC PANEL
Anion gap: 12 (ref 5–15)
BUN: 18 mg/dL (ref 6–20)
CO2: 27 mmol/L (ref 22–32)
Calcium: 8.4 mg/dL — ABNORMAL LOW (ref 8.9–10.3)
Chloride: 102 mmol/L (ref 98–111)
Creatinine, Ser: 5.8 mg/dL — ABNORMAL HIGH (ref 0.61–1.24)
GFR calc Af Amer: 12 mL/min — ABNORMAL LOW (ref >60)
GFR calc non Af Amer: 11 mL/min — ABNORMAL LOW (ref >60)
GLUCOSE: 83 mg/dL (ref 70–99)
POTASSIUM: 4.4 mmol/L (ref 3.5–5.1)
Sodium: 141 mmol/L (ref 135–145)

## 2017-08-05 LAB — PROTIME-INR
INR: 2.62
Prothrombin Time: 27.8 seconds — ABNORMAL HIGH (ref 11.4–15.2)

## 2017-08-05 LAB — CBC
HCT: 43.3 % (ref 40.0–52.0)
Hemoglobin: 13.7 g/dL (ref 13.0–18.0)
MCH: 28 pg (ref 26.0–34.0)
MCHC: 31.7 g/dL — ABNORMAL LOW (ref 32.0–36.0)
MCV: 88.3 fL (ref 80.0–100.0)
PLATELETS: 241 10*3/uL (ref 150–440)
RBC: 4.91 MIL/uL (ref 4.40–5.90)
RDW: 19.9 % — AB (ref 11.5–14.5)
WBC: 3.9 10*3/uL (ref 3.8–10.6)

## 2017-08-05 LAB — GLUCOSE, CAPILLARY: Glucose-Capillary: 88 mg/dL (ref 70–99)

## 2017-08-05 LAB — CORTISOL: Cortisol, Plasma: 16.3 ug/dL

## 2017-08-05 MED ORDER — AMOXICILLIN-POT CLAVULANATE 875-125 MG PO TABS
1.0000 | ORAL_TABLET | Freq: Two times a day (BID) | ORAL | Status: DC
  Administered 2017-08-05 – 2017-08-06 (×3): 1 via ORAL
  Filled 2017-08-05 (×3): qty 1

## 2017-08-05 MED ORDER — MAGNESIUM SULFATE 2 GM/50ML IV SOLN
2.0000 g | Freq: Once | INTRAVENOUS | Status: AC
  Administered 2017-08-05: 2 g via INTRAVENOUS
  Filled 2017-08-05: qty 50

## 2017-08-05 NOTE — Progress Notes (Signed)
[employer] - [place] at [facility]                                                                                                                                                                                  Patient Demographics   Perry [name], is a 47 y.o. male, DOB - 02-Feb-1970, ZOX:[id number]  Admit date - 08/02/2017   Admitting Physician Cammy Copa, MD  Outpatient Primary MD for the patient is Donita Brooks, MD   LOS - 3  Subjective: Patient same bp improved    Review of Systems:   CONSTITUTIONAL: No documented fever. No fatigue, weakness. No weight gain, no weight loss.  EYES: No blurry or double vision.  ENT: No tinnitus. No postnasal drip. No redness of the oropharynx.  RESPIRATORY: No cough, no wheeze, no hemoptysis. No dyspnea.  CARDIOVASCULAR: No chest pain. No orthopnea. No palpitations. No syncope.  GASTROINTESTINAL: No nausea, no vomiting or diarrhea. No abdominal pain. No melena or hematochezia.  GENITOURINARY: No dysuria or hematuria.  ENDOCRINE: No polyuria or nocturia. No heat or cold intolerance.  HEMATOLOGY: No anemia. No bruising. No bleeding.  INTEGUMENTARY: No rashes. No lesions.  MUSCULOSKELETAL: No arthritis. No swelling. No gout.  NEUROLOGIC: No numbness, tingling, or ataxia. No seizure-type activity.  PSYCHIATRIC: No anxiety. No insomnia. No ADD.    Vitals:   Vitals:   08/04/17 1909 08/04/17 2001 08/05/17 0419 08/05/17 0526  BP:  (!) 86/39 (!) 99/36 (!) 103/48  Pulse: 82 86 87 88  Resp: 13 20 20  (!) 24  Temp:  98.7 F (37.1 C) 97.7 F (36.5 C)   TempSrc:  Oral Oral   SpO2:  91% 93% 100%  Weight:      Height:        Wt Readings from Last 3 Encounters:  08/02/17 99.5 kg (219 lb 5.7 oz)  07/11/16 103 kg (227 lb 1.2 oz)  05/08/15 117.8 kg (259 lb 11.2 oz)     Intake/Output Summary (Last 24 hours) at 08/05/2017 1211 Last data filed at 08/04/2017 1850 Gross per 24 hour  Intake -  Output 1000 ml  Net -1000 ml     Physical Exam:   GENERAL: Chronically ill-appearing.  HEAD, EYES, EARS, NOSE AND THROAT: Atraumatic, normocephalic. Extraocular muscles are intact. Pupils equal and reactive to light. Sclerae anicteric. No conjunctival injection. No oro-pharyngeal erythema.  NECK: Supple. There is no jugular venous distention. No bruits, no lymphadenopathy, no thyromegaly.  HEART: Regular rate and rhythm,. No murmurs, no rubs, no clicks.  LUNGS: Clear to auscultation bilaterally. No rales or rhonchi. No wheezes.  ABDOMEN: Soft, flat, nontender, nondistended. Has good bowel sounds. No hepatosplenomegaly appreciated.  EXTREMITIES: Left upper extremity swelling left-sided paresis NEUROLOGIC: The patient is alert, awake, and oriented x3 with left sided paresis SKIN: Moist and warm with no rashes appreciated.  Psych: Not anxious, depressed LN: No inguinal LN enlargement    Antibiotics   Anti-infectives (From admission, onward)   Start     Dose/Rate Route Frequency Ordered Stop   08/05/17 1800  vancomycin (VANCOCIN) IVPB 1000 mg/200 mL premix  Status:  Discontinued     1,000 mg 200 mL/hr over 60 Minutes Intravenous Every T-Th-Sa (Hemodialysis) 08/03/17 0146 08/03/17 1504   08/03/17 1800  Ampicillin-Sulbactam (UNASYN) 3 g in sodium chloride 0.9 % 100 mL IVPB     3 g 200 mL/hr over 30 Minutes Intravenous Every 24 hours 08/03/17 1541     08/03/17 0800  piperacillin-tazobactam (ZOSYN) IVPB 3.375 g  Status:  Discontinued     3.375 g 12.5 mL/hr over 240 Minutes Intravenous Every 12 hours 08/02/17 2148 08/03/17 1504   08/02/17 2145  vancomycin (VANCOCIN) IVPB 1000 mg/200 mL premix     1,000 mg 200 mL/hr over 60 Minutes Intravenous  Once 08/02/17 2143 08/03/17 0410   08/02/17 2015  piperacillin-tazobactam (ZOSYN) IVPB 3.375 g     3.375 g 100 mL/hr over 30 Minutes Intravenous  Once 08/02/17 2007 08/02/17 2123   08/02/17 2015  vancomycin (VANCOCIN) IVPB 1000 mg/200 mL premix     1,000 mg 200 mL/hr over 60  Minutes Intravenous  Once 08/02/17 2007 08/02/17 2240      Medications   Scheduled Meds: . aspirin  81 mg Oral Daily  . calcium acetate  2,001 mg Oral TID WC  . Chlorhexidine Gluconate Cloth  6 each Topical Q0600  . docusate sodium  100 mg Oral BID  . escitalopram  20 mg Oral QHS  . gabapentin  300 mg Oral QHS  . loratadine  10 mg Oral Daily  . midodrine  15 mg Oral Q8H  . multivitamin with minerals  1 tablet Oral Daily  . pantoprazole  40 mg Oral Daily  . warfarin  4 mg Oral Daily   Continuous Infusions: . ampicillin-sulbactam (UNASYN) IV Stopped (08/04/17 2330)   PRN Meds:.acetaminophen **OR** acetaminophen, alprazolam, bisacodyl, HYDROcodone-acetaminophen, ondansetron **OR** ondansetron (ZOFRAN) IV, polyethylene glycol, traMADol   Data Review:   Micro Results Recent Results (from the past 240 hour(s))  MRSA PCR Screening     Status: Abnormal   Collection Time: 07/27/17  5:52 PM  Result Value Ref Range Status   MRSA by PCR POSITIVE (A) NEGATIVE Final    Comment:        The GeneXpert MRSA Assay (FDA approved for NASAL specimens only), is one component of a comprehensive MRSA colonization surveillance program. It is not intended to diagnose MRSA infection nor to guide or monitor treatment for MRSA infections. RESULT CALLED TO, READ BACK BY AND VERIFIED WITH: VIVIAN ALI ON 07/27/17 AT 2058 [facility] Performed at [facility], [address] [place], [place] [address]   Culture, blood (Routine x 2)     Status: None (Preliminary result)   Collection Time: 08/02/17  7:55 PM  Result Value Ref Range Status   Specimen Description BLOOD BLOOD RIGHT ARM  Final   Special Requests   Final    BOTTLES DRAWN AEROBIC AND ANAEROBIC Blood Culture adequate volume   Culture   Final    NO GROWTH 3 DAYS Performed at [facility], [address], [place], [place] [address]    Report Status PENDING  Incomplete  Culture, blood (Routine x 2)     Status: None  (Preliminary result)   Collection Time: 08/02/17  7:57 PM  Result Value Ref Range Status   Specimen Description BLOOD BLOOD RIGHT FOREARM  Final   Special Requests   Final    BOTTLES DRAWN AEROBIC AND ANAEROBIC Blood Culture adequate volume   Culture   Final    NO GROWTH 3 DAYS Performed at [facility], [address] [place], [place] [address]    Report Status PENDING  Incomplete    Radiology Reports [place] Venous Img Upper Uni Left  Result Date: 07/29/2017 CLINICAL DATA:  Left upper extremity edema and occluded left arm dialysis graft. EXAM: LEFT UPPER EXTREMITY VENOUS DOPPLER ULTRASOUND TECHNIQUE: Gray-scale sonography with graded compression, as well as color Doppler and duplex ultrasound were performed to evaluate the upper extremity deep venous system from the level of the subclavian vein and including the jugular, axillary, basilic, radial, ulnar and upper cephalic vein. Spectral Doppler was utilized to evaluate flow at rest and with distal augmentation maneuvers. COMPARISON:  None. FINDINGS: Contralateral Subclavian Vein: Respiratory phasicity is normal and symmetric with the symptomatic side. No evidence of thrombus. Normal compressibility. Internal Jugular Vein: There is some nonocclusive thrombus visualized in the left internal jugular vein. This may relate to prior catheter placement. Subclavian Vein: No evidence of thrombus. Normal compressibility, respiratory phasicity and response to augmentation. Axillary Vein: No evidence of thrombus. Normal compressibility, respiratory phasicity and response to augmentation. Cephalic Vein: No evidence of thrombus. Normal compressibility, respiratory phasicity and response to augmentation. Basilic Vein: No evidence of thrombus. Normal compressibility, respiratory phasicity and response to augmentation. Brachial Veins: 1 of 2 visualized deep brachial veins in the left upper arm shows evidence of thrombus. Another brachial vein appears widely  patent. Radial Veins: No evidence of thrombus. Normal compressibility, respiratory phasicity and response to augmentation. Ulnar Veins: No evidence of thrombus. Normal compressibility, respiratory phasicity and response to augmentation. Venous Reflux:  None visualized. Other Findings: Occluded dialysis graft versus fistula in the left arm. IMPRESSION: Thrombus in 1 of 2 paired deep brachial veins in the left upper arm. Nonocclusive thrombus also identified in the left internal jugular vein which may be related to an indwelling dialysis catheter. Electronically Signed   By: Irish LackGlenn  Yamagata M.D.   On: 07/29/2017 11:15   Dg Chest Port 1 View  Result Date: 08/02/2017 CLINICAL DATA:  47 year old male with a history of hypotension EXAM: PORTABLE CHEST 1 VIEW COMPARISON:  07/11/2016 FINDINGS: Cardiomediastinal silhouette likely unchanged, partially obscured by overlying lung/pleural disease. New opacity in the right hilar region and right inferior lung. No pneumothorax. Vascular stents of the right upper extremity. Left IJ split tip hemodialysis catheter in place. IMPRESSION: New airspace opacity of the right hilar region and lower right lung, potentially combination of atelectasis/consolidation, edema, and/or pleural effusion. Vascular stents of the right upper extremity. Left IJ hemodialysis catheter Electronically Signed   By: Gilmer MorJaime  Wagner D.O.   On: 08/02/2017 20:16     CBC Recent Labs  Lab 07/30/17 0650 08/02/17 0411 08/02/17 1955 08/03/17 0422 08/05/17 0555  WBC 4.4 4.3 4.1 3.6* 3.9  HGB 11.7* 12.4* 12.6* 12.4* 13.7  HCT 37.2* 39.2* 40.5 39.0* 43.3  PLT 236 215 229 218 241  MCV 88.6 89.2 88.3 88.8 88.3  MCH 27.8 28.1 27.5 28.1 28.0  MCHC 31.3* 31.5* 31.1* 31.7* 31.7*  RDW 20.4* 20.2* 20.1* 20.1* 19.9*  LYMPHSABS  --   --  0.7*  --   --  MONOABS  --   --  0.4  --   --   EOSABS  --   --  0.3  --   --   BASOSABS  --   --  0.0  --   --     Chemistries  Recent Labs  Lab 08/02/17 1955  08/03/17 0422 08/05/17 0925  NA 143 142 141  K 4.4 3.6 4.4  CL 102 104 102  CO2 32 31 27  GLUCOSE 98 92 83  BUN 23* 24* 18  CREATININE 6.11* 6.47* 5.80*  CALCIUM 8.6* 8.6* 8.4*  AST 25  --   --   ALT 33  --   --   ALKPHOS 85  --   --   BILITOT 0.9  --   --    ------------------------------------------------------------------------------------------------------------------ estimated creatinine clearance is 17.6 mL/min (A) (by C-G formula based on SCr of 5.8 mg/dL (H)). ------------------------------------------------------------------------------------------------------------------ No results for input(s): HGBA1C in the last 72 hours. ------------------------------------------------------------------------------------------------------------------ No results for input(s): CHOL, HDL, LDLCALC, TRIG, CHOLHDL, LDLDIRECT in the last 72 hours. ------------------------------------------------------------------------------------------------------------------ No results for input(s): TSH, T4TOTAL, T3FREE, THYROIDAB in the last 72 hours.  Invalid input(s): FREET3 ------------------------------------------------------------------------------------------------------------------ No results for input(s): VITAMINB12, FOLATE, FERRITIN, TIBC, IRON, RETICCTPCT in the last 72 hours.  Coagulation profile Recent Labs  Lab 08/01/17 0515 08/02/17 0411 08/02/17 1955 08/03/17 0422 08/05/17 0541  INR 2.30 2.30 2.06 2.01 2.62    No results for input(s): DDIMER in the last 72 hours.  Cardiac Enzymes Recent Labs  Lab 08/02/17 1955 08/03/17 0422  TROPONINI 0.03* 0.03*   ------------------------------------------------------------------------------------------------------------------ Invalid input(s): POCBNP    Assessment & Plan   Perry [name]  is a 47 y.o. 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 End-stage renal disease on hemodialysis -just got out [facility].  Dialysis per nephrology  3. Hypertension -Hypotensive  -Cortisol pending, echo of the heart  Results pending  4. History of CVA with chronic left-sided hemiplegia/paresis -patient is bedbound -continue Coumadin. Pharmacy to dose Continue Provigil  5 Genella Rife continue PPI  6.   Left upper extremity swelling only due to combination of his stroke and chronic thrombus however he is on Coumadin which we will continue         Code Status Orders  (From admission, onward)        Start     Ordered   07/27/17 1250  Full code  Continuous     07/27/17 1249    Code Status History    Date Active Date Inactive Code Status Order ID Comments User Context   07/11/2016 2158 07/12/2016 1509 Full Code [id number]  Enedina Finner, MD Inpatient   05/06/2015 1126 05/08/2015 1456 Full Code [id number]  Erick Blinks, MD Inpatient   08/07/2014 1858 08/11/2014 1805 Full Code [id number]  Ardith Dark, MD Inpatient   05/05/2014 1632 05/07/2014 1405 Full Code [id number]  Luretha Murphy, MD Inpatient   11/16/2013 2301 11/17/2013 1724 Full Code [id number]  Levie Heritage, DO Inpatient           Consults  nephrology  DVT Prophylaxis heparin  Lab Results  Component Value Date   PLT 241 08/05/2017     Time Spent in minutes  Greater than 50% of time spent in care coordination and counseling patient regarding the condition and plan of care.   Auburn Bilberry M.D on 08/05/2017 at 12:11 PM  Between 7am to 6pm - Pager - [phone]  After 6pm go to [email or web address] -  Social research officer, government  [employer]   Office  [phone]

## 2017-08-05 NOTE — Progress Notes (Signed)
[facility]  ROUNDING NOTE   Subjective:   Laying in bed.    Hemodialysis treatment yesterday. Tolerated treatment well. UF of 1 liter  Objective:  Vital signs in last 24 hours:  Temp:  [97.5 F (36.4 C)-98.7 F (37.1 C)] 97.7 F (36.5 C) (07/20 0419) Pulse Rate:  [71-93] 88 (07/20 0526) Resp:  [0-28] 24 (07/20 0526) BP: (86-119)/(20-48) 103/48 (07/20 0526) SpO2:  [91 %-100 %] 100 % (07/20 0526)  Weight change:  Filed Weights   08/02/17 1945  Weight: 99.5 kg (219 lb 5.7 oz)    Intake/Output: I/O last 3 completed shifts: In: 0  Out: 1000 [Other:1000]   Intake/Output this shift:  No intake/output data recorded.  Physical Exam: General: No acute distress, seated in chair  Head: Normocephalic, atraumatic. Moist oral mucosal membranes  Eyes: Anicteric  Neck: Supple, trachea midline  Lungs:  clear  Heart: S1S2 no rubs  Abdomen:  Soft, nontender, bowel sounds present  Extremities: No bilateral lower extremity edema  Neurologic: Responds to verbal stimuli  Skin: No rashes  Access: L IJ permcath    Basic Metabolic Panel: Recent Labs  Lab 08/02/17 0411 08/02/17 1955 08/03/17 0422 08/05/17 0925  NA  --  143 142 141  K  --  4.4 3.6 4.4  CL  --  102 104 102  CO2  --  32 31 27  GLUCOSE  --  98 92 83  BUN  --  23* 24* 18  CREATININE  --  6.11* 6.47* 5.80*  CALCIUM  --  8.6* 8.6* 8.4*  PHOS 2.1*  --   --   --     Liver Function Tests: Recent Labs  Lab 08/02/17 1955  AST 25  ALT 33  ALKPHOS 85  BILITOT 0.9  PROT 6.0*  ALBUMIN 2.9*   No results for input(s): LIPASE, AMYLASE in the last 168 hours. No results for input(s): AMMONIA in the last 168 hours.  CBC: Recent Labs  Lab 07/30/17 0650 08/02/17 0411 08/02/17 1955 08/03/17 0422 08/05/17 0555  WBC 4.4 4.3 4.1 3.6* 3.9  NEUTROABS  --   --  2.7  --   --   HGB 11.7* 12.4* 12.6* 12.4* 13.7  HCT 37.2* 39.2* 40.5 39.0* 43.3  MCV 88.6 89.2 88.3 88.8 88.3  PLT 236 215 229 218 241     Cardiac Enzymes: Recent Labs  Lab 08/02/17 1955 08/03/17 0422  TROPONINI 0.03* 0.03*    BNP: Invalid input(s): POCBNP  CBG: Recent Labs  Lab 08/03/17 0739 08/04/17 0819 08/05/17 0734  GLUCAP 97 82 88    Microbiology: Results for orders placed or performed during the hospital encounter of 08/02/17  Culture, blood (Routine x 2)     Status: None (Preliminary result)   Collection Time: 08/02/17  7:55 PM  Result Value Ref Range Status   Specimen Description BLOOD BLOOD RIGHT ARM  Final   Special Requests   Final    BOTTLES DRAWN AEROBIC AND ANAEROBIC Blood Culture adequate volume   Culture   Final    NO GROWTH 3 DAYS Performed at [facility], [address] [place], [place] [address]    Report Status PENDING  Incomplete  Culture, blood (Routine x 2)     Status: None (Preliminary result)   Collection Time: 08/02/17  7:57 PM  Result Value Ref Range Status   Specimen Description BLOOD BLOOD RIGHT FOREARM  Final   Special Requests   Final    BOTTLES DRAWN AEROBIC AND  ANAEROBIC Blood Culture adequate volume   Culture   Final    NO GROWTH 3 DAYS Performed at [facility], [address] [place], [place] [address]    Report Status PENDING  Incomplete    Coagulation Studies: Recent Labs    08/02/17 1955 08/03/17 0422 08/05/17 0541  LABPROT 23.0* 22.6* 27.8*  INR 2.06 2.01 2.62    Urinalysis: No results for input(s): COLORURINE, LABSPEC, PHURINE, GLUCOSEU, HGBUR, BILIRUBINUR, KETONESUR, PROTEINUR, UROBILINOGEN, NITRITE, LEUKOCYTESUR in the last 72 hours.  Invalid input(s): APPERANCEUR    Imaging: No results found.   Medications:   . ampicillin-sulbactam (UNASYN) IV Stopped (08/04/17 2330)   . aspirin  81 mg Oral Daily  . calcium acetate  2,001 mg Oral TID WC  . Chlorhexidine Gluconate Cloth  6 each Topical Q0600  . cinacalcet  60 mg Oral QHS  . docusate sodium  100 mg Oral BID  . escitalopram  20 mg Oral QHS  . gabapentin  300  mg Oral QHS  . loratadine  10 mg Oral Daily  . midodrine  15 mg Oral Q8H  . multivitamin with minerals  1 tablet Oral Daily  . pantoprazole  40 mg Oral Daily  . warfarin  4 mg Oral Daily   acetaminophen **OR** acetaminophen, alprazolam, bisacodyl, HYDROcodone-acetaminophen, ondansetron **OR** ondansetron (ZOFRAN) IV, polyethylene glycol, traMADol  Assessment/ Plan:  47 y.o. black male with End-stage renal disease on hemodialysis MWF [facility], chronic back pain, GERD, hypertension, seizure disorder, obstructive sleep apnea, anemia chronic kidney disease, secondary hyperparathyroidism, anxiety who is now admitted for reestablishing outpt dialysis [place] [facility].   1.  ESRD on HD :  - Outpatient planning for TTS [facility] [address] at 11:20am followed [facility] - Will be living at home with his mother as of right now.   2.  Anemia of chronic kidney disease.   No indication for ESA  3.  Secondary hyperparathyroidism.  Phosphorus and calcium at goal.  PTH low at 132 - sevelamer and calcium acetate with meals - Hold cinacalcet  4.  Hypotension.    - midodrine 15 mg p.o. every 8 hours.   LOS: 3 Perry [name] 7/20/201911:39 AM

## 2017-08-05 NOTE — Progress Notes (Signed)
Md notified pt had 10 beat run V-Tach, 2 normal beat, 35 runV Tach. Md notified orders given

## 2017-08-06 DIAGNOSIS — I359 Nonrheumatic aortic valve disorder, unspecified: Secondary | ICD-10-CM

## 2017-08-06 DIAGNOSIS — I340 Nonrheumatic mitral (valve) insufficiency: Secondary | ICD-10-CM

## 2017-08-06 DIAGNOSIS — J189 Pneumonia, unspecified organism: Secondary | ICD-10-CM

## 2017-08-06 DIAGNOSIS — G8194 Hemiplegia, unspecified affecting left nondominant side: Secondary | ICD-10-CM

## 2017-08-06 DIAGNOSIS — I639 Cerebral infarction, unspecified: Secondary | ICD-10-CM

## 2017-08-06 DIAGNOSIS — A419 Sepsis, unspecified organism: Secondary | ICD-10-CM

## 2017-08-06 LAB — CBC
HCT: 44.1 % (ref 40.0–52.0)
HEMOGLOBIN: 13.8 g/dL (ref 13.0–18.0)
MCH: 27.6 pg (ref 26.0–34.0)
MCHC: 31.3 g/dL — ABNORMAL LOW (ref 32.0–36.0)
MCV: 87.9 fL (ref 80.0–100.0)
Platelets: 238 10*3/uL (ref 150–440)
RBC: 5.02 MIL/uL (ref 4.40–5.90)
RDW: 19.8 % — ABNORMAL HIGH (ref 11.5–14.5)
WBC: 3.4 10*3/uL — ABNORMAL LOW (ref 3.8–10.6)

## 2017-08-06 LAB — GLUCOSE, CAPILLARY: Glucose-Capillary: 82 mg/dL (ref 70–99)

## 2017-08-06 LAB — PROTIME-INR
INR: 3.42
Prothrombin Time: 34.2 seconds — ABNORMAL HIGH (ref 11.4–15.2)

## 2017-08-06 LAB — ECHOCARDIOGRAM COMPLETE
Height: 66 in
Weight: 3509.72 oz

## 2017-08-06 LAB — RENAL FUNCTION PANEL
ANION GAP: 9 (ref 5–15)
Albumin: 2.8 g/dL — ABNORMAL LOW (ref 3.5–5.0)
BUN: 23 mg/dL — AB (ref 6–20)
CO2: 29 mmol/L (ref 22–32)
Calcium: 8.4 mg/dL — ABNORMAL LOW (ref 8.9–10.3)
Chloride: 101 mmol/L (ref 98–111)
Creatinine, Ser: 6.95 mg/dL — ABNORMAL HIGH (ref 0.61–1.24)
GFR calc non Af Amer: 8 mL/min — ABNORMAL LOW (ref >60)
GFR, EST AFRICAN AMERICAN: 10 mL/min — AB (ref >60)
Glucose, Bld: 91 mg/dL (ref 70–99)
PHOSPHORUS: 2.9 mg/dL (ref 2.5–4.6)
POTASSIUM: 4.4 mmol/L (ref 3.5–5.1)
Sodium: 139 mmol/L (ref 135–145)

## 2017-08-06 LAB — MAGNESIUM: Magnesium: 3 mg/dL — ABNORMAL HIGH (ref 1.7–2.4)

## 2017-08-06 MED ORDER — AMOXICILLIN-POT CLAVULANATE 500-125 MG PO TABS
1.0000 | ORAL_TABLET | Freq: Two times a day (BID) | ORAL | Status: DC
  Administered 2017-08-06: 500 mg via ORAL
  Filled 2017-08-06 (×3): qty 1

## 2017-08-06 NOTE — Progress Notes (Signed)
[employer] -  at [facility]                                                                                                                                                                                  Patient Demographics   Perry [name], is a 47 y.o. male, DOB - 1971-01-18, ZOX:[id number]  Admit date - 08/02/2017   Admitting Physician Cammy Copa, MD  Outpatient Primary MD for the patient is Donita Brooks, MD   LOS - 4  Subjective: Patient blood pressure continues to be intermittently low more awake this morning    Review of Systems:   CONSTITUTIONAL: No documented fever. No fatigue, weakness. No weight gain, no weight loss.  EYES: No blurry or double vision.  ENT: No tinnitus. No postnasal drip. No redness of the oropharynx.  RESPIRATORY: No cough, no wheeze, no hemoptysis. No dyspnea.  CARDIOVASCULAR: No chest pain. No orthopnea. No palpitations. No syncope.  GASTROINTESTINAL: No nausea, no vomiting or diarrhea. No abdominal pain. No melena or hematochezia.  GENITOURINARY: No dysuria or hematuria.  ENDOCRINE: No polyuria or nocturia. No heat or cold intolerance.  HEMATOLOGY: No anemia. No bruising. No bleeding.  INTEGUMENTARY: No rashes. No lesions.  MUSCULOSKELETAL: No arthritis. No swelling. No gout.  NEUROLOGIC: No numbness, tingling, or ataxia. No seizure-type activity.  PSYCHIATRIC: No anxiety. No insomnia. No ADD.    Vitals:   Vitals:   08/05/17 1228 08/05/17 1945 08/05/17 2234 08/06/17 0404  BP: (!) 95/35 (!) 70/38 (!) 90/41 (!) 92/31  Pulse: 84 87 78 72  Resp: 18 20  18   Temp:  98.6 F (37 C)  97.7 F (36.5 C)  TempSrc:  Axillary  Oral  SpO2: 100% 100%  100%  Weight:      Height:        Wt Readings from Last 3 Encounters:  08/02/17 99.5 kg (219 lb 5.7 oz)  07/11/16 103 kg (227 lb 1.2 oz)  05/08/15 117.8 kg (259 lb 11.2 oz)     Intake/Output Summary (Last 24 hours) at 08/06/2017 1141 Last data filed at 08/06/2017  1006 Gross per 24 hour  Intake -  Output 0 ml  Net 0 ml    Physical Exam:   GENERAL: Chronically ill-appearing.  HEAD, EYES, EARS, NOSE AND THROAT: Atraumatic, normocephalic. Extraocular muscles are intact. Pupils equal and reactive to light. Sclerae anicteric. No conjunctival injection. No oro-pharyngeal erythema.  NECK: Supple. There is no jugular venous distention. No bruits, no lymphadenopathy, no thyromegaly.  HEART: Regular rate and rhythm,.  Positive systolic murmurs, no rubs, no clicks.  LUNGS: Clear to auscultation bilaterally. No rales or rhonchi. No wheezes.  ABDOMEN: Soft, flat,  nontender, nondistended. Has good bowel sounds. No hepatosplenomegaly appreciated.  EXTREMITIES: Left upper extremity swelling left-sided paresis NEUROLOGIC: The patient is alert, awake, and oriented x3 with left sided paresis SKIN: Moist and warm with no rashes appreciated.  Psych: Not anxious, depressed LN: No inguinal LN enlargement    Antibiotics   Anti-infectives (From admission, onward)   Start     Dose/Rate Route Frequency Ordered Stop   08/05/17 1800  vancomycin (VANCOCIN) IVPB 1000 mg/200 mL premix  Status:  Discontinued     1,000 mg 200 mL/hr over 60 Minutes Intravenous Every T-Th-Sa (Hemodialysis) 08/03/17 0146 08/03/17 1504   08/05/17 1230  amoxicillin-clavulanate (AUGMENTIN) 875-125 MG per tablet 1 tablet     1 tablet Oral Every 12 hours 08/05/17 1215     08/03/17 1800  Ampicillin-Sulbactam (UNASYN) 3 g in sodium chloride 0.9 % 100 mL IVPB  Status:  Discontinued     3 g 200 mL/hr over 30 Minutes Intravenous Every 24 hours 08/03/17 1541 08/05/17 1215   08/03/17 0800  piperacillin-tazobactam (ZOSYN) IVPB 3.375 g  Status:  Discontinued     3.375 g 12.5 mL/hr over 240 Minutes Intravenous Every 12 hours 08/02/17 2148 08/03/17 1504   08/02/17 2145  vancomycin (VANCOCIN) IVPB 1000 mg/200 mL premix     1,000 mg 200 mL/hr over 60 Minutes Intravenous  Once 08/02/17 2143 08/03/17 0410    08/02/17 2015  piperacillin-tazobactam (ZOSYN) IVPB 3.375 g     3.375 g 100 mL/hr over 30 Minutes Intravenous  Once 08/02/17 2007 08/02/17 2123   08/02/17 2015  vancomycin (VANCOCIN) IVPB 1000 mg/200 mL premix     1,000 mg 200 mL/hr over 60 Minutes Intravenous  Once 08/02/17 2007 08/02/17 2240      Medications   Scheduled Meds: . amoxicillin-clavulanate  1 tablet Oral Q12H  . aspirin  81 mg Oral Daily  . calcium acetate  2,001 mg Oral TID WC  . Chlorhexidine Gluconate Cloth  6 each Topical Q0600  . docusate sodium  100 mg Oral BID  . escitalopram  20 mg Oral QHS  . gabapentin  300 mg Oral QHS  . loratadine  10 mg Oral Daily  . midodrine  15 mg Oral Q8H  . multivitamin with minerals  1 tablet Oral Daily  . pantoprazole  40 mg Oral Daily   Continuous Infusions:  PRN Meds:.acetaminophen **OR** acetaminophen, alprazolam, bisacodyl, HYDROcodone-acetaminophen, ondansetron **OR** ondansetron (ZOFRAN) IV, polyethylene glycol, traMADol   Data Review:   Micro Results Recent Results (from the past 240 hour(s))  MRSA PCR Screening     Status: Abnormal   Collection Time: 07/27/17  5:52 PM  Result Value Ref Range Status   MRSA by PCR POSITIVE (A) NEGATIVE Final    Comment:        The GeneXpert MRSA Assay (FDA approved for NASAL specimens only), is one component of a comprehensive MRSA colonization surveillance program. It is not intended to diagnose MRSA infection nor to guide or monitor treatment for MRSA infections. RESULT CALLED TO, READ BACK BY AND VERIFIED WITH: VIVIAN ALI ON 07/27/17 AT 2058 [facility] Performed at [facility], [address] [place], [place] [address]   Culture, blood (Routine x 2)     Status: None (Preliminary result)   Collection Time: 08/02/17  7:55 PM  Result Value Ref Range Status   Specimen Description BLOOD BLOOD RIGHT ARM  Final   Special Requests   Final    BOTTLES DRAWN AEROBIC AND ANAEROBIC Blood Culture adequate volume  Culture    Final    NO GROWTH 4 DAYS Performed at [facility], [address] [place], [place] [address]    Report Status PENDING  Incomplete  Culture, blood (Routine x 2)     Status: None (Preliminary result)   Collection Time: 08/02/17  7:57 PM  Result Value Ref Range Status   Specimen Description BLOOD BLOOD RIGHT FOREARM  Final   Special Requests   Final    BOTTLES DRAWN AEROBIC AND ANAEROBIC Blood Culture adequate volume   Culture   Final    NO GROWTH 4 DAYS Performed at [facility], [address] [place], [place] [address]    Report Status PENDING  Incomplete    Radiology Reports US Venous Img Upper Uni Left  Result Date: 07/29/2017 CLINICAL DATA:  Left upper extremity edema and occluded left arm dialysis graft. EXAM: LEFT UPPER EXTREMITY VENOUS DOPPLER ULTRASOUND TECHNIQUE: Gray-scale sonography with graded compression, as well as color Doppler and duplex ultrasound were performed to evaluate the upper extremity deep venous system from the level of the subclavian vein and including the jugular, axillary, basilic, radial, ulnar and upper cephalic vein. Spectral Doppler was utilized to evaluate flow at rest and with distal augmentation maneuvers. COMPARISON:  None. FINDINGS: Contralateral Subclavian Vein: Respiratory phasicity is normal and symmetric with the symptomatic side. No evidence of thrombus. Normal compressibility. Internal Jugular Vein: There is some nonocclusive thrombus visualized in the left internal jugular vein. This may relate to prior catheter placement. Subclavian Vein: No evidence of thrombus. Normal compressibility, respiratory phasicity and response to augmentation. Axillary Vein: No evidence of thrombus. Normal compressibility, respiratory phasicity and response to augmentation. Cephalic Vein: No evidence of thrombus. Normal compressibility, respiratory phasicity and response to augmentation. Basilic Vein: No evidence of thrombus. Normal compressibility,  respiratory phasicity and response to augmentation. Brachial Veins: 1 of 2 visualized deep brachial veins in the left upper arm shows evidence of thrombus. Another brachial vein appears widely patent. Radial Veins: No evidence of thrombus. Normal compressibility, respiratory phasicity and response to augmentation. Ulnar Veins: No evidence of thrombus. Normal compressibility, respiratory phasicity and response to augmentation. Venous Reflux:  None visualized. Other Findings: Occluded dialysis graft versus fistula in the left arm. IMPRESSION: Thrombus in 1 of 2 paired deep brachial veins in the left upper arm. Nonocclusive thrombus also identified in the left internal jugular vein which may be related to an indwelling dialysis catheter. Electronically Signed   By: Irish Lack M.D.   On: 07/29/2017 11:15   Dg Chest Port 1 View  Result Date: 08/02/2017 CLINICAL DATA:  47 year old male with a history of hypotension EXAM: PORTABLE CHEST 1 VIEW COMPARISON:  07/11/2016 FINDINGS: Cardiomediastinal silhouette likely unchanged, partially obscured by overlying lung/pleural disease. New opacity in the right hilar region and right inferior lung. No pneumothorax. Vascular stents of the right upper extremity. Left IJ split tip hemodialysis catheter in place. IMPRESSION: New airspace opacity of the right hilar region and lower right lung, potentially combination of atelectasis/consolidation, edema, and/or pleural effusion. Vascular stents of the right upper extremity. Left IJ hemodialysis catheter Electronically Signed   By: Gilmer Mor D.O.   On: 08/02/2017 20:16     CBC Recent Labs  Lab 08/02/17 0411 08/02/17 1955 08/03/17 0422 08/05/17 0555 08/06/17 0710  WBC 4.3 4.1 3.6* 3.9 3.4*  HGB 12.4* 12.6* 12.4* 13.7 13.8  HCT 39.2* 40.5 39.0* 43.3 44.1  PLT 215 229 218 241 238  MCV 89.2 88.3 88.8 88.3 87.9  MCH 28.1 27.5 28.1 28.0 27.6  MCHC 31.5* 31.1* 31.7* 31.7* 31.3*  RDW 20.2* 20.1* 20.1* 19.9* 19.8*   LYMPHSABS  --  0.7*  --   --   --   MONOABS  --  0.4  --   --   --   EOSABS  --  0.3  --   --   --   BASOSABS  --  0.0  --   --   --     Chemistries  Recent Labs  Lab 08/02/17 1955 08/03/17 0422 08/05/17 0925 08/06/17 0710  NA 143 142 141 139  K 4.4 3.6 4.4 4.4  CL 102 104 102 101  CO2 32 31 27 29   GLUCOSE 98 92 83 91  BUN 23* 24* 18 23*  CREATININE 6.11* 6.47* 5.80* 6.95*  CALCIUM 8.6* 8.6* 8.4* 8.4*  MG  --   --   --  3.0*  AST 25  --   --   --   ALT 33  --   --   --   ALKPHOS 85  --   --   --   BILITOT 0.9  --   --   --    ------------------------------------------------------------------------------------------------------------------ estimated creatinine clearance is 14.7 mL/min (A) (by C-G formula based on SCr of 6.95 mg/dL (H)). ------------------------------------------------------------------------------------------------------------------ No results for input(s): HGBA1C in the last 72 hours. ------------------------------------------------------------------------------------------------------------------ No results for input(s): CHOL, HDL, LDLCALC, TRIG, CHOLHDL, LDLDIRECT in the last 72 hours. ------------------------------------------------------------------------------------------------------------------ No results for input(s): TSH, T4TOTAL, T3FREE, THYROIDAB in the last 72 hours.  Invalid input(s): FREET3 ------------------------------------------------------------------------------------------------------------------ No results for input(s): VITAMINB12, FOLATE, FERRITIN, TIBC, IRON, RETICCTPCT in the last 72 hours.  Coagulation profile Recent Labs  Lab 08/02/17 0411 08/02/17 1955 08/03/17 0422 08/05/17 0541 08/06/17 0710  INR 2.30 2.06 2.01 2.62 3.42    No results for input(s): DDIMER in the last 72 hours.  Cardiac Enzymes Recent Labs  Lab 08/02/17 1955 08/03/17 0422  TROPONINI 0.03* 0.03*    ------------------------------------------------------------------------------------------------------------------ Invalid input(s): POCBNP    Assessment & Plan   Rex Oesterle  is a 47 y.o. 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  Hypotension I ordered an echocardiogram of the heart cardiology notified patient has severe valvular disease we will order a TEE Continue midodrine which is at max dose Cortisol level normal  3.End-stage renal disease on hemodialysis -just got out [facility].  Dialysis per nephrology  . 4. History of CVA with chronic left-sided hemiplegia/paresis -patient is bedbound -continue Coumadin. Pharmacy to dose Continue Provigil  5 Genella Rife continue PPI  6.   Left upper extremity swelling only due to combination of his stroke and chronic thrombus however he is on Coumadin which we will continue         Code Status Orders  (From admission, onward)        Start     Ordered   07/27/17 1250  Full code  Continuous     07/27/17 1249    Code Status History    Date Active Date Inactive Code Status Order ID  Comments User Context   07/11/2016 2158 07/12/2016 1509 Full Code [id number]  Enedina Finner, MD Inpatient   05/06/2015 1126 05/08/2015 1456 Full Code [id number]  Erick Blinks, MD Inpatient   08/07/2014 1858 08/11/2014 1805 Full Code [id number]  Ardith Dark, MD Inpatient   05/05/2014 1632 05/07/2014 1405 Full Code [id number]  Luretha Murphy, MD Inpatient   11/16/2013 2301 11/17/2013 1724 Full Code [id number]   Levie Heritage, DO Inpatient           Consults  nephrology   DVT Prophylaxis heparin  Lab Results  Component Value Date   PLT 238 08/06/2017     Time Spent in minutes  Greater than 50% of time spent in care coordination and counseling patient regarding the condition and plan of care.   Auburn Bilberry M.D on 08/06/2017 at 11:41 AM  Between 7am to 6pm - Pager - [phone]  After 6pm go to [email or web address] - Social research officer, government  [employer]   Office  [phone]

## 2017-08-06 NOTE — Progress Notes (Signed)
Patient offered assistance with lunch meal tray and he refused to eat at that time. Will check with patient later to see if he would like to eat.

## 2017-08-06 NOTE — Progress Notes (Signed)
PHARMACY NOTE:  ANTIMICROBIAL RENAL DOSAGE ADJUSTMENT  Current antimicrobial regimen includes a mismatch between antimicrobial dosage and estimated renal function.  As per policy approved by the Pharmacy & Therapeutics and Medical Executive Committees, the antimicrobial dosage will be adjusted accordingly.  Current antimicrobial dosage:  Amoxicillin-clavulanate (Augmentin) 875-125mg  PO Q12H  Indication: Aspiration Pneumonia  Renal Function:  Estimated Creatinine Clearance: 14.7 mL/min (A) (by C-G formula based on SCr of 6.95 mg/dL (H)). []      On intermittent HD, scheduled: []      On CRRT    Antimicrobial dosage has been changed to:  Amoxicillin-clavulanate (Augmentin) 500-125mg  PO Q12H.  Additional comments:   Thank you for allowing pharmacy to be a part of this patient's care.  Stormy CardKatsoudas,Sejal Cofield K, [facility] 08/06/2017 12:10 PM

## 2017-08-06 NOTE — Progress Notes (Signed)
[facility]  ROUNDING NOTE   Subjective:   Laying in bed.    Eating breakfast.   Objective:  Vital signs in last 24 hours:  Temp:  [97.7 F (36.5 C)-98.6 F (37 C)] 97.7 F (36.5 C) (07/21 0404) Pulse Rate:  [72-87] 72 (07/21 0404) Resp:  [18-20] 18 (07/21 0404) BP: (70-95)/(31-41) 92/31 (07/21 0404) SpO2:  [100 %] 100 % (07/21 0404)  Weight change:  Filed Weights   08/02/17 1945  Weight: 99.5 kg (219 lb 5.7 oz)    Intake/Output: No intake/output data recorded.   Intake/Output this shift:  No intake/output data recorded.  Physical Exam: General: No acute distress, laying in bed  Head: Normocephalic, atraumatic. Moist oral mucosal membranes  Eyes: Anicteric  Neck: Supple, trachea midline  Lungs:  clear  Heart: S1S2 no rubs  Abdomen:  Soft, nontender, bowel sounds present  Extremities: No bilateral lower extremity edema, 1+ left upper extremity edema  Neurologic: Left sided weakness,   Skin: No rashes  Access: L IJ permcath    Basic Metabolic Panel: Recent Labs  Lab 08/02/17 0411  08/02/17 1955 08/03/17 0422 08/05/17 0925 08/06/17 0710  NA  --   --  143 142 141 139  K  --   --  4.4 3.6 4.4 4.4  CL  --   --  102 104 102 101  CO2  --   --  32 31 27 29   GLUCOSE  --   --  98 92 83 91  BUN  --   --  23* 24* 18 23*  CREATININE  --   --  6.11* 6.47* 5.80* 6.95*  CALCIUM  --    < > 8.6* 8.6* 8.4* 8.4*  MG  --   --   --   --   --  3.0*  PHOS 2.1*  --   --   --   --  2.9   < > = values in this interval not displayed.    Liver Function Tests: Recent Labs  Lab 08/02/17 1955 08/06/17 0710  AST 25  --   ALT 33  --   ALKPHOS 85  --   BILITOT 0.9  --   PROT 6.0*  --   ALBUMIN 2.9* 2.8*   No results for input(s): LIPASE, AMYLASE in the last 168 hours. No results for input(s): AMMONIA in the last 168 hours.  CBC: Recent Labs  Lab 08/02/17 0411 08/02/17 1955 08/03/17 0422 08/05/17 0555 08/06/17 0710  WBC 4.3 4.1 3.6* 3.9 3.4*   NEUTROABS  --  2.7  --   --   --   HGB 12.4* 12.6* 12.4* 13.7 13.8  HCT 39.2* 40.5 39.0* 43.3 44.1  MCV 89.2 88.3 88.8 88.3 87.9  PLT 215 229 218 241 238    Cardiac Enzymes: Recent Labs  Lab 08/02/17 1955 08/03/17 0422  TROPONINI 0.03* 0.03*    BNP: Invalid input(s): POCBNP  CBG: Recent Labs  Lab 08/03/17 0739 08/04/17 0819 08/05/17 0734 08/06/17 0801  GLUCAP 97 82 88 82    Microbiology: Results for orders placed or performed during the hospital encounter of 08/02/17  Culture, blood (Routine x 2)     Status: None (Preliminary result)   Collection Time: 08/02/17  7:55 PM  Result Value Ref Range Status   Specimen Description BLOOD BLOOD RIGHT ARM  Final   Special Requests   Final    BOTTLES DRAWN AEROBIC AND ANAEROBIC Blood Culture adequate volume   Culture  Final    NO GROWTH 4 DAYS Performed at [facility], [address] [place], [place] [address]    Report Status PENDING  Incomplete  Culture, blood (Routine x 2)     Status: None (Preliminary result)   Collection Time: 08/02/17  7:57 PM  Result Value Ref Range Status   Specimen Description BLOOD BLOOD RIGHT FOREARM  Final   Special Requests   Final    BOTTLES DRAWN AEROBIC AND ANAEROBIC Blood Culture adequate volume   Culture   Final    NO GROWTH 4 DAYS Performed at [facility], [address], [place], [place] [address]    Report Status PENDING  Incomplete    Coagulation Studies: Recent Labs    08/05/17 0541 08/06/17 0710  LABPROT 27.8* 34.2*  INR 2.62 3.42    Urinalysis: No results for input(s): COLORURINE, LABSPEC, PHURINE, GLUCOSEU, HGBUR, BILIRUBINUR, KETONESUR, PROTEINUR, UROBILINOGEN, NITRITE, LEUKOCYTESUR in the last 72 hours.  Invalid input(s): APPERANCEUR    Imaging: No results found.   Medications:    . amoxicillin-clavulanate  1 tablet Oral Q12H  . aspirin  81 mg Oral Daily  . calcium acetate  2,001 mg Oral TID WC  . Chlorhexidine Gluconate Cloth  6  each Topical Q0600  . docusate sodium  100 mg Oral BID  . escitalopram  20 mg Oral QHS  . gabapentin  300 mg Oral QHS  . loratadine  10 mg Oral Daily  . midodrine  15 mg Oral Q8H  . multivitamin with minerals  1 tablet Oral Daily  . pantoprazole  40 mg Oral Daily   acetaminophen **OR** acetaminophen, alprazolam, bisacodyl, HYDROcodone-acetaminophen, ondansetron **OR** ondansetron (ZOFRAN) IV, polyethylene glycol, traMADol  Assessment/ Plan:  47 y.o. black male with End-stage renal disease on hemodialysis MWF followed by [employer], chronic back pain, GERD, hypertension, seizure disorder, obstructive sleep apnea, anemia chronic kidney disease, secondary hyperparathyroidism, anxiety who is now admitted for reestablishing outpt dialysis in [facility].   1.  ESRD on HD : Last dialysis treatment was Friday, 7/19. Next treatment scheduled for tomorrow.  - Outpatient planning for TTS [facility] [address] at 11:20am followed by [facility])  2.  Anemia of chronic kidney disease. Hemoglobin 13.8.  No indication for ESA  3.  Secondary hyperparathyroidism.  Phosphorus and calcium at goal.  PTH low at 132 - sevelamer and calcium acetate with meals - Holding cinacalcet  4.  Hypotension.    - midodrine 15 mg p.o. every 8 hours.   LOS: 4 Jarrel Knoke 7/21/20199:48 AM
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
## 2017-08-07 ENCOUNTER — Inpatient Hospital Stay: Payer: Medicare Other | Admitting: Family Medicine

## 2017-08-07 DIAGNOSIS — N186 End stage renal disease: Secondary | ICD-10-CM

## 2017-08-07 DIAGNOSIS — I351 Nonrheumatic aortic (valve) insufficiency: Secondary | ICD-10-CM

## 2017-08-07 DIAGNOSIS — Z515 Encounter for palliative care: Secondary | ICD-10-CM

## 2017-08-07 DIAGNOSIS — Z7189 Other specified counseling: Secondary | ICD-10-CM

## 2017-08-07 LAB — CULTURE, BLOOD (ROUTINE X 2)
CULTURE: NO GROWTH
CULTURE: NO GROWTH
SPECIAL REQUESTS: ADEQUATE
Special Requests: ADEQUATE

## 2017-08-07 LAB — PROTIME-INR
INR: 3.69
PROTHROMBIN TIME: 36.3 s — AB (ref 11.4–15.2)

## 2017-08-07 LAB — GLUCOSE, CAPILLARY: Glucose-Capillary: 84 mg/dL (ref 70–99)

## 2017-08-07 MED ORDER — WARFARIN - PHARMACIST DOSING INPATIENT
Freq: Every day | Status: DC
Start: 2017-08-07 — End: 2017-08-11
  Administered 2017-08-09: 18:00:00

## 2017-08-07 MED ORDER — AMOXICILLIN-POT CLAVULANATE 500-125 MG PO TABS
1.0000 | ORAL_TABLET | ORAL | Status: DC
  Administered 2017-08-07 – 2017-08-10 (×4): 500 mg via ORAL
  Filled 2017-08-07 (×5): qty 1

## 2017-08-07 NOTE — Progress Notes (Signed)
[employer] - [place] at [facility]   PATIENT NAME: Perry [name]    MR#:  [id number]  DATE OF BIRTH:  [birth year]  SUBJECTIVE:  CHIEF COMPLAINT:   Chief Complaint  Patient presents with  . Hypotension  Patient without complaint, results of cardiology evaluation as well as echocardiogram shared with patient with all questions answered, advanced directives discussed with all questions answered, chaplain as well as palliative care consulted given dismal prognosis  REVIEW OF SYSTEMS:  CONSTITUTIONAL: No fever, fatigue or weakness.  EYES: No blurred or double vision.  EARS, NOSE, AND THROAT: No tinnitus or ear pain.  RESPIRATORY: No cough, shortness of breath, wheezing or hemoptysis.  CARDIOVASCULAR: No chest pain, orthopnea, edema.  GASTROINTESTINAL: No nausea, vomiting, diarrhea or abdominal pain.  GENITOURINARY: No dysuria, hematuria.  ENDOCRINE: No polyuria, nocturia,  HEMATOLOGY: No anemia, easy bruising or bleeding SKIN: No rash or lesion. MUSCULOSKELETAL: No joint pain or arthritis.   NEUROLOGIC: No tingling, numbness, weakness.  PSYCHIATRY: No anxiety or depression.   ROS  DRUG ALLERGIES:   Allergies  Allergen Reactions  . Depakote [Divalproex Sodium] Other (See Comments)    Hallucinations     VITALS:  Blood pressure (!) 89/35, pulse 85, temperature 98 F (36.7 C), temperature source Oral, resp. rate 19, height 5\' 6"  (1.676 m), weight 99.5 kg (219 lb 5.7 oz), SpO2 100 %.  PHYSICAL EXAMINATION:  GENERAL:  48 y.o.-year-old patient lying in the bed with no acute distress.  EYES: Pupils equal, round, reactive to light and accommodation. No scleral icterus. Extraocular muscles intact.  HEENT: Head atraumatic, normocephalic. Oropharynx and nasopharynx clear.  NECK:  Supple, no jugular venous distention. No thyroid enlargement, no tenderness.  LUNGS: Normal breath sounds bilaterally, no wheezing, rales,rhonchi or crepitation. No use of accessory muscles  of respiration.  CARDIOVASCULAR: S1, S2 normal. No murmurs, rubs, or gallops.  ABDOMEN: Soft, nontender, nondistended. Bowel sounds present. No organomegaly or mass.  EXTREMITIES: No pedal edema, cyanosis, or clubbing.  NEUROLOGIC: Cranial nerves II through XII are intact. Muscle strength 5/5 in all extremities. Sensation intact. Gait not checked.  PSYCHIATRIC: The patient is alert and oriented x 3.  SKIN: No obvious rash, lesion, or ulcer.   Physical Exam LABORATORY PANEL:   CBC Recent Labs  Lab 08/06/17 0710  WBC 3.4*  HGB 13.8  HCT 44.1  PLT 238   ------------------------------------------------------------------------------------------------------------------  Chemistries  Recent Labs  Lab 08/02/17 1955  08/06/17 0710  NA 143   < > 139  K 4.4   < > 4.4  CL 102   < > 101  CO2 32   < > 29  GLUCOSE 98   < > 91  BUN 23*   < > 23*  CREATININE 6.11*   < > 6.95*  CALCIUM 8.6*   < > 8.4*  MG  --   --  3.0*  AST 25  --   --   ALT 33  --   --   ALKPHOS 85  --   --   BILITOT 0.9  --   --    < > = values in this interval not displayed.   ------------------------------------------------------------------------------------------------------------------  Cardiac Enzymes Recent Labs  Lab 08/02/17 1955 08/03/17 0422  TROPONINI 0.03* 0.03*   ------------------------------------------------------------------------------------------------------------------  RADIOLOGY:  No results found.  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, hypertension, end-stage renal disease on hemodialysis, sleep apnea, history of seizure has been  on dialysis since 2011 comes to the emergency room brought in by mother after [facility] and has no dialysis chair as outpatient along with patient's family not able to care for him at home  *Acute severe aortic  mitral regurgitation Worsening noted Echocardiogram noted for ejection fraction 60-65%, significant worsening of severe aortic as well as mitral regurgitation, shunting cannot be fully assessed-Per cardiology patient is a poor candidate for invasive procedures/TEE/patient is not a surgical candidate, palliative care recommended going forward, chaplain consulted as well, advanced directives discussed with all questions answered Cardiology input greatly appreciated  *aspiration pneumonia Dysphasia diet Continue oral Augmentin  *ESRD Nephrology input appreciated, for hemodialysis later today with resumption of regular schedule on TTS  *Acute on chronic Hypotension  Stable  Echocardiogram noted  Continue midodrine which is at max dose Cortisol level normal  *History of CVA with chronic left-sided hemiplegia, functional quadriplegia/bedbound state Continue Coumadin and Provigil  *Gerd  continue PPI  *Left upper extremity swelling due to combination of his stroke and chronic thrombus - on Coumadin   Palliative care/chaplain services consulted given dismal prognosis  All the records are reviewed and case discussed with Care Management/Social Workerr. Management plans discussed with the patient, family and they are in agreement.  CODE STATUS: full  TOTAL TIME TAKING CARE OF THIS PATIENT: 35 minutes.     POSSIBLE D/C IN 1-3 DAYS, DEPENDING ON CLINICAL CONDITION.   Perry AsaMontell D [name] M.D on 08/07/2017   Between 7am to 6pm - Pager - [phone]  After 6pm go to [email or web address] - password [email or web address]  [facility]  Office  [phone]  CC: Primary care physician; [name], Perry T, MD  Note: This dictation was prepared with Dragon dictation along with smaller phrase technology. Any transcriptional errors that result from this process are unintentional.
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
## 2017-08-07 NOTE — Progress Notes (Signed)
Family Meeting Note  Advance Directive:yes  Today a meeting took place with the Patient, patient's mother.  Patient is able to participate   The following clinical team members were present during this meeting:MD  The following were discussed:Patient's diagnosis: Poor prognosis, severe aortic and mitral regurgitation, end-stage renal disease, CVA, Patient's progosis: Unable to determine and Goals for treatment: DNR  Additional follow-up to be provided: prn  Time spent during discussion:20 minutes  Bertrum SolMontell D Salary, MD

## 2017-08-07 NOTE — Progress Notes (Signed)
[facility]  ROUNDING NOTE   Subjective:   Patient seen during HD Tolerating well Currently in chair   HEMODIALYSIS FLOWSHEET:  Blood Flow Rate (mL/min): 400 mL/min Arterial Pressure (mmHg): -180 mmHg Venous Pressure (mmHg): 170 mmHg Transmembrane Pressure (mmHg): 70 mmHg Ultrafiltration Rate (mL/min): 660 mL/min Dialysate Flow Rate (mL/min): 600 ml/min Conductivity: Machine : 14 Conductivity: Machine : 14 Dialysis Fluid Bolus: Normal Saline Bolus Amount (mL): 250 mL    Objective:  Vital signs in last 24 hours:  Temp:  [97.9 F (36.6 C)-98.4 F (36.9 C)] 98 F (36.7 C) (07/22 0938) Pulse Rate:  [72-96] 80 (07/22 1100) Resp:  [10-26] 23 (07/22 1100) BP: (65-106)/(28-57) 88/31 (07/22 1100) SpO2:  [95 %-100 %] 100 % (07/22 1100)  Weight change:  Filed Weights   08/02/17 1945  Weight: 99.5 kg (219 lb 5.7 oz)    Intake/Output: No intake/output data recorded.   Intake/Output this shift:  No intake/output data recorded.  Physical Exam: General: No acute distress, chronically ill appearing   Head: Normocephalic, atraumatic. Moist oral mucosal membranes  Eyes: Anicteric  Neck: Supple,   Lungs:  Clear, normal breathing effort  Heart: regular  Abdomen:  Soft, nontender,    Extremities: No bilateral lower extremity edema, 2+ left upper extremity edema  Neurologic: Left sided weakness,      Access: L IJ permcath    Basic Metabolic Panel: Recent Labs  Lab 08/02/17 0411  08/02/17 1955 08/03/17 0422 08/05/17 0925 08/06/17 0710  NA  --   --  143 142 141 139  K  --   --  4.4 3.6 4.4 4.4  CL  --   --  102 104 102 101  CO2  --   --  32 31 27 29   GLUCOSE  --   --  98 92 83 91  BUN  --   --  23* 24* 18 23*  CREATININE  --   --  6.11* 6.47* 5.80* 6.95*  CALCIUM  --    < > 8.6* 8.6* 8.4* 8.4*  MG  --   --   --   --   --  3.0*  PHOS 2.1*  --   --   --   --  2.9   < > = values in this interval not displayed.    Liver Function Tests: Recent Labs   Lab 08/02/17 1955 08/06/17 0710  AST 25  --   ALT 33  --   ALKPHOS 85  --   BILITOT 0.9  --   PROT 6.0*  --   ALBUMIN 2.9* 2.8*   No results for input(s): LIPASE, AMYLASE in the last 168 hours. No results for input(s): AMMONIA in the last 168 hours.  CBC: Recent Labs  Lab 08/02/17 0411 08/02/17 1955 08/03/17 0422 08/05/17 0555 08/06/17 0710  WBC 4.3 4.1 3.6* 3.9 3.4*  NEUTROABS  --  2.7  --   --   --   HGB 12.4* 12.6* 12.4* 13.7 13.8  HCT 39.2* 40.5 39.0* 43.3 44.1  MCV 89.2 88.3 88.8 88.3 87.9  PLT 215 229 218 241 238    Cardiac Enzymes: Recent Labs  Lab 08/02/17 1955 08/03/17 0422  TROPONINI 0.03* 0.03*    BNP: Invalid input(s): POCBNP  CBG: Recent Labs  Lab 08/03/17 0739 08/04/17 0819 08/05/17 0734 08/06/17 0801 08/07/17 0737  GLUCAP 97 82 88 82 84    Microbiology: Results for orders placed or performed during the hospital encounter of 08/02/17  Culture,  blood (Routine x 2)     Status: None   Collection Time: 08/02/17  7:55 PM  Result Value Ref Range Status   Specimen Description BLOOD BLOOD RIGHT ARM  Final   Special Requests   Final    BOTTLES DRAWN AEROBIC AND ANAEROBIC Blood Culture adequate volume   Culture   Final    NO GROWTH 5 DAYS Performed at [facility], [address], [place], [place] [address]    Report Status 08/07/2017 FINAL  Final  Culture, blood (Routine x 2)     Status: None   Collection Time: 08/02/17  7:57 PM  Result Value Ref Range Status   Specimen Description BLOOD BLOOD RIGHT FOREARM  Final   Special Requests   Final    BOTTLES DRAWN AEROBIC AND ANAEROBIC Blood Culture adequate volume   Culture   Final    NO GROWTH 5 DAYS Performed at [facility], [address] [place], [place] [address]    Report Status 08/07/2017 FINAL  Final    Coagulation Studies: Recent Labs    08/05/17 0541 08/06/17 0710 08/07/17 1027  LABPROT 27.8* 34.2* 36.3*  INR 2.62 3.42 3.69    Urinalysis: No  results for input(s): COLORURINE, LABSPEC, PHURINE, GLUCOSEU, HGBUR, BILIRUBINUR, KETONESUR, PROTEINUR, UROBILINOGEN, NITRITE, LEUKOCYTESUR in the last 72 hours.  Invalid input(s): APPERANCEUR    Imaging: No results found.   Medications:    . amoxicillin-clavulanate  1 tablet Oral Q12H  . aspirin  81 mg Oral Daily  . calcium acetate  2,001 mg Oral TID WC  . Chlorhexidine Gluconate Cloth  6 each Topical Q0600  . docusate sodium  100 mg Oral BID  . escitalopram  20 mg Oral QHS  . gabapentin  300 mg Oral QHS  . loratadine  10 mg Oral Daily  . midodrine  15 mg Oral Q8H  . multivitamin with minerals  1 tablet Oral Daily  . pantoprazole  40 mg Oral Daily   acetaminophen **OR** acetaminophen, alprazolam, bisacodyl, HYDROcodone-acetaminophen, ondansetron **OR** ondansetron (ZOFRAN) IV, polyethylene glycol, traMADol  Assessment/ Plan:  47 y.o. black male with End-stage renal disease on hemodialysis MWF followed by [facility], chronic back pain, GERD, hypertension, seizure disorder, obstructive sleep apnea, anemia chronic kidney disease, secondary hyperparathyroidism, anxiety who is now admitted for reestablishing outpt dialysis in [facility].   1.  ESRD on HD : Last dialysis treatment was Friday, 7/19.   - Outpatient planning for TTS [facility] [address] at 11:20am followed by [facility]) HD today then may resume normal schedule.  2.  Anemia of chronic kidney disease.  Hemoglobin 13.8.  No indication for ESA  3.  Secondary hyperparathyroidism.  Phosphorus and calcium at goal.  PTH at 132 - Calcium acetate with meals - Holding cinacalcet  4.  Hypotension.    - midodrine 15 mg p.o. every 8 hours. - Limited UF as tolerated   LOS: 5 Schylar Wuebker 7/22/201911:58 AM

## 2017-08-07 NOTE — Progress Notes (Signed)
Chaplain responded to an OR for an AD and Palliative care. Pt thought Chaplain came in to rush him into death. He said "that why they send the chaplain by". Chaplain lead a discussion on the good and bad of death. Pt said death may not be bad. Chaplain offered agreement as we don't know. Chaplain introduced the subject of  Full Code vs DNR. Pt is struggling with getting resituated and DNR. Chaplain explained the roles of HCPOA and LW. Pt said I don't want to be hooked to machines. He compared being on the machines with [facility] [facility]. Pt has 2 children 9219 and 46 years old.  Pt is willing to discuss his end of life but he become teary about leaving his loved ones.Patiative care member came into the room for further discussion. Chaplain prayed a prayer for comfort of family and Pt also for the care team.        08/07/17 1400  Clinical Encounter Type  Visited With Patient and family together  Visit Type Initial  Referral From Physician  Spiritual Encounters  Spiritual Needs Brochure;Prayer

## 2017-08-07 NOTE — Progress Notes (Signed)
PHARMACY NOTE:  ANTIMICROBIAL RENAL DOSAGE ADJUSTMENT  Current antimicrobial regimen includes a mismatch between antimicrobial dosage and estimated renal function.  As per policy approved by the Pharmacy & Therapeutics and Medical Executive Committees, the antimicrobial dosage will be adjusted accordingly.  Current antimicrobial dosage:  Amoxicillin-clavulanate (Augmentin)  500-125mg  PO Q12H  Indication: Aspiration Pneumonia  Renal Function:  Estimated Creatinine Clearance: 14.7 mL/min (A) (by C-G formula based on SCr of 6.95 mg/dL (H)). [x]      On intermittent HD, scheduled: MWF transitioning to TTS []      On CRRT    Antimicrobial dosage has been changed to:  Amoxicillin-clavulanate (Augmentin) 500-125mg  PO Q24H.  Additional comments:   Thank you for allowing pharmacy to be a part of this patient's care.  Marty HeckWang, Graciemae Delisle L, [facility] HospitalRPH 08/07/2017 12:51 PM

## 2017-08-07 NOTE — Progress Notes (Signed)
HD Tx initiated w/o complication   08/07/17 0938  Vital Signs  Temp 98 F (36.7 C)  Temp Source Oral  Pulse Rate 96  Resp (!) 21  BP (!) 100/33  BP Location Right Arm  BP Method Automatic  Patient Position (if appropriate) Lying  Oxygen Therapy  SpO2 100 %  O2 Device Room Air  Pain Assessment  Pain Scale 0-10  Pain Score 0  Dialysis Weight  Weight  (unable to weigh in cahir)  Time-Out for Hemodialysis  What Procedure? HD   Pt Identifiers(min of two) First/Last Name;MRN/Account#  Correct Site? Yes  Correct Side? Yes  Correct Procedure? Yes  Consents Verified? Yes  Rad Studies Available? N/A  Safety Precautions Reviewed? Yes  [employer] Checks  Machine Number [id number]  Station Number 1  UF/Alarm Test Passed  Conductivity: Meter 14  Conductivity: Machine  14  pH 7.4  Reverse Osmosis Main  Normal Saline Lot Number [id number]  Dialyzer Lot Number [id number]  Disposable Set Lot Number [id number]  Air Detector Armed and Audible Yes  Blood Lines Intact and Secured Yes  Pre Treatment Patient Checks  Vascular access used during treatment Catheter  Patient is receiving dialysis in a chair Yes  Hepatitis B Surface Antigen Results Negative  Date Hepatitis B Surface Antigen Drawn  (07/27/2017)  Hepatitis B Surface Antibody  (<10)  Date Hepatitis B Surface Antibody Drawn 07/27/17  Hemodialysis Consent Verified Yes  Hemodialysis Standing Orders Initiated Yes  ECG (Telemetry) Monitor On Yes  Prime Ordered Normal Saline  Length of  DialysisTreatment -hour(s) 3.5 Hour(s)  Dialysis Treatment Comments Na 140  Dialyzer Elisio 17H NR  Dialysate 3K, 2.5 Ca  Dialysis Anticoagulant None  Dialysate Flow Ordered 600  Blood Flow Rate Ordered 400 mL/min  Ultrafiltration Goal 1.5 Liters  Pre Treatment Labs Renal panel;CBC;PT;PTT  Dialysis Blood Pressure Support Ordered Normal Saline  During Hemodialysis Assessment  Blood Flow Rate (mL/min) 400 mL/min  Arterial Pressure (mmHg) -180 mmHg  Venous  Pressure (mmHg) 170 mmHg  Transmembrane Pressure (mmHg) 70 mmHg  Ultrafiltration Rate (mL/min) 660 mL/min  Dialysate Flow Rate (mL/min) 600 ml/min  Conductivity: Machine  14  HD Safety Checks Performed Yes  Dialysis Fluid Bolus Normal Saline  Bolus Amount (mL) 250 mL  Intra-Hemodialysis Comments Tx initiated  Education / Care Plan  Dialysis Education Provided Yes  Documented Education in Care Plan Yes  Hemodialysis Catheter Left Subclavian Double-lumen  No Placement Date or Time found.   Orientation: Left  Access Location: Subclavian  Hemodialysis Catheter Type: Double-lumen  Site Condition No complications  Blue Lumen Status Flushed  Red Lumen Status Flushed

## 2017-08-07 NOTE — Progress Notes (Signed)
HD TX ended, pt hypotensive but asymptomatic, pt denies complaints, BP recovered once blood returned.

## 2017-08-07 NOTE — Progress Notes (Signed)
ANTICOAGULATION CONSULT NOTE - Initial Consult  Pharmacy Consult for warfarin dosing Indication: stroke  Allergies  Allergen Reactions  . Depakote [Divalproex Sodium] Other (See Comments)    Hallucinations     Patient Measurements: Height: 5\' 6"  (167.6 cm) Weight: (unable to weigh in cahir) IBW/kg (Calculated) : 63.8  Vital Signs: Temp: 98 F (36.7 C) (07/22 0938) Temp Source: Oral (07/22 0938) BP: 90/29 (07/22 1200) Pulse Rate: 85 (07/22 1115)  Labs: Recent Labs    08/05/17 0541 08/05/17 0555 08/05/17 0925 08/06/17 0710 08/07/17 1027  HGB  --  13.7  --  13.8  --   HCT  --  43.3  --  44.1  --   PLT  --  241  --  238  --   LABPROT 27.8*  --   --  34.2* 36.3*  INR 2.62  --   --  3.42 3.69  CREATININE  --   --  5.80* 6.95*  --     Estimated Creatinine Clearance: 14.7 mL/min (A) (by C-G formula based on SCr of 6.95 mg/dL (H)).  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  Warfarin 4mg  daily PTA.              INR    Dose 7/15   2.68    3 mg 7/16   2.30    3 mg 7/17   2.30    4 mg 7/18   2.01    4 mg 7/19     ---     4 mg 7/20   2.62    4 mg 7/21   3.42 -- 7/22   3.69  Goal of Therapy:  INR 2-3 Monitor platelets by anticoagulation protocol: Yes   Plan:  Will hold warfarin again today as INR is supratherapeutic. Will monitor INR daily and restart when appropriate.   Marty HeckWang, Ashle Stief L, RPH 08/07/2017,12:35 PM

## 2017-08-07 NOTE — Progress Notes (Signed)
Post HD Tx, pt tolerated tx, Pt TTS, this is ex treatment, UF achieved.

## 2017-08-07 NOTE — Progress Notes (Signed)
Post HD assessment, pt has some confusion.    08/07/17 1239  Neurological  Level of Consciousness Responds to Voice  Orientation Level Oriented to person;Oriented to place;Oriented to situation  Respiratory  Respiratory Pattern Regular  Chest Assessment Chest expansion symmetrical  Bilateral Breath Sounds Diminished  R Upper  Breath Sounds Diminished  Cardiac  Pulse Regular  Heart Sounds S1, S2  ECG Monitor Yes  Vascular  R Radial Pulse +1  L Radial Pulse +1  Psychosocial  Psychosocial (WDL) WDL  Patient Behaviors Calm;Cooperative

## 2017-08-07 NOTE — Progress Notes (Signed)
Progress Note  Patient Name: Perry [name] Date of Encounter: 08/07/2017  Primary Cardiologist: Julien Nordmann, MD  Subjective   Seen in HD.  BP's cont to trend low - 70's to 90's.  No chest pain, dyspnea, presyncope currently.  Inpatient Medications    Scheduled Meds: . amoxicillin-clavulanate  1 tablet Oral Q12H  . aspirin  81 mg Oral Daily  . calcium acetate  2,001 mg Oral TID WC  . Chlorhexidine Gluconate Cloth  6 each Topical Q0600  . docusate sodium  100 mg Oral BID  . escitalopram  20 mg Oral QHS  . gabapentin  300 mg Oral QHS  . loratadine  10 mg Oral Daily  . midodrine  15 mg Oral Q8H  . multivitamin with minerals  1 tablet Oral Daily  . pantoprazole  40 mg Oral Daily   Continuous Infusions:  PRN Meds: acetaminophen **OR** acetaminophen, alprazolam, bisacodyl, HYDROcodone-acetaminophen, ondansetron **OR** ondansetron (ZOFRAN) IV, polyethylene glycol, traMADol   Vital Signs    Vitals:   08/06/17 1524 08/06/17 2012 08/07/17 0436 08/07/17 0530  BP: (!) 105/28 (!) 76/57 (!) 92/33 (!) 106/46  Pulse: 85 72 77   Resp:  20 20   Temp:  98.4 F (36.9 C) 97.9 F (36.6 C)   TempSrc:  Oral Oral   SpO2:  95% 96% 97%  Weight:      Height:        Intake/Output Summary (Last 24 hours) at 08/07/2017 1044 Last data filed at 08/07/2017 0757 Gross per 24 hour  Intake -  Output 0 ml  Net 0 ml   Filed Weights   08/02/17 1945  Weight: 219 lb 5.7 oz (99.5 kg)    Physical Exam   GEN: Obese, no acute distress.  HEENT: Grossly normal.  Neck: Supple, obese, difficult to gauge jvp. No carotid bruits, or masses. Cardiac: RRR, 2/6 SEM and diast murmur loudest @ LUSB but heard throughout.  No rubs, or gallops. No clubbing, cyanosis, edema.  Radials/DP/PT 2+ and equal bilaterally.  Respiratory:  Respirations regular and unlabored, clear to auscultation bilaterally. GI: Soft, obese, nontender, nondistended, BS + x 4. MS: no deformity or atrophy. Skin: warm and dry, no  rash. Neuro:  L hemiparesis. Psych: AAOx3.  Flat affect.  Labs    Chemistry Recent Labs  Lab 08/02/17 1955 08/03/17 0422 08/05/17 0925 08/06/17 0710  NA 143 142 141 139  K 4.4 3.6 4.4 4.4  CL 102 104 102 101  CO2 32 31 27 29   GLUCOSE 98 92 83 91  BUN 23* 24* 18 23*  CREATININE 6.11* 6.47* 5.80* 6.95*  CALCIUM 8.6* 8.6* 8.4* 8.4*  PROT 6.0*  --   --   --   ALBUMIN 2.9*  --   --  2.8*  AST 25  --   --   --   ALT 33  --   --   --   ALKPHOS 85  --   --   --   BILITOT 0.9  --   --   --   GFRNONAA 10* 9* 11* 8*  GFRAA 12* 11* 12* 10*  ANIONGAP 9 7 12 9      Hematology Recent Labs  Lab 08/03/17 0422 08/05/17 0555 08/06/17 0710  WBC 3.6* 3.9 3.4*  RBC 4.39* 4.91 5.02  HGB 12.4* 13.7 13.8  HCT 39.0* 43.3 44.1  MCV 88.8 88.3 87.9  MCH 28.1 28.0 27.6  MCHC 31.7* 31.7* 31.3*  RDW 20.1* 19.9* 19.8*  PLT 218  241 238    Cardiac Enzymes Recent Labs  Lab 08/02/17 1955 08/03/17 0422  TROPONINI 0.03* 0.03*     Lab Results  Component Value Date   INR 3.69 08/07/2017   INR 3.42 08/06/2017   INR 2.62 08/05/2017    Radiology    No results found.  Telemetry    RSR, rare pvc's - Personally Reviewed  Cardiac Studies   Transesophageal Echocardiogram 4.16.2019 [facility])  FINDINGS  Left Ventricle Mild to moderate concentric left ventricular hypertrophy. Normal left ventricular ejection fraction estimated  at 55-60%.  Right Ventricle Normal right ventricular size and function.  Right Atrium Normal right atrial size.  Left Atrium Normal left atrial size.  LA Appendage Normal left atrial appendage. No thrombus detected in the left atrial appendage.  IA Septum Normal interatrial septum.  Mitral Valve Structurally normal mitral valve. Trace to mild mitral regurgitation. No vegetations seen.  Aortic Valve Trileaflet aortic valve. Diffuse thickening (sclerosis) of the aortic valve cusps without reduced excursion.  Moderate aortic regurgitation. No vegetations seen.   Tricuspid Valve Structurally normal tricuspid valve. No vegetations seen.  Pulmonic Valve Structurally normal pulmonic valve. No vegetations seen.  Pericardium No pericardial effusion  Vessels Normal aorta with minimal atheroma. Normal pulmonary vein inflow. _____________   2D Echocardiogram 7.21.2019  Study Conclusions  - Left ventricle: The cavity size was moderately dilated. Systolic   function was normal. The estimated ejection fraction was in the   range of 60% to 65%. Wall motion was normal; there were no   regional wall motion abnormalities. Features are consistent with   a pseudonormal left ventricular filling pattern, with concomitant   abnormal relaxation and increased filling pressure (grade 2   diastolic dysfunction). - Aortic valve: There was severe regurgitation. - Mitral valve: There was moderate to severe regurgitation. - Left atrium: The atrium was mildly to moderately dilated. - Right ventricle: Systolic function was moderately reduced. - Right atrium: The atrium was moderately dilated. - Tricuspid valve: There was moderate regurgitation. - Pulmonary arteries: Systolic pressure was moderately elevated PA   peak pressure: 46 mm Hg (S).  Impressions:  - Left pleural effusion. Significant aortic and mitral valve   regurgitation, unable to exclude shunt from atrial septal wall   abnormality.   Consider a TEE for further evaluation of the mitral and aortic   valves, rule out septal defect. _____________  Patient Profile     47 y.o. 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  Assessment & Plan    1.  Severe Aortic Insufficiency/Moderate to severe Mitral Regurgitation: Echo 7/21 with significant worsening of valvular heart disease since TEE in 04/2017 @ [facility].  ? Role of volume  overload vs worsening valve pathology.  Prior TEE done while intubated. Poor candidate for repeat TEE given hypotension and risk of aspiration.  Cont HD as tolerated - currently in HD - bp 80's to 90's.  Rec ongoing conservative mgmt for now w/ plan to re-image @ later date when euvolemic.    2.  Hypotension: BP 106/46 this AM. Currently 80's to 90's in HD. Cont midodrine.  3.  ESRD:  HD per nephrology.  4.  CVA w/ L hemiparesis: Full care and high risk for aspiration.  Family apparently could not take care of him @ home.  Will need to be  eval for long term care facility. With multiple chronic issues, consider palliative care consult.  5.  Elevated troponin:  Minimal elevation @ 0.03 w/ flat trend.  No chest pain.  Suspect demand ischemia in the setting of volume excess and hypotension.  6.  L IJ DVT; Antiphospholipid Antibody: Dx in June. On coumadin @ home - pharmacy following.  INR supratherapeutic @ 3.69.  Signed, Nicolasa Duckinghristopher Niya Behler, NP  08/07/2017, 10:44 AM    For questions or updates, please contact   Please consult [email or web address] for contact info under Cardiology/STEMI.

## 2017-08-07 NOTE — Progress Notes (Signed)
Pt is asymptomatic, hypotensive, MD aware, this is pt's baseline    08/07/17 0938  Vital Signs  Temp 98 F (36.7 C)  Temp Source Oral  Pulse Rate 96  Resp (!) 21  BP (!) 100/33  BP Location Right Arm  BP Method Automatic  Patient Position (if appropriate) Lying  Oxygen Therapy  SpO2 100 %  O2 Device Room Air  Pain Assessment  Pain Scale 0-10  Pain Score 0  Dialysis Weight  Weight  (unable to weigh in cahir)  Time-Out for Hemodialysis  What Procedure? HD   Pt Identifiers(min of two) First/Last Name;MRN/Account#  Correct Site? Yes  Correct Side? Yes  Correct Procedure? Yes  Consents Verified? Yes  Rad Studies Available? N/A  Safety Precautions Reviewed? Yes  [employer] Checks  Machine Number [id number]  Station Number 1  UF/Alarm Test Passed  Conductivity: Meter 14  Conductivity: Machine  14  pH 7.4  Reverse Osmosis Main  Normal Saline Lot Number [id number]  Dialyzer Lot Number [id number]  Disposable Set Lot Number [id number]  Air Detector Armed and Audible Yes  Blood Lines Intact and Secured Yes  Pre Treatment Patient Checks  Vascular access used during treatment Catheter  Patient is receiving dialysis in a chair Yes  Hepatitis B Surface Antigen Results Negative  Date Hepatitis B Surface Antigen Drawn  (07/27/2017)  Hepatitis B Surface Antibody  (<10)  Date Hepatitis B Surface Antibody Drawn 07/27/17  Hemodialysis Consent Verified Yes  Hemodialysis Standing Orders Initiated Yes  ECG (Telemetry) Monitor On Yes  Prime Ordered Normal Saline  Length of  DialysisTreatment -hour(s) 3.5 Hour(s)  Dialysis Treatment Comments Na 140  Dialyzer Elisio 17H NR  Dialysate 3K, 2.5 Ca  Dialysis Anticoagulant None  Dialysate Flow Ordered 600  Blood Flow Rate Ordered 400 mL/min  Ultrafiltration Goal 1.5 Liters  Pre Treatment Labs Renal panel;CBC;PT;PTT  Dialysis Blood Pressure Support Ordered Normal Saline  During Hemodialysis Assessment  Blood Flow Rate (mL/min) 400 mL/min  Arterial  Pressure (mmHg) -180 mmHg  Venous Pressure (mmHg) 170 mmHg  Transmembrane Pressure (mmHg) 70 mmHg  Ultrafiltration Rate (mL/min) 660 mL/min  Dialysate Flow Rate (mL/min) 600 ml/min  Conductivity: Machine  14  HD Safety Checks Performed Yes  Dialysis Fluid Bolus Normal Saline  Bolus Amount (mL) 250 mL  Intra-Hemodialysis Comments Tx initiated  Education / Care Plan  Dialysis Education Provided Yes  Documented Education in Care Plan Yes  Hemodialysis Catheter Left Subclavian Double-lumen  No Placement Date or Time found.   Orientation: Left  Access Location: Subclavian  Hemodialysis Catheter Type: Double-lumen  Site Condition No complications  Blue Lumen Status Flushed  Red Lumen Status Flushed

## 2017-08-07 NOTE — Progress Notes (Signed)
Nutrition Education Note  RD drawn to patient for low braden. He has been eating ok, normally eats small meals every 2 hours. Weight stable. Patient has a history of gastric [facility] but states he has not been taking the appropriate vitamins to prevent deficiencies. Provided him with the [facility] Bariatric Vitamin and Mineral Recommendations. Discussed importance of taking Bariatric Multivitamin with iron, calcium, and vitamin D. Emphasized preventing nutrient deficiencies.  Expect good compliance.  Dionne AnoWilliam M. Dondi Burandt, MS, RD LDN Inpatient Clinical Dietitian Pager [phone]

## 2017-08-07 NOTE — Progress Notes (Signed)
Pre HD Assessment    08/07/17 0930  Neurological  Level of Consciousness Responds to Voice  Orientation Level Oriented to person;Oriented to place;Oriented to situation  Respiratory  Respiratory Pattern Regular  Chest Assessment Chest expansion symmetrical  Bilateral Breath Sounds Diminished  R Upper  Breath Sounds Diminished  Cardiac  Pulse Regular  Heart Sounds S1, S2  ECG Monitor Yes  Vascular  R Radial Pulse +1  L Radial Pulse +1  Psychosocial  Psychosocial (WDL) WDL  Patient Behaviors Calm;Cooperative

## 2017-08-08 DIAGNOSIS — R443 Hallucinations, unspecified: Secondary | ICD-10-CM

## 2017-08-08 LAB — GLUCOSE, CAPILLARY
Glucose-Capillary: 63 mg/dL — ABNORMAL LOW (ref 70–99)
Glucose-Capillary: 95 mg/dL (ref 70–99)

## 2017-08-08 LAB — PROTIME-INR
INR: 2.69
Prothrombin Time: 28.4 seconds — ABNORMAL HIGH (ref 11.4–15.2)

## 2017-08-08 MED ORDER — QUETIAPINE FUMARATE 25 MG PO TABS: 25.0000 mg | ORAL_TABLET | Freq: Every day | ORAL | Status: DC

## 2017-08-08 MED ORDER — RENA-VITE PO TABS
1.0000 | ORAL_TABLET | Freq: Every day | ORAL | Status: DC
  Administered 2017-08-09 – 2017-08-10 (×3): 1 via ORAL
  Filled 2017-08-08 (×3): qty 1

## 2017-08-08 MED ORDER — OLANZAPINE 5 MG PO TABS: 5.0000 mg | ORAL_TABLET | Freq: Every day | ORAL | Status: DC

## 2017-08-08 MED ORDER — WARFARIN SODIUM 3 MG PO TABS
3.0000 mg | ORAL_TABLET | Freq: Once | ORAL | Status: AC
  Administered 2017-08-08: 3 mg via ORAL
  Filled 2017-08-08: qty 1

## 2017-08-08 NOTE — Progress Notes (Signed)
[employer] [facility] [place] [facility]   PATIENT NAME: Perry [name]    MR#:  [id number]  DATE OF BIRTH:  29-May-1970  SUBJECTIVE:  CHIEF COMPLAINT:   Chief Complaint  Patient presents with  . Hypotension  Patient noted to be obtunded this morning, opens eyes but drifts off to sleep, case discussed with nephrology as well as the patient's family and brother over the phone, family is currently interested in continue hospice/palliative care at home, the question is whether or not to continue hemodialysis on an outpatient basis versus home hemodialysis  REVIEW OF SYSTEMS:  CONSTITUTIONAL: No fever, fatigue or weakness.  EYES: No blurred or double vision.  EARS, NOSE, AND THROAT: No tinnitus or ear pain.  RESPIRATORY: No cough, shortness of breath, wheezing or hemoptysis.  CARDIOVASCULAR: No chest pain, orthopnea, edema.  GASTROINTESTINAL: No nausea, vomiting, diarrhea or abdominal pain.  GENITOURINARY: No dysuria, hematuria.  ENDOCRINE: No polyuria, nocturia,  HEMATOLOGY: No anemia, easy bruising or bleeding SKIN: No rash or lesion. MUSCULOSKELETAL: No joint pain or arthritis.   NEUROLOGIC: No tingling, numbness, weakness.  PSYCHIATRY: No anxiety or depression.   ROS  DRUG ALLERGIES:   Allergies  Allergen Reactions  . Depakote [Divalproex Sodium] Other (See Comments)    Hallucinations     VITALS:  Blood pressure (!) 94/41, pulse 88, temperature 97.9 F (36.6 C), temperature source Oral, resp. rate (!) 24, height 5\' 6"  (1.676 m), weight 99.5 kg (219 lb 5.7 oz), SpO2 99 %.  PHYSICAL EXAMINATION:  GENERAL:  47 y.o.-year-old patient lying in the bed with no acute distress.  EYES: Pupils equal, round, reactive to light and accommodation. No scleral icterus. Extraocular muscles intact.  HEENT: Head atraumatic, normocephalic. Oropharynx and nasopharynx clear.  NECK:  Supple, no jugular venous distention. No thyroid enlargement, no tenderness.  LUNGS: Normal breath  sounds bilaterally, no wheezing, rales,rhonchi or crepitation. No use of accessory muscles of respiration.  CARDIOVASCULAR: S1, S2 normal. No murmurs, rubs, or gallops.  ABDOMEN: Soft, nontender, nondistended. Bowel sounds present. No organomegaly or mass.  EXTREMITIES: No pedal edema, cyanosis, or clubbing.  NEUROLOGIC: Cranial nerves II through XII are intact. Muscle strength 5/5 in all extremities. Sensation intact. Gait not checked.  PSYCHIATRIC: The patient is alert and oriented x 3.  SKIN: No obvious rash, lesion, or ulcer.   Physical Exam LABORATORY PANEL:   CBC Recent Labs  Lab 08/06/17 0710  WBC 3.4*  HGB 13.8  HCT 44.1  PLT 238   ------------------------------------------------------------------------------------------------------------------  Chemistries  Recent Labs  Lab 08/02/17 1955  08/06/17 0710  NA 143   < > 139  K 4.4   < > 4.4  CL 102   < > 101  CO2 32   < > 29  GLUCOSE 98   < > 91  BUN 23*   < > 23*  CREATININE 6.11*   < > 6.95*  CALCIUM 8.6*   < > 8.4*  MG  --   --  3.0*  AST 25  --   --   ALT 33  --   --   ALKPHOS 85  --   --   BILITOT 0.9  --   --    < > = values in this interval not displayed.   ------------------------------------------------------------------------------------------------------------------  Cardiac Enzymes Recent Labs  Lab 08/02/17 1955 08/03/17 0422  TROPONINI 0.03* 0.03*   ------------------------------------------------------------------------------------------------------------------  RADIOLOGY:  No results found.  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, hypertension, end-stage renal disease on hemodialysis, sleep apnea, history of seizure has been on dialysis since 2011 comes to the emergency room brought in by mother after patient was [facility] few days ago and has no dialysis chair as  outpatient along with patient's family not able to care for him at home  *Acute toxic metabolic encephalopathy Secondary to medication side effect  Hold psychotropic meds, aspiration/fall precautions, neurochecks per routine  *Acute severe aortic mitral regurgitation Worsening noted Echocardiogram noted for ejection fraction 60-65%, significant worsening of severe aortic as well as mitral regurgitation, shunting cannot be fully assessed-Per cardiology patient is a poor candidate for invasive procedures/TEE/patient is not a surgical candidate, palliative care recommended-there input is greatly appreciated, chaplain services input appreciated, family is interested in hospice/palliative care at home Cardiology input greatly appreciated  *aspiration pneumonia Dysphasia diet Continue oral Augmentin  *ESRD Nephrology input appreciated for hemodialysis TTS Family discussion had-family is considering whether to continue dialysis versus home dialysis status post discharge  *Acute on chronicHypotension  Stable  Echocardiogram noted  Continue midodrine which is at max dose Cortisol level normal  *History of CVA with chronic left-sided hemiplegia, functional quadriplegia/bedbound state Continue Coumadin and Provigil  *Gerd  continue PPI  *Left upper extremity swelling due to combination of his stroke and chronic thrombus - on Coumadin   Palliative care/chaplain services consulted given dismal prognosis-family considering home hospice versus palliative care going forward, possible discharge in 1 to 2 days once decision has been made  All the records are reviewed and case discussed with Care Management/Social Workerr. Management plans discussed with the patient, family and they are in agreement.  CODE STATUS: dnr  TOTAL TIME TAKING CARE OF THIS PATIENT: 35 minutes.     POSSIBLE D/C IN 1-2 DAYS, DEPENDING ON CLINICAL CONDITION.   Evelena Asa Tondra Reierson M.D on 08/08/2017    Between 7am to 6pm - Pager - [phone]  After 6pm go to [email or web address] - password [email or web address]  [facility] [facility]  Office  [phone]  CC: Primary care physician; Donita Brooks, MD  Note: This dictation was prepared with Dragon dictation along with smaller phrase technology. Any transcriptional errors that result from this process are unintentional.

## 2017-08-08 NOTE — Progress Notes (Signed)
[facility]  ROUNDING NOTE   Subjective:   Patient could not be discharged yesterday because of overnight episode of confusion and hallucination Denies hallucinations this morning Able to take his medications No nausea or vomiting reported  Objective:  Vital signs in last 24 hours:  Temp:  [97.9 F (36.6 C)] 97.9 F (36.6 C) (07/23 0428) Pulse Rate:  [84-95] 88 (07/23 1203) Resp:  [16-24] 24 (07/23 1203) BP: (94-120)/(35-41) 94/41 (07/23 1203) SpO2:  [96 %-99 %] 99 % (07/23 1203)  Weight change:  Filed Weights   08/02/17 1945  Weight: 99.5 kg (219 lb 5.7 oz)    Intake/Output: I/O last 3 completed shifts: In: 296 [P.O.:296] Out: 808 [Other:808]   Intake/Output this shift:  Total I/O In: 118 [P.O.:118] Out: -   Physical Exam: General: No acute distress, chronically ill appearing   Head: Normocephalic, atraumatic. Moist oral mucosal membranes  Eyes: Anicteric  Neck: Supple,   Lungs:  Clear, normal breathing effort  Heart: regular  Abdomen:  Soft, nontender,    Extremities: trace lower extremity edema, left upper extremity edema  Neurologic: Left sided weakness, alert, able to answer questions, slow to respond, oriented to time and place     Access: L IJ permcath    Basic Metabolic Panel: Recent Labs  Lab 08/02/17 0411  08/02/17 1955 08/03/17 0422 08/05/17 0925 08/06/17 0710  NA  --   --  143 142 141 139  K  --   --  4.4 3.6 4.4 4.4  CL  --   --  102 104 102 101  CO2  --   --  32 31 27 29   GLUCOSE  --   --  98 92 83 91  BUN  --   --  23* 24* 18 23*  CREATININE  --   --  6.11* 6.47* 5.80* 6.95*  CALCIUM  --    < > 8.6* 8.6* 8.4* 8.4*  MG  --   --   --   --   --  3.0*  PHOS 2.1*  --   --   --   --  2.9   < > = values in this interval not displayed.    Liver Function Tests: Recent Labs  Lab 08/02/17 1955 08/06/17 0710  AST 25  --   ALT 33  --   ALKPHOS 85  --   BILITOT 0.9  --   PROT 6.0*  --   ALBUMIN 2.9* 2.8*   No results  for input(s): LIPASE, AMYLASE in the last 168 hours. No results for input(s): AMMONIA in the last 168 hours.  CBC: Recent Labs  Lab 08/02/17 0411 08/02/17 1955 08/03/17 0422 08/05/17 0555 08/06/17 0710  WBC 4.3 4.1 3.6* 3.9 3.4*  NEUTROABS  --  2.7  --   --   --   HGB 12.4* 12.6* 12.4* 13.7 13.8  HCT 39.2* 40.5 39.0* 43.3 44.1  MCV 89.2 88.3 88.8 88.3 87.9  PLT 215 229 218 241 238    Cardiac Enzymes: Recent Labs  Lab 08/02/17 1955 08/03/17 0422  TROPONINI 0.03* 0.03*    BNP: Invalid input(s): POCBNP  CBG: Recent Labs  Lab 08/05/17 0734 08/06/17 0801 08/07/17 0737 08/08/17 0730 08/08/17 0826  GLUCAP 88 82 84 63* 95    Microbiology: Results for orders placed or performed during the hospital encounter of 08/02/17  Culture, blood (Routine x 2)     Status: None   Collection Time: 08/02/17  7:55 PM  Result  Value Ref Range Status   Specimen Description BLOOD BLOOD RIGHT ARM  Final   Special Requests   Final    BOTTLES DRAWN AEROBIC AND ANAEROBIC Blood Culture adequate volume   Culture   Final    NO GROWTH 5 DAYS Performed at [facility], [address] [place], [place] [address]    Report Status 08/07/2017 FINAL  Final  Culture, blood (Routine x 2)     Status: None   Collection Time: 08/02/17  7:57 PM  Result Value Ref Range Status   Specimen Description BLOOD BLOOD RIGHT FOREARM  Final   Special Requests   Final    BOTTLES DRAWN AEROBIC AND ANAEROBIC Blood Culture adequate volume   Culture   Final    NO GROWTH 5 DAYS Performed at [facility], [address] [place], [place] [address]    Report Status 08/07/2017 FINAL  Final    Coagulation Studies: Recent Labs    08/06/17 0710 08/07/17 1027 08/08/17 1301  LABPROT 34.2* 36.3* 28.4*  INR 3.42 3.69 2.69    Urinalysis: No results for input(s): COLORURINE, LABSPEC, PHURINE, GLUCOSEU, HGBUR, BILIRUBINUR, KETONESUR, PROTEINUR, UROBILINOGEN, NITRITE, LEUKOCYTESUR in the last 72  hours.  Invalid input(s): APPERANCEUR    Imaging: No results found.   Medications:    . amoxicillin-clavulanate  1 tablet Oral Q24H  . aspirin  81 mg Oral Daily  . calcium acetate  2,001 mg Oral TID WC  . Chlorhexidine Gluconate Cloth  6 each Topical Q0600  . docusate sodium  100 mg Oral BID  . loratadine  10 mg Oral Daily  . midodrine  15 mg Oral Q8H  . multivitamin  1 tablet Oral QHS  . multivitamin with minerals  1 tablet Oral Daily  . pantoprazole  40 mg Oral Daily  . Warfarin - Pharmacist Dosing Inpatient   Does not apply q1800   acetaminophen **OR** acetaminophen, bisacodyl, ondansetron **OR** ondansetron (ZOFRAN) IV, polyethylene glycol  Assessment/ Plan:  47 y.o. black male with End-stage renal disease on hemodialysis MWF [facility], chronic back pain, GERD, hypertension, seizure disorder, obstructive sleep apnea, anemia chronic kidney disease, secondary hyperparathyroidism, anxiety who is now admitted for reestablishing [facility]pt dialysis [facility].   1.  ESRD on HD : Last dialysis treatment was Friday, 7/19.   - [facility] [address] [facility] by [facility] Routine hemodialysis on Saturday to align with outpatient schedule  2.  Anemia of chronic kidney disease.  Hemoglobin 13.8.  No indication for ESA  3.  Secondary hyperparathyroidism.  Phosphorus and calcium at goal.  PTH at 132 - Calcium acetate with meals - Holding cinacalcet  4.  Hypotension.    - midodrine 15 mg p.o. every 8 hours. - Limited UF as tolerated  5.  Altered mental status ?  Related to pain medications, psychoactive agents Agree with discontinuing narcotics.  Pain control with nonsteroidals/Tylenol as needed   LOS: 6 Jaymond Waage 7/23/20192:26 PM

## 2017-08-08 NOTE — Progress Notes (Signed)
Daily Progress Note   Patient Name: Perry [name]       Date: 08/08/2017 DOB: February 19, 1970  Age: 47 y.o. MRN#: [id number] Attending Physician: Bertrum SolSalary, Montell D, MD Primary Care Physician: [name], [name], MD Admit Date: 08/02/2017  Reason for Consultation/Follow-up: Establishing goals of care  Subjective: Patient in bed being fed by Mom. Eating well.  C/O hallucinations. He is confused a little today. Yesterday thought he had a snake on his back and was very freightened. [place] in [place]. Mom reporting he is seeing people who aren't there. Mom states he had these hallucinations previously as well since his stroke.  We discussed his poor state of health and limited ability for functional recovery. He and Mom continue to be hopeful for him to return home for some time and continue dialysis as long as he is physically able to tolerate it.  Per discussion with Dr. Katheren ShamsSalary yesterday he was made DNR status.   Review of Systems  Constitutional: Positive for malaise/fatigue and weight loss.  Cardiovascular: Negative for chest pain.  Psychiatric/Behavioral: Positive for hallucinations. Negative for depression and suicidal ideas.    Length of Stay: 6  Current Medications: Scheduled Meds:  . amoxicillin-clavulanate  1 tablet Oral Q24H  . aspirin  81 mg Oral Daily  . calcium acetate  2,001 mg Oral TID WC  . Chlorhexidine Gluconate Cloth  6 each Topical Q0600  . docusate sodium  100 mg Oral BID  . escitalopram  20 mg Oral QHS  . gabapentin  300 mg Oral QHS  . loratadine  10 mg Oral Daily  . midodrine  15 mg Oral Q8H  . multivitamin  1 tablet Oral QHS  . multivitamin with minerals  1 tablet Oral Daily  . OLANZapine  5 mg Oral QHS  . pantoprazole  40 mg Oral Daily  . Warfarin -  Pharmacist Dosing Inpatient   Does not apply q1800    Continuous Infusions:   PRN Meds: acetaminophen **OR** acetaminophen, alprazolam, bisacodyl, HYDROcodone-acetaminophen, ondansetron **OR** ondansetron (ZOFRAN) IV, polyethylene glycol  Physical Exam  Pulmonary/Chest: Effort normal.  Abdominal: Soft.  Neurological: He is alert.  Nursing note and vitals reviewed.           Vital Signs: BP (!) 94/41 (BP Location: Right Arm)   Pulse 88  Temp 97.9 F (36.6 C) (Oral)   Resp (!) 24   Ht 5\' 6"  (1.676 m)   Wt 99.5 kg (219 lb 5.7 oz)   SpO2 99%   BMI 35.41 kg/m  SpO2: SpO2: 99 % O2 Device: O2 Device: Room Air O2 Flow Rate: O2 Flow Rate (L/min): 2 L/min  Intake/output summary:   Intake/Output Summary (Last 24 hours) at 08/08/2017 1342 Last data filed at 08/08/2017 0915 Gross per 24 hour  Intake 178 ml  Output 0 ml  Net 178 ml   LBM: Last BM Date: 08/06/17 Baseline Weight: Weight: 99.5 kg (219 lb 5.7 oz) Most recent weight: Weight: (unable to weigh pt in chair )       Palliative Assessment/Data: PPS: 50%      Patient Active Problem List   Diagnosis Date Noted  . Advance care planning   . Palliative care by specialist   . End stage renal disease (HCC)   . Nonrheumatic aortic valve insufficiency   . Sepsis (HCC) 08/02/2017  . Solitary pulmonary nodule 05/08/2015  . CHF (congestive heart failure) (HCC) 05/06/2015  . Hyperkalemia 05/06/2015  . Acute encephalopathy 05/06/2015  . OSA on CPAP 05/06/2015  . Morbid obesity (HCC) 05/06/2015  . Fracture of transverse process of thoracic vertebra (HCC)   . 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 Enteritis due to Clostridium difficile   . Hypernatremia   . Convulsion (HCC)   . ESRD (end stage renal disease) (HCC) 08/07/2014  . Altered mental status 08/07/2014  . 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 Epigastric abdominal pain   . Abdominal pain, epigastric 11/17/2013  . Hypotension 11/17/2013  . Abdominal  pain 11/16/2013  . Seizures (HCC) 06/06/2012  . ESRD on dialysis (HCC) 06/06/2012  . Anemia of chronic disease 06/06/2012  . Secondary hyperparathyroidism (of renal origin) 06/06/2012  . HTN (hypertension) 06/06/2012  . Arteriovenous fistula occlusion (HCC) 06/06/2012    Palliative Care Assessment & Plan   Patient Profile:  47 y.o. 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 He was recently discharged on 7/17 but was readmitted and started on empiric antibiotics for sepsis due to hypotension found by EMS in the parking lot. Further workup shows significant aortic and mitral valve regurgitation- worsened since April 2019, with pleural effusion. Not candidate for TEE currently due to hypotension. Conservative management recommended. Noteably he was hospitalized 7/9-7/17 in hopes of finding SNF placement due to severe disability, but was discharged home due to no bed offers.   Assessment/Recommendations/Plan  -Oupatient Palliative referral for continued GOC -Case manager referral for transportation resources to dialysis -PT eval and treat as well as patient would benefit from ongoing PT at home d/t deconditioning during this hospitalization -Will try seroquel for hallucinations- normally would recommend zyprexa- but will avoid due to hypotension   Goals of Care and Additional Recommendations:  Limitations on Scope of Treatment: Full Scope Treatment  Code Status:  DNR  Prognosis:   Unable to determine  Discharge Planning:  Skilled Nursing Facility for rehab with Palliative care service follow-up  Care plan was discussed with patient and his Mom  Thank you for allowing the Palliative Medicine Team to assist in the care of this patient.   Time In: 1300 Time Out: 1400 Total Time 60 mins Prolonged Time Billed  Yes      Greater  than 50%  of this time was spent counseling and coordinating care related to the above assessment and plan.  Mariana Kaufman, AGNP-C Palliative Medicine   Please contact Palliative Medicine Team phone at [phone] for questions and concerns.

## 2017-08-08 NOTE — Progress Notes (Signed)
ANTICOAGULATION CONSULT NOTE - Follow up Consult  Pharmacy Consult for warfarin dosing Indication: stroke  Allergies  Allergen Reactions  . Depakote [Divalproex Sodium] Other (See Comments)    Hallucinations     Patient Measurements: Height: 5\' 6"  (167.6 cm) Weight: (unable to weigh pt in chair ) IBW/kg (Calculated) : 63.8  Vital Signs: Temp: 97.9 F (36.6 C) (07/23 0428) Temp Source: Oral (07/23 0428) BP: 94/41 (07/23 1203) Pulse Rate: 88 (07/23 1203)  Labs: Recent Labs    08/06/17 0710 08/07/17 1027 08/08/17 1301  HGB 13.8  --   --   HCT 44.1  --   --   PLT 238  --   --   LABPROT 34.2* 36.3* 28.4*  INR 3.42 3.69 2.69  CREATININE 6.95*  --   --     Estimated Creatinine Clearance: 14.7 mL/min (A) (by C-G formula based on SCr of 6.95 mg/dL (H)).  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  Warfarin 4mg  daily PTA.              INR    Dose 7/15   2.68    3 mg 7/16   2.30    3 mg 7/17   2.30    4 mg 7/18   2.01    4 mg 7/19     ---     4 mg 7/20   2.62    4 mg 7/21   3.42 -- 7/22   3.69 -- 7/23   2.69  Goal of Therapy:  INR 2-3 Monitor platelets by anticoagulation protocol: Yes   Plan:  INR within goal range. Will continue dosing with reduced dose of warfarin 3 mg for tonight. Will monitor INR daily. CBC every three days.   Marty HeckWang, Cathan Gearin L, [facility] 08/08/2017,2:44 PM

## 2017-08-09 LAB — GLUCOSE, CAPILLARY
GLUCOSE-CAPILLARY: 84 mg/dL (ref 70–99)
GLUCOSE-CAPILLARY: 111 mg/dL — AB (ref 70–99)
Glucose-Capillary: 58 mg/dL — ABNORMAL LOW (ref 70–99)

## 2017-08-09 MED ORDER — CINACALCET HCL 30 MG PO TABS
30.0000 mg | ORAL_TABLET | Freq: Every day | ORAL | Status: DC
  Administered 2017-08-09 – 2017-08-10 (×2): 30 mg via ORAL
  Filled 2017-08-09 (×3): qty 1

## 2017-08-09 MED ORDER — WARFARIN SODIUM 3 MG PO TABS
3.0000 mg | ORAL_TABLET | Freq: Once | ORAL | Status: AC
  Administered 2017-08-09: 3 mg via ORAL
  Filled 2017-08-09: qty 1

## 2017-08-09 NOTE — Progress Notes (Signed)
[facility]  ROUNDING NOTE   Subjective:   Patient appears to be more alert Answering questions appropriately No acute c/o  Objective:  Vital signs in last 24 hours:  Temp:  [97.6 F (36.4 C)-97.7 F (36.5 C)] 97.6 F (36.4 C) (07/24 0530) Pulse Rate:  [77-88] 88 (07/24 0530) Resp:  [18-24] 22 (07/24 0530) BP: (93-107)/(23-42) 93/23 (07/24 0920) SpO2:  [95 %-100 %] 100 % (07/24 0530)  Weight change:  Filed Weights   08/02/17 1945  Weight: 99.5 kg (219 lb 5.7 oz)    Intake/Output: I/O last 3 completed shifts: In: 283 [P.O.:283] Out: 0    Intake/Output this shift:  No intake/output data recorded.  Physical Exam: General: No acute distress, chronically ill appearing   Head: Moist oral mucosal membranes  Eyes: Anicteric  Neck: Supple,   Lungs:  Clear, normal breathing effort  Heart: regular  Abdomen:  Soft, nontender,    Extremities: trace lower extremity edema, left upper extremity edema  Neurologic: Left sided weakness, alert, able to answer questions     Access: L IJ permcath, Left upper arm AVF non functional     Basic Metabolic Panel: Recent Labs  Lab 08/02/17 1955 08/03/17 0422 08/05/17 0925 08/06/17 0710  NA 143 142 141 139  K 4.4 3.6 4.4 4.4  CL 102 104 102 101  CO2 32 31 27 29   GLUCOSE 98 92 83 91  BUN 23* 24* 18 23*  CREATININE 6.11* 6.47* 5.80* 6.95*  CALCIUM 8.6* 8.6* 8.4* 8.4*  MG  --   --   --  3.0*  PHOS  --   --   --  2.9    Liver Function Tests: Recent Labs  Lab 08/02/17 1955 08/06/17 0710  AST 25  --   ALT 33  --   ALKPHOS 85  --   BILITOT 0.9  --   PROT 6.0*  --   ALBUMIN 2.9* 2.8*   No results for input(s): LIPASE, AMYLASE in the last 168 hours. No results for input(s): AMMONIA in the last 168 hours.  CBC: Recent Labs  Lab 08/02/17 1955 08/03/17 0422 08/05/17 0555 08/06/17 0710  WBC 4.1 3.6* 3.9 3.4*  NEUTROABS 2.7  --   --   --   HGB 12.6* 12.4* 13.7 13.8  HCT 40.5 39.0* 43.3 44.1  MCV 88.3  88.8 88.3 87.9  PLT 229 218 241 238    Cardiac Enzymes: Recent Labs  Lab 08/02/17 1955 08/03/17 0422  TROPONINI 0.03* 0.03*    BNP: Invalid input(s): POCBNP  CBG: Recent Labs  Lab 08/07/17 0737 08/08/17 0730 08/08/17 0826 08/09/17 0729 08/09/17 0828  GLUCAP 84 63* 95 58* 84    Microbiology: Results for orders placed or performed during the hospital encounter of 08/02/17  Culture, blood (Routine x 2)     Status: None   Collection Time: 08/02/17  7:55 PM  Result Value Ref Range Status   Specimen Description BLOOD BLOOD RIGHT ARM  Final   Special Requests   Final    BOTTLES DRAWN AEROBIC AND ANAEROBIC Blood Culture adequate volume   Culture   Final    NO GROWTH 5 DAYS Performed at [facility], [address] [place], [place] [address]    Report Status 08/07/2017 FINAL  Final  Culture, blood (Routine x 2)     Status: None   Collection Time: 08/02/17  7:57 PM  Result Value Ref Range Status   Specimen Description BLOOD BLOOD RIGHT FOREARM  Final  Special Requests   Final    BOTTLES DRAWN AEROBIC AND ANAEROBIC Blood Culture adequate volume   Culture   Final    NO GROWTH 5 DAYS Performed at [facility], [address] [place], [place] [address]    Report Status 08/07/2017 FINAL  Final    Coagulation Studies: Recent Labs    08/07/17 1027 08/08/17 1301  LABPROT 36.3* 28.4*  INR 3.69 2.69    Urinalysis: No results for input(s): COLORURINE, LABSPEC, PHURINE, GLUCOSEU, HGBUR, BILIRUBINUR, KETONESUR, PROTEINUR, UROBILINOGEN, NITRITE, LEUKOCYTESUR in the last 72 hours.  Invalid input(s): APPERANCEUR    Imaging: No results found.   Medications:    . amoxicillin-clavulanate  1 tablet Oral Q24H  . aspirin  81 mg Oral Daily  . calcium acetate  2,001 mg Oral TID WC  . Chlorhexidine Gluconate Cloth  6 each Topical Q0600  . docusate sodium  100 mg Oral BID  . loratadine  10 mg Oral Daily  . midodrine  15 mg Oral Q8H  . multivitamin   1 tablet Oral QHS  . multivitamin with minerals  1 tablet Oral Daily  . pantoprazole  40 mg Oral Daily  . Warfarin - Pharmacist Dosing Inpatient   Does not apply q1800   acetaminophen **OR** acetaminophen, bisacodyl, ondansetron **OR** ondansetron (ZOFRAN) IV, polyethylene glycol  Assessment/ Plan:  47 y.o. black male with End-stage renal disease on hemodialysis MWF followed by [employer] [facility], chronic back pain, GERD, hypertension, seizure disorder, obstructive sleep apnea, anemia chronic kidney disease, secondary hyperparathyroidism, anxiety who is now admitted for reestablishing outpt dialysis in [facility].   1.  ESRD on HD : Last dialysis treatment was Friday, 7/19.   - Outpatient planning for TTS [facility] [address] at 11:20am followed by [facility]) Routine hemodialysis on Thursday to align with outpatient schedule  2.  Anemia of chronic kidney disease.  Hemoglobin 13.8.  No indication for ESA  3.  Secondary hyperparathyroidism.  Phosphorus and calcium at goal.  PTH at 132 - Calcium acetate with meals - low dose Cinacalcet 30 mg daily  4.  Hypotension.    - midodrine 15 mg p.o. every 8 hours.  5.  Altered mental status ?  Related to pain medications, psychoactive agents Agree with discontinuing narcotics.  Pain control with nonsteroidals/Tylenol as needed improved   LOS: 7 Perry [name] 7/24/201910:32 AM

## 2017-08-09 NOTE — Clinical Social Work Note (Signed)
No bed offers at this time. Perry [name] had initially offered by then declined patient due to his criminal background. Patient's current plan is to return home at discharge. Currently patient's dialysis and being able to sit up is a barrier to discharge. CSW signing off. Please re-consult CSW if needed. [name] MSW,LCSW [phone]

## 2017-08-09 NOTE — Progress Notes (Addendum)
ANTICOAGULATION CONSULT NOTE - Follow up Consult  Pharmacy Consult for warfarin dosing Indication: stroke  Allergies  Allergen Reactions  . Depakote [Divalproex Sodium] Other (See Comments)    Hallucinations     Patient Measurements: Height: 5\' 6"  (167.6 cm) Weight: (unable to weigh pt in chair ) IBW/kg (Calculated) : 63.8  Vital Signs: Temp: 98.9 F (37.2 C) (07/24 1243) Temp Source: Oral (07/24 0530) BP: 121/35 (07/24 1243) Pulse Rate: 83 (07/24 1243)  Labs: Recent Labs    08/07/17 1027 08/08/17 1301  LABPROT 36.3* 28.4*  INR 3.69 2.69    Estimated Creatinine Clearance: 14.7 mL/min (A) (by C-G formula based on SCr of 6.95 mg/dL (H)).  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  Warfarin 4mg  daily PTA.              INR    Dose 7/15   2.68    3 mg 7/16   2.30    3 mg 7/17   2.30    4 mg 7/18   2.01    4 mg 7/19     ---     4 mg 7/20   2.62    4 mg 7/21   3.42 -- 7/22   3.69 -- 7/23   2.69 3 mg 7/24    --  Goal of Therapy:  INR 2-3 Monitor platelets by anticoagulation protocol: Yes   Plan:  Unable to obtain INR or CBC today - pt refused labs early AM, then mid morning after pt was educated by [employer] phlebotomist tried x1 to draw labs but unsuccessful; pt refused 2nd phlebotomist to even try to draw labs.  Will repeat warfarin 3 mg for tonight based on previous dosing/trend. Will monitor INR daily. CBC in AM. Pt is scheduled to have HD tomorrow and hopefully can have labs drawn at HD. Called HD  and talked with dialysis coordinator about drawing labs at HD tomorrow (she said she would pass along information).   Marty HeckWang, Heddy Vidana L, [facility] 08/09/2017,1:22 PM

## 2017-08-09 NOTE — Progress Notes (Signed)
[facility] [place] [facility]   PATIENT NAME: Perry [name]    MR#:  [id number]  DATE OF BIRTH:  1970-03-25  SUBJECTIVE:  CHIEF COMPLAINT:   Chief Complaint  Patient presents with  . Hypotension  Patient without complaint, no events overnight, attempted to call mother via phone-currently unavailable, case discussed with nephrology, awaiting outpatient hemodialysis chair that we will provide care for patient  REVIEW OF SYSTEMS:  CONSTITUTIONAL: No fever, fatigue or weakness.  EYES: No blurred or double vision.  EARS, NOSE, AND THROAT: No tinnitus or ear pain.  RESPIRATORY: No cough, shortness of breath, wheezing or hemoptysis.  CARDIOVASCULAR: No chest pain, orthopnea, edema.  GASTROINTESTINAL: No nausea, vomiting, diarrhea or abdominal pain.  GENITOURINARY: No dysuria, hematuria.  ENDOCRINE: No polyuria, nocturia,  HEMATOLOGY: No anemia, easy bruising or bleeding SKIN: No rash or lesion. MUSCULOSKELETAL: No joint pain or arthritis.   NEUROLOGIC: No tingling, numbness, weakness.  PSYCHIATRY: No anxiety or depression.   ROS  DRUG ALLERGIES:   Allergies  Allergen Reactions  . Depakote [Divalproex Sodium] Other (See Comments)    Hallucinations     VITALS:  Blood pressure (!) 121/35, pulse 83, temperature 98.9 F (37.2 C), resp. rate 20, height 5\' 6"  (1.676 m), weight 99.5 kg (219 lb 5.7 oz), SpO2 96 %.  PHYSICAL EXAMINATION:  GENERAL:  47 y.o.-year-old patient lying in the bed with no acute distress.  EYES: Pupils equal, round, reactive to light and accommodation. No scleral icterus. Extraocular muscles intact.  HEENT: Head atraumatic, normocephalic. Oropharynx and nasopharynx clear.  NECK:  Supple, no jugular venous distention. No thyroid enlargement, no tenderness.  LUNGS: Normal breath sounds bilaterally, no wheezing, rales,rhonchi or crepitation. No use of accessory muscles of respiration.  CARDIOVASCULAR: S1, S2 normal. No murmurs, rubs, or  gallops.  ABDOMEN: Soft, nontender, nondistended. Bowel sounds present. No organomegaly or mass.  EXTREMITIES: No pedal edema, cyanosis, or clubbing.  NEUROLOGIC: Cranial nerves II through XII are intact. Muscle strength 5/5 in all extremities. Sensation intact. Gait not checked.  PSYCHIATRIC: The patient is alert and oriented x 3.  SKIN: No obvious rash, lesion, or ulcer.   Physical Exam LABORATORY PANEL:   CBC Recent Labs  Lab 08/06/17 0710  WBC 3.4*  HGB 13.8  HCT 44.1  PLT 238   ------------------------------------------------------------------------------------------------------------------  Chemistries  Recent Labs  Lab 08/02/17 1955  08/06/17 0710  NA 143   < > 139  K 4.4   < > 4.4  CL 102   < > 101  CO2 32   < > 29  GLUCOSE 98   < > 91  BUN 23*   < > 23*  CREATININE 6.11*   < > 6.95*  CALCIUM 8.6*   < > 8.4*  MG  --   --  3.0*  AST 25  --   --   ALT 33  --   --   ALKPHOS 85  --   --   BILITOT 0.9  --   --    < > = values in this interval not displayed.   ------------------------------------------------------------------------------------------------------------------  Cardiac Enzymes Recent Labs  Lab 08/02/17 1955 08/03/17 0422  TROPONINI 0.03* 0.03*   ------------------------------------------------------------------------------------------------------------------  RADIOLOGY:  No results found.  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, hypertension, end-stage renal disease on hemodialysis, sleep apnea, history of seizure has been on dialysis since 2011 comes to the emergency room brought  in by mother after patient was [facility] from [place] [facility] a few days ago and has no dialysis chair as outpatient along with patient's family not able to care for him at home  *Acute toxic metabolic encephalopathy Resolved Secondary to medication side effect  All  psychotropic meds were discontinued   *Acute severe aortic mitral regurgitation Worseningnoted Echocardiogram noted for ejection fraction 60-65%, significant worsening of severe aortic as well as mitral regurgitation, shunting cannot be fully assessed-Per cardiology patient is a poor candidate for invasive procedures/TEE/patient is not a surgical candidate, palliative care recommended-there input is greatly appreciated, chaplain services input appreciated, family is interested in hospice/palliative care at home Cardiology input greatly appreciated  *aspiration pneumonia Dysphasia diet Continue oral Augmentin  *ESRD Nephrology input appreciated for hemodialysis TTS Family discussion had-family is considering whether to continue dialysis versus home dialysis status post discharge -attempted to call patient's mother-she is currently unavailable at this time, working with case management currently to obtain dialysis chair that will be able to provide hemodialysis status post discharge, patient has to be able to sit up in the chair for 3 to 4 hours  *Acute on chronicHypotension Stable  Echocardiogram noted  Continuemidodrine which is at max dose Cortisol level normal  *History of CVA with chronic left-sided hemiplegia, functional quadriplegia/bedbound state Continue CoumadinandProvigil  *Gerd  continue PPI  *Left upper extremity swelling due to combination of his stroke and chronic thrombus - onCoumadin  Palliative care/chaplain services consulted given dismal prognosis-family considering home hospice versus palliative care going forward, possible discharge in 1 to 2 days once decision has been made  All the records are reviewed and case discussed with Care Management/Social Workerr. Management plans discussed with the patient, family and they are in agreement.  CODE STATUS: dnr  TOTAL TIME TAKING CARE OF THIS PATIENT: 40 minutes.     POSSIBLE D/C IN 1-2 DAYS,  DEPENDING ON CLINICAL CONDITION.   [name] M.D on 08/09/2017   Between 7am to 6pm - Pager - [phone]  After 6pm go to [email or web address] - password [email or web address]  [facility]  Office  [phone]  CC: Primary care physician; Donita Brooks, MD  Note: This dictation was prepared with Dragon dictation along with smaller phrase technology. Any transcriptional errors that result from this process are unintentional.

## 2017-08-09 NOTE — Care Management (Signed)
Discharge disposition discussed with patient and mother.  Mother confirms that patient will be returning home at discharge.  Confirmed that bed, WC, and lift were delivered to home.  Plan to discharge home with [facility], and outpatient palliative.  Patient, mother, and MD are aware that patient must be able to sit for HD prior to discharge.    Per [facility] HD liaison outpatient center that originally had accepted patient is no longer able to accept patient.  Will wait for confirmation of outpatient HD.   Mother states that she will be ordering metal ramps from [employer], but "hasnt gotten them yet because he isn't home"  RNCM encouraged mother to go ahead and make arrangements prior to discharge so there would nto be any additional barriers.  RNCM again discussed use of ACTA, and non emergent EMS transport to HD after discharge.

## 2017-08-09 NOTE — Progress Notes (Signed)
PT Cancellation Note  Patient Details Name: Perry [name] MRN: [id number] DOB: 1970-03-16   Cancelled Treatment:    Reason Eval/Treat Not Completed: Medical issues which prohibited therapy; Pt's BP taken at onset of physical therapy evaluation at 93/23 mmHg.  Session held at this time secondary to low BP with nursing notified.     Thomes Dinningavid Scott Jaquavis Felmlee 08/09/2017, 9:27 AM

## 2017-08-09 NOTE — Care Management Important Message (Signed)
Important Message  Patient Details  Name: Dana Allanyrone E Silos MRN: [id number] Date of Birth: 06-Jun-1970   Medicare Important Message Given:  No(message left for patient's mother to review initial IM)    Chapman FitchBOWEN, Jontez Redfield T, RN 08/09/2017, 4:05 PM

## 2017-08-09 NOTE — Progress Notes (Signed)
Patient tolerating medications today, alert with intermittent periods of confusion - patient making random statements. Patient conversant, pleasant and interactive in care. Patient to go to HD tomorrow, per CM, will need to go to dialysis in the chair; Will pass on in report to nightshift RN. Unable to obtain labs today- patient refused multiple attempts by lab- MD aware.

## 2017-08-09 NOTE — Progress Notes (Signed)
Hypoglycemic Event  CBG: 54  Treatment: 6 oz juice  Symptoms: None, not change in mental status.   Follow-up CBG: VHQI:[id number]:[id number] CBG Result:84  Possible Reasons for Event: unknown  Comments/MD notified:Dr Salary paged and made aware    Audie Cleariffany B Wilson

## 2017-08-10 LAB — RENAL FUNCTION PANEL
Albumin: 2.8 g/dL — ABNORMAL LOW (ref 3.5–5.0)
Anion gap: 12 (ref 5–15)
BUN: 36 mg/dL — ABNORMAL HIGH (ref 6–20)
CALCIUM: 8.9 mg/dL (ref 8.9–10.3)
CO2: 27 mmol/L (ref 22–32)
CREATININE: 9.71 mg/dL — AB (ref 0.61–1.24)
Chloride: 106 mmol/L (ref 98–111)
GFR calc non Af Amer: 6 mL/min — ABNORMAL LOW (ref >60)
GFR, EST AFRICAN AMERICAN: 7 mL/min — AB (ref >60)
Glucose, Bld: 110 mg/dL — ABNORMAL HIGH (ref 70–99)
Phosphorus: 1.9 mg/dL — ABNORMAL LOW (ref 2.5–4.6)
Potassium: 4.2 mmol/L (ref 3.5–5.1)
SODIUM: 145 mmol/L (ref 135–145)

## 2017-08-10 LAB — CBC
HEMATOCRIT: 40.5 % (ref 40.0–52.0)
HEMOGLOBIN: 12.7 g/dL — AB (ref 13.0–18.0)
MCH: 27.5 pg (ref 26.0–34.0)
MCHC: 31.4 g/dL — ABNORMAL LOW (ref 32.0–36.0)
MCV: 87.5 fL (ref 80.0–100.0)
Platelets: 267 10*3/uL (ref 150–440)
RBC: 4.62 MIL/uL (ref 4.40–5.90)
RDW: 20 % — AB (ref 11.5–14.5)
WBC: 5 10*3/uL (ref 3.8–10.6)

## 2017-08-10 LAB — PROTIME-INR
INR: 2.12
Prothrombin Time: 23.6 seconds — ABNORMAL HIGH (ref 11.4–15.2)

## 2017-08-10 LAB — GLUCOSE, CAPILLARY: Glucose-Capillary: 73 mg/dL (ref 70–99)

## 2017-08-10 MED ORDER — WARFARIN SODIUM 3 MG PO TABS
3.0000 mg | ORAL_TABLET | Freq: Once | ORAL | Status: AC
  Administered 2017-08-10: 3 mg via ORAL
  Filled 2017-08-10: qty 1

## 2017-08-10 NOTE — Progress Notes (Signed)
HD TX started   08/10/17 1030  Vital Signs  Temp 98.7 F (37.1 C)  Temp Source Oral  Pulse Rate 90  Pulse Rate Source Monitor  Resp (!) 21  BP (!) 104/41  BP Location Right Arm  BP Method Automatic  Patient Position (if appropriate) Sitting  Oxygen Therapy  SpO2 98 %  O2 Device Room Air  Pain Assessment  Pain Scale 0-10  Pain Score 0  Time-Out for Hemodialysis  What Procedure? HD   Pt Identifiers(min of two) First/Last Name;MRN/Account#  Correct Site? Yes  Correct Side? Yes  Correct Procedure? Yes  Consents Verified? Yes  Rad Studies Available? N/A  Safety Precautions Reviewed? Yes  Machine [employer]  Machine Number [id number]  Station Number 4  UF/Alarm Test Passed  Conductivity: Meter 13.8  Conductivity: Machine  14  pH 7.4  Reverse Osmosis Main  Normal Saline Lot Number [id number]  Dialyzer Lot Number [id number]  Disposable Set Lot Number [id number]  Machine Temperature 98.6 F (37 C)  ImmunologistAir Detector Armed and Audible Yes  Blood Lines Intact and Secured Yes  Pre Treatment Patient Checks  Vascular access used during treatment Catheter  Patient is receiving dialysis in a chair Yes  Hepatitis B Surface Antigen Results Negative  Date Hepatitis B Surface Antigen Drawn 07/27/17  Isolation Initiated Yes  Hepatitis B Surface Antibody  (<10)  Date Hepatitis B Surface Antibody Drawn 07/27/17  Hemodialysis Consent Verified Yes  Hemodialysis Standing Orders Initiated Yes  ECG (Telemetry) Monitor On Yes  Prime Ordered Normal Saline  Length of  DialysisTreatment -hour(s) 3.5 Hour(s)  Dialysis Treatment Comments Na 140  Dialyzer Elisio 17H NR  Dialysate 3K, 2.5 Ca  Dialysis Anticoagulant None  Dialysate Flow Ordered 800  Blood Flow Rate Ordered 400 mL/min  Ultrafiltration Goal 0.5 Liters  Pre Treatment Labs Renal panel;PT;CBC  Dialysis Blood Pressure Support Ordered Normal Saline  During Hemodialysis Assessment  Blood Flow Rate (mL/min) 400 mL/min  Arterial Pressure (mmHg) -230  mmHg  Venous Pressure (mmHg) 230 mmHg  Transmembrane Pressure (mmHg) 70 mmHg  Ultrafiltration Rate (mL/min) 500 mL/min  Dialysate Flow Rate (mL/min) 800 ml/min  Conductivity: Machine  13.8  HD Safety Checks Performed Yes  Dialysis Fluid Bolus Normal Saline  Bolus Amount (mL) 250 mL  Intra-Hemodialysis Comments Tx initiated  Education / Care Plan  Dialysis Education Provided Yes  Documented Education in Care Plan Yes  Hemodialysis Catheter Left Subclavian Double-lumen  No Placement Date or Time found.   Orientation: Left  Access Location: Subclavian  Hemodialysis Catheter Type: Double-lumen  Site Condition No complications  Blue Lumen Status Flushed  Red Lumen Status Flushed  Purple Lumen Status N/A  Dressing Type Biopatch;Occlusive  Dressing Status Clean;Dry;Intact

## 2017-08-10 NOTE — Progress Notes (Signed)
ANTICOAGULATION CONSULT NOTE - Follow up Consult  Pharmacy Consult for warfarin dosing Indication: stroke  Allergies  Allergen Reactions  . Depakote [Divalproex Sodium] Other (See Comments)    Hallucinations     Patient Measurements: Height: 5\' 6"  (167.6 cm) Weight: 205 lb 4.8 oz (93.1 kg) IBW/kg (Calculated) : 63.8  Vital Signs: Temp: 98.7 F (37.1 C) (07/25 1030) Temp Source: Oral (07/25 1030) BP: 109/36 (07/25 1315) Pulse Rate: 80 (07/25 1315)  Labs: Recent Labs    08/08/17 1301 08/10/17 1131  HGB  --  12.7*  HCT  --  40.5  PLT  --  267  LABPROT 28.4* 23.6*  INR 2.69 2.12  CREATININE  --  9.71*    Estimated Creatinine Clearance: 10.2 mL/min (A) (by C-G formula based on SCr of 9.71 mg/dL (H)).  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  Warfarin 4mg  daily PTA.              INR    Dose 7/15   2.68    3 mg 7/16   2.30    3 mg 7/17   2.30    4 mg 7/18   2.01    4 mg 7/19     ---     4 mg 7/20   2.62    4 mg 7/21   3.42 -- 7/22   3.69 -- 7/23   2.69 3 mg 7/24    --      3 mg 7/25   2.12 3 mg  Goal of Therapy:  INR 2-3 Monitor platelets by anticoagulation protocol: Yes  DDI: Augmentin (started 7/22)   Plan:  Will repeat warfarin 3 mg for tonight based on previous dosing/trend.   Burnis Medinodney Izyk Marty, PhamD

## 2017-08-10 NOTE — Progress Notes (Signed)
BFR reduced to 300 to support B/P. MD aware

## 2017-08-10 NOTE — Progress Notes (Signed)
[facility]  ROUNDING NOTE   Subjective:   Patient appears to be more alert Answering questions appropriately No acute c/o Room air at present but feels like he needs oxygen  Objective:  Vital signs in last 24 hours:  Temp:  [98.7 F (37.1 C)-98.9 F (37.2 C)] 98.7 F (37.1 C) (07/25 0538) Pulse Rate:  [83-92] 88 (07/25 0538) Resp:  [20] 20 (07/25 0538) BP: (84-121)/(35-68) 93/47 (07/25 0538) SpO2:  [96 %-98 %] 98 % (07/25 0538) Weight:  [93.1 kg (205 lb 4.8 oz)] 93.1 kg (205 lb 4.8 oz) (07/25 0500)  Weight change:  Filed Weights   08/02/17 1945 08/10/17 0500  Weight: 99.5 kg (219 lb 5.7 oz) 93.1 kg (205 lb 4.8 oz)    Intake/Output: I/O last 3 completed shifts: In: 105 [P.O.:105] Out: 0    Intake/Output this shift:  No intake/output data recorded.  Physical Exam: General: No acute distress, chronically ill appearing   Head: Moist oral mucosal membranes  Eyes: Anicteric  Neck: Supple,   Lungs:  Clear, normal breathing effort  Heart: regular  Abdomen:  Soft, nontender,    Extremities: trace lower extremity edema, left upper extremity edema  Neurologic: Left sided weakness, alert, able to answer questions     Access: L IJ permcath, Left upper arm AVF non functional     Basic Metabolic Panel: Recent Labs  Lab 08/05/17 0925 08/06/17 0710  NA 141 139  K 4.4 4.4  CL 102 101  CO2 27 29  GLUCOSE 83 91  BUN 18 23*  CREATININE 5.80* 6.95*  CALCIUM 8.4* 8.4*  MG  --  3.0*  PHOS  --  2.9    Liver Function Tests: Recent Labs  Lab 08/06/17 0710  ALBUMIN 2.8*   No results for input(s): LIPASE, AMYLASE in the last 168 hours. No results for input(s): AMMONIA in the last 168 hours.  CBC: Recent Labs  Lab 08/05/17 0555 08/06/17 0710  WBC 3.9 3.4*  HGB 13.7 13.8  HCT 43.3 44.1  MCV 88.3 87.9  PLT 241 238    Cardiac Enzymes: No results for input(s): CKTOTAL, CKMB, CKMBINDEX, TROPONINI in the last 168 hours.  BNP: Invalid input(s):  POCBNP  CBG: Recent Labs  Lab 08/08/17 0826 08/09/17 0729 08/09/17 0828 08/09/17 2120 08/10/17 0748  GLUCAP 95 58* 84 111* 73    Microbiology: Results for orders placed or performed during the hospital encounter of 08/02/17  Culture, blood (Routine x 2)     Status: None   Collection Time: 08/02/17  7:55 PM  Result Value Ref Range Status   Specimen Description BLOOD BLOOD RIGHT ARM  Final   Special Requests   Final    BOTTLES DRAWN AEROBIC AND ANAEROBIC Blood Culture adequate volume   Culture   Final    NO GROWTH 5 DAYS Performed at [facility], [address], [place], [place] [address]    Report Status 08/07/2017 FINAL  Final  Culture, blood (Routine x 2)     Status: None   Collection Time: 08/02/17  7:57 PM  Result Value Ref Range Status   Specimen Description BLOOD BLOOD RIGHT FOREARM  Final   Special Requests   Final    BOTTLES DRAWN AEROBIC AND ANAEROBIC Blood Culture adequate volume   Culture   Final    NO GROWTH 5 DAYS Performed at [facility], [address], [place], [place] [address]    Report Status 08/07/2017 FINAL  Final    Coagulation Studies:  Recent Labs    08/07/17 1027 08/08/17 1301  LABPROT 36.3* 28.4*  INR 3.69 2.69    Urinalysis: No results for input(s): COLORURINE, LABSPEC, PHURINE, GLUCOSEU, HGBUR, BILIRUBINUR, KETONESUR, PROTEINUR, UROBILINOGEN, NITRITE, LEUKOCYTESUR in the last 72 hours.  Invalid input(s): APPERANCEUR    Imaging: No results found.   Medications:    . amoxicillin-clavulanate  1 tablet Oral Q24H  . aspirin  81 mg Oral Daily  . calcium acetate  2,001 mg Oral TID WC  . Chlorhexidine Gluconate Cloth  6 each Topical Q0600  . cinacalcet  30 mg Oral Q supper  . docusate sodium  100 mg Oral BID  . loratadine  10 mg Oral Daily  . midodrine  15 mg Oral Q8H  . multivitamin  1 tablet Oral QHS  . multivitamin with minerals  1 tablet Oral Daily  . pantoprazole  40 mg Oral Daily  . Warfarin -  Pharmacist Dosing Inpatient   Does not apply q1800   acetaminophen **OR** acetaminophen, bisacodyl, ondansetron **OR** ondansetron (ZOFRAN) IV, polyethylene glycol  Assessment/ Plan:  47 y.o. black male with End-stage renal disease on hemodialysis MWF [facility], chronic back pain, GERD, hypertension, seizure disorder, obstructive sleep apnea, anemia chronic kidney disease, secondary hyperparathyroidism, anxiety who is now admitted [facility].   1.  ESRD on HD : Last dialysis treatment was Friday, 7/19.   - [facility] [address] [facility] Routine hemodialysis on Thursday to align with outpatient schedule Dialysis in chair today  2.  Anemia of chronic kidney disease.  Hemoglobin 13.8.  No indication for ESA  3.  Secondary hyperparathyroidism - Calcium acetate with meals - low dose Cinacalcet 30 mg daily  4.  Hypotension.    - midodrine 15 mg p.o. every 8 hours.  5.  Altered mental status ?  Related to pain medications, psychoactive agents Agree with discontinuing narcotics.  Pain control with nonsteroidals/Tylenol as needed Mental status has improved. No further hallucinations   LOS: 8 [name] 7/25/20199:23 AM

## 2017-08-10 NOTE — Progress Notes (Signed)
Post HD TX    08/10/17 1415  Vital Signs  Pulse Rate 74  Resp 12  BP (!) 105/43  Oxygen Therapy  SpO2 100 %  Pain Assessment  Pain Scale 0-10  Pain Score 0  Post-Hemodialysis Assessment  Rinseback Volume (mL) 250 mL  KECN 64.3 V  Dialyzer Clearance Heavily streaked  Hemodialysis Intake (mL) 250 mL  UF Total -Machine (mL) 1532 mL  Net UF (mL) 1282 mL  Tolerated HD Treatment Yes  Hemodialysis Catheter Left Subclavian Double-lumen  No Placement Date or Time found.   Orientation: Left  Access Location: Subclavian  Hemodialysis Catheter Type: Double-lumen  Site Condition No complications  Blue Lumen Status Flushed;Capped (Central line);Heparin locked;Blood return noted  Red Lumen Status Flushed;Capped (Central line);Heparin locked;Blood return noted  Catheter fill solution Heparin 1000 units/ml  Catheter fill volume (Arterial) 2 cc  Catheter fill volume (Venous) 2  Dressing Type Biopatch;Occlusive  Dressing Status Clean;Dry;Intact  Post treatment catheter status Capped and Clamped

## 2017-08-10 NOTE — Progress Notes (Signed)
[facility] for [facility] Pall[facility] to [facility]. Plan is for discharge home tomorrow following dialysis. Patient information faxed to referral. Dayna BarkerKaren robertson RN, BSN, [facility], hospital Liaison [phone]

## 2017-08-10 NOTE — Progress Notes (Signed)
[facility] - [place] at [facility]   PATIENT NAME: Perry [name]    MR#:  [id number]  DATE OF BIRTH:  Jun 16, 1970  SUBJECTIVE:  CHIEF COMPLAINT:   Chief Complaint  Patient presents with  . Hypotension  Patient without complaint, case discussed with nephrology as well as outpatient hemodialysis facility coordinator-plan for discharge after hemodialysis on tomorrow  REVIEW OF SYSTEMS:  CONSTITUTIONAL: No fever, fatigue or weakness.  EYES: No blurred or double vision.  EARS, NOSE, AND THROAT: No tinnitus or ear pain.  RESPIRATORY: No cough, shortness of breath, wheezing or hemoptysis.  CARDIOVASCULAR: No chest pain, orthopnea, edema.  GASTROINTESTINAL: No nausea, vomiting, diarrhea or abdominal pain.  GENITOURINARY: No dysuria, hematuria.  ENDOCRINE: No polyuria, nocturia,  HEMATOLOGY: No anemia, easy bruising or bleeding SKIN: No rash or lesion. MUSCULOSKELETAL: No joint pain or arthritis.   NEUROLOGIC: No tingling, numbness, weakness.  PSYCHIATRY: No anxiety or depression.   ROS  DRUG ALLERGIES:   Allergies  Allergen Reactions  . Depakote [Divalproex Sodium] Other (See Comments)    Hallucinations     VITALS:  Blood pressure (!) 98/31, pulse 89, temperature 98.7 F (37.1 C), temperature source Oral, resp. rate (!) 29, height 5\' 6"  (1.676 m), weight 93.1 kg (205 lb 4.8 oz), SpO2 95 %.  PHYSICAL EXAMINATION:  GENERAL:  47 y.o.-year-old patient lying in the bed with no acute distress.  EYES: Pupils equal, round, reactive to light and accommodation. No scleral icterus. Extraocular muscles intact.  HEENT: Head atraumatic, normocephalic. Oropharynx and nasopharynx clear.  NECK:  Supple, no jugular venous distention. No thyroid enlargement, no tenderness.  LUNGS: Normal breath sounds bilaterally, no wheezing, rales,rhonchi or crepitation. No use of accessory muscles of respiration.  CARDIOVASCULAR: S1, S2 normal. No murmurs, rubs, or gallops.  ABDOMEN: Soft,  nontender, nondistended. Bowel sounds present. No organomegaly or mass.  EXTREMITIES: No pedal edema, cyanosis, or clubbing.  NEUROLOGIC: Cranial nerves II through XII are intact. Muscle strength 5/5 in all extremities. Sensation intact. Gait not checked.  PSYCHIATRIC: The patient is alert and oriented x 3.  SKIN: No obvious rash, lesion, or ulcer.   Physical Exam LABORATORY PANEL:   CBC Recent Labs  Lab 08/10/17 1131  WBC 5.0  HGB 12.7*  HCT 40.5  PLT 267   ------------------------------------------------------------------------------------------------------------------  Chemistries  Recent Labs  Lab 08/06/17 0710 08/10/17 1131  NA 139 145  K 4.4 4.2  CL 101 106  CO2 29 27  GLUCOSE 91 110*  BUN 23* 36*  CREATININE 6.95* 9.71*  CALCIUM 8.4* 8.9  MG 3.0*  --    ------------------------------------------------------------------------------------------------------------------  Cardiac Enzymes No results for input(s): TROPONINI in the last 168 hours. ------------------------------------------------------------------------------------------------------------------  RADIOLOGY:  No results found.  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, hypertension, end-stage renal disease on hemodialysis, sleep apnea, history of seizure has been on dialysis since 2011 comes to the emergency room brought in by mother after [facility] and has no dialysis chair as outpatient along with patient's family not able to care for him at home  *Acute toxic metabolic encephalopathy Resolved Secondary to medication side effect  All psychotropic meds were discontinued   *Acute severe aortic mitral regurgitation Worseningnoted Echocardiogram noted for ejection fraction 60-65%, significant worsening of severe aortic as well as mitral regurgitation,  shunting cannot be fully assessed-Per cardiology patient is a poor candidate for invasive procedures/TEE/patient is not  a surgical candidate, palliative care recommended-there input is greatly appreciated,chaplain services input appreciated, family is interested in hospice/palliative care at home Cardiology input greatly appreciated  *aspiration pneumonia Dysphasia diet Continue oral Augmentin  *ESRD Nephrology input appreciated for hemodialysis TTS Difficult discharge due to obtaining dialysis chair status post discharge, tentative plans for discharge tomorrow after hemodialysis and discussion with hemodialysis care coordinator For dialysis later today  *Acute on chronicHypotension Continued tenuous situation Echocardiogram noted  Continuemidodrine which is at max dose Cortisol level normal  *History of CVA with chronic left-sided hemiplegia, functional quadriplegia/bedbound state Continue CoumadinandProvigil  *Gerd  continue PPI  *Left upper extremity swelling due to combination of his stroke and chronic thrombus - onCoumadin  Tentative discharge to home with palliative care on tomorrow after hemodialysis, barring any complications  All the records are reviewed and case discussed with Care Management/Social Workerr. Management plans discussed with the patient, family and they are in agreement.  CODE STATUS: dnr  TOTAL TIME TAKING CARE OF THIS PATIENT: 45 minutes.     POSSIBLE D/C IN 1 DAYS, DEPENDING ON CLINICAL CONDITION.   Perry AsaMontell D [name] M.D on 08/10/2017   Between 7am to 6pm - Pager - [phone]  After 6pm go to [email or web address] - password [email or web address]  [facility] [place] [facility]  Office  [phone]  CC: Primary care physician; [name], Perry T, MD  Note: This dictation was prepared with Dragon dictation along with smaller phrase technology. Any transcriptional errors that result from this process are unintentional.

## 2017-08-10 NOTE — Progress Notes (Signed)
BP improved with decrease in BFR

## 2017-08-10 NOTE — Progress Notes (Signed)
HD Tx completed    08/10/17 1408  Vital Signs  Pulse Rate 74  Resp 12  BP (!) 109/34  Oxygen Therapy  SpO2 100 %  During Hemodialysis Assessment  HD Safety Checks Performed Yes  [facility]N [facility]  Dialysis Fluid Bolus Normal Saline  Bolus Amount (mL) 250 mL

## 2017-08-10 NOTE — Progress Notes (Signed)
Post HD Assessment , tolerated well, UF goal met, pt stable    08/10/17 1415  Neurological  Level of Consciousness Alert  Orientation Level Oriented X4  Respiratory  Respiratory Pattern Regular;Unlabored;Symmetrical  Chest Assessment Chest expansion symmetrical  Bilateral Breath Sounds Diminished  R Upper  Breath Sounds Diminished;Clear  L Upper Breath Sounds Diminished;Clear  Cough None  Cardiac  Pulse Regular  Jugular Venous Distention (JVD) No  ECG Monitor Yes  Antiarrhythmic device No  Vascular  R Radial Pulse +3  L Radial Pulse +1  R Dorsalis Pedis Pulse +2  L Dorsalis Pedis Pulse +1  Edema Left upper extremity;Left lower extremity  RUE Edema +1  LUE Edema +2  RLE Edema +1  LLE Edema +2  Integumentary  Integumentary (WDL) X  Skin Color Appropriate for ethnicity  Skin Condition Flaky;Dry  Skin Integrity Abrasion  Cracking Location Foot  Cracking Location Orientation Right;Left  Musculoskeletal  Musculoskeletal (WDL) X  Generalized Weakness Yes  Gastrointestinal  Bowel Sounds Assessment Active  GU Assessment  Genitourinary (WDL) X  Genitourinary Symptoms Anuria  Psychosocial  Psychosocial (WDL) WDL  Patient Behaviors Calm;Cooperative  Needs Expressed Denies  Emotional support given Given to patient

## 2017-08-11 ENCOUNTER — Telehealth: Payer: Self-pay | Admitting: Cardiovascular Disease

## 2017-08-11 LAB — GLUCOSE, CAPILLARY
Glucose-Capillary: 85 mg/dL (ref 70–99)
Glucose-Capillary: 91 mg/dL (ref 70–99)

## 2017-08-11 LAB — PROTIME-INR
INR: 2.19
PROTHROMBIN TIME: 24.2 s — AB (ref 11.4–15.2)

## 2017-08-11 MED ORDER — RENA-VITE PO TABS: 1.0000 | ORAL_TABLET | Freq: Every day | ORAL | 0 refills | Status: AC

## 2017-08-11 MED ORDER — ASPIRIN 81 MG PO CHEW: 81.0000 mg | CHEWABLE_TABLET | Freq: Every day | ORAL | 0 refills | Status: AC

## 2017-08-11 MED ORDER — AMOXICILLIN-POT CLAVULANATE 500-125 MG PO TABS: 1.0000 | ORAL_TABLET | ORAL | 0 refills | Status: DC

## 2017-08-11 MED ORDER — WARFARIN SODIUM 3 MG PO TABS
3.0000 mg | ORAL_TABLET | Freq: Once | ORAL | Status: DC
  Filled 2017-08-11: qty 1
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
## 2017-08-11 NOTE — Telephone Encounter (Signed)
Currently admitted.

## 2017-08-11 NOTE — Progress Notes (Signed)
Post dialysis assessment 

## 2017-08-11 NOTE — Care Management Note (Signed)
Case Management Note  Patient Details  Name: Perry [name] MRN: [id number] Date of Birth: 26-May-1970   Patient to discharge today.  Cheryl with [facility] notified.  Clydie BraunKaren with [facility] notified. Dimas ChyleAmanda Morris dialysis liaison notified of discharge.  EMS form has been completed and place on chart.  DNR has been completed and placed on chart.  Patient to start outpatient HD on Monday   Subjective/Objective:                    Action/Plan:   Expected Discharge Date:  08/11/17               Expected Discharge Plan:  Home w Home Health Services  In-House Referral:     Discharge planning Services  CM Consult  Post Acute Care Choice:  Home Health Choice offered to:  Adult Children  DME Arranged:    DME Agency:     HH Arranged:  RN, Nurse's Aide, Social Work [employer] Agency:  [facility]  Status of Service:  Completed, signed off  If discussed at [employer] Meetings, dates discussed:    Additional CommentsChapman Fitch:  [name], RN 08/11/2017, 4:08 PM

## 2017-08-11 NOTE — Progress Notes (Signed)
Hd started  

## 2017-08-11 NOTE — Telephone Encounter (Signed)
TCM....  Patient is being discharged   They saw Dr. Mariah MillingGollan  They are scheduled to see Dr. Mariah MillingGollan on 8/13  They were seen for aortic valve regurgitation  They need to be seen within 1 week  Pt not added to wait list   Please call

## 2017-08-11 NOTE — Progress Notes (Signed)
[facility]  ROUNDING NOTE   Subjective:   ate breakfast sitting up in chair Room air Thinks he was in hospital but now [place]    HEMODIALYSIS FLOWSHEET:  Blood Flow Rate (mL/min): 350 mL/min Arterial Pressure (mmHg): -220 mmHg Venous Pressure (mmHg): 200 mmHg Transmembrane Pressure (mmHg): 50 mmHg Ultrafiltration Rate (mL/min): 330 mL/min Dialysate Flow Rate (mL/min): 800 ml/min Conductivity: Machine : 13.9 Conductivity: Machine : 13.9 Dialysis Fluid Bolus: Normal Saline Bolus Amount (mL): 250 mL     Objective:  Vital signs in last 24 hours:  Temp:  [97.7 F (36.5 C)-98.7 F (37.1 C)] 97.8 F (36.6 C) (07/26 0436) Pulse Rate:  [74-96] 84 (07/26 0837) Resp:  [12-33] 18 (07/26 0436) BP: (84-115)/(23-52) 91/23 (07/26 0436) SpO2:  [92 %-100 %] 96 % (07/26 0837) Weight:  [90.2 kg (198 lb 13.7 oz)] 90.2 kg (198 lb 13.7 oz) (07/26 0436)  Weight change: -2.923 kg (-6 lb 7.1 oz) Filed Weights   08/10/17 0500 08/11/17 0436  Weight: 93.1 kg (205 lb 4.8 oz) 90.2 kg (198 lb 13.7 oz)    Intake/Output: I/O last 3 completed shifts: In: 0  Out: 1282 [Other:1282]   Intake/Output this shift:  No intake/output data recorded.  Physical Exam: General: No acute distress, chronically ill appearing   Head: Moist oral mucosal membranes  Eyes: Anicteric  Neck: Supple,   Lungs:  Clear, normal breathing effort  Heart: regular  Abdomen:  Soft, nontender,    Extremities: trace lower extremity edema, left upper extremity edema  Neurologic: Left sided weakness, alert, able to answer questions, oriented to time and person but not place     Access: L IJ permcath, Left upper arm AVF non functional     Basic Metabolic Panel: Recent Labs  Lab 08/05/17 0925 08/06/17 0710 08/10/17 1131  NA 141 139 145  K 4.4 4.4 4.2  CL 102 101 106  CO2 27 29 27   GLUCOSE 83 91 110*  BUN 18 23* 36*  CREATININE 5.80* 6.95* 9.71*  CALCIUM 8.4*  8.4* 8.9  MG  --  3.0*  --   PHOS  --  2.9 1.9*    Liver Function Tests: Recent Labs  Lab 08/06/17 0710 08/10/17 1131  ALBUMIN 2.8* 2.8*   No results for input(s): LIPASE, AMYLASE in the last 168 hours. No results for input(s): AMMONIA in the last 168 hours.  CBC: Recent Labs  Lab 08/05/17 0555 08/06/17 0710 08/10/17 1131  WBC 3.9 3.4* 5.0  HGB 13.7 13.8 12.7*  HCT 43.3 44.1 40.5  MCV 88.3 87.9 87.5  PLT 241 238 267    Cardiac Enzymes: No results for input(s): CKTOTAL, CKMB, CKMBINDEX, TROPONINI in the last 168 hours.  BNP: Invalid input(s): POCBNP  CBG: Recent Labs  Lab 08/09/17 0729 08/09/17 0828 08/09/17 2120 08/10/17 0748 08/11/17 0738  GLUCAP 58* 84 111* 73 85    Microbiology: Results for orders placed or performed during the hospital encounter of 08/02/17  Culture, blood (Routine x 2)     Status: None   Collection Time: 08/02/17  7:55 PM  Result Value Ref Range Status   Specimen Description BLOOD BLOOD RIGHT ARM  Final   Special Requests   Final    BOTTLES DRAWN AEROBIC AND ANAEROBIC Blood Culture adequate volume   Culture   Final    NO GROWTH 5 DAYS Performed at [facility], [address] [place], [place] [address]    Report Status 08/07/2017  FINAL  Final  Culture, blood (Routine x 2)     Status: None   Collection Time: 08/02/17  7:57 PM  Result Value Ref Range Status   Specimen Description BLOOD BLOOD RIGHT FOREARM  Final   Special Requests   Final    BOTTLES DRAWN AEROBIC AND ANAEROBIC Blood Culture adequate volume   Culture   Final    NO GROWTH 5 DAYS Performed at [facility], [address] [place], [place] [address]    Report Status 08/07/2017 FINAL  Final    Coagulation Studies: Recent Labs    08/08/17 1301 08/10/17 1131  LABPROT 28.4* 23.6*  INR 2.69 2.12    Urinalysis: No results for input(s): COLORURINE, LABSPEC, PHURINE, GLUCOSEU, HGBUR, BILIRUBINUR, KETONESUR, PROTEINUR, UROBILINOGEN, NITRITE,  LEUKOCYTESUR in the last 72 hours.  Invalid input(s): APPERANCEUR    Imaging: No results found.   Medications:    . amoxicillin-clavulanate  1 tablet Oral Q24H  . aspirin  81 mg Oral Daily  . calcium acetate  2,001 mg Oral TID WC  . Chlorhexidine Gluconate Cloth  6 each Topical Q0600  . cinacalcet  30 mg Oral Q supper  . docusate sodium  100 mg Oral BID  . loratadine  10 mg Oral Daily  . midodrine  15 mg Oral Q8H  . multivitamin  1 tablet Oral QHS  . multivitamin with minerals  1 tablet Oral Daily  . pantoprazole  40 mg Oral Daily  . Warfarin - Pharmacist Dosing Inpatient   Does not apply q1800   acetaminophen **OR** acetaminophen, bisacodyl, ondansetron **OR** ondansetron (ZOFRAN) IV, polyethylene glycol  Assessment/ Plan:  47 y.o. black male with End-stage renal disease on hemodialysis MWF [facility], chronic back pain, GERD, hypertension, seizure disorder, obstructive sleep apnea, anemia chronic kidney disease, secondary hyperparathyroidism, anxiety who is now admitted for reestablishing outpt dialysis [place] [facility].   1.  ESRD on HD   - Outpatient planning for MWF [address] as [facility] declined to take him  seen during dialysis in chair today  2.  Anemia of chronic kidney disease.  Hemoglobin 12.7.  No indication for ESA  3.  Secondary hyperparathyroidism - Calcium acetate with meals - low dose Cinacalcet 30 mg daily  4.  Hypotension.    - midodrine 15 mg p.o. every 8 hours.  5.  Altered mental status ?  Related to pain medications, psychoactive agents Agree with discontinuing narcotics.  Pain control with nonsteroidals/Tylenol as needed Mental status has improved.  Now oriented to time and person   LOS: 9 Perry [name] 7/26/20199:47 AM

## 2017-08-11 NOTE — Progress Notes (Signed)
ANTICOAGULATION CONSULT NOTE - Follow up Consult  Pharmacy Consult for warfarin dosing Indication: stroke  Allergies  Allergen Reactions  . Depakote [Divalproex Sodium] Other (See Comments)    Hallucinations     Patient Measurements: Height: 5\' 6"  (167.6 cm) Weight: 198 lb 13.7 oz (90.2 kg) IBW/kg (Calculated) : 63.8  Vital Signs: Temp: 97.7 F (36.5 C) (07/26 1055) Temp Source: Oral (07/26 1055) BP: 93/31 (07/26 1200) Pulse Rate: 87 (07/26 1200)  Labs: Recent Labs    08/08/17 1301 08/10/17 1131 08/11/17 1103  HGB  --  12.7*  --   HCT  --  40.5  --   PLT  --  267  --   LABPROT 28.4* 23.6* 24.2*  INR 2.69 2.12 2.19  CREATININE  --  9.71*  --     Estimated Creatinine Clearance: 10 mL/min (A) (by C-G formula based on SCr of 9.71 mg/dL (H)).  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  Warfarin 4mg  daily PTA.              INR    Dose 7/15   2.68    3 mg 7/16   2.30    3 mg 7/17   2.30    4 mg 7/18   2.01    4 mg 7/19     ---     4 mg 7/20   2.62    4 mg 7/21   3.42 -- 7/22   3.69 -- 7/23   2.69 3 mg 7/24    --      3 mg 7/25   2.12 3 mg 7/26   2.19  3 mg  Goal of Therapy:  INR 2-3 Monitor platelets by anticoagulation protocol: Yes  DDI: Augmentin (started 7/22)   Plan:  Will repeat warfarin 3 mg for tonight based on previous dosing/trend.   Perry [name], Perry [name]

## 2017-08-11 NOTE — Care Management Important Message (Signed)
Important Message  Patient Details  Name: Perry [name] MRN: [id number] Date of Birth: 09-19-1970   Medicare Important Message Given:  Yes    Chapman FitchBOWEN, Senora Lacson T, RN 08/11/2017, 4:14 PM

## 2017-08-11 NOTE — Progress Notes (Signed)
Pt was given D/C instructions, vitals were running soft due to dialysis today and that he runs low after dialysis and he was released to the EMS. I called his mother and gave her instructions and alerted her to his follow up appts which she stated he does not got to [facility] but [facility] so I asked her to call and cancel one and schedule with their doctor. Pt was actively hallucinating about snakes as he was leaving this has been a pattern for him this visit.

## 2017-08-11 NOTE — Progress Notes (Signed)
This note also relates to the following rows which could not be included: BP - Cannot attach notes to unvalidated device data  Hd completed  

## 2017-08-13 ENCOUNTER — Emergency Department
Admission: EM | Admit: 2017-08-13 | Discharge: 2017-08-13 | Disposition: A | Discharge status: Home / Self Care | Payer: Medicare Other | Attending: Emergency Medicine | Admitting: Emergency Medicine

## 2017-08-13 ENCOUNTER — Other Ambulatory Visit: Payer: Self-pay

## 2017-08-13 DIAGNOSIS — Z87891 Personal history of nicotine dependence: Secondary | ICD-10-CM | POA: Diagnosis not present

## 2017-08-13 DIAGNOSIS — Z79899 Other long term (current) drug therapy: Secondary | ICD-10-CM | POA: Diagnosis not present

## 2017-08-13 DIAGNOSIS — N186 End stage renal disease: Secondary | ICD-10-CM | POA: Diagnosis not present

## 2017-08-13 DIAGNOSIS — Z992 Dependence on renal dialysis: Secondary | ICD-10-CM | POA: Diagnosis not present

## 2017-08-13 DIAGNOSIS — K625 Hemorrhage of anus and rectum: Secondary | ICD-10-CM | POA: Diagnosis not present

## 2017-08-13 DIAGNOSIS — Z7982 Long term (current) use of aspirin: Secondary | ICD-10-CM | POA: Insufficient documentation

## 2017-08-13 DIAGNOSIS — I120 Hypertensive chronic kidney disease with stage 5 chronic kidney disease or end stage renal disease: Secondary | ICD-10-CM | POA: Diagnosis not present

## 2017-08-13 DIAGNOSIS — Z7901 Long term (current) use of anticoagulants: Secondary | ICD-10-CM | POA: Insufficient documentation

## 2017-08-13 DIAGNOSIS — K922 Gastrointestinal hemorrhage, unspecified: Secondary | ICD-10-CM

## 2017-08-13 LAB — CBC WITH DIFFERENTIAL/PLATELET
BASOS ABS: 0 10*3/uL (ref 0–0.1)
BASOS PCT: 1 %
EOS ABS: 0.2 10*3/uL (ref 0–0.7)
Eosinophils Relative: 4 %
HCT: 38.6 % — ABNORMAL LOW (ref 40.0–52.0)
Hemoglobin: 12.3 g/dL — ABNORMAL LOW (ref 13.0–18.0)
Lymphocytes Relative: 12 %
Lymphs Abs: 0.6 10*3/uL — ABNORMAL LOW (ref 1.0–3.6)
MCH: 27.6 pg (ref 26.0–34.0)
MCHC: 31.9 g/dL — ABNORMAL LOW (ref 32.0–36.0)
MCV: 86.7 fL (ref 80.0–100.0)
MONO ABS: 0.4 10*3/uL (ref 0.2–1.0)
Monocytes Relative: 7 %
Neutro Abs: 3.9 10*3/uL (ref 1.4–6.5)
Neutrophils Relative %: 76 %
PLATELETS: 243 10*3/uL (ref 150–440)
RBC: 4.45 MIL/uL (ref 4.40–5.90)
RDW: 20 % — AB (ref 11.5–14.5)
WBC: 5.1 10*3/uL (ref 3.8–10.6)

## 2017-08-13 LAB — BASIC METABOLIC PANEL
Anion gap: 9 (ref 5–15)
BUN: 24 mg/dL — ABNORMAL HIGH (ref 6–20)
CALCIUM: 8.6 mg/dL — AB (ref 8.9–10.3)
CHLORIDE: 104 mmol/L (ref 98–111)
CO2: 29 mmol/L (ref 22–32)
CREATININE: 7.62 mg/dL — AB (ref 0.61–1.24)
GFR calc non Af Amer: 8 mL/min — ABNORMAL LOW (ref >60)
GFR, EST AFRICAN AMERICAN: 9 mL/min — AB (ref >60)
Glucose, Bld: 97 mg/dL (ref 70–99)
Potassium: 4.4 mmol/L (ref 3.5–5.1)
SODIUM: 142 mmol/L (ref 135–145)

## 2017-08-13 LAB — TYPE AND SCREEN
ABO/RH(D): O POS
ANTIBODY SCREEN: NEGATIVE

## 2017-08-13 LAB — LACTIC ACID, PLASMA: Lactic Acid, Venous: 1.4 mmol/L (ref 0.5–1.9)

## 2017-08-13 LAB — PROTIME-INR
INR: 1.9
PROTHROMBIN TIME: 21.6 s — AB (ref 11.4–15.2)

## 2017-08-13 LAB — AMMONIA: Ammonia: 17 umol/L (ref 9–35)

## 2017-08-13 MED ORDER — HEPARIN SODIUM (PORCINE) 1000 UNIT/ML IJ SOLN
4.0000 mL | INTRAMUSCULAR | Status: DC | PRN
  Administered 2017-08-13: 3600 [IU] via INTRAPERITONEAL
  Filled 2017-08-13 (×2): qty 4

## 2017-08-13 MED ORDER — HEPARIN SOD (PORK) LOCK FLUSH 100 UNIT/ML IV SOLN
500.0000 [IU] | Freq: Once | INTRAVENOUS | Status: AC
  Administered 2017-08-13: 500 [IU] via INTRAVENOUS

## 2017-08-13 MED ORDER — SENNOSIDES-DOCUSATE SODIUM 8.6-50 MG PO TABS: 2.0000 | ORAL_TABLET | Freq: Two times a day (BID) | ORAL | 0 refills | Status: AC

## 2017-08-13 MED ORDER — HEPARIN SODIUM (PORCINE) 5000 UNIT/ML IJ SOLN
INTRAMUSCULAR | Status: AC
  Filled 2017-08-13: qty 2

## 2017-08-13 MED ORDER — SODIUM CHLORIDE 0.9 % IV BOLUS
500.0000 mL | Freq: Once | INTRAVENOUS | Status: AC
  Administered 2017-08-13: 500 mL via INTRAVENOUS

## 2017-08-13 MED ORDER — HEPARIN SOD (PORK) LOCK FLUSH 100 UNIT/ML IV SOLN
INTRAVENOUS | Status: AC
  Administered 2017-08-13: 17:00:00
  Filled 2017-08-13: qty 5
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
## 2017-08-13 NOTE — ED Notes (Signed)
Per IV team consult, ports should be flushed with a total of 2000 units per side with 1000 unit per ml heparin. Port already flushed with 400 units per side, verified dosage with second RN Cassie to flush with 1600 units per side to equal 2000 units.

## 2017-08-13 NOTE — Discharge Instructions (Signed)
Your lab tests today were okay. INR was 1.9. Hemoglobin level was normal. Continue taking all your medicine. Take a stool softener to maintain regular bowel movements and allow hemorrhoid to heal.

## 2017-08-13 NOTE — ED Notes (Signed)
Pt checked for stool in diaper. No stool present. Pt wiped and a small amount of blood present. Pt states he wants to go home.

## 2017-08-13 NOTE — ED Notes (Signed)
Family at bedside. 

## 2017-08-13 NOTE — ED Triage Notes (Signed)
Pt to the ER for rectal bleed. d/c on friday for gi bleed. CVa 3 months ago. was treated here on 7/17 with pne and hypoxia. Nurse came today to admit to a service and has 97.9 Pt is on warfarin and pt had a BM yesterday that had streaks of bright red blood in it. Ate breakfast and tolerated fine. No nausea or vomiting,. no abd pain. BP 99/47, 106/35. Pt asked for o2 in the truck. Pt did have a run of vtach, 4 pvcs in a row and then occasional PVCs.

## 2017-08-14 ENCOUNTER — Telehealth: Payer: Self-pay | Admitting: Gastroenterology

## 2017-08-14 LAB — BLOOD CULTURE ID PANEL (REFLEXED)
ACINETOBACTER BAUMANNII: NOT DETECTED
CANDIDA KRUSEI: NOT DETECTED
CANDIDA PARAPSILOSIS: NOT DETECTED
CANDIDA TROPICALIS: NOT DETECTED
Candida albicans: NOT DETECTED
Candida glabrata: NOT DETECTED
ENTEROCOCCUS SPECIES: NOT DETECTED
Enterobacter cloacae complex: NOT DETECTED
Enterobacteriaceae species: NOT DETECTED
Escherichia coli: NOT DETECTED
HAEMOPHILUS INFLUENZAE: NOT DETECTED
KLEBSIELLA OXYTOCA: NOT DETECTED
KLEBSIELLA PNEUMONIAE: NOT DETECTED
Listeria monocytogenes: NOT DETECTED
Methicillin resistance: DETECTED — AB
Neisseria meningitidis: NOT DETECTED
PROTEUS SPECIES: NOT DETECTED
Pseudomonas aeruginosa: NOT DETECTED
SERRATIA MARCESCENS: NOT DETECTED
STAPHYLOCOCCUS AUREUS BCID: NOT DETECTED
Staphylococcus species: DETECTED — AB
Streptococcus agalactiae: NOT DETECTED
Streptococcus pneumoniae: NOT DETECTED
Streptococcus pyogenes: NOT DETECTED
Streptococcus species: NOT DETECTED

## 2017-08-14 NOTE — Telephone Encounter (Signed)
Patient at dialysis. Mother answered and she's been helping patient with his post hospital care and medications. Patient is now in a wheelchair due to recent stroke. Patient's wife is now deceased. She verbalized understanding that patient would need to sign DPR at next appointment.  Patient's mother contacted regarding [facility] on 08/11/17.   Patient's mother understands to follow up with provider ? On 08/[facility]/17 at 2[facility].  Patient's mother understands discharge instructions? Yes Patient's mother understands medications and regiment? Yes Patient's mother understands to bring all medications to this visit? Yes

## 2017-08-14 NOTE — Telephone Encounter (Signed)
LEFT VM FOR PT TO CALL OFFICE AND SCHEDULE ED FU APT WITH DR. VANGA

## 2017-08-14 NOTE — ED Notes (Addendum)
08/14/17  1620  Notified dr siadecki of positive blood culture. Per dr Marisa Severinsiadecki I called pt to see if pt was feeling ok and if he was febrile. Pt was ok, afebrile, and going to dialysis. Per dr Marisa Severinsiadecki I told them to return to the ED if they started to run a fever, felt sick or had other concerns.

## 2017-08-17 ENCOUNTER — Encounter: Payer: Self-pay | Admitting: Gastroenterology

## 2017-08-17 ENCOUNTER — Telehealth: Payer: Self-pay | Admitting: Gastroenterology

## 2017-08-17 LAB — CULTURE, BLOOD (ROUTINE X 2): Special Requests: ADEQUATE

## 2017-08-17 NOTE — Telephone Encounter (Signed)
Left vm for pt to call office and schedule Fu with Dr. Allegra LaiVanga .Letter send.

## 2017-08-18 ENCOUNTER — Other Ambulatory Visit: Payer: Self-pay

## 2017-08-18 ENCOUNTER — Emergency Department: Payer: Medicare Other

## 2017-08-18 ENCOUNTER — Emergency Department
Admission: EM | Admit: 2017-08-18 | Discharge: 2017-08-18 | Disposition: A | Discharge status: Home / Self Care | Payer: Medicare Other | Attending: Emergency Medicine | Admitting: Emergency Medicine

## 2017-08-18 DIAGNOSIS — Z7901 Long term (current) use of anticoagulants: Secondary | ICD-10-CM | POA: Diagnosis not present

## 2017-08-18 DIAGNOSIS — R0602 Shortness of breath: Secondary | ICD-10-CM | POA: Insufficient documentation

## 2017-08-18 DIAGNOSIS — Z79899 Other long term (current) drug therapy: Secondary | ICD-10-CM | POA: Insufficient documentation

## 2017-08-18 DIAGNOSIS — I509 Heart failure, unspecified: Secondary | ICD-10-CM | POA: Diagnosis not present

## 2017-08-18 DIAGNOSIS — I132 Hypertensive heart and chronic kidney disease with heart failure and with stage 5 chronic kidney disease, or end stage renal disease: Secondary | ICD-10-CM | POA: Diagnosis not present

## 2017-08-18 DIAGNOSIS — J810 Acute pulmonary edema: Secondary | ICD-10-CM | POA: Diagnosis not present

## 2017-08-18 DIAGNOSIS — Z992 Dependence on renal dialysis: Secondary | ICD-10-CM | POA: Diagnosis not present

## 2017-08-18 DIAGNOSIS — Z87891 Personal history of nicotine dependence: Secondary | ICD-10-CM | POA: Diagnosis not present

## 2017-08-18 DIAGNOSIS — N186 End stage renal disease: Secondary | ICD-10-CM | POA: Diagnosis not present

## 2017-08-18 LAB — CBC WITH DIFFERENTIAL/PLATELET
Basophils Absolute: 0.1 10*3/uL (ref 0–0.1)
Basophils Relative: 2 %
Eosinophils Absolute: 0.2 10*3/uL (ref 0–0.7)
Eosinophils Relative: 4 %
HCT: 39.3 % — ABNORMAL LOW (ref 40.0–52.0)
Hemoglobin: 12.3 g/dL — ABNORMAL LOW (ref 13.0–18.0)
Lymphocytes Relative: 15 %
Lymphs Abs: 0.8 10*3/uL — ABNORMAL LOW (ref 1.0–3.6)
MCH: 27.3 pg (ref 26.0–34.0)
MCHC: 31.3 g/dL — AB (ref 32.0–36.0)
MCV: 87 fL (ref 80.0–100.0)
MONO ABS: 0.4 10*3/uL (ref 0.2–1.0)
MONOS PCT: 7 %
Neutro Abs: 3.6 10*3/uL (ref 1.4–6.5)
Neutrophils Relative %: 72 %
Platelets: 277 10*3/uL (ref 150–440)
RBC: 4.51 MIL/uL (ref 4.40–5.90)
RDW: 20 % — ABNORMAL HIGH (ref 11.5–14.5)
WBC: 5.1 10*3/uL (ref 3.8–10.6)

## 2017-08-18 LAB — CULTURE, BLOOD (ROUTINE X 2)
CULTURE: NO GROWTH
Special Requests: ADEQUATE

## 2017-08-18 LAB — COMPREHENSIVE METABOLIC PANEL
ALT: 24 U/L (ref 0–44)
AST: 32 U/L (ref 15–41)
Albumin: 3 g/dL — ABNORMAL LOW (ref 3.5–5.0)
Alkaline Phosphatase: 97 U/L (ref 38–126)
Anion gap: 8 (ref 5–15)
BUN: 25 mg/dL — ABNORMAL HIGH (ref 6–20)
CALCIUM: 8.1 mg/dL — AB (ref 8.9–10.3)
CHLORIDE: 105 mmol/L (ref 98–111)
CO2: 30 mmol/L (ref 22–32)
Creatinine, Ser: 7.63 mg/dL — ABNORMAL HIGH (ref 0.61–1.24)
GFR calc non Af Amer: 8 mL/min — ABNORMAL LOW (ref >60)
GFR, EST AFRICAN AMERICAN: 9 mL/min — AB (ref >60)
GLUCOSE: 88 mg/dL (ref 70–99)
Potassium: 5.6 mmol/L — ABNORMAL HIGH (ref 3.5–5.1)
Sodium: 143 mmol/L (ref 135–145)
Total Bilirubin: 1 mg/dL (ref 0.3–1.2)
Total Protein: 6.1 g/dL — ABNORMAL LOW (ref 6.5–8.1)

## 2017-08-18 LAB — PROTIME-INR
INR: 1.4
Prothrombin Time: 17 seconds — ABNORMAL HIGH (ref 11.4–15.2)

## 2017-08-18 LAB — PROCALCITONIN: PROCALCITONIN: 0.43 ng/mL

## 2017-08-18 LAB — LACTIC ACID, PLASMA: LACTIC ACID, VENOUS: 1.3 mmol/L (ref 0.5–1.9)

## 2017-08-18 LAB — TROPONIN I: Troponin I: 0.03 ng/mL (ref <0.03)

## 2017-08-18 LAB — BRAIN NATRIURETIC PEPTIDE: B Natriuretic Peptide: >4500 pg/mL — ABNORMAL HIGH (ref 0.0–100.0)

## 2017-08-18 MED ORDER — ENOXAPARIN SODIUM 100 MG/ML ~~LOC~~ SOLN
1.0000 mg/kg | Freq: Once | SUBCUTANEOUS | Status: AC
  Administered 2017-08-18: 100 mg via SUBCUTANEOUS
  Filled 2017-08-18: qty 1

## 2017-08-18 MED ORDER — IOPAMIDOL (ISOVUE-370) INJECTION 76%
75.0000 mL | Freq: Once | INTRAVENOUS | Status: AC | PRN
  Administered 2017-08-18: 75 mL via INTRAVENOUS
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
## 2017-08-18 NOTE — ED Notes (Signed)
Mom signed discharge paperwork and is going home to wait for transport

## 2017-08-18 NOTE — ED Notes (Signed)
AAOx3.  Skin warm and dry.  NAD 

## 2017-08-18 NOTE — ED Triage Notes (Signed)
[facility] EMS states pt was lying in his hospital bed at home and was slouched down in the bed and was 95% on RA with EMS. [facility] so [facility] was applied and O2 came up to 100%. On arrival pt 97% on RA at this time, EDP in rm on arrival. Pt is a dialysis pt.

## 2017-08-18 NOTE — Discharge Instructions (Signed)
Go to dialysis today at noon as scheduled.  Return to the ED for any concerns.  It was a pleasure to take care of you today, and thank you for coming to our emergency department.  If you have any questions or concerns before leaving please ask the nurse to grab me and I'm more than happy to go through your aftercare instructions again.  If you were prescribed any opioid pain medication today such as Norco, Vicodin, Percocet, morphine, hydrocodone, or oxycodone please make sure you do not drive when you are taking this medication as it can alter your ability to drive safely.  If you have any concerns once you are home that you are not improving or are in fact getting worse before you can make it to your follow-up appointment, please do not hesitate to call 911 and come back for further evaluation.  Merrily BrittleNeil Yul Diana, MD  Results for orders placed or performed during the hospital encounter of 08/18/17  Lactic acid, plasma  Result Value Ref Range   Lactic Acid, Venous 1.3 0.5 - 1.9 mmol/L  Comprehensive metabolic panel  Result Value Ref Range   Sodium 143 135 - 145 mmol/L   Potassium 5.6 (H) 3.5 - 5.1 mmol/L   Chloride 105 98 - 111 mmol/L   CO2 30 22 - 32 mmol/L   Glucose, Bld 88 70 - 99 mg/dL   BUN 25 (H) 6 - 20 mg/dL   Creatinine, Ser 1.617.63 (H) 0.61 - 1.24 mg/dL   Calcium 8.1 (L) 8.9 - 10.3 mg/dL   Total Protein 6.1 (L) 6.5 - 8.1 g/dL   Albumin 3.0 (L) 3.5 - 5.0 g/dL   AST 32 15 - 41 U/L   ALT 24 0 - 44 U/L   Alkaline Phosphatase 97 38 - 126 U/L   Total Bilirubin 1.0 0.3 - 1.2 mg/dL   GFR calc non Af Amer 8 (L) >60 mL/min   GFR calc Af Amer 9 (L) >60 mL/min   Anion gap 8 5 - 15  Troponin I  Result Value Ref Range   Troponin I 0.03 (HH) <0.03 ng/mL  Procalcitonin  Result Value Ref Range   Procalcitonin 0.43 ng/mL  Brain natriuretic peptide  Result Value Ref Range   B Natriuretic Peptide >4,500.0 (H) 0.0 - 100.0 pg/mL  Protime-INR  Result Value Ref Range   Prothrombin Time 17.0  (H) 11.4 - 15.2 seconds   INR 1.40   CBC with Differential  Result Value Ref Range   WBC 5.1 3.8 - 10.6 K/uL   RBC 4.51 4.40 - 5.90 MIL/uL   Hemoglobin 12.3 (L) 13.0 - 18.0 g/dL   HCT 09.639.3 (L) 04.540.0 - 40.952.0 %   MCV 87.0 80.0 - 100.0 fL   MCH 27.3 26.0 - 34.0 pg   MCHC 31.3 (L) 32.0 - 36.0 g/dL   RDW 81.120.0 (H) 91.411.5 - 78.214.5 %   Platelets 277 150 - 440 K/uL   Neutrophils Relative % 72 %   Neutro Abs 3.6 1.4 - 6.5 K/uL   Lymphocytes Relative 15 %   Lymphs Abs 0.8 (L) 1.0 - 3.6 K/uL   Monocytes Relative 7 %   Monocytes Absolute 0.4 0.2 - 1.0 K/uL   Eosinophils Relative 4 %   Eosinophils Absolute 0.2 0 - 0.7 K/uL   Basophils Relative 2 %   Basophils Absolute 0.1 0 - 0.1 K/uL   Ct Angio Chest Pe W/cm &/or Wo Cm  Result Date: 08/18/2017 CLINICAL DATA:  Shortness of breath. EXAM:  CT ANGIOGRAPHY CHEST WITH CONTRAST TECHNIQUE: Multidetector CT imaging of the chest was performed using the standard protocol during bolus administration of intravenous contrast. Multiplanar CT image reconstructions and MIPs were obtained to evaluate the vascular anatomy. CONTRAST:  75mL ISOVUE-370 IOPAMIDOL (ISOVUE-370) INJECTION 76% COMPARISON:  Radiograph earlier this day. Noncontrast chest CT 05/07/2015 FINDINGS: Cardiovascular: There are no filling defects within the pulmonary arteries to the segmental level to suggest pulmonary embolus. Subsegmental branches cannot be assessed due to patient motion artifact. Multi chamber cardiomegaly. Contrast refluxes into the hepatic veins and IVC. Coronary artery calcifications. Thoracic aorta is normal in caliber with mild atherosclerosis. Right subclavian venous stent, no contrast visualized in the stent. Left-sided dialysis catheter with tip in the right atrium. Numerous right chest wall collaterals including drainage into the azygos system. Mediastinum/Nodes: No bulky mediastinal or hilar adenopathy. Prominent left axillary nodes measure up to 12 mm. Probable pleural fluid tracking  in the right hilum rather than adenopathy. The esophagus is nondistended. No thyroid nodule. Lungs/Pleura: Moderate to large right and small left pleural effusion. Fluid tracks throughout fissures bilaterally. Perihilar ground-glass opacities suspicious for pulmonary edema. Compressive atelectasis in both lower lobes adjacent to pleural fluid. Breathing motion artifact partially obscures detailed evaluation. Upper Abdomen: Gastric sleeve resection of the stomach. Cystic changes throughout both kidneys. No acute findings. Musculoskeletal: Sequela of renal osteodystrophy with diffusely increased bone mineral density. Review of the MIP images confirms the above findings. IMPRESSION: 1. No pulmonary embolus. 2. CHF with cardiomegaly and bilateral pleural effusions, moderate to large on the right and small to moderate on the left. Ground-glass opacities consistent with pulmonary edema. Contrast refluxing into the hepatic veins and IVC consistent with elevated right heart pressures. 3. Prominent left axillary nodes measuring up to 12 mm, presumably reactive. 4.  Aortic Atherosclerosis (ICD10-I70.0). 5. Right subclavian venous stent. No flow visualized in the stent, which may be due to phase of contrast versus stent occlusion. Numerous chest wall collaterals. Electronically Signed   By: Rubye Oaks M.D.   On: 08/18/2017 04:36   US Venous Img Upper Uni Left  Result Date: 07/29/2017 CLINICAL DATA:  Left upper extremity edema and occluded left arm dialysis graft. EXAM: LEFT UPPER EXTREMITY VENOUS DOPPLER ULTRASOUND TECHNIQUE: Gray-scale sonography with graded compression, as well as color Doppler and duplex ultrasound were performed to evaluate the upper extremity deep venous system from the level of the subclavian vein and including the jugular, axillary, basilic, radial, ulnar and upper cephalic vein. Spectral Doppler was utilized to evaluate flow at rest and with distal augmentation maneuvers. COMPARISON:  None.  FINDINGS: Contralateral Subclavian Vein: Respiratory phasicity is normal and symmetric with the symptomatic side. No evidence of thrombus. Normal compressibility. Internal Jugular Vein: There is some nonocclusive thrombus visualized in the left internal jugular vein. This may relate to prior catheter placement. Subclavian Vein: No evidence of thrombus. Normal compressibility, respiratory phasicity and response to augmentation. Axillary Vein: No evidence of thrombus. Normal compressibility, respiratory phasicity and response to augmentation. Cephalic Vein: No evidence of thrombus. Normal compressibility, respiratory phasicity and response to augmentation. Basilic Vein: No evidence of thrombus. Normal compressibility, respiratory phasicity and response to augmentation. Brachial Veins: 1 of 2 visualized deep brachial veins in the left upper arm shows evidence of thrombus. Another brachial vein appears widely patent. Radial Veins: No evidence of thrombus. Normal compressibility, respiratory phasicity and response to augmentation. Ulnar Veins: No evidence of thrombus. Normal compressibility, respiratory phasicity and response to augmentation. Venous Reflux:  None visualized. Other Findings:  Occluded dialysis graft versus fistula in the left arm. IMPRESSION: Thrombus in 1 of 2 paired deep brachial veins in the left upper arm. Nonocclusive thrombus also identified in the left internal jugular vein which may be related to an indwelling dialysis catheter. Electronically Signed   By: Irish Lack M.D.   On: 07/29/2017 11:15   Dg Chest Port 1 View  Result Date: 08/18/2017 CLINICAL DATA:  Shortness of breath. EXAM: PORTABLE CHEST 1 VIEW COMPARISON:  Chest radiograph August 02, 2017 FINDINGS: Cardiomediastinal silhouette is normal, calcified aortic arch. Diffuse interstitial prominence with patchy bibasilar airspace opacities with small pleural effusions. No pneumothorax. RIGHT subclavian vascular stents. Tunneled dialysis  catheter via LEFT internal jugular venous approach with distal tip projecting in cavoatrial junction. Soft tissue planes and included osseous structures are non suspicious. IMPRESSION: Interstitial and alveolar airspace opacities seen with pulmonary edema, less likely pneumonia. Small pleural effusions. LEFT dialysis catheter in situ. Aortic Atherosclerosis (ICD10-I70.0). Electronically Signed   By: Awilda Metro M.D.   On: 08/18/2017 02:38   Dg Chest Port 1 View  Result Date: 08/02/2017 CLINICAL DATA:  47 year old male with a history of hypotension EXAM: PORTABLE CHEST 1 VIEW COMPARISON:  07/11/2016 FINDINGS: Cardiomediastinal silhouette likely unchanged, partially obscured by overlying lung/pleural disease. New opacity in the right hilar region and right inferior lung. No pneumothorax. Vascular stents of the right upper extremity. Left IJ split tip hemodialysis catheter in place. IMPRESSION: New airspace opacity of the right hilar region and lower right lung, potentially combination of atelectasis/consolidation, edema, and/or pleural effusion. Vascular stents of the right upper extremity. Left IJ hemodialysis catheter Electronically Signed   By: Gilmer Mor D.O.   On: 08/02/2017 20:16

## 2017-08-21 ENCOUNTER — Encounter: Payer: Self-pay | Admitting: Emergency Medicine

## 2017-08-21 ENCOUNTER — Other Ambulatory Visit: Payer: Self-pay

## 2017-08-21 ENCOUNTER — Emergency Department: Payer: Medicare Other

## 2017-08-21 ENCOUNTER — Emergency Department
Admission: EM | Admit: 2017-08-21 | Discharge: 2017-08-21 | Disposition: A | Discharge status: Other Acute Inpatient Hospital | Payer: Medicare Other | Attending: Emergency Medicine | Admitting: Emergency Medicine

## 2017-08-21 DIAGNOSIS — Z7901 Long term (current) use of anticoagulants: Secondary | ICD-10-CM | POA: Diagnosis not present

## 2017-08-21 DIAGNOSIS — N186 End stage renal disease: Secondary | ICD-10-CM | POA: Diagnosis not present

## 2017-08-21 DIAGNOSIS — I9589 Other hypotension: Secondary | ICD-10-CM | POA: Diagnosis not present

## 2017-08-21 DIAGNOSIS — R0602 Shortness of breath: Secondary | ICD-10-CM | POA: Diagnosis present

## 2017-08-21 DIAGNOSIS — Z79899 Other long term (current) drug therapy: Secondary | ICD-10-CM | POA: Insufficient documentation

## 2017-08-21 DIAGNOSIS — Z992 Dependence on renal dialysis: Secondary | ICD-10-CM | POA: Diagnosis not present

## 2017-08-21 DIAGNOSIS — Z87891 Personal history of nicotine dependence: Secondary | ICD-10-CM | POA: Diagnosis not present

## 2017-08-21 DIAGNOSIS — I509 Heart failure, unspecified: Secondary | ICD-10-CM | POA: Insufficient documentation

## 2017-08-21 DIAGNOSIS — I132 Hypertensive heart and chronic kidney disease with heart failure and with stage 5 chronic kidney disease, or end stage renal disease: Secondary | ICD-10-CM | POA: Diagnosis not present

## 2017-08-21 LAB — COMPREHENSIVE METABOLIC PANEL
ALT: 23 U/L (ref 0–44)
AST: 13 U/L — ABNORMAL LOW (ref 15–41)
Albumin: 3.2 g/dL — ABNORMAL LOW (ref 3.5–5.0)
Alkaline Phosphatase: 104 U/L (ref 38–126)
Anion gap: 10 (ref 5–15)
BUN: 28 mg/dL — ABNORMAL HIGH (ref 6–20)
CO2: 26 mmol/L (ref 22–32)
CREATININE: 8.58 mg/dL — AB (ref 0.61–1.24)
Calcium: 8.2 mg/dL — ABNORMAL LOW (ref 8.9–10.3)
Chloride: 107 mmol/L (ref 98–111)
GFR, EST AFRICAN AMERICAN: 8 mL/min — AB (ref >60)
GFR, EST NON AFRICAN AMERICAN: 7 mL/min — AB (ref >60)
Glucose, Bld: 92 mg/dL (ref 70–99)
Potassium: 4.3 mmol/L (ref 3.5–5.1)
Sodium: 143 mmol/L (ref 135–145)
TOTAL PROTEIN: 6.5 g/dL (ref 6.5–8.1)
Total Bilirubin: 0.7 mg/dL (ref 0.3–1.2)

## 2017-08-21 LAB — CBC WITH DIFFERENTIAL/PLATELET
Basophils Absolute: 0.1 10*3/uL (ref 0–0.1)
Basophils Relative: 2 %
Eosinophils Absolute: 0.2 10*3/uL (ref 0–0.7)
Eosinophils Relative: 4 %
HCT: 40.9 % (ref 40.0–52.0)
Hemoglobin: 12.7 g/dL — ABNORMAL LOW (ref 13.0–18.0)
LYMPHS ABS: 0.7 10*3/uL — AB (ref 1.0–3.6)
LYMPHS PCT: 17 %
MCH: 27.1 pg (ref 26.0–34.0)
MCHC: 31.2 g/dL — ABNORMAL LOW (ref 32.0–36.0)
MCV: 86.9 fL (ref 80.0–100.0)
Monocytes Absolute: 0.3 10*3/uL (ref 0.2–1.0)
Monocytes Relative: 8 %
NEUTROS ABS: 3.1 10*3/uL (ref 1.4–6.5)
NEUTROS PCT: 69 %
Platelets: 316 10*3/uL (ref 150–440)
RBC: 4.7 MIL/uL (ref 4.40–5.90)
RDW: 20.4 % — ABNORMAL HIGH (ref 11.5–14.5)
WBC: 4.4 10*3/uL (ref 3.8–10.6)

## 2017-08-21 LAB — TROPONIN I

## 2017-08-21 LAB — LACTIC ACID, PLASMA: Lactic Acid, Venous: 1.6 mmol/L (ref 0.5–1.9)

## 2017-08-21 NOTE — ED Notes (Signed)
Called [facility] [phone] and notified staff of pt reason for missing appt today per pt request. Staff state to call facility back when pt is discharged to see if they can accommodate him later today.

## 2017-08-21 NOTE — ED Notes (Signed)
[phone] and notified staff of pt's discharge from ER and transport to dialysis center.

## 2017-08-21 NOTE — ED Notes (Signed)
[facility]  CALLED  FOR  TRANSPORT  TO  DIALYSIS

## 2017-08-21 NOTE — ED Notes (Signed)
Called [facility] [phone] and spoke to nursing staff, explained pt was discharged from ED and needed to know if it would be possible to still do his dialysis today. Nursing staff state they will be able to work pt in whenever he arrives. Pt updated on plan of care.

## 2017-08-21 NOTE — ED Notes (Signed)

## 2017-08-21 NOTE — ED Notes (Signed)
Attempted to call pt's mother per pt request but was unable to reach mother or leave voicemail.
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
## 2017-08-21 NOTE — Discharge Instructions (Addendum)
Call your dialysis center for a makeup session as soon as possible if available. Otherwise, follow up at your next scheduled session. Continue taking all your medications as usual.

## 2017-08-21 NOTE — ED Triage Notes (Signed)
Pt presents to ED via [facility] from home c/o shortness of breath and hypotension. Pt was supposed to be going to dialysis today but was transported to ED instead d/t BP 84/28. EMS state O2 sat 99% on RA, but pt placed on 2L for comfort. EMS report bil rhonchi. BP on arrival 126/48.

## 2017-08-23 LAB — CULTURE, BLOOD (ROUTINE X 2)
CULTURE: NO GROWTH
Culture: NO GROWTH
Special Requests: ADEQUATE
Special Requests: ADEQUATE

## 2017-08-25 ENCOUNTER — Inpatient Hospital Stay: Payer: Medicare Other | Admitting: Family Medicine

## 2017-08-27 DIAGNOSIS — I639 Cerebral infarction, unspecified: Secondary | ICD-10-CM | POA: Insufficient documentation

## 2017-08-27 NOTE — Progress Notes (Deleted)
Cardiology Office Note  Date:  08/27/2017   ID:  Perry [name], DOB 11-Mar-1970, MRN [id number]  PCP:  [facility]   No chief complaint on file.   HPI:  Perry [name] is a 47 y.o. male with a  history of seizures,  sleep apnea,  end-stage renal disease on HD Since 2011,  strokeMarch 2019 with left-sided hemiparesis, chronic pain, bedbound,  requires total care due to previous CVA,  recently discharge from the hospital, brought in for hypotension Paramedics noted blood pressure in the 60s systolic  recently discharged 29/56/2130 and before he could get home was brought back into the hospital by EMTs for hypotension Admitted with sepsis started on broad-spectrum antibiotics  [facility] and has no dialysis chair as outpatient along with patient's family not able to care for him at home. Was hospitalized last week Was reapplying for Medicaid to assist with sniff placement  in the emergency room patient was noted with a blood pressure at 59/25,which improved after 500 mL bolus and IV antibiotics to 125/45.  Oxygen saturation was 91% on room air.    Echocardiogram this admission for hypotension Significant aortic valve regurgitation, MR Unable to exclude septal defect with shunt Normal ejection fraction Right ventricle is dilated and moderately reduced function  Outpatient records reviewed From [facility] 04/29/2017 echocardiogram Normal LV function trace MR, mild to moderate AI, right-sided pressures 15-20 Blood pressure 88/48  Transesophageal echocardiogram was performed 05/02/2017 Procedure performed while patient was intubated in the ICU, propofol used Moderate aortic valve regurgitation Trace to mild MR No septal defect noted Mild to moderate concentric LVH Ejection fraction normal  PMH:   has a past medical history of Acute meniscal tear of left knee, Acute meniscal tear of right knee, Anxiety, Chronic back pain,  Dialysis patient [facility]), Fracture of transverse process of thoracic vertebra (HCC), GERD (gastroesophageal reflux disease), Hemodialysis patient (HCC), High blood pressure, Kidney failure, Seizures (HCC), and Sleep apnea.  PSH:    Past Surgical History:  Procedure Laterality Date  . AV FISTULA PLACEMENT     left arm -   . AV FISTULA PLACEMENT Left 06/26/2014   Procedure: Left arm AV fistula creation;  Surgeon: Annice Needy, MD;  Location: [facility] ORS;  Service: Vascular;  Laterality: Left;  . AV FISTULA REPAIR    . av fistula right upper arm     . av fistula right wrist     . LAPAROSCOPIC GASTRIC SLEEVE RESECTION N/A 05/05/2014   Procedure: LAPAROSCOPIC GASTRIC SLEEVE RESECTION;  Surgeon: Luretha Murphy, MD;  Location: [facility] ORS;  Service: General;  Laterality: N/A;  . PERIPHERAL VASCULAR CATHETERIZATION N/A 06/02/2014   Procedure: A/V Shuntogram/Fistulagram;  Surgeon: Annice Needy, MD;  Location: [facility] INVASIVE CV LAB;  Service: Cardiovascular;  Laterality: N/A;  . PERIPHERAL VASCULAR CATHETERIZATION N/A 06/02/2014   Procedure: A/V Shunt Intervention;  Surgeon: Annice Needy, MD;  Location: [facility] INVASIVE CV LAB;  Service: Cardiovascular;  Laterality: N/A;  . PERIPHERAL VASCULAR CATHETERIZATION N/A 06/02/2014   Procedure: Dialysis/Perma Catheter Insertion;  Surgeon: Annice Needy, MD;  Location: [facility] INVASIVE CV LAB;  Service: Cardiovascular;  Laterality: N/A;  . PERIPHERAL VASCULAR CATHETERIZATION N/A 12/08/2014   Procedure: Dialysis/Perma Catheter Insertion;  Surgeon: Annice Needy, MD;  Location: [facility] INVASIVE CV LAB;  Service: Cardiovascular;  Laterality: N/A;  . PERIPHERAL VASCULAR CATHETERIZATION N/A 12/15/2014   Procedure: Dialysis/Perma Catheter Insertion;  Surgeon: Annice Needy, MD;  Location: [facility] INVASIVE CV LAB;  Service: Cardiovascular;  Laterality: N/A;  . PERIPHERAL VASCULAR CATHETERIZATION  12/15/2014   Procedure: Dialysis/Perma Catheter Removal;  Surgeon: Annice Needy, MD;  Location: [facility] INVASIVE  CV LAB;  Service: Cardiovascular;;  . stents in left lower forearm      due to clotting in Av fistula   . thrombectomies to left lower foreram fistula     . tumor removed palm of right hand      benign     Current Outpatient Medications  Medication Sig Dispense Refill  . acetaminophen (TYLENOL) 325 MG tablet Take 650 mg by mouth every 6 (six) hours as needed for mild pain or moderate pain.    [name] alprazolam (XANAX) 2 MG tablet Take 1 tablet by mouth at bedtime as needed.  0  . amoxicillin-clavulanate (AUGMENTIN) 500-125 MG tablet Take 1 tablet (500 mg total) by mouth daily. (Patient not taking: Reported on 08/21/2017) 4 tablet 0  . aspirin 81 MG chewable tablet Chew 1 tablet (81 mg total) by mouth daily. 180 tablet 0  . calcium acetate (PHOSLO) 667 MG capsule Take 3 capsules (2,001 mg total) by mouth 3 (three) times daily with meals. 90 capsule 2  . cinacalcet (SENSIPAR) 60 MG tablet Take 1 tablet (60 mg total) by mouth at bedtime. 30 tablet 0  . escitalopram (LEXAPRO) 20 MG tablet Take 20 mg by mouth at bedtime.  5  . gabapentin (NEURONTIN) 300 MG capsule Take 300 mg by mouth at bedtime.  0  . lidocaine-prilocaine (EMLA) cream Apply 1 application topically as needed. (Patient taking differently: Apply 1 application topically as needed (pain). ) 30 g 0  . midodrine (PROAMATINE) 10 MG tablet Take 1.5 tablets (15 mg total) by mouth every 8 (eight) hours. 90 tablet 2  . multivitamin (RENA-VIT) TABS tablet Take 1 tablet by mouth at bedtime. 180 tablet 0  . pantoprazole (PROTONIX) 20 MG tablet Take 1 tablet (20 mg total) by mouth daily. 30 tablet 1  . polyethylene glycol (MIRALAX / GLYCOLAX) packet Take 17 g by mouth daily as needed for mild constipation. 14 each 0  . senna-docusate (SENOKOT-S) 8.6-50 MG tablet Take 2 tablets by mouth 2 (two) times daily. 120 tablet 0  . sevelamer carbonate (RENVELA) 800 MG tablet Take 4 tablets (3,200 mg total) by mouth 3 (three) times daily with meals. (Patient not  taking: Reported on 08/21/2017) 90 tablet 0  . traMADol (ULTRAM) 50 MG tablet Take 1 tablet (50 mg total) by mouth every 12 (twelve) hours as needed for moderate pain. (Patient not taking: Reported on 08/21/2017) 10 tablet 0  . warfarin (COUMADIN) 4 MG tablet Take 1 tablet (4 mg total) by mouth daily. 30 tablet 1   No current facility-administered medications for this visit.      Allergies:   Depakote [divalproex sodium] and Hydromorphone   Social History:  The patient  reports that he quit smoking about 4 years ago. His smoking use included cigarettes. He has a 10.00 pack-year smoking history. He has never used smokeless tobacco. He reports that he does not drink alcohol or use drugs.   Family History:   family history includes High blood pressure in his mother.    Review of Systems: ROS   PHYSICAL EXAM: VS:  There were no vitals taken for this visit. , BMI There is no height or weight on file to calculate BMI. GEN: Well nourished, well developed, in no acute distress HEENT: normal Neck: no JVD, carotid bruits, or masses Cardiac: RRR; no  murmurs, rubs, or gallops,no edema  Respiratory:  clear to auscultation bilaterally, normal work of breathing GI: soft, nontender, nondistended, + BS MS: no deformity or atrophy Skin: warm and dry, no rash Neuro:  Strength and sensation are intact Psych: euthymic mood, full affect    Recent Labs: 08/06/2017: Magnesium 3.0 08/18/2017: B Natriuretic Peptide >4,500.0 08/21/2017: ALT 23; BUN 28; Creatinine, Ser 8.58; Hemoglobin 12.7; Platelets 316; Potassium 4.3; Sodium 143    Lipid Panel No results found for: CHOL, HDL, LDLCALC, TRIG    Wt Readings from Last 3 Encounters:  08/21/17 208 lb 1.6 oz (94.4 kg)  08/18/17 220 lb (99.8 kg)  08/13/17 220 lb (99.8 kg)       ASSESSMENT AND PLAN:  No diagnosis found.  1. Aortic valve regurgitation Also with mitral valve regurgitation [facility] Extent of regurgitation/valve disease dramatically worse now compared to previously detailed echocardiogram and transesophageal echo April 2019 -Possibly secondary to higher fluid state/fluid overload, and need for dialysis Also possible change in valve pathology -Poor candidate for transesophageal echo. High risk of aspiration Previous transesophageal echo performed while he was intubated and propofol used [facility] -Would recommend aggressive hemodialysis as blood pressure tolerates Reimage at a later date after several rounds of dialysis. Using TTE If felt to be a candidate at a later date could try TEE -Currently would not tolerate sedation given his hypotension Suspect regurgitation will be a dynamic process, worse with fluid overload -Not a good candidate for any surgical procedures for his valves given underlying medical issues -Would recommend treating conservatively at this time  2. Hypoxia Noted previously,  Likely secondary to fluid overload, pulmonary hypertension Likely would improve with dialysis  3. Hypotension Possibly exacerbated by stroke and sepsis Will make dialysis very challenging Likely exacerbated by valve disease  4. CVA, hemiparesis High risk of aspiration pneumonia High risk of skin breakdown, bedsores, nutrition problems Given his debilities, will be requiring full care, hoyer lift Consider palliative consult given the extent of medical issues  Disposition:   F/U  6 months  No orders of the defined types were placed in this encounter.    Signed, Dossie Arbourim Aldous Housel, M.D., Ph.D. 08/27/2017  [facility], [place] [phone]

## 2017-08-29 ENCOUNTER — Ambulatory Visit: Payer: Medicare Other | Admitting: Cardiovascular Disease

## 2017-09-20 ENCOUNTER — Encounter (INDEPENDENT_AMBULATORY_CARE_PROVIDER_SITE_OTHER): Payer: Medicare Other | Admitting: Nurse Practitioner

## 2017-09-20 ENCOUNTER — Encounter (INDEPENDENT_AMBULATORY_CARE_PROVIDER_SITE_OTHER): Payer: Medicare Other

## 2017-09-20 ENCOUNTER — Other Ambulatory Visit (INDEPENDENT_AMBULATORY_CARE_PROVIDER_SITE_OTHER): Payer: Medicare Other

## 2017-09-21 ENCOUNTER — Inpatient Hospital Stay
Admission: EM | Admit: 2017-09-21 | Discharge: 2017-09-27 | DRG: 871 | Disposition: A | Discharge status: Other Acute Inpatient Hospital | Payer: Medicare Other | Attending: Internal Medicine | Admitting: Internal Medicine

## 2017-09-21 ENCOUNTER — Emergency Department: Payer: Medicare Other

## 2017-09-21 ENCOUNTER — Other Ambulatory Visit: Payer: Self-pay

## 2017-09-21 DIAGNOSIS — A4102 Sepsis due to Methicillin resistant Staphylococcus aureus: Secondary | ICD-10-CM | POA: Diagnosis present

## 2017-09-21 DIAGNOSIS — R0602 Shortness of breath: Secondary | ICD-10-CM | POA: Diagnosis not present

## 2017-09-21 DIAGNOSIS — I951 Orthostatic hypotension: Secondary | ICD-10-CM | POA: Diagnosis present

## 2017-09-21 DIAGNOSIS — J9601 Acute respiratory failure with hypoxia: Secondary | ICD-10-CM | POA: Diagnosis present

## 2017-09-21 DIAGNOSIS — I700 Atherosclerosis of aorta: Secondary | ICD-10-CM | POA: Diagnosis present

## 2017-09-21 DIAGNOSIS — R532 Functional quadriplegia: Secondary | ICD-10-CM | POA: Diagnosis present

## 2017-09-21 DIAGNOSIS — Z79899 Other long term (current) drug therapy: Secondary | ICD-10-CM

## 2017-09-21 DIAGNOSIS — Z8619 Personal history of other infectious and parasitic diseases: Secondary | ICD-10-CM | POA: Diagnosis not present

## 2017-09-21 DIAGNOSIS — I120 Hypertensive chronic kidney disease with stage 5 chronic kidney disease or end stage renal disease: Secondary | ICD-10-CM | POA: Diagnosis present

## 2017-09-21 DIAGNOSIS — Z23 Encounter for immunization: Secondary | ICD-10-CM

## 2017-09-21 DIAGNOSIS — Z7901 Long term (current) use of anticoagulants: Secondary | ICD-10-CM

## 2017-09-21 DIAGNOSIS — T45515A Adverse effect of anticoagulants, initial encounter: Secondary | ICD-10-CM | POA: Diagnosis present

## 2017-09-21 DIAGNOSIS — A419 Sepsis, unspecified organism: Secondary | ICD-10-CM

## 2017-09-21 DIAGNOSIS — F419 Anxiety disorder, unspecified: Secondary | ICD-10-CM | POA: Diagnosis present

## 2017-09-21 DIAGNOSIS — E611 Iron deficiency: Secondary | ICD-10-CM | POA: Diagnosis present

## 2017-09-21 DIAGNOSIS — Z66 Do not resuscitate: Secondary | ICD-10-CM | POA: Diagnosis present

## 2017-09-21 DIAGNOSIS — D631 Anemia in chronic kidney disease: Secondary | ICD-10-CM | POA: Diagnosis present

## 2017-09-21 DIAGNOSIS — G9341 Metabolic encephalopathy: Secondary | ICD-10-CM | POA: Diagnosis present

## 2017-09-21 DIAGNOSIS — Z9884 Bariatric surgery status: Secondary | ICD-10-CM

## 2017-09-21 DIAGNOSIS — Z79891 Long term (current) use of opiate analgesic: Secondary | ICD-10-CM

## 2017-09-21 DIAGNOSIS — Z885 Allergy status to narcotic agent status: Secondary | ICD-10-CM

## 2017-09-21 DIAGNOSIS — G4733 Obstructive sleep apnea (adult) (pediatric): Secondary | ICD-10-CM | POA: Diagnosis present

## 2017-09-21 DIAGNOSIS — Z992 Dependence on renal dialysis: Secondary | ICD-10-CM

## 2017-09-21 DIAGNOSIS — T829XXA Unspecified complication of cardiac and vascular prosthetic device, implant and graft, initial encounter: Secondary | ICD-10-CM

## 2017-09-21 DIAGNOSIS — Z8614 Personal history of Methicillin resistant Staphylococcus aureus infection: Secondary | ICD-10-CM | POA: Diagnosis not present

## 2017-09-21 DIAGNOSIS — N2581 Secondary hyperparathyroidism of renal origin: Secondary | ICD-10-CM | POA: Diagnosis present

## 2017-09-21 DIAGNOSIS — Z7401 Bed confinement status: Secondary | ICD-10-CM

## 2017-09-21 DIAGNOSIS — G894 Chronic pain syndrome: Secondary | ICD-10-CM | POA: Diagnosis present

## 2017-09-21 DIAGNOSIS — I330 Acute and subacute infective endocarditis: Secondary | ICD-10-CM | POA: Diagnosis present

## 2017-09-21 DIAGNOSIS — K219 Gastro-esophageal reflux disease without esophagitis: Secondary | ICD-10-CM | POA: Diagnosis present

## 2017-09-21 DIAGNOSIS — R011 Cardiac murmur, unspecified: Secondary | ICD-10-CM | POA: Diagnosis not present

## 2017-09-21 DIAGNOSIS — G40909 Epilepsy, unspecified, not intractable, without status epilepticus: Secondary | ICD-10-CM | POA: Diagnosis present

## 2017-09-21 DIAGNOSIS — I69354 Hemiplegia and hemiparesis following cerebral infarction affecting left non-dominant side: Secondary | ICD-10-CM | POA: Diagnosis not present

## 2017-09-21 DIAGNOSIS — I080 Rheumatic disorders of both mitral and aortic valves: Secondary | ICD-10-CM | POA: Diagnosis present

## 2017-09-21 DIAGNOSIS — R791 Abnormal coagulation profile: Secondary | ICD-10-CM | POA: Diagnosis present

## 2017-09-21 DIAGNOSIS — R509 Fever, unspecified: Secondary | ICD-10-CM | POA: Diagnosis present

## 2017-09-21 DIAGNOSIS — R05 Cough: Secondary | ICD-10-CM | POA: Diagnosis not present

## 2017-09-21 DIAGNOSIS — N186 End stage renal disease: Secondary | ICD-10-CM | POA: Diagnosis present

## 2017-09-21 DIAGNOSIS — Z22322 Carrier or suspected carrier of Methicillin resistant Staphylococcus aureus: Secondary | ICD-10-CM

## 2017-09-21 DIAGNOSIS — Z87891 Personal history of nicotine dependence: Secondary | ICD-10-CM

## 2017-09-21 DIAGNOSIS — M549 Dorsalgia, unspecified: Secondary | ICD-10-CM | POA: Diagnosis present

## 2017-09-21 DIAGNOSIS — Z888 Allergy status to other drugs, medicaments and biological substances status: Secondary | ICD-10-CM

## 2017-09-21 DIAGNOSIS — E876 Hypokalemia: Secondary | ICD-10-CM | POA: Diagnosis present

## 2017-09-21 DIAGNOSIS — L8990 Pressure ulcer of unspecified site, unspecified stage: Secondary | ICD-10-CM

## 2017-09-21 DIAGNOSIS — R7881 Bacteremia: Secondary | ICD-10-CM | POA: Diagnosis not present

## 2017-09-21 DIAGNOSIS — I361 Nonrheumatic tricuspid (valve) insufficiency: Secondary | ICD-10-CM | POA: Diagnosis not present

## 2017-09-21 DIAGNOSIS — Z7982 Long term (current) use of aspirin: Secondary | ICD-10-CM

## 2017-09-21 LAB — PROTIME-INR
INR: 4.23
Prothrombin Time: 40.4 seconds — ABNORMAL HIGH (ref 11.4–15.2)

## 2017-09-21 LAB — CBC WITH DIFFERENTIAL/PLATELET
BASOS ABS: 0 10*3/uL (ref 0–0.1)
BASOS PCT: 1 %
EOS PCT: 0 %
Eosinophils Absolute: 0 10*3/uL (ref 0–0.7)
HCT: 37.4 % — ABNORMAL LOW (ref 40.0–52.0)
Hemoglobin: 12 g/dL — ABNORMAL LOW (ref 13.0–18.0)
Lymphocytes Relative: 3 %
Lymphs Abs: 0.2 10*3/uL — ABNORMAL LOW (ref 1.0–3.6)
MCH: 26.8 pg (ref 26.0–34.0)
MCHC: 32.1 g/dL (ref 32.0–36.0)
MCV: 83.4 fL (ref 80.0–100.0)
MONO ABS: 0.5 10*3/uL (ref 0.2–1.0)
Monocytes Relative: 5 %
NEUTROS PCT: 91 %
Neutro Abs: 9.2 10*3/uL — ABNORMAL HIGH (ref 1.4–6.5)
PLATELETS: 253 10*3/uL (ref 150–440)
RBC: 4.49 MIL/uL (ref 4.40–5.90)
RDW: 21.1 % — AB (ref 11.5–14.5)
WBC: 10 10*3/uL (ref 3.8–10.6)

## 2017-09-21 LAB — COMPREHENSIVE METABOLIC PANEL
ALT: 18 U/L (ref 0–44)
AST: 27 U/L (ref 15–41)
Albumin: 2.5 g/dL — ABNORMAL LOW (ref 3.5–5.0)
Alkaline Phosphatase: 86 U/L (ref 38–126)
Anion gap: 15 (ref 5–15)
BILIRUBIN TOTAL: 1.4 mg/dL — AB (ref 0.3–1.2)
BUN: 28 mg/dL — AB (ref 6–20)
CALCIUM: 8.1 mg/dL — AB (ref 8.9–10.3)
CHLORIDE: 98 mmol/L (ref 98–111)
CO2: 28 mmol/L (ref 22–32)
CREATININE: 6.03 mg/dL — AB (ref 0.61–1.24)
GFR, EST AFRICAN AMERICAN: 12 mL/min — AB (ref >60)
GFR, EST NON AFRICAN AMERICAN: 10 mL/min — AB (ref >60)
Glucose, Bld: 117 mg/dL — ABNORMAL HIGH (ref 70–99)
Potassium: 3.1 mmol/L — ABNORMAL LOW (ref 3.5–5.1)
Sodium: 141 mmol/L (ref 135–145)
TOTAL PROTEIN: 6.5 g/dL (ref 6.5–8.1)

## 2017-09-21 LAB — LACTIC ACID, PLASMA: LACTIC ACID, VENOUS: 1.5 mmol/L (ref 0.5–1.9)

## 2017-09-21 MED ORDER — ALBUTEROL SULFATE (2.5 MG/3ML) 0.083% IN NEBU: 2.5000 mg | INHALATION_SOLUTION | RESPIRATORY_TRACT | Status: DC | PRN

## 2017-09-21 MED ORDER — SENNOSIDES-DOCUSATE SODIUM 8.6-50 MG PO TABS
2.0000 | ORAL_TABLET | Freq: Two times a day (BID) | ORAL | Status: DC
  Administered 2017-09-21 – 2017-09-26 (×9): 2 via ORAL
  Filled 2017-09-21 (×11): qty 2

## 2017-09-21 MED ORDER — ONDANSETRON HCL 4 MG/2ML IJ SOLN: 4.0000 mg | Freq: Four times a day (QID) | INTRAMUSCULAR | Status: DC | PRN

## 2017-09-21 MED ORDER — ACETAMINOPHEN 325 MG PO TABS
650.0000 mg | ORAL_TABLET | Freq: Four times a day (QID) | ORAL | Status: DC | PRN
  Administered 2017-09-21 – 2017-09-22 (×2): 650 mg via ORAL
  Filled 2017-09-21 (×2): qty 2

## 2017-09-21 MED ORDER — INFLUENZA VAC SPLIT QUAD 0.5 ML IM SUSY
0.5000 mL | PREFILLED_SYRINGE | INTRAMUSCULAR | Status: AC
  Administered 2017-09-23: 0.5 mL via INTRAMUSCULAR
  Filled 2017-09-21: qty 0.5

## 2017-09-21 MED ORDER — SODIUM CHLORIDE 0.9 % IV SOLN
2.0000 g | Freq: Once | INTRAVENOUS | Status: AC
  Administered 2017-09-21: 2 g via INTRAVENOUS
  Filled 2017-09-21: qty 2

## 2017-09-21 MED ORDER — VANCOMYCIN HCL 10 G IV SOLR
1500.0000 mg | Freq: Once | INTRAVENOUS | Status: AC
  Administered 2017-09-21: 1500 mg via INTRAVENOUS
  Filled 2017-09-21: qty 1500

## 2017-09-21 MED ORDER — VANCOMYCIN HCL IN DEXTROSE 1-5 GM/200ML-% IV SOLN
1000.0000 mg | Freq: Once | INTRAVENOUS | Status: DC
  Filled 2017-09-21: qty 200

## 2017-09-21 MED ORDER — SEVELAMER CARBONATE 2.4 G PO PACK
2.4000 g | PACK | Freq: Three times a day (TID) | ORAL | Status: DC
  Administered 2017-09-22 – 2017-09-23 (×3): 2.4 g via ORAL
  Filled 2017-09-21 (×6): qty 1

## 2017-09-21 MED ORDER — ONDANSETRON HCL 4 MG PO TABS: 4.0000 mg | ORAL_TABLET | Freq: Four times a day (QID) | ORAL | Status: DC | PRN

## 2017-09-21 MED ORDER — ESCITALOPRAM OXALATE 10 MG PO TABS
20.0000 mg | ORAL_TABLET | Freq: Every day | ORAL | Status: DC
  Administered 2017-09-21 – 2017-09-26 (×6): 20 mg via ORAL
  Filled 2017-09-21 (×7): qty 2

## 2017-09-21 MED ORDER — ASPIRIN 81 MG PO CHEW
81.0000 mg | CHEWABLE_TABLET | Freq: Every day | ORAL | Status: DC
  Administered 2017-09-22 – 2017-09-23 (×2): 81 mg via ORAL
  Filled 2017-09-21 (×4): qty 1

## 2017-09-21 MED ORDER — MIDODRINE HCL 5 MG PO TABS
15.0000 mg | ORAL_TABLET | Freq: Three times a day (TID) | ORAL | Status: DC
  Administered 2017-09-21 – 2017-09-22 (×2): 15 mg via ORAL
  Filled 2017-09-21 (×4): qty 3

## 2017-09-21 MED ORDER — CINACALCET HCL 30 MG PO TABS
60.0000 mg | ORAL_TABLET | Freq: Every day | ORAL | Status: DC
  Administered 2017-09-21: 60 mg via ORAL
  Filled 2017-09-21 (×2): qty 2

## 2017-09-21 MED ORDER — DEXTROSE 5 % IV SOLN
500.0000 mg | INTRAVENOUS | Status: DC
  Filled 2017-09-21: qty 0.5

## 2017-09-21 MED ORDER — POLYETHYLENE GLYCOL 3350 17 G PO PACK
1.0000 | PACK | Freq: Every day | ORAL | Status: DC | PRN
  Filled 2017-09-21: qty 1

## 2017-09-21 MED ORDER — SENNOSIDES-DOCUSATE SODIUM 8.6-50 MG PO TABS: 1.0000 | ORAL_TABLET | Freq: Every evening | ORAL | Status: DC | PRN

## 2017-09-21 MED ORDER — RENA-VITE PO TABS
1.0000 | ORAL_TABLET | Freq: Every day | ORAL | Status: DC
  Administered 2017-09-21 – 2017-09-26 (×6): 1 via ORAL
  Filled 2017-09-21 (×6): qty 1

## 2017-09-21 MED ORDER — ACETAMINOPHEN 650 MG RE SUPP: 650.0000 mg | Freq: Four times a day (QID) | RECTAL | Status: DC | PRN

## 2017-09-21 MED ORDER — GABAPENTIN 300 MG PO CAPS
300.0000 mg | ORAL_CAPSULE | Freq: Every day | ORAL | Status: DC
  Administered 2017-09-21: 300 mg via ORAL
  Filled 2017-09-21: qty 1

## 2017-09-21 MED ORDER — PANTOPRAZOLE SODIUM 20 MG PO TBEC
20.0000 mg | DELAYED_RELEASE_TABLET | Freq: Every day | ORAL | Status: DC
  Administered 2017-09-22: 20 mg via ORAL
  Filled 2017-09-21 (×2): qty 1

## 2017-09-21 MED ORDER — MIDODRINE HCL 5 MG PO TABS
15.0000 mg | ORAL_TABLET | Freq: Once | ORAL | Status: DC
  Filled 2017-09-21: qty 3

## 2017-09-21 MED ORDER — SODIUM CHLORIDE 0.9 % IV BOLUS: 100.0000 mL | Freq: Once | INTRAVENOUS | Status: DC
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
## 2017-09-21 NOTE — ED Notes (Signed)
Sam RN, aware of bed assigned  

## 2017-09-21 NOTE — ED Notes (Signed)
ED Provider at bedside. 

## 2017-09-21 NOTE — ED Notes (Signed)
Per Aurther Loft in Lab they will add on PT/INR
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
## 2017-09-21 NOTE — ED Triage Notes (Signed)
To ER via [facility] from home. Pt home care nurse stated that patient had fever, given Tylenol PTA. Pt 87% on RA. Pt hypotensive, 99/35, ST 100-110. Pt had dialysis Wednesday. Pt states he has chronic cough.
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
## 2017-09-21 NOTE — ED Notes (Signed)
Per Admitting MD Allena Katz they have thicken liquids on the floor so they can give patient medication when he arrives to the floor. Medication to be sent with patient to floor once report is called.

## 2017-09-21 NOTE — Progress Notes (Signed)
Pharmacy Antibiotic Note  Perry [name] is a 47 y.o. male admitted on 09/21/2017 with pneumonia/sepsis  Pharmacy has been consulted for vancomycin and cefepime dosing. PMH includes stroke and permanent left-sided disability, end-stage renal disease on HD Monday Wednesday Friday (received HD 9/4), chronic hypotension on midodrine. 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   Plan: Vancomycin loading dose of 1500mg  to target a peak of 25 mcg/mL. Maintenance dose will be 1000mg  after every HD session. Goal pre-HD vancomycin level is 15-25 mcg/mL with level to be drawn prior to every 3rd HD session  Cefepime: 2 g on day 1 (2 grams already given in ED) followed by 500 mg every 24 hours  Weight: 189 lb (85.7 kg)  Temp (24hrs), Avg:99.9 F (37.7 C), Min:98.5 F (36.9 C), Max:101.3 F (38.5 C)  Recent Labs  Lab 09/21/17 1754  WBC 10.0  CREATININE 6.03*  LATICACIDVEN 1.5    Estimated Creatinine Clearance: 15.7 mL/min (A) (by C-G formula based on SCr of 6.03 mg/dL (H)).    Allergies  Allergen Reactions  . Depakote [Divalproex Sodium] Other (See Comments)    Hallucinations   . Hydromorphone Other (See Comments)    Antimicrobials this admission: cefepime 9/5 >>  vancomycin 9/5 >>   Microbiology results: 9/5 BCx: pending 9/5 UCx: pending  9/5 MRSA PCR: pending  Thank you for allowing pharmacy to be a part of this patient's care.  Lowella Bandy, PharmD 09/21/2017 7:04 PM

## 2017-09-21 NOTE — Progress Notes (Signed)
Family Meeting Note  Advance Directive:YES  Today a meeting took place with the pt and mother (caregiver)  Patient came in with increasing shortness of breath fever and cough. He is being admitted with sepsis rule out pneumonia. 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 Patient is bedbound. His long-term prognosis is poor. Discuss code status with patient in presence of mother. Patient request DNR.  I'm spent 17 minutes.  Enedina Finner, MD

## 2017-09-21 NOTE — ED Notes (Signed)
Pt brief checked and was dry. Pt repositioned in bed.

## 2017-09-21 NOTE — Progress Notes (Signed)
CODE SEPSIS - PHARMACY COMMUNICATION  **Broad Spectrum Antibiotics should be administered within 1 hour of Sepsis diagnosis**  Time Code Sepsis Called/Page Received: 1755  Antibiotics Ordered: vancomycin and cefepime  Time of 1st antibiotic administration: 1907  Additional action taken by pharmacy: contacted MD for antibiotic orders but was told code sepsis was being cancelled  If necessary, Name of Provider/Nurse Contacted: Lucretia Kern ,PharmD Clinical Pharmacist  09/21/2017  7:42 PM

## 2017-09-21 NOTE — ED Notes (Signed)
Pt states he doesn't make any urine. RN attempted In and Out Cath before pt stated this information. Pt had runny BM and cleaned up. Pt repositioned in bed and resting comfortably at this time. Will inform MD of no urine output.

## 2017-09-21 NOTE — ED Notes (Addendum)
Pt's mother Sherren Kerns [phone] with any updates

## 2017-09-22 DIAGNOSIS — I120 Hypertensive chronic kidney disease with stage 5 chronic kidney disease or end stage renal disease: Secondary | ICD-10-CM

## 2017-09-22 DIAGNOSIS — L8990 Pressure ulcer of unspecified site, unspecified stage: Secondary | ICD-10-CM

## 2017-09-22 DIAGNOSIS — R7881 Bacteremia: Secondary | ICD-10-CM

## 2017-09-22 DIAGNOSIS — Z9884 Bariatric surgery status: Secondary | ICD-10-CM

## 2017-09-22 DIAGNOSIS — Z87891 Personal history of nicotine dependence: Secondary | ICD-10-CM

## 2017-09-22 DIAGNOSIS — Z885 Allergy status to narcotic agent status: Secondary | ICD-10-CM

## 2017-09-22 DIAGNOSIS — Z992 Dependence on renal dialysis: Secondary | ICD-10-CM

## 2017-09-22 DIAGNOSIS — Z888 Allergy status to other drugs, medicaments and biological substances status: Secondary | ICD-10-CM

## 2017-09-22 DIAGNOSIS — R0602 Shortness of breath: Secondary | ICD-10-CM

## 2017-09-22 DIAGNOSIS — Z8614 Personal history of Methicillin resistant Staphylococcus aureus infection: Secondary | ICD-10-CM

## 2017-09-22 DIAGNOSIS — N186 End stage renal disease: Secondary | ICD-10-CM

## 2017-09-22 DIAGNOSIS — Z8619 Personal history of other infectious and parasitic diseases: Secondary | ICD-10-CM

## 2017-09-22 DIAGNOSIS — R05 Cough: Secondary | ICD-10-CM

## 2017-09-22 DIAGNOSIS — M549 Dorsalgia, unspecified: Secondary | ICD-10-CM

## 2017-09-22 DIAGNOSIS — R011 Cardiac murmur, unspecified: Secondary | ICD-10-CM

## 2017-09-22 DIAGNOSIS — I69354 Hemiplegia and hemiparesis following cerebral infarction affecting left non-dominant side: Secondary | ICD-10-CM

## 2017-09-22 LAB — IRON AND TIBC
IRON: 14 ug/dL — AB (ref 45–182)
Saturation Ratios: 7 % — ABNORMAL LOW (ref 17.9–39.5)
TIBC: 201 ug/dL — ABNORMAL LOW (ref 250–450)
UIBC: 187 ug/dL

## 2017-09-22 LAB — BLOOD CULTURE ID PANEL (REFLEXED)
ACINETOBACTER BAUMANNII: NOT DETECTED
CANDIDA ALBICANS: NOT DETECTED
Candida glabrata: NOT DETECTED
Candida krusei: NOT DETECTED
Candida parapsilosis: NOT DETECTED
Candida tropicalis: NOT DETECTED
ENTEROCOCCUS SPECIES: NOT DETECTED
Enterobacter cloacae complex: NOT DETECTED
Enterobacteriaceae species: NOT DETECTED
Escherichia coli: NOT DETECTED
HAEMOPHILUS INFLUENZAE: NOT DETECTED
Klebsiella oxytoca: NOT DETECTED
Klebsiella pneumoniae: NOT DETECTED
LISTERIA MONOCYTOGENES: NOT DETECTED
METHICILLIN RESISTANCE: DETECTED — AB
NEISSERIA MENINGITIDIS: NOT DETECTED
PSEUDOMONAS AERUGINOSA: NOT DETECTED
Proteus species: NOT DETECTED
STAPHYLOCOCCUS AUREUS BCID: DETECTED — AB
STREPTOCOCCUS PNEUMONIAE: NOT DETECTED
STREPTOCOCCUS PYOGENES: NOT DETECTED
STREPTOCOCCUS SPECIES: NOT DETECTED
Serratia marcescens: NOT DETECTED
Staphylococcus species: DETECTED — AB
Streptococcus agalactiae: NOT DETECTED

## 2017-09-22 LAB — FOLATE: Folate: 7.3 ng/mL (ref >5.9)

## 2017-09-22 LAB — AMMONIA: AMMONIA: 15 umol/L (ref 9–35)

## 2017-09-22 LAB — PROTIME-INR
INR: 5.3
Prothrombin Time: 48.2 seconds — ABNORMAL HIGH (ref 11.4–15.2)

## 2017-09-22 LAB — VITAMIN B12: Vitamin B-12: 964 pg/mL — ABNORMAL HIGH (ref 180–914)

## 2017-09-22 LAB — FERRITIN: FERRITIN: 310 ng/mL (ref 24–336)

## 2017-09-22 LAB — PHOSPHORUS: PHOSPHORUS: 3.8 mg/dL (ref 2.5–4.6)

## 2017-09-22 LAB — MRSA PCR SCREENING: MRSA BY PCR: POSITIVE — AB

## 2017-09-22 MED ORDER — MUPIROCIN 2 % EX OINT
1.0000 "application " | TOPICAL_OINTMENT | Freq: Two times a day (BID) | CUTANEOUS | Status: AC
  Administered 2017-09-22 – 2017-09-26 (×9): 1 via NASAL
  Filled 2017-09-22: qty 22

## 2017-09-22 MED ORDER — SODIUM CHLORIDE 0.9 % IV SOLN
1.0000 g | INTRAVENOUS | Status: DC
  Filled 2017-09-22: qty 1

## 2017-09-22 MED ORDER — VITAMIN C 500 MG PO TABS
250.0000 mg | ORAL_TABLET | Freq: Two times a day (BID) | ORAL | Status: DC
  Administered 2017-09-23 – 2017-09-24 (×3): 250 mg via ORAL
  Filled 2017-09-22 (×7): qty 0.5

## 2017-09-22 MED ORDER — VANCOMYCIN HCL IN DEXTROSE 1-5 GM/200ML-% IV SOLN
1000.0000 mg | INTRAVENOUS | Status: DC
  Administered 2017-09-22: 1000 mg via INTRAVENOUS
  Filled 2017-09-22 (×2): qty 200

## 2017-09-22 MED ORDER — ADULT MULTIVITAMIN W/MINERALS CH
1.0000 | ORAL_TABLET | Freq: Every day | ORAL | Status: DC
  Administered 2017-09-23 – 2017-09-26 (×2): 1 via ORAL
  Filled 2017-09-22 (×2): qty 1

## 2017-09-22 MED ORDER — CHLORHEXIDINE GLUCONATE CLOTH 2 % EX PADS: 6.0000 | MEDICATED_PAD | Freq: Every day | CUTANEOUS | Status: DC

## 2017-09-22 MED ORDER — SODIUM CHLORIDE 0.9 % IV BOLUS
250.0000 mL | Freq: Once | INTRAVENOUS | Status: AC
  Administered 2017-09-22: 250 mL via INTRAVENOUS

## 2017-09-22 MED ORDER — CHLORHEXIDINE GLUCONATE CLOTH 2 % EX PADS
6.0000 | MEDICATED_PAD | Freq: Every day | CUTANEOUS | Status: AC
  Administered 2017-09-22 – 2017-09-26 (×5): 6 via TOPICAL

## 2017-09-22 MED ORDER — VITAMIN C 500 MG PO TABS: 250.0000 mg | ORAL_TABLET | Freq: Two times a day (BID) | ORAL | Status: DC

## 2017-09-22 MED ORDER — MIDODRINE HCL 5 MG PO TABS
15.0000 mg | ORAL_TABLET | Freq: Three times a day (TID) | ORAL | Status: DC
  Administered 2017-09-22 – 2017-09-26 (×11): 15 mg via ORAL
  Filled 2017-09-22 (×17): qty 3

## 2017-09-22 MED ORDER — PANTOPRAZOLE SODIUM 40 MG PO TBEC
40.0000 mg | DELAYED_RELEASE_TABLET | Freq: Every day | ORAL | Status: DC
  Administered 2017-09-23 – 2017-09-24 (×2): 40 mg via ORAL
  Filled 2017-09-22 (×3): qty 1

## 2017-09-22 MED ORDER — CINACALCET HCL 30 MG PO TABS
60.0000 mg | ORAL_TABLET | Freq: Every day | ORAL | Status: DC
  Administered 2017-09-22: 60 mg via ORAL
  Filled 2017-09-22 (×2): qty 2

## 2017-09-22 MED ORDER — VANCOMYCIN HCL IN DEXTROSE 1-5 GM/200ML-% IV SOLN: 1000.0000 mg | INTRAVENOUS | Status: DC | PRN

## 2017-09-22 MED ORDER — NEPRO/CARBSTEADY PO LIQD
237.0000 mL | Freq: Two times a day (BID) | ORAL | Status: DC
  Administered 2017-09-23: 237 mL via ORAL

## 2017-09-22 NOTE — Progress Notes (Signed)
Please note patient has an appointment for outpatient Palliative NP home visit on 9/24. CMRN Bevelyn Ngo made aware. Dayna Barker RN, BSN, [facility] [place], hospital liaison [phone]

## 2017-09-22 NOTE — Progress Notes (Signed)
[facility], [place] 09/22/17  Subjective:   Patient presents this time for complains of cough, fever, tachycardia Temperature of 1.3 in the ER yesterday evening Patient has chronic hypotension He also reports poor appetite  Objective:  Vital signs in last 24 hours:  Temp:  [98.5 F (36.9 C)-101.3 F (38.5 C)] 99.2 F (37.3 C) (09/06 0317) Pulse Rate:  [86-101] 93 (09/06 0317) Resp:  [20-32] 24 (09/06 0323) BP: (78-109)/(25-36) 100/29 (09/06 0317) SpO2:  [93 %-100 %] 97 % (09/06 0317) Weight:  [81.4 kg-85.7 kg] 81.4 kg (09/06 0100)  Weight change:  Filed Weights   09/21/17 1746 09/22/17 0100  Weight: 85.7 kg 81.4 kg    Intake/Output:    Intake/Output Summary (Last 24 hours) at 09/22/2017 1024 Last data filed at 09/21/2017 2200 Gross per 24 hour  Intake 669.96 ml  Output -  Net 669.96 ml     Physical Exam: General:  No acute distress, laying in the bed, chronically ill-appearing  HEENT  moist oral mucous membranes  Neck  supple, no masses  Pulm/lungs  normal breathing effort, nasal cannula oxygen, coarse bilaterally  CVS/Heart  tachycardic, regular  Abdomen:   Soft, nontender  Extremities: Left arm edema  Neurologic:  Alert, oriented, left-sided weakness on chronic edema  Skin:  No acute rashes  Access: Left IJ PC       Basic Metabolic Panel:  Recent Labs  Lab 09/21/17 1754  NA 141  K 3.1*  CL 98  CO2 28  GLUCOSE 117*  BUN 28*  CREATININE 6.03*  CALCIUM 8.1*     CBC: Recent Labs  Lab 09/21/17 1754  WBC 10.0  NEUTROABS 9.2*  HGB 12.0*  HCT 37.4*  MCV 83.4  PLT 253      Lab Results  Component Value Date   HEPBSAG Negative 07/27/2017   HEPBSAB Non Reactive 07/11/2016      Microbiology:  Recent Results (from the past 240 hour(s))  Blood Culture (routine x 2)     Status: None (Preliminary result)   Collection Time: 09/21/17  5:54 PM  Result Value Ref Range Status   Specimen Description   Final    BLOOD  RIGHT HAND Performed at [facility], [address] [place], [place] [address]    Special Requests   Final    BOTTLES DRAWN AEROBIC AND ANAEROBIC Blood Culture results may not be optimal due to an excessive volume of blood received in culture bottles Performed at [facility], [address] [place], [place] [address]    Culture  Setup Time   Final    GRAM POSITIVE COCCI IN BOTH AEROBIC AND ANAEROBIC BOTTLES CRITICAL RESULT CALLED TO, READ BACK BY AND VERIFIED WITH: MATT MCBANE AT 0508 ON 09/22/17 [facility]. Performed at [facility], [address], [place], [place] [address]    Culture GRAM POSITIVE COCCI  Final   Report Status PENDING  Incomplete  Blood Culture ID Panel (Reflexed)     Status: Abnormal   Collection Time: 09/21/17  5:54 PM  Result Value Ref Range Status   Enterococcus species NOT DETECTED NOT DETECTED Final   Listeria monocytogenes NOT DETECTED NOT DETECTED Final   Staphylococcus species DETECTED (A) NOT DETECTED Final    Comment: CRITICAL RESULT CALLED TO, READ BACK BY AND VERIFIED WITH: MATT MCBANE AT 0508 ON 09/22/17 [facility].    Staphylococcus aureus DETECTED (A) NOT DETECTED Final    Comment: Methicillin (oxacillin)-resistant Staphylococcus aureus (MRSA). MRSA is predictably  resistant to beta-lactam antibiotics (except ceftaroline). Preferred therapy is vancomycin unless clinically contraindicated. Patient requires contact precautions if  hospitalized. CRITICAL RESULT CALLED TO, READ BACK BY AND VERIFIED WITH: MATT MCBANE AT 0508 ON 09/22/17 [facility].    Methicillin resistance DETECTED (A) NOT DETECTED Final    Comment: CRITICAL RESULT CALLED TO, READ BACK BY AND VERIFIED WITH: MATT MCBANE AT 0508 ON 09/22/17 [facility].    Streptococcus species NOT DETECTED NOT DETECTED Final   Streptococcus agalactiae NOT DETECTED NOT DETECTED Final   Streptococcus pneumoniae NOT DETECTED NOT DETECTED Final   Streptococcus pyogenes NOT DETECTED NOT DETECTED Final    Acinetobacter baumannii NOT DETECTED NOT DETECTED Final   Enterobacteriaceae species NOT DETECTED NOT DETECTED Final   Enterobacter cloacae complex NOT DETECTED NOT DETECTED Final   Escherichia coli NOT DETECTED NOT DETECTED Final   Klebsiella oxytoca NOT DETECTED NOT DETECTED Final   Klebsiella pneumoniae NOT DETECTED NOT DETECTED Final   Proteus species NOT DETECTED NOT DETECTED Final   Serratia marcescens NOT DETECTED NOT DETECTED Final   Haemophilus influenzae NOT DETECTED NOT DETECTED Final   Neisseria meningitidis NOT DETECTED NOT DETECTED Final   Pseudomonas aeruginosa NOT DETECTED NOT DETECTED Final   Candida albicans NOT DETECTED NOT DETECTED Final   Candida glabrata NOT DETECTED NOT DETECTED Final   Candida krusei NOT DETECTED NOT DETECTED Final   Candida parapsilosis NOT DETECTED NOT DETECTED Final   Candida tropicalis NOT DETECTED NOT DETECTED Final    Comment: Performed at [facility], [address] [place], [place] [address]  Blood Culture (routine x 2)     Status: None (Preliminary result)   Collection Time: 09/21/17  6:05 PM  Result Value Ref Range Status   Specimen Description BLOOD RIGHT AC  Final   Special Requests   Final    BOTTLES DRAWN AEROBIC AND ANAEROBIC Blood Culture adequate volume   Culture  Setup Time   Final    GRAM POSITIVE COCCI IN BOTH AEROBIC AND ANAEROBIC BOTTLES CRITICAL RESULT CALLED TO, READ BACK BY AND VERIFIED WITH: MATT MCBANE AT 0508 ON 09/22/17 [facility].    Culture   Final    GRAM POSITIVE COCCI CRITICAL RESULT CALLED TO, READ BACK BY AND VERIFIED WITH: Performed at [facility], [address] [place], [place] [address]    Report Status PENDING  Incomplete  MRSA PCR Screening     Status: Abnormal   Collection Time: 09/22/17 12:16 AM  Result Value Ref Range Status   MRSA by PCR POSITIVE (A) NEGATIVE Final    Comment:        The GeneXpert MRSA Assay (FDA approved for NASAL specimens only), is one component of  a comprehensive MRSA colonization surveillance program. It is not intended to diagnose MRSA infection nor to guide or monitor treatment for MRSA infections. CRITICAL RESULT CALLED TO, READ BACK BY AND VERIFIED WITH: C/MARCELLA TURNER @0212  09/22/17 [facility] Performed at [facility], [address] [place], [place] [address]     Coagulation Studies: Recent Labs    09/21/17 06/14/04 09/22/17 0805  LABPROT 40.4* 48.2*  INR 4.23* 5.30*    Urinalysis: No results for input(s): COLORURINE, LABSPEC, PHURINE, GLUCOSEU, HGBUR, BILIRUBINUR, KETONESUR, PROTEINUR, UROBILINOGEN, NITRITE, LEUKOCYTESUR in the last 72 hours.  Invalid input(s): APPERANCEUR    Imaging: Dg Chest Portable 1 View  Result Date: 09/21/2017 CLINICAL DATA:  Fever and chronic cough. EXAM: PORTABLE CHEST 1 VIEW COMPARISON:  August 21, 2017 FINDINGS: Dual lead left central venous line  is unchanged. There is pulmonary edema, right greater than left. There are bilateral pleural effusions. No definite focal consolidation is identified. The bony structures are stable. IMPRESSION: Pulmonary edema.  Bilateral pleural effusions. Electronically Signed   By: Sherian Rein M.D.   On: 09/21/2017 18:29     Medications:   . ceFEPime (MAXIPIME) IV    . vancomycin    . vancomycin     . aspirin  81 mg Oral Daily  . Chlorhexidine Gluconate Cloth  6 each Topical Q0600  . cinacalcet  60 mg Oral QHS  . escitalopram  20 mg Oral QHS  . gabapentin  300 mg Oral QHS  . Influenza vac split quadrivalent PF  0.5 mL Intramuscular Tomorrow-1000  . midodrine  15 mg Oral Once  . midodrine  15 mg Oral Q8H  . multivitamin  1 tablet Oral QHS  . mupirocin ointment  1 application Nasal BID  . pantoprazole  20 mg Oral Daily  . senna-docusate  2 tablet Oral BID  . sevelamer carbonate  2.4 g Oral TID WC   acetaminophen **OR** acetaminophen, albuterol, ondansetron **OR** ondansetron (ZOFRAN) IV, polyethylene glycol, senna-docusate,  vancomycin  Assessment/ Plan:  47 y.o. African-American male End-stage renal disease on hemodialysis MWF followed by [employer], chronic back pain, GERD, hypertension, seizure disorder, obstructive sleep apnea, anemia chronic kidney disease, secondary hyperparathyroidism, anxiety   [facility]/ [address] [facility]/ EDW 86 kg/ MWF/CVC  Patient admitted for cough, fever.  1.  End-stage renal disease -We will arrange for hemodialysis Monday Wednesday Friday while in hospital -Orders prepared for today  2.  Fever, cough.  Chest x-ray shows pulmonary edema and bilateral pleural effusions -Blood cultures positive for gram-positive cocci.  ID panel shows MRSA -Differential includes pneumonia versus dialysis catheter infection -Blood cultures during dialysis  3.  Anemia of chronic kidney disease -Hemoglobin 12.0.  Hold Procrit  4.  Secondary hyperparathyroidism -Monitor phosphorus during hospital admission Currently 3.8     LOS: 1 Ashliegh Parekh Thedore Mins 9/6/201910:24 AM  [facility] [place], [place] [phone]  Note: This note was prepared with Dragon dictation. Any transcription errors are unintentional

## 2017-09-22 NOTE — Progress Notes (Signed)
Initial Nutrition Assessment  DOCUMENTATION CODES:   Not applicable  INTERVENTION:   Nepro Shake po BID, each supplement provides 425 kcal and 19 grams protein  MVI daily  Rena-vite daily  Vitamin C 250mg  po BID  Liberalize diet   Check thiamine, B12, folate, iron, TIBC, ferritin, vitamin D, vitamins A, D, E & K, zinc and copper labs in setting of gastric sleeve surgery.   NUTRITION DIAGNOSIS:   Increased nutrient needs related to chronic illness as evidenced by increased estimated needs.  GOAL:   Patient will meet greater than or equal to 90% of their needs  MONITOR:   PO intake, Supplement acceptance, Labs, Weight trends, I & O's, Skin  REASON FOR ASSESSMENT:   Consult Assessment of nutrition requirement/status  ASSESSMENT:   48 y.o. male with a known history of stage renal disease on hemodialysis, h/o gastric sleeve, seizures, sleep apnea on CPAP, stroke with left sided hemiparesis where patient is bedbound and requires total care, comes to the emergency room with fever of 102 shortness of breath and cough. Pt found to have PNA with sepsis    RD unable to see pt today. Pt in HD at time of RD visit. Pt with h/o CKD secondary to HTN, initiated on HD in 2011. Per chart, pt with h/o gastric sleeve in 04/2014 in preparation for possible kidney transplant. Pt weighed 320lbs prior to his surgery in 2016. Per chart, pt has lost 141lbs since having his surgery. Pt has lost 48lbs(21%) over the past year; unsure of pt's dry weight. Pt seen by RD in July and reported at that time that he was not taking any vitamins at home. Pt reported good appetite at that time. Pt at high risk for nutrient deficiencies r/t gastric sleeve surgery and chronic HD. Recommend check thiamine, B12, folate, iron, calcium, zinc, copper, and vitamins D, A, E, & K labs to r/o deficiency. Pt is bed bound at baseline secondary to h/o stroke and L sided hemiplegia. RD will order supplements and vitamins to help pt  meet his estimated needs. RD will obtain nutrition related history and exam at follow up.    Medications reviewed and include: aspirin, cinacalcet, rena-vite, protonix, senokot, renvela, vancomycin   Labs reviewed: K 3.1(L), BUN 28(H), creat 6.03(H), Ca 8.1(L) adj. 9.3 wnl, alb 2.5(L) Hgb 12.0(L), Hct 37.4(L) INR- 5.30(H) iPTH- 132(H)- 7/11  Diet Order:   Diet Order            Diet regular Room service appropriate? Yes; Fluid consistency: Thin; Fluid restriction: 1200 mL Fluid  Diet effective now             EDUCATION NEEDS:   Not appropriate for education at this time  Skin:  Skin Assessment: Reviewed RN Assessment(wound right toe, 2X.2X.1cm )  Last BM:  9/6- type 7  Height:   Ht Readings from Last 1 Encounters:  09/22/17 5\' 6"  (1.676 m)    Weight:   Wt Readings from Last 1 Encounters:  09/22/17 81.4 kg    Ideal Body Weight:  64.5 kg  BMI:  Body mass index is 28.96 kg/m.  Estimated Nutritional Needs:   Kcal:  2100-2400kcal/day   Protein:  98-114g/day   Fluid:  UOP + 1L  Betsey [place] MS, RD, LDN Pager #- [phone] Office#- [phone] After Hours Pager: [phone]

## 2017-09-22 NOTE — Progress Notes (Addendum)
[facility] [place] at [facility]   PATIENT NAME: Perry [name]    MR#:  [id number]  DATE OF BIRTH:  [birth year]  SUBJECTIVE:  CHIEF COMPLAINT:   Chief Complaint  Patient presents with  . Fever  Patient is lethargic, slow to respond on, per nursing staff-concern for ability to swallow, speech therapy to see, placed on aspiration/fall/skin care precautions, ID to see given acute gram-positive cocci bacteremia, MRSA PCR positive  REVIEW OF SYSTEMS:  CONSTITUTIONAL: No fever, fatigue or weakness.  EYES: No blurred or double vision.  EARS, NOSE, AND THROAT: No tinnitus or ear pain.  RESPIRATORY: No cough, shortness of breath, wheezing or hemoptysis.  CARDIOVASCULAR: No chest pain, orthopnea, edema.  GASTROINTESTINAL: No nausea, vomiting, diarrhea or abdominal pain.  GENITOURINARY: No dysuria, hematuria.  ENDOCRINE: No polyuria, nocturia,  HEMATOLOGY: No anemia, easy bruising or bleeding SKIN: No rash or lesion. MUSCULOSKELETAL: No joint pain or arthritis.   NEUROLOGIC: No tingling, numbness, weakness.  PSYCHIATRY: No anxiety or depression.   ROS  DRUG ALLERGIES:   Allergies  Allergen Reactions  . Depakote [Divalproex Sodium] Other (See Comments)    Hallucinations   . Hydromorphone Other (See Comments)    VITALS:  Blood pressure (!) 96/31, pulse 92, temperature 97.9 F (36.6 C), temperature source Oral, resp. rate (!) 24, height 5\' 6"  (1.676 m), weight 81.4 kg, SpO2 97 %.  PHYSICAL EXAMINATION:  GENERAL:  47 y.o.-year-old patient lying in the bed with no acute distress.  EYES: Pupils equal, round, reactive to light and accommodation. No scleral icterus. Extraocular muscles intact.  HEENT: Head atraumatic, normocephalic. Oropharynx and nasopharynx clear.  NECK:  Supple, no jugular venous distention. No thyroid enlargement, no tenderness.  LUNGS: Normal breath sounds bilaterally, no wheezing, rales,rhonchi or crepitation. No use of accessory muscles of  respiration.  CARDIOVASCULAR: S1, S2 normal. No murmurs, rubs, or gallops.  ABDOMEN: Soft, nontender, nondistended. Bowel sounds present. No organomegaly or mass.  EXTREMITIES: No pedal edema, cyanosis, or clubbing.  NEUROLOGIC: Cranial nerves II through XII are intact. Muscle strength 5/5 in all extremities. Sensation intact. Gait not checked.  PSYCHIATRIC: The patient is alert and oriented x 3.  SKIN: No obvious rash, lesion, or ulcer.   Physical Exam LABORATORY PANEL:   CBC Recent Labs  Lab 09/21/17 1754  WBC 10.0  HGB 12.0*  HCT 37.4*  PLT 253   ------------------------------------------------------------------------------------------------------------------  Chemistries  Recent Labs  Lab 09/21/17 1754  NA 141  K 3.1*  CL 98  CO2 28  GLUCOSE 117*  BUN 28*  CREATININE 6.03*  CALCIUM 8.1*  AST 27  ALT 18  ALKPHOS 86  BILITOT 1.4*   ------------------------------------------------------------------------------------------------------------------  Cardiac Enzymes No results for input(s): TROPONINI in the last 168 hours. ------------------------------------------------------------------------------------------------------------------  RADIOLOGY:  Dg Chest Portable 1 View  Result Date: 09/21/2017 CLINICAL DATA:  Fever and chronic cough. EXAM: PORTABLE CHEST 1 VIEW COMPARISON:  August 21, 2017 FINDINGS: Dual lead left central venous line is unchanged. There is pulmonary edema, right greater than left. There are bilateral pleural effusions. No definite focal consolidation is identified. The bony structures are stable. IMPRESSION: Pulmonary edema.  Bilateral pleural effusions. Electronically Signed   By: Sherian Rein M.D.   On: 09/21/2017 18:29    ASSESSMENT AND PLAN:  Keyonta [name]  is a 47 y.o. male with a known history of stage renal disease on hemodialysis, seizures, sleep apnea on CPAP, stroke with left sided hemiparesis where patient is bedbound and requires total  care, comes  to the emergency room with fever of 102 shortness of breath and cough. Patient is not able to cough up any phlegm. He a temperature of 101.3 the emergency room with mild tachycardia. Blood pressure was based on the lower side received his middle drain. Chest x-ray shows bilateral pulmonary edema. Sats are 96% on 2 L. Patient is being admitted with febrile illness rule out pneumonia.  *Acute gram-positive cocci bacteremia Sepsis protocol, empiric vancomycin, infectious disease consult if expert opinion, follow-up on cultures, repeat blood cultures  * Acute encephalopathy Suspect secondary to above Noted history of CVA with left hemiparesis Increased nursing care PRN, aspiration/fall/skin care precautions while in house, speech therapy to evaluate/treat, check ammonia level, RPR, neurochecks per routine, discontinue Neurontin, continue close medical monitoring  *Chronic  ESRD on HD MWF Nephrology consulted for hemodialysis needs   *History of  CVA with left hemiparesis/functional quadriplegia Plan of care as stated above Long-term prognosis is poor  *Chronic orthostatic hypotension  Continue Midodrine  *Chornic anticoagulation  ?reason for cva, per mother to prevent HD cath clotting  *Acute Coumadin toxicity Hold Coumadin, check INR every a.m., pharmacy to dose    All the records are reviewed and case discussed with Care Management/Social Workerr. Management plans discussed with the patient, family and they are in agreement.  CODE STATUS: dnr  TOTAL TIME TAKING CARE OF THIS PATIENT: 40 minutes.     POSSIBLE D/C IN 2-4 DAYS, DEPENDING ON CLINICAL CONDITION.   [name] M.D on 09/22/2017   Between 7am to 6pm - Pager - [phone]  After 6pm go to [email or web address] - Social research officer, government  [facility]  Office  [phone]  CC: Primary care physician; Center, [facility]  Note: This dictation was prepared with Dragon dictation  along with smaller phrase technology. Any transcriptional errors that result from this process are unintentional.

## 2017-09-22 NOTE — Progress Notes (Signed)
Spoke with Dr.Salary regarding critical INR result of 5.3. No new orders at this time. Perry [name]

## 2017-09-22 NOTE — Progress Notes (Signed)
Pharmacy Antibiotic Note  Perry [name] is a 47 y.o. male admitted on 09/21/2017 with pneumonia/sepsis  Pharmacy has been consulted for vancomycin and cefepime dosing. PMH includes stroke and permanent left-sided disability, end-stage renal disease on HD Monday Wednesday Friday (received HD 9/4), chronic hypotension on midodrine. 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   Plan: Vancomycin loading dose of 1500mg  to target a peak of 25 mcg/mL. Maintenance dose will be 1000mg  after every HD session. Goal pre-HD vancomycin level is 15-25 mcg/mL with level to be drawn prior to every 3rd HD session  Cefepime: 2 g on day 1 (2 grams already given in ED) followed by 1 mg every 24 hours  Height: 5\' 6"  (167.6 cm) Weight: 179 lb 7.3 oz (81.4 kg) IBW/kg (Calculated) : 63.8  Temp (24hrs), Avg:99.5 F (37.5 C), Min:98.5 F (36.9 C), Max:101.3 F (38.5 C)  Recent Labs  Lab 09/21/17 1754  WBC 10.0  CREATININE 6.03*  LATICACIDVEN 1.5    Estimated Creatinine Clearance: 15.3 mL/min (A) (by C-G formula based on SCr of 6.03 mg/dL (H)).    Allergies  Allergen Reactions  . Depakote [Divalproex Sodium] Other (See Comments)    Hallucinations   . Hydromorphone Other (See Comments)    Antimicrobials this admission: cefepime 9/5 >>  vancomycin 9/5 >>   Microbiology results: 9/5 BCx: pending 9/5 UCx: pending  9/5 MRSA PCR: positive  Thank you for allowing pharmacy to be a part of this patient's care.  Carola Frost, PharmD 09/22/2017 7:48 AM

## 2017-09-22 NOTE — Progress Notes (Signed)
SLP Cancellation Note  Patient currently undergoing hemodialysis and is not available for assessment.  The patient had an MBS 07/31/2017 with aspiration of thin liquids.  The recommended diet was dysphagia 3 with nectar-thick liquids.  Will change diet to dysphagia 3 with nectar-thick diet and SLP will follow up tomorrow with bedside clinical swallow evaluation.  Patient Details Name: Perry [name] MRN: [id number] DOB: 10/16/1970   Cancelled treatment:           Leandrew Koyanagi 09/22/2017, 3:00 PM

## 2017-09-22 NOTE — Consult Note (Addendum)
WOC Nurse wound consult note Reason for Consult: Consult requested for right toe. Wound type: Dry scabbed callous to right anterior 2nd toe removes easily when cleansed, revealing pink dry .2X.2X.1cm partial thickness wound.  No odor, drainage, or fluctuance. Dressing procedure/placement/frequency: Bandaid to protect from further injury. Please re-consult if further assistance is needed.  Thank-you,  Cammie Mcgee MSN, RN, CWOCN, [place], CNS [phone]
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
## 2017-09-22 NOTE — Progress Notes (Signed)
Hemodialysis treatment started. 
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
## 2017-09-22 NOTE — Care Management (Signed)
Patient admitted from home with shortness of breath and cough rule out pneumonia.  Patient lives at home with mother.  Hx of CVA.  Dimas Chyle dialysis liaison notified of admission.  Clydie Braun with [facility] notified of admission patient has home visit for outpatient palliative on 9/24.  Elnita Maxwell with [facility] notified of admission. Patient is open with RN, Speech and OT. Patient has a hospital bed, lift, and WC in the home.  RNCM spoke with mother via phone.  Per Mrs Toni Arthurs Patient has been getting to HD via [facility] medical Transportation.  Mrs Toni Arthurs confirms that a ramp was built on the house.  Family and friends provide 24/7 care for the patient in the home.  Will need resumption orders at discharge.  RNCM following

## 2017-09-22 NOTE — Progress Notes (Signed)
Family Meeting Note  Advance Directive:yes  Today a meeting took place with the Patient/mother.  Patient is unable to participate due BX:[id number] capacity lethargy   The following clinical team members were present during this meeting:MD  The following were discussed:Patient's diagnosis: End-stage renal disease, encephalopathy, bacteremia, orthostatic hypotension, seizure disorder, obstructive sleep apnea, Patient's progosis: Undetermined and Goals for treatment: DNR  Additional follow-up to be provided: prn  Time spent during discussion:20 minutes  Bertrum Sol, MD

## 2017-09-23 LAB — ZINC: ZINC: 51 ug/dL — AB (ref 56–134)

## 2017-09-23 LAB — PROTIME-INR
INR: 6.67
Prothrombin Time: 57.7 seconds — ABNORMAL HIGH (ref 11.4–15.2)

## 2017-09-23 LAB — HEPATITIS B SURFACE ANTIBODY, QUANTITATIVE: Hepatitis B-Post: 6.7 m[IU]/mL — ABNORMAL LOW (ref >9.9)

## 2017-09-23 LAB — COPPER, SERUM: COPPER: 71 ug/dL — AB (ref 72–166)

## 2017-09-23 LAB — VITAMIN D 25 HYDROXY (VIT D DEFICIENCY, FRACTURES): VIT D 25 HYDROXY: 10.6 ng/mL — AB (ref 30.0–100.0)

## 2017-09-23 LAB — HEPATITIS B SURFACE ANTIGEN: Hepatitis B Surface Ag: NEGATIVE

## 2017-09-23 LAB — RPR: RPR Ser Ql: NONREACTIVE

## 2017-09-23 MED ORDER — SODIUM CHLORIDE 0.9 % IV BOLUS
250.0000 mL | Freq: Once | INTRAVENOUS | Status: AC
  Administered 2017-09-23: 250 mL via INTRAVENOUS

## 2017-09-23 MED ORDER — VITAMIN D (ERGOCALCIFEROL) 1.25 MG (50000 UNIT) PO CAPS
50000.0000 [IU] | ORAL_CAPSULE | ORAL | Status: DC
  Administered 2017-09-23: 50000 [IU] via ORAL
  Filled 2017-09-23: qty 1

## 2017-09-23 MED ORDER — ALPRAZOLAM 1 MG PO TABS: 2.0000 mg | ORAL_TABLET | Freq: Every evening | ORAL | Status: DC | PRN

## 2017-09-23 MED ORDER — ORAL CARE MOUTH RINSE
15.0000 mL | Freq: Two times a day (BID) | OROMUCOSAL | Status: DC
  Administered 2017-09-23 – 2017-09-26 (×7): 15 mL via OROMUCOSAL

## 2017-09-23 MED ORDER — WARFARIN - PHARMACIST DOSING INPATIENT: Freq: Every day | Status: DC
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
## 2017-09-23 NOTE — Progress Notes (Signed)
[facility], [place] 09/23/17  Subjective:   Patient presents this time for complains of cough, fever, tachycardia Afebrile now Patient has chronic hypotension He also reports poor appetite Underwent HD yesterday. No problems reported Blood cultures positive  Objective:  Vital signs in last 24 hours:  Temp:  [97.4 F (36.3 C)-98.6 F (37 C)] 97.5 F (36.4 C) (09/07 0512) Pulse Rate:  [75-92] 87 (09/07 0649) Resp:  [9-34] 21 (09/07 0512) BP: (69-112)/(22-45) 99/30 (09/07 0649) SpO2:  [94 %-100 %] 100 % (09/07 0512) Weight:  [81.4 kg] 81.4 kg (09/06 1215)  Weight change: -4.33 kg Filed Weights   09/21/17 1746 09/22/17 0100 09/22/17 1215  Weight: 85.7 kg 81.4 kg 81.4 kg    Intake/Output:    Intake/Output Summary (Last 24 hours) at 09/23/2017 1041 Last data filed at 09/23/2017 0400 Gross per 24 hour  Intake 679 ml  Output -250 ml  Net 929 ml     Physical Exam: General:  No acute distress, laying in the bed, chronically ill-appearing  HEENT  moist oral mucous membranes  Neck  supple, no masses  Pulm/lungs  normal breathing effort, nasal cannula oxygen, coarse bilaterally  CVS/Heart  tachycardic, regular  Abdomen:   Soft, nontender  Extremities: Left arm edema  Neurologic:  Alert, oriented, left-sided weakness  Skin:  No acute rashes  Access: Left IJ PC       Basic Metabolic Panel:  Recent Labs  Lab 09/21/17 1754 09/22/17 1251  NA 141  --   K 3.1*  --   CL 98  --   CO2 28  --   GLUCOSE 117*  --   BUN 28*  --   CREATININE 6.03*  --   CALCIUM 8.1*  --   PHOS  --  3.8     CBC: Recent Labs  Lab 09/21/17 1754  WBC 10.0  NEUTROABS 9.2*  HGB 12.0*  HCT 37.4*  MCV 83.4  PLT 253      Lab Results  Component Value Date   HEPBSAG Negative 09/22/2017   HEPBSAB Non Reactive 07/11/2016      Microbiology:  Recent Results (from the past 240 hour(s))  Blood Culture (routine x 2)     Status: Abnormal (Preliminary result)    Collection Time: 09/21/17  5:54 PM  Result Value Ref Range Status   Specimen Description   Final    BLOOD RIGHT HAND Performed at [facility], [address] [place], [place] [address]    Special Requests   Final    BOTTLES DRAWN AEROBIC AND ANAEROBIC Blood Culture results may not be optimal due to an excessive volume of blood received in culture bottles Performed at [facility], [address] [place], [place] [address]    Culture  Setup Time   Final    GRAM POSITIVE COCCI IN BOTH AEROBIC AND ANAEROBIC BOTTLES CRITICAL RESULT CALLED TO, READ BACK BY AND VERIFIED WITH: MATT MCBANE AT 0508 ON 09/22/17 [facility].    Culture (A)  Final    STAPHYLOCOCCUS AUREUS SUSCEPTIBILITIES TO FOLLOW Performed at [facility], [address] [place], [place] [address]    Report Status PENDING  Incomplete  Blood Culture ID Panel (Reflexed)     Status: Abnormal   Collection Time: 09/21/17  5:54 PM  Result Value Ref Range Status   Enterococcus species NOT DETECTED NOT DETECTED Final   Listeria monocytogenes NOT DETECTED NOT DETECTED Final   Staphylococcus species DETECTED (A) NOT DETECTED  Final    Comment: CRITICAL RESULT CALLED TO, READ BACK BY AND VERIFIED WITH: MATT MCBANE AT 0508 ON 09/22/17 [facility].    Staphylococcus aureus DETECTED (A) NOT DETECTED Final    Comment: Methicillin (oxacillin)-resistant Staphylococcus aureus (MRSA). MRSA is predictably resistant to beta-lactam antibiotics (except ceftaroline). Preferred therapy is vancomycin unless clinically contraindicated. Patient requires contact precautions if  hospitalized. CRITICAL RESULT CALLED TO, READ BACK BY AND VERIFIED WITH: MATT MCBANE AT 0508 ON 09/22/17 [facility].    Methicillin resistance DETECTED (A) NOT DETECTED Final    Comment: CRITICAL RESULT CALLED TO, READ BACK BY AND VERIFIED WITH: MATT MCBANE AT 0508 ON 09/22/17 [facility].    Streptococcus species NOT DETECTED NOT DETECTED Final   Streptococcus agalactiae NOT  DETECTED NOT DETECTED Final   Streptococcus pneumoniae NOT DETECTED NOT DETECTED Final   Streptococcus pyogenes NOT DETECTED NOT DETECTED Final   Acinetobacter baumannii NOT DETECTED NOT DETECTED Final   Enterobacteriaceae species NOT DETECTED NOT DETECTED Final   Enterobacter cloacae complex NOT DETECTED NOT DETECTED Final   Escherichia coli NOT DETECTED NOT DETECTED Final   Klebsiella oxytoca NOT DETECTED NOT DETECTED Final   Klebsiella pneumoniae NOT DETECTED NOT DETECTED Final   Proteus species NOT DETECTED NOT DETECTED Final   Serratia marcescens NOT DETECTED NOT DETECTED Final   Haemophilus influenzae NOT DETECTED NOT DETECTED Final   Neisseria meningitidis NOT DETECTED NOT DETECTED Final   Pseudomonas aeruginosa NOT DETECTED NOT DETECTED Final   Candida albicans NOT DETECTED NOT DETECTED Final   Candida glabrata NOT DETECTED NOT DETECTED Final   Candida krusei NOT DETECTED NOT DETECTED Final   Candida parapsilosis NOT DETECTED NOT DETECTED Final   Candida tropicalis NOT DETECTED NOT DETECTED Final    Comment: Performed at [facility], [address] [place], [place] [address]  Blood Culture (routine x 2)     Status: Abnormal (Preliminary result)   Collection Time: 09/21/17  6:05 PM  Result Value Ref Range Status   Specimen Description   Final    BLOOD RIGHT [facility] Performed at [facility], [address], [place], [place] [address]    Special Requests   Final    BOTTLES DRAWN AEROBIC AND ANAEROBIC Blood Culture adequate volume Performed at [facility], [address] [place], [place] [address]    Culture  Setup Time   Final    GRAM POSITIVE COCCI IN BOTH AEROBIC AND ANAEROBIC BOTTLES CRITICAL RESULT CALLED TO, READ BACK BY AND VERIFIED WITH: MATT MCBANE AT 0508 ON 09/22/17 [facility]. Performed at [facility], [address] [place], [place] [address]    Culture STAPHYLOCOCCUS AUREUS (A)  Final   Report Status PENDING  Incomplete  MRSA  PCR Screening     Status: Abnormal   Collection Time: 09/22/17 12:16 AM  Result Value Ref Range Status   MRSA by PCR POSITIVE (A) NEGATIVE Final    Comment:        The GeneXpert MRSA Assay (FDA approved for NASAL specimens only), is one component of a comprehensive MRSA colonization surveillance program. It is not intended to diagnose MRSA infection nor to guide or monitor treatment for MRSA infections. CRITICAL RESULT CALLED TO, READ BACK BY AND VERIFIED WITH: C/MARCELLA TURNER @0212  09/22/17 [facility] Performed at [facility], [address] [place], [place] [address]   CULTURE, BLOOD (ROUTINE X 2) w Reflex to ID Panel     Status: None (Preliminary result)   Collection Time: 09/22/17  9:03 AM  Result Value Ref Range Status   Specimen Description BLOOD RIGHT ANTECUBITAL  Final   Special Requests   Final    BOTTLES DRAWN AEROBIC AND ANAEROBIC Blood Culture adequate volume   Culture   Final    NO GROWTH < 24 HOURS Performed at [facility], [address], [place], [place] [address]    Report Status PENDING  Incomplete  CULTURE, BLOOD (ROUTINE X 2) w Reflex to ID Panel     Status: None (Preliminary result)   Collection Time: 09/22/17  9:03 AM  Result Value Ref Range Status   Specimen Description BLOOD BLOOD LEFT HAND  Final   Special Requests   Final    BOTTLES DRAWN AEROBIC AND ANAEROBIC Blood Culture adequate volume   Culture   Final    NO GROWTH < 24 HOURS Performed at [facility], [address], [place], [place] [address]    Report Status PENDING  Incomplete    Coagulation Studies: Recent Labs    09/21/17 2004-05-16 09/22/17 0805 09/23/17 0551  LABPROT 40.4* 48.2* 57.7*  INR 4.23* 5.30* 6.67*    Urinalysis: No results for input(s): COLORURINE, LABSPEC, PHURINE, GLUCOSEU, HGBUR, BILIRUBINUR, KETONESUR, PROTEINUR, UROBILINOGEN, NITRITE, LEUKOCYTESUR in the last 72 hours.  Invalid input(s): APPERANCEUR    Imaging: Dg Chest Portable 1  View  Result Date: 09/21/2017 CLINICAL DATA:  Fever and chronic cough. EXAM: PORTABLE CHEST 1 VIEW COMPARISON:  August 21, 2017 FINDINGS: Dual lead left central venous line is unchanged. There is pulmonary edema, right greater than left. There are bilateral pleural effusions. No definite focal consolidation is identified. The bony structures are stable. IMPRESSION: Pulmonary edema.  Bilateral pleural effusions. Electronically Signed   By: Sherian Rein M.D.   On: 09/21/2017 18:29     Medications:   . vancomycin Stopped (09/22/17 1646)   . aspirin  81 mg Oral Daily  . Chlorhexidine Gluconate Cloth  6 each Topical Q0600  . cinacalcet  60 mg Oral Q supper  . escitalopram  20 mg Oral QHS  . feeding supplement (NEPRO CARB STEADY)  237 mL Oral BID BM  . Influenza vac split quadrivalent PF  0.5 mL Intramuscular Tomorrow-1000  . midodrine  15 mg Oral TID AC  . multivitamin  1 tablet Oral QHS  . multivitamin with minerals  1 tablet Oral Daily  . mupirocin ointment  1 application Nasal BID  . pantoprazole  40 mg Oral QAC breakfast  . senna-docusate  2 tablet Oral BID  . sevelamer carbonate  2.4 g Oral TID WC  . vitamin C  250 mg Oral BID  . Vitamin D (Ergocalciferol)  50,000 Units Oral Q7 days  . [START ON 09/24/2017] Warfarin - Pharmacist Dosing Inpatient   Does not apply q1800   acetaminophen **OR** acetaminophen, albuterol, ondansetron **OR** ondansetron (ZOFRAN) IV, polyethylene glycol, senna-docusate  Assessment/ Plan:  47 y.o. African-American male End-stage renal disease on hemodialysis MWF followed by [employer], chronic back pain, GERD, hypertension, seizure disorder, obstructive sleep apnea, anemia chronic kidney disease, secondary hyperparathyroidism, anxiety   [facility]/ [address] [facility]/ EDW 86 kg/ MWF/CVC  Patient admitted for cough, fever.  1.  End-stage renal disease -We will arrange for hemodialysis Monday 05-17-22 Friday while in hospital  2.  Fever, cough.  Chest x-ray  shows pulmonary edema and bilateral pleural effusions -Blood cultures positive for gram-positive cocci.  ID panel shows MRSA -Differential includes pneumonia versus dialysis catheter infection - ID recommends cathter removal. No vascular surgeon on call.  May try VIR - currently getting iv vancomycin  3.  Anemia of chronic kidney disease -Hemoglobin 12.0.  Hold Procrit - iron deficiency. Avoid iv iron because of ongoing infection  4.  Secondary hyperparathyroidism -Monitor phosphorus during hospital admission Currently 3.8 - hold cinacalcet, sevelamer as poor PO intake  5. Replace Vit D  6. Coagulopathy     LOS: 2 Arlow Spiers Thedore Mins 9/7/201910:41 AM  [facility] [place], [place] [phone]  Note: This note was prepared with Dragon dictation. Any transcription errors are unintentional
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
## 2017-09-23 NOTE — Progress Notes (Signed)
[employer] - [place] at [facility]   PATIENT NAME: Perry [name]    MR#:  [id number]  DATE OF BIRTH:  06-18-1970  SUBJECTIVE:  CHIEF COMPLAINT:   Chief Complaint  Patient presents with  . Fever  Patient is lethargic and slow to respond and answer questions.  REVIEW OF SYSTEMS:  CONSTITUTIONAL: No fever, fatigue or weakness.  EYES: No blurred or double vision.  EARS, NOSE, AND THROAT: No tinnitus or ear pain.  RESPIRATORY: No cough, shortness of breath, wheezing or hemoptysis.  CARDIOVASCULAR: No chest pain, orthopnea, edema.  GASTROINTESTINAL: No nausea, vomiting, diarrhea or abdominal pain.  GENITOURINARY: No dysuria, hematuria.  ENDOCRINE: No polyuria, nocturia,  HEMATOLOGY: No anemia, easy bruising or bleeding SKIN: No rash or lesion. MUSCULOSKELETAL: No joint pain or arthritis.   NEUROLOGIC: No tingling, numbness, weakness.  PSYCHIATRY: No anxiety or depression.   ROS  DRUG ALLERGIES:   Allergies  Allergen Reactions  . Depakote [Divalproex Sodium] Other (See Comments)    Hallucinations   . Hydromorphone Other (See Comments)    VITALS:  Blood pressure (!) 92/27, pulse 93, temperature (!) 97.3 F (36.3 C), temperature source Oral, resp. rate (!) 21, height 5\' 6"  (1.676 m), weight 81.4 kg, SpO2 100 %.  PHYSICAL EXAMINATION:  GENERAL:  47 y.o.-year-old patient lying in the bed with no acute distress.  EYES: Pupils equal, round, reactive to light and accommodation. No scleral icterus. Extraocular muscles intact.  HEENT: Head atraumatic, normocephalic. Oropharynx and nasopharynx clear.  NECK:  Supple, no jugular venous distention. No thyroid enlargement, no tenderness.  LUNGS: Normal breath sounds bilaterally, no wheezing, rales,rhonchi or crepitation. No use of accessory muscles of respiration.  CARDIOVASCULAR: S1, S2 normal. No murmurs, rubs, or gallops.  ABDOMEN: Soft, nontender, nondistended. Bowel sounds present. No organomegaly or mass.   EXTREMITIES: No pedal edema, cyanosis, or clubbing.  NEUROLOGIC: Cranial nerves II through XII are intact. Muscle strength 3-4/5 in all extremities.  Left-sided weakness.  Sensation intact. Gait not checked.  PSYCHIATRIC: The patient is alert and oriented x 3.  SKIN: No obvious rash, lesion, or ulcer.   Physical Exam LABORATORY PANEL:   CBC Recent Labs  Lab 09/21/17 1754  WBC 10.0  HGB 12.0*  HCT 37.4*  PLT 253   ------------------------------------------------------------------------------------------------------------------  Chemistries  Recent Labs  Lab 09/21/17 1754  NA 141  K 3.1*  CL 98  CO2 28  GLUCOSE 117*  BUN 28*  CREATININE 6.03*  CALCIUM 8.1*  AST 27  ALT 18  ALKPHOS 86  BILITOT 1.4*   ------------------------------------------------------------------------------------------------------------------  Cardiac Enzymes No results for input(s): TROPONINI in the last 168 hours. ------------------------------------------------------------------------------------------------------------------  RADIOLOGY:  Dg Chest Portable 1 View  Result Date: 09/21/2017 CLINICAL DATA:  Fever and chronic cough. EXAM: PORTABLE CHEST 1 VIEW COMPARISON:  August 21, 2017 FINDINGS: Dual lead left central venous line is unchanged. There is pulmonary edema, right greater than left. There are bilateral pleural effusions. No definite focal consolidation is identified. The bony structures are stable. IMPRESSION: Pulmonary edema.  Bilateral pleural effusions. Electronically Signed   By: Sherian Rein M.D.   On: 09/21/2017 18:29    ASSESSMENT AND PLAN:  Briggston Stiggers  is a 47 y.o. male with a known history of stage renal disease on hemodialysis, seizures, sleep apnea on CPAP, stroke with left sided hemiparesis where patient is bedbound and requires total care, comes to the emergency room with fever of 102 shortness of breath and cough. Patient is not able to cough up  any phlegm. He a  temperature of 101.3 the emergency room with mild tachycardia. Blood pressure was based on the lower side received his middle drain. Chest x-ray shows bilateral pulmonary edema. Sats are 96% on 2 L. Patient is being admitted with febrile illness rule out pneumonia.  *Acute MRSA bacteremia Continue vancomycin, need to remove the catheter. He will need 2D echo/TEE per Dr. Joylene Draft.   Follow-up on cultures, repeat blood cultures  * Acute metabolic encephalopathy Suspect secondary to above Noted history of CVA with left hemiparesis Increased nursing care PRN, aspiration/fall/skin care precautions while in house, speech therapy to evaluate/treat, check ammonia level, RPR, neurochecks per routine, discontinued Neurontin, continue close medical monitoring  *Chronic  ESRD on HD MWF Dialysis catheter removal per Dr. Thedore Mins.  *History of  CVA with left hemiparesis/functional quadriplegia Plan of care as stated above Long-term prognosis is poor  *Chronic orthostatic hypotension  Continue Midodrine  *Chornic anticoagulation  ?reason for cva, per mother to prevent HD cath clotting  *Acute Coumadin toxicity, INR is elevated up to 6.67, no active bleeding. Hold Coumadin, check INR every a.m., pharmacy to dose  Hypokalemia.  Adjust during hemodialysis.  Follow-up BMP.  Discussed with Dr. Thedore Mins. All the records are reviewed and case discussed with Care Management/Social Workerr. Management plans discussed with the patient, family and they are in agreement.  CODE STATUS: dnr  TOTAL TIME TAKING CARE OF THIS PATIENT: 40 minutes.  POSSIBLE D/C IN >3 DAYS, DEPENDING ON CLINICAL CONDITION.   Shaune Pollack M.D on 09/23/2017   Between 7am to 6pm - Pager - [phone]  After 6pm go to [email or web address] - Social research officer, government  [facility] [place] [facility]  Office  [phone]  CC: Primary care physician; Center, [facility]  Note: This dictation was prepared with Dragon dictation along  with smaller phrase technology. Any transcriptional errors that result from this process are unintentional.

## 2017-09-24 ENCOUNTER — Inpatient Hospital Stay: Admit: 2017-09-24 | Payer: Medicare Other

## 2017-09-24 LAB — VITAMIN A: Vitamin A (Retinoic Acid): 6.6 ug/dL — ABNORMAL LOW (ref 20.1–62.0)

## 2017-09-24 LAB — CULTURE, BLOOD (ROUTINE X 2): SPECIAL REQUESTS: ADEQUATE

## 2017-09-24 LAB — VITAMIN B1: VITAMIN B1 (THIAMINE): 107.6 nmol/L (ref 66.5–200.0)

## 2017-09-24 LAB — PROTIME-INR
INR: 6.57
Prothrombin Time: 57 seconds — ABNORMAL HIGH (ref 11.4–15.2)

## 2017-09-24 LAB — VITAMIN E
VITAMIN E (ALPHA TOCOPHEROL): 15.8 mg/L (ref 7.0–25.1)
Vitamin E(Gamma Tocopherol): 1.8 mg/L (ref 0.5–5.5)

## 2017-09-24 MED ORDER — MORPHINE SULFATE (PF) 2 MG/ML IV SOLN
1.0000 mg | Freq: Four times a day (QID) | INTRAVENOUS | Status: DC | PRN
  Administered 2017-09-24: 1 mg via INTRAVENOUS
  Filled 2017-09-24: qty 1

## 2017-09-24 MED ORDER — OCUVITE-LUTEIN PO CAPS
1.0000 | ORAL_CAPSULE | Freq: Every day | ORAL | Status: DC
  Administered 2017-09-25 – 2017-09-26 (×2): 1 via ORAL
  Filled 2017-09-24 (×4): qty 1

## 2017-09-24 MED ORDER — MORPHINE SULFATE (PF) 2 MG/ML IV SOLN: 2.0000 mg | Freq: Four times a day (QID) | INTRAVENOUS | Status: DC | PRN

## 2017-09-24 NOTE — Progress Notes (Signed)
Dr. Imogene Burn is aware of INR results of 6.57.
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
## 2017-09-24 NOTE — Progress Notes (Signed)
[employer] - [place] at [facility]   PATIENT NAME: Perry [name]    MR#:  [id number]  DATE OF BIRTH:  Feb 05, 1970  SUBJECTIVE:  CHIEF COMPLAINT:   Chief Complaint  Patient presents with  . Fever  Patient is more awake and slow to respond and answer questions. REVIEW OF SYSTEMS:  CONSTITUTIONAL: No fever, has generalized weakness.  EYES: No blurred or double vision.  EARS, NOSE, AND THROAT: No tinnitus or ear pain.  RESPIRATORY: No cough, shortness of breath, wheezing or hemoptysis.  CARDIOVASCULAR: No chest pain, orthopnea, edema.  GASTROINTESTINAL: No nausea, vomiting, diarrhea or abdominal pain.  GENITOURINARY: No dysuria, hematuria.  ENDOCRINE: No polyuria, nocturia,  HEMATOLOGY: No anemia, easy bruising or bleeding SKIN: No rash or lesion. MUSCULOSKELETAL: No joint pain or arthritis.   NEUROLOGIC: No tingling, numbness, weakness.  PSYCHIATRY: No anxiety or depression.   ROS  DRUG ALLERGIES:   Allergies  Allergen Reactions  . Depakote [Divalproex Sodium] Other (See Comments)    Hallucinations   . Hydromorphone Other (See Comments)    VITALS:  Blood pressure (!) 105/33, pulse 90, temperature 98 F (36.7 C), temperature source Oral, resp. rate 18, height 5\' 6"  (1.676 m), weight 81.4 kg, SpO2 96 %.  PHYSICAL EXAMINATION:  GENERAL:  47 y.o.-year-old patient lying in the bed with no acute distress.  EYES: Pupils equal, round, reactive to light and accommodation. No scleral icterus. Extraocular muscles intact.  HEENT: Head atraumatic, normocephalic. Oropharynx and nasopharynx clear.  NECK:  Supple, no jugular venous distention. No thyroid enlargement, no tenderness.  LUNGS: Normal breath sounds bilaterally, no wheezing, rales,rhonchi or crepitation. No use of accessory muscles of respiration.  CARDIOVASCULAR: S1, S2 normal. No murmurs, rubs, or gallops.  ABDOMEN: Soft, nontender, nondistended. Bowel sounds present. No organomegaly or mass.   EXTREMITIES: No pedal edema, cyanosis, or clubbing.  NEUROLOGIC: Cranial nerves II through XII are intact. Muscle strength 3-4/5 in all extremities.  Left-sided weakness.  Sensation intact. Gait not checked.  PSYCHIATRIC: The patient is alert and oriented x 3.  SKIN: No obvious rash, lesion, or ulcer.   Physical Exam LABORATORY PANEL:   CBC Recent Labs  Lab 09/21/17 1754  WBC 10.0  HGB 12.0*  HCT 37.4*  PLT 253   ------------------------------------------------------------------------------------------------------------------  Chemistries  Recent Labs  Lab 09/21/17 1754  NA 141  K 3.1*  CL 98  CO2 28  GLUCOSE 117*  BUN 28*  CREATININE 6.03*  CALCIUM 8.1*  AST 27  ALT 18  ALKPHOS 86  BILITOT 1.4*   ------------------------------------------------------------------------------------------------------------------  Cardiac Enzymes No results for input(s): TROPONINI in the last 168 hours. ------------------------------------------------------------------------------------------------------------------  RADIOLOGY:  No results found.  ASSESSMENT AND PLAN:  Jw Velasco  is a 47 y.o. male with a known history of stage renal disease on hemodialysis, seizures, sleep apnea on CPAP, stroke with left sided hemiparesis where patient is bedbound and requires total care, comes to the emergency room with fever of 102 shortness of breath and cough. Patient is not able to cough up any phlegm. He a temperature of 101.3 the emergency room with mild tachycardia. Blood pressure was based on the lower side received his middle drain. Chest x-ray shows bilateral pulmonary edema. Sats are 96% on 2 L. Patient is being admitted with febrile illness rule out pneumonia.  *Acute MRSA bacteremia Continue vancomycin, need to remove the catheter. He will need 2D echo/TEE per Dr. Joylene Draft.   Follow-up on cultures, repeat blood cultures  * Acute metabolic encephalopathy Suspect secondary  to  above Noted history of CVA with left hemiparesis Discontinued Neurontin. Mental status is improving.  *Chronic  ESRD on HD MWF Dialysis catheter removal per Dr. Thedore Mins.  *History of  CVA with left hemiparesis/functional quadriplegia Plan of care as stated above Long-term prognosis is poor  *Chronic orthostatic hypotension  Continue Midodrine  *Chornic anticoagulation  Hold Coumadin due to supratherapeutic INR.  *Acute Coumadin toxicity, INR is elevated up to 6.57, no active bleeding. Hold Coumadin, check INR every a.m., pharmacy to dose  Hypokalemia.  Adjust during hemodialysis.  Follow-up BMP.  All the records are reviewed and case discussed with Care Management/Social Workerr. Management plans discussed with the patient, family and they are in agreement.  CODE STATUS: dnr  TOTAL TIME TAKING CARE OF THIS PATIENT: 36 minutes.  POSSIBLE D/C IN >3 DAYS, DEPENDING ON CLINICAL CONDITION.   Shaune Pollack M.D on 09/24/2017   Between 7am to 6pm - Pager - [phone]  After 6pm go to [email or web address] - Social research officer, government  [facility] [place] [facility]  Office  [phone]  CC: Primary care physician; Center, [facility]  Note: This dictation was prepared with Dragon dictation along with smaller phrase technology. Any transcriptional errors that result from this process are unintentional.

## 2017-09-24 NOTE — Progress Notes (Signed)
[facility], [place] 09/24/17  Subjective:   Patient presents this time for complains of cough, fever, tachycardia Afebrile now Patient has chronic hypotension He also reports poor appetite Blood cultures + for MRSA  Objective:  Vital signs in last 24 hours:  Temp:  [97.3 F (36.3 C)-98.6 F (37 C)] 98 F (36.7 C) (09/08 0616) Pulse Rate:  [84-97] 90 (09/08 0616) Resp:  [18-26] 18 (09/08 0616) BP: (92-105)/(27-77) 105/40 (09/08 0616) SpO2:  [94 %-100 %] 96 % (09/08 0616)  Weight change:  Filed Weights   09/21/17 1746 09/22/17 0100 09/22/17 1215  Weight: 85.7 kg 81.4 kg 81.4 kg    Intake/Output:    Intake/Output Summary (Last 24 hours) at 09/24/2017 1112 Last data filed at 09/24/2017 0758 Gross per 24 hour  Intake 0 ml  Output 0 ml  Net 0 ml     Physical Exam: General:  No acute distress, laying in the bed, chronically ill-appearing  HEENT  moist oral mucous membranes  Neck  supple, no masses  Pulm/lungs  normal breathing effort, nasal cannula oxygen, coarse bilaterally  CVS/Heart  tachycardic, regular  Abdomen:   Soft, nontender  Extremities: Left arm edema  Neurologic:  Alert, oriented, left-sided weakness  Skin:  No acute rashes  Access: Left IJ PC       Basic Metabolic Panel:  Recent Labs  Lab 09/21/17 1754 09/22/17 1251  NA 141  --   K 3.1*  --   CL 98  --   CO2 28  --   GLUCOSE 117*  --   BUN 28*  --   CREATININE 6.03*  --   CALCIUM 8.1*  --   PHOS  --  3.8     CBC: Recent Labs  Lab 09/21/17 1754  WBC 10.0  NEUTROABS 9.2*  HGB 12.0*  HCT 37.4*  MCV 83.4  PLT 253      Lab Results  Component Value Date   HEPBSAG Negative 09/22/2017   HEPBSAB Non Reactive 07/11/2016      Microbiology:  Recent Results (from the past 240 hour(s))  Blood Culture (routine x 2)     Status: Abnormal   Collection Time: 09/21/17  5:54 PM  Result Value Ref Range Status   Specimen Description   Final    BLOOD RIGHT  HAND Performed at [facility], [address] [place], [place] [address]    Special Requests   Final    BOTTLES DRAWN AEROBIC AND ANAEROBIC Blood Culture results may not be optimal due to an excessive volume of blood received in culture bottles Performed at [facility], [address] [place], [place] [address]    Culture  Setup Time   Final    GRAM POSITIVE COCCI IN BOTH AEROBIC AND ANAEROBIC BOTTLES CRITICAL RESULT CALLED TO, READ BACK BY AND VERIFIED WITH: MATT MCBANE AT 0508 ON 09/22/17 MMC. Performed at [facility], [address], [place], [place] [address]    Culture METHICILLIN RESISTANT STAPHYLOCOCCUS AUREUS (A)  Final   Report Status 09/24/2017 FINAL  Final   Organism ID, Bacteria METHICILLIN RESISTANT STAPHYLOCOCCUS AUREUS  Final      Susceptibility   Methicillin resistant staphylococcus aureus - MIC*    CIPROFLOXACIN >=8 RESISTANT Resistant     ERYTHROMYCIN >=8 RESISTANT Resistant     GENTAMICIN <=0.5 SENSITIVE Sensitive     OXACILLIN >=4 RESISTANT Resistant     TETRACYCLINE <=1 SENSITIVE Sensitive     VANCOMYCIN <=0.5 SENSITIVE  Sensitive     TRIMETH/SULFA <=10 SENSITIVE Sensitive     CLINDAMYCIN <=0.25 SENSITIVE Sensitive     RIFAMPIN <=0.5 SENSITIVE Sensitive     Inducible Clindamycin NEGATIVE Sensitive     * METHICILLIN RESISTANT STAPHYLOCOCCUS AUREUS  Blood Culture ID Panel (Reflexed)     Status: Abnormal   Collection Time: 09/21/17  5:54 PM  Result Value Ref Range Status   Enterococcus species NOT DETECTED NOT DETECTED Final   Listeria monocytogenes NOT DETECTED NOT DETECTED Final   Staphylococcus species DETECTED (A) NOT DETECTED Final    Comment: CRITICAL RESULT CALLED TO, READ BACK BY AND VERIFIED WITH: MATT MCBANE AT 0508 ON 09/22/17 MMC.    Staphylococcus aureus DETECTED (A) NOT DETECTED Final    Comment: Methicillin (oxacillin)-resistant Staphylococcus aureus (MRSA). MRSA is predictably resistant to beta-lactam antibiotics  (except ceftaroline). Preferred therapy is vancomycin unless clinically contraindicated. Patient requires contact precautions if  hospitalized. CRITICAL RESULT CALLED TO, READ BACK BY AND VERIFIED WITH: MATT MCBANE AT 0508 ON 09/22/17 MMC.    Methicillin resistance DETECTED (A) NOT DETECTED Final    Comment: CRITICAL RESULT CALLED TO, READ BACK BY AND VERIFIED WITH: MATT MCBANE AT 0508 ON 09/22/17 MMC.    Streptococcus species NOT DETECTED NOT DETECTED Final   Streptococcus agalactiae NOT DETECTED NOT DETECTED Final   Streptococcus pneumoniae NOT DETECTED NOT DETECTED Final   Streptococcus pyogenes NOT DETECTED NOT DETECTED Final   Acinetobacter baumannii NOT DETECTED NOT DETECTED Final   Enterobacteriaceae species NOT DETECTED NOT DETECTED Final   Enterobacter cloacae complex NOT DETECTED NOT DETECTED Final   Escherichia coli NOT DETECTED NOT DETECTED Final   Klebsiella oxytoca NOT DETECTED NOT DETECTED Final   Klebsiella pneumoniae NOT DETECTED NOT DETECTED Final   Proteus species NOT DETECTED NOT DETECTED Final   Serratia marcescens NOT DETECTED NOT DETECTED Final   Haemophilus influenzae NOT DETECTED NOT DETECTED Final   Neisseria meningitidis NOT DETECTED NOT DETECTED Final   Pseudomonas aeruginosa NOT DETECTED NOT DETECTED Final   Candida albicans NOT DETECTED NOT DETECTED Final   Candida glabrata NOT DETECTED NOT DETECTED Final   Candida krusei NOT DETECTED NOT DETECTED Final   Candida parapsilosis NOT DETECTED NOT DETECTED Final   Candida tropicalis NOT DETECTED NOT DETECTED Final    Comment: Performed at [facility], [address] [place], [place] [address]  Blood Culture (routine x 2)     Status: Abnormal   Collection Time: 09/21/17  6:05 PM  Result Value Ref Range Status   Specimen Description   Final    BLOOD RIGHT [facility] Performed at [facility], [address] [place], [place] [address]    Special Requests   Final    BOTTLES DRAWN AEROBIC AND  ANAEROBIC Blood Culture adequate volume Performed at [facility], [address] [place], [place] [address]    Culture  Setup Time   Final    GRAM POSITIVE COCCI IN BOTH AEROBIC AND ANAEROBIC BOTTLES CRITICAL RESULT CALLED TO, READ BACK BY AND VERIFIED WITH: MATT MCBANE AT 0508 ON 09/22/17 MMC. Performed at [facility], [address] [place], [place] [address]    Culture (A)  Final    STAPHYLOCOCCUS AUREUS SUSCEPTIBILITIES PERFORMED ON PREVIOUS CULTURE WITHIN THE LAST 5 DAYS. Performed at [facility], [address], [place], [place] [address]    Report Status 09/24/2017 FINAL  Final  MRSA PCR Screening     Status: Abnormal   Collection Time: 09/22/17 12:16  AM  Result Value Ref Range Status   MRSA by PCR POSITIVE (A) NEGATIVE Final    Comment:        The GeneXpert MRSA Assay (FDA approved for NASAL specimens only), is one component of a comprehensive MRSA colonization surveillance program. It is not intended to diagnose MRSA infection nor to guide or monitor treatment for MRSA infections. CRITICAL RESULT CALLED TO, READ BACK BY AND VERIFIED WITH: C/MARCELLA TURNER @0212  09/22/17 [facility] Performed at [facility], [address] [place], [place] [address]   CULTURE, BLOOD (ROUTINE X 2) w Reflex to ID Panel     Status: None (Preliminary result)   Collection Time: 09/22/17  9:03 AM  Result Value Ref Range Status   Specimen Description BLOOD RIGHT ANTECUBITAL  Final   Special Requests   Final    BOTTLES DRAWN AEROBIC AND ANAEROBIC Blood Culture adequate volume   Culture   Final    NO GROWTH 2 DAYS Performed at [facility], [address], [place], [place] [address]    Report Status PENDING  Incomplete  CULTURE, BLOOD (ROUTINE X 2) w Reflex to ID Panel     Status: None (Preliminary result)   Collection Time: 09/22/17  9:03 AM  Result Value Ref Range Status   Specimen Description BLOOD BLOOD LEFT HAND  Final   Special Requests    Final    BOTTLES DRAWN AEROBIC AND ANAEROBIC Blood Culture adequate volume   Culture   Final    NO GROWTH 2 DAYS Performed at [facility], [address], [place], [place] [address]    Report Status PENDING  Incomplete    Coagulation Studies: Recent Labs    09/21/17 2004-06-17 09/22/17 0805 09/23/17 0551 09/24/17 0737  LABPROT 40.4* 48.2* 57.7* 57.0*  INR 4.23* 5.30* 6.67* 6.57*    Urinalysis: No results for input(s): COLORURINE, LABSPEC, PHURINE, GLUCOSEU, HGBUR, BILIRUBINUR, KETONESUR, PROTEINUR, UROBILINOGEN, NITRITE, LEUKOCYTESUR in the last 72 hours.  Invalid input(s): APPERANCEUR    Imaging: No results found.   Medications:   . vancomycin Stopped (09/22/17 1646)   . aspirin  81 mg Oral Daily  . Chlorhexidine Gluconate Cloth  6 each Topical Q0600  . escitalopram  20 mg Oral QHS  . feeding supplement (NEPRO CARB STEADY)  237 mL Oral BID BM  . mouth rinse  15 mL Mouth Rinse BID  . midodrine  15 mg Oral TID AC  . multivitamin  1 tablet Oral QHS  . multivitamin with minerals  1 tablet Oral Daily  . multivitamin-lutein  1 capsule Oral Daily  . mupirocin ointment  1 application Nasal BID  . pantoprazole  40 mg Oral QAC breakfast  . senna-docusate  2 tablet Oral BID  . vitamin C  250 mg Oral BID  . Vitamin D (Ergocalciferol)  50,000 Units Oral Q7 days  . Warfarin - Pharmacist Dosing Inpatient   Does not apply q1800   acetaminophen **OR** acetaminophen, albuterol, alprazolam, ondansetron **OR** ondansetron (ZOFRAN) IV, polyethylene glycol, senna-docusate  Assessment/ Plan:  47 y.o. African-American male End-stage renal disease on hemodialysis MWF followed by [employer], chronic back pain, GERD, hypertension, seizure disorder, obstructive sleep apnea, anemia chronic kidney disease, secondary hyperparathyroidism, anxiety   [facility]/ [address] [facility]/ EDW 86 kg/ MWF/CVC  Patient admitted for cough, fever.  1.  End-stage renal disease -We will arrange for  hemodialysis Monday 06/18/22 Friday while in hospital - Next HD via temp cath when available  2.  Fever, cough.  Chest  x-ray shows pulmonary edema and bilateral pleural effusions -Blood cultures positive for gram-positive cocci.  ID panel shows MRSA -Differential includes pneumonia versus dialysis catheter infection - ID recommends cathter removal. No vascular surgeon on call. Will remove on Monday. Dr Wyn Quaker notified.   INR is high. May not be able to get temp cath until INR improves - currently getting iv vancomycin  3.  Anemia of chronic kidney disease -Hemoglobin 12.0.  Hold Procrit - iron deficiency. Avoid iv iron because of ongoing infection  4.  Secondary hyperparathyroidism -Monitor phosphorus during hospital admission Currently 3.8 - hold cinacalcet, sevelamer as poor PO intake  5. Replace Vit D  6. Coagulopathy INR 6.57 Warfarin on hold    LOS: 3 Trigg Delarocha Thedore Mins 9/8/201911:12 AM  [facility] [place], [place] [phone]  Note: This note was prepared with Dragon dictation. Any transcription errors are unintentional

## 2017-09-24 NOTE — Progress Notes (Signed)
Patient asked by nurse what reaction he has when taking dilaudid per pharmacy concern prior to approving IV morphine for pain. Patient stated " If I take too much I start hallucinating real bad". Pharmacy notified of patient's response and approved medication for patient's pain.

## 2017-09-25 ENCOUNTER — Inpatient Hospital Stay (HOSPITAL_COMMUNITY)
Admit: 2017-09-25 | Discharge: 2017-09-25 | Disposition: A | Discharge status: Home / Self Care | Payer: Medicare Other | Attending: Internal Medicine | Admitting: Internal Medicine

## 2017-09-25 ENCOUNTER — Encounter
Admission: EM | Disposition: A | Discharge status: Other Acute Inpatient Hospital | Payer: Self-pay | Source: Home / Self Care | Attending: Internal Medicine

## 2017-09-25 ENCOUNTER — Ambulatory Visit: Payer: Medicare Other | Admitting: Cardiovascular Disease

## 2017-09-25 DIAGNOSIS — I361 Nonrheumatic tricuspid (valve) insufficiency: Secondary | ICD-10-CM

## 2017-09-25 LAB — BASIC METABOLIC PANEL
Anion gap: 12 (ref 5–15)
BUN: 50 mg/dL — AB (ref 6–20)
CO2: 28 mmol/L (ref 22–32)
CREATININE: 7.43 mg/dL — AB (ref 0.61–1.24)
Calcium: 9 mg/dL (ref 8.9–10.3)
Chloride: 100 mmol/L (ref 98–111)
GFR calc Af Amer: 9 mL/min — ABNORMAL LOW (ref >60)
GFR, EST NON AFRICAN AMERICAN: 8 mL/min — AB (ref >60)
Glucose, Bld: 91 mg/dL (ref 70–99)
POTASSIUM: 3.3 mmol/L — AB (ref 3.5–5.1)
SODIUM: 140 mmol/L (ref 135–145)

## 2017-09-25 LAB — ECHOCARDIOGRAM COMPLETE
Height: 66 in
Weight: 2871.27 oz

## 2017-09-25 LAB — MISC LABCORP TEST (SEND OUT): LABCORP TEST CODE: 1479

## 2017-09-25 LAB — APTT: aPTT: 62 seconds — ABNORMAL HIGH (ref 24–36)

## 2017-09-25 LAB — PROTIME-INR
INR: 8.45 — AB
Prothrombin Time: 69.4 seconds — ABNORMAL HIGH (ref 11.4–15.2)

## 2017-09-25 LAB — VITAMIN K1, SERUM: VITAMIN K1: 0.17 ng/mL (ref 0.13–1.88)

## 2017-09-25 SURGERY — DIALYSIS/PERMA CATHETER REMOVAL
Anesthesia: Moderate Sedation

## 2017-09-25 MED ORDER — HEPARIN SODIUM (PORCINE) 10000 UNIT/ML IJ SOLN
INTRAMUSCULAR | Status: AC
  Filled 2017-09-25: qty 1

## 2017-09-25 MED ORDER — VITAMIN A 10000 UNITS PO CAPS
10000.0000 [IU] | ORAL_CAPSULE | Freq: Every day | ORAL | Status: DC
  Administered 2017-09-25 – 2017-09-26 (×2): 10000 [IU] via ORAL
  Filled 2017-09-25 (×4): qty 1

## 2017-09-25 MED ORDER — SODIUM CHLORIDE 0.9 % IV SOLN
Freq: Once | INTRAVENOUS | Status: AC
  Administered 2017-09-25: 15:00:00 via INTRAVENOUS
  Filled 2017-09-25: qty 10

## 2017-09-25 MED ORDER — PHYTONADIONE 5 MG PO TABS
5.0000 mg | ORAL_TABLET | Freq: Once | ORAL | Status: AC
  Administered 2017-09-25: 5 mg via ORAL
  Filled 2017-09-25 (×2): qty 1

## 2017-09-25 MED ORDER — DEXTROSE 5 % IV SOLN
Freq: Two times a day (BID) | INTRAVENOUS | Status: DC
  Administered 2017-09-25 – 2017-09-26 (×4): via INTRAVENOUS
  Filled 2017-09-25 (×7): qty 1

## 2017-09-25 MED ORDER — SODIUM CHLORIDE FLUSH 0.9 % IV SOLN
INTRAVENOUS | Status: AC
  Filled 2017-09-25: qty 10

## 2017-09-25 MED ORDER — VANCOMYCIN HCL IN DEXTROSE 1-5 GM/200ML-% IV SOLN
1000.0000 mg | INTRAVENOUS | Status: DC
  Administered 2017-09-26: 1000 mg via INTRAVENOUS
  Filled 2017-09-25: qty 200

## 2017-09-25 MED ORDER — SODIUM CHLORIDE 0.9 % IV SOLN: INTRAVENOUS | Status: DC

## 2017-09-25 MED ORDER — VITAMIN C 500 MG PO TABS: 250.0000 mg | ORAL_TABLET | Freq: Two times a day (BID) | ORAL | Status: DC

## 2017-09-25 MED ORDER — ASCORBIC ACID 500 MG/ML IJ SOLN
500.0000 mg | Freq: Two times a day (BID) | INTRAMUSCULAR | Status: DC
  Filled 2017-09-25 (×2): qty 1

## 2017-09-25 NOTE — Progress Notes (Signed)
[employer] [facility] [place] [facility]   PATIENT NAME: Perry [name]    MR#:  [id number]  DATE OF BIRTH:  1970/04/04  SUBJECTIVE:  CHIEF COMPLAINT:   Chief Complaint  Patient presents with  . Fever  Patient is slow to respond, hungry and generalized weakness weakness.  N.p.o. for dialysis cath removal.  Home oxygen by nasal cannula 2 L. REVIEW OF SYSTEMS:  CONSTITUTIONAL: No fever, has generalized weakness.  EYES: No blurred or double vision.  EARS, NOSE, AND THROAT: No tinnitus or ear pain.  RESPIRATORY: No cough, shortness of breath, wheezing or hemoptysis.  CARDIOVASCULAR: No chest pain, orthopnea, edema.  GASTROINTESTINAL: No nausea, vomiting, diarrhea or abdominal pain.  GENITOURINARY: No dysuria, hematuria.  ENDOCRINE: No polyuria, nocturia,  HEMATOLOGY: No anemia, easy bruising or bleeding SKIN: No rash or lesion. MUSCULOSKELETAL: No joint pain or arthritis.   NEUROLOGIC: No tingling, numbness, weakness.  PSYCHIATRY: No anxiety or depression.   ROS  DRUG ALLERGIES:   Allergies  Allergen Reactions  . Depakote [Divalproex Sodium] Other (See Comments)    Hallucinations   . Hydromorphone Other (See Comments)    VITALS:  Blood pressure (!) 102/26, pulse 89, temperature 97.8 F (36.6 C), temperature source Oral, resp. rate 20, height 5\' 6"  (1.676 m), weight 81.4 kg, SpO2 97 %.  PHYSICAL EXAMINATION:  GENERAL:  47 y.o.-year-old patient lying in the bed with no acute distress.  EYES: Pupils equal, round, reactive to light and accommodation. No scleral icterus. Extraocular muscles intact. HEENT: Head atraumatic, normocephalic. Oropharynx and nasopharynx clear.  NECK:  Supple, no jugular venous distention. No thyroid enlargement, no tenderness.  LUNGS: Normal breath sounds bilaterally, no wheezing, rales,rhonchi or crepitation. No use of accessory muscles of respiration.  CARDIOVASCULAR: S1, S2 normal. No murmurs, rubs, or gallops.  ABDOMEN: Soft,  nontender, nondistended. Bowel sounds present. No organomegaly or mass.  EXTREMITIES: No pedal edema, cyanosis, or clubbing.  NEUROLOGIC: Cranial nerves II through XII are intact. Muscle strength 3-4/5 in all extremities.  Left-sided weakness.  Sensation intact. Gait not checked.  PSYCHIATRIC: The patient is alert and oriented x 3.  SKIN: No obvious rash, lesion, or ulcer.   Physical Exam LABORATORY PANEL:   CBC Recent Labs  Lab 09/21/17 1754  WBC 10.0  HGB 12.0*  HCT 37.4*  PLT 253   ------------------------------------------------------------------------------------------------------------------  Chemistries  Recent Labs  Lab 09/21/17 1754 09/25/17 0443  NA 141 140  K 3.1* 3.3*  CL 98 100  CO2 28 28  GLUCOSE 117* 91  BUN 28* 50*  CREATININE 6.03* 7.43*  CALCIUM 8.1* 9.0  AST 27  --   ALT 18  --   ALKPHOS 86  --   BILITOT 1.4*  --    ------------------------------------------------------------------------------------------------------------------  Cardiac Enzymes No results for input(s): TROPONINI in the last 168 hours. ------------------------------------------------------------------------------------------------------------------  RADIOLOGY:  No results found.  ASSESSMENT AND PLAN:  Viraaj Vorndran  is a 47 y.o. male with a known history of stage renal disease on hemodialysis, seizures, sleep apnea on CPAP, stroke with left sided hemiparesis where patient is bedbound and requires total care, comes to the emergency room with fever of 102 shortness of breath and cough. Patient is not able to cough up any phlegm. He a temperature of 101.3 the emergency room with mild tachycardia. Blood pressure was based on the lower side received his middle drain. Chest x-ray shows bilateral pulmonary edema. Sats are 96% on 2 L. Patient is being admitted with febrile illness rule out pneumonia.  *  Acute MRSA bacteremia Continue vancomycin, need to remove the dialysis catheter.   Follow-up 2D echo/TEE per Dr. Joylene Draft.   Repeat blood cultures are negative so far.  * Acute metabolic encephalopathy Suspect secondary to above Noted history of CVA with left hemiparesis Discontinued Neurontin. Mental status is improving.  *Chronic  ESRD on HD MWF Dialysis catheter removal and hold dialysis today per Dr. Cherylann Ratel.  *History of  CVA with left hemiparesis/functional quadriplegia Plan of care as stated above Long-term prognosis is poor  *Chronic orthostatic hypotension  Continue Midodrine  *Chornic anticoagulation  Hold Coumadin due to supratherapeutic INR.  *Acute Coumadin toxicity, INR is elevated up to 8.45, no active bleeding. Hold Coumadin, check INR every a.m.  Hypokalemia.  Adjust during hemodialysis. Follow-up BMP.  All the records are reviewed and case discussed with Care Management/Social Workerr. Management plans discussed with the patient, his mother and they are in agreement.  CODE STATUS: dnr  TOTAL TIME TAKING CARE OF THIS PATIENT: 36 minutes.  POSSIBLE D/C IN 2-3 DAYS, DEPENDING ON CLINICAL CONDITION.   Shaune Pollack M.D on 09/25/2017   Between 7am to 6pm - Pager - [phone]  After 6pm go to [email or web address] - Social research officer, government  [facility]  Office  [phone]  CC: Primary care physician; Center, [facility]  Note: This dictation was prepared with Dragon dictation along with smaller phrase technology. Any transcriptional errors that result from this process are unintentional.

## 2017-09-25 NOTE — Progress Notes (Signed)
Nutrition Follow Up Note   DOCUMENTATION CODES:   Not applicable  INTERVENTION:   Nepro Shake po BID, each supplement provides 425 kcal and 19 grams protein  MVI daily  Rena-vite daily  Vitamin C 266m po BID  Ocuvite daily for wound healing (provides zinc, vitamin A, and copper)  Vitamin A 10,000 units po daily   NUTRITION DIAGNOSIS:   Increased nutrient needs related to chronic illness as evidenced by increased estimated needs.  GOAL:   Patient will meet greater than or equal to 90% of their needs  -not met   MONITOR:   PO intake, Supplement acceptance, Labs, Weight trends, I & O's, Skin  ASSESSMENT:   47y.o. male with a known history of stage renal disease on hemodialysis, h/o gastric sleeve, seizures, sleep apnea on CPAP, stroke with left sided hemiparesis where patient is bedbound and requires total care, comes to the emergency room with fever of 102 shortness of breath and cough. Pt found to have PNA with sepsis    Met with pt in room today. Pt sleeping and difficult to arouse but reports poor appetite and oral intake pta. Pt reports that he likes the nepro supplement but unable to obtain much nutrition related information r/t lethargy. Vitamin labs sent out r/t h/o gastric sleeve and chronic HD. Pt with multiple nutrient deficiencies requiring supplementation. 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 RD following for vitamin repletion. Recommend continue supplements to replace protein losses from HD. Pt currently only eating sips and bites of meals. Pt currently NPO for perm cath removal today. Pt may require NGT placement and nutrition support to help him meet his estimated needs. Will provide vitamin C and multivitamin IV today.    Medications reviewed and include: aspirin, cinacalcet, rena-vite, MVI, ocuvite, protonix, senokot, renvela, vancomycin, vitamin C, warfarin    Labs reviewed: K 3.3(L), BUN 50(H), creat 7.43(H) Iron- 14(L), TIBC 201(L), folate  7.3- 9/6 Copper- 71(L), B1 107.6 wnl, Vitamin D 10.6(L), Vitamin A 6.6(L), B12 964(H), vitamin E 15.8 wnl, zinc 51(L)- 9/6 INR- 8.45(H) iPTH- 132(H)- 7/11  Diet Order:   Diet Order            Diet NPO time specified  Diet effective now             EDUCATION NEEDS:   Not appropriate for education at this time  Skin:  Skin Assessment: Reviewed RN Assessment(wound right toe, 2X.2X.1cm )  Last BM:  9/6- type 7  Height:   Ht Readings from Last 1 Encounters:  09/22/17 _0  (1.676 m)    Weight:   Wt Readings from Last 1 Encounters:  09/22/17 81.4 kg    Ideal Body Weight:  64.5 kg  BMI:  Body mass index is 28.96 kg/m.  Estimated Nutritional Needs:   Kcal:  2100-2400kcal/day   Protein:  98-114g/day   Fluid:  UOP + 1L  CKoleen DistanceMS, RD, LDN Pager #- [phone]#- [phone] Hours Pager: [phone]

## 2017-09-25 NOTE — Plan of Care (Signed)

## 2017-09-25 NOTE — Progress Notes (Signed)
*  PRELIMINARY RESULTS* Echocardiogram 2D Echocardiogram has been performed.  Perry [name] 09/25/2017, 12:56 PM
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
## 2017-09-25 NOTE — Progress Notes (Signed)
The patient's IRN level has been trending in the pass few days and it keeps increasing current INR level is 8.45. No signs of bleeding at this point. Dr. Imogene Burn was informed of increase levels of INR and suggested vitamin K yet it was indicated to hold on at this point. Currently monitoring the patient.

## 2017-09-25 NOTE — Progress Notes (Signed)
[facility], [place] 09/25/17  Subjective:  Patient still awaiting PermCath removal. Resting comfortably in bed. Arousable and conversant.   Objective:  Vital signs in last 24 hours:  Temp:  [97.5 F (36.4 C)-97.8 F (36.6 C)] 97.7 F (36.5 C) (09/09 0748) Pulse Rate:  [89-93] 90 (09/09 0748) Resp:  [20] 20 (09/08 2025) BP: (91-114)/(33-36) 91/36 (09/09 0748) SpO2:  [97 %-100 %] 100 % (09/09 0748)  Weight change:  Filed Weights   09/21/17 1746 09/22/17 0100 09/22/17 1215  Weight: 85.7 kg 81.4 kg 81.4 kg    Intake/Output:   No intake or output data in the 24 hours ending 09/25/17 1129   Physical Exam: General:  No acute distress, laying in the bed, chronically ill-appearing  HEENT  moist oral mucous membranes  Neck  supple  Pulm/lungs  scattered rhonchi, normal effort  CVS/Heart  S1S2 no rubs  Abdomen:   Soft, nontender, BS present  Extremities:  Left arm edema  Neurologic:  Alert, oriented, left-sided weakness  Skin:  No acute rashes  Access:  Left IJ PC       Basic Metabolic Panel:  Recent Labs  Lab 09/21/17 1754 09/22/17 1251 09/25/17 0443  NA 141  --  140  K 3.1*  --  3.3*  CL 98  --  100  CO2 28  --  28  GLUCOSE 117*  --  91  BUN 28*  --  50*  CREATININE 6.03*  --  7.43*  CALCIUM 8.1*  --  9.0  PHOS  --  3.8  --      CBC: Recent Labs  Lab 09/21/17 1754  WBC 10.0  NEUTROABS 9.2*  HGB 12.0*  HCT 37.4*  MCV 83.4  PLT 253      Lab Results  Component Value Date   HEPBSAG Negative 09/22/2017   HEPBSAB Non Reactive 07/11/2016      Microbiology:  Recent Results (from the past 240 hour(s))  Blood Culture (routine x 2)     Status: Abnormal   Collection Time: 09/21/17  5:54 PM  Result Value Ref Range Status   Specimen Description   Final    BLOOD RIGHT HAND Performed at [facility], [address] [place], [place] [address]    Special Requests   Final    BOTTLES DRAWN AEROBIC AND ANAEROBIC  Blood Culture results may not be optimal due to an excessive volume of blood received in culture bottles Performed at [facility], [address] [place], [place] [address]    Culture  Setup Time   Final    GRAM POSITIVE COCCI IN BOTH AEROBIC AND ANAEROBIC BOTTLES CRITICAL RESULT CALLED TO, READ BACK BY AND VERIFIED WITH: MATT MCBANE AT 0508 ON 09/22/17 [facility]. Performed at [facility], [address] [place], [place] [address]    Culture METHICILLIN RESISTANT STAPHYLOCOCCUS AUREUS (A)  Final   Report Status 09/24/2017 FINAL  Final   Organism ID, Bacteria METHICILLIN RESISTANT STAPHYLOCOCCUS AUREUS  Final      Susceptibility   Methicillin resistant staphylococcus aureus - MIC*    CIPROFLOXACIN >=8 RESISTANT Resistant     ERYTHROMYCIN >=8 RESISTANT Resistant     GENTAMICIN <=0.5 SENSITIVE Sensitive     OXACILLIN >=4 RESISTANT Resistant     TETRACYCLINE <=1 SENSITIVE Sensitive     VANCOMYCIN <=0.5 SENSITIVE Sensitive     TRIMETH/SULFA <=10 SENSITIVE Sensitive     CLINDAMYCIN <=0.25 SENSITIVE Sensitive     RIFAMPIN <=0.5  SENSITIVE Sensitive     Inducible Clindamycin NEGATIVE Sensitive     * METHICILLIN RESISTANT STAPHYLOCOCCUS AUREUS  Blood Culture ID Panel (Reflexed)     Status: Abnormal   Collection Time: 09/21/17  5:54 PM  Result Value Ref Range Status   Enterococcus species NOT DETECTED NOT DETECTED Final   Listeria monocytogenes NOT DETECTED NOT DETECTED Final   Staphylococcus species DETECTED (A) NOT DETECTED Final    Comment: CRITICAL RESULT CALLED TO, READ BACK BY AND VERIFIED WITH: MATT MCBANE AT 0508 ON 09/22/17 MMC.    Staphylococcus aureus DETECTED (A) NOT DETECTED Final    Comment: Methicillin (oxacillin)-resistant Staphylococcus aureus (MRSA). MRSA is predictably resistant to beta-lactam antibiotics (except ceftaroline). Preferred therapy is vancomycin unless clinically contraindicated. Patient requires contact precautions if  hospitalized. CRITICAL  RESULT CALLED TO, READ BACK BY AND VERIFIED WITH: MATT MCBANE AT 0508 ON 09/22/17 MMC.    Methicillin resistance DETECTED (A) NOT DETECTED Final    Comment: CRITICAL RESULT CALLED TO, READ BACK BY AND VERIFIED WITH: MATT MCBANE AT 0508 ON 09/22/17 MMC.    Streptococcus species NOT DETECTED NOT DETECTED Final   Streptococcus agalactiae NOT DETECTED NOT DETECTED Final   Streptococcus pneumoniae NOT DETECTED NOT DETECTED Final   Streptococcus pyogenes NOT DETECTED NOT DETECTED Final   Acinetobacter baumannii NOT DETECTED NOT DETECTED Final   Enterobacteriaceae species NOT DETECTED NOT DETECTED Final   Enterobacter cloacae complex NOT DETECTED NOT DETECTED Final   Escherichia coli NOT DETECTED NOT DETECTED Final   Klebsiella oxytoca NOT DETECTED NOT DETECTED Final   Klebsiella pneumoniae NOT DETECTED NOT DETECTED Final   Proteus species NOT DETECTED NOT DETECTED Final   Serratia marcescens NOT DETECTED NOT DETECTED Final   Haemophilus influenzae NOT DETECTED NOT DETECTED Final   Neisseria meningitidis NOT DETECTED NOT DETECTED Final   Pseudomonas aeruginosa NOT DETECTED NOT DETECTED Final   Candida albicans NOT DETECTED NOT DETECTED Final   Candida glabrata NOT DETECTED NOT DETECTED Final   Candida krusei NOT DETECTED NOT DETECTED Final   Candida parapsilosis NOT DETECTED NOT DETECTED Final   Candida tropicalis NOT DETECTED NOT DETECTED Final    Comment: Performed at [facility], [address] [place], [place] [address]  Blood Culture (routine x 2)     Status: Abnormal   Collection Time: 09/21/17  6:05 PM  Result Value Ref Range Status   Specimen Description   Final    BLOOD RIGHT [facility] Performed at [facility], [address] [place], [place] [address]    Special Requests   Final    BOTTLES DRAWN AEROBIC AND ANAEROBIC Blood Culture adequate volume Performed at [facility], [address] [place], [place] [address]    Culture  Setup Time   Final     GRAM POSITIVE COCCI IN BOTH AEROBIC AND ANAEROBIC BOTTLES CRITICAL RESULT CALLED TO, READ BACK BY AND VERIFIED WITH: MATT MCBANE AT 0508 ON 09/22/17 [facility]. Performed at [facility], [address] [place], [place] [address]    Culture (A)  Final    STAPHYLOCOCCUS AUREUS SUSCEPTIBILITIES PERFORMED ON PREVIOUS CULTURE WITHIN THE LAST 5 DAYS. Performed at [facility], [address], [place], [place] [address]    Report Status 09/24/2017 FINAL  Final  MRSA PCR Screening     Status: Abnormal   Collection Time: 09/22/17 12:16 AM  Result Value Ref Range Status   MRSA by PCR POSITIVE (A) NEGATIVE Final    Comment:  The GeneXpert MRSA Assay (FDA approved for NASAL specimens only), is one component of a comprehensive MRSA colonization surveillance program. It is not intended to diagnose MRSA infection nor to guide or monitor treatment for MRSA infections. CRITICAL RESULT CALLED TO, READ BACK BY AND VERIFIED WITH: C/MARCELLA TURNER @0212  09/22/17 [facility] Performed at [facility], [address] [place], [place] [address]   CULTURE, BLOOD (ROUTINE X 2) w Reflex to ID Panel     Status: None (Preliminary result)   Collection Time: 09/22/17  9:03 AM  Result Value Ref Range Status   Specimen Description BLOOD RIGHT ANTECUBITAL  Final   Special Requests   Final    BOTTLES DRAWN AEROBIC AND ANAEROBIC Blood Culture adequate volume   Culture   Final    NO GROWTH 3 DAYS Performed at [facility], [address], [place], [place] [address]    Report Status PENDING  Incomplete  CULTURE, BLOOD (ROUTINE X 2) w Reflex to ID Panel     Status: None (Preliminary result)   Collection Time: 09/22/17  9:03 AM  Result Value Ref Range Status   Specimen Description BLOOD BLOOD LEFT HAND  Final   Special Requests   Final    BOTTLES DRAWN AEROBIC AND ANAEROBIC Blood Culture adequate volume   Culture   Final    NO GROWTH 3 DAYS Performed at [facility], [address], [place], [place] [address]    Report Status PENDING  Incomplete    Coagulation Studies: Recent Labs    09/23/17 0551 09/24/17 0737 09/25/17 0443  LABPROT 57.7* 57.0* 69.4*  INR 6.67* 6.57* 8.45*    Urinalysis: No results for input(s): COLORURINE, LABSPEC, PHURINE, GLUCOSEU, HGBUR, BILIRUBINUR, KETONESUR, PROTEINUR, UROBILINOGEN, NITRITE, LEUKOCYTESUR in the last 72 hours.  Invalid input(s): APPERANCEUR    Imaging: No results found.   Medications:   . sodium chloride    . vancomycin Stopped (09/22/17 1646)   . aspirin  81 mg Oral Daily  . Chlorhexidine Gluconate Cloth  6 each Topical Q0600  . escitalopram  20 mg Oral QHS  . feeding supplement (NEPRO CARB STEADY)  237 mL Oral BID BM  . mouth rinse  15 mL Mouth Rinse BID  . midodrine  15 mg Oral TID AC  . multivitamin  1 tablet Oral QHS  . multivitamin with minerals  1 tablet Oral Daily  . multivitamin-lutein  1 capsule Oral Daily  . mupirocin ointment  1 application Nasal BID  . pantoprazole  40 mg Oral QAC breakfast  . senna-docusate  2 tablet Oral BID  . vitamin A  10,000 Units Oral Daily  . vitamin C  250 mg Oral BID  . Vitamin D (Ergocalciferol)  50,000 Units Oral Q7 days  . Warfarin - Pharmacist Dosing Inpatient   Does not apply q1800   acetaminophen **OR** acetaminophen, albuterol, alprazolam, morphine injection, ondansetron **OR** ondansetron (ZOFRAN) IV, polyethylene glycol, senna-docusate  Assessment/ Plan:  47 y.o. African-American male End-stage renal disease on hemodialysis MWF followed by [employer], chronic back pain, GERD, hypertension, seizure disorder, obstructive sleep apnea, anemia chronic kidney disease, secondary hyperparathyroidism, anxiety   [facility]/ [address] [facility]/ EDW 86 kg/ MWF/CVC  Patient admitted for cough, fever.  1.  End-stage renal disease -Blood pressure bit low today.  Hold hemodialysis at the moment.  Temporary dialysis catheter to be removed today.  2.   Fever, cough.  Chest x-ray shows pulmonary edema and bilateral pleural effusions -Blood cultures positive for gram-positive cocci.  ID panel shows MRSA -Differential includes pneumonia versus dialysis catheter infection - ID recommends cathter removal. -Temporary dialysis catheter to be removed today.  3.  Anemia of chronic kidney disease -Hemoglobin 12.0.  Hold epogen.   4.  Secondary hyperparathyroidism -Monitor phosphorus during hospital admission Currently 3.8 - hold cinacalcet, sevelamer as poor PO intake  5. Coagulopathy INR higher at 8.45, may need to consider FFP, defer to hospitalist.     LOS: 4 Graysen Depaula 9/9/201911:28 AM  [facility] [place], [place] [phone]  Note: This note was prepared with Dragon dictation. Any transcription errors are unintentional

## 2017-09-25 NOTE — Progress Notes (Signed)
Perry [name] is a 47 y.o. male with h/o ESRD, on dialysis, HTN,CVA, MRSA bacteremia due to HD catheter in April 2019 and was treated with antibiotics and replacement of catheter, gastric bypass  Is admitted with fever of 1 day duration.  Pt says he developed fever 1 day ago.  He also has a cough but not bringing up any sputum.  He lives at home with his mom and is usually in bed as his mom is worried he may fall because of stroke.  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 In April 2019 he was at [facility] for sepsis and had MRSA bacteremia  and catheter was changed and during that admission he had acute rt MCA stroke   OBJECTIVE:  Patient Vitals for the past 24 hrs:  BP Temp Temp src Pulse Resp SpO2  09/25/17 1136 (!) 102/26 97.8 F (36.6 C) Oral 89 - 97 %  09/25/17 0748 (!) 91/36 97.7 F (36.5 C) Oral 90 - 100 %  09/25/17 0605 (!) 109/33 97.8 F (36.6 C) Oral 93 - 97 %  09/24/17 2026 (!) 91/36 - - 89 - -  09/24/17 2025 (!) 114/34 (!) 97.5 F (36.4 C) Oral 91 20 100 %  Physical Exam  Constitutional:  sleepy Eyes:  Sclera seen all around- exopthalmos like appaearance  Neck: Normal range of motion.  CVS s1s2 2/6 systolic murmur  Left sided tunneled catheter  RS b/l air entry decreased both bases  abd soft  Cns Left hemiplegia  Skin over the face -hyperpigmented    Lab Results  CBC  CBC Latest Ref Rng & Units 09/21/2017 08/21/2017 08/18/2017  WBC 3.8 - 10.6 K/uL 10.0 4.4 5.1  Hemoglobin 13.0 - 18.0 g/dL 12.0(L) 12.7(L) 12.3(L)  Hematocrit 40.0 - 52.0 % 37.4(L) 40.9 39.3(L)  Platelets 150 - 440 K/uL 253 316 277   CMP Latest Ref Rng & Units 09/25/2017 09/21/2017 08/21/2017  Glucose 70 - 99 mg/dL 91 993(T) 92  BUN 6 - 20 mg/dL 70(V) 77(L) 39(Q)  Creatinine 0.61 - 1.24 mg/dL 3.00(P) 2.33(A) 0.76(A)  Sodium 135 - 145 mmol/L 140 141 143  Potassium 3.5 - 5.1 mmol/L 3.3(L) 3.1(L) 4.3  Chloride 98 - 111 mmol/L 100 98 107  CO2 22 - 32 mmol/L  28 28 26   Calcium 8.9 - 10.3 mg/dL 9.0 2.6(J) 3.3(L)  Total Protein 6.5 - 8.1 g/dL - 6.5 6.5  Total Bilirubin 0.3 - 1.2 mg/dL - 4.5(G) 0.7  Alkaline Phos 38 - 126 U/L - 86 104  AST 15 - 41 U/L - 27 13(L)  ALT 0 - 44 U/L - 18 23   9/5 Bc- 4/4 bottles positive for MRSA vanco MIC < 0.5  MRSA nares - positive 9/6 BC- NG so far  Radiographs and labs were personally reviewed by me.  Assessment and Plan  47 y.o. male with h/o ESRD, on dialysis, HTN,CVA, MRSA bacteremia due to HD catheter in April 2019 and was treated with antibiotics and replacement of catheter, gastric bypass  Is admitted with fever of 1 day duration.  Pt says he developed fever 1 day ago.  He also has a cough but not bringing up any sputum.  MRSA bacteremia causing fever.  Likely source dialysis catheter. Had similar problem in April 2019 and catheter was replaced and he was treated with 4 weeks of vanco ( TEE was neg then)  Will need to remove the catheter. He will need  2D echo/TEE . Awaiting cath removal- has a very high INR today On vanco   Cough and SOB- likely due to CHF - if concern for pneumonia need to r/o septic emboli to lungs ESRD on hemodialysis  CVA -left hemiparesis   Discussed the management with his mother

## 2017-09-25 NOTE — Progress Notes (Signed)
Dr Sheryle Hail notified  PT 69.4, INR 8.45, no new orders given.

## 2017-09-25 NOTE — Progress Notes (Signed)
SLP Cancellation Note  Patient Details Name: Perry [name] MRN: [id number] DOB: Jul 28, 1970   Cancelled treatment:       Reason Eval/Treat Not Completed: Patient at procedure or test/unavailable(chart reviewed; consulted NSG). NSG reported pt was NPO for procedure today. ST services will f/u tomorrow. Reminded NSG pt was on a Dysphagia diet w/ Honey consistency liquids when he has his diet order reinstated later today. NSG agreed. ST services will f/u tomorrow.     Jerilynn Som, MS, CCC-SLP Watson,Katherine 09/25/2017, 4:41 PM

## 2017-09-26 LAB — RENAL FUNCTION PANEL
ALBUMIN: 2.4 g/dL — AB (ref 3.5–5.0)
ANION GAP: 18 — AB (ref 5–15)
BUN: 65 mg/dL — ABNORMAL HIGH (ref 6–20)
CALCIUM: 8.8 mg/dL — AB (ref 8.9–10.3)
CO2: 27 mmol/L (ref 22–32)
CREATININE: 9.26 mg/dL — AB (ref 0.61–1.24)
Chloride: 98 mmol/L (ref 98–111)
GFR, EST AFRICAN AMERICAN: 7 mL/min — AB (ref >60)
GFR, EST NON AFRICAN AMERICAN: 6 mL/min — AB (ref >60)
Glucose, Bld: 101 mg/dL — ABNORMAL HIGH (ref 70–99)
Phosphorus: 3.9 mg/dL (ref 2.5–4.6)
Potassium: 3.8 mmol/L (ref 3.5–5.1)
SODIUM: 143 mmol/L (ref 135–145)

## 2017-09-26 LAB — PROTIME-INR
INR: 2.64
PROTHROMBIN TIME: 28 s — AB (ref 11.4–15.2)

## 2017-09-26 LAB — CBC
HCT: 36.9 % — ABNORMAL LOW (ref 40.0–52.0)
HEMOGLOBIN: 11.5 g/dL — AB (ref 13.0–18.0)
MCH: 26.5 pg (ref 26.0–34.0)
MCHC: 31.1 g/dL — ABNORMAL LOW (ref 32.0–36.0)
MCV: 85.3 fL (ref 80.0–100.0)
PLATELETS: 397 10*3/uL (ref 150–440)
RBC: 4.33 MIL/uL — AB (ref 4.40–5.90)
RDW: 21.4 % — ABNORMAL HIGH (ref 11.5–14.5)
WBC: 10.6 10*3/uL (ref 3.8–10.6)

## 2017-09-26 MED ORDER — WARFARIN SODIUM 3 MG PO TABS
3.0000 mg | ORAL_TABLET | Freq: Every day | ORAL | Status: DC
  Administered 2017-09-26: 3 mg via ORAL
  Filled 2017-09-26 (×2): qty 1

## 2017-09-26 NOTE — Progress Notes (Signed)
[employer] - [facility] at [facility]   PATIENT NAME: Perry [name]    MR#:  [id number]  DATE OF BIRTH:  12/13/1970  SUBJECTIVE:  CHIEF COMPLAINT:   Chief Complaint  Patient presents with  . Fever  Patient is slow to respond, hungry and generalized weakness weakness.  N.p.o. for dialysis cath removal.  Home oxygen by nasal cannula 2 L. REVIEW OF SYSTEMS:  CONSTITUTIONAL: No fever, has generalized weakness.  EYES: No blurred or double vision.  EARS, NOSE, AND THROAT: No tinnitus or ear pain.  RESPIRATORY: No cough, shortness of breath, wheezing or hemoptysis.  CARDIOVASCULAR: No chest pain, orthopnea, edema.  GASTROINTESTINAL: No nausea, vomiting, diarrhea or abdominal pain.  GENITOURINARY: No dysuria, hematuria.  ENDOCRINE: No polyuria, nocturia,  HEMATOLOGY: No anemia, easy bruising or bleeding SKIN: No rash or lesion. MUSCULOSKELETAL: No joint pain or arthritis.   NEUROLOGIC: No tingling, numbness, weakness.  PSYCHIATRY: No anxiety or depression.   ROS  DRUG ALLERGIES:   Allergies  Allergen Reactions  . Depakote [Divalproex Sodium] Other (See Comments)    Hallucinations   . Hydromorphone Other (See Comments)    VITALS:  Blood pressure (!) 87/54, pulse 87, temperature 98.1 F (36.7 C), temperature source Oral, resp. rate 20, height 5\' 6"  (1.676 m), weight 80.7 kg, SpO2 100 %.  PHYSICAL EXAMINATION:  GENERAL:  47 y.o.-year-old patient lying in the bed with no acute distress.  EYES: Pupils equal, round, reactive to light and accommodation. No scleral icterus. Extraocular muscles intact. HEENT: Head atraumatic, normocephalic. Oropharynx and nasopharynx clear.  NECK:  Supple, no jugular venous distention. No thyroid enlargement, no tenderness.  LUNGS: Normal breath sounds bilaterally, no wheezing, rales,rhonchi or crepitation. No use of accessory muscles of respiration.  CARDIOVASCULAR: S1, S2 normal. No murmurs, rubs, or gallops.  ABDOMEN: Soft,  nontender, nondistended. Bowel sounds present. No organomegaly or mass.  EXTREMITIES: No pedal edema, cyanosis, or clubbing.  NEUROLOGIC: Cranial nerves II through XII are intact. Muscle strength 3-4/5 in all extremities.  Left-sided weakness.  Sensation intact. Gait not checked.  PSYCHIATRIC: The patient is alert and oriented x 3.  SKIN: No obvious rash, lesion, or ulcer.   Physical Exam LABORATORY PANEL:   CBC Recent Labs  Lab 09/21/17 1754  WBC 10.0  HGB 12.0*  HCT 37.4*  PLT 253   ------------------------------------------------------------------------------------------------------------------  Chemistries  Recent Labs  Lab 09/21/17 1754 09/25/17 0443  NA 141 140  K 3.1* 3.3*  CL 98 100  CO2 28 28  GLUCOSE 117* 91  BUN 28* 50*  CREATININE 6.03* 7.43*  CALCIUM 8.1* 9.0  AST 27  --   ALT 18  --   ALKPHOS 86  --   BILITOT 1.4*  --    ------------------------------------------------------------------------------------------------------------------  Cardiac Enzymes No results for input(s): TROPONINI in the last 168 hours. ------------------------------------------------------------------------------------------------------------------  RADIOLOGY:  No results found.  ASSESSMENT AND PLAN:  Burce Toye  is a 47 y.o. male with a known history of stage renal disease on hemodialysis, seizures, sleep apnea on CPAP, stroke with left sided hemiparesis where patient is bedbound and requires total care, comes to the emergency room with fever of 102 shortness of breath and cough. Patient is not able to cough up any phlegm. He a temperature of 101.3 the emergency room with mild tachycardia. Blood pressure was based on the lower side received his middle drain. Chest x-ray shows bilateral pulmonary edema. Sats are 96% on 2 L. Patient is being admitted with febrile illness rule out pneumonia.  *  Acute MRSA bacteremia Continue vancomycin, need to remove the dialysis catheter.  2D  echo is unremarkable, TEE tomorrow per cardiology PA Mr. Shea Evans.   Repeat blood cultures are negative so far.  * Acute metabolic encephalopathy Suspect secondary to above Noted history of CVA with left hemiparesis Discontinued Neurontin. Mental status is improved.  *Chronic  ESRD on HD MWF Dialysis catheter removal after dialysis today per Dr. Cherylann Ratel.  *History of  CVA with left hemiparesis/functional quadriplegia Plan of care as stated above Long-term prognosis is poor  *Chronic orthostatic hypotension  Continue Midodrine  *Chornic anticoagulation  Hold Coumadin due to supratherapeutic INR.  *Acute Coumadin toxicity, INR is decreased to 2.54 after 1 dose p.o. vitamin K 5 mg, no active bleeding. Hold Coumadin for procedure.  Hypokalemia.  Adjust during hemodialysis. Follow-up BMP.  Discussed with cardiology PA Mr. Shea Evans. All the records are reviewed and case discussed with Care Management/Social Workerr. Management plans discussed with the patient, his mother and they are in agreement.  CODE STATUS: dnr  TOTAL TIME TAKING CARE OF THIS PATIENT: 33 minutes.  POSSIBLE D/C IN 2-3 DAYS, DEPENDING ON CLINICAL CONDITION.   Shaune Pollack M.D on 09/26/2017   Between 7am to 6pm - Pager - [phone]  After 6pm go to [email or web address] - Social research officer, government  [facility]  Office  [phone]  CC: Primary care physician; Center, [facility]  Note: This dictation was prepared with Dragon dictation along with smaller phrase technology. Any transcriptional errors that result from this process are unintentional.

## 2017-09-26 NOTE — Plan of Care (Signed)
The patient has dialysis done today. 1.5L of fluid removed per dialysis nurses. The BP has been low after the patient has returned from dialysis. We are currently monitoring blood pressure and schedule medication provided to help improve Bp. TEE to be performed tomorrow as well as catheter removal.  Problem: Education: Goal: Knowledge of General Education information will improve Description Including pain rating scale, medication(s)/side effects and non-pharmacologic comfort measures Outcome: Progressing   Problem: Health Behavior/Discharge Planning: Goal: Ability to manage health-related needs will improve Outcome: Progressing   Problem: Clinical Measurements: Goal: Ability to maintain clinical measurements within normal limits will improve Outcome: Progressing Goal: Will remain free from infection Outcome: Progressing Goal: Diagnostic test results will improve Outcome: Progressing Goal: Respiratory complications will improve Outcome: Progressing Goal: Cardiovascular complication will be avoided Outcome: Progressing   Problem: Activity: Goal: Risk for activity intolerance will decrease Outcome: Progressing   Problem: Nutrition: Goal: Adequate nutrition will be maintained Outcome: Progressing   Problem: Coping: Goal: Level of anxiety will decrease Outcome: Progressing   Problem: Elimination: Goal: Will not experience complications related to bowel motility Outcome: Progressing Goal: Will not experience complications related to urinary retention Outcome: Progressing   Problem: Pain Managment: Goal: General experience of comfort will improve Outcome: Progressing   Problem: Safety: Goal: Ability to remain free from injury will improve Outcome: Progressing   Problem: Skin Integrity: Goal: Risk for impaired skin integrity will decrease Outcome: Progressing   Problem: Fluid Volume: Goal: Hemodynamic stability will improve Outcome: Progressing   Problem: Clinical  Measurements: Goal: Diagnostic test results will improve Outcome: Progressing Goal: Signs and symptoms of infection will decrease Outcome: Progressing   Problem: Respiratory: Goal: Ability to maintain adequate ventilation will improve Outcome: Progressing
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
## 2017-09-26 NOTE — Progress Notes (Signed)
SLP Cancellation Note  Patient Details Name: MUSIQ VEST MRN: [id number] DOB: 16-May-1970   Cancelled treatment:       Reason Eval/Treat Not Completed: Patient at procedure or test/unavailable(chart reviewed; pt remains at HD out of room). Will f/u w/ pt in the morning for dysphagia tx, toleration of diet, trials to upgrade to Nectar consistency liquids in his diet.     Jerilynn Som, MS, CCC-SLP Watson,Katherine 09/26/2017, 3:53 PM

## 2017-09-26 NOTE — Progress Notes (Signed)
Pre hd 

## 2017-09-26 NOTE — Progress Notes (Signed)
RN contacted on-call doctor, Dr. Tobi Bastos, in regards to pts BP of 90/22. Pt received midodrine at 1816 after returning from dialysis. No new orders at this time. Will continue to monitor.

## 2017-09-26 NOTE — Progress Notes (Signed)
Tolerated treatment well. Total UF 1.5L as ordered. Per Specials, hd cath to be removed in am. Will send back to room. Primary RN given report. No changes from previous assessment.

## 2017-09-26 NOTE — Progress Notes (Signed)
    [facility] has been requested to perform a transesophageal echocardiogram on Perry [name] for evaluation of MRSA bacteremia.  After careful review of history and examination, the risks and benefits of transesophageal echocardiogram have been explained including risks of esophageal damage, perforation (1:10,000 risk), bleeding, pharyngeal hematoma as well as other potential complications associated with conscious sedation including aspiration, arrhythmia, respiratory failure and death. Alternatives to treatment were discussed, questions were answered. Patient is willing to proceed.   Eula Listen, PA-C 09/26/2017 9:49 AM

## 2017-09-26 NOTE — Progress Notes (Signed)
[facility], [place] 09/26/17  Subjective:  Case was discussed in depth with vascular surgery. Plan for today is to perform dialysis and then remove PermCath as there is some.  At another catheter may not be able to be replaced easily. However repeat cultures from 09/22/17 or less for negative.  Objective:  Vital signs in last 24 hours:  Temp:  [97.5 F (36.4 C)-98.1 F (36.7 C)] 97.7 F (36.5 C) (09/10 1359) Pulse Rate:  [84-91] 84 (09/10 1415) Resp:  [19-24] 20 (09/10 1415) BP: (87-120)/(29-54) 89/32 (09/10 1415) SpO2:  [99 %-100 %] 99 % (09/10 1415) Weight:  [80.7 kg-80.8 kg] 80.8 kg (09/10 1359)  Weight change:  Filed Weights   09/22/17 1215 09/26/17 0636 09/26/17 1359  Weight: 81.4 kg 80.7 kg 80.8 kg    Intake/Output:    Intake/Output Summary (Last 24 hours) at 09/26/2017 1435 Last data filed at 09/26/2017 1135 Gross per 24 hour  Intake 450 ml  Output 0 ml  Net 450 ml     Physical Exam: General:  No acute distress, laying in the bed, chronically ill-appearing  HEENT  moist oral mucous membranes  Neck  supple  Pulm/lungs  scattered rhonchi, normal effort  CVS/Heart  S1S2 no rubs  Abdomen:   Soft, nontender, BS present  Extremities:  Left arm edema  Neurologic:  Alert, oriented, left-sided weakness  Skin:  No acute rashes  Access:  Left IJ PC       Basic Metabolic Panel:  Recent Labs  Lab 09/21/17 1754 09/22/17 1251 09/25/17 0443  NA 141  --  140  K 3.1*  --  3.3*  CL 98  --  100  CO2 28  --  28  GLUCOSE 117*  --  91  BUN 28*  --  50*  CREATININE 6.03*  --  7.43*  CALCIUM 8.1*  --  9.0  PHOS  --  3.8  --      CBC: Recent Labs  Lab 09/21/17 1754  WBC 10.0  NEUTROABS 9.2*  HGB 12.0*  HCT 37.4*  MCV 83.4  PLT 253      Lab Results  Component Value Date   HEPBSAG Negative 09/22/2017   HEPBSAB Non Reactive 07/11/2016      Microbiology:  Recent Results (from the past 240 hour(s))  Blood Culture (routine  x 2)     Status: Abnormal   Collection Time: 09/21/17  5:54 PM  Result Value Ref Range Status   Specimen Description   Final    BLOOD RIGHT HAND Performed at [facility], [address] [place], [place] [address]    Special Requests   Final    BOTTLES DRAWN AEROBIC AND ANAEROBIC Blood Culture results may not be optimal due to an excessive volume of blood received in culture bottles Performed at [facility], [address] [place], [place] [address]    Culture  Setup Time   Final    GRAM POSITIVE COCCI IN BOTH AEROBIC AND ANAEROBIC BOTTLES CRITICAL RESULT CALLED TO, READ BACK BY AND VERIFIED WITH: MATT MCBANE AT 0508 ON 09/22/17 [facility]. Performed at [facility], [address], [place], [place] [address]    Culture METHICILLIN RESISTANT STAPHYLOCOCCUS AUREUS (A)  Final   Report Status 09/24/2017 FINAL  Final   Organism ID, Bacteria METHICILLIN RESISTANT STAPHYLOCOCCUS AUREUS  Final      Susceptibility   Methicillin resistant staphylococcus aureus - MIC*    CIPROFLOXACIN >=8 RESISTANT Resistant  ERYTHROMYCIN >=8 RESISTANT Resistant     GENTAMICIN <=0.5 SENSITIVE Sensitive     OXACILLIN >=4 RESISTANT Resistant     TETRACYCLINE <=1 SENSITIVE Sensitive     VANCOMYCIN <=0.5 SENSITIVE Sensitive     TRIMETH/SULFA <=10 SENSITIVE Sensitive     CLINDAMYCIN <=0.25 SENSITIVE Sensitive     RIFAMPIN <=0.5 SENSITIVE Sensitive     Inducible Clindamycin NEGATIVE Sensitive     * METHICILLIN RESISTANT STAPHYLOCOCCUS AUREUS  Blood Culture ID Panel (Reflexed)     Status: Abnormal   Collection Time: 09/21/17  5:54 PM  Result Value Ref Range Status   Enterococcus species NOT DETECTED NOT DETECTED Final   Listeria monocytogenes NOT DETECTED NOT DETECTED Final   Staphylococcus species DETECTED (A) NOT DETECTED Final    Comment: CRITICAL RESULT CALLED TO, READ BACK BY AND VERIFIED WITH: MATT MCBANE AT 0508 ON 09/22/17 MMC.    Staphylococcus aureus DETECTED (A) NOT DETECTED  Final    Comment: Methicillin (oxacillin)-resistant Staphylococcus aureus (MRSA). MRSA is predictably resistant to beta-lactam antibiotics (except ceftaroline). Preferred therapy is vancomycin unless clinically contraindicated. Patient requires contact precautions if  hospitalized. CRITICAL RESULT CALLED TO, READ BACK BY AND VERIFIED WITH: MATT MCBANE AT 0508 ON 09/22/17 MMC.    Methicillin resistance DETECTED (A) NOT DETECTED Final    Comment: CRITICAL RESULT CALLED TO, READ BACK BY AND VERIFIED WITH: MATT MCBANE AT 0508 ON 09/22/17 MMC.    Streptococcus species NOT DETECTED NOT DETECTED Final   Streptococcus agalactiae NOT DETECTED NOT DETECTED Final   Streptococcus pneumoniae NOT DETECTED NOT DETECTED Final   Streptococcus pyogenes NOT DETECTED NOT DETECTED Final   Acinetobacter baumannii NOT DETECTED NOT DETECTED Final   Enterobacteriaceae species NOT DETECTED NOT DETECTED Final   Enterobacter cloacae complex NOT DETECTED NOT DETECTED Final   Escherichia coli NOT DETECTED NOT DETECTED Final   Klebsiella oxytoca NOT DETECTED NOT DETECTED Final   Klebsiella pneumoniae NOT DETECTED NOT DETECTED Final   Proteus species NOT DETECTED NOT DETECTED Final   Serratia marcescens NOT DETECTED NOT DETECTED Final   Haemophilus influenzae NOT DETECTED NOT DETECTED Final   Neisseria meningitidis NOT DETECTED NOT DETECTED Final   Pseudomonas aeruginosa NOT DETECTED NOT DETECTED Final   Candida albicans NOT DETECTED NOT DETECTED Final   Candida glabrata NOT DETECTED NOT DETECTED Final   Candida krusei NOT DETECTED NOT DETECTED Final   Candida parapsilosis NOT DETECTED NOT DETECTED Final   Candida tropicalis NOT DETECTED NOT DETECTED Final    Comment: Performed at [facility], [address] [place], [place] [address]  Blood Culture (routine x 2)     Status: Abnormal   Collection Time: 09/21/17  6:05 PM  Result Value Ref Range Status   Specimen Description   Final    BLOOD RIGHT  [facility] Performed at [facility], [address], [place], [place] [address]    Special Requests   Final    BOTTLES DRAWN AEROBIC AND ANAEROBIC Blood Culture adequate volume Performed at [facility], [address] [place], [place] [address]    Culture  Setup Time   Final    GRAM POSITIVE COCCI IN BOTH AEROBIC AND ANAEROBIC BOTTLES CRITICAL RESULT CALLED TO, READ BACK BY AND VERIFIED WITH: MATT MCBANE AT 0508 ON 09/22/17 MMC. Performed at [facility], [address] [place], [place] [address]    Culture (A)  Final    STAPHYLOCOCCUS AUREUS SUSCEPTIBILITIES PERFORMED ON PREVIOUS CULTURE WITHIN THE LAST 5 DAYS. Performed at [facility], [address], [place], [place] [address]    Report Status 09/24/2017 FINAL  Final  MRSA PCR Screening     Status: Abnormal   Collection Time: 09/22/17 12:16 AM  Result Value Ref Range Status   MRSA by PCR POSITIVE (A) NEGATIVE Final    Comment:        The GeneXpert MRSA Assay (FDA approved for NASAL specimens only), is one component of a comprehensive MRSA colonization surveillance program. It is not intended to diagnose MRSA infection nor to guide or monitor treatment for MRSA infections. CRITICAL RESULT CALLED TO, READ BACK BY AND VERIFIED WITH: C/MARCELLA TURNER @0212  09/22/17 [facility] Performed at [facility], [address] [place], [place] [address]   CULTURE, BLOOD (ROUTINE X 2) w Reflex to ID Panel     Status: None (Preliminary result)   Collection Time: 09/22/17  9:03 AM  Result Value Ref Range Status   Specimen Description BLOOD RIGHT ANTECUBITAL  Final   Special Requests   Final    BOTTLES DRAWN AEROBIC AND ANAEROBIC Blood Culture adequate volume   Culture   Final    NO GROWTH 4 DAYS Performed at [facility], [address] [place], [place] [address]    Report Status PENDING  Incomplete  CULTURE, BLOOD (ROUTINE X 2) w Reflex to ID Panel     Status: None (Preliminary result)    Collection Time: 09/22/17  9:03 AM  Result Value Ref Range Status   Specimen Description BLOOD BLOOD LEFT HAND  Final   Special Requests   Final    BOTTLES DRAWN AEROBIC AND ANAEROBIC Blood Culture adequate volume   Culture   Final    NO GROWTH 4 DAYS Performed at [facility], [address], [place], [place] [address]    Report Status PENDING  Incomplete    Coagulation Studies: Recent Labs    09/24/17 0737 09/25/17 0443 09/26/17 0526  LABPROT 57.0* 69.4* 28.0*  INR 6.57* 8.45* 2.64    Urinalysis: No results for input(s): COLORURINE, LABSPEC, PHURINE, GLUCOSEU, HGBUR, BILIRUBINUR, KETONESUR, PROTEINUR, UROBILINOGEN, NITRITE, LEUKOCYTESUR in the last 72 hours.  Invalid input(s): APPERANCEUR    Imaging: No results found.   Medications:   . small volume/piggyback builder 200 mL/hr at 09/26/17 1314  . vancomycin     . aspirin  81 mg Oral Daily  . escitalopram  20 mg Oral QHS  . feeding supplement (NEPRO CARB STEADY)  237 mL Oral BID BM  . mouth rinse  15 mL Mouth Rinse BID  . midodrine  15 mg Oral TID AC  . multivitamin  1 tablet Oral QHS  . multivitamin with minerals  1 tablet Oral Daily  . multivitamin-lutein  1 capsule Oral Daily  . mupirocin ointment  1 application Nasal BID  . pantoprazole  40 mg Oral QAC breakfast  . senna-docusate  2 tablet Oral BID  . vitamin A  10,000 Units Oral Daily  . [START ON 09/28/2017] vitamin C  250 mg Oral BID  . Vitamin D (Ergocalciferol)  50,000 Units Oral Q7 days  . warfarin  3 mg Oral q1800  . Warfarin - Pharmacist Dosing Inpatient   Does not apply q1800   acetaminophen **OR** acetaminophen, albuterol, alprazolam, morphine injection, ondansetron **OR** ondansetron (ZOFRAN) IV, polyethylene glycol, senna-docusate  Assessment/ Plan:  47 y.o. African-American male End-stage renal disease on hemodialysis MWF followed by [employer], chronic back pain, GERD, hypertension, seizure disorder, obstructive sleep apnea,  anemia chronic kidney  disease, secondary hyperparathyroidism, anxiety   [facility]/ [address] [facility]/ EDW 86 kg/ MWF/CVC  Patient admitted for cough, fever.  1.  End-stage renal disease - case discussed in depth with vascular surgery yesterday.  They fear that he may have some difficulty with replacement of PermCath.  However given her MRSA bacteremia removal of the catheter has been recommended.  We plan to perform dialysis today and we will plan for catheter removal thereafter via assistance from vascular surgery.  2.  Fever, cough.  Chest x-ray shows pulmonary edema and bilateral pleural effusions -Blood cultures positive for gram-positive cocci.  ID panel shows MRSA -Differential includes pneumonia versus dialysis catheter infection - ID recommends cathter removal. - PermCath to be removed today.  3.  Anemia of chronic kidney disease - ContinueTo monitor CBC.  4.  Secondary hyperparathyroidism -recheck serum phosphorus today is most recent serum phosphorus was 3.8.  Binders and Sensipar on hold.  5. Coagulopathy INR down to 2.64 today.    LOS: 5 Perry [name] 9/10/20192:35 PM  [facility] [place], [place] [phone]  Note: This note was prepared with Dragon dictation. Any transcription errors are unintentional

## 2017-09-26 NOTE — Progress Notes (Signed)
HD initaited via L Chest HD cath per Dr. Cherylann Ratel. Patient is for cath removal post dialysis. No heparin. UF goal 1.5L. Placed on 4k bath pending labs drawn today. Oriented x2, knows name and knows he is at [facility]. Stable for dialysis.

## 2017-09-27 ENCOUNTER — Encounter
Admission: EM | Disposition: A | Discharge status: Other Acute Inpatient Hospital | Payer: Self-pay | Source: Home / Self Care | Attending: Internal Medicine

## 2017-09-27 ENCOUNTER — Inpatient Hospital Stay (HOSPITAL_COMMUNITY)
Admission: AD | Admit: 2017-09-27 | Discharge: 2017-10-09 | DRG: 871 | Disposition: A | Discharge status: Home Health | Payer: Medicare Other | Source: Other Acute Inpatient Hospital | Attending: Internal Medicine | Admitting: Internal Medicine

## 2017-09-27 ENCOUNTER — Encounter: Payer: Self-pay | Admitting: Vascular Surgery

## 2017-09-27 ENCOUNTER — Inpatient Hospital Stay (HOSPITAL_COMMUNITY)
Admit: 2017-09-27 | Discharge: 2017-09-27 | Disposition: A | Discharge status: Home / Self Care | Payer: Medicare Other | Attending: Physician Assistant | Admitting: Physician Assistant

## 2017-09-27 DIAGNOSIS — Z9884 Bariatric surgery status: Secondary | ICD-10-CM | POA: Diagnosis not present

## 2017-09-27 DIAGNOSIS — G8929 Other chronic pain: Secondary | ICD-10-CM | POA: Diagnosis present

## 2017-09-27 DIAGNOSIS — K219 Gastro-esophageal reflux disease without esophagitis: Secondary | ICD-10-CM | POA: Diagnosis present

## 2017-09-27 DIAGNOSIS — Z7189 Other specified counseling: Secondary | ICD-10-CM

## 2017-09-27 DIAGNOSIS — R532 Functional quadriplegia: Secondary | ICD-10-CM | POA: Diagnosis present

## 2017-09-27 DIAGNOSIS — I69351 Hemiplegia and hemiparesis following cerebral infarction affecting right dominant side: Secondary | ICD-10-CM | POA: Diagnosis not present

## 2017-09-27 DIAGNOSIS — B9562 Methicillin resistant Staphylococcus aureus infection as the cause of diseases classified elsewhere: Secondary | ICD-10-CM | POA: Diagnosis not present

## 2017-09-27 DIAGNOSIS — E669 Obesity, unspecified: Secondary | ICD-10-CM | POA: Diagnosis present

## 2017-09-27 DIAGNOSIS — Z885 Allergy status to narcotic agent status: Secondary | ICD-10-CM

## 2017-09-27 DIAGNOSIS — N186 End stage renal disease: Secondary | ICD-10-CM | POA: Diagnosis present

## 2017-09-27 DIAGNOSIS — Z8673 Personal history of transient ischemic attack (TIA), and cerebral infarction without residual deficits: Secondary | ICD-10-CM | POA: Diagnosis not present

## 2017-09-27 DIAGNOSIS — I509 Heart failure, unspecified: Secondary | ICD-10-CM | POA: Diagnosis present

## 2017-09-27 DIAGNOSIS — I953 Hypotension of hemodialysis: Secondary | ICD-10-CM | POA: Diagnosis not present

## 2017-09-27 DIAGNOSIS — Z22322 Carrier or suspected carrier of Methicillin resistant Staphylococcus aureus: Secondary | ICD-10-CM

## 2017-09-27 DIAGNOSIS — Z9119 Patient's noncompliance with other medical treatment and regimen: Secondary | ICD-10-CM

## 2017-09-27 DIAGNOSIS — Z7401 Bed confinement status: Secondary | ICD-10-CM

## 2017-09-27 DIAGNOSIS — I080 Rheumatic disorders of both mitral and aortic valves: Secondary | ICD-10-CM | POA: Diagnosis not present

## 2017-09-27 DIAGNOSIS — A4102 Sepsis due to Methicillin resistant Staphylococcus aureus: Secondary | ICD-10-CM | POA: Diagnosis not present

## 2017-09-27 DIAGNOSIS — I132 Hypertensive heart and chronic kidney disease with heart failure and with stage 5 chronic kidney disease, or end stage renal disease: Secondary | ICD-10-CM | POA: Diagnosis present

## 2017-09-27 DIAGNOSIS — T8249XA Other complication of vascular dialysis catheter, initial encounter: Secondary | ICD-10-CM | POA: Diagnosis not present

## 2017-09-27 DIAGNOSIS — Z7982 Long term (current) use of aspirin: Secondary | ICD-10-CM

## 2017-09-27 DIAGNOSIS — Z992 Dependence on renal dialysis: Secondary | ICD-10-CM

## 2017-09-27 DIAGNOSIS — I9589 Other hypotension: Secondary | ICD-10-CM | POA: Diagnosis present

## 2017-09-27 DIAGNOSIS — R441 Visual hallucinations: Secondary | ICD-10-CM | POA: Diagnosis not present

## 2017-09-27 DIAGNOSIS — Z6826 Body mass index (BMI) 26.0-26.9, adult: Secondary | ICD-10-CM

## 2017-09-27 DIAGNOSIS — T829XXA Unspecified complication of cardiac and vascular prosthetic device, implant and graft, initial encounter: Secondary | ICD-10-CM

## 2017-09-27 DIAGNOSIS — G4733 Obstructive sleep apnea (adult) (pediatric): Secondary | ICD-10-CM | POA: Diagnosis present

## 2017-09-27 DIAGNOSIS — Z8249 Family history of ischemic heart disease and other diseases of the circulatory system: Secondary | ICD-10-CM

## 2017-09-27 DIAGNOSIS — I351 Nonrheumatic aortic (valve) insufficiency: Secondary | ICD-10-CM

## 2017-09-27 DIAGNOSIS — R509 Fever, unspecified: Secondary | ICD-10-CM | POA: Diagnosis not present

## 2017-09-27 DIAGNOSIS — A419 Sepsis, unspecified organism: Secondary | ICD-10-CM

## 2017-09-27 DIAGNOSIS — J9601 Acute respiratory failure with hypoxia: Secondary | ICD-10-CM | POA: Diagnosis not present

## 2017-09-27 DIAGNOSIS — I330 Acute and subacute infective endocarditis: Secondary | ICD-10-CM | POA: Diagnosis present

## 2017-09-27 DIAGNOSIS — Z87891 Personal history of nicotine dependence: Secondary | ICD-10-CM

## 2017-09-27 DIAGNOSIS — E440 Moderate protein-calorie malnutrition: Secondary | ICD-10-CM | POA: Diagnosis present

## 2017-09-27 DIAGNOSIS — R0602 Shortness of breath: Secondary | ICD-10-CM | POA: Diagnosis not present

## 2017-09-27 DIAGNOSIS — I69354 Hemiplegia and hemiparesis following cerebral infarction affecting left non-dominant side: Secondary | ICD-10-CM | POA: Diagnosis not present

## 2017-09-27 DIAGNOSIS — M549 Dorsalgia, unspecified: Secondary | ICD-10-CM | POA: Diagnosis present

## 2017-09-27 DIAGNOSIS — D6861 Antiphospholipid syndrome: Secondary | ICD-10-CM | POA: Diagnosis present

## 2017-09-27 DIAGNOSIS — I339 Acute and subacute endocarditis, unspecified: Secondary | ICD-10-CM

## 2017-09-27 DIAGNOSIS — Z23 Encounter for immunization: Secondary | ICD-10-CM | POA: Diagnosis present

## 2017-09-27 DIAGNOSIS — F419 Anxiety disorder, unspecified: Secondary | ICD-10-CM | POA: Diagnosis present

## 2017-09-27 DIAGNOSIS — Z8619 Personal history of other infectious and parasitic diseases: Secondary | ICD-10-CM | POA: Diagnosis not present

## 2017-09-27 DIAGNOSIS — Z888 Allergy status to other drugs, medicaments and biological substances status: Secondary | ICD-10-CM | POA: Diagnosis not present

## 2017-09-27 DIAGNOSIS — I510 Cardiac septal defect, acquired: Secondary | ICD-10-CM | POA: Diagnosis present

## 2017-09-27 DIAGNOSIS — Z66 Do not resuscitate: Secondary | ICD-10-CM | POA: Diagnosis present

## 2017-09-27 DIAGNOSIS — R7881 Bacteremia: Principal | ICD-10-CM

## 2017-09-27 DIAGNOSIS — I340 Nonrheumatic mitral (valve) insufficiency: Secondary | ICD-10-CM

## 2017-09-27 DIAGNOSIS — G40909 Epilepsy, unspecified, not intractable, without status epilepticus: Secondary | ICD-10-CM | POA: Diagnosis present

## 2017-09-27 DIAGNOSIS — D631 Anemia in chronic kidney disease: Secondary | ICD-10-CM | POA: Diagnosis present

## 2017-09-27 DIAGNOSIS — N2581 Secondary hyperparathyroidism of renal origin: Secondary | ICD-10-CM | POA: Diagnosis present

## 2017-09-27 DIAGNOSIS — R011 Cardiac murmur, unspecified: Secondary | ICD-10-CM | POA: Diagnosis not present

## 2017-09-27 DIAGNOSIS — D6859 Other primary thrombophilia: Secondary | ICD-10-CM | POA: Diagnosis not present

## 2017-09-27 DIAGNOSIS — B957 Other staphylococcus as the cause of diseases classified elsewhere: Secondary | ICD-10-CM | POA: Diagnosis present

## 2017-09-27 DIAGNOSIS — I120 Hypertensive chronic kidney disease with stage 5 chronic kidney disease or end stage renal disease: Secondary | ICD-10-CM | POA: Diagnosis not present

## 2017-09-27 DIAGNOSIS — Z515 Encounter for palliative care: Secondary | ICD-10-CM | POA: Diagnosis not present

## 2017-09-27 DIAGNOSIS — Z7901 Long term (current) use of anticoagulants: Secondary | ICD-10-CM

## 2017-09-27 DIAGNOSIS — Z79899 Other long term (current) drug therapy: Secondary | ICD-10-CM

## 2017-09-27 HISTORY — PX: TEE WITHOUT CARDIOVERSION: SHX5443

## 2017-09-27 HISTORY — PX: DIALYSIS/PERMA CATHETER REMOVAL: CATH118289

## 2017-09-27 LAB — CULTURE, BLOOD (ROUTINE X 2)
CULTURE: NO GROWTH
Culture: NO GROWTH
SPECIAL REQUESTS: ADEQUATE
SPECIAL REQUESTS: ADEQUATE

## 2017-09-27 LAB — PROTIME-INR
INR: 1.54
PROTHROMBIN TIME: 18.4 s — AB (ref 11.4–15.2)

## 2017-09-27 LAB — VANCOMYCIN, RANDOM: VANCOMYCIN RM: 39

## 2017-09-27 SURGERY — DIALYSIS/PERMA CATHETER REMOVAL
Anesthesia: LOCAL

## 2017-09-27 SURGERY — ECHOCARDIOGRAM, TRANSESOPHAGEAL
Anesthesia: Moderate Sedation

## 2017-09-27 MED ORDER — SEVELAMER CARBONATE 2.4 G PO PACK
2.4000 g | PACK | Freq: Three times a day (TID) | ORAL | Status: DC
  Filled 2017-09-27 (×3): qty 1

## 2017-09-27 MED ORDER — VITAMIN C 500 MG PO TABS
250.0000 mg | ORAL_TABLET | Freq: Two times a day (BID) | ORAL | Status: DC
  Administered 2017-09-28 – 2017-10-09 (×20): 250 mg via ORAL
  Filled 2017-09-27 (×21): qty 1

## 2017-09-27 MED ORDER — NOREPINEPHRINE 4 MG/250ML-% IV SOLN
0.0000 ug/min | INTRAVENOUS | Status: DC
  Administered 2017-09-27: 6.667 ug/min via INTRAVENOUS
  Filled 2017-09-27: qty 250

## 2017-09-27 MED ORDER — RENA-VITE PO TABS
1.0000 | ORAL_TABLET | Freq: Every day | ORAL | Status: DC
  Administered 2017-09-27 – 2017-10-09 (×12): 1 via ORAL
  Filled 2017-09-27 (×12): qty 1

## 2017-09-27 MED ORDER — DOPAMINE-DEXTROSE 3.2-5 MG/ML-% IV SOLN
INTRAVENOUS | Status: AC | PRN
  Administered 2017-09-27: 10 ug/kg/min via INTRAVENOUS

## 2017-09-27 MED ORDER — FENTANYL CITRATE (PF) 100 MCG/2ML IJ SOLN
INTRAMUSCULAR | Status: AC | PRN
  Administered 2017-09-27 (×3): 25 ug via INTRAVENOUS

## 2017-09-27 MED ORDER — PANTOPRAZOLE SODIUM 20 MG PO TBEC
20.0000 mg | DELAYED_RELEASE_TABLET | Freq: Every day | ORAL | Status: DC
  Administered 2017-09-27 – 2017-10-09 (×11): 20 mg via ORAL
  Filled 2017-09-27 (×11): qty 1

## 2017-09-27 MED ORDER — ASCORBIC ACID 250 MG PO TABS: 250.0000 mg | ORAL_TABLET | Freq: Two times a day (BID) | ORAL | Status: AC

## 2017-09-27 MED ORDER — ESCITALOPRAM OXALATE 20 MG PO TABS
20.0000 mg | ORAL_TABLET | Freq: Every day | ORAL | Status: DC
  Administered 2017-09-27 – 2017-10-09 (×13): 20 mg via ORAL
  Filled 2017-09-27 (×3): qty 1
  Filled 2017-09-27: qty 2
  Filled 2017-09-27 (×9): qty 1

## 2017-09-27 MED ORDER — VITAMIN D (ERGOCALCIFEROL) 1.25 MG (50000 UNIT) PO CAPS: 50000.0000 [IU] | ORAL_CAPSULE | ORAL | Status: AC

## 2017-09-27 MED ORDER — FENTANYL CITRATE (PF) 100 MCG/2ML IJ SOLN
INTRAMUSCULAR | Status: AC
  Filled 2017-09-27: qty 2

## 2017-09-27 MED ORDER — WARFARIN SODIUM 3 MG PO TABS: 3.0000 mg | ORAL_TABLET | Freq: Every day | ORAL | Status: DC

## 2017-09-27 MED ORDER — ASPIRIN 81 MG PO CHEW
81.0000 mg | CHEWABLE_TABLET | Freq: Every day | ORAL | Status: DC
  Administered 2017-09-27 – 2017-10-09 (×11): 81 mg via ORAL
  Filled 2017-09-27 (×11): qty 1

## 2017-09-27 MED ORDER — SODIUM CHLORIDE 0.9 % IV SOLN: INTRAVENOUS | Status: DC

## 2017-09-27 MED ORDER — BUTAMBEN-TETRACAINE-BENZOCAINE 2-2-14 % EX AERO
INHALATION_SPRAY | CUTANEOUS | Status: AC
  Administered 2017-09-27: 11:00:00
  Filled 2017-09-27: qty 5

## 2017-09-27 MED ORDER — ADULT MULTIVITAMIN W/MINERALS CH: 1.0000 | ORAL_TABLET | Freq: Every day | ORAL | Status: AC

## 2017-09-27 MED ORDER — LIDOCAINE VISCOUS HCL 2 % MT SOLN
OROMUCOSAL | Status: AC
  Administered 2017-09-27: 15 mL
  Filled 2017-09-27: qty 15

## 2017-09-27 MED ORDER — VITAMIN A 10000 UNITS PO CAPS: 10000.0000 [IU] | ORAL_CAPSULE | Freq: Every day | ORAL | Status: AC

## 2017-09-27 MED ORDER — NOREPINEPHRINE 4 MG/250ML-% IV SOLN
INTRAVENOUS | Status: AC | PRN
  Administered 2017-09-27: 6.667 ug/min via INTRAVENOUS

## 2017-09-27 MED ORDER — MIDAZOLAM HCL 5 MG/5ML IJ SOLN
INTRAMUSCULAR | Status: AC
  Filled 2017-09-27: qty 5

## 2017-09-27 MED ORDER — RESOURCE THICKENUP CLEAR PO POWD
ORAL | Status: DC | PRN
  Filled 2017-09-27 (×3): qty 125

## 2017-09-27 MED ORDER — MIDODRINE HCL 5 MG PO TABS
15.0000 mg | ORAL_TABLET | Freq: Three times a day (TID) | ORAL | Status: DC
  Administered 2017-09-28 – 2017-10-09 (×32): 15 mg via ORAL
  Filled 2017-09-27 (×29): qty 3

## 2017-09-27 MED ORDER — HEPARIN SODIUM (PORCINE) 5000 UNIT/ML IJ SOLN
5000.0000 [IU] | Freq: Three times a day (TID) | INTRAMUSCULAR | Status: DC
  Filled 2017-09-27: qty 1

## 2017-09-27 MED ORDER — SODIUM CHLORIDE FLUSH 0.9 % IV SOLN
INTRAVENOUS | Status: AC
  Filled 2017-09-27: qty 10

## 2017-09-27 MED ORDER — CINACALCET HCL 30 MG PO TABS
60.0000 mg | ORAL_TABLET | Freq: Every day | ORAL | Status: DC
  Administered 2017-09-27 – 2017-10-09 (×12): 60 mg via ORAL
  Filled 2017-09-27 (×12): qty 2

## 2017-09-27 MED ORDER — OCUVITE-LUTEIN PO CAPS: 1.0000 | ORAL_CAPSULE | Freq: Every day | ORAL | 0 refills | Status: AC

## 2017-09-27 MED ORDER — DOPAMINE-DEXTROSE 3.2-5 MG/ML-% IV SOLN
INTRAVENOUS | Status: AC
  Filled 2017-09-27: qty 250

## 2017-09-27 MED ORDER — OCUVITE-LUTEIN PO CAPS
1.0000 | ORAL_CAPSULE | Freq: Every day | ORAL | Status: DC
  Filled 2017-09-27: qty 1

## 2017-09-27 MED ORDER — VITAMIN D (ERGOCALCIFEROL) 1.25 MG (50000 UNIT) PO CAPS
50000.0000 [IU] | ORAL_CAPSULE | ORAL | Status: DC
  Administered 2017-09-29 – 2017-10-07 (×2): 50000 [IU] via ORAL
  Filled 2017-09-27 (×2): qty 1

## 2017-09-27 MED ORDER — MIDAZOLAM HCL 2 MG/2ML IJ SOLN
INTRAMUSCULAR | Status: AC | PRN
  Administered 2017-09-27 (×3): 1 mg via INTRAVENOUS

## 2017-09-27 MED ORDER — VITAMIN A 10000 UNITS PO CAPS
10000.0000 [IU] | ORAL_CAPSULE | Freq: Every day | ORAL | Status: DC
  Administered 2017-10-01 – 2017-10-03 (×3): 10000 [IU] via ORAL
  Filled 2017-09-27 (×9): qty 1

## 2017-09-27 MED ORDER — NOREPINEPHRINE BITARTRATE 1 MG/ML IV SOLN
INTRAVENOUS | Status: AC
  Filled 2017-09-27: qty 4

## 2017-09-27 SURGICAL SUPPLY — 2 items
FORCEPS HALSTEAD CVD 5IN STRL (INSTRUMENTS) ×2 IMPLANT
TRAY LACERAT/PLASTIC (MISCELLANEOUS) ×2 IMPLANT
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
## 2017-09-27 NOTE — Progress Notes (Signed)
Perry [name] is a 47 y.o. male with h/o ESRD, on dialysis, HTN,CVA, MRSA bacteremia due to HD catheter in April 2019 and was treated with antibiotics and replacement of catheter, gastric bypass  Is admitted with fever of 1 day duration.  Pt says he developed fever 1 day ago.  He also has a cough but not bringing up any sputum.  He lives at home with his mom and is usually in bed as his mom is worried he may fall because of stroke.  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 In April 2019 he was at [facility] for sepsis and had MRSA bacteremia  and catheter was changed and during that admission he had acute rt MCA stroke TEE was neg and he was treated with IV vanco for 4 weeks . Antiphospholipid antibody was positive whe he was worked up for coag abnormalities  Back from TEE  OBJECTIVE:  Patient Vitals for the past 24 hrs:  BP Temp Temp src Pulse Resp SpO2 Weight  09/27/17 1445 (!) 116/26 (!) 97.3 F (36.3 C) Oral 88 18 100 % -  09/27/17 1335 (!) 93/28 - - 88 - - -  09/27/17 1305 (!) 105/29 - - 93 13 96 % -  09/27/17 1300 (!) 99/27 - - 95 (!) 21 94 % -  09/27/17 1255 (!) 105/25 - - 94 (!) 28 98 % -  09/27/17 1250 98/63 - - (!) 101 16 100 % -  09/27/17 1245 (!) 96/49 - - 93 (!) 22 98 % -  09/27/17 1240 (!) 101/24 - - 92 (!) 23 98 % -  09/27/17 1235 (!) 83/71 - - 93 (!) 22 98 % -  09/27/17 1230 (!) 107/39 - - 92 (!) 22 97 % -  09/27/17 1225 (!) 96/30 - - 89 (!) 22 98 % -  09/27/17 1220 (!) 102/31 - - 91 (!) 23 98 % -  09/27/17 1215 (!) 100/29 - - 92 (!) 23 97 % -  09/27/17 1210 (!) 107/32 - - 93 20 99 % -  09/27/17 1205 - - - 99 18 98 % -  09/27/17 1205 (!) 106/28 - - - - 98 % -  09/27/17 1200 (!) 98/32 - - 94 (!) 23 98 % -  09/27/17 1155 (!) 101/28 - - 96 (!) 23 97 % -  09/27/17 1150 (!) 102/31 - - 96 - 96 % -  09/27/17 1145 (!) 99/35 - - 96 - 95 % -  09/27/17 1139 (!) 100/27 - - 97 - 94 % -  09/27/17 1137 (!) 119/30 - - (!) 101 - 95 %  -  09/27/17 1135 (!) 119/30 - - (!) 102 - 95 % -  09/27/17 1133 (!) 108/34 - - (!) 106 - 94 % -  09/27/17 1131 (!) 109/36 - - (!) 107 - 93 % -  09/27/17 1127 - - - 95 - 95 % -  09/27/17 1125 (!) 100/29 - - 93 - 95 % -  09/27/17 1120 (!) 94/28 - - 94 - 99 % -  09/27/17 1117 (!) 104/25 - - 94 - 100 % -  09/27/17 1112 (!) 95/29 - - - - - -  09/27/17 1110 (!) 133/111 - - - - - -  09/27/17 1102 (!) 76/57 - - 96 16 99 % -  09/27/17 0925 - (!) 97.5 F (36.4 C) Oral 89 (!) 25 98 % -  09/27/17 0829 (!) 100/28 98.5 F (36.9 C) Oral 88 18 98 % -  09/27/17 0645 (!) 90/25 - - 84 - - -  09/27/17 0545 (!) 94/26 - - 82 - - -  09/27/17 0533 (!) 102/34 98.1 F (36.7 C) Oral 82 19 100 % -  09/26/17 1938 (!) 89/23 (!) 97.4 F (36.3 C) Oral 81 20 98 % -  09/26/17 1900 (!) 91/24 - - 82 - - -  09/26/17 1805 (!) 97/28 97.6 F (36.4 C) Oral 79 18 100 % -  09/26/17 1742 (!) 90/42 - - 80 15 - -  09/26/17 1740 (!) 83/31 97.6 F (36.4 C) Oral 85 16 94 % 79.1 kg  09/26/17 1730 (!) 92/28 - - 84 14 99 % -  09/26/17 1715 (!) 96/31 - - 82 14 98 % -  09/26/17 1700 (!) 95/28 - - 89 15 95 % -  09/26/17 1645 (!) 93/29 - - 86 15 95 % -  09/26/17 1630 (!) 89/31 - - 76 16 100 % -  09/26/17 1615 (!) 90/33 - - 80 15 99 % -  09/26/17 1600 (!) 89/27 - - 77 16 100 % -  09/26/17 1545 (!) 88/30 - - 80 15 100 % -  09/26/17 1530 (!) 87/32 - - 79 20 100 % -  09/26/17 1515 (!) 89/29 - - 80 19 99 % -  09/26/17 1500 (!) 91/27 - - 80 20 100 % -   Physical Exam  confused Neck: Normal range of motion.  CVS s1s2 2/6 systolic murmur  Left sided tunneled catheter  RS b/l air entry decreased both bases  abd soft  Cns Left hemiplegia  Skin over the face -hyperpigmented    Lab Results  CBC  CBC Latest Ref Rng & Units 09/26/2017 09/21/2017 08/21/2017  WBC 3.8 - 10.6 K/uL 10.6 10.0 4.4  Hemoglobin 13.0 - 18.0 g/dL 11.5(L) 12.0(L) 12.7(L)  Hematocrit 40.0 - 52.0 % 36.9(L) 37.4(L) 40.9  Platelets 150 - 440 K/uL 397 253 316   CMP  Latest Ref Rng & Units 09/26/2017 09/25/2017 09/21/2017  Glucose 70 - 99 mg/dL 409(W) 91 119(J)  BUN 6 - 20 mg/dL 47(W) 29(F) 62(Z)  Creatinine 0.61 - 1.24 mg/dL 3.08(M) 5.78(I) 6.96(E)  Sodium 135 - 145 mmol/L 143 140 141  Potassium 3.5 - 5.1 mmol/L 3.8 3.3(L) 3.1(L)  Chloride 98 - 111 mmol/L 98 100 98  CO2 22 - 32 mmol/L 27 28 28   Calcium 8.9 - 10.3 mg/dL 9.5(M) 9.0 8.4(X)  Total Protein 6.5 - 8.1 g/dL - - 6.5  Total Bilirubin 0.3 - 1.2 mg/dL - - 1.4(H)  Alkaline Phos 38 - 126 U/L - - 86  AST 15 - 41 U/L - - 27  ALT 0 - 44 U/L - - 18   9/5 Bc- 4/4 bottles positive for MRSA vanco MIC < 0.5  MRSA nares - positive 9/6 BC- NG so far  Radiographs and labs were personally reviewed by me.  Assessment and Plan  47 y.o. male with h/o ESRD, on dialysis, HTN,CVA, MRSA bacteremia due to HD catheter in April 2019 and was treated with antibiotics and replacement of catheter, gastric bypass  Is admitted with fever of 1 day duration.  Pt says he developed fever 1 day ago.  He also has a cough but not bringing up any sputum.  MRSA bacteremia causing fever.  Likely source dialysis catheter. Had similar problem in April 2019 and catheter was replaced and he was treated  with 4 weeks of vanco ( TEE was neg then)  Underwent cath removal today and also had TEE which showed "Large vegetation  at the base of the anterior mitral leaflet, appears to have septal perforation tracking up to aortic valve, suspected abscess,  Severe aortic valve regurgitation with shunt into left atrium, severe  MR Grossly aortic valve and mitral valve appear to be intact, unable to exclude valve leaflet perforation". On vanco - will need for atleast 6 weeks ( may be more) , depending on his surgery and the culture of the valve also Awaiting transfer to [facility] for surgery  Cough and SOB- likely due to CHF  ESRD on hemodialysis  CVA -left hemiparesis   Discussed the management with his nurse. Will inform [facility] for follow up once he  is transferred to [facility]
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
## 2017-09-27 NOTE — Op Note (Signed)
  OPERATIVE NOTE   PROCEDURE: 1. Removal of a left IJ tunneled dialysis catheter  PRE-OPERATIVE DIAGNOSIS: Complication of dialysis catheter, End stage renal disease  POST-OPERATIVE DIAGNOSIS: Same  SURGEON: Levora Dredge, M.D.  ANESTHESIA: Local anesthetic with 1% lidocaine with epinephrine   ESTIMATED BLOOD LOSS: Minimal   FINDING(S): 1. Catheter intact   SPECIMEN(S):  Catheter  INDICATIONS:   Perry [name] is a 47 y.o. male who presents with MRSA bacteremia and likely catheter related infection with sepsis.  The patient has undergone placement of an extremity access which is working and this has been successfully cannulated without difficulty.  therefore is undergoing removal of his tunneled catheter which is no longer needed to avoid septic complications.   DESCRIPTION: After obtaining full informed written consent, the patient was positioned supine. The left IJ tunneled with catheter and surrounding area is prepped and draped in a sterile fashion. The cuff was localized by palpation and noted to be less than 3 cm from the exit site. After appropriate timeout is called, 1% lidocaine with epinephrine is infiltrated into the surrounding tissues around the cuff. Small transverse incision is created at the exit site with an 11 blade scalpel and the dissection was carried up along the catheter to expose the cuff of the tunneled catheter.  The catheter cuff is then freed from the surrounding attachments and adhesions. Once the catheter has been freed circumferentially it is removed in 1 piece. Light pressure was held at the base of the neck.   Antibiotic ointment and a sterile dressing is applied to the exit site. Patient tolerated procedure well and there were no complications.  COMPLICATIONS: None  CONDITION: Unchanged  Levora Dredge, M.D. [facility] Office: [phone]  09/27/2017,9:10 AM

## 2017-09-27 NOTE — Progress Notes (Signed)
*  PRELIMINARY RESULTS* Echocardiogram Echocardiogram Transesophageal has been performed.  Cristela Blue 09/27/2017, 12:10 PM

## 2017-09-27 NOTE — Progress Notes (Signed)
SLP Cancellation Note  Patient Details Name: Perry [name] MRN: [id number] DOB: 1970-06-27   Cancelled treatment:       Reason Eval/Treat Not Completed: Patient at procedure or test/unavailable(chart reviewed; pt is NPO for procedure). Will attempt f/u w/ trials to upgrade liquid in diet later this PM if awake/apppropriate/safe or in the morning.    Jerilynn Som, MS, CCC-SLP Watson,Katherine 09/27/2017, 1:23 PM

## 2017-09-27 NOTE — Progress Notes (Signed)
On Phone with MD Shaune Pollack.  Explained patients situation, that pt was on the floor on 2C, was not on Levophed, BP low but stable according to [employer].  Qing gave verbal order to remove transfer to ICU order.

## 2017-09-27 NOTE — Progress Notes (Signed)
Bedside RN reports mother requests MD discussion/consent for TEE since pt has confusion at this time. Message given to Dr Mariah Milling and Dr Imogene Burn as well as mother contact information. Awaiting consent, notified bedside RN that consent for TEE need to be obtained

## 2017-09-27 NOTE — Progress Notes (Addendum)
Pharmacy Antibiotic Note  Perry [name] is a 47 y.o. male admitted on 09/27/2017 with bacteremia.  Pharmacy has been consulted for vancomycin dosing.  Presenting from [facility]. Known HD patient with hx of bacteremia in April (underwent catheter exchange/treatment with vanc). Presented with SOB and fevers - BCx grew MRSA. TEE noting large vegetation at base of anterior mitral leaflet with suspected abscess. Received loading dose of vancomycin 1500 mg on 9/5 and maintenance dose of 1 g on 9/6 + 9/10.   Vancomycin random was 39 today.    Plan: Hold vancomycin maintenance dosing of 1000 mg IV with HD Will order vancomycin random level prior to next HD session to see if can resume dosing Monitor cx results, clinical pic, CT surgery plans, and HD schedule  Height: 5\' 6"  (167.6 cm) Weight: 163 lb 2.3 oz (74 kg) IBW/kg (Calculated) : 63.8  Temp (24hrs), Avg:97.8 F (36.6 C), Min:97.3 F (36.3 C), Max:98.5 F (36.9 C)  Recent Labs  Lab 09/21/17 1754 09/25/17 0443 09/26/17 1426 09/27/17 0517  WBC 10.0  --  10.6  --   CREATININE 6.03* 7.43* 9.26*  --   LATICACIDVEN 1.5  --   --   --   VANCORANDOM  --   --   --  39    Estimated Creatinine Clearance: 9 mL/min (A) (by C-G formula based on SCr of 9.26 mg/dL (H)).    Allergies  Allergen Reactions  . Depakote [Divalproex Sodium] Other (See Comments)    Hallucinations   . Hydromorphone Other (See Comments)    Antimicrobials this admission: Vancomycin 9/11 >>   Dose adjustments this admission:  VR 9/11: 39 >> hold maintenance dosing  Microbiology results: 9/5 BCx: MRSA 9/6 Bcx: NGTD 9/6 MRSA PCR: pos  Thank you for allowing pharmacy to be a part of this patient's care.  Girard Cooter, PharmD Clinical Pharmacist  Pager: [phone] Phone: [phone] 09/27/2017 8:44 PM

## 2017-09-27 NOTE — Procedures (Addendum)
Transesophageal Echocardiogram :  Indication: sepsis, MRSA, fever, endocarditis Requesting/ordering  physician: Chen/Ravishankar  Procedure: Benzocaine spray x2 and 2 mls x 2 of viscous lidocaine were given orally to provide local anesthesia to the oropharynx. The patient was positioned supine on the left side, bite block provided. The patient was moderately sedated with the doses of versed and fentanyl as detailed below.  Using digital technique an omniplane probe was advanced into the distal esophagus without incident.   Moderate sedation: 1. Sedation used:  Versed: 3 mg, Fentanyl: 75 ug 2. Time administered:  11 Am   Time when patient started recovery:12:pm 3. 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  Moderately dilated left atrium Mild TR  In brief, transgastric imaging revealed normal LV function with no RWMAs and no mural apical thrombus.  .  Estimated ejection fraction was 55%.  Right sided cardiac chambers were normal with no evidence of pulmonary hypertension.  Bubble study was negative for atrial septal shunt 2D and color flow confirmed no PFO  The LA was well visualized in orthogonal views.  There was no spontaneous contrast and no thrombus in the LA and LA appendage    Moderate aortic atherosclerosis diffusely   Total encounter time more than 90 minutes  Greater than 50% was spent in counseling and coordination of care with the patient    Julien Nordmann 09/27/2017 12:20 PM

## 2017-09-27 NOTE — Progress Notes (Signed)
I have called the patients mother to discuss TEE procedure. Specials reports that she was unaware of why the procedure was needed and had not discussed with medicine service or ID team. Phone went to message. Message was left to call specials or 2C. Procedure on hold  Signed, Dossie Arbour, MD, Ph.D [facility]

## 2017-09-27 NOTE — H&P (Signed)
Perry [name]  UXL:[id number] DOB: [birth year] DOA: 09/27/2017 PCP: [facility]    LOS: 0 days   Reason for Consult / Chief Complaint:  Bacteremia  Consulting MD and date:  Dr. Imogene Burn: [facility] hospitalist  HPI/Summary of hospital stay:  47 year old male with PMH as below, which is notable for ESRD on HD via tunneled catheter. This was unfortunately complicated by bacteremia in April of this year. This was treated with catheter exchange and prolonged course of vancomycin. TEE was negative, but course was complicated by CVA during that admission, from which he now has residual L sided hemiparesis. He is reportedly bedbound and requires fully assisted care. He then presented to [facility] 9/11 with complaints of fevers and SOB. He was admitted and started on ABX to rule out PNA, however, blood cultures grew MRSA. He was narrowed to vancomycin. HD catheter was removed. He had TEE done 9/11 demonstrating large mitral vegetation with septal perforation tracking into the aortic valve, suspected abscess. 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 He was transferred to [facility] for CVTS evaluation.   Subjective:  No complaints  Objective   Blood pressure (!) 107/32, pulse 94, temperature 97.8 F (36.6 C), temperature source Oral, resp. rate 19, height 5\' 6"  (1.676 m), weight 74 kg, SpO2 (!) 87 %.       No intake or output data in the 24 hours ending 09/27/17 2014 Filed Weights   09/27/17 1830  Weight: 74 kg    Examination: General: overweight middle aged male in NAD HENT: [place]/AT, PERRL, Poor dentition, JVD Lungs: Clear, bilateral breath sounds Cardiovascular: RRR. 4/6 SEM Abdomen: Soft, non-tender, non-distened Extremities: No acute deformity Neuro: Intermittent periods of confusion. This is purportedly his baseline since stroke.   Consults: date of consult/date signed off & final recs:   CVTS: Dr Carrson Sage 9/11  Procedures:   Significant Diagnostic Tests: TEE 9/11 >  Large vegetation noted at the base of the anterior mitral leaflet, appears to have septal perforation tracking up to aortic valve, suspected abscess,  Severe aortic valve regurgitation with shunt into left atrium, severe MR Grossly aortic valve and mitral valve appear to be intact, unable to exclude valve leaflet perforation.  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 - Continue vanocmycin per pharmacy. [facility] ID recommending at least 6 weeks. - Has been evaluated for surgery by Dr. Rumaldo Sage. Awaiting his recommendations, suspect he will be considered a poor surgical candidate even if acute infection is able to be treated effectively. - Repeat BC in AM - Follow WBC and fever curve. - Line holiday as able. Due for HD 9/12 (MWF schedule)   Chronic hypotension - Continue midodrine   OSA on CPAP - Continue CPAP  ESRD on HD (MWF) - Please consult nephrology in the morning. Ideally would like to give him a line holiday, but I understand this is likely not feasible. Catheter removed 9/10.  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 Functional quadriplegia.  - Supportive care.   Anticoagulation chronic warfarin: INR initially therapeutic, now subtherapeutic.  - Will hold for possible procedures in the morning. Restart if does not go for HD cath.   Disposition / Summary of Today's Plan 09/27/17   Continue ICU, vancomycin.  CVTS to see for vegetation/abscess. Needs nephrology consult for HD.     DVT prophylaxis: holding warfarin for possible procedure.  GI prophylaxis: home PPI Diet: Renal with fluid restriction. NPO after midnight for possible procedure.  Mobility: Bedrest Code Status: DNR Family Communication: Mom updated.   Labs   CBC: Recent Labs  Lab  09/21/17 1754 09/26/17 1426  WBC 10.0 10.6  NEUTROABS 9.2*  --   HGB 12.0* 11.5*  HCT 37.4* 36.9*  MCV 83.4 85.3  PLT 253 397   Basic Metabolic Panel: Recent Labs  Lab 09/21/17 1754 09/22/17 1251 09/25/17 0443 09/26/17 1426  NA 141  --  140 143  K 3.1*  --  3.3* 3.8  CL 98  --  100 98  CO2 28  --  28 27  GLUCOSE 117*  --  91 101*  BUN 28*  --  50* 65*  CREATININE 6.03*  --  7.43* 9.26*  CALCIUM 8.1*  --  9.0 8.8*  PHOS  --  3.8  --  3.9   GFR: Estimated Creatinine Clearance: 9 mL/min (A) (by C-G formula based on SCr of 9.26 mg/dL (H)). Recent Labs  Lab 09/21/17 1754 09/26/17 1426  WBC 10.0 10.6  LATICACIDVEN 1.5  --    Liver Function Tests: Recent Labs  Lab 09/21/17 1754 09/26/17 1426  AST 27  --   ALT 18  --   ALKPHOS 86  --   BILITOT 1.4*  --   PROT 6.5  --   ALBUMIN 2.5* 2.4*   No results for input(s): LIPASE, AMYLASE in the last 168 hours. Recent Labs  Lab 09/22/17 1315  AMMONIA 15   ABG    Component Value Date/Time   PHART 7.386 05/06/2015 1812   PCO2ART 45.3 (H) 05/06/2015 1812   PO2ART 120 (H) 05/06/2015 1812   HCO3 25.8 (H) 05/06/2015 1812   ACIDBASEDEF 0.5 05/06/2015 1204   O2SAT 97.8 05/06/2015 1812    Coagulation Profile: Recent Labs  Lab 09/23/17 0551 09/24/17 0737 09/25/17 0443 09/26/17 0526 09/27/17 0517  INR 6.67* 6.57* 8.45* 2.64 1.54   Cardiac Enzymes: No results for input(s): CKTOTAL, CKMB, CKMBINDEX, TROPONINI in the last 168 hours. HbA1C: No results found for: HGBA1C CBG: No results for input(s): GLUCAP in the last 168 hours.   Review of Systems:   Unable: poor historian.   Past medical history  He,  has a past medical history of Acute meniscal tear of left knee, Acute meniscal tear of right knee, Anxiety, Chronic back pain, Dialysis patient [facility]), Fracture of transverse process of thoracic vertebra (HCC), GERD (gastroesophageal reflux disease), Hemodialysis patient (HCC), High blood pressure, Kidney failure,  Seizures (HCC), and Sleep apnea.   Surgical History    Past Surgical History:  Procedure Laterality Date  . AV FISTULA PLACEMENT     left arm -   . AV FISTULA PLACEMENT Left 06/26/2014   Procedure: Left arm AV fistula creation;  Surgeon: Annice Needy, MD;  Location: [facility] ORS;  Service: Vascular;  Laterality: Left;  . AV FISTULA REPAIR    . av fistula right upper arm     . av fistula right wrist     . DIALYSIS/PERMA CATHETER REMOVAL N/A 09/27/2017   Procedure: DIALYSIS/PERMA CATHETER REMOVAL;  Surgeon: Renford Dills, MD;  Location: [facility] INVASIVE CV LAB;  Service: Cardiovascular;  Laterality: N/A;  . LAPAROSCOPIC GASTRIC SLEEVE RESECTION N/A 05/05/2014   Procedure: LAPAROSCOPIC GASTRIC SLEEVE RESECTION;  Surgeon: Luretha Murphy, MD;  Location: [facility]  ORS;  Service: General;  Laterality: N/A;  . PERIPHERAL VASCULAR CATHETERIZATION N/A 06/02/2014   Procedure: A/V Shuntogram/Fistulagram;  Surgeon: Annice Needy, MD;  Location: [facility] INVASIVE CV LAB;  Service: Cardiovascular;  Laterality: N/A;  . PERIPHERAL VASCULAR CATHETERIZATION N/A 06/02/2014   Procedure: A/V Shunt Intervention;  Surgeon: Annice Needy, MD;  Location: [facility] INVASIVE CV LAB;  Service: Cardiovascular;  Laterality: N/A;  . PERIPHERAL VASCULAR CATHETERIZATION N/A 06/02/2014   Procedure: Dialysis/Perma Catheter Insertion;  Surgeon: Annice Needy, MD;  Location: [facility] INVASIVE CV LAB;  Service: Cardiovascular;  Laterality: N/A;  . PERIPHERAL VASCULAR CATHETERIZATION N/A 12/08/2014   Procedure: Dialysis/Perma Catheter Insertion;  Surgeon: Annice Needy, MD;  Location: [facility] INVASIVE CV LAB;  Service: Cardiovascular;  Laterality: N/A;  . PERIPHERAL VASCULAR CATHETERIZATION N/A 12/15/2014   Procedure: Dialysis/Perma Catheter Insertion;  Surgeon: Annice Needy, MD;  Location: [facility] INVASIVE CV LAB;  Service: Cardiovascular;  Laterality: N/A;  . PERIPHERAL VASCULAR CATHETERIZATION  12/15/2014   Procedure: Dialysis/Perma Catheter Removal;  Surgeon: Annice Needy, MD;  Location: [facility] INVASIVE CV LAB;  Service: Cardiovascular;;  . stents in left lower forearm      due to clotting in Av fistula   . thrombectomies to left lower foreram fistula     . tumor removed palm of right hand      benign      Social History   Social History   Socioeconomic History  . Marital status: Single    Spouse name: Not on file  . Number of children: 2  . Years of education: 59  . Highest education level: Not on file  Occupational History  . Occupation: Disabled    Comment: Disabled  Social Needs  . Financial resource strain: Not on file  . Food insecurity:    Worry: Not on file    Inability: Not on file  . Transportation needs:    Medical: Not on file    Non-medical: Not on file  Tobacco Use  . Smoking status: Former Smoker    Packs/day: 0.50    Years: 20.00    Pack years: 10.00    Types: Cigarettes    Last attempt to quit: 06/17/2013    Years since quitting: 4.2  . Smokeless tobacco: Never Used  Substance and Sexual Activity  . Alcohol use: No    Alcohol/week: 0.0 standard drinks  . Drug use: No  . Sexual activity: Yes  Lifestyle  . Physical activity:    Days per week: Not on file    Minutes per session: Not on file  . Stress: Not on file  Relationships  . Social connections:    Talks on phone: Not on file    Gets together: Not on file    Attends religious service: Not on file    Active member of club or organization: Not on file    Attends meetings of clubs or organizations: Not on file    Relationship status: Not on file  . Intimate partner violence:    Fear of current or ex partner: Not on file    Emotionally abused: Not on file    Physically abused: Not on file    Forced sexual activity: Not on file  Other Topics Concern  . Not on file  Social History Narrative   Patient lives home at home with his wife Hydrographic surveyor ). Patient is disabled.    Caffeine-one cup of coffee daily.   Right handed.  ,  reports that  he quit smoking about  4 years ago. His smoking use included cigarettes. He has a 10.00 pack-year smoking history. He has never used smokeless tobacco. He reports that he does not drink alcohol or use drugs.   Family history   His family history includes High blood pressure in his mother.   Allergies Allergies  Allergen Reactions  . Depakote [Divalproex Sodium] Other (See Comments)    Hallucinations   . Hydromorphone Other (See Comments)    Home meds  Prior to Admission medications   Medication Sig Start Date End Date Taking? Authorizing Provider  acetaminophen (TYLENOL) 325 MG tablet Take 650 mg by mouth every 6 (six) hours as needed for mild pain or moderate pain.    [provider]  alprazolam Prudy Feeler) 2 MG tablet Take 1 tablet by mouth at bedtime as needed for sleep.  08/14/17   [provider]  aspirin 81 MG chewable tablet Chew 1 tablet (81 mg total) by mouth daily. 08/12/17   Salary, Evelena Asa, MD  cinacalcet (SENSIPAR) 60 MG tablet Take 1 tablet (60 mg total) by mouth at bedtime. 08/02/17   Auburn Bilberry, MD  escitalopram (LEXAPRO) 20 MG tablet Take 20 mg by mouth at bedtime. 08/12/17   [provider]  gabapentin (NEURONTIN) 300 MG capsule Take 300 mg by mouth at bedtime as needed.  08/12/17   [provider]  lidocaine-prilocaine (EMLA) cream Apply 1 application topically as needed. Patient taking differently: Apply 1 application topically as needed (pain).  08/02/17   Auburn Bilberry, MD  midodrine (PROAMATINE) 10 MG tablet Take 1.5 tablets (15 mg total) by mouth every 8 (eight) hours. 08/02/17   Auburn Bilberry, MD  Multiple Vitamin (MULTIVITAMIN WITH MINERALS) TABS tablet Take 1 tablet by mouth daily. 09/27/17   Shaune Pollack, MD  multivitamin (RENA-VIT) TABS tablet Take 1 tablet by mouth at bedtime. 08/11/17   Salary, Evelena Asa, MD  multivitamin-lutein (OCUVITE-LUTEIN) CAPS capsule Take 1 capsule by mouth daily. 09/28/17   Shaune Pollack, MD  pantoprazole (PROTONIX) 20 MG  tablet Take 1 tablet (20 mg total) by mouth daily. 08/02/17   Auburn Bilberry, MD  polyethylene glycol [facility] Ethelene Hal) packet Take 17 g by mouth daily as needed for mild constipation. 08/02/17   Auburn Bilberry, MD  senna-docusate (SENOKOT-S) 8.6-50 MG tablet Take 2 tablets by mouth 2 (two) times daily. 08/13/17   Sharman Cheek, MD  sevelamer carbonate (RENVELA) 2.4 g PACK Take 2.4 g by mouth 3 (three) times daily with meals.    [provider]  vitamin A 16109 UNIT capsule Take 1 capsule (10,000 Units total) by mouth daily. 09/28/17   Shaune Pollack, MD  vitamin C (VITAMIN C) 250 MG tablet Take 1 tablet (250 mg total) by mouth 2 (two) times daily. 09/28/17   Shaune Pollack, MD  Vitamin D, Ergocalciferol, (DRISDOL) 50000 units CAPS capsule Take 1 capsule (50,000 Units total) by mouth every 7 (seven) days. 09/30/17   Shaune Pollack, MD  warfarin (COUMADIN) 4 MG tablet Take 1 tablet (4 mg total) by mouth daily. Patient taking differently: Take 3 mg by mouth daily.  08/02/17 10/01/17  Auburn Bilberry, MD     Joneen Roach, AGACNP-BC [facility] Pager [phone] or [phone]  09/27/2017 8:15 PM
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
## 2017-09-27 NOTE — Progress Notes (Signed)
Spoke to patient's mother, Sherren Kerns via telephone to obtain consent for TEE. Patient's mother states she has not been informed about TEE.
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
## 2017-09-27 NOTE — Progress Notes (Signed)
[facility], [place] 09/27/17  Subjective:  Left IJ PermCath removed. Undergoing 2D echocardiogram today. In good spirits at the moment.  Objective:  Vital signs in last 24 hours:  Temp:  [97.4 F (36.3 C)-98.5 F (36.9 C)] 97.5 F (36.4 C) (09/11 0925) Pulse Rate:  [76-89] 89 (09/11 0925) Resp:  [14-25] 25 (09/11 0925) BP: (83-102)/(23-54) 100/28 (09/11 0829) SpO2:  [94 %-100 %] 98 % (09/11 0925) Weight:  [79.1 kg-80.8 kg] 79.1 kg (09/10 1740)  Weight change: 0.1 kg Filed Weights   09/26/17 0636 09/26/17 1359 09/26/17 1740  Weight: 80.7 kg 80.8 kg 79.1 kg    Intake/Output:    Intake/Output Summary (Last 24 hours) at 09/27/2017 1020 Last data filed at 09/26/2017 1927 Gross per 24 hour  Intake 198.5 ml  Output 1500 ml  Net -1301.5 ml     Physical Exam: General:  No acute distress, laying in the bed, chronically ill-appearing  HEENT  moist oral mucous membranes  Neck  supple  Pulm/lungs  scattered rhonchi, normal effort  CVS/Heart  S1S2 no rubs  Abdomen:   Soft, nontender, BS present  Extremities:  Left arm edema  Neurologic:  Alert, oriented, left-sided weakness  Skin:  No acute rashes  Access:  Left IJ PC removed       Basic Metabolic Panel:  Recent Labs  Lab 09/21/17 1754 09/22/17 1251 09/25/17 0443 09/26/17 1426  NA 141  --  140 143  K 3.1*  --  3.3* 3.8  CL 98  --  100 98  CO2 28  --  28 27  GLUCOSE 117*  --  91 101*  BUN 28*  --  50* 65*  CREATININE 6.03*  --  7.43* 9.26*  CALCIUM 8.1*  --  9.0 8.8*  PHOS  --  3.8  --  3.9     CBC: Recent Labs  Lab 09/21/17 1754 09/26/17 1426  WBC 10.0 10.6  NEUTROABS 9.2*  --   HGB 12.0* 11.5*  HCT 37.4* 36.9*  MCV 83.4 85.3  PLT 253 397      Lab Results  Component Value Date   HEPBSAG Negative 09/22/2017   HEPBSAB Non Reactive 07/11/2016      Microbiology:  Recent Results (from the past 240 hour(s))  Blood Culture (routine x 2)     Status: Abnormal   Collection Time: 09/21/17  5:54 PM  Result Value Ref Range Status   Specimen Description   Final    BLOOD RIGHT HAND Performed at [facility], [address] [place], [place] [address]    Special Requests   Final    BOTTLES DRAWN AEROBIC AND ANAEROBIC Blood Culture results may not be optimal due to an excessive volume of blood received in culture bottles Performed at [facility], [address] [place], [place] [address]    Culture  Setup Time   Final    GRAM POSITIVE COCCI IN BOTH AEROBIC AND ANAEROBIC BOTTLES CRITICAL RESULT CALLED TO, READ BACK BY AND VERIFIED WITH: MATT MCBANE AT 0508 ON 09/22/17 [facility]. Performed at [facility], [address], [place], [place] [address]    Culture METHICILLIN RESISTANT STAPHYLOCOCCUS AUREUS (A)  Final   Report Status 09/24/2017 FINAL  Final   Organism ID, Bacteria METHICILLIN RESISTANT STAPHYLOCOCCUS AUREUS  Final      Susceptibility   Methicillin resistant staphylococcus aureus - MIC*    CIPROFLOXACIN >=8 RESISTANT Resistant     ERYTHROMYCIN >=8 RESISTANT Resistant  GENTAMICIN <=0.5 SENSITIVE Sensitive     OXACILLIN >=4 RESISTANT Resistant     TETRACYCLINE <=1 SENSITIVE Sensitive     VANCOMYCIN <=0.5 SENSITIVE Sensitive     TRIMETH/SULFA <=10 SENSITIVE Sensitive     CLINDAMYCIN <=0.25 SENSITIVE Sensitive     RIFAMPIN <=0.5 SENSITIVE Sensitive     Inducible Clindamycin NEGATIVE Sensitive     * METHICILLIN RESISTANT STAPHYLOCOCCUS AUREUS  Blood Culture ID Panel (Reflexed)     Status: Abnormal   Collection Time: 09/21/17  5:54 PM  Result Value Ref Range Status   Enterococcus species NOT DETECTED NOT DETECTED Final   Listeria monocytogenes NOT DETECTED NOT DETECTED Final   Staphylococcus species DETECTED (A) NOT DETECTED Final    Comment: CRITICAL RESULT CALLED TO, READ BACK BY AND VERIFIED WITH: MATT MCBANE AT 0508 ON 09/22/17 [facility].    Staphylococcus aureus DETECTED (A) NOT DETECTED Final    Comment:  Methicillin (oxacillin)-resistant Staphylococcus aureus (MRSA). MRSA is predictably resistant to beta-lactam antibiotics (except ceftaroline). Preferred therapy is vancomycin unless clinically contraindicated. Patient requires contact precautions if  hospitalized. CRITICAL RESULT CALLED TO, READ BACK BY AND VERIFIED WITH: MATT MCBANE AT 0508 ON 09/22/17 [facility].    Methicillin resistance DETECTED (A) NOT DETECTED Final    Comment: CRITICAL RESULT CALLED TO, READ BACK BY AND VERIFIED WITH: MATT MCBANE AT 0508 ON 09/22/17 [facility].    Streptococcus species NOT DETECTED NOT DETECTED Final   Streptococcus agalactiae NOT DETECTED NOT DETECTED Final   Streptococcus pneumoniae NOT DETECTED NOT DETECTED Final   Streptococcus pyogenes NOT DETECTED NOT DETECTED Final   Acinetobacter baumannii NOT DETECTED NOT DETECTED Final   Enterobacteriaceae species NOT DETECTED NOT DETECTED Final   Enterobacter cloacae complex NOT DETECTED NOT DETECTED Final   Escherichia coli NOT DETECTED NOT DETECTED Final   Klebsiella oxytoca NOT DETECTED NOT DETECTED Final   Klebsiella pneumoniae NOT DETECTED NOT DETECTED Final   Proteus species NOT DETECTED NOT DETECTED Final   Serratia marcescens NOT DETECTED NOT DETECTED Final   Haemophilus influenzae NOT DETECTED NOT DETECTED Final   Neisseria meningitidis NOT DETECTED NOT DETECTED Final   Pseudomonas aeruginosa NOT DETECTED NOT DETECTED Final   Candida albicans NOT DETECTED NOT DETECTED Final   Candida glabrata NOT DETECTED NOT DETECTED Final   Candida krusei NOT DETECTED NOT DETECTED Final   Candida parapsilosis NOT DETECTED NOT DETECTED Final   Candida tropicalis NOT DETECTED NOT DETECTED Final    Comment: Performed at [facility], [address] [place], [place] [address]  Blood Culture (routine x 2)     Status: Abnormal   Collection Time: 09/21/17  6:05 PM  Result Value Ref Range Status   Specimen Description   Final    BLOOD RIGHT [facility] Performed at [facility], [address], [place], [place] [address]    Special Requests   Final    BOTTLES DRAWN AEROBIC AND ANAEROBIC Blood Culture adequate volume Performed at [facility], [address] [place], [place] [address]    Culture  Setup Time   Final    GRAM POSITIVE COCCI IN BOTH AEROBIC AND ANAEROBIC BOTTLES CRITICAL RESULT CALLED TO, READ BACK BY AND VERIFIED WITH: MATT MCBANE AT 0508 ON 09/22/17 [facility]. Performed at [facility], [address] [place], [place] [address]    Culture (A)  Final    STAPHYLOCOCCUS AUREUS SUSCEPTIBILITIES PERFORMED ON PREVIOUS CULTURE WITHIN THE LAST 5 DAYS. Performed at [facility], [address] [place],  [place] [address]    Report Status 09/24/2017 FINAL  Final  MRSA PCR Screening     Status: Abnormal   Collection Time: 09/22/17 12:16 AM  Result Value Ref Range Status   MRSA by PCR POSITIVE (A) NEGATIVE Final    Comment:        The GeneXpert MRSA Assay (FDA approved for NASAL specimens only), is one component of a comprehensive MRSA colonization surveillance program. It is not intended to diagnose MRSA infection nor to guide or monitor treatment for MRSA infections. CRITICAL RESULT CALLED TO, READ BACK BY AND VERIFIED WITH: C/MARCELLA TURNER @0212  09/22/17 [facility] Performed at [facility], [address] [place], [place] [address]   CULTURE, BLOOD (ROUTINE X 2) w Reflex to ID Panel     Status: None   Collection Time: 09/22/17  9:03 AM  Result Value Ref Range Status   Specimen Description BLOOD RIGHT ANTECUBITAL  Final   Special Requests   Final    BOTTLES DRAWN AEROBIC AND ANAEROBIC Blood Culture adequate volume   Culture   Final    NO GROWTH 5 DAYS Performed at [facility], [address] [place], [place] [address]    Report Status 09/27/2017 FINAL  Final  CULTURE, BLOOD (ROUTINE X 2) w Reflex to ID Panel     Status: None   Collection Time: 09/22/17  9:03 AM  Result Value Ref Range  Status   Specimen Description BLOOD BLOOD LEFT HAND  Final   Special Requests   Final    BOTTLES DRAWN AEROBIC AND ANAEROBIC Blood Culture adequate volume   Culture   Final    NO GROWTH 5 DAYS Performed at [facility], [address] [place], [place] [address]    Report Status 09/27/2017 FINAL  Final    Coagulation Studies: Recent Labs    09/25/17 0443 09/26/17 0526 09/27/17 0517  LABPROT 69.4* 28.0* 18.4*  INR 8.45* 2.64 1.54    Urinalysis: No results for input(s): COLORURINE, LABSPEC, PHURINE, GLUCOSEU, HGBUR, BILIRUBINUR, KETONESUR, PROTEINUR, UROBILINOGEN, NITRITE, LEUKOCYTESUR in the last 72 hours.  Invalid input(s): APPERANCEUR    Imaging: No results found.   Medications:   . [MAR Hold] small volume/piggyback builder Stopped (09/27/17 0000)   . [MAR Hold] aspirin  81 mg Oral Daily  . butamben-tetracaine-benzocaine      . [MAR Hold] escitalopram  20 mg Oral QHS  . [MAR Hold] feeding supplement (NEPRO CARB STEADY)  237 mL Oral BID BM  . fentaNYL      . lidocaine      . [MAR Hold] mouth rinse  15 mL Mouth Rinse BID  . midazolam      . [MAR Hold] midodrine  15 mg Oral TID AC  . [MAR Hold] multivitamin  1 tablet Oral QHS  . [MAR Hold] multivitamin with minerals  1 tablet Oral Daily  . [MAR Hold] multivitamin-lutein  1 capsule Oral Daily  . [MAR Hold] pantoprazole  40 mg Oral QAC breakfast  . [MAR Hold] senna-docusate  2 tablet Oral BID  . sodium chloride flush      . [MAR Hold] vitamin A  10,000 Units Oral Daily  . [MAR Hold] vitamin C  250 mg Oral BID  . [MAR Hold] Vitamin D (Ergocalciferol)  50,000 Units Oral Q7 days  . [MAR Hold] warfarin  3 mg Oral q1800  . [MAR Hold] Warfarin - Pharmacist Dosing Inpatient   Does not apply q1800   [MAR Hold] acetaminophen **OR** [MAR Hold] acetaminophen, [  MAR Hold] albuterol, [MAR Hold] alprazolam, [MAR Hold]  morphine injection, [MAR Hold] ondansetron **OR** [MAR Hold] ondansetron (ZOFRAN) IV, [MAR Hold]  polyethylene glycol, [MAR Hold] senna-docusate  Assessment/ Plan:  47 y.o. African-American male End-stage renal disease on hemodialysis MWF followed by [employer], chronic back pain, GERD, hypertension, seizure disorder, obstructive sleep apnea, anemia chronic kidney disease, secondary hyperparathyroidism, anxiety   [facility]/ [address] [facility]/ EDW 86 kg/ MWF/CVC  Patient admitted for cough, fever.  1.  End-stage renal disease -Patient seen at bedside.  No indication for dialysis at the moment.  He did undergo dialysis yesterday.  We will plan for dialysis again on Friday once access established.  2.  Fever, cough.  Chest x-ray shows pulmonary edema and bilateral pleural effusions -Blood cultures positive for gram-positive cocci.  ID panel shows MRSA -Differential includes pneumonia versus dialysis catheter infection - ID recommends cathter removal. -PermCath has been removed this a.m.  Blood cultures from 09/22/2017 are negative.  Consider replacing permacath on Friday.  3.  Anemia of chronic kidney disease -Most recent hemoglobin is 11.5.  Hold off on Epogen at this time.  4.  Secondary hyperparathyroidism -Phosphorus was 3.9 and at target.  Patient off binders at the moment.  5. Coagulopathy INR currently 1.54.    LOS: 6 Perry [name] 9/11/201910:20 AM  [facility] [address] [place], [place] [phone]  Note: This note was prepared with Dragon dictation. Any transcription errors are unintentional

## 2017-09-27 NOTE — Progress Notes (Signed)
No acute problems noted. Pt transported with [facility] en route to main campus. Pt on O2 at 2L [place]. Family at bedside. Instructions given. Report given to receiving RN, Gabe.

## 2017-09-28 ENCOUNTER — Encounter (HOSPITAL_COMMUNITY): Payer: Self-pay

## 2017-09-28 ENCOUNTER — Ambulatory Visit: Payer: Medicare Other | Admitting: Cardiovascular Disease

## 2017-09-28 ENCOUNTER — Other Ambulatory Visit: Payer: Self-pay

## 2017-09-28 DIAGNOSIS — G4733 Obstructive sleep apnea (adult) (pediatric): Secondary | ICD-10-CM

## 2017-09-28 DIAGNOSIS — I120 Hypertensive chronic kidney disease with stage 5 chronic kidney disease or end stage renal disease: Secondary | ICD-10-CM

## 2017-09-28 DIAGNOSIS — Z7401 Bed confinement status: Secondary | ICD-10-CM

## 2017-09-28 DIAGNOSIS — D6859 Other primary thrombophilia: Secondary | ICD-10-CM

## 2017-09-28 DIAGNOSIS — B9562 Methicillin resistant Staphylococcus aureus infection as the cause of diseases classified elsewhere: Secondary | ICD-10-CM

## 2017-09-28 DIAGNOSIS — Z888 Allergy status to other drugs, medicaments and biological substances status: Secondary | ICD-10-CM

## 2017-09-28 DIAGNOSIS — I080 Rheumatic disorders of both mitral and aortic valves: Secondary | ICD-10-CM

## 2017-09-28 DIAGNOSIS — Z66 Do not resuscitate: Secondary | ICD-10-CM

## 2017-09-28 DIAGNOSIS — R011 Cardiac murmur, unspecified: Secondary | ICD-10-CM

## 2017-09-28 DIAGNOSIS — Z885 Allergy status to narcotic agent status: Secondary | ICD-10-CM

## 2017-09-28 DIAGNOSIS — Z87891 Personal history of nicotine dependence: Secondary | ICD-10-CM

## 2017-09-28 DIAGNOSIS — Z9884 Bariatric surgery status: Secondary | ICD-10-CM

## 2017-09-28 DIAGNOSIS — Z8619 Personal history of other infectious and parasitic diseases: Secondary | ICD-10-CM

## 2017-09-28 DIAGNOSIS — Z8249 Family history of ischemic heart disease and other diseases of the circulatory system: Secondary | ICD-10-CM

## 2017-09-28 DIAGNOSIS — I69351 Hemiplegia and hemiparesis following cerebral infarction affecting right dominant side: Secondary | ICD-10-CM

## 2017-09-28 LAB — BASIC METABOLIC PANEL
ANION GAP: 16 — AB (ref 5–15)
BUN: 38 mg/dL — ABNORMAL HIGH (ref 6–20)
CALCIUM: 8.6 mg/dL — AB (ref 8.9–10.3)
CO2: 23 mmol/L (ref 22–32)
CREATININE: 7.23 mg/dL — AB (ref 0.61–1.24)
Chloride: 100 mmol/L (ref 98–111)
GFR calc Af Amer: 9 mL/min — ABNORMAL LOW (ref >60)
GFR, EST NON AFRICAN AMERICAN: 8 mL/min — AB (ref >60)
GLUCOSE: 85 mg/dL (ref 70–99)
Potassium: 3.8 mmol/L (ref 3.5–5.1)
Sodium: 139 mmol/L (ref 135–145)

## 2017-09-28 LAB — CBC
HCT: 39.9 % (ref 39.0–52.0)
HEMOGLOBIN: 11.6 g/dL — AB (ref 13.0–17.0)
MCH: 25.6 pg — ABNORMAL LOW (ref 26.0–34.0)
MCHC: 29.1 g/dL — ABNORMAL LOW (ref 30.0–36.0)
MCV: 88.1 fL (ref 78.0–100.0)
Platelets: 483 10*3/uL — ABNORMAL HIGH (ref 150–400)
RBC: 4.53 MIL/uL (ref 4.22–5.81)
RDW: 22.1 % — ABNORMAL HIGH (ref 11.5–15.5)
WBC: 7.8 10*3/uL (ref 4.0–10.5)

## 2017-09-28 LAB — MAGNESIUM: Magnesium: 2.3 mg/dL (ref 1.7–2.4)

## 2017-09-28 LAB — PHOSPHORUS: Phosphorus: 2.9 mg/dL (ref 2.5–4.6)

## 2017-09-28 LAB — PROTIME-INR
INR: 1.86
Prothrombin Time: 21.3 seconds — ABNORMAL HIGH (ref 11.4–15.2)

## 2017-09-28 MED ORDER — VANCOMYCIN VARIABLE DOSE PER UNSTABLE RENAL FUNCTION (PHARMACIST DOSING): Status: DC

## 2017-09-28 MED ORDER — PROSIGHT PO TABS
1.0000 | ORAL_TABLET | Freq: Every day | ORAL | Status: DC
  Administered 2017-09-29 – 2017-10-09 (×10): 1 via ORAL
  Filled 2017-09-28 (×11): qty 1

## 2017-09-28 NOTE — Consult Note (Signed)
[facility]    Date of Admission:  09/27/2017   Total days of antibiotics 8        Day 8 Vancomycin                Reason for Consult: MRSA bacteremia with endocarditis     Referring Provider: transfer from [facility] Primary Care Provider: [facility]   Assessment: Perry [name] is a 47 y.o. 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 He has been seen by Dr. Minh Sage and with poor functional capacity, request for DNR status, ESRD and other co morbidities he is not deemed an appropriate surgical candidate. With extensive disease and damage would consider treating with 8 weeks IV and consideration for additional therapy thereafter as he will require re-insertion of HD cath. His HD catheter was removed yesterday and repeat BCx following removal yesterday pending. Would consider replacing this for him tomorrow 9/13 for 48h line holiday. Afebrile, WBC normalized. No other signs of metastatic infection per ROS.     Plan: 1. Continue vancomycin  2. Support per cardiology service appreciated  3. Follow micro data from 9/11 blood cultures  4. HD cath to be replaced tomorrow 9-13 if possible.   Principal Problem:   Acute bacterial endocarditis Active Problems:   MRSA bacteremia   ESRD (end stage renal disease) on dialysis (HCC)   H/O: stroke   . aspirin  81 mg Oral Daily  . cinacalcet  60 mg Oral QHS  . escitalopram  20 mg Oral QHS  . midodrine  15 mg Oral TID  . multivitamin  1 tablet Oral Daily  . multivitamin  1 tablet Oral QHS  . pantoprazole  20 mg Oral Daily  . sevelamer carbonate  2.4 g Oral TID WC  . vancomycin variable dose per unstable renal function (pharmacist dosing)   Does not apply See admin instructions  . vitamin A  10,000 Units Oral Daily  .  ascorbic acid  250 mg Oral BID  . [START ON 09/30/2017] Vitamin D (Ergocalciferol)  50,000 Units Oral Q7 days    HPI: Perry [name] is a 47 y.o. male with h/o ESRD (hemo performed via tunneled catheter), CVA, HTN, MRSA bacteremia 04-2017 (tx with 4w vancomycin), gastric bypass and now recurrent MRSA bacteremia that was thought to be due to HD catheter. He was seen by Dr. Rivka Safer at [facility] and transferred here for consideration of CT surgery/valve replacement after TEE revealed large vegetation at the base of the anterior mitral leaflet c/w septal perforation tracking up to aortic valve; suspected abscess; severe AV regurgitation with shunting to left atrium, severe MR. He was started on vancomycin with plan for at least 6 weeks. He has been mostly bed-ridden at home and total care is performed by his mother. He is now a DNR at his request.   He tells me that he has been having fevers for about a week prior to admission. Has had to use HD catheters d/t chronically clotting fistulas (there is mention of +antiphospholipid antibody syndrome contributing as well). He receives dialysis at home with his mother. Otherwise does not contribute much to conversation.   Review of Systems  Constitutional: Positive for chills, fever and malaise/fatigue.  Respiratory: Negative for cough and shortness of breath.   Cardiovascular: Negative for  chest pain, orthopnea and leg swelling.  Gastrointestinal: Negative for abdominal pain and diarrhea.  Genitourinary:       Anuric   Musculoskeletal: Negative for back pain, joint pain and myalgias.  Skin: Negative for rash.  Neurological: Positive for weakness. Negative for dizziness.    Past Medical History:  Diagnosis Date  . Acute meniscal tear of left knee   . Acute meniscal tear of right knee   . Anxiety   . Chronic back pain    due to weight   . Dialysis patient (HCC)   . Fracture of transverse process of thoracic vertebra (HCC)    [facility]- [facility] by neurosurgery  . GERD (gastroesophageal reflux disease)   . Hemodialysis patient [facility](HCC)    since 2011, does hemodialysis at home, wife does she is a [employer] , Daily except wed and sun  . High blood pressure    hx of   . Kidney failure    on dialysis since [facility]- [facility]- nephrologist   . Seizures (HCC)   . Sleep apnea    cpap-     Social History   Tobacco Use  . Smoking status: Former Smoker    Packs/day: 0.50    Years: 20.00    Pack years: 10.00    Types: Cigarettes    Last attempt to quit: 06/17/2013    Years since quitting: 4.2  . Smokeless tobacco: Never Used  Substance Use Topics  . Alcohol use: No    Alcohol/week: 0.0 standard drinks  . Drug use: No    Family History  Problem Relation Age of Onset  . High blood pressure Mother    Allergies  Allergen Reactions  . Depakote [Divalproex Sodium] Other (See Comments)    Hallucinations   . Hydromorphone Other (See Comments)    OBJECTIVE: Blood pressure (!) 93/34, pulse 79, temperature 98.2 F (36.8 C), temperature source Axillary, resp. rate 17, height 5\' 6"  (1.676 m), weight 74.1 kg, SpO2 100 %.  Physical Exam  Constitutional: He appears well-developed.  Obese man resting in bed. Answers questions appropriately but withdraws.   HENT:  Mouth/Throat: Oropharynx is clear and moist. No oral lesions. Normal dentition. No dental caries.  Eyes: Pupils are equal, round, and reactive to light. No scleral icterus.  Neck: Neck supple.  Difficult to appreciate JVD with body habitus   Cardiovascular: Normal rate and regular rhythm.  Murmur (systolic murmur @ apex ) heard. Pulmonary/Chest: Effort normal and breath sounds normal.  Abdominal: Soft. He exhibits no distension. There is no tenderness.  Musculoskeletal: He exhibits no edema.  Lymphadenopathy:    He has no cervical adenopathy.  Neurological: He is alert.  Moving all extremeties weakly.   Skin: Skin is warm and dry. Capillary refill  takes less than 2 seconds. No rash noted.  HD cath removed previously from L chest. Dressing intact.   Psychiatric:  Withdrawn   Nursing note and vitals reviewed.   Lab Results Lab Results  Component Value Date   WBC 7.8 09/28/2017   HGB 11.6 (L) 09/28/2017   HCT 39.9 09/28/2017   MCV 88.1 09/28/2017   PLT 483 (H) 09/28/2017    Lab Results  Component Value Date   CREATININE 7.23 (H) 09/28/2017   BUN 38 (H) 09/28/2017   NA 139 09/28/2017   K 3.8 09/28/2017   CL 100 09/28/2017   CO2 23 09/28/2017    Lab Results  Component Value Date   ALT 18 09/21/2017  AST 27 09/21/2017   ALKPHOS 86 09/21/2017   BILITOT 1.4 (H) 09/21/2017     Microbiology: Recent Results (from the past 240 hour(s))  Blood Culture (routine x 2)     Status: Abnormal   Collection Time: 09/21/17  5:54 PM  Result Value Ref Range Status   Specimen Description   Final    BLOOD RIGHT HAND Performed at [facility], [address], [place], [place] [address]    Special Requests   Final    BOTTLES DRAWN AEROBIC AND ANAEROBIC Blood Culture results may not be optimal due to an excessive volume of blood received in culture bottles Performed at [facility], [address] [place], [place] [address]    Culture  Setup Time   Final    GRAM POSITIVE COCCI IN BOTH AEROBIC AND ANAEROBIC BOTTLES CRITICAL RESULT CALLED TO, READ BACK BY AND VERIFIED WITH: MATT MCBANE AT 0508 ON 09/22/17 MMC. Performed at [facility], [address], [place], [place] [address]    Culture METHICILLIN RESISTANT STAPHYLOCOCCUS AUREUS (A)  Final   Report Status 09/24/2017 FINAL  Final   Organism ID, Bacteria METHICILLIN RESISTANT STAPHYLOCOCCUS AUREUS  Final      Susceptibility   Methicillin resistant staphylococcus aureus - MIC*    CIPROFLOXACIN >=8 RESISTANT Resistant     ERYTHROMYCIN >=8 RESISTANT Resistant     GENTAMICIN <=0.5 SENSITIVE Sensitive     OXACILLIN >=4 RESISTANT Resistant     TETRACYCLINE  <=1 SENSITIVE Sensitive     VANCOMYCIN <=0.5 SENSITIVE Sensitive     TRIMETH/SULFA <=10 SENSITIVE Sensitive     CLINDAMYCIN <=0.25 SENSITIVE Sensitive     RIFAMPIN <=0.5 SENSITIVE Sensitive     Inducible Clindamycin NEGATIVE Sensitive     * METHICILLIN RESISTANT STAPHYLOCOCCUS AUREUS  Blood Culture ID Panel (Reflexed)     Status: Abnormal   Collection Time: 09/21/17  5:54 PM  Result Value Ref Range Status   Enterococcus species NOT DETECTED NOT DETECTED Final   Listeria monocytogenes NOT DETECTED NOT DETECTED Final   Staphylococcus species DETECTED (A) NOT DETECTED Final    Comment: CRITICAL RESULT CALLED TO, READ BACK BY AND VERIFIED WITH: MATT MCBANE AT 0508 ON 09/22/17 MMC.    Staphylococcus aureus DETECTED (A) NOT DETECTED Final    Comment: Methicillin (oxacillin)-resistant Staphylococcus aureus (MRSA). MRSA is predictably resistant to beta-lactam antibiotics (except ceftaroline). Preferred therapy is vancomycin unless clinically contraindicated. Patient requires contact precautions if  hospitalized. CRITICAL RESULT CALLED TO, READ BACK BY AND VERIFIED WITH: MATT MCBANE AT 0508 ON 09/22/17 MMC.    Methicillin resistance DETECTED (A) NOT DETECTED Final    Comment: CRITICAL RESULT CALLED TO, READ BACK BY AND VERIFIED WITH: MATT MCBANE AT 0508 ON 09/22/17 MMC.    Streptococcus species NOT DETECTED NOT DETECTED Final   Streptococcus agalactiae NOT DETECTED NOT DETECTED Final   Streptococcus pneumoniae NOT DETECTED NOT DETECTED Final   Streptococcus pyogenes NOT DETECTED NOT DETECTED Final   Acinetobacter baumannii NOT DETECTED NOT DETECTED Final   Enterobacteriaceae species NOT DETECTED NOT DETECTED Final   Enterobacter cloacae complex NOT DETECTED NOT DETECTED Final   Escherichia coli NOT DETECTED NOT DETECTED Final   Klebsiella oxytoca NOT DETECTED NOT DETECTED Final   Klebsiella pneumoniae NOT DETECTED NOT DETECTED Final   Proteus species NOT DETECTED NOT DETECTED Final   Serratia  marcescens NOT DETECTED NOT DETECTED Final   Haemophilus influenzae NOT DETECTED NOT DETECTED Final   Neisseria meningitidis NOT DETECTED NOT DETECTED  Final   Pseudomonas aeruginosa NOT DETECTED NOT DETECTED Final   Candida albicans NOT DETECTED NOT DETECTED Final   Candida glabrata NOT DETECTED NOT DETECTED Final   Candida krusei NOT DETECTED NOT DETECTED Final   Candida parapsilosis NOT DETECTED NOT DETECTED Final   Candida tropicalis NOT DETECTED NOT DETECTED Final    Comment: Performed at [facility], [address] [place], [place] [address]  Blood Culture (routine x 2)     Status: Abnormal   Collection Time: 09/21/17  6:05 PM  Result Value Ref Range Status   Specimen Description   Final    BLOOD RIGHT [facility] Performed at [facility], [address], [place], [place] [address]    Special Requests   Final    BOTTLES DRAWN AEROBIC AND ANAEROBIC Blood Culture adequate volume Performed at [facility], [address] [place], [place] [address]    Culture  Setup Time   Final    GRAM POSITIVE COCCI IN BOTH AEROBIC AND ANAEROBIC BOTTLES CRITICAL RESULT CALLED TO, READ BACK BY AND VERIFIED WITH: MATT MCBANE AT 0508 ON 09/22/17 MMC. Performed at [facility], [address] [place], [place] [address]    Culture (A)  Final    STAPHYLOCOCCUS AUREUS SUSCEPTIBILITIES PERFORMED ON PREVIOUS CULTURE WITHIN THE LAST 5 DAYS. Performed at [facility], [address] [place], [place] [address]    Report Status 09/24/2017 FINAL  Final  MRSA PCR Screening     Status: Abnormal   Collection Time: 09/22/17 12:16 AM  Result Value Ref Range Status   MRSA by PCR POSITIVE (A) NEGATIVE Final    Comment:        The GeneXpert MRSA Assay (FDA approved for NASAL specimens only), is one component of a comprehensive MRSA colonization surveillance program. It is not intended to diagnose MRSA infection nor to guide or monitor treatment for MRSA  infections. CRITICAL RESULT CALLED TO, READ BACK BY AND VERIFIED WITH: C/MARCELLA TURNER @0212  09/22/17 FLC Performed at [facility], [address] [place], [place] [address]   CULTURE, BLOOD (ROUTINE X 2) w Reflex to ID Panel     Status: None   Collection Time: 09/22/17  9:03 AM  Result Value Ref Range Status   Specimen Description BLOOD RIGHT ANTECUBITAL  Final   Special Requests   Final    BOTTLES DRAWN AEROBIC AND ANAEROBIC Blood Culture adequate volume   Culture   Final    NO GROWTH 5 DAYS Performed at [facility], [address] [place], [place] [address]    Report Status 09/27/2017 FINAL  Final  CULTURE, BLOOD (ROUTINE X 2) w Reflex to ID Panel     Status: None   Collection Time: 09/22/17  9:03 AM  Result Value Ref Range Status   Specimen Description BLOOD BLOOD LEFT HAND  Final   Special Requests   Final    BOTTLES DRAWN AEROBIC AND ANAEROBIC Blood Culture adequate volume   Culture   Final    NO GROWTH 5 DAYS Performed at [facility], [address], [place], [place] [address]    Report Status 09/27/2017 FINAL  Final    Rexene Alberts, MSN, NP-C [facility] Cell: [phone] Pager: [phone]  09/28/2017 9:33 AM
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
## 2017-09-28 NOTE — Progress Notes (Signed)
BP 79/25, MD paged Verbal orders for a 500 cc bolus if systolic drops below 70 or there is a change in mental status.  Will continue to monitor
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
## 2017-09-28 NOTE — Plan of Care (Signed)
  Problem: Coping: Goal: Level of anxiety will decrease Outcome: Progressing   Problem: Pain Managment: Goal: General experience of comfort will improve Outcome: Progressing   Problem: Safety: Goal: Ability to remain free from injury will improve Outcome: Progressing   Problem: Skin Integrity: Goal: Risk for impaired skin integrity will decrease Outcome: Progressing   

## 2017-09-28 NOTE — Progress Notes (Addendum)
JAMARR TREINEN  WUJ:[id number] DOB: 30-Dec-1970 DOA: 09/27/2017 PCP: [facility]    LOS: 1 day   Reason for Consult / Chief Complaint:  Bacteremia  Consulting MD and date:  Dr. Imogene Burn: [facility] hospitalist  HPI/Summary of hospital stay:  47 year old male with PMH notable for ESRD on HD via tunneled catheter. This was unfortunately complicated by bacteremia in April of this year. This was treated with catheter exchange and prolonged course of vancomycin. TEE was negative, but course was complicated by CVA during that admission, from which he now has residual L sided hemiparesis. He is reportedly bedbound and requires fully assisted care. He then presented to [facility] 9/11 with complaints of fevers and SOB. He was admitted and started on ABX to rule out PNA, however, blood cultures grew MRSA. He was narrowed to vancomycin. HD catheter was removed. He had TEE done 9/11 demonstrating large mitral vegetation with septal perforation tracking into the aortic valve, suspected abscess. 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 He was transferred to [facility] for CVTS evaluation.   Subjective:  No issues overnight. No complaints.  Not surgical candidate per CVTS.   Objective   Blood pressure (!) 93/34, pulse 79, temperature 98.2 F (36.8 C), temperature source Axillary, resp. rate 17, height 5\' 6"  (1.676 m), weight 74.1 kg, SpO2 100 %.        Intake/Output Summary (Last 24 hours) at 09/28/2017 0928 Last data filed at 09/27/2017 2300 Gross per 24 hour  Intake 400 ml  Output -  Net 400 ml   Filed Weights   09/27/17 1830 09/28/17 0600  Weight: 74 kg 74.1 kg    Examination: General: overweight middle aged male in NAD HENT: [place]/AT, PERRL, Poor dentition, JVD Lungs: resps even non labored on 2L [place], diminished bases  Cardiovascular: RRR. 4/6 SEM Abdomen: Soft, non-tender, non-distened Extremities: No acute deformity Neuro: confusion, somnolence at times, reportedly somewhat his  baseline post stroke   Consults: date of consult/date signed off & final recs:   CVTS: Dr Duvall Sage 9/11>>not surgical candidate   Procedures:   Significant Diagnostic Tests: TEE 9/11 > Large vegetation noted at the base of the anterior mitral leaflet, appears to have septal perforation tracking up to aortic valve, suspected abscess,  Severe aortic valve regurgitation with shunt into left atrium, severe MR Grossly aortic valve and mitral valve appear to be intact, unable to exclude valve leaflet perforation.  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 NOT surgical candidate per CVTS  - Continue vanocmycin per pharmacy. Min 6 weeks  - Has been evaluated for surgery by Dr. Lavaris Sage. Awaiting his recommendations, suspect he will be considered a poor surgical candidate even if acute infection is able to be treated effectively. - Repeat BC in AM - Follow WBC and fever curve. - Line holiday as able. Normally MWF HD but no urgent indication - will try for line holiday as able   Chronic hypotension - Continue midodrine   OSA on CPAP - Continue qhs CPAP  ESRD on HD (MWF) - discussed with renal who will see - would like to give line holiday as long as possible.  ??temp HD cath initially (?9/13) but will need new perm cath soon as well.  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 Functional quadriplegia.  - Supportive care.   Anticoagulation chronic warfarin: INR initially therapeutic, now subtherapeutic.  - hold for now until HD cath placed    Will ask TRH to assume care 9/13  Disposition / Summary of Today's Plan 09/28/17   Not surgical candidate per CVTS  Will have palliative care assist  tx out of ICU  Renal to see      DVT prophylaxis: holding  warfarin for possible procedure.  GI prophylaxis: home PPI Diet: Renal with fluid restriction. NPO after midnight for possible procedure.  Mobility: Bedrest Code Status: DNR Family Communication: no family at bedside 9/12  Labs   CBC: Recent Labs  Lab 09/21/17 1754 09/26/17 1426 09/28/17 0700  WBC 10.0 10.6 7.8  NEUTROABS 9.2*  --   --   HGB 12.0* 11.5* 11.6*  HCT 37.4* 36.9* 39.9  MCV 83.4 85.3 88.1  PLT 253 397 483*   Basic Metabolic Panel: Recent Labs  Lab 09/21/17 1754 09/22/17 1251 09/25/17 0443 09/26/17 1426 09/28/17 0700  NA 141  --  140 143 139  K 3.1*  --  3.3* 3.8 3.8  CL 98  --  100 98 100  CO2 28  --  28 27 23   GLUCOSE 117*  --  91 101* 85  BUN 28*  --  50* 65* 38*  CREATININE 6.03*  --  7.43* 9.26* 7.23*  CALCIUM 8.1*  --  9.0 8.8* 8.6*  MG  --   --   --   --  2.3  PHOS  --  3.8  --  3.9 2.9   GFR: Estimated Creatinine Clearance: 11.5 mL/min (A) (by C-G formula based on SCr of 7.23 mg/dL (H)). Recent Labs  Lab 09/21/17 1754 09/26/17 1426 09/28/17 0700  WBC 10.0 10.6 7.8  LATICACIDVEN 1.5  --   --    Liver Function Tests: Recent Labs  Lab 09/21/17 1754 09/26/17 1426  AST 27  --   ALT 18  --   ALKPHOS 86  --   BILITOT 1.4*  --   PROT 6.5  --   ALBUMIN 2.5* 2.4*   No results for input(s): LIPASE, AMYLASE in the last 168 hours. Recent Labs  Lab 09/22/17 1315  AMMONIA 15   ABG    Component Value Date/Time   PHART 7.386 05/06/2015 1812   PCO2ART 45.3 (H) 05/06/2015 1812   PO2ART 120 (H) 05/06/2015 1812   HCO3 25.8 (H) 05/06/2015 1812   ACIDBASEDEF 0.5 05/06/2015 1204   O2SAT 97.8 05/06/2015 1812    Coagulation Profile: Recent Labs  Lab 09/24/17 0737 09/25/17 0443 09/26/17 0526 09/27/17 0517 09/28/17 0700  INR 6.57* 8.45* 2.64 1.54 1.86   Cardiac Enzymes: No results for input(s): CKTOTAL, CKMB, CKMBINDEX, TROPONINI in the last 168 hours. HbA1C: No results found for: HGBA1C CBG: No results for input(s): GLUCAP in the  last 168 hours.   Dirk DressKaty Whiteheart, NP 09/28/2017  9:28 AM Pager: [phone] or [phone]) [phone]  Attending Note:  47 year old male with extensive PMH who presents to the hospital with endocarditis.  [facility].  Patient was seen, not an operative candidate.  On exam, lungs are clear.  I reviewed CXR myself, pulmonary edema noted.  Discussed with PCCM-NP.  Patient is a full DNR.  Endocarditis:  - Vanc  - Appreciate input from ID  ESRD:  - HD catheter to be placed tunneled per renal  service recommendations  - HD per renal  CHF:  - Continue home medications  - DNR status  Chronic hypotension:  - Midodrine  OSA:  - CPAP  CVA: functional quad  - PT  Transfer patient to med-surg and to [facility] service with PCCM off 9/13.  Patient seen and examined, agree with above note.  I dictated the care and orders written for this patient under my direction.  Alyson Reedy, MD [phone]

## 2017-09-29 DIAGNOSIS — Z515 Encounter for palliative care: Secondary | ICD-10-CM

## 2017-09-29 DIAGNOSIS — D6861 Antiphospholipid syndrome: Secondary | ICD-10-CM

## 2017-09-29 LAB — PROTIME-INR
INR: 2.27
INR: 2.39
Prothrombin Time: 24.9 seconds — ABNORMAL HIGH (ref 11.4–15.2)
Prothrombin Time: 25.8 seconds — ABNORMAL HIGH (ref 11.4–15.2)

## 2017-09-29 LAB — RENAL FUNCTION PANEL
ANION GAP: 17 — AB (ref 5–15)
Albumin: 2.5 g/dL — ABNORMAL LOW (ref 3.5–5.0)
BUN: 45 mg/dL — ABNORMAL HIGH (ref 6–20)
CALCIUM: 8.5 mg/dL — AB (ref 8.9–10.3)
CHLORIDE: 99 mmol/L (ref 98–111)
CO2: 23 mmol/L (ref 22–32)
Creatinine, Ser: 8.67 mg/dL — ABNORMAL HIGH (ref 0.61–1.24)
GFR calc non Af Amer: 7 mL/min — ABNORMAL LOW (ref >60)
GFR, EST AFRICAN AMERICAN: 8 mL/min — AB (ref >60)
Glucose, Bld: 78 mg/dL (ref 70–99)
Phosphorus: 3.4 mg/dL (ref 2.5–4.6)
Potassium: 4.2 mmol/L (ref 3.5–5.1)
Sodium: 139 mmol/L (ref 135–145)

## 2017-09-29 LAB — CBC
HCT: 42.9 % (ref 39.0–52.0)
HEMOGLOBIN: 12.7 g/dL — AB (ref 13.0–17.0)
MCH: 25.7 pg — AB (ref 26.0–34.0)
MCHC: 29.6 g/dL — ABNORMAL LOW (ref 30.0–36.0)
MCV: 86.7 fL (ref 78.0–100.0)
Platelets: 508 10*3/uL — ABNORMAL HIGH (ref 150–400)
RBC: 4.95 MIL/uL (ref 4.22–5.81)
RDW: 22.5 % — ABNORMAL HIGH (ref 11.5–15.5)
WBC: 6.5 10*3/uL (ref 4.0–10.5)

## 2017-09-29 LAB — TYPE AND SCREEN
ABO/RH(D): O POS
ANTIBODY SCREEN: NEGATIVE

## 2017-09-29 LAB — CATH TIP CULTURE

## 2017-09-29 LAB — ABO/RH: ABO/RH(D): O POS

## 2017-09-29 LAB — VANCOMYCIN, RANDOM: VANCOMYCIN RM: 35

## 2017-09-29 MED ORDER — VITAMIN K1 10 MG/ML IJ SOLN
10.0000 mg | Freq: Once | INTRAVENOUS | Status: AC
  Administered 2017-09-29: 10 mg via INTRAVENOUS
  Filled 2017-09-29: qty 1

## 2017-09-29 MED ORDER — SODIUM CHLORIDE 0.9% IV SOLUTION
Freq: Once | INTRAVENOUS | Status: AC
  Administered 2017-09-29: 13:00:00 via INTRAVENOUS

## 2017-09-29 MED ORDER — CHLORHEXIDINE GLUCONATE CLOTH 2 % EX PADS
6.0000 | MEDICATED_PAD | Freq: Every day | CUTANEOUS | Status: DC
  Administered 2017-09-30 – 2017-10-09 (×10): 6 via TOPICAL

## 2017-09-29 NOTE — Progress Notes (Addendum)
Subjective: Perry [name] was seen resting in his bed this morning. Perry [name] was not verbally communicative. Perry [name] responded non-verbally to questions. Perry [name] denied having any sob, generalized pain, or chest pain.   Objective: Vital signs in last 24 hours: Temp:  [97.5 F (36.4 C)-98.6 F (37 C)] 98.2 F (36.8 C) (09/13 0548) Pulse Rate:  [75-85] 85 (09/13 0548) Resp:  [13-25] 16 (09/13 0548) BP: (70-112)/(25-53) 94/30 (09/13 0548) SpO2:  [90 %-100 %] 100 % (09/13 0548) Weight change:   Intake/Output from previous day: 09/12 0701 - 09/13 0700 In: 620 [P.O.:620] Out: 0  Intake/Output this shift: No intake/output data recorded.  Physical Exam  Constitutional: Perry [name] appears lethargic. Perry [name] is cooperative. Perry [name] is easily aroused. Perry [name] appears ill.  HENT:  Head: Normocephalic and atraumatic.  Eyes: Conjunctivae are normal.  Cardiovascular: Normal rate, regular rhythm and normal heart sounds.  Respiratory: Effort normal. Perry [name] has wheezes.  GI: Soft. Bowel sounds are normal. Perry [name] exhibits no distension. There is no tenderness.  Musculoskeletal: Perry [name] exhibits no edema.  Neurological: Perry [name] is easily aroused. Perry [name] appears lethargic.  Able to respond to questions non-verbally, but has not been interactive with provider today and does not answer questions verbally.   Skin: No erythema.    Lab Results: Recent Labs    09/28/17 0700 09/29/17 0358  WBC 7.8 6.5  HGB 11.6* 12.7*  HCT 39.9 42.9  PLT 483* 508*   BMET:  Recent Labs    09/28/17 0700 09/29/17 0358  NA 139 139  K 3.8 4.2  CL 100 99  CO2 23 23  GLUCOSE 85 78  BUN 38* 45*  CREATININE 7.23* 8.67*  CALCIUM 8.6* 8.5*   No results for input(s): PTH in the last 72 hours. Iron Studies: No results for input(s): IRON, TIBC, TRANSFERRIN, FERRITIN in the last 72 hours. Studies/Results: No results found.  I have reviewed the patient's current medications.  Assessment/Plan:  ESRD on hemodialysis MWF The patient's creatinine continues to increase. It  is at 8.67 today from prior 7.23. No hyperkalemia or acidosis noted. No noted urine output. IR evaluated patient for placement of tunneled dialysis cath. INR is 2.27. Patient given vitamin k. Will need INR recheck subsequently. If INR appropriate then will need dialysis catheter placement for dialysis today.   MRSA Bacteremia Blood cutlures from 9/12 show no growth <24 hrs. Cath tip culture from 9/11 shows MRSA. Patient will have completed line holiday for 48 hrs as of this morning. Tunneled dialysis catheter can be placed with ID final approval and once INR decreases.   Anemia of chronic kidney disease  Continue holding epogen  Secondary hyperparathyroidism Continue cinacalcet, hold sevalamer. Phos=3.4, calcium=8.25.   LOS: 2 days   Mehtab Dolberry 09/29/2017,7:31 AM
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
## 2017-09-29 NOTE — Progress Notes (Signed)
[facility]  Date of Admission:  09/27/2017   Total days of antibiotics 9         Day 9 vancomycin           Patient ID: Perry [name] is a 47 y.o. man with  Principal Problem:   Acute bacterial endocarditis Active Problems:   MRSA bacteremia   ESRD (end stage renal disease) on dialysis (HCC)   H/O: stroke   . aspirin  81 mg Oral Daily  . [START ON 09/30/2017] Chlorhexidine Gluconate Cloth  6 each Topical Q0600  . cinacalcet  60 mg Oral QHS  . escitalopram  20 mg Oral QHS  . midodrine  15 mg Oral TID  . multivitamin  1 tablet Oral Daily  . multivitamin  1 tablet Oral QHS  . pantoprazole  20 mg Oral Daily  . vancomycin variable dose per unstable renal function (pharmacist dosing)   Does not apply See admin instructions  . vitamin A  10,000 Units Oral Daily  . ascorbic acid  250 mg Oral BID  . [START ON 09/30/2017] Vitamin D (Ergocalciferol)  50,000 Units Oral Q7 days    SUBJECTIVE: Does not contribute during exam/history.    Allergies  Allergen Reactions  . Depakote [Divalproex Sodium] Other (See Comments)    Hallucinations   . Hydromorphone Other (See Comments)    OBJECTIVE: Vitals:   09/29/17 0548 09/29/17 0909 09/29/17 1215 09/29/17 1255  BP: (!) 94/30 (!) 96/28 (!) 93/24 (!) 90/28  Pulse: 85 78 78 77  Resp: 16 18 18 20   Temp: 98.2 F (36.8 C) (!) 97.4 F (36.3 C) 98 F (36.7 C) (!) 97.5 F (36.4 C)  TempSrc: Axillary Oral Axillary Axillary  SpO2: 100% 100% 100% 100%  Weight:      Height:       Body mass index is 26.37 kg/m.  Physical Exam  Constitutional:  Resting in bed in no acute distress. Chronically ill appearing. Maintains eye contact but does not speak in response to many questions.   HENT:  Mouth/Throat: No oral lesions. Normal dentition. No dental caries.  Unwilling to open mouth   Eyes: Pupils are equal, round, and reactive to light. No scleral icterus.  Cardiovascular: Normal rate and regular  rhythm.  Murmur heard. Pulmonary/Chest: Effort normal and breath sounds normal.  Abdominal: Soft. He exhibits no distension. There is no tenderness.  Musculoskeletal: He exhibits edema (LUE > R. ).  Lymphadenopathy:    He has no cervical adenopathy.  Neurological: He is alert.  Skin: Skin is warm and dry. No rash noted.  Vitals reviewed.   Lab Results Lab Results  Component Value Date   WBC 6.5 09/29/2017   HGB 12.7 (L) 09/29/2017   HCT 42.9 09/29/2017   MCV 86.7 09/29/2017   PLT 508 (H) 09/29/2017    Lab Results  Component Value Date   CREATININE 8.67 (H) 09/29/2017   BUN 45 (H) 09/29/2017   NA 139 09/29/2017   K 4.2 09/29/2017   CL 99 09/29/2017   CO2 23 09/29/2017    Lab Results  Component Value Date   ALT 18 09/21/2017   AST 27 09/21/2017   ALKPHOS 86 09/21/2017   BILITOT 1.4 (H) 09/21/2017     Microbiology: BCx 9/05 >> MRSA  BCx 9/06 >> NG  BCx 9/12 >> NG   ASSESSMENT: Perry [name] is a 47 y.o. man with positive antiphospholipid antibody syndrome, ESRD  on HD, s/p CVA that has left him debilitated and in the care of his mother at home and now recurrent MRSA bacteremia. He is not a surgical candidate for valve replacement due to patient's preference, deconditioned status and debility from recent stroke.   He is very withdrawn and non-contributory today compared to yesterday. He has been given vitamin K for INR > 2 for replacing his perm a cath and at high risk for further embolic events with current situation anyway but also in light of recent DNR status per his request and overall deteriorating health I question if he is also experiencing some depression. Greatly appreciate Palliative Care team providing recs on symptom management for him. We need to determine where he will receive antibiotics --> he was previously undergoing in home dialysis with his mother but perhaps he may require placement for the remaining 7 weeks of treatment he has left.   Will be  available over the weekend for any ID questions.   PLAN: 1. Continue vancomycin for 8 weeks with HD --> November 1st stop date   Rexene Alberts, MSN, NP-C [facility] Infectious Disease [facility] Cell: [phone] Pager: [phone]  09/29/2017  2:11 PM

## 2017-09-29 NOTE — Progress Notes (Signed)
PROGRESS NOTE    Perry [name]  UEA:[id number] DOB: 1971-01-18 DOA: 09/27/2017 PCP: [facility]    Brief Narrative:  47 year old male with PMH notable for ESRD on HD via tunneled catheter. This was unfortunately complicated by bacteremia in April of this year. This was treated with catheter exchange and prolonged course of vancomycin. TEE was negative, but course was complicated by CVA during that admission, from which he now has residual L sided hemiparesis. He is reportedly bedbound and requires fully assisted care. He then presented to [facility] 9/11 with complaints of fevers and SOB. He was admitted and started on ABX to rule out PNA, however, blood cultures grew MRSA. He was narrowed to vancomycin. HD catheter was removed. He had TEE done 9/11 demonstrating large mitral vegetation with septal perforation tracking into the aortic valve, suspected abscess. 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 He was transferred to [facility] for CVTS evaluation.   Assessment & Plan:   Principal Problem:   Acute bacterial endocarditis Active Problems:   ESRD (end stage renal disease) on dialysis (HCC)   MRSA bacteremia   H/O: stroke  Bacteremia: secondary to MRSA mitral valve vegetation, which has likely grown into abscess. 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 NOT surgical candidate per CVTS  -  Patient is continued on IV vancomycin, anticipated at least 6 week total duration - ID is following - CT surg following  - Repeat blood cx neg x <24hrs - afebrile. No leukocytosis - Pt now pending placement of tunneled cath   Chronic hypotension - BP soft this AM - Patient is continued on midodrine  OSA on CPAP - Continue qhs CPAP as tolerated  ESRD on HD (MWF) - Nephrology following. Discussed with Dr. Ronalee Belts - Pending placement of tunneled cath with HD started afterwards per Nephrology  History of CVA April 2019: residual L sided hemiparesis. Functional quadriplegia.  -  Continue with supportive care - Appears stable at this time  Anticoagulation chronic warfarin: INR initially therapeutic, now subtherapeutic.  - INR therapeutic this AM - S/p vit K and FFP per Nephrology  DVT prophylaxis: SCD's Code Status: DNR Family Communication: Pt in room, family not at bedside Disposition Plan: Uncertain at this time  Consultants:   CCM  Nephrology  Palliative Care  ID  CTS  IR  Procedures:   TEE 9/11  Antimicrobials: Anti-infectives (From admission, onward)   Start     Dose/Rate Route Frequency Ordered Stop   09/28/17 0913  vancomycin variable dose per unstable renal function (pharmacist dosing)      Does not apply See admin instructions 09/28/17 0913         Subjective: Unable to obtain as pt not able to converse  Objective: Vitals:   09/29/17 0548 09/29/17 0909 09/29/17 1215 09/29/17 1255  BP: (!) 94/30 (!) 96/28 (!) 93/24 (!) 90/28  Pulse: 85 78 78 77  Resp: 16 18 18 20   Temp: 98.2 F (36.8 C) (!) 97.4 F (36.3 C) 98 F (36.7 C) (!) 97.5 F (36.4 C)  TempSrc: Axillary Oral Axillary Axillary  SpO2: 100% 100% 100% 100%  Weight:      Height:        Intake/Output Summary (Last 24 hours) at 09/29/2017 1324 Last data filed at 09/29/2017 0923 Gross per 24 hour  Intake 820 ml  Output 0 ml  Net 820 ml   Filed Weights   09/27/17 1830 09/28/17 0600  Weight: 74 kg 74.1 kg  Examination:  General exam: Appears calm and comfortable  Respiratory system: Clear to auscultation. Respiratory effort normal. Cardiovascular system: S1 & S2 heard, RRR Gastrointestinal system: Abdomen is nondistended, soft and nontender. No organomegaly or masses felt. Normal bowel sounds heard. Central nervous system: Asleep, arousable. No focal neurological deficits. Extremities: Symmetric 5 x 5 power. Skin: No rashes, lesions Psychiatry:Difficult to assess given current mentation   Data Reviewed: I have personally reviewed following labs and  imaging studies  CBC: Recent Labs  Lab 09/26/17 1426 09/28/17 0700 09/29/17 0358  WBC 10.6 7.8 6.5  HGB 11.5* 11.6* 12.7*  HCT 36.9* 39.9 42.9  MCV 85.3 88.1 86.7  PLT 397 483* 508*   Basic Metabolic Panel: Recent Labs  Lab 09/25/17 0443 09/26/17 1426 09/28/17 0700 09/29/17 0358  NA 140 143 139 139  K 3.3* 3.8 3.8 4.2  CL 100 98 100 99  CO2 28 27 23 23   GLUCOSE 91 101* 85 78  BUN 50* 65* 38* 45*  CREATININE 7.43* 9.26* 7.23* 8.67*  CALCIUM 9.0 8.8* 8.6* 8.5*  MG  --   --  2.3  --   PHOS  --  3.9 2.9 3.4   GFR: Estimated Creatinine Clearance: 9.6 mL/min (A) (by C-G formula based on SCr of 8.67 mg/dL (H)). Liver Function Tests: Recent Labs  Lab 09/26/17 1426 09/29/17 0358  ALBUMIN 2.4* 2.5*   No results for input(s): LIPASE, AMYLASE in the last 168 hours. No results for input(s): AMMONIA in the last 168 hours. Coagulation Profile: Recent Labs  Lab 09/25/17 0443 09/26/17 0526 09/27/17 0517 09/28/17 0700 09/29/17 0358  INR 8.45* 2.64 1.54 1.86 2.27   Cardiac Enzymes: No results for input(s): CKTOTAL, CKMB, CKMBINDEX, TROPONINI in the last 168 hours. BNP (last 3 results) No results for input(s): PROBNP in the last 8760 hours. HbA1C: No results for input(s): HGBA1C in the last 72 hours. CBG: No results for input(s): GLUCAP in the last 168 hours. Lipid Profile: No results for input(s): CHOL, HDL, LDLCALC, TRIG, CHOLHDL, LDLDIRECT in the last 72 hours. Thyroid Function Tests: No results for input(s): TSH, T4TOTAL, FREET4, T3FREE, THYROIDAB in the last 72 hours. Anemia Panel: No results for input(s): VITAMINB12, FOLATE, FERRITIN, TIBC, IRON, RETICCTPCT in the last 72 hours. Sepsis Labs: No results for input(s): PROCALCITON, LATICACIDVEN in the last 168 hours.  Recent Results (from the past 240 hour(s))  Blood Culture (routine x 2)     Status: Abnormal   Collection Time: 09/21/17  5:54 PM  Result Value Ref Range Status   Specimen Description   Final     BLOOD RIGHT HAND Performed at [facility], [address] [place], [place] [address]    Special Requests   Final    BOTTLES DRAWN AEROBIC AND ANAEROBIC Blood Culture results may not be optimal due to an excessive volume of blood received in culture bottles Performed at [facility], [address] [place], [place] [address]    Culture  Setup Time   Final    GRAM POSITIVE COCCI IN BOTH AEROBIC AND ANAEROBIC BOTTLES CRITICAL RESULT CALLED TO, READ BACK BY AND VERIFIED WITH: MATT MCBANE AT 0508 ON 09/22/17 [facility]. Performed at [facility], [address] [place], [place] [address]    Culture METHICILLIN RESISTANT STAPHYLOCOCCUS AUREUS (A)  Final   Report Status 09/24/2017 FINAL  Final   Organism ID, Bacteria METHICILLIN RESISTANT STAPHYLOCOCCUS AUREUS  Final      Susceptibility   Methicillin resistant staphylococcus aureus -  MIC*    CIPROFLOXACIN >=8 RESISTANT Resistant     ERYTHROMYCIN >=8 RESISTANT Resistant     GENTAMICIN <=0.5 SENSITIVE Sensitive     OXACILLIN >=4 RESISTANT Resistant     TETRACYCLINE <=1 SENSITIVE Sensitive     VANCOMYCIN <=0.5 SENSITIVE Sensitive     TRIMETH/SULFA <=10 SENSITIVE Sensitive     CLINDAMYCIN <=0.25 SENSITIVE Sensitive     RIFAMPIN <=0.5 SENSITIVE Sensitive     Inducible Clindamycin NEGATIVE Sensitive     * METHICILLIN RESISTANT STAPHYLOCOCCUS AUREUS  Blood Culture ID Panel (Reflexed)     Status: Abnormal   Collection Time: 09/21/17  5:54 PM  Result Value Ref Range Status   Enterococcus species NOT DETECTED NOT DETECTED Final   Listeria monocytogenes NOT DETECTED NOT DETECTED Final   Staphylococcus species DETECTED (A) NOT DETECTED Final    Comment: CRITICAL RESULT CALLED TO, READ BACK BY AND VERIFIED WITH: MATT MCBANE AT 0508 ON 09/22/17 [facility].    Staphylococcus aureus DETECTED (A) NOT DETECTED Final    Comment: Methicillin (oxacillin)-resistant Staphylococcus aureus (MRSA). MRSA is predictably resistant to beta-lactam  antibiotics (except ceftaroline). Preferred therapy is vancomycin unless clinically contraindicated. Patient requires contact precautions if  hospitalized. CRITICAL RESULT CALLED TO, READ BACK BY AND VERIFIED WITH: MATT MCBANE AT 0508 ON 09/22/17 [facility].    Methicillin resistance DETECTED (A) NOT DETECTED Final    Comment: CRITICAL RESULT CALLED TO, READ BACK BY AND VERIFIED WITH: MATT MCBANE AT 0508 ON 09/22/17 [facility].    Streptococcus species NOT DETECTED NOT DETECTED Final   Streptococcus agalactiae NOT DETECTED NOT DETECTED Final   Streptococcus pneumoniae NOT DETECTED NOT DETECTED Final   Streptococcus pyogenes NOT DETECTED NOT DETECTED Final   Acinetobacter baumannii NOT DETECTED NOT DETECTED Final   Enterobacteriaceae species NOT DETECTED NOT DETECTED Final   Enterobacter cloacae complex NOT DETECTED NOT DETECTED Final   Escherichia coli NOT DETECTED NOT DETECTED Final   Klebsiella oxytoca NOT DETECTED NOT DETECTED Final   Klebsiella pneumoniae NOT DETECTED NOT DETECTED Final   Proteus species NOT DETECTED NOT DETECTED Final   Serratia marcescens NOT DETECTED NOT DETECTED Final   Haemophilus influenzae NOT DETECTED NOT DETECTED Final   Neisseria meningitidis NOT DETECTED NOT DETECTED Final   Pseudomonas aeruginosa NOT DETECTED NOT DETECTED Final   Candida albicans NOT DETECTED NOT DETECTED Final   Candida glabrata NOT DETECTED NOT DETECTED Final   Candida krusei NOT DETECTED NOT DETECTED Final   Candida parapsilosis NOT DETECTED NOT DETECTED Final   Candida tropicalis NOT DETECTED NOT DETECTED Final    Comment: Performed at [facility], [address] [place], [place] [address]  Blood Culture (routine x 2)     Status: Abnormal   Collection Time: 09/21/17  6:05 PM  Result Value Ref Range Status   Specimen Description   Final    BLOOD RIGHT [facility] Performed at [facility], [address], [place], [place] [address]    Special Requests   Final    BOTTLES DRAWN  AEROBIC AND ANAEROBIC Blood Culture adequate volume Performed at [facility], [address] [place], [place] [address]    Culture  Setup Time   Final    GRAM POSITIVE COCCI IN BOTH AEROBIC AND ANAEROBIC BOTTLES CRITICAL RESULT CALLED TO, READ BACK BY AND VERIFIED WITH: MATT MCBANE AT 0508 ON 09/22/17 [facility]. Performed at [facility], [address] [place], [place] [address]    Culture (A)  Final    STAPHYLOCOCCUS AUREUS SUSCEPTIBILITIES  PERFORMED ON PREVIOUS CULTURE WITHIN THE LAST 5 DAYS. Performed at [facility], [address], [place], [place] [address]    Report Status 09/24/2017 FINAL  Final  MRSA PCR Screening     Status: Abnormal   Collection Time: 09/22/17 12:16 AM  Result Value Ref Range Status   MRSA by PCR POSITIVE (A) NEGATIVE Final    Comment:        The GeneXpert MRSA Assay (FDA approved for NASAL specimens only), is one component of a comprehensive MRSA colonization surveillance program. It is not intended to diagnose MRSA infection nor to guide or monitor treatment for MRSA infections. CRITICAL RESULT CALLED TO, READ BACK BY AND VERIFIED WITH: C/MARCELLA TURNER @0212  09/22/17 [facility] Performed at [facility], [address] [place], [place] [address]   CULTURE, BLOOD (ROUTINE X 2) w Reflex to ID Panel     Status: None   Collection Time: 09/22/17  9:03 AM  Result Value Ref Range Status   Specimen Description BLOOD RIGHT ANTECUBITAL  Final   Special Requests   Final    BOTTLES DRAWN AEROBIC AND ANAEROBIC Blood Culture adequate volume   Culture   Final    NO GROWTH 5 DAYS Performed at [facility], [address] [place], [place] [address]    Report Status 09/27/2017 FINAL  Final  CULTURE, BLOOD (ROUTINE X 2) w Reflex to ID Panel     Status: None   Collection Time: 09/22/17  9:03 AM  Result Value Ref Range Status   Specimen Description BLOOD BLOOD LEFT HAND  Final   Special Requests   Final    BOTTLES DRAWN  AEROBIC AND ANAEROBIC Blood Culture adequate volume   Culture   Final    NO GROWTH 5 DAYS Performed at [facility], [address], [place], [place] [address]    Report Status 09/27/2017 FINAL  Final  Cath Tip Culture     Status: Abnormal   Collection Time: 09/27/17  9:07 AM  Result Value Ref Range Status   Specimen Description CATH TIP  Final   Special Requests   Final    NONE Performed at [facility], [address], [place], [place] [address]    Culture (A)  Final    800 COLONIES/mL METHICILLIN RESISTANT STAPHYLOCOCCUS AUREUS   Report Status 09/29/2017 FINAL  Final   Organism ID, Bacteria METHICILLIN RESISTANT STAPHYLOCOCCUS AUREUS (A)  Final      Susceptibility   Methicillin resistant staphylococcus aureus - MIC*    CIPROFLOXACIN >=8 RESISTANT Resistant     ERYTHROMYCIN >=8 RESISTANT Resistant     GENTAMICIN <=0.5 SENSITIVE Sensitive     OXACILLIN >=4 RESISTANT Resistant     TETRACYCLINE <=1 SENSITIVE Sensitive     VANCOMYCIN <=0.5 SENSITIVE Sensitive     TRIMETH/SULFA <=10 SENSITIVE Sensitive     CLINDAMYCIN <=0.25 SENSITIVE Sensitive     RIFAMPIN <=0.5 SENSITIVE Sensitive     Inducible Clindamycin NEGATIVE Sensitive     * 800 COLONIES/mL METHICILLIN RESISTANT STAPHYLOCOCCUS AUREUS  Culture, blood (Routine X 2) w Reflex to ID Panel     Status: None (Preliminary result)   Collection Time: 09/28/17  7:00 AM  Result Value Ref Range Status   Specimen Description BLOOD RIGHT ARM  Final   Special Requests   Final    BOTTLES DRAWN AEROBIC ONLY Blood Culture results may not be optimal due to an inadequate volume of blood received in culture bottles   Culture  Final    NO GROWTH < 24 HOURS Performed at [facility], [address] [place], [place] [address]    Report Status PENDING  Incomplete  Culture, blood (Routine X 2) w Reflex to ID Panel     Status: None (Preliminary result)   Collection Time: 09/28/17  7:10 AM  Result Value Ref Range Status    Specimen Description BLOOD RIGHT HAND  Final   Special Requests   Final    BOTTLES DRAWN AEROBIC ONLY Blood Culture results may not be optimal due to an inadequate volume of blood received in culture bottles   Culture   Final    NO GROWTH < 24 HOURS Performed at [facility], [address] [place], [place] [address]    Report Status PENDING  Incomplete     Radiology Studies: No results found.  Scheduled Meds: . aspirin  81 mg Oral Daily  . cinacalcet  60 mg Oral QHS  . escitalopram  20 mg Oral QHS  . midodrine  15 mg Oral TID  . multivitamin  1 tablet Oral Daily  . multivitamin  1 tablet Oral QHS  . pantoprazole  20 mg Oral Daily  . vancomycin variable dose per unstable renal function (pharmacist dosing)   Does not apply See admin instructions  . vitamin A  10,000 Units Oral Daily  . ascorbic acid  250 mg Oral BID  . [START ON 09/30/2017] Vitamin D (Ergocalciferol)  50,000 Units Oral Q7 days   Continuous Infusions:   LOS: 2 days   Rickey BarbaraStephen Chiu, MD [facility] Pager On Amion  If 7PM-7AM, please contact night-coverage 09/29/2017, 1:24 PM

## 2017-09-29 NOTE — Progress Notes (Signed)
RT set up CPAP with 3L O2 bled into circuit. Patient tolerating well at this time. RT will monitor patient as needed.

## 2017-09-29 NOTE — Progress Notes (Signed)
Pharmacy Antibiotic Note  Perry [name] is a 47 y.o. male admitted on 09/27/2017 with bacteremia.  Pharmacy has been consulted for vancomycin dosing.  [facility]. Known HD patient with hx of bacteremia in April (underwent catheter exchange/treatment with vanc). Presented with SOB and fevers - BCx grew MRSA. TEE noting large vegetation at base of anterior mitral leaflet with suspected abscess. Received loading dose of vancomycin 1500 mg on 9/5 and maintenance dose of 1 g on 9/6 + 9/10.   Vancomycin random was 35 today.  Assuming well tolerated dialysis of 4 hours at BFR of 350 to 400 today, patient's level will still be too high after HD to redose. Will wait to redose after HD with next HD.     Plan: Hold vancomycin maintenance dosing of 1000 mg IV with HD Monitor cx results, clinical pic, CT surgery plans, and HD schedule  Height: 5\' 6"  (167.6 cm) Weight: 163 lb 5.8 oz (74.1 kg) IBW/kg (Calculated) : 63.8  Temp (24hrs), Avg:97.9 F (36.6 C), Min:97.4 F (36.3 C), Max:98.6 F (37 C)  Recent Labs  Lab 09/25/17 0443 09/26/17 1426 09/27/17 0517 09/28/17 0700 09/29/17 0358 09/29/17 0833  WBC  --  10.6  --  7.8 6.5  --   CREATININE 7.43* 9.26*  --  7.23* 8.67*  --   VANCORANDOM  --   --  39  --   --  35    Estimated Creatinine Clearance: 9.6 mL/min (A) (by C-G formula based on SCr of 8.67 mg/dL (H)).    Allergies  Allergen Reactions  . Depakote [Divalproex Sodium] Other (See Comments)    Hallucinations   . Hydromorphone Other (See Comments)    Antimicrobials this admission: Vancomycin 9/11 >>   Dose adjustments this admission:  VR 9/11: 39 >> hold maintenance dosing VR 9/13: 35 >> hold maintenance dosing  Microbiology results: 9/5 BCx: MRSA 9/6 Bcx: NGTD 9/6 MRSA PCR: pos  Kacey Dysert A. Jeanella CrazePierce, PharmD, BCPS Clinical Pharmacist [place] Pager: [phone] Please utilize Amion for appropriate phone number to reach the unit pharmacist [facility])   09/29/2017 12:09 PM

## 2017-09-29 NOTE — Care Management Note (Signed)
Case Management Note  Patient Details  Name: Perry [name] MRN: [id number] Date of Birth: 03/14/1970  Subjective/Objective:    Acute Bacterial Endocarditis               Action/Plan: Patient lives at home with his mother; PRIMARY CARE PHYSICIAN: [facility]; has private insurance with Medicare; [facility] as prior to admission; DME - hosp bed. Lift chair, wheel chair; CM will continue to follow for progression of care.  Expected Discharge Date:      Possibly 10/04/2017            Expected Discharge Plan:  Home w Home Health Services  Discharge planning Services  CM Consult   HH Arranged:   [facility]HR[facility],PT, aide [facility]  Status of Service:  In process, will continue to follow  Perry MosherChandler, Taje Tondreau L, RN,MHA,BSN [phone] 09/29/2017, 11:47 AM

## 2017-09-29 NOTE — Plan of Care (Signed)
  Problem: Education: Goal: Knowledge of General Education information will improve Description Including pain rating scale, medication(s)/side effects and non-pharmacologic comfort measures Outcome: Progressing   Problem: Health Behavior/Discharge Planning: Goal: Ability to manage health-related needs will improve Outcome: Progressing   Problem: Clinical Measurements: Goal: Ability to maintain clinical measurements within normal limits will improve Outcome: Progressing Goal: Will remain free from infection Outcome: Progressing Goal: Diagnostic test results will improve Outcome: Progressing Goal: Respiratory complications will improve Outcome: Progressing Goal: Cardiovascular complication will be avoided Outcome: Progressing   Problem: Activity: Goal: Risk for activity intolerance will decrease Outcome: Progressing   Problem: Nutrition: Goal: Adequate nutrition will be maintained Outcome: Progressing   Problem: Coping: Goal: Level of anxiety will decrease Outcome: Progressing   Problem: Elimination: Goal: Will not experience complications related to bowel motility Outcome: Progressing Goal: Will not experience complications related to urinary retention Outcome: Progressing   Problem: Pain Managment: Goal: General experience of comfort will improve Outcome: Progressing   Problem: Safety: Goal: Ability to remain free from injury will improve Outcome: Progressing   Problem: Skin Integrity: Goal: Risk for impaired skin integrity will decrease Outcome: Progressing   Problem: Education: Goal: Knowledge of disease and its progression will improve Outcome: Progressing Goal: Individualized Educational Video(s) Outcome: Progressing   Problem: Fluid Volume: Goal: Compliance with measures to maintain balanced fluid volume will improve Outcome: Progressing   Problem: Health Behavior/Discharge Planning: Goal: Ability to manage health-related needs will  improve Outcome: Progressing   Problem: Clinical Measurements: Goal: Complications related to the disease process, condition or treatment will be avoided or minimized Outcome: Progressing

## 2017-09-30 ENCOUNTER — Encounter (HOSPITAL_COMMUNITY): Payer: Self-pay | Admitting: Interventional Radiology

## 2017-09-30 ENCOUNTER — Inpatient Hospital Stay (HOSPITAL_COMMUNITY): Payer: Medicare Other

## 2017-09-30 HISTORY — PX: IR FLUORO GUIDE CV LINE LEFT: IMG2282

## 2017-09-30 HISTORY — PX: IR US GUIDE VASC ACCESS LEFT: IMG2389

## 2017-09-30 LAB — PREPARE FRESH FROZEN PLASMA
UNIT DIVISION: 0
Unit division: 0

## 2017-09-30 LAB — BPAM FFP
Blood Product Expiration Date: 201909172359
Blood Product Expiration Date: 201909172359
ISSUE DATE / TIME: 201909131222
ISSUE DATE / TIME: 201909131629
UNIT TYPE AND RH: 5100
Unit Type and Rh: 5100

## 2017-09-30 LAB — RENAL FUNCTION PANEL
ALBUMIN: 2.6 g/dL — AB (ref 3.5–5.0)
Anion gap: 20 — ABNORMAL HIGH (ref 5–15)
BUN: 56 mg/dL — AB (ref 6–20)
CO2: 23 mmol/L (ref 22–32)
CREATININE: 10.9 mg/dL — AB (ref 0.61–1.24)
Calcium: 8.3 mg/dL — ABNORMAL LOW (ref 8.9–10.3)
Chloride: 96 mmol/L — ABNORMAL LOW (ref 98–111)
GFR calc Af Amer: 6 mL/min — ABNORMAL LOW (ref >60)
GFR, EST NON AFRICAN AMERICAN: 5 mL/min — AB (ref >60)
Glucose, Bld: 108 mg/dL — ABNORMAL HIGH (ref 70–99)
PHOSPHORUS: 3.8 mg/dL (ref 2.5–4.6)
Potassium: 3.4 mmol/L — ABNORMAL LOW (ref 3.5–5.1)
Sodium: 139 mmol/L (ref 135–145)

## 2017-09-30 LAB — CBC
HCT: 38.8 % — ABNORMAL LOW (ref 39.0–52.0)
Hemoglobin: 11.3 g/dL — ABNORMAL LOW (ref 13.0–17.0)
MCH: 26 pg (ref 26.0–34.0)
MCHC: 29.1 g/dL — AB (ref 30.0–36.0)
MCV: 89.4 fL (ref 78.0–100.0)
PLATELETS: 570 10*3/uL — AB (ref 150–400)
RBC: 4.34 MIL/uL (ref 4.22–5.81)
RDW: 23.1 % — AB (ref 11.5–15.5)
WBC: 8.2 10*3/uL (ref 4.0–10.5)

## 2017-09-30 LAB — PROTIME-INR
INR: 1.4
PROTHROMBIN TIME: 17 s — AB (ref 11.4–15.2)

## 2017-09-30 MED ORDER — CEFAZOLIN SODIUM-DEXTROSE 2-4 GM/100ML-% IV SOLN
INTRAVENOUS | Status: AC
  Filled 2017-09-30: qty 100

## 2017-09-30 MED ORDER — SODIUM CHLORIDE 0.9 % IV SOLN: 100.0000 mL | INTRAVENOUS | Status: DC | PRN

## 2017-09-30 MED ORDER — LIDOCAINE-EPINEPHRINE (PF) 1 %-1:200000 IJ SOLN
INTRAMUSCULAR | Status: AC
  Filled 2017-09-30: qty 30

## 2017-09-30 MED ORDER — FENTANYL CITRATE (PF) 100 MCG/2ML IJ SOLN
INTRAMUSCULAR | Status: AC
  Filled 2017-09-30: qty 2

## 2017-09-30 MED ORDER — FENTANYL CITRATE (PF) 100 MCG/2ML IJ SOLN
INTRAMUSCULAR | Status: AC | PRN
  Administered 2017-09-30: 50 ug via INTRAVENOUS

## 2017-09-30 MED ORDER — CEFAZOLIN SODIUM-DEXTROSE 1-4 GM/50ML-% IV SOLN
INTRAVENOUS | Status: AC | PRN
  Administered 2017-09-30: 2 mg via INTRAVENOUS

## 2017-09-30 MED ORDER — HEPARIN SODIUM (PORCINE) 1000 UNIT/ML DIALYSIS: 20.0000 [IU]/kg | INTRAMUSCULAR | Status: DC | PRN

## 2017-09-30 MED ORDER — LIDOCAINE-EPINEPHRINE (PF) 1 %-1:200000 IJ SOLN
INTRAMUSCULAR | Status: AC | PRN
  Administered 2017-09-30: 10 mL

## 2017-09-30 MED ORDER — MIDAZOLAM HCL 2 MG/2ML IJ SOLN
INTRAMUSCULAR | Status: AC
  Filled 2017-09-30: qty 4

## 2017-09-30 MED ORDER — MIDAZOLAM HCL 2 MG/2ML IJ SOLN
INTRAMUSCULAR | Status: AC | PRN
  Administered 2017-09-30: 1 mg via INTRAVENOUS

## 2017-09-30 MED ORDER — IOPAMIDOL (ISOVUE-300) INJECTION 61%
INTRAVENOUS | Status: AC
  Administered 2017-09-30: 10 mL
  Filled 2017-09-30: qty 50

## 2017-09-30 MED ORDER — ALTEPLASE 2 MG IJ SOLR: 2.0000 mg | Freq: Once | INTRAMUSCULAR | Status: DC | PRN

## 2017-09-30 MED ORDER — LIDOCAINE HCL 1 % IJ SOLN
INTRAMUSCULAR | Status: AC
  Filled 2017-09-30: qty 20

## 2017-09-30 MED ORDER — HEPARIN SODIUM (PORCINE) 1000 UNIT/ML IJ SOLN
INTRAMUSCULAR | Status: AC
  Administered 2017-09-30: 3.8 [IU]
  Filled 2017-09-30: qty 1

## 2017-09-30 MED ORDER — HEPARIN SODIUM (PORCINE) 1000 UNIT/ML DIALYSIS: 1000.0000 [IU] | INTRAMUSCULAR | Status: DC | PRN

## 2017-09-30 NOTE — Sedation Documentation (Signed)
SBAR called to Aon CorporationWendi, Charity fundraiserN.

## 2017-09-30 NOTE — Progress Notes (Signed)
PROGRESS NOTE    Perry [name]  ZOX:[id number] DOB: [birth year] DOA: 09/27/2017 PCP: [facility]    Brief Narrative:  47 year old male with PMH notable for ESRD on HD via tunneled catheter. This was unfortunately complicated by bacteremia in April of this year. This was treated with catheter exchange and prolonged course of vancomycin. TEE was negative, but course was complicated by CVA during that admission, from which he now has residual L sided hemiparesis. He is reportedly bedbound and requires fully assisted care. He then presented to [facility] 9/11 with complaints of fevers and SOB. He was admitted and started on ABX to rule out PNA, however, blood cultures grew MRSA. He was narrowed to vancomycin. HD catheter was removed. He had TEE done 9/11 demonstrating large mitral vegetation with septal perforation tracking into the aortic valve, suspected abscess. 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 He was transferred to [facility] for CVTS evaluation.   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 NOT surgical candidate per CVTS  -  Patient is continued on IV vancomycin, anticipated at least 6 week total duration - ID continues to follow - CT surgery had seen in consultation, not surgical candidate  - Repeat blood cx neg noted - afebrile. No leukocytosis - Pt now pending placement of tunneled cath, IR is aware   Chronic hypotension - BP stable this AM - Patient is continued on midodrine  OSA on CPAP - Continue qhs CPAP as patient tolerates  ESRD on HD (MWF) - Nephrology following. Discussed with Dr. Ronalee BeltsBhandari - Pending placement of tunneled cath with HD started  afterwards per Nephrology -Anticipating IR for line placement today with HD afterwards  History of CVA April 2019: residual L sided hemiparesis. Functional quadriplegia.  - Continue with supportive care - conversant. Appears stable  Anticoagulation chronic warfarin: INR initially therapeutic, now subtherapeutic.  - INR now <2 -cont to monitor  DVT prophylaxis: SCD's Code Status: DNR Family Communication: Pt in room, family not at bedside Disposition Plan: Uncertain at this time  Consultants:   CCM  Nephrology  Palliative Care  ID  CTS  IR  Procedures:   TEE 9/11  Antimicrobials: Anti-infectives (From admission, onward)   Start     Dose/Rate Route Frequency Ordered Stop   09/28/17 0913  vancomycin variable dose per unstable renal function (pharmacist dosing)      Does not apply See admin instructions 09/28/17 0913        Subjective: Eager to resume eating  Objective: Vitals:   09/29/17 2042 09/29/17 2217 09/30/17 0437 09/30/17 0950  BP: (!) 86/31  (!) 106/33 (!) 107/36  Pulse: 79 80 86 83  Resp: 18 16 16 18   Temp: 97.6 F (36.4 C)  (!) 97.5 F (36.4 C) (!) 97.5 F (36.4 C)  TempSrc: Oral  Oral Oral  SpO2: 100% 99% 94% (!) 89%  Weight: 81.1 kg     Height:        Intake/Output Summary (Last 24 hours) at 09/30/2017 1406 Last data filed at 09/30/2017 0900 Gross per 24 hour  Intake 583 ml  Output 0 ml  Net 583 ml   Filed Weights   09/27/17 1830 09/28/17 0600  09/29/17 2042  Weight: 74 kg 74.1 kg 81.1 kg    Examination: General exam: Conversant, in no acute distress Respiratory system: normal chest rise, clear, no audible wheezing Cardiovascular system: regular rhythm, s1-s2 Gastrointestinal system: Nondistended, nontender, pos BS Central nervous system: No seizures, no tremors Extremities: No cyanosis, no joint deformities Skin: No rashes, no pallor Psychiatry: Affect normal // no auditory hallucinations   Data Reviewed: I have personally  reviewed following labs and imaging studies  CBC: Recent Labs  Lab 09/26/17 1426 09/28/17 0700 09/29/17 0358  WBC 10.6 7.8 6.5  HGB 11.5* 11.6* 12.7*  HCT 36.9* 39.9 42.9  MCV 85.3 88.1 86.7  PLT 397 483* 508*   Basic Metabolic Panel: Recent Labs  Lab 09/25/17 0443 09/26/17 1426 09/28/17 0700 09/29/17 0358  NA 140 143 139 139  K 3.3* 3.8 3.8 4.2  CL 100 98 100 99  CO2 28 27 23 23   GLUCOSE 91 101* 85 78  BUN 50* 65* 38* 45*  CREATININE 7.43* 9.26* 7.23* 8.67*  CALCIUM 9.0 8.8* 8.6* 8.5*  MG  --   --  2.3  --   PHOS  --  3.9 2.9 3.4   GFR: Estimated Creatinine Clearance: 10.6 mL/min (A) (by C-G formula based on SCr of 8.67 mg/dL (H)). Liver Function Tests: Recent Labs  Lab 09/26/17 1426 09/29/17 0358  ALBUMIN 2.4* 2.5*   No results for input(s): LIPASE, AMYLASE in the last 168 hours. No results for input(s): AMMONIA in the last 168 hours. Coagulation Profile: Recent Labs  Lab 09/27/17 0517 09/28/17 0700 09/29/17 0358 09/29/17 1355 09/30/17 0253  INR 1.54 1.86 2.27 2.39 1.40   Cardiac Enzymes: No results for input(s): CKTOTAL, CKMB, CKMBINDEX, TROPONINI in the last 168 hours. BNP (last 3 results) No results for input(s): PROBNP in the last 8760 hours. HbA1C: No results for input(s): HGBA1C in the last 72 hours. CBG: No results for input(s): GLUCAP in the last 168 hours. Lipid Profile: No results for input(s): CHOL, HDL, LDLCALC, TRIG, CHOLHDL, LDLDIRECT in the last 72 hours. Thyroid Function Tests: No results for input(s): TSH, T4TOTAL, FREET4, T3FREE, THYROIDAB in the last 72 hours. Anemia Panel: No results for input(s): VITAMINB12, FOLATE, FERRITIN, TIBC, IRON, RETICCTPCT in the last 72 hours. Sepsis Labs: No results for input(s): PROCALCITON, LATICACIDVEN in the last 168 hours.  Recent Results (from the past 240 hour(s))  Blood Culture (routine x 2)     Status: Abnormal   Collection Time: 09/21/17  5:54 PM  Result Value Ref Range Status    Specimen Description   Final    BLOOD RIGHT HAND Performed at [facility], [address] [place], [place] [address]    Special Requests   Final    BOTTLES DRAWN AEROBIC AND ANAEROBIC Blood Culture results may not be optimal due to an excessive volume of blood received in culture bottles Performed at [facility], [address], [place], [place] [address]    Culture  Setup Time   Final    GRAM POSITIVE COCCI IN BOTH AEROBIC AND ANAEROBIC BOTTLES CRITICAL RESULT CALLED TO, READ BACK BY AND VERIFIED WITH: MATT MCBANE AT 0508 ON 09/22/17 [facility]. Performed at [facility], [address] [place], [place] [address]    Culture METHICILLIN RESISTANT STAPHYLOCOCCUS AUREUS (A)  Final   Report Status 09/24/2017 FINAL  Final   Organism ID, Bacteria METHICILLIN RESISTANT STAPHYLOCOCCUS AUREUS  Final      Susceptibility   Methicillin resistant staphylococcus aureus -  MIC*    CIPROFLOXACIN >=8 RESISTANT Resistant     ERYTHROMYCIN >=8 RESISTANT Resistant     GENTAMICIN <=0.5 SENSITIVE Sensitive     OXACILLIN >=4 RESISTANT Resistant     TETRACYCLINE <=1 SENSITIVE Sensitive     VANCOMYCIN <=0.5 SENSITIVE Sensitive     TRIMETH/SULFA <=10 SENSITIVE Sensitive     CLINDAMYCIN <=0.25 SENSITIVE Sensitive     RIFAMPIN <=0.5 SENSITIVE Sensitive     Inducible Clindamycin NEGATIVE Sensitive     * METHICILLIN RESISTANT STAPHYLOCOCCUS AUREUS  Blood Culture ID Panel (Reflexed)     Status: Abnormal   Collection Time: 09/21/17  5:54 PM  Result Value Ref Range Status   Enterococcus species NOT DETECTED NOT DETECTED Final   Listeria monocytogenes NOT DETECTED NOT DETECTED Final   Staphylococcus species DETECTED (A) NOT DETECTED Final    Comment: CRITICAL RESULT CALLED TO, READ BACK BY AND VERIFIED WITH: MATT MCBANE AT 0508 ON 09/22/17 [facility].    Staphylococcus aureus DETECTED (A) NOT DETECTED Final    Comment: Methicillin (oxacillin)-resistant Staphylococcus aureus (MRSA). MRSA is  predictably resistant to beta-lactam antibiotics (except ceftaroline). Preferred therapy is vancomycin unless clinically contraindicated. Patient requires contact precautions if  hospitalized. CRITICAL RESULT CALLED TO, READ BACK BY AND VERIFIED WITH: MATT MCBANE AT 0508 ON 09/22/17 [facility].    Methicillin resistance DETECTED (A) NOT DETECTED Final    Comment: CRITICAL RESULT CALLED TO, READ BACK BY AND VERIFIED WITH: MATT MCBANE AT 0508 ON 09/22/17 [facility].    Streptococcus species NOT DETECTED NOT DETECTED Final   Streptococcus agalactiae NOT DETECTED NOT DETECTED Final   Streptococcus pneumoniae NOT DETECTED NOT DETECTED Final   Streptococcus pyogenes NOT DETECTED NOT DETECTED Final   Acinetobacter baumannii NOT DETECTED NOT DETECTED Final   Enterobacteriaceae species NOT DETECTED NOT DETECTED Final   Enterobacter cloacae complex NOT DETECTED NOT DETECTED Final   Escherichia coli NOT DETECTED NOT DETECTED Final   Klebsiella oxytoca NOT DETECTED NOT DETECTED Final   Klebsiella pneumoniae NOT DETECTED NOT DETECTED Final   Proteus species NOT DETECTED NOT DETECTED Final   Serratia marcescens NOT DETECTED NOT DETECTED Final   Haemophilus influenzae NOT DETECTED NOT DETECTED Final   Neisseria meningitidis NOT DETECTED NOT DETECTED Final   Pseudomonas aeruginosa NOT DETECTED NOT DETECTED Final   Candida albicans NOT DETECTED NOT DETECTED Final   Candida glabrata NOT DETECTED NOT DETECTED Final   Candida krusei NOT DETECTED NOT DETECTED Final   Candida parapsilosis NOT DETECTED NOT DETECTED Final   Candida tropicalis NOT DETECTED NOT DETECTED Final    Comment: Performed at [facility], [address] [place], [place] [address]  Blood Culture (routine x 2)     Status: Abnormal   Collection Time: 09/21/17  6:05 PM  Result Value Ref Range Status   Specimen Description   Final    BLOOD RIGHT [facility] Performed at [facility], [address], [place], [place] [address]    Special  Requests   Final    BOTTLES DRAWN AEROBIC AND ANAEROBIC Blood Culture adequate volume Performed at [facility], [address] [place], [place] [address]    Culture  Setup Time   Final    GRAM POSITIVE COCCI IN BOTH AEROBIC AND ANAEROBIC BOTTLES CRITICAL RESULT CALLED TO, READ BACK BY AND VERIFIED WITH: MATT MCBANE AT 0508 ON 09/22/17 [facility]. Performed at [facility], [address], [place], [place] [address]    Culture (A)  Final    STAPHYLOCOCCUS AUREUS SUSCEPTIBILITIES  PERFORMED ON PREVIOUS CULTURE WITHIN THE LAST 5 DAYS. Performed at [facility], [address] [place] [place], [place] [address]    Report Status 09/24/2017 FINAL  Final  MRSA PCR Screening     Status: Abnormal   Collection Time: 09/22/17 12:16 AM  Result Value Ref Range Status   MRSA by PCR POSITIVE (A) NEGATIVE Final    Comment:        The GeneXpert MRSA Assay (FDA approved for NASAL specimens only), is one component of a comprehensive MRSA colonization surveillance program. It is not intended to diagnose MRSA infection nor to guide or monitor treatment for MRSA infections. CRITICAL RESULT CALLED TO, READ BACK BY AND VERIFIED WITH: C/MARCELLA TURNER @0212  09/22/17 [facility] Performed at [facility], [address] [place], [place] [address]   CULTURE, BLOOD (ROUTINE X 2) w Reflex to ID Panel     Status: None   Collection Time: 09/22/17  9:03 AM  Result Value Ref Range Status   Specimen Description BLOOD RIGHT ANTECUBITAL  Final   Special Requests   Final    BOTTLES DRAWN AEROBIC AND ANAEROBIC Blood Culture adequate volume   Culture   Final    NO GROWTH 5 DAYS Performed at [facility], [address] [place], [place] [address]    Report Status 09/27/2017 FINAL  Final  CULTURE, BLOOD (ROUTINE X 2) w Reflex to ID Panel     Status: None   Collection Time: 09/22/17  9:03 AM  Result Value Ref Range Status   Specimen Description BLOOD BLOOD LEFT HAND  Final   Special  Requests   Final    BOTTLES DRAWN AEROBIC AND ANAEROBIC Blood Culture adequate volume   Culture   Final    NO GROWTH 5 DAYS Performed at [facility], [address] [place], [place] [address]    Report Status 09/27/2017 FINAL  Final  Cath Tip Culture     Status: Abnormal   Collection Time: 09/27/17  9:07 AM  Result Value Ref Range Status   Specimen Description CATH TIP  Final   Special Requests   Final    NONE Performed at [facility], [address], [place], [place] [address]    Culture (A)  Final    800 COLONIES/mL METHICILLIN RESISTANT STAPHYLOCOCCUS AUREUS   Report Status 09/29/2017 FINAL  Final   Organism ID, Bacteria METHICILLIN RESISTANT STAPHYLOCOCCUS AUREUS (A)  Final      Susceptibility   Methicillin resistant staphylococcus aureus - MIC*    CIPROFLOXACIN >=8 RESISTANT Resistant     ERYTHROMYCIN >=8 RESISTANT Resistant     GENTAMICIN <=0.5 SENSITIVE Sensitive     OXACILLIN >=4 RESISTANT Resistant     TETRACYCLINE <=1 SENSITIVE Sensitive     VANCOMYCIN <=0.5 SENSITIVE Sensitive     TRIMETH/SULFA <=10 SENSITIVE Sensitive     CLINDAMYCIN <=0.25 SENSITIVE Sensitive     RIFAMPIN <=0.5 SENSITIVE Sensitive     Inducible Clindamycin NEGATIVE Sensitive     * 800 COLONIES/mL METHICILLIN RESISTANT STAPHYLOCOCCUS AUREUS  Culture, blood (Routine X 2) w Reflex to ID Panel     Status: None (Preliminary result)   Collection Time: 09/28/17  7:00 AM  Result Value Ref Range Status   Specimen Description BLOOD RIGHT ARM  Final   Special Requests   Final    BOTTLES DRAWN AEROBIC ONLY Blood Culture results may not be optimal due to an inadequate volume of blood received in culture bottles   Culture  Final    NO GROWTH 2 DAYS Performed at [facility], [address] [place], [place] [address]    Report Status PENDING  Incomplete  Culture, blood (Routine X 2) w Reflex to ID Panel     Status: None (Preliminary result)   Collection Time: 09/28/17  7:10 AM    Result Value Ref Range Status   Specimen Description BLOOD RIGHT HAND  Final   Special Requests   Final    BOTTLES DRAWN AEROBIC ONLY Blood Culture results may not be optimal due to an inadequate volume of blood received in culture bottles   Culture   Final    NO GROWTH 2 DAYS Performed at [facility], [address], [place], [place] [address]    Report Status PENDING  Incomplete     Radiology Studies: No results found.  Scheduled Meds: . aspirin  81 mg Oral Daily  . Chlorhexidine Gluconate Cloth  6 each Topical Q0600  . cinacalcet  60 mg Oral QHS  . escitalopram  20 mg Oral QHS  . midodrine  15 mg Oral TID  . multivitamin  1 tablet Oral Daily  . multivitamin  1 tablet Oral QHS  . pantoprazole  20 mg Oral Daily  . vancomycin variable dose per unstable renal function (pharmacist dosing)   Does not apply See admin instructions  . vitamin A  10,000 Units Oral Daily  . ascorbic acid  250 mg Oral BID  . Vitamin D (Ergocalciferol)  50,000 Units Oral Q7 days   Continuous Infusions:   LOS: 3 days   Rickey Barbara, MD [facility] Pager On Amion  If 7PM-7AM, please contact night-coverage 09/30/2017, 2:06 PM

## 2017-09-30 NOTE — Progress Notes (Signed)
Daily Progress Note   Patient Name: Perry [name]       Date: 09/30/2017 DOB: [birth year]  Age: 47 y.o. MRN#: [id number] Attending Physician: Donne Hazel, MD Primary Care Physician: [facility] [place] Date: 09/27/2017  Reason for Consultation/Follow-up: Establishing goals of care and Psychosocial/spiritual support  Subjective: Patient seen, chart reviewed.  Appointment scheduled with patient's mother, Karsten Ro, at 89 but she was unable to make this appointment.  Patient himself is much more alert today, conversant.  He denies pain or shortness of breath.  He understands that there is something wrong with his heart but I do not know at this point if he appreciates how ill he is.  We did talk a lot about what dialysis has been like for him as well as engaged in life review.  He states he looks at dialysis as his "part-time job".  I did ask him if it was difficult to make the decision for hemodialysis and he stated "no, not when they tell you it's this or you are going to die".  I shared with him the treatment plan at this point is for approximately 7 to 8 weeks of antibiotics.  He is not sure whether this is something that his mother will be able to handle at home.  He feels like she has a lot on her already.  Patient did clarify for me that he no longer does hemodialysis at home but does go to a dialysis center.  Despite his stroke in April and needing assistance, thus far, he is been able to tolerate sitting up in a chair for 4 hours at a time 3 times a week  Length of Stay: 3  Current Medications: Scheduled Meds:  . aspirin  81 mg Oral Daily  . Chlorhexidine Gluconate Cloth  6 each Topical Q0600  . cinacalcet  60 mg Oral QHS  . escitalopram  20 mg Oral QHS  .  midodrine  15 mg Oral TID  . multivitamin  1 tablet Oral Daily  . multivitamin  1 tablet Oral QHS  . pantoprazole  20 mg Oral Daily  . vancomycin variable dose per unstable renal function (pharmacist dosing)   Does not apply See admin instructions  . vitamin A  10,000 Units Oral Daily  . ascorbic acid  250 mg  Oral BID  . Vitamin D (Ergocalciferol)  50,000 Units Oral Q7 days    Continuous Infusions:   PRN Meds: RESOURCE THICKENUP CLEAR  Physical Exam  Constitutional: He is oriented to person, place, and time. He appears well-developed and well-nourished.  Patient is alert, conversant, no acute distress noted  HENT:  Head: Normocephalic and atraumatic.  Cardiovascular: Normal rate.  Pulmonary/Chest: Effort normal.  Musculoskeletal:  Left hemiplegia  Neurological: He is alert and oriented to person, place, and time.  Skin: Skin is warm and dry.  Psychiatric: He has a normal mood and affect. His behavior is normal. Thought content normal.  Nursing note and vitals reviewed.           Vital Signs: BP (!) 107/36 (BP Location: Right Arm)   Pulse 83   Temp (!) 97.5 F (36.4 C) (Oral)   Resp 18   Ht 5' 6" (1.676 m)   Wt 81.1 kg   SpO2 (!) 89%   BMI 28.86 kg/m  SpO2: SpO2: (!) 89 % O2 Device: O2 Device: Room Air O2 Flow Rate: O2 Flow Rate (L/min): 2.5 L/min  Intake/output summary:   Intake/Output Summary (Last 24 hours) at 09/30/2017 1248 Last data filed at 09/30/2017 0900 Gross per 24 hour  Intake 583 ml  Output 0 ml  Net 583 ml   LBM: Last BM Date: 09/30/17 Baseline Weight: Weight: 74 kg Most recent weight: Weight: 81.1 kg       Palliative Assessment/Data:    Flowsheet Rows     Most Recent Value  Intake Tab  Referral Department  Hospitalist  Unit at Time of Referral  Med/Surg Unit  Palliative Care Primary Diagnosis  Sepsis/Infectious Disease  Date Notified  09/28/17  Palliative Care Type  New Palliative care  Reason for referral  Clarify Goals of Care    Date of Admission  09/27/17  Date first seen by Palliative Care  09/29/17  # of days Palliative referral response time  1 Day(s)  # of days IP prior to Palliative referral  1  Clinical Assessment  Palliative Performance Scale Score  30%  Pain Max last 24 hours  Not able to report  Pain Min Last 24 hours  Not able to report  Dyspnea Max Last 24 Hours  Not able to report  Dyspnea Min Last 24 hours  Not able to report  Nausea Max Last 24 Hours  Not able to report  Nausea Min Last 24 Hours  Not able to report  Anxiety Max Last 24 Hours  Not able to report  Anxiety Min Last 24 Hours  Not able to report  Other Max Last 24 Hours  Not able to report  Psychosocial & Spiritual Assessment  Palliative Care Outcomes  Patient/Family meeting held?  Yes  Who was at the meeting?  mother  Patient/Family wishes: Interventions discontinued/not started   Mechanical Ventilation  Palliative Care follow-up planned  Yes, Facility      Patient Active Problem List   Diagnosis Date Noted  . Acute bacterial endocarditis   . MRSA bacteremia   . H/O: stroke   . Pressure injury of skin 09/22/2017  . Febrile illness 09/21/2017  . CVA (cerebral vascular accident) ([place]) 08/27/2017  . Hallucinations   . Goals of care, counseling/discussion   . Palliative care by specialist   . End stage renal disease ([place])   . Nonrheumatic aortic valve insufficiency   . Sepsis ([place]) 08/02/2017  . Solitary pulmonary nodule 05/08/2015  . CHF (congestive heart  failure) ([place]) 05/06/2015  . Hyperkalemia 05/06/2015  . Acute encephalopathy 05/06/2015  . OSA on CPAP 05/06/2015  . Morbid obesity ([place]) 05/06/2015  . Fracture of transverse process of thoracic vertebra (HCC)   . 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 Enteritis due to Clostridium difficile   . Hypernatremia   . Convulsion ([place])   . ESRD (end stage renal disease) ([place]) 08/07/2014  . Altered mental status 08/07/2014  . 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 Epigastric abdominal pain   . Abdominal pain, epigastric 11/17/2013  . Hypotension 11/17/2013  . Abdominal pain 11/16/2013  . Seizures ([place]) 06/06/2012  . ESRD (end stage renal disease) on dialysis ([place]) 06/06/2012  . Anemia of chronic disease 06/06/2012  . Secondary hyperparathyroidism (of renal origin) 06/06/2012  . HTN (hypertension) 06/06/2012  . 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  He was started on antibiotics to rule out pneumonia.  Blood cultures grew MRSA..  Dialysis catheter was removed.  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  He was transferred to [facility] for CVTS evaluation. Patient has been seen by CVTS and is deemed not an operative candidate secondary to comorbidities of end-stage renal disease, hypotension.  Consult ordered for goals of care.  Patient's mother, caregiver, did not present for appointment scheduled on 09/30/2017 at 35.   Recommendations/Plan:  Met with patient on 9/14,  and briefly spoke  with his mother on 09/29/2017, stopping hemodialysis is not something that he would pursue.  He does seem to recognize that there may be a point where he will have to stop this but it does not strike me that he would volitionally want to stop hemodialysis because of perceived burden  He wants to do antibiotics and what ever he can do to extend his life.  Conversations need to be held with patient's mother/caregiver, to find out if IV antibiotics in the home versus SNF would be the disposition  Likely, ongoing goals of care will occur in an  outpatient setting; palliative care services can be accessed in the community through either [facility] at [phone]; [facility] palliative medicine division at [phone]  Goals of Care and Additional Recommendations:  Limitations on Scope of Treatment: Full Scope Treatment  Code Status:    Code Status Orders  (From admission, onward)         Start     Ordered   09/27/17 2005  Do not attempt resuscitation (DNR)  Continuous    Question Answer Comment  In the event of cardiac or respiratory ARREST Do not call a "code blue"   In the event of cardiac or respiratory ARREST Do not perform Intubation, CPR, defibrillation or ACLS   In the event of cardiac or respiratory ARREST Use medication by any route, position, wound care, and other measures to relive pain and suffering. May use oxygen, suction and manual treatment of airway obstruction as needed for comfort.   Comments Nurse may pronounce      09/27/17 2010        Code Status History    Date  Active Date Inactive Code Status Order ID Comments User Context   09/21/2017 2126 09/27/2017 1814 DNR [id number]  Fritzi Mandes, MD Inpatient   08/07/2017 1353 08/11/2017 2024 DNR [id number]  Gorden Harms, MD Inpatient   08/02/2017 2240 08/07/2017 1353 Full Code [id number]  Amelia Jo, MD Inpatient   07/27/2017 1249 08/02/2017 1940 Full Code [id number]  Fritzi Mandes, MD Inpatient   07/11/2016 2158 07/12/2016 1509 Full Code [id number]  Fritzi Mandes, MD Inpatient   05/06/2015 1126 05/08/2015 1456 Full Code [id number]  Kathie Dike, MD Inpatient   08/07/2014 1858 08/11/2014 1805 Full Code [id number]  Vivi Barrack, MD Inpatient   05/05/2014 1632 05/07/2014 1405 Full Code [id number]  Johnathan Hausen, MD Inpatient   11/16/2013 2301 11/17/2013 1724 Full Code [id number]  Truett Mainland, DO Inpatient       Prognosis:   Unable to determine.  Patient is much more alert today, conversant.  Discharge Planning:  To Be  Determined  Care plan was discussed with Dr. Wyline Copas  Thank you for allowing the Palliative Medicine Team to assist in the care of this patient.   Time In: 1200 Time Out: 1245 Total Time 45 min Prolonged Time Billed  no       Greater than 50%  of this time was spent counseling and coordinating care related to the above assessment and plan.  Dory Horn, NP  Please contact Palliative Medicine Team phone at [phone] for questions and concerns.

## 2017-09-30 NOTE — Progress Notes (Signed)
Subjective: [name] was much more interactive today. He was able to speak to me. He told me about the television show he was watching "Lucky Dog". He stated that he did not have any difficulty breathing or chest pain.   Objective: Vital signs in last 24 hours: Temp:  [97.4 F (36.3 C)-98.2 F (36.8 C)] 97.5 F (36.4 C) (09/14 0437) Pulse Rate:  [69-87] 86 (09/14 0437) Resp:  [16-20] 16 (09/14 0437) BP: (86-110)/(24-56) 106/33 (09/14 0437) SpO2:  [94 %-100 %] 94 % (09/14 0437) Weight:  [81.1 kg] 81.1 kg (09/13 2042) Weight change:   Intake/Output from previous day: 09/13 0701 - 09/14 0700 In: 1123 [P.O.:540; I.V.:100; Blood:483] Out: 0  Intake/Output this shift: No intake/output data recorded.  Physical Exam  Constitutional: She appears well-developed and well-nourished. No distress. Interactive with provider HENT:  Head: Normocephalic and atraumatic.  Eyes: Conjunctivae are normal.  Cardiovascular: Normal rate, regular rhythm and normal heart sounds.  Respiratory: Effort normal and breath sounds normal. No respiratory distress. She has no wheezes.  GI: Soft. Bowel sounds are normal. She exhibits no distension. There is no tenderness.  Musculoskeletal: She exhibits no edema.  Neurological: She is alert.  Skin: She is not diaphoretic. No erythema. Cool bilateral lower extremities Psychiatric: She has a normal mood and affect. Her behavior is normal. Judgment and thought content normal.    Lab Results: Recent Labs    09/28/17 0700 09/29/17 0358  WBC 7.8 6.5  HGB 11.6* 12.7*  HCT 39.9 42.9  PLT 483* 508*   BMET:  Recent Labs    09/28/17 0700 09/29/17 0358  NA 139 139  K 3.8 4.2  CL 100 99  CO2 23 23  GLUCOSE 85 78  BUN 38* 45*  CREATININE 7.23* 8.67*  CALCIUM 8.6* 8.5*   No results for input(s): PTH in the last 72 hours. Iron Studies: No results for input(s): IRON, TIBC, TRANSFERRIN, FERRITIN in the last 72 hours. Studies/Results: No results  found.  Assessment/Plan:  ESRD on hemodialysis MWF Palliative care spoke with patient's mother who expressed want to pursue dialysis if he can tolerate and if he cannot tolerate then she stated that she may want to pursue comfort care approach. Scheduled for a joint meeting with patient and patient's mother at noon today.   Patient was given Vitamin K and FFP yesterday to decrease INR. Today INR is 1.40. Plan for IR to place catheter if patient is stable from their perspective and subsequently assess for dialysis later in the day. Patient's labs from today 9/14 are pending. Please keep patient NPO for procedure.  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 Recommended 8 weeks IV Vancomycin. Will follow with Dr. Donato SchultzJay Ravishankar in [place].    LOS: 3 days   Perry [name] 09/30/2017,7:12 AM

## 2017-10-01 ENCOUNTER — Encounter (HOSPITAL_COMMUNITY): Payer: Self-pay | Admitting: *Deleted

## 2017-10-01 LAB — RENAL FUNCTION PANEL
ALBUMIN: 2.6 g/dL — AB (ref 3.5–5.0)
Anion gap: 17 — ABNORMAL HIGH (ref 5–15)
BUN: 28 mg/dL — AB (ref 6–20)
CALCIUM: 8.4 mg/dL — AB (ref 8.9–10.3)
CO2: 23 mmol/L (ref 22–32)
Chloride: 99 mmol/L (ref 98–111)
Creatinine, Ser: 7.46 mg/dL — ABNORMAL HIGH (ref 0.61–1.24)
GFR, EST AFRICAN AMERICAN: 9 mL/min — AB (ref >60)
GFR, EST NON AFRICAN AMERICAN: 8 mL/min — AB (ref >60)
Glucose, Bld: 140 mg/dL — ABNORMAL HIGH (ref 70–99)
PHOSPHORUS: 2.9 mg/dL (ref 2.5–4.6)
Potassium: 3.1 mmol/L — ABNORMAL LOW (ref 3.5–5.1)
Sodium: 139 mmol/L (ref 135–145)

## 2017-10-01 MED ORDER — CHLORHEXIDINE GLUCONATE CLOTH 2 % EX PADS: 6.0000 | MEDICATED_PAD | Freq: Every day | CUTANEOUS | Status: DC

## 2017-10-01 MED ORDER — WARFARIN SODIUM 2 MG PO TABS
2.0000 mg | ORAL_TABLET | Freq: Once | ORAL | Status: AC
  Administered 2017-10-01: 2 mg via ORAL
  Filled 2017-10-01: qty 1

## 2017-10-01 MED ORDER — WARFARIN - PHARMACIST DOSING INPATIENT
Freq: Every day | Status: DC
  Administered 2017-10-05 – 2017-10-06 (×2)

## 2017-10-01 NOTE — Progress Notes (Signed)
ANTICOAGULATION CONSULT NOTE - Initial Consult  Pharmacy Consult for warfarin Indication: Hx of CVA  Allergies  Allergen Reactions  . Depakote [Divalproex Sodium] Other (See Comments)    Hallucinations   . Hydromorphone Other (See Comments)    Patient Measurements: Height: 5\' 6"  (167.6 cm) Weight: 199 lb 11.8 oz (90.6 kg) IBW/kg (Calculated) : 63.8  Vital Signs: Temp: 97.5 F (36.4 C) (09/15 0900) Temp Source: Oral (09/15 0900) BP: 90/45 (09/15 0900) Pulse Rate: 91 (09/15 0900)  Labs: Recent Labs    09/29/17 0358 09/29/17 1355 09/30/17 0253 09/30/17 2130 10/01/17 1121  HGB 12.7*  --   --  11.3*  --   HCT 42.9  --   --  38.8*  --   PLT 508*  --   --  570*  --   LABPROT 24.9* 25.8* 17.0*  --   --   INR 2.27 2.39 1.40  --   --   CREATININE 8.67*  --   --  10.90* 7.46*    Estimated Creatinine Clearance: 13 mL/min (A) (by C-G formula based on SCr of 7.46 mg/dL (H)).   Medical History: Past Medical History:  Diagnosis Date  . Acute meniscal tear of left knee   . Acute meniscal tear of right knee   . Anxiety   . Chronic back pain    due to weight   . Dialysis patient (HCC)   . Fracture of transverse process of thoracic vertebra (HCC)    [facility] by neurosurgery  . GERD (gastroesophageal reflux disease)   . Hemodialysis patient [facility](HCC)    since 2011, does hemodialysis at home, wife does she is a [employer] , Daily except wed and sun  . High blood pressure    hx of   . Kidney failure    [facility]   . Seizures (HCC)   . Sleep apnea    cpap-     Medications:  Scheduled:  . aspirin  81 mg Oral Daily  . Chlorhexidine Gluconate Cloth  6 each Topical Q0600  . cinacalcet  60 mg Oral QHS  . escitalopram  20 mg Oral QHS  . midodrine  15 mg Oral TID  . multivitamin  1 tablet Oral Daily  . multivitamin  1 tablet Oral QHS  . pantoprazole  20 mg Oral Daily  . vancomycin variable dose per unstable renal  function (pharmacist dosing)   Does not apply See admin instructions  . vitamin A  10,000 Units Oral Daily  . ascorbic acid  250 mg Oral BID  . Vitamin D (Ergocalciferol)  50,000 Units Oral Q7 days    Assessment: 47 YOM ESRD on HD MWF admitted with sepsis secondary to MRSA mitral valve vegetation, which has likely grown into abscess. Now complicated by septal perforation.NOT surgical candidate per CVTS.  Patient on chronic warfarin for history of CVA. PTA dose is 3 mg daily. INR 4.23 on admit.  S/p L IJ TDC on 9/14. Consulted to start warfarin back.   Goal of Therapy:  INR 2-3 Monitor platelets by anticoagulation protocol: Yes   Plan:  Warfarin 2 mg x 1 today Daily INR  Nicholl Onstott A Fatimata Talsma 10/01/2017,3:14 PM

## 2017-10-01 NOTE — Progress Notes (Signed)
Pt refused CPAP stating he was just going to wear his 02.

## 2017-10-01 NOTE — Progress Notes (Signed)
Subjective: Interval History: Patient is having visual hallucinations this AM and is confused. He tells me there is a snake at the end of his bed and if he can get his shoes he will be on his way. He is able to tell me he has been on HD since 2011. He has not had HD since getting to the hospital.  Objective: Vital signs in last 24 hours: Temp:  [97.5 F (36.4 C)-98.1 F (36.7 C)] 97.5 F (36.4 C) (09/15 0340) Pulse Rate:  [78-89] 88 (09/15 0340) Resp:  [16-23] 18 (09/15 0340) BP: (88-107)/(29-72) 93/33 (09/15 0340) SpO2:  [89 %-100 %] 94 % (09/15 0340) Weight:  [90.6 kg] 90.6 kg (09/14 2105) Weight change: 9.5 kg  Intake/Output from previous day: 09/14 0701 - 09/15 0700 In: 240 [P.O.:240] Out: 0    Intake/Output this shift: No intake/output data recorded.  General: Well nourished male Pulm: Good air movement with no wheezing or crackles  CV: RRR, unable to appreciate any murmurs or rubs  Abdomen: Soft, non-distended, no tenderness to palpation  Extremities: No LE edema  Skin: Warm and dry  Neuro: Alert and disoriented  Lab Results: Recent Labs    09/29/17 0358 09/30/17 2130  WBC 6.5 8.2  HGB 12.7* 11.3*  HCT 42.9 38.8*  PLT 508* 570*   BMET:  Recent Labs    09/29/17 0358 09/30/17 2130  NA 139 139  K 4.2 3.4*  CL 99 96*  CO2 23 23  GLUCOSE 78 108*  BUN 45* 56*  CREATININE 8.67* 10.90*  CALCIUM 8.5* 8.3*   No results for input(s): PTH in the last 72 hours.   Iron Studies: No results for input(s): IRON, TIBC, TRANSFERRIN, FERRITIN in the last 72 hours.   Studies/Results: Ir Fluoro Guide Cv Line Left  Result Date: 09/30/2017 INDICATION: End-stage renal disease. In need of durable intravenous access for the continuation of dialysis. EXAM: TUNNELED CENTRAL VENOUS HEMODIALYSIS CATHETER PLACEMENT WITH ULTRASOUND AND FLUOROSCOPIC GUIDANCE MEDICATIONS: Ancef 2 gm IV . The antibiotic was given in an appropriate time interval prior to skin puncture.  ANESTHESIA/SEDATION: Versed 1 mg IV; Fentanyl 50 mcg IV; Moderate Sedation Time:  17 minutes The patient was continuously monitored during the procedure by the interventional radiology nurse under my direct supervision. FLUOROSCOPY TIME:  1 minute, 18 seconds (30 mGy) COMPLICATIONS: None immediate. PROCEDURE: Informed written consent was obtained from the patient after a discussion of the risks, benefits, and alternatives to treatment. Questions regarding the procedure were encouraged and answered. Sonographic evaluation was performed of the right neck and failed to delineate a patent right internal jugular vein. Sonographic evaluation was performed of the left neck and demonstrated a partially occluded left internal jugular vein. As such, decision was made to attempt placement of a left internal jugular approach dialysis catheter. The left neck and chest were prepped with chlorhexidine in a sterile fashion, and a sterile drape was applied covering the operative field. Maximum barrier sterile technique with sterile gowns and gloves were used for the procedure. A timeout was performed prior to the initiation of the procedure. After creating a small venotomy incision, a micropuncture kit was utilized to access the left internal jugular vein. Real-time ultrasound guidance was utilized for vascular access including the acquisition of a permanent ultrasound image documenting patency of the accessed vessel. The microwire was utilized to measure appropriate catheter length. A stiff Glidewire was advanced to the level of the IVC and the micropuncture sheath was exchanged for a peel-away sheath. A  palindrome tunneled hemodialysis catheter measuring 23 cm from tip to cuff was tunneled in a retrograde fashion from the anterior chest wall to the venotomy incision. The catheter was then placed through the peel-away sheath with tips ultimately positioned within the superior aspect of the right atrium. Final catheter positioning  was confirmed and documented with a spot radiographic image. The catheter aspirates and flushes normally. The catheter was flushed with appropriate volume heparin dwells. The catheter exit site was secured with a 0-Prolene retention suture. The venotomy incision was closed with Dermabond and Steri-strips. Dressings were applied. The patient tolerated the procedure well without immediate post procedural complication. IMPRESSION: Successful placement of 23 cm tip to cuff tunneled hemodialysis catheter via the left internal jugular vein with tips terminating within the superior aspect of the right atrium. The catheter is ready for immediate use. 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 Electronically Signed   By: Sandi Mariscal M.D.   On: 09/30/2017 18:01   Ir US Guide Vasc Access Left  Result Date: 09/30/2017 INDICATION: End-stage renal disease. In need of durable intravenous access for the continuation of dialysis. EXAM: TUNNELED CENTRAL VENOUS HEMODIALYSIS CATHETER PLACEMENT WITH ULTRASOUND AND FLUOROSCOPIC GUIDANCE MEDICATIONS: Ancef 2 gm IV . The antibiotic was given in an appropriate time interval prior to skin puncture. ANESTHESIA/SEDATION: Versed 1 mg IV; Fentanyl 50 mcg IV; Moderate Sedation Time:  17 minutes The patient was continuously monitored during the procedure by the interventional radiology nurse under my direct supervision. FLUOROSCOPY TIME:  1 minute, 18 seconds (30 mGy) COMPLICATIONS: None immediate. PROCEDURE: Informed written consent was obtained from the patient after a discussion of the risks, benefits, and alternatives to treatment. Questions regarding the procedure were encouraged and answered. Sonographic evaluation was performed of the right neck and failed to delineate a patent right internal jugular vein. Sonographic  evaluation was performed of the left neck and demonstrated a partially occluded left internal jugular vein. As such, decision was made to attempt placement of a left internal jugular approach dialysis catheter. The left neck and chest were prepped with chlorhexidine in a sterile fashion, and a sterile drape was applied covering the operative field. Maximum barrier sterile technique with sterile gowns and gloves were used for the procedure. A timeout was performed prior to the initiation of the procedure. After creating a small venotomy incision, a micropuncture kit was utilized to access the left internal jugular vein. Real-time ultrasound guidance was utilized for vascular access including the acquisition of a permanent ultrasound image documenting patency of the accessed vessel. The microwire was utilized to measure appropriate catheter length. A stiff Glidewire was advanced to the level of the IVC and the micropuncture sheath was exchanged for a peel-away sheath. A palindrome tunneled hemodialysis catheter measuring 23 cm from tip to cuff was tunneled in a retrograde fashion from the anterior chest wall to the venotomy incision. The catheter was then placed through the peel-away sheath with tips ultimately positioned within the superior aspect of the right atrium. Final catheter positioning was confirmed and documented with a spot radiographic image. The catheter aspirates and flushes normally. The catheter was flushed with appropriate volume heparin dwells. The catheter exit site was secured with a 0-Prolene retention suture. The venotomy incision was closed with Dermabond and Steri-strips. Dressings were applied. The patient tolerated the procedure well without immediate post procedural  complication. IMPRESSION: Successful placement of 23 cm tip to cuff tunneled hemodialysis catheter via the left internal jugular vein with tips terminating within the superior aspect of the right atrium. The catheter is ready  for immediate use. 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 Electronically Signed   By: Sandi Mariscal M.D.   On: 09/30/2017 18:01   Scheduled: . aspirin  81 mg Oral Daily  . Chlorhexidine Gluconate Cloth  6 each Topical Q0600  . cinacalcet  60 mg Oral QHS  . escitalopram  20 mg Oral QHS  . midodrine  15 mg Oral TID  . multivitamin  1 tablet Oral Daily  . multivitamin  1 tablet Oral QHS  . pantoprazole  20 mg Oral Daily  . vancomycin variable dose per unstable renal function (pharmacist dosing)   Does not apply See admin instructions  . vitamin A  10,000 Units Oral Daily  . ascorbic acid  250 mg Oral BID  . Vitamin D (Ergocalciferol)  50,000 Units Oral Q7 days   Assessment/Plan:  ESRD on HD MWF - Patient's last HD session was on 9/10 - New left subclavian tunneled HD cath placed on 9/14 - Repeat BC on 9/12 negative thus far  - Labs reviewed from 9/14. K 3.4, CO2 23, BUN 56, Ca 9.5 (corrected), PO4 3.8, Hgb 11.3  - Patient may be experiencing uremic symptoms and may need HD. Will discuss with Dr. 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 Carolin Sicks.    LOS: 4 days   [facility] 10/01/2017,9:17 AM

## 2017-10-01 NOTE — Progress Notes (Signed)
PROGRESS NOTE    Perry [name]  KZS:[id number] DOB: [birth year] DOA: 09/27/2017 PCP: [facility] [place]    Brief Narrative:  47 year old male with PMH notable for ESRD on HD via tunneled catheter. This was unfortunately complicated by bacteremia in April of this year. This was treated with catheter exchange and prolonged course of vancomycin. TEE was negative, but course was complicated by CVA during that admission, from which he now has residual L sided hemiparesis. He is reportedly bedbound and requires fully assisted care. He then presented to [facility] 9/11 with complaints of fevers and SOB. He was admitted and started on ABX to rule out PNA, however, blood cultures grew MRSA. He was narrowed to vancomycin. HD catheter was removed. He had TEE done 9/11 demonstrating large mitral vegetation with septal perforation tracking into the aortic valve, suspected abscess. 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 He was transferred to [facility] for CVTS evaluation.   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 NOT surgical candidate per CVTS  -  Patient is continued on IV vancomycin, anticipated at least 6 week total duration - ID had been following, recommendation for 8 weeks of vanc via HD and follow up with ID in [place] in 6-8 weeks. Decision to be made later if patient needs chronic suppression therapy afterwards if not operable candidate - CT surgery had seen in consultation, not surgical candidate at this time - Repeat blood cx neg noted - afebrile. No leukocytosis - Pt now s/p L IJ temp dialysis cath 9/14  Chronic hypotension - BP reviewed and is stable - Patient is continued on  midodrine  OSA on CPAP - Continue qhs CPAP as patient tolerates  ESRD on HD (MWF) - Nephrology following. Discussed with Dr. 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 Functional quadriplegia.  - Continue with supportive care - conversant. Appears stable -Had been on coumadin which was held, per below  Anticoagulation chronic warfarin: INR initially therapeutic, now subtherapeutic.  - INR was reversed for line placement -Had been on coumadin for history of CVA   DVT prophylaxis: SCD's Code Status: DNR Family Communication: Pt in room, family not at bedside Disposition Plan: Uncertain at this time  Consultants:   CCM  Nephrology  Palliative Care  ID  CTS  IR  Procedures:   TEE 9/11  Antimicrobials: Anti-infectives (From admission, onward)   Start     Dose/Rate Route Frequency Ordered Stop   09/30/17 1728  ceFAZolin (ANCEF) IVPB 1 g/50 mL premix     over 30 Minutes Intravenous Continuous PRN 09/30/17 1728 09/30/17 1715   09/30/17 1547  ceFAZolin (ANCEF) 2-4 GM/100ML-% IVPB    Note to Pharmacy:  Margaretmary Dys   : cabinet override      09/30/17 1547 09/30/17 1803   09/28/17 0913  vancomycin variable dose per unstable renal function (pharmacist dosing)      Does not apply See admin instructions 09/28/17 0913        Subjective: Without complaints at present  Objective: Vitals:   10/01/17 0000 10/01/17  0031 10/01/17 0340 10/01/17 0900  BP: (!) 88/36 (!) 90/40 (!) 93/33 (!) 90/45  Pulse: 80 84 88 91  Resp: 18 18 18 18   Temp:   (!) 97.5 F (36.4 C) (!) 97.5 F (36.4 C)  TempSrc:   Oral Oral  SpO2:   94% 96%  Weight:      Height:        Intake/Output Summary (Last 24 hours) at 10/01/2017 1425 Last data filed at 10/01/2017 1300 Gross per 24 hour  Intake 580 ml  Output -  Net 580 ml    Filed Weights   09/28/17 0600 09/29/17 2042 09/30/17 2105  Weight: 74.1 kg 81.1 kg 90.6 kg    Examination: General exam: Awake, laying in bed, in nad Respiratory system: Normal respiratory effort, no wheezing Cardiovascular system: regular rate, s1, s2 Gastrointestinal system: Soft, nondistended, positive BS Central nervous system: CN2-12 grossly intact, strength intact Extremities: Perfused, no clubbing Skin: Normal skin turgor, no notable skin lesions seen Psychiatry: Mood normal // no visual hallucinations   Data Reviewed: I have personally reviewed following labs and imaging studies  CBC: Recent Labs  Lab 09/26/17 1426 09/28/17 0700 09/29/17 0358 09/30/17 2130  WBC 10.6 7.8 6.5 8.2  HGB 11.5* 11.6* 12.7* 11.3*  HCT 36.9* 39.9 42.9 38.8*  MCV 85.3 88.1 86.7 89.4  PLT 397 483* 508* 121*   Basic Metabolic Panel: Recent Labs  Lab 09/26/17 1426 09/28/17 0700 09/29/17 0358 09/30/17 2130 10/01/17 1121  NA 143 139 139 139 139  K 3.8 3.8 4.2 3.4* 3.1*  CL 98 100 99 96* 99  CO2 27 23 23 23 23   GLUCOSE 101* 85 78 108* 140*  BUN 65* 38* 45* 56* 28*  CREATININE 9.26* 7.23* 8.67* 10.90* 7.46*  CALCIUM 8.8* 8.6* 8.5* 8.3* 8.4*  MG  --  2.3  --   --   --   PHOS 3.9 2.9 3.4 3.8 2.9   GFR: Estimated Creatinine Clearance: 13 mL/min (A) (by C-G formula based on SCr of 7.46 mg/dL (H)). Liver Function Tests: Recent Labs  Lab 09/26/17 1426 09/29/17 0358 09/30/17 2130 10/01/17 1121  ALBUMIN 2.4* 2.5* 2.6* 2.6*   No results for input(s): LIPASE, AMYLASE in the last 168 hours. No results for input(s): AMMONIA in the last 168 hours. Coagulation Profile: Recent Labs  Lab 09/27/17 0517 09/28/17 0700 09/29/17 0358 09/29/17 1355 09/30/17 0253  INR 1.54 1.86 2.27 2.39 1.40   Cardiac Enzymes: No results for input(s): CKTOTAL, CKMB, CKMBINDEX, TROPONINI in the last 168 hours. BNP (last 3 results) No results for input(s): PROBNP in the last 8760 hours. HbA1C: No  results for input(s): HGBA1C in the last 72 hours. CBG: No results for input(s): GLUCAP in the last 168 hours. Lipid Profile: No results for input(s): CHOL, HDL, LDLCALC, TRIG, CHOLHDL, LDLDIRECT in the last 72 hours. Thyroid Function Tests: No results for input(s): TSH, T4TOTAL, FREET4, T3FREE, THYROIDAB in the last 72 hours. Anemia Panel: No results for input(s): VITAMINB12, FOLATE, FERRITIN, TIBC, IRON, RETICCTPCT in the last 72 hours. Sepsis Labs: No results for input(s): PROCALCITON, LATICACIDVEN in the last 168 hours.  Recent Results (from the past 240 hour(s))  Blood Culture (routine x 2)     Status: Abnormal   Collection Time: 09/21/17  5:54 PM  Result Value Ref Range Status   Specimen Description   Final    BLOOD RIGHT HAND Performed at [facility], [address] [place], [place] [address]    Special Requests  Final    BOTTLES DRAWN AEROBIC AND ANAEROBIC Blood Culture results may not be optimal due to an excessive volume of blood received in culture bottles Performed at [facility], [address] [place], [place] [address]    Culture  Setup Time   Final    GRAM POSITIVE COCCI IN BOTH AEROBIC AND ANAEROBIC BOTTLES CRITICAL RESULT CALLED TO, READ BACK BY AND VERIFIED WITH: MATT MCBANE AT [place] ON 09/22/17 [place]. Performed at [facility], [place] [address] [place], [place] [address]    Culture METHICILLIN RESISTANT STAPHYLOCOCCUS AUREUS (A)  Final   Report Status 09/24/2017 FINAL  Final   Organism ID, Bacteria METHICILLIN RESISTANT STAPHYLOCOCCUS AUREUS  Final      Susceptibility   Methicillin resistant staphylococcus aureus - MIC*    CIPROFLOXACIN >=8 RESISTANT Resistant     ERYTHROMYCIN >=8 RESISTANT Resistant     GENTAMICIN <=0.5 SENSITIVE Sensitive     OXACILLIN >=4 RESISTANT Resistant     TETRACYCLINE <=1 SENSITIVE Sensitive     VANCOMYCIN <=0.5 SENSITIVE Sensitive     TRIMETH/SULFA <=10 SENSITIVE Sensitive     CLINDAMYCIN <=0.25  SENSITIVE Sensitive     RIFAMPIN <=0.5 SENSITIVE Sensitive     Inducible Clindamycin NEGATIVE Sensitive     * METHICILLIN RESISTANT STAPHYLOCOCCUS AUREUS  Blood Culture ID Panel (Reflexed)     Status: Abnormal   Collection Time: 09/21/17  5:54 PM  Result Value Ref Range Status   Enterococcus species NOT DETECTED NOT DETECTED Final   Listeria monocytogenes NOT DETECTED NOT DETECTED Final   Staphylococcus species DETECTED (A) NOT DETECTED Final    Comment: CRITICAL RESULT CALLED TO, READ BACK BY AND VERIFIED WITH: MATT MCBANE AT 0508 ON 09/22/17 [place].    Staphylococcus aureus DETECTED (A) NOT DETECTED Final    Comment: Methicillin (oxacillin)-resistant Staphylococcus aureus (MRSA). MRSA is predictably resistant to beta-lactam antibiotics (except ceftaroline). Preferred therapy is vancomycin unless clinically contraindicated. Patient requires contact precautions if  hospitalized. CRITICAL RESULT CALLED TO, READ BACK BY AND VERIFIED WITH: MATT MCBANE AT 0508 ON 09/22/17 [place].    Methicillin resistance DETECTED (A) NOT DETECTED Final    Comment: CRITICAL RESULT CALLED TO, READ BACK BY AND VERIFIED WITH: MATT MCBANE AT 0508 ON 09/22/17 [place].    Streptococcus species NOT DETECTED NOT DETECTED Final   Streptococcus agalactiae NOT DETECTED NOT DETECTED Final   Streptococcus pneumoniae NOT DETECTED NOT DETECTED Final   Streptococcus pyogenes NOT DETECTED NOT DETECTED Final   Acinetobacter baumannii NOT DETECTED NOT DETECTED Final   Enterobacteriaceae species NOT DETECTED NOT DETECTED Final   Enterobacter cloacae complex NOT DETECTED NOT DETECTED Final   Escherichia coli NOT DETECTED NOT DETECTED Final   Klebsiella oxytoca NOT DETECTED NOT DETECTED Final   Klebsiella pneumoniae NOT DETECTED NOT DETECTED Final   Proteus species NOT DETECTED NOT DETECTED Final   Serratia marcescens NOT DETECTED NOT DETECTED Final   Haemophilus influenzae NOT DETECTED NOT DETECTED Final   Neisseria meningitidis NOT  DETECTED NOT DETECTED Final   Pseudomonas aeruginosa NOT DETECTED NOT DETECTED Final   Candida albicans NOT DETECTED NOT DETECTED Final   Candida glabrata NOT DETECTED NOT DETECTED Final   Candida krusei NOT DETECTED NOT DETECTED Final   Candida parapsilosis NOT DETECTED NOT DETECTED Final   Candida tropicalis NOT DETECTED NOT DETECTED Final    Comment: Performed at [facility], [address] [place], [place] [address]  Blood Culture (routine x 2)     Status: Abnormal  Collection Time: 09/21/17  6:05 PM  Result Value Ref Range Status   Specimen Description   Final    BLOOD RIGHT [facility] Performed at [facility], [address], [place], [place] [address]    Special Requests   Final    BOTTLES DRAWN AEROBIC AND ANAEROBIC Blood Culture adequate volume Performed at [facility], [address] [place], [place] [address]    Culture  Setup Time   Final    GRAM POSITIVE COCCI IN BOTH AEROBIC AND ANAEROBIC BOTTLES CRITICAL RESULT CALLED TO, READ BACK BY AND VERIFIED WITH: MATT MCBANE AT 0508 ON 09/22/17 [place]. Performed at [facility], [address] [place], [place] [address]    Culture (A)  Final    STAPHYLOCOCCUS AUREUS SUSCEPTIBILITIES PERFORMED ON PREVIOUS CULTURE WITHIN THE LAST 5 DAYS. Performed at [facility], [place] [address], [place], [place] [address]    Report Status 09/24/2017 FINAL  Final  MRSA PCR Screening     Status: Abnormal   Collection Time: 09/22/17 12:16 AM  Result Value Ref Range Status   MRSA by PCR POSITIVE (A) NEGATIVE Final    Comment:        The GeneXpert MRSA Assay (FDA approved for NASAL specimens only), is one component of a comprehensive MRSA colonization surveillance program. It is not intended to diagnose MRSA infection nor to guide or monitor treatment for MRSA infections. CRITICAL RESULT CALLED TO, READ BACK BY AND VERIFIED WITH: C/MARCELLA TURNER @0212  09/22/17 [place] Performed at [facility], [address] [place], [place] [address]   CULTURE, BLOOD (ROUTINE X 2) w Reflex to ID Panel     Status: None   Collection Time: 09/22/17  9:03 AM  Result Value Ref Range Status   Specimen Description BLOOD RIGHT ANTECUBITAL  Final   Special Requests   Final    BOTTLES DRAWN AEROBIC AND ANAEROBIC Blood Culture adequate volume   Culture   Final    NO GROWTH 5 DAYS Performed at [facility], [address] [place], [place] [address]    Report Status 09/27/2017 FINAL  Final  CULTURE, BLOOD (ROUTINE X 2) w Reflex to ID Panel     Status: None   Collection Time: 09/22/17  9:03 AM  Result Value Ref Range Status   Specimen Description BLOOD BLOOD LEFT HAND  Final   Special Requests   Final    BOTTLES DRAWN AEROBIC AND ANAEROBIC Blood Culture adequate volume   Culture   Final    NO GROWTH 5 DAYS Performed at [facility], [address], [place], [place] [address]    Report Status 09/27/2017 FINAL  Final  Cath Tip Culture     Status: Abnormal   Collection Time: 09/27/17  9:07 AM  Result Value Ref Range Status   Specimen Description CATH TIP  Final   Special Requests   Final    NONE Performed at [facility], [place] [address] [place], [place] [address]    Culture (A)  Final    800 COLONIES/mL METHICILLIN RESISTANT STAPHYLOCOCCUS AUREUS   Report Status 09/29/2017 FINAL  Final   Organism ID, Bacteria METHICILLIN RESISTANT STAPHYLOCOCCUS AUREUS (A)  Final      Susceptibility   Methicillin resistant staphylococcus aureus - MIC*    CIPROFLOXACIN >=8 RESISTANT Resistant     ERYTHROMYCIN >=8 RESISTANT Resistant     GENTAMICIN <=0.5 SENSITIVE Sensitive     OXACILLIN >=4 RESISTANT Resistant  TETRACYCLINE <=1 SENSITIVE Sensitive     VANCOMYCIN <=0.5 SENSITIVE Sensitive     TRIMETH/SULFA <=10 SENSITIVE Sensitive     CLINDAMYCIN <=0.25 SENSITIVE Sensitive     RIFAMPIN <=0.5 SENSITIVE Sensitive     Inducible Clindamycin NEGATIVE Sensitive     * 800  COLONIES/mL METHICILLIN RESISTANT STAPHYLOCOCCUS AUREUS  Culture, blood (Routine X 2) w Reflex to ID Panel     Status: None (Preliminary result)   Collection Time: 09/28/17  7:00 AM  Result Value Ref Range Status   Specimen Description BLOOD RIGHT ARM  Final   Special Requests   Final    BOTTLES DRAWN AEROBIC ONLY Blood Culture results may not be optimal due to an inadequate volume of blood received in culture bottles   Culture   Final    NO GROWTH 2 DAYS Performed at [facility], [place] [address] [place], [place] [address]    Report Status PENDING  Incomplete  Culture, blood (Routine X 2) w Reflex to ID Panel     Status: None (Preliminary result)   Collection Time: 09/28/17  7:10 AM  Result Value Ref Range Status   Specimen Description BLOOD RIGHT HAND  Final   Special Requests   Final    BOTTLES DRAWN AEROBIC ONLY Blood Culture results may not be optimal due to an inadequate volume of blood received in culture bottles   Culture   Final    NO GROWTH 2 DAYS Performed at [facility], [place] [address], [place], [place] [address]    Report Status PENDING  Incomplete     Radiology Studies: Ir Fluoro Guide Cv Line Left  Result Date: 09/30/2017 INDICATION: End-stage renal disease. In need of durable intravenous access for the continuation of dialysis. EXAM: TUNNELED CENTRAL VENOUS HEMODIALYSIS CATHETER PLACEMENT WITH ULTRASOUND AND FLUOROSCOPIC GUIDANCE MEDICATIONS: Ancef 2 gm IV . The antibiotic was given in an appropriate time interval prior to skin puncture. ANESTHESIA/SEDATION: Versed 1 mg IV; Fentanyl 50 mcg IV; Moderate Sedation Time:  17 minutes The patient was continuously monitored during the procedure by the interventional radiology nurse under my direct supervision. FLUOROSCOPY TIME:  1 minute, 18 seconds (30 mGy) COMPLICATIONS: None immediate. PROCEDURE: Informed written consent was obtained from the patient after a discussion of the risks, benefits, and alternatives to  treatment. Questions regarding the procedure were encouraged and answered. Sonographic evaluation was performed of the right neck and failed to delineate a patent right internal jugular vein. Sonographic evaluation was performed of the left neck and demonstrated a partially occluded left internal jugular vein. As such, decision was made to attempt placement of a left internal jugular approach dialysis catheter. The left neck and chest were prepped with chlorhexidine in a sterile fashion, and a sterile drape was applied covering the operative field. Maximum barrier sterile technique with sterile gowns and gloves were used for the procedure. A timeout was performed prior to the initiation of the procedure. After creating a small venotomy incision, a micropuncture kit was utilized to access the left internal jugular vein. Real-time ultrasound guidance was utilized for vascular access including the acquisition of a permanent ultrasound image documenting patency of the accessed vessel. The microwire was utilized to measure appropriate catheter length. A stiff Glidewire was advanced to the level of the IVC and the micropuncture sheath was exchanged for a peel-away sheath. A palindrome tunneled hemodialysis catheter measuring 23 cm from tip to cuff was tunneled in a retrograde fashion from the anterior chest wall to  the venotomy incision. The catheter was then placed through the peel-away sheath with tips ultimately positioned within the superior aspect of the right atrium. Final catheter positioning was confirmed and documented with a spot radiographic image. The catheter aspirates and flushes normally. The catheter was flushed with appropriate volume heparin dwells. The catheter exit site was secured with a 0-Prolene retention suture. The venotomy incision was closed with Dermabond and Steri-strips. Dressings were applied. The patient tolerated the procedure well without immediate post procedural complication.  IMPRESSION: Successful placement of 23 cm tip to cuff tunneled hemodialysis catheter via the left internal jugular vein with tips terminating within the superior aspect of the right atrium. The catheter is ready for immediate use. 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 Electronically Signed   By: Sandi Mariscal M.D.   On: 09/30/2017 18:01   Ir US Guide Vasc Access Left  Result Date: 09/30/2017 INDICATION: End-stage renal disease. In need of durable intravenous access for the continuation of dialysis. EXAM: TUNNELED CENTRAL VENOUS HEMODIALYSIS CATHETER PLACEMENT WITH ULTRASOUND AND FLUOROSCOPIC GUIDANCE MEDICATIONS: Ancef 2 gm IV . The antibiotic was given in an appropriate time interval prior to skin puncture. ANESTHESIA/SEDATION: Versed 1 mg IV; Fentanyl 50 mcg IV; Moderate Sedation Time:  17 minutes The patient was continuously monitored during the procedure by the interventional radiology nurse under my direct supervision. FLUOROSCOPY TIME:  1 minute, 18 seconds (30 mGy) COMPLICATIONS: None immediate. PROCEDURE: Informed written consent was obtained from the patient after a discussion of the risks, benefits, and alternatives to treatment. Questions regarding the procedure were encouraged and answered. Sonographic evaluation was performed of the right neck and failed to delineate a patent right internal jugular vein. Sonographic evaluation was performed of the left neck and demonstrated a partially occluded left internal jugular vein. As such, decision was made to attempt placement of a left internal jugular approach dialysis catheter. The left neck and chest were prepped with chlorhexidine in a sterile fashion, and a sterile drape was applied covering the operative field. Maximum barrier sterile technique with sterile gowns and gloves were  used for the procedure. A timeout was performed prior to the initiation of the procedure. After creating a small venotomy incision, a micropuncture kit was utilized to access the left internal jugular vein. Real-time ultrasound guidance was utilized for vascular access including the acquisition of a permanent ultrasound image documenting patency of the accessed vessel. The microwire was utilized to measure appropriate catheter length. A stiff Glidewire was advanced to the level of the IVC and the micropuncture sheath was exchanged for a peel-away sheath. A palindrome tunneled hemodialysis catheter measuring 23 cm from tip to cuff was tunneled in a retrograde fashion from the anterior chest wall to the venotomy incision. The catheter was then placed through the peel-away sheath with tips ultimately positioned within the superior aspect of the right atrium. Final catheter positioning was confirmed and documented with a spot radiographic image. The catheter aspirates and flushes normally. The catheter was flushed with appropriate volume heparin dwells. The catheter exit site was secured with a 0-Prolene retention suture. The venotomy incision was closed with Dermabond and Steri-strips. Dressings were applied. The patient tolerated the procedure well without immediate post procedural complication. IMPRESSION: Successful placement of 23 cm tip to cuff tunneled hemodialysis catheter via the left internal jugular vein with tips terminating within  the superior aspect of the right atrium. The catheter is ready for immediate use. 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 Electronically Signed   By: Sandi Mariscal M.D.   On: 09/30/2017 18:01    Scheduled Meds: . aspirin  81 mg Oral Daily  . Chlorhexidine Gluconate Cloth  6 each Topical Q0600  . cinacalcet   60 mg Oral QHS  . escitalopram  20 mg Oral QHS  . midodrine  15 mg Oral TID  . multivitamin  1 tablet Oral Daily  . multivitamin  1 tablet Oral QHS  . pantoprazole  20 mg Oral Daily  . vancomycin variable dose per unstable renal function (pharmacist dosing)   Does not apply See admin instructions  . vitamin A  10,000 Units Oral Daily  . ascorbic acid  250 mg Oral BID  . Vitamin D (Ergocalciferol)  50,000 Units Oral Q7 days   Continuous Infusions: . sodium chloride    . sodium chloride       LOS: 4 days   Marylu Lund, MD [facility] Pager On Amion  If 7PM-7AM, please contact night-coverage 10/01/2017, 2:25 PM

## 2017-10-02 LAB — CBC
HEMATOCRIT: 41.9 % (ref 39.0–52.0)
Hemoglobin: 12.1 g/dL — ABNORMAL LOW (ref 13.0–17.0)
MCH: 26 pg (ref 26.0–34.0)
MCHC: 28.9 g/dL — AB (ref 30.0–36.0)
MCV: 90.1 fL (ref 78.0–100.0)
PLATELETS: 535 10*3/uL — AB (ref 150–400)
RBC: 4.65 MIL/uL (ref 4.22–5.81)
RDW: 24.1 % — AB (ref 11.5–15.5)
WBC: 8 10*3/uL (ref 4.0–10.5)

## 2017-10-02 LAB — RENAL FUNCTION PANEL
Albumin: 2.6 g/dL — ABNORMAL LOW (ref 3.5–5.0)
Anion gap: 14 (ref 5–15)
BUN: 35 mg/dL — AB (ref 6–20)
CALCIUM: 8.3 mg/dL — AB (ref 8.9–10.3)
CO2: 26 mmol/L (ref 22–32)
CREATININE: 8.57 mg/dL — AB (ref 0.61–1.24)
Chloride: 100 mmol/L (ref 98–111)
GFR calc Af Amer: 8 mL/min — ABNORMAL LOW (ref >60)
GFR, EST NON AFRICAN AMERICAN: 7 mL/min — AB (ref >60)
GLUCOSE: 95 mg/dL (ref 70–99)
PHOSPHORUS: 3.9 mg/dL (ref 2.5–4.6)
Potassium: 3.3 mmol/L — ABNORMAL LOW (ref 3.5–5.1)
SODIUM: 140 mmol/L (ref 135–145)

## 2017-10-02 LAB — VANCOMYCIN, RANDOM: VANCOMYCIN RM: 21

## 2017-10-02 LAB — PROTIME-INR
INR: 1.13
PROTHROMBIN TIME: 14.4 s (ref 11.4–15.2)

## 2017-10-02 LAB — HEPARIN LEVEL (UNFRACTIONATED): HEPARIN UNFRACTIONATED: 0.14 [IU]/mL — AB (ref 0.30–0.70)

## 2017-10-02 MED ORDER — ALBUMIN HUMAN 25 % IV SOLN
12.5000 g | Freq: Once | INTRAVENOUS | Status: AC
  Administered 2017-10-02: 12.5 g via INTRAVENOUS

## 2017-10-02 MED ORDER — HEPARIN SODIUM (PORCINE) 1000 UNIT/ML DIALYSIS: 20.0000 [IU]/kg | INTRAMUSCULAR | Status: DC | PRN

## 2017-10-02 MED ORDER — LIDOCAINE-PRILOCAINE 2.5-2.5 % EX CREA: 1.0000 "application " | TOPICAL_CREAM | CUTANEOUS | Status: DC | PRN

## 2017-10-02 MED ORDER — HEPARIN (PORCINE) IN NACL 100-0.45 UNIT/ML-% IJ SOLN
1650.0000 [IU]/h | INTRAMUSCULAR | Status: DC
  Administered 2017-10-02: 1000 [IU]/h via INTRAVENOUS
  Administered 2017-10-03: 1250 [IU]/h via INTRAVENOUS
  Administered 2017-10-04 (×2): 1800 [IU]/h via INTRAVENOUS
  Filled 2017-10-02 (×4): qty 250

## 2017-10-02 MED ORDER — PENTAFLUOROPROP-TETRAFLUOROETH EX AERO: 1.0000 "application " | INHALATION_SPRAY | CUTANEOUS | Status: DC | PRN

## 2017-10-02 MED ORDER — ALTEPLASE 2 MG IJ SOLR: 2.0000 mg | Freq: Once | INTRAMUSCULAR | Status: DC | PRN

## 2017-10-02 MED ORDER — SODIUM CHLORIDE 0.9 % IV SOLN: 100.0000 mL | INTRAVENOUS | Status: DC | PRN

## 2017-10-02 MED ORDER — ALBUMIN HUMAN 25 % IV SOLN
INTRAVENOUS | Status: AC
  Administered 2017-10-02: 12.5 g via INTRAVENOUS
  Filled 2017-10-02: qty 50

## 2017-10-02 MED ORDER — MIDODRINE HCL 5 MG PO TABS
ORAL_TABLET | ORAL | Status: AC
  Administered 2017-10-02: 15 mg via ORAL
  Filled 2017-10-02: qty 3

## 2017-10-02 MED ORDER — ALBUMIN HUMAN 25 % IV SOLN
INTRAVENOUS | Status: AC
  Filled 2017-10-02: qty 50

## 2017-10-02 MED ORDER — LIDOCAINE HCL (PF) 1 % IJ SOLN: 5.0000 mL | INTRAMUSCULAR | Status: DC | PRN

## 2017-10-02 MED ORDER — HEPARIN SODIUM (PORCINE) 1000 UNIT/ML DIALYSIS: 1000.0000 [IU] | INTRAMUSCULAR | Status: DC | PRN

## 2017-10-02 MED ORDER — ALBUMIN HUMAN 25 % IV SOLN
12.5000 g | Freq: Once | INTRAVENOUS | Status: AC
  Administered 2017-10-02: 12.5 g via INTRAVENOUS
  Filled 2017-10-02: qty 50

## 2017-10-02 NOTE — Progress Notes (Signed)
Patient refusing CPAP.  Stated he would rather have oxygen instead of using CPAP.

## 2017-10-02 NOTE — Procedures (Signed)
I was present at this dialysis session. I have reviewed the session itself and made appropriate changes.   Filed Weights   09/29/17 2042 09/30/17 2105 10/02/17 0427  Weight: 81.1 kg 90.6 kg 90.6 kg    Recent Labs  Lab 10/01/17 1121  NA 139  K 3.1*  CL 99  CO2 23  GLUCOSE 140*  BUN 28*  CREATININE 7.46*  CALCIUM 8.4*  PHOS 2.9    Recent Labs  Lab 09/28/17 0700 09/29/17 0358 09/30/17 2130  WBC 7.8 6.5 8.2  HGB 11.6* 12.7* 11.3*  HCT 39.9 42.9 38.8*  MCV 88.1 86.7 89.4  PLT 483* 508* 570*    Scheduled Meds: . aspirin  81 mg Oral Daily  . Chlorhexidine Gluconate Cloth  6 each Topical Q0600  . cinacalcet  60 mg Oral QHS  . escitalopram  20 mg Oral QHS  . midodrine  15 mg Oral TID  . multivitamin  1 tablet Oral Daily  . multivitamin  1 tablet Oral QHS  . pantoprazole  20 mg Oral Daily  . vancomycin variable dose per unstable renal function (pharmacist dosing)   Does not apply See admin instructions  . vitamin A  10,000 Units Oral Daily  . ascorbic acid  250 mg Oral BID  . Vitamin D (Ergocalciferol)  50,000 Units Oral Q7 days  . Warfarin - Pharmacist Dosing Inpatient   Does not apply q1800   Continuous Infusions: . sodium chloride    . sodium chloride    . sodium chloride    . sodium chloride    . albumin human     PRN Meds:.sodium chloride, sodium chloride, sodium chloride, sodium chloride, alteplase, alteplase, heparin, heparin, heparin, heparin, lidocaine (PF), lidocaine-prilocaine, pentafluoroprop-tetrafluoroeth, RESOURCE THICKENUP CLEAR    Assessment/Plan: 1. 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  TDC removed 09/27/17 new HD LIJ tdc placed 09/30/17. 2. 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  8 week ao fabx and palliative care consulted. 3. ESRD- cont with HD qMWF for now 4. Hypotension- chronic and asymptomatic for now.  Continue with midodrine 5. Vascular access- poorly functioning HD cath  and may need to be changed/repositioned. 6. Disposition- poor prognosis 7. H/o CVA and left hemiparesis. Perry CordsJoseph A Arshawn Valdez,  MD 10/02/2017, 8:17 AM

## 2017-10-02 NOTE — Progress Notes (Signed)
ANTICOAGULATION CONSULT NOTE  Pharmacy Consult for Heparin + Warfarin (currently held) Indication: Hx of CVA and DVT  Patient Measurements: Height: 5\' 6"  (167.6 cm) Weight: 199 lb 11.8 oz (90.6 kg) IBW/kg (Calculated) : 63.8  Heparin Dosing Wt: 74 kg  Vital Signs: Temp: 97.8 F (36.6 C) (09/16 0655) Temp Source: Oral (09/16 0655) BP: 125/58 (09/16 0655) Pulse Rate: 67 (09/16 0655)  Labs: Recent Labs    09/29/17 1355 09/30/17 0253 09/30/17 2130 10/01/17 1121 10/02/17 0434 10/02/17 0806  HGB  --   --  11.3*  --   --  12.1*  HCT  --   --  38.8*  --   --  41.9  PLT  --   --  570*  --   --  535*  LABPROT 25.8* 17.0*  --   --  14.4  --   INR 2.39 1.40  --   --  1.13  --   CREATININE  --   --  10.90* 7.46*  --  8.57*    Estimated Creatinine Clearance: 11.3 mL/min (A) (by C-G formula based on SCr of 8.57 mg/dL (H)).   Medications:  Scheduled:  . aspirin  81 mg Oral Daily  . Chlorhexidine Gluconate Cloth  6 each Topical Q0600  . cinacalcet  60 mg Oral QHS  . escitalopram  20 mg Oral QHS  . midodrine  15 mg Oral TID  . multivitamin  1 tablet Oral Daily  . multivitamin  1 tablet Oral QHS  . pantoprazole  20 mg Oral Daily  . vancomycin variable dose per unstable renal function (pharmacist dosing)   Does not apply See admin instructions  . vitamin A  10,000 Units Oral Daily  . ascorbic acid  250 mg Oral BID  . Vitamin D (Ergocalciferol)  50,000 Units Oral Q7 days  . 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 Now complicated by septal perforation.NOT surgical candidate per CVTS.  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 PTA dose is 3 mg daily. INR 4.23 on admit.  Warfarin was resumed on 9/15 evening [facility] however not functioning properly and may need repositioning/replacement  per renal. Will hold warfarin per discussion with renal/TRH MD and will bridge with Heparin for now.   Goal of Therapy:  INR 2-3 Monitor platelets by anticoagulation protocol: Yes   Plan:  - Hold Warfarin - Will follow-up with renal plans [facility] adjusting[facility] - Start Heparin at 1000 units/hr (10 ml/hr) - Will continue to monitor for any signs/symptoms of bleeding and will follow up with heparin level in 8 hours   Thank you for allowing pharmacy to be a part of this patient's care.  Georgina PillionElizabeth Abdulhadi Stopa, PharmD, BCPS Clinical Pharmacist Pager: [phone] Clinical phone for 10/02/2017 from 7a-3:30p: [phone] If after 3:30p, please call main pharmacy at: [phone] Please check AMION for all [facility] 10/02/2017 9:44 AM

## 2017-10-02 NOTE — Progress Notes (Signed)
Patient's SBP was consistently now low through out treatment, he got 2 doses of albumin but BP was still low, reported to Dr Arrie Aranoladonato and he ordered that treatment be discontinued, report was called to the primary nurse and  Dr Rhona Leavenshiu was informed as well.

## 2017-10-02 NOTE — Progress Notes (Signed)
ANTICOAGULATION CONSULT NOTE - Follow Up  Pharmacy Consult for Heparin  Indication: Hx of CVA and DVT (Warfarin on hold)  Patient Measurements: Height: 5\' 6"  (167.6 cm) Weight: 199 lb 11.8 oz (90.6 kg) IBW/kg (Calculated) : 63.8  Heparin Dosing Wt: 74 kg  Vital Signs: Temp: 98.1 F (36.7 C) (09/16 1117) Temp Source: Oral (09/16 1117) BP: 73/50 (09/16 1117) Pulse Rate: 87 (09/16 1117)  Labs: Recent Labs    09/30/17 0253 09/30/17 2130 10/01/17 1121 10/02/17 0434 10/02/17 0806 10/02/17 1931  HGB  --  11.3*  --   --  12.1*  --   HCT  --  38.8*  --   --  41.9  --   PLT  --  570*  --   --  535*  --   LABPROT 17.0*  --   --  14.4  --   --   INR 1.40  --   --  1.13  --   --   HEPARINUNFRC  --   --   --   --   --  0.14*  CREATININE  --  10.90* 7.46*  --  8.57*  --     Estimated Creatinine Clearance: 11.3 mL/min (A) (by C-G formula based on SCr of 8.57 mg/dL (H)).   Medications:  Scheduled:  . aspirin  81 mg Oral Daily  . Chlorhexidine Gluconate Cloth  6 each Topical Q0600  . cinacalcet  60 mg Oral QHS  . escitalopram  20 mg Oral QHS  . midodrine  15 mg Oral TID  . multivitamin  1 tablet Oral Daily  . multivitamin  1 tablet Oral QHS  . pantoprazole  20 mg Oral Daily  . vancomycin variable dose per unstable renal function (pharmacist dosing)   Does not apply See admin instructions  . vitamin A  10,000 Units Oral Daily  . ascorbic acid  250 mg Oral BID  . Vitamin D (Ergocalciferol)  50,000 Units Oral Q7 days  . 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 Now complicated by septal perforation.NOT surgical candidate per CVTS.  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 PTA dose is 3 mg daily. INR 4.23 on admit.  Warfarin was resumed on 9/15 evening [facility] however not functioning  properly and may need repositioning/replacement per renal. Will hold warfarin per discussion with renal/TRH MD and will bridge with Heparin for now.   Heparin level is subtherapeutic on 1000 units/hr.  No IV issues noted.  Goal of Therapy:  INR 2-3  Heparin level 0.3-0.7 Monitor platelets by anticoagulation protocol: Yes   Plan:  - Increase Heparin to 1250 units/hr - Will continue to monitor for any signs/symptoms of bleeding and will follow up with heparin level in 8 hours   Thank you for allowing pharmacy to be a part of this patient's care.  Toys 'R' UsKimberly Graham Doukas, Pharm.D., BCPS Clinical Pharmacist  **Pharmacist phone directory can now be found on [email or web address] ([email or web address]).  Listed under [facility].  10/02/2017 10:08 PM

## 2017-10-02 NOTE — Progress Notes (Signed)
PROGRESS NOTE    Perry [name]  GSU:[id number] DOB: 1970/05/10 DOA: 09/27/2017 PCP: [facility]    Brief Narrative:  47 year old male with PMH notable for ESRD on HD via tunneled catheter. This was unfortunately complicated by bacteremia in April of this year. This was treated with catheter exchange and prolonged course of vancomycin. TEE was negative, but course was complicated by CVA during that admission, from which he now has residual L sided hemiparesis. He is reportedly bedbound and requires fully assisted care. He then presented to [facility] 9/11 with complaints of fevers and SOB. He was admitted and started on ABX to rule out PNA, however, blood cultures grew MRSA. He was narrowed to vancomycin. HD catheter was removed. He had TEE done 9/11 demonstrating large mitral vegetation with septal perforation tracking into the aortic valve, suspected abscess. 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 He was transferred to [facility] for CVTS evaluation.   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 NOT surgical candidate per CVTS  -  Patient is continued on IV vancomycin, anticipated at least 6 week total duration - ID had been following, recommendation for 8 weeks of vanc via HD and follow up with ID in [place] in 6-8 weeks. Decision to be made later if patient needs chronic suppression therapy afterwards if not operable candidate - CT surgery had seen in consultation, not surgical candidate at this time - Repeat blood cx neg noted - afebrile. No leukocytosis - Pt is s/p L IJ temp dialysis cath 9/14, now concerns for poorly functioning HD cath which may ned to be changed or  repositioned  Chronic hypotension - BP reviewed and is stable - Will continue on midodrine as tolerated  OSA on CPAP - Will continue on CPAP as patient tolerates  ESRD on HD (MWF) - Nephrology following. - Concerns for possible malfunctioning HD cath. IR consult  History of CVA April 2019: residual L sided hemiparesis. Functional quadriplegia.  - Continue with supportive care - conversant. Appears stable - Held coumadin per concerns of possible HD cath needing repositioning per Nephrology -Continue on heparin gtt for now  Anticoagulation chronic warfarin: INR initially therapeutic, now subtherapeutic.  - INR was reversed for line placement -Had been on coumadin for history of CVA, currently on heparin gtt per above   DVT prophylaxis: heparin gtt Code Status: DNR Family Communication: Pt in room, family not at bedside Disposition Plan: Uncertain at this time  Consultants:   CCM  Nephrology  Palliative Care  ID  CTS  IR  Procedures:   TEE 9/11  Antimicrobials: Anti-infectives (From admission, onward)   Start     Dose/Rate Route Frequency Ordered Stop   09/30/17 1728  ceFAZolin (ANCEF) IVPB 1 g/50 mL premix     over 30 Minutes Intravenous Continuous PRN 09/30/17 1728 09/30/17 1715   09/30/17 1547  ceFAZolin (ANCEF) 2-4 GM/100ML-% IVPB    Note to Pharmacy:  Margaretmary Dys   : cabinet override      09/30/17 1547 09/30/17 1803   09/28/17 0913  vancomycin variable dose per unstable renal function (pharmacist dosing)      Does not apply See admin instructions 09/28/17 0913  Subjective: Asleep, on dialysis  Objective: Vitals:   10/02/17 1000 10/02/17 1028 10/02/17 1040 10/02/17 1117  BP: (!) 57/27 (!) 77/40 (!) 103/36 (!) 73/50  Pulse: 77 82 78 87  Resp:  (!) 21  20  Temp:  97.6 F (36.4 C)  98.1 F (36.7 C)  TempSrc:  Oral  Oral  SpO2:    96%  Weight:      Height:        Intake/Output Summary (Last 24 hours) at 10/02/2017 1456 Last data  filed at 10/02/2017 1028 Gross per 24 hour  Intake 240 ml  Output -300 ml  Net 540 ml   Filed Weights   09/29/17 2042 09/30/17 2105 10/02/17 0427  Weight: 81.1 kg 90.6 kg 90.6 kg    Examination: General exam: Asleep, in no acute distress Respiratory system: normal chest rise, clear, no audible wheezing Cardiovascular system: regular rhythm, s1-s2 Gastrointestinal system: Nondistended, nontender, pos BS Central nervous system: No seizures, no tremors Extremities: No cyanosis, no joint deformities Skin: No rashes, no pallor Psychiatry: Affect normal // no auditory hallucinations   Data Reviewed: I have personally reviewed following labs and imaging studies  CBC: Recent Labs  Lab 09/26/17 1426 09/28/17 0700 09/29/17 0358 09/30/17 2130 10/02/17 0806  WBC 10.6 7.8 6.5 8.2 8.0  HGB 11.5* 11.6* 12.7* 11.3* 12.1*  HCT 36.9* 39.9 42.9 38.8* 41.9  MCV 85.3 88.1 86.7 89.4 90.1  PLT 397 483* 508* 570* 482*   Basic Metabolic Panel: Recent Labs  Lab 09/28/17 0700 09/29/17 0358 09/30/17 2130 10/01/17 1121 10/02/17 0806  NA 139 139 139 139 140  K 3.8 4.2 3.4* 3.1* 3.3*  CL 100 99 96* 99 100  CO2 _0 GLUCOSE 85 78 108* 140* 95  BUN 38* 45* 56* 28* 35*  CREATININE 7.23* 8.67* 10.90* 7.46* 8.57*  CALCIUM 8.6* 8.5* 8.3* 8.4* 8.3*  MG 2.3  --   --   --   --   PHOS 2.9 3.4 3.8 2.9 3.9   GFR: Estimated Creatinine Clearance: 11.3 mL/min (A) (by C-G formula based on SCr of 8.57 mg/dL (H)). Liver Function Tests: Recent Labs  Lab 09/26/17 1426 09/29/17 0358 09/30/17 2130 10/01/17 1121 10/02/17 0806  ALBUMIN 2.4* 2.5* 2.6* 2.6* 2.6*   No results for input(s): LIPASE, AMYLASE in the last 168 hours. No results for input(s): AMMONIA in the last 168 hours. Coagulation Profile: Recent Labs  Lab 09/28/17 0700 09/29/17 0358 09/29/17 1355 09/30/17 0253 10/02/17 0434  INR 1.86 2.27 2.39 1.40 1.13   Cardiac Enzymes: No results for input(s): CKTOTAL, CKMB,  CKMBINDEX, TROPONINI in the last 168 hours. BNP (last 3 results) No results for input(s): PROBNP in the last 8760 hours. HbA1C: No results for input(s): HGBA1C in the last 72 hours. CBG: No results for input(s): GLUCAP in the last 168 hours. Lipid Profile: No results for input(s): CHOL, HDL, LDLCALC, TRIG, CHOLHDL, LDLDIRECT in the last 72 hours. Thyroid Function Tests: No results for input(s): TSH, T4TOTAL, FREET4, T3FREE, THYROIDAB in the last 72 hours. Anemia Panel: No results for input(s): VITAMINB12, FOLATE, FERRITIN, TIBC, IRON, RETICCTPCT in the last 72 hours. Sepsis Labs: No results for input(s): PROCALCITON, LATICACIDVEN in the last 168 hours.  Recent Results (from the past 240 hour(s))  Cath Tip Culture     Status: Abnormal   Collection Time: 09/27/17  9:07 AM  Result Value Ref Range Status   Specimen Description CATH TIP  Final   Special Requests  Final    NONE Performed at [facility], [place] [address] [place], [place] [address]    Culture (A)  Final    800 COLONIES/mL METHICILLIN RESISTANT STAPHYLOCOCCUS AUREUS   Report Status 09/29/2017 FINAL  Final   Organism ID, Bacteria METHICILLIN RESISTANT STAPHYLOCOCCUS AUREUS (A)  Final      Susceptibility   Methicillin resistant staphylococcus aureus - MIC*    CIPROFLOXACIN >=8 RESISTANT Resistant     ERYTHROMYCIN >=8 RESISTANT Resistant     GENTAMICIN <=0.5 SENSITIVE Sensitive     OXACILLIN >=4 RESISTANT Resistant     TETRACYCLINE <=1 SENSITIVE Sensitive     VANCOMYCIN <=0.5 SENSITIVE Sensitive     TRIMETH/SULFA <=10 SENSITIVE Sensitive     CLINDAMYCIN <=0.25 SENSITIVE Sensitive     RIFAMPIN <=0.5 SENSITIVE Sensitive     Inducible Clindamycin NEGATIVE Sensitive     * 800 COLONIES/mL METHICILLIN RESISTANT STAPHYLOCOCCUS AUREUS  Culture, blood (Routine X 2) w Reflex to ID Panel     Status: None (Preliminary result)   Collection Time: 09/28/17  7:00 AM  Result Value Ref Range Status   Specimen Description BLOOD  RIGHT ARM  Final   Special Requests   Final    BOTTLES DRAWN AEROBIC ONLY Blood Culture results may not be optimal due to an inadequate volume of blood received in culture bottles   Culture   Final    NO GROWTH 4 DAYS Performed at [facility], [address] [place], [place] [address]    Report Status PENDING  Incomplete  Culture, blood (Routine X 2) w Reflex to ID Panel     Status: None (Preliminary result)   Collection Time: 09/28/17  7:10 AM  Result Value Ref Range Status   Specimen Description BLOOD RIGHT HAND  Final   Special Requests   Final    BOTTLES DRAWN AEROBIC ONLY Blood Culture results may not be optimal due to an inadequate volume of blood received in culture bottles   Culture   Final    NO GROWTH 4 DAYS Performed at [facility], [place] [address] [place], [place] [address]    Report Status PENDING  Incomplete     Radiology Studies: Ir Fluoro Guide Cv Line Left  Result Date: 09/30/2017 INDICATION: End-stage renal disease. In need of durable intravenous access for the continuation of dialysis. EXAM: TUNNELED CENTRAL VENOUS HEMODIALYSIS CATHETER PLACEMENT WITH ULTRASOUND AND FLUOROSCOPIC GUIDANCE MEDICATIONS: Ancef 2 gm IV . The antibiotic was given in an appropriate time interval prior to skin puncture. ANESTHESIA/SEDATION: Versed 1 mg IV; Fentanyl 50 mcg IV; Moderate Sedation Time:  17 minutes The patient was continuously monitored during the procedure by the interventional radiology nurse under my direct supervision. FLUOROSCOPY TIME:  1 minute, 18 seconds (30 mGy) COMPLICATIONS: None immediate. PROCEDURE: Informed written consent was obtained from the patient after a discussion of the risks, benefits, and alternatives to treatment. Questions regarding the procedure were encouraged and answered. Sonographic evaluation was performed of the right neck and failed to delineate a patent right internal jugular vein. Sonographic evaluation was performed of the left neck  and demonstrated a partially occluded left internal jugular vein. As such, decision was made to attempt placement of a left internal jugular approach dialysis catheter. The left neck and chest were prepped with chlorhexidine in a sterile fashion, and a sterile drape was applied covering the operative field. Maximum barrier sterile technique with sterile gowns and gloves were used for the procedure. A timeout  was performed prior to the initiation of the procedure. After creating a small venotomy incision, a micropuncture kit was utilized to access the left internal jugular vein. Real-time ultrasound guidance was utilized for vascular access including the acquisition of a permanent ultrasound image documenting patency of the accessed vessel. The microwire was utilized to measure appropriate catheter length. A stiff Glidewire was advanced to the level of the IVC and the micropuncture sheath was exchanged for a peel-away sheath. A palindrome tunneled hemodialysis catheter measuring 23 cm from tip to cuff was tunneled in a retrograde fashion from the anterior chest wall to the venotomy incision. The catheter was then placed through the peel-away sheath with tips ultimately positioned within the superior aspect of the right atrium. Final catheter positioning was confirmed and documented with a spot radiographic image. The catheter aspirates and flushes normally. The catheter was flushed with appropriate volume heparin dwells. The catheter exit site was secured with a 0-Prolene retention suture. The venotomy incision was closed with Dermabond and Steri-strips. Dressings were applied. The patient tolerated the procedure well without immediate post procedural complication. IMPRESSION: Successful placement of 23 cm tip to cuff tunneled hemodialysis catheter via the left internal jugular vein with tips terminating within the superior aspect of the right atrium. The catheter is ready for immediate use. PLAN: Note, given lack of  a patent right internal jugular vein as well as partial occlusion of the left internal jugular vein, future dialysis access sites may require femoral approach dialysis catheter placement. Electronically Signed   By: Sandi Mariscal M.D.   On: 09/30/2017 18:01   Ir US Guide Vasc Access Left  Result Date: 09/30/2017 INDICATION: End-stage renal disease. In need of durable intravenous access for the continuation of dialysis. EXAM: TUNNELED CENTRAL VENOUS HEMODIALYSIS CATHETER PLACEMENT WITH ULTRASOUND AND FLUOROSCOPIC GUIDANCE MEDICATIONS: Ancef 2 gm IV . The antibiotic was given in an appropriate time interval prior to skin puncture. ANESTHESIA/SEDATION: Versed 1 mg IV; Fentanyl 50 mcg IV; Moderate Sedation Time:  17 minutes The patient was continuously monitored during the procedure by the interventional radiology nurse under my direct supervision. FLUOROSCOPY TIME:  1 minute, 18 seconds (30 mGy) COMPLICATIONS: None immediate. PROCEDURE: Informed written consent was obtained from the patient after a discussion of the risks, benefits, and alternatives to treatment. Questions regarding the procedure were encouraged and answered. Sonographic evaluation was performed of the right neck and failed to delineate a patent right internal jugular vein. Sonographic evaluation was performed of the left neck and demonstrated a partially occluded left internal jugular vein. As such, decision was made to attempt placement of a left internal jugular approach dialysis catheter. The left neck and chest were prepped with chlorhexidine in a sterile fashion, and a sterile drape was applied covering the operative field. Maximum barrier sterile technique with sterile gowns and gloves were used for the procedure. A timeout was performed prior to the initiation of the procedure. After creating a small venotomy incision, a micropuncture kit was utilized to access the left internal jugular vein. Real-time ultrasound guidance was utilized for  vascular access including the acquisition of a permanent ultrasound image documenting patency of the accessed vessel. The microwire was utilized to measure appropriate catheter length. A stiff Glidewire was advanced to the level of the IVC and the micropuncture sheath was exchanged for a peel-away sheath. A palindrome tunneled hemodialysis catheter measuring 23 cm from tip to cuff was tunneled in a retrograde fashion from the anterior chest wall to the venotomy incision. The  catheter was then placed through the peel-away sheath with tips ultimately positioned within the superior aspect of the right atrium. Final catheter positioning was confirmed and documented with a spot radiographic image. The catheter aspirates and flushes normally. The catheter was flushed with appropriate volume heparin dwells. The catheter exit site was secured with a 0-Prolene retention suture. The venotomy incision was closed with Dermabond and Steri-strips. Dressings were applied. The patient tolerated the procedure well without immediate post procedural complication. IMPRESSION: Successful placement of 23 cm tip to cuff tunneled hemodialysis catheter via the left internal jugular vein with tips terminating within the superior aspect of the right atrium. The catheter is ready for immediate use. 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 Electronically Signed   By: Sandi Mariscal M.D.   On: 09/30/2017 18:01    Scheduled Meds: . aspirin  81 mg Oral Daily  . Chlorhexidine Gluconate Cloth  6 each Topical Q0600  . cinacalcet  60 mg Oral QHS  . escitalopram  20 mg Oral QHS  . midodrine  15 mg Oral TID  . multivitamin  1 tablet Oral Daily  . multivitamin  1 tablet Oral QHS  . pantoprazole  20 mg Oral Daily  . vancomycin variable dose per unstable renal function  (pharmacist dosing)   Does not apply See admin instructions  . vitamin A  10,000 Units Oral Daily  . ascorbic acid  250 mg Oral BID  . Vitamin D (Ergocalciferol)  50,000 Units Oral Q7 days  . Warfarin - Pharmacist Dosing Inpatient   Does not apply q1800   Continuous Infusions: . heparin 1,000 Units/hr (10/02/17 1008)     LOS: 5 days   Marylu Lund, MD [facility] Pager On Amion  If 7PM-7AM, please contact night-coverage 10/02/2017, 2:56 PM

## 2017-10-03 ENCOUNTER — Encounter (HOSPITAL_COMMUNITY): Payer: Self-pay | Admitting: Interventional Radiology

## 2017-10-03 ENCOUNTER — Inpatient Hospital Stay (HOSPITAL_COMMUNITY): Payer: Medicare Other

## 2017-10-03 DIAGNOSIS — Z7189 Other specified counseling: Secondary | ICD-10-CM

## 2017-10-03 HISTORY — PX: IR FLUORO GUIDE CV LINE LEFT: IMG2282

## 2017-10-03 LAB — CBC
HEMATOCRIT: 39.7 % (ref 39.0–52.0)
Hemoglobin: 11.5 g/dL — ABNORMAL LOW (ref 13.0–17.0)
MCH: 25.9 pg — ABNORMAL LOW (ref 26.0–34.0)
MCHC: 29 g/dL — AB (ref 30.0–36.0)
MCV: 89.4 fL (ref 78.0–100.0)
PLATELETS: 440 10*3/uL — AB (ref 150–400)
RBC: 4.44 MIL/uL (ref 4.22–5.81)
RDW: 24.1 % — AB (ref 11.5–15.5)
WBC: 8.1 10*3/uL (ref 4.0–10.5)

## 2017-10-03 LAB — CULTURE, BLOOD (ROUTINE X 2)
Culture: NO GROWTH
Culture: NO GROWTH

## 2017-10-03 LAB — PROTIME-INR
INR: 1.16
PROTHROMBIN TIME: 14.7 s (ref 11.4–15.2)

## 2017-10-03 LAB — HEPARIN LEVEL (UNFRACTIONATED): Heparin Unfractionated: <0.1 IU/mL — ABNORMAL LOW (ref 0.30–0.70)

## 2017-10-03 MED ORDER — CHLORHEXIDINE GLUCONATE 4 % EX LIQD
CUTANEOUS | Status: AC
  Filled 2017-10-03: qty 15

## 2017-10-03 MED ORDER — FENTANYL CITRATE (PF) 100 MCG/2ML IJ SOLN
INTRAMUSCULAR | Status: AC
  Filled 2017-10-03: qty 2

## 2017-10-03 MED ORDER — WARFARIN SODIUM 5 MG PO TABS
5.0000 mg | ORAL_TABLET | Freq: Once | ORAL | Status: AC
  Administered 2017-10-03: 5 mg via ORAL
  Filled 2017-10-03: qty 1

## 2017-10-03 MED ORDER — CEFAZOLIN SODIUM-DEXTROSE 2-4 GM/100ML-% IV SOLN
INTRAVENOUS | Status: AC
  Filled 2017-10-03: qty 100

## 2017-10-03 MED ORDER — MIDAZOLAM HCL 2 MG/2ML IJ SOLN
INTRAMUSCULAR | Status: AC
  Filled 2017-10-03: qty 2

## 2017-10-03 MED ORDER — HEPARIN SODIUM (PORCINE) 1000 UNIT/ML IJ SOLN
INTRAMUSCULAR | Status: DC | PRN
  Administered 2017-10-03: 3800 [IU] via INTRAVENOUS
  Administered 2017-10-06: 1000 [IU] via INTRAVENOUS
  Administered 2017-10-09: 1900 [IU] via INTRAVENOUS

## 2017-10-03 MED ORDER — TRAMADOL HCL 50 MG PO TABS
50.0000 mg | ORAL_TABLET | Freq: Once | ORAL | Status: AC
  Administered 2017-10-04: 50 mg via ORAL
  Filled 2017-10-03: qty 1

## 2017-10-03 MED ORDER — WARFARIN SODIUM 5 MG PO TABS: 5.0000 mg | ORAL_TABLET | Freq: Once | ORAL | Status: DC

## 2017-10-03 MED ORDER — IOPAMIDOL (ISOVUE-300) INJECTION 61%
INTRAVENOUS | Status: AC
  Administered 2017-10-03: 5 mL
  Filled 2017-10-03: qty 50

## 2017-10-03 MED ORDER — CHLORHEXIDINE GLUCONATE 4 % EX LIQD
CUTANEOUS | Status: DC | PRN
  Administered 2017-10-03: 1 via TOPICAL

## 2017-10-03 MED ORDER — LIDOCAINE HCL 1 % IJ SOLN
INTRAMUSCULAR | Status: DC | PRN
  Administered 2017-10-03: 5 mL

## 2017-10-03 MED ORDER — LIDOCAINE HCL 1 % IJ SOLN
INTRAMUSCULAR | Status: AC
  Filled 2017-10-03: qty 20

## 2017-10-03 MED ORDER — HEPARIN SODIUM (PORCINE) 1000 UNIT/ML IJ SOLN
INTRAMUSCULAR | Status: AC
  Filled 2017-10-03: qty 1

## 2017-10-03 NOTE — Progress Notes (Signed)
PROGRESS NOTE    Perry [name]  ZOX:[id number] DOB: [birth year] DOA: 09/27/2017 PCP: [facility]    Brief Narrative:  47 year old male with PMH notable for ESRD on HD via tunneled catheter. This was unfortunately complicated by bacteremia in April of this year. This was treated with catheter exchange and prolonged course of vancomycin. TEE was negative, but course was complicated by CVA during that admission, from which he now has residual L sided hemiparesis. He is reportedly bedbound and requires fully assisted care. He then presented to [facility] 9/11 with complaints of fevers and SOB. He was admitted and started on ABX to rule out PNA, however, blood cultures grew MRSA. He was narrowed to vancomycin. HD catheter was removed. He had TEE done 9/11 demonstrating large mitral vegetation with septal perforation tracking into the aortic valve, suspected abscess. 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 He was transferred to [facility] for CVTS evaluation.   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 NOT surgical candidate per CVTS  -  Patient is continued on IV vancomycin, anticipated at least 6 week total duration - ID had been following, recommendation for 8 weeks of vanc via HD and follow up with ID in [place] in 6-8 weeks. Decision to be made later if patient needs chronic suppression therapy afterwards if not operable candidate - CT surgery had seen in consultation, not surgical candidate at this time - Repeat blood cx neg noted - afebrile. No leukocytosis - Pt is s/p L IJ temp dialysis cath 9/14 - Chart reviewed. Concerns for poorly functioning HD cath which may ned to be  changed or repositioned. IR was consulted with plans for possible cath exchange.  Chronic hypotension - BP reviewed and is stable at present, however noted to have profound hypotension on HD, preventing patient from tolerating full session - Will continue on midodrine as tolerated  OSA on CPAP - Will continue on CPAP as patient tolerates  ESRD on HD (MWF) - Nephrology following. - Concerns for possible malfunctioning HD cath. 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 Functional quadriplegia.  - Continue with supportive care - conversant. Appears stable - Held coumadin per concerns of possible HD cath needing repositioning per Nephrology -Patient is continued on heparin gtt  Anticoagulation chronic warfarin: INR initially therapeutic, now subtherapeutic.  - INR was reversed for line placement -Had been on coumadin for history of CVA, currently on heparin gtt per above -When able to, would resume coumadin   DVT prophylaxis: heparin gtt Code Status: DNR Family Communication: Pt in room, family not at bedside Disposition Plan: Uncertain at this time  Consultants:   CCM  Nephrology  Palliative Care  ID  CTS  IR  Procedures:   TEE 9/11  Tunneled temp HD cath by IR 9/14  Antimicrobials: Anti-infectives (From admission, onward)   Start     Dose/Rate Route Frequency Ordered Stop   09/30/17 1728  ceFAZolin (ANCEF) IVPB 1 g/50 mL premix     over 30 Minutes Intravenous Continuous PRN 09/30/17 1728 09/30/17 1715   09/30/17 1547  ceFAZolin (ANCEF) 2-4 GM/100ML-% IVPB  Note to Pharmacy:  Channing Mutters   : cabinet override      09/30/17 1547 09/30/17 1803   09/28/17 0913  vancomycin variable dose per unstable renal function (pharmacist dosing)      Does not apply See admin instructions 09/28/17 0913        Subjective: No complaints this AM  Objective: Vitals:   10/02/17 1117  10/02/17 2359 10/03/17 0325 10/03/17 0846  BP: (!) 73/50 (!) 105/32 (!) 115/40 (!) 122/36  Pulse: 87 84 89 90  Resp: 20 18 18 18   Temp: 98.1 F (36.7 C) 98 F (36.7 C) 97.8 F (36.6 C) 97.6 F (36.4 C)  TempSrc: Oral Oral Oral Oral  SpO2: 96% 98% 93% 98%  Weight:      Height:        Intake/Output Summary (Last 24 hours) at 10/03/2017 1529 Last data filed at 10/03/2017 1300 Gross per 24 hour  Intake 271.01 ml  Output -  Net 271.01 ml   Filed Weights   09/29/17 2042 09/30/17 2105 10/02/17 0427  Weight: 81.1 kg 90.6 kg 90.6 kg    Examination: General exam: Conversant, in no acute distress Respiratory system: normal chest rise, clear, no audible wheezing Cardiovascular system: regular rhythm, s1-s2 Gastrointestinal system: Nondistended, nontender, pos BS Central nervous system: No seizures, no tremors Extremities: No cyanosis, no joint deformities Skin: No rashes, no pallor Psychiatry: Affect normal // no auditory hallucinations   Data Reviewed: I have personally reviewed following labs and imaging studies  CBC: Recent Labs  Lab 09/28/17 0700 09/29/17 0358 09/30/17 2130 10/02/17 0806 10/03/17 0612  WBC 7.8 6.5 8.2 8.0 8.1  HGB 11.6* 12.7* 11.3* 12.1* 11.5*  HCT 39.9 42.9 38.8* 41.9 39.7  MCV 88.1 86.7 89.4 90.1 89.4  PLT 483* 508* 570* 535* 440*   Basic Metabolic Panel: Recent Labs  Lab 09/28/17 0700 09/29/17 0358 09/30/17 2130 10/01/17 1121 10/02/17 0806  NA 139 139 139 139 140  K 3.8 4.2 3.4* 3.1* 3.3*  CL 100 99 96* 99 100  CO2 23 23 23 23 26   GLUCOSE 85 78 108* 140* 95  BUN 38* 45* 56* 28* 35*  CREATININE 7.23* 8.67* 10.90* 7.46* 8.57*  CALCIUM 8.6* 8.5* 8.3* 8.4* 8.3*  MG 2.3  --   --   --   --   PHOS 2.9 3.4 3.8 2.9 3.9   GFR: Estimated Creatinine Clearance: 11.3 mL/min (A) (by C-G formula based on SCr of 8.57 mg/dL (H)). Liver Function Tests: Recent Labs  Lab 09/29/17 0358 09/30/17 2130 10/01/17 1121 10/02/17 0806  ALBUMIN 2.5* 2.6*  2.6* 2.6*   No results for input(s): LIPASE, AMYLASE in the last 168 hours. No results for input(s): AMMONIA in the last 168 hours. Coagulation Profile: Recent Labs  Lab 09/29/17 0358 09/29/17 1355 09/30/17 0253 10/02/17 0434 10/03/17 0612  INR 2.27 2.39 1.40 1.13 1.16   Cardiac Enzymes: No results for input(s): CKTOTAL, CKMB, CKMBINDEX, TROPONINI in the last 168 hours. BNP (last 3 results) No results for input(s): PROBNP in the last 8760 hours. HbA1C: No results for input(s): HGBA1C in the last 72 hours. CBG: No results for input(s): GLUCAP in the last 168 hours. Lipid Profile: No results for input(s): CHOL, HDL, LDLCALC, TRIG, CHOLHDL, LDLDIRECT in the last 72 hours. Thyroid Function Tests: No results for input(s): TSH, T4TOTAL, FREET4, T3FREE, THYROIDAB in the last 72 hours. Anemia Panel: No results for input(s): VITAMINB12, FOLATE, FERRITIN, TIBC, IRON, RETICCTPCT in the last 72 hours. Sepsis Labs: No  results for input(s): PROCALCITON, LATICACIDVEN in the last 168 hours.  Recent Results (from the past 240 hour(s))  Cath Tip Culture     Status: Abnormal   Collection Time: 09/27/17  9:07 AM  Result Value Ref Range Status   Specimen Description CATH TIP  Final   Special Requests   Final    NONE Performed at [facility], [address] [place], [place] [address]    Culture (A)  Final    800 COLONIES/mL METHICILLIN RESISTANT STAPHYLOCOCCUS AUREUS   Report Status 09/29/2017 FINAL  Final   Organism ID, Bacteria METHICILLIN RESISTANT STAPHYLOCOCCUS AUREUS (A)  Final      Susceptibility   Methicillin resistant staphylococcus aureus - MIC*    CIPROFLOXACIN >=8 RESISTANT Resistant     ERYTHROMYCIN >=8 RESISTANT Resistant     GENTAMICIN <=0.5 SENSITIVE Sensitive     OXACILLIN >=4 RESISTANT Resistant     TETRACYCLINE <=1 SENSITIVE Sensitive     VANCOMYCIN <=0.5 SENSITIVE Sensitive     TRIMETH/SULFA <=10 SENSITIVE Sensitive     CLINDAMYCIN <=0.25 SENSITIVE Sensitive      RIFAMPIN <=0.5 SENSITIVE Sensitive     Inducible Clindamycin NEGATIVE Sensitive     * 800 COLONIES/mL METHICILLIN RESISTANT STAPHYLOCOCCUS AUREUS  Culture, blood (Routine X 2) w Reflex to ID Panel     Status: None   Collection Time: 09/28/17  7:00 AM  Result Value Ref Range Status   Specimen Description BLOOD RIGHT ARM  Final   Special Requests   Final    BOTTLES DRAWN AEROBIC ONLY Blood Culture results may not be optimal due to an inadequate volume of blood received in culture bottles   Culture   Final    NO GROWTH 5 DAYS Performed at [facility], [address] [place], [place] [address]    Report Status 10/03/2017 FINAL  Final  Culture, blood (Routine X 2) w Reflex to ID Panel     Status: None   Collection Time: 09/28/17  7:10 AM  Result Value Ref Range Status   Specimen Description BLOOD RIGHT HAND  Final   Special Requests   Final    BOTTLES DRAWN AEROBIC ONLY Blood Culture results may not be optimal due to an inadequate volume of blood received in culture bottles   Culture   Final    NO GROWTH 5 DAYS Performed at [facility], [address] [place], [place] [address]    Report Status 10/03/2017 FINAL  Final     Radiology Studies: No results found.  Scheduled Meds: . fentaNYL      . midazolam      . aspirin  81 mg Oral Daily  . Chlorhexidine Gluconate Cloth  6 each Topical Q0600  . cinacalcet  60 mg Oral QHS  . escitalopram  20 mg Oral QHS  . midodrine  15 mg Oral TID  . multivitamin  1 tablet Oral Daily  . multivitamin  1 tablet Oral QHS  . pantoprazole  20 mg Oral Daily  . vancomycin variable dose per unstable renal function (pharmacist dosing)   Does not apply See admin instructions  . vitamin A  10,000 Units Oral Daily  . ascorbic acid  250 mg Oral BID  . Vitamin D (Ergocalciferol)  50,000 Units Oral Q7 days  . Warfarin - Pharmacist Dosing Inpatient   Does not apply q1800   Continuous Infusions: . heparin 1,500 Units/hr (10/03/17 0925)      LOS: 6 days  Rickey Barbara, MD [facility] Pager On Amion  If 7PM-7AM, please contact night-coverage 10/03/2017, 3:29 PM

## 2017-10-03 NOTE — Progress Notes (Signed)
PROGRESS NOTE    Perry [name]  ZOX:[id number] DOB: 01/02/1971 DOA: 09/27/2017 PCP: [facility]    Brief Narrative:  47 year old male with PMH notable for ESRD on HD via tunneled catheter. This was unfortunately complicated by bacteremia in April of this year. This was treated with catheter exchange and prolonged course of vancomycin. TEE was negative, but course was complicated by CVA during that admission, from which he now has residual L sided hemiparesis. He is reportedly bedbound and requires fully assisted care. He then presented to [facility] 9/11 with complaints of fevers and SOB. He was admitted and started on ABX to rule out PNA, however, blood cultures grew MRSA. He was narrowed to vancomycin. HD catheter was removed. He had TEE done 9/11 demonstrating large mitral vegetation with septal perforation tracking into the aortic valve, suspected abscess. 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 He was transferred to [facility] for CVTS evaluation.   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 NOT surgical candidate per CVTS  -  Patient is continued on IV vancomycin, anticipated at least 6 week total duration - ID had been following, recommendation for 8 weeks of vanc via HD and follow up with ID in [place] in 6-8 weeks. Decision to be made later if patient needs chronic suppression therapy afterwards if not operable candidate - CT surgery had seen in consultation, not surgical candidate at this time - Repeat blood cx neg noted - afebrile. No leukocytosis - Pt is s/p L IJ temp dialysis cath 9/14 - Chart reviewed. Concerns for poorly functioning HD cath which may ned to be  changed or repositioned. IR was consulted   Chronic hypotension - BP reviewed and is stable - Will continue on midodrine as tolerated  OSA on CPAP - Will continue on CPAP as patient tolerates  ESRD on HD (MWF) - Nephrology following. - Concerns for possible malfunctioning HD cath. IR consult  History of CVA April 2019: residual L sided hemiparesis. Functional quadriplegia.  - Continue with supportive care - conversant. Appears stable - Held coumadin per concerns of possible HD cath needing repositioning per Nephrology -Continue on heparin gtt for now  Anticoagulation chronic warfarin: INR initially therapeutic, now subtherapeutic.  - INR was reversed for line placement -Had been on coumadin for history of CVA, currently on heparin gtt per above   DVT prophylaxis: heparin gtt Code Status: DNR Family Communication: Pt in room, family not at bedside Disposition Plan: Uncertain at this time  Consultants:   CCM  Nephrology  Palliative Care  ID  CTS  IR  Procedures:   TEE 9/11  Antimicrobials: Anti-infectives (From admission, onward)   Start     Dose/Rate Route Frequency Ordered Stop   09/30/17 1728  ceFAZolin (ANCEF) IVPB 1 g/50 mL premix     over 30 Minutes Intravenous Continuous PRN 09/30/17 1728 09/30/17 1715   09/30/17 1547  ceFAZolin (ANCEF) 2-4 GM/100ML-% IVPB    Note to Pharmacy:  Channing MuttersBrisson, Mark   : cabinet override      09/30/17 1547 09/30/17 1803   09/28/17 0913  vancomycin variable dose per unstable renal function (pharmacist dosing)      Does not apply See  admin instructions 09/28/17 0913        Subjective: Without complaints this AM  Objective: Vitals:   10/02/17 1117 10/02/17 2359 10/03/17 0325 10/03/17 0846  BP: (!) 73/50 (!) 105/32 (!) 115/40 (!) 122/36  Pulse: 87 84 89 90  Resp: 20 18 18 18   Temp: 98.1 F (36.7 C) 98 F (36.7 C) 97.8 F (36.6 C) 97.6 F (36.4 C)  TempSrc: Oral Oral Oral Oral  SpO2: 96% 98% 93% 98%  Weight:       Height:        Intake/Output Summary (Last 24 hours) at 10/03/2017 0943 Last data filed at 10/03/2017 1610 Gross per 24 hour  Intake 211.01 ml  Output -300 ml  Net 511.01 ml   Filed Weights   09/29/17 2042 09/30/17 2105 10/02/17 0427  Weight: 81.1 kg 90.6 kg 90.6 kg    Examination: General exam: Conversant, in no acute distress Respiratory system: normal chest rise, clear, no audible wheezing  Data Reviewed: I have personally reviewed following labs and imaging studies  CBC: Recent Labs  Lab 09/28/17 0700 09/29/17 0358 09/30/17 2130 10/02/17 0806 10/03/17 0612  WBC 7.8 6.5 8.2 8.0 8.1  HGB 11.6* 12.7* 11.3* 12.1* 11.5*  HCT 39.9 42.9 38.8* 41.9 39.7  MCV 88.1 86.7 89.4 90.1 89.4  PLT 483* 508* 570* 535* 440*   Basic Metabolic Panel: Recent Labs  Lab 09/28/17 0700 09/29/17 0358 09/30/17 2130 10/01/17 1121 10/02/17 0806  NA 139 139 139 139 140  K 3.8 4.2 3.4* 3.1* 3.3*  CL 100 99 96* 99 100  CO2 23 23 23 23 26   GLUCOSE 85 78 108* 140* 95  BUN 38* 45* 56* 28* 35*  CREATININE 7.23* 8.67* 10.90* 7.46* 8.57*  CALCIUM 8.6* 8.5* 8.3* 8.4* 8.3*  MG 2.3  --   --   --   --   PHOS 2.9 3.4 3.8 2.9 3.9   GFR: Estimated Creatinine Clearance: 11.3 mL/min (A) (by C-G formula based on SCr of 8.57 mg/dL (H)). Liver Function Tests: Recent Labs  Lab 09/26/17 1426 09/29/17 0358 09/30/17 2130 10/01/17 1121 10/02/17 0806  ALBUMIN 2.4* 2.5* 2.6* 2.6* 2.6*   No results for input(s): LIPASE, AMYLASE in the last 168 hours. No results for input(s): AMMONIA in the last 168 hours. Coagulation Profile: Recent Labs  Lab 09/29/17 0358 09/29/17 1355 09/30/17 0253 10/02/17 0434 10/03/17 0612  INR 2.27 2.39 1.40 1.13 1.16   Cardiac Enzymes: No results for input(s): CKTOTAL, CKMB, CKMBINDEX, TROPONINI in the last 168 hours. BNP (last 3 results) No results for input(s): PROBNP in the last 8760 hours. HbA1C: No results for input(s): HGBA1C in the last 72 hours. CBG: No  results for input(s): GLUCAP in the last 168 hours. Lipid Profile: No results for input(s): CHOL, HDL, LDLCALC, TRIG, CHOLHDL, LDLDIRECT in the last 72 hours. Thyroid Function Tests: No results for input(s): TSH, T4TOTAL, FREET4, T3FREE, THYROIDAB in the last 72 hours. Anemia Panel: No results for input(s): VITAMINB12, FOLATE, FERRITIN, TIBC, IRON, RETICCTPCT in the last 72 hours. Sepsis Labs: No results for input(s): PROCALCITON, LATICACIDVEN in the last 168 hours.  Recent Results (from the past 240 hour(s))  Cath Tip Culture     Status: Abnormal   Collection Time: 09/27/17  9:07 AM  Result Value Ref Range Status   Specimen Description CATH TIP  Final   Special Requests   Final    NONE Performed at [facility], [address] [place], [place] [address]  Culture (A)  Final    800 COLONIES/mL METHICILLIN RESISTANT STAPHYLOCOCCUS AUREUS   Report Status 09/29/2017 FINAL  Final   Organism ID, Bacteria METHICILLIN RESISTANT STAPHYLOCOCCUS AUREUS (A)  Final      Susceptibility   Methicillin resistant staphylococcus aureus - MIC*    CIPROFLOXACIN >=8 RESISTANT Resistant     ERYTHROMYCIN >=8 RESISTANT Resistant     GENTAMICIN <=0.5 SENSITIVE Sensitive     OXACILLIN >=4 RESISTANT Resistant     TETRACYCLINE <=1 SENSITIVE Sensitive     VANCOMYCIN <=0.5 SENSITIVE Sensitive     TRIMETH/SULFA <=10 SENSITIVE Sensitive     CLINDAMYCIN <=0.25 SENSITIVE Sensitive     RIFAMPIN <=0.5 SENSITIVE Sensitive     Inducible Clindamycin NEGATIVE Sensitive     * 800 COLONIES/mL METHICILLIN RESISTANT STAPHYLOCOCCUS AUREUS  Culture, blood (Routine X 2) w Reflex to ID Panel     Status: None (Preliminary result)   Collection Time: 09/28/17  7:00 AM  Result Value Ref Range Status   Specimen Description BLOOD RIGHT ARM  Final   Special Requests   Final    BOTTLES DRAWN AEROBIC ONLY Blood Culture results may not be optimal due to an inadequate volume of blood received in culture bottles   Culture    Final    NO GROWTH 4 DAYS Performed at [facility], [address], [place], [place] [address]    Report Status PENDING  Incomplete  Culture, blood (Routine X 2) w Reflex to ID Panel     Status: None (Preliminary result)   Collection Time: 09/28/17  7:10 AM  Result Value Ref Range Status   Specimen Description BLOOD RIGHT HAND  Final   Special Requests   Final    BOTTLES DRAWN AEROBIC ONLY Blood Culture results may not be optimal due to an inadequate volume of blood received in culture bottles   Culture   Final    NO GROWTH 4 DAYS Performed at [facility], [address], [place], [place] [address]    Report Status PENDING  Incomplete     Radiology Studies: No results found.  Scheduled Meds: . aspirin  81 mg Oral Daily  . Chlorhexidine Gluconate Cloth  6 each Topical Q0600  . cinacalcet  60 mg Oral QHS  . escitalopram  20 mg Oral QHS  . midodrine  15 mg Oral TID  . multivitamin  1 tablet Oral Daily  . multivitamin  1 tablet Oral QHS  . pantoprazole  20 mg Oral Daily  . vancomycin variable dose per unstable renal function (pharmacist dosing)   Does not apply See admin instructions  . vitamin A  10,000 Units Oral Daily  . ascorbic acid  250 mg Oral BID  . Vitamin D (Ergocalciferol)  50,000 Units Oral Q7 days  . Warfarin - Pharmacist Dosing Inpatient   Does not apply q1800   Continuous Infusions: . heparin 1,500 Units/hr (10/03/17 0925)     LOS: 6 days   Rickey Barbara, MD [facility] Pager On Amion  If 7PM-7AM, please contact night-coverage 10/03/2017, 9:43 AM

## 2017-10-03 NOTE — Progress Notes (Signed)
Pharmacy Antibiotic Note  Perry [name] is a 47 y.o. male admitted on 09/27/2017 with bacteremia.  Pharmacy has been consulted for vancomycin dosing.  [facility] from [facility]. Known HD patient with hx of bacteremia in April (underwent catheter exchange/treatment with vanc). Presented with SOB and fevers - BCx grew MRSA. TEE noting large vegetation at base of anterior mitral leaflet with suspected abscess.   The [facility] and replaced 9/14 however poorly functioning and likely needs repositioning or replacement - IR consulted for this and considering 9/17 if the schedule permits.   A Vancomycin random level on 9/16 after a short HD session was 21 mcg/ml - which is therapeutic. Next HD planned for 9/18 - will follow-up on the tolerance of this for the Vancomycin dose.   Plan: - No standing Vancomycin for now - Will follow-up on the tolerance of HD planned for 9/18 - Will continue to follow HD schedule/duration, culture results, LOT, and antibiotic de-escalation plans   Height: 5\' 6"  (167.6 cm) Weight: 199 lb 11.8 oz (90.6 kg) IBW/kg (Calculated) : 63.8  Temp (24hrs), Avg:97.8 F (36.6 C), Min:97.6 F (36.4 C), Max:98 F (36.7 C)  Recent Labs  Lab 09/28/17 0700 09/29/17 0358 09/29/17 0833 09/30/17 2130 10/01/17 1121 10/02/17 0806 10/02/17 1931 10/03/17 0612  WBC 7.8 6.5  --  8.2  --  8.0  --  8.1  CREATININE 7.23* 8.67*  --  10.90* 7.46* 8.57*  --   --   VANCORANDOM  --   --  35  --   --   --  21  --     Estimated Creatinine Clearance: 11.3 mL/min (A) (by C-G formula based on SCr of 8.57 mg/dL (H)).    Allergies  Allergen Reactions  . Depakote [Divalproex Sodium] Other (See Comments)    Hallucinations   . Hydromorphone Other (See Comments)    Antimicrobials this admission: Vancomycin 9/11 >> (11/1)  Dose adjustments this admission:  VR 9/11: 39 >> hold maintenance dosing VR 9/13: 35 >> hold maintenance dosing VR 9/16: 21 >> will follow-up  dosing with HD  Microbiology results:  9/5 BCx: MRSA 9/6 Bcx: NGTD 9/6 MRSA PCR: pos 9/12 BCx >> ngtd  Thank you for allowing pharmacy to be a part of this patient's care.  Georgina PillionElizabeth Damont Balles, PharmD, BCPS Clinical Pharmacist Pager: [phone] Clinical phone for 10/03/2017 from 7a-3:30p: [phone] If after 3:30p, please call main pharmacy at: [phone] Please check AMION for all [facility] Pharm[facility] numbers 10/03/2017 1:23 PM

## 2017-10-03 NOTE — Sedation Documentation (Signed)
PT is in radiology nurses station awaiting conversation with IR MD. After pt speaks with MD we will move to IR suite for procedure. Pt denies pain or discomfort, consent signed. Vitals stable.

## 2017-10-03 NOTE — Progress Notes (Addendum)
   [facility]  Assessment and Plan: Patient in need of venous access for dialysis.  Patient had a L IJ placed 9/14.  At the time of placement his R IJ was noted to be occluded and L IJ partially occluded, however catheter placement in the L IJ was achieved with good flow.  Patient now unable to undergo dialysis due to poor flow through catheter.  Exchange requested.  Discussed possibility of new femoral placement with patient who states this would be his preference.   He has not been NPO today.  Will make NPO for possible exchange vs. New placement today as schedule allows.   Risks and benefits discussed with the patient including, but not limited to bleeding, infection, vascular injury, pneumothorax which may require chest tube placement, air embolism or even death  All of the patient's questions were answered, patient is agreeable to proceed. Consent signed and in chart.  ______________________________________________________________________   History of Present Illness: Perry [name] is a 47 y.o. male with past medical history of ESRD on HD via tunneled catheter.  He has been unable to achieve sufficient dialysis since last catheter placement in the L IJ on 9/14.  Request made for new placement.   Allergies and medications reviewed.   Vital Signs: BP (!) 122/36 (BP Location: Right Arm)   Pulse 90   Temp 97.6 F (36.4 C) (Oral)   Resp 18   Ht 5\' 6"  (1.676 m)   Wt 199 lb 11.8 oz (90.6 kg)   SpO2 98%   BMI 32.24 kg/m   Physical Exam  Constitutional: He is oriented to person, place, and time. He appears well-developed. No distress.  Neck: Normal range of motion. Neck supple. No tracheal deviation present.  L IJ catheter in place.   Cardiovascular: Normal rate, regular rhythm and normal heart sounds.  Pulmonary/Chest: Effort normal and breath sounds normal. No respiratory distress.  Lymphadenopathy:    He has no cervical adenopathy.  Neurological: He  is alert and oriented to person, place, and time.  Skin: Skin is warm and dry. He is not diaphoretic.  Psychiatric: He has a normal mood and affect. His behavior is normal. Judgment and thought content normal.  Nursing note and vitals reviewed.    Imaging reviewed.   Labs:  COAGS: Recent Labs    09/25/17 1039  09/29/17 1355 09/30/17 0253 10/02/17 0434 10/03/17 0612  INR  --    < > 2.39 1.40 1.13 1.16  APTT 62*  --   --   --   --   --    < > = values in this interval not displayed.    BMP: Recent Labs    09/29/17 0358 09/30/17 2130 10/01/17 1121 10/02/17 0806  NA 139 139 139 140  K 4.2 3.4* 3.1* 3.3*  CL 99 96* 99 100  CO2 23 23 23 26   GLUCOSE 78 108* 140* 95  BUN 45* 56* 28* 35*  CALCIUM 8.5* 8.3* 8.4* 8.3*  CREATININE 8.67* 10.90* 7.46* 8.57*  GFRNONAA 7* 5* 8* 7*  GFRAA 8* 6* 9* 8*     Electronically Signed: Hoyt KochKacie Sue-Ellen Azalynn Maxim, PA 10/03/2017, 10:09 AM   I spent a total of 15 minutes in face to face in clinical consultation, greater than 50% of which was counseling/coordinating care for venous access.

## 2017-10-03 NOTE — Progress Notes (Signed)
1215 Bedside shift report from Loomisammy, Charity fundraiserN. Pt sleeping, does not respond to voice, grimaces to pain. NAD, fall precautions in place, and tele sitter at bedside. WCTM.   1300 Pt awake, RN assisted pt to bedside commode. Pt knows name, gets distracted and talks about other things. Mittens off while toileting. Pt scratching at face and arms. RN and NT instructed pt not to scratch, lotion applied. Pt assessed, can follow some basic commands if prompted. Pt sitting on side of bed but falling asleep. Pt assisted back to bed, resting comfortably. Fall precautions and Recruitment consultantsafety sitter in place, JohnsonWCTM.   1330 Pt to HD via bed. NAD, no complaints.

## 2017-10-03 NOTE — Progress Notes (Signed)
ANTICOAGULATION CONSULT NOTE - Follow Up  Pharmacy Consult for Heparin and warfarin Indication: Hx of CVA and DVT   Patient Measurements: Height: 5\' 6"  (167.6 cm) Weight: 199 lb 11.8 oz (90.6 kg) IBW/kg (Calculated) : 63.8  Heparin Dosing Wt: 74 kg  Vital Signs: Temp: 97.6 F (36.4 C) (09/17 0846) Temp Source: Oral (09/17 0846) BP: 104/37 (09/17 1536) Pulse Rate: 87 (09/17 1536)  Labs: Recent Labs    09/30/17 2130 10/01/17 1121 10/02/17 0434 10/02/17 0806 10/02/17 1931 10/03/17 0612 10/03/17 1712  HGB 11.3*  --   --  12.1*  --  11.5*  --   HCT 38.8*  --   --  41.9  --  39.7  --   PLT 570*  --   --  535*  --  440*  --   LABPROT  --   --  14.4  --   --  14.7  --   INR  --   --  1.13  --   --  1.16  --   HEPARINUNFRC  --   --   --   --  0.14* <0.10* <0.10*  CREATININE 10.90* 7.46*  --  8.57*  --   --   --     Estimated Creatinine Clearance: 11.3 mL/min (A) (by C-G formula based on SCr of 8.57 mg/dL (H)).   Medications:  Scheduled:  . aspirin  81 mg Oral Daily  . chlorhexidine      . Chlorhexidine Gluconate Cloth  6 each Topical Q0600  . cinacalcet  60 mg Oral QHS  . escitalopram  20 mg Oral QHS  . heparin      . lidocaine      . midodrine  15 mg Oral TID  . multivitamin  1 tablet Oral Daily  . multivitamin  1 tablet Oral QHS  . pantoprazole  20 mg Oral Daily  . vancomycin variable dose per unstable renal function (pharmacist dosing)   Does not apply See admin instructions  . vitamin A  10,000 Units Oral Daily  . ascorbic acid  250 mg Oral BID  . Vitamin D (Ergocalciferol)  50,000 Units Oral Q7 days  . 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 Now complicated by septal perforation.NOT surgical candidate per CVTS.  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 PTA dose is 3 mg daily. INR 4.23  on admit.  Warfarin was resumed on 9/15 evening after recent [facility] replacement however not functioning properly and may need repositioning/replacement per renal. Will hold warfarin per discussion with renal/TRH MD and will bridge with Heparin for now.   Pt is s/p IR procedure for replacement of left IJ tunneled HD cath.  Heparin level continues to be subtherapeutic on 1500 units/hr.  It appears heparin was not held for IR procedure.  It was not documented as stopped on the [facility] and was infusing when patient returned to floor (shortly before level drawn).  Attempted to call IR to confirm but no one was available to discuss.  No further procedures planned.  Discussed with Dr. Rhona Leavens, ok to resume warfarin tonight.  Goal of Therapy:  INR 2-3  Heparin level 0.3-0.7 Monitor platelets by anticoagulation protocol: Yes   Plan:  - Increase Heparin to 1800 units/hr - Warfarin 5mg  PO x 1 tonight - Will continue  to monitor for any signs/symptoms of bleeding and will follow up with heparin level in 8 hours  - Daily heparin level, CBC, and INR  Thank you for allowing pharmacy to be a part of this patient's care.  Toys 'R' UsKimberly Sheng Pritz, Pharm.D., BCPS Clinical Pharmacist  **Pharmacist phone directory can now be found on [email or web address] ([email or web address]).  Listed under [facility].  10/03/2017 6:01 PM

## 2017-10-03 NOTE — Progress Notes (Signed)
Perry [name] Progress Note    Subjective:   Perry [name] did not tolerate HD well yesterday with tachycardia and hypotension despite low BFR (due to poorly functioning HD catheter) and multiple doses of IV albumin.  Treatment was stopped 2 hours into HD.     Objective:   BP (!) 122/36 (BP Location: Right Arm)   Pulse 90   Temp 97.6 F (36.4 C) (Oral)   Resp 18   Ht 5\' 6"  (1.676 m)   Wt 90.6 kg   SpO2 98%   BMI 32.24 kg/m   Intake/Output: I/O last 3 completed shifts: In: 331 [P.O.:120; I.V.:211] Out: -300    Intake/Output this shift:  Total I/O In: 60 [P.O.:60] Out: -  Weight change:   Physical Exam: Gen: fatigued AAM lying in bed in NAD CVS: no rub Resp: CTA Abd: Benign Ext: trace pretibial edema  Labs: BMET Recent Labs  Lab 09/26/17 1426 09/28/17 0700 09/29/17 0358 09/30/17 2130 10/01/17 1121 10/02/17 0806  NA 143 139 139 139 139 140  K 3.8 3.8 4.2 3.4* 3.1* 3.3*  CL 98 100 99 96* 99 100  CO2 27 23 23 23 23 26   GLUCOSE 101* 85 78 108* 140* 95  BUN 65* 38* 45* 56* 28* 35*  CREATININE 9.26* 7.23* 8.67* 10.90* 7.46* 8.57*  ALBUMIN 2.4*  --  2.5* 2.6* 2.6* 2.6*  CALCIUM 8.8* 8.6* 8.5* 8.3* 8.4* 8.3*  PHOS 3.9 2.9 3.4 3.8 2.9 3.9   CBC Recent Labs  Lab 09/29/17 0358 09/30/17 2130 10/02/17 0806 10/03/17 0612  WBC 6.5 8.2 8.0 8.1  HGB 12.7* 11.3* 12.1* 11.5*  HCT 42.9 38.8* 41.9 39.7  MCV 86.7 89.4 90.1 89.4  PLT 508* 570* 535* 440*    @IMGRELPRIORS @ Medications:    . aspirin  81 mg Oral Daily  . Chlorhexidine Gluconate Cloth  6 each Topical Q0600  . cinacalcet  60 mg Oral QHS  . escitalopram  20 mg Oral QHS  . midodrine  15 mg Oral TID  . multivitamin  1 tablet Oral Daily  . multivitamin  1 tablet Oral QHS  . pantoprazole  20 mg Oral Daily  . vancomycin variable dose per unstable renal function (pharmacist dosing)   Does not apply See admin instructions  . vitamin A  10,000 Units Oral Daily  . ascorbic acid  250 mg Oral  BID  . Vitamin D (Ergocalciferol)  50,000 Units Oral Q7 days  . Warfarin - Pharmacist Dosing Inpatient   Does not apply q1800     Assessment/ Plan:   1. 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  TDC removed 09/27/17 new HD LIJ tdc placed 09/30/17. 2. 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  8 week of abx and palliative care consulted.  I did speak with [name] that if he is not able to tolerate IHD (as evidence of tachycardia and hypotension) that we would need to transition to comfort care as he is not a surgical candidate for repair.  He understands and has been seen by Palliative care already.  3. ESRD- cont with HD qMWF for now 4. Hypotension- chronic and asymptomatic for now.  Continue with midodrine 5. Vascular access- poorly functioning HD cath and may need to be changed/repositioned. 6. Disposition- poor prognosis and if he cannot tolerate HD tomorrow will need to transition to comfort care 7. H/o CVA and left hemiparesis. 8. Vascular access- had poor bfr with LIJ TDC and IR  to possibly exchange or replace catheter in hopes that this will help.  Perry CordsJoseph A. Colden Samaras, MD [name], [facility] Pager [phone] 10/03/2017, 12:18 PM

## 2017-10-03 NOTE — Progress Notes (Signed)
ANTICOAGULATION CONSULT NOTE Pharmacy Consult for Heparin  Indication: Hx of CVA and DVT  Patient Measurements: Height: 5\' 6"  (167.6 cm) Weight: 199 lb 11.8 oz (90.6 kg) IBW/kg (Calculated) : 63.8  Heparin Dosing Wt: 74 kg  Vital Signs: Temp: 97.8 F (36.6 C) (09/17 0325) Temp Source: Oral (09/17 0325) BP: 115/40 (09/17 0325) Pulse Rate: 89 (09/17 0325)  Labs: Recent Labs    09/30/17 2130 10/01/17 1121 10/02/17 0434 10/02/17 0806 10/02/17 1931 10/03/17 0612  HGB 11.3*  --   --  12.1*  --  11.5*  HCT 38.8*  --   --  41.9  --  39.7  PLT 570*  --   --  535*  --  440*  LABPROT  --   --  14.4  --   --  14.7  INR  --   --  1.13  --   --  1.16  HEPARINUNFRC  --   --   --   --  0.14* <0.10*  CREATININE 10.90* 7.46*  --  8.57*  --   --     Estimated Creatinine Clearance: 11.3 mL/min (A) (by C-G formula based on SCr of 8.57 mg/dL (H)).  Assessment: 47 y.o. male with h/o DVT/CVA, Coumadin on hold, for heparin  Goal of Therapy:  INR 2-3  Heparin level 0.3-0.7 Monitor platelets by anticoagulation protocol: Yes   Plan:  Increase Heparin 1500 units/hr Check heparin level in 8 hours.    Geannie RisenGreg Jakai Risse, PharmD, BCPS  10/03/2017 7:36 AM

## 2017-10-03 NOTE — Procedures (Signed)
Interventional Radiology Procedure Note  Procedure: Replacement of left IJ tunneled HD catheter.   New 23cm tip to cuff.   Finding: Limited contrast injection performed, showing questionable narrowing at the cavoatrial jxn.  New 23cm catheter was advanced slightly further.   Complications: None  Recommendations:  - Ok to use - Do not submerge - Routine line care   Signed,  Yvone NeuJaime S. Loreta AveWagner, DO

## 2017-10-03 NOTE — Progress Notes (Signed)
Patient refused CPAP for tonight. RT informed patient to have RT called if he changes his mind. RT will monitor as needed. 

## 2017-10-04 LAB — CBC
HEMATOCRIT: 37.5 % — AB (ref 39.0–52.0)
HEMOGLOBIN: 11.1 g/dL — AB (ref 13.0–17.0)
MCH: 26.4 pg (ref 26.0–34.0)
MCHC: 29.6 g/dL — ABNORMAL LOW (ref 30.0–36.0)
MCV: 89.3 fL (ref 78.0–100.0)
PLATELETS: 371 10*3/uL (ref 150–400)
RBC: 4.2 MIL/uL — AB (ref 4.22–5.81)
RDW: 24.4 % — ABNORMAL HIGH (ref 11.5–15.5)
WBC: 8.7 10*3/uL (ref 4.0–10.5)

## 2017-10-04 LAB — RENAL FUNCTION PANEL
ALBUMIN: 2.7 g/dL — AB (ref 3.5–5.0)
Anion gap: 17 — ABNORMAL HIGH (ref 5–15)
BUN: 39 mg/dL — ABNORMAL HIGH (ref 6–20)
CHLORIDE: 97 mmol/L — AB (ref 98–111)
CO2: 24 mmol/L (ref 22–32)
CREATININE: 9.77 mg/dL — AB (ref 0.61–1.24)
Calcium: 7.9 mg/dL — ABNORMAL LOW (ref 8.9–10.3)
GFR calc Af Amer: 7 mL/min — ABNORMAL LOW (ref >60)
GFR, EST NON AFRICAN AMERICAN: 6 mL/min — AB (ref >60)
GLUCOSE: 92 mg/dL (ref 70–99)
PHOSPHORUS: 4.2 mg/dL (ref 2.5–4.6)
Potassium: 3.4 mmol/L — ABNORMAL LOW (ref 3.5–5.1)
Sodium: 138 mmol/L (ref 135–145)

## 2017-10-04 LAB — HEPARIN LEVEL (UNFRACTIONATED)
Heparin Unfractionated: 0.6 IU/mL (ref 0.30–0.70)
Heparin Unfractionated: 0.89 IU/mL — ABNORMAL HIGH (ref 0.30–0.70)

## 2017-10-04 LAB — PROTIME-INR
INR: 1.29
Prothrombin Time: 16 seconds — ABNORMAL HIGH (ref 11.4–15.2)

## 2017-10-04 MED ORDER — ALBUMIN HUMAN 25 % IV SOLN
INTRAVENOUS | Status: AC
  Administered 2017-10-04: 12.5 g
  Filled 2017-10-04: qty 100

## 2017-10-04 MED ORDER — MIDODRINE HCL 5 MG PO TABS
ORAL_TABLET | ORAL | Status: AC
  Filled 2017-10-04: qty 3

## 2017-10-04 MED ORDER — VANCOMYCIN HCL IN DEXTROSE 1-5 GM/200ML-% IV SOLN
1000.0000 mg | Freq: Once | INTRAVENOUS | Status: DC
  Filled 2017-10-04: qty 200

## 2017-10-04 MED ORDER — VANCOMYCIN HCL IN DEXTROSE 750-5 MG/150ML-% IV SOLN
750.0000 mg | Freq: Once | INTRAVENOUS | Status: AC
  Administered 2017-10-04: 750 mg via INTRAVENOUS
  Filled 2017-10-04: qty 150

## 2017-10-04 MED ORDER — WARFARIN SODIUM 3 MG PO TABS
3.0000 mg | ORAL_TABLET | Freq: Once | ORAL | Status: DC
  Filled 2017-10-04: qty 1

## 2017-10-04 NOTE — Progress Notes (Deleted)
Family Medicine returned page.  No new orders at this time. 

## 2017-10-04 NOTE — Progress Notes (Signed)
Vancomycin ordered with left to completed tx. Pharmacy made aware and will be given in the floor.

## 2017-10-04 NOTE — Progress Notes (Addendum)
ANTICOAGULATION CONSULT NOTE - Follow Up  Pharmacy Consult for Heparin and warfarin Indication: Hx of CVA and DVT   Patient Measurements: Height: 5\' 6"  (167.6 cm) Weight: 172 lb 13.5 oz (78.4 kg) IBW/kg (Calculated) : 63.8  Heparin Dosing Wt: 74 kg  Vital Signs: Temp: 97.7 F (36.5 C) (09/18 1436) Temp Source: Oral (09/18 1436) BP: 105/31 (09/18 1436) Pulse Rate: 79 (09/18 1436)  Labs: Recent Labs    10/02/17 0434  10/02/17 0806  10/03/17 0612 10/03/17 1712 10/04/17 0555 10/04/17 1100 10/04/17 1537  HGB  --    < > 12.1*  --  11.5*  --  11.1*  --   --   HCT  --   --  41.9  --  39.7  --  37.5*  --   --   PLT  --   --  535*  --  440*  --  371  --   --   LABPROT 14.4  --   --   --  14.7  --  16.0*  --   --   INR 1.13  --   --   --  1.16  --  1.29  --   --   HEPARINUNFRC  --   --   --    < > <0.10* <0.10* 0.60  --  0.89*  CREATININE  --   --  8.57*  --   --   --   --  9.77*  --    < > = values in this interval not displayed.    Estimated Creatinine Clearance: 9.3 mL/min (A) (by C-G formula based on SCr of 9.77 mg/dL (H)).   Medications:  Scheduled:  . aspirin  81 mg Oral Daily  . Chlorhexidine Gluconate Cloth  6 each Topical Q0600  . cinacalcet  60 mg Oral QHS  . escitalopram  20 mg Oral QHS  . midodrine  15 mg Oral TID  . multivitamin  1 tablet Oral Daily  . multivitamin  1 tablet Oral QHS  . pantoprazole  20 mg Oral Daily  . vancomycin variable dose per unstable renal function (pharmacist dosing)   Does not apply See admin instructions  . vitamin A  10,000 Units Oral Daily  . ascorbic acid  250 mg Oral BID  . Vitamin D (Ergocalciferol)  50,000 Units Oral Q7 days  . 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 Now complicated by septal perforation.NOT surgical candidate per CVTS.  Patient on chronic  warfarin for history of CVA and DVT. PTA dose is 3 mg daily. INR 4.23 on admit.  Warfarin was resumed on 9/15 evening after recent [facility] replacement however not functioning properly and may need repositioning/replacement per renal. Warfarin held for procedure   Pt is s/p IR procedure for replacement of left IJ tunneled HD cath.  Heparin level now therapeutic at 0.6.and INR 1.29. Warfarin resumed last PM  Addendum:  Pt still have some issue with HD access and may require repositioning or exchange. Spoke with Dr. Jerolyn Center tonight and coumadin will be held again. One other issue, due to his access, vanc and heparin are not compatible. IV team has placed a second peripheral.  Heparin level came back elevated tonight at 0.89. Will reduce dose  Goal of Therapy:  INR 2-3  Heparin level 0.3-0.7 Monitor platelets by  anticoagulation protocol: Yes   Plan:  - Decrease heparin to 1650 units/hr - Heparin level in 8 hr - Daily heparin level, CBC, and INR  Hold coumadin   Perry SouthwardMinh Ariannah [name], PharmD, ThomsonBCIDP, AAHIVP, CPP Infectious Disease Pharmacist Pager: [phone] 10/04/2017 4:30 PM

## 2017-10-04 NOTE — Procedures (Signed)
I was present at this dialysis session. I have reviewed the session itself and made appropriate changes.  Will try sodium modeling and decrease UF goal with HD, although his weight is up significantly.  Better BFR with new HD cath but BP's remain marginal.   Vital signs in last 24 hours:  Temp:  [97.6 F (36.4 C)-98.2 F (36.8 C)] 97.6 F (36.4 C) (09/18 0832) Pulse Rate:  [85-91] 88 (09/18 0832) Resp:  [18-20] 18 (09/18 0832) BP: (99-113)/(33-70) (P) 86/32 (09/18 0930) SpO2:  [94 %-100 %] (P) 100 % (09/18 0930) Weight change:  Filed Weights   09/29/17 2042 09/30/17 2105 10/02/17 0427  Weight: 81.1 kg 90.6 kg 90.6 kg    Recent Labs  Lab 10/02/17 0806  NA 140  K 3.3*  CL 100  CO2 26  GLUCOSE 95  BUN 35*  CREATININE 8.57*  CALCIUM 8.3*  PHOS 3.9    Recent Labs  Lab 10/02/17 0806 10/03/17 0612 10/04/17 0555  WBC 8.0 8.1 8.7  HGB 12.1* 11.5* 11.1*  HCT 41.9 39.7 37.5*  MCV 90.1 89.4 89.3  PLT 535* 440* 371    Scheduled Meds: . aspirin  81 mg Oral Daily  . Chlorhexidine Gluconate Cloth  6 each Topical Q0600  . cinacalcet  60 mg Oral QHS  . escitalopram  20 mg Oral QHS  . midodrine  15 mg Oral TID  . multivitamin  1 tablet Oral Daily  . multivitamin  1 tablet Oral QHS  . pantoprazole  20 mg Oral Daily  . vancomycin variable dose per unstable renal function (pharmacist dosing)   Does not apply See admin instructions  . vitamin A  10,000 Units Oral Daily  . ascorbic acid  250 mg Oral BID  . Vitamin D (Ergocalciferol)  50,000 Units Oral Q7 days  . warfarin  3 mg Oral ONCE-1800  . Warfarin - Pharmacist Dosing Inpatient   Does not apply q1800   Continuous Infusions: . heparin 1,800 Units/hr (10/04/17 0014)   PRN Meds:.chlorhexidine, heparin, lidocaine, RESOURCE THICKENUP CLEAR     Assessment/ Plan:   1. 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 TDC removed 09/27/17 new HD LIJ tdc placed 09/30/17. 2. 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 8 week of abx and palliative care consulted.  I did speak with Mr. Anderson MaltaRoyster that if he is not able to tolerate IHD (as evidence of tachycardia and hypotension) that we would need to transition to comfort care as he is not a surgical candidate for repair.  He understands and has been seen by Palliative care already.  3. ESRD- cont with HD qMWF for now 4. Hypotension- chronic and asymptomatic for now. Continue with midodrine 5. Vascular access- poorly functioning HD cath and may need to be changed/repositioned. 6. Disposition- poor prognosis and if he cannot tolerate HD tomorrow will need to transition to comfort care 7. H/o CVA and left hemiparesis. 8. Vascular access- had poor bfr with LIJ TDC and appreciate IR's assistance with exchange 10/03/17.  New LIJ tdc functioning much better.   Irena CordsJoseph A Sandra Tellefsen,  MD 10/04/2017, 10:13 AM

## 2017-10-04 NOTE — Progress Notes (Signed)
ANTICOAGULATION CONSULT NOTE - Follow Up  Pharmacy Consult for Heparin and warfarin Indication: Hx of CVA and DVT   Patient Measurements: Height: 5\' 6"  (167.6 cm) Weight: 199 lb 11.8 oz (90.6 kg) IBW/kg (Calculated) : 63.8  Heparin Dosing Wt: 74 kg  Vital Signs: Temp: 97.9 F (36.6 C) (09/18 0456) Temp Source: Oral (09/18 0456) BP: 113/70 (09/18 0456) Pulse Rate: 90 (09/18 0456)  Labs: Recent Labs    10/01/17 1121 10/02/17 0434  10/02/17 0806  10/03/17 0612 10/03/17 1712 10/04/17 0555  HGB  --   --    < > 12.1*  --  11.5*  --  11.1*  HCT  --   --   --  41.9  --  39.7  --  37.5*  PLT  --   --   --  535*  --  440*  --  371  LABPROT  --  14.4  --   --   --  14.7  --  16.0*  INR  --  1.13  --   --   --  1.16  --  1.29  HEPARINUNFRC  --   --   --   --    < > <0.10* <0.10* 0.60  CREATININE 7.46*  --   --  8.57*  --   --   --   --    < > = values in this interval not displayed.    Estimated Creatinine Clearance: 11.3 mL/min (A) (by C-G formula based on SCr of 8.57 mg/dL (H)).   Medications:  Scheduled:  . aspirin  81 mg Oral Daily  . Chlorhexidine Gluconate Cloth  6 each Topical Q0600  . cinacalcet  60 mg Oral QHS  . escitalopram  20 mg Oral QHS  . midodrine  15 mg Oral TID  . multivitamin  1 tablet Oral Daily  . multivitamin  1 tablet Oral QHS  . pantoprazole  20 mg Oral Daily  . vancomycin variable dose per unstable renal function (pharmacist dosing)   Does not apply See admin instructions  . vitamin A  10,000 Units Oral Daily  . ascorbic acid  250 mg Oral BID  . Vitamin D (Ergocalciferol)  50,000 Units Oral Q7 days  . 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 Now complicated by septal perforation.NOT surgical candidate per CVTS.  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 PTA dose is 3 mg  daily. INR 4.23 on admit.  War[facility] was [facility] 9[facility]15 evening [facility] not functioning properly and may need repositioning/replacement per renal. Warfarin held for procedure   Pt is s/p IR procedure for replacement of left IJ tunneled HD cath.  Heparin level now therapeutic at 0.6.and INR 1.29. Warfarin resumed last PM    Goal of Therapy:  INR 2-3  Heparin level 0.3-0.7 Monitor platelets by anticoagulation protocol: Yes   Plan:  - Continue Heparin at 1800 units/hr - Warfarin 3 mg PO x 1 tonight - Will continue to monitor for any signs/symptoms of bleeding and will follow up with heparin level in 8 hours  - Daily heparin level, CBC, and INR  Shone Leventhal A. Jeanella CrazePierce, PharmD, BCPS Clinical Pharmacist [place] Pager: [phone] Please utilize Amion for appropriate phone number to reach the unit pharmacist [facility])    10/04/2017  8:26 AM

## 2017-10-04 NOTE — Progress Notes (Signed)
Pt gone to dialysis via bed.   

## 2017-10-04 NOTE — Progress Notes (Signed)
Pt continues to refuse CPAP. Will notify for RT if he decides he wants a machine.

## 2017-10-04 NOTE — Progress Notes (Signed)
PROGRESS NOTE    Perry [name]  MWN:[id number] DOB: 1970/05/29 DOA: 09/27/2017 PCP: [facility]  Brief Narrative:47 year old male with PMH notable for ESRD on HD via tunneled catheter. This was unfortunately complicated by bacteremia in April of this year. This was treated with catheter exchange and prolonged course of vancomycin. TEE was negative, but course was complicated by CVA during that admission, from which he now has residual L sided hemiparesis. He is reportedly bedbound and requires fully assisted care. He then presented to [facility] 9/11 with complaints of fevers and SOB. He was admitted and started on ABX to rule out PNA, however, blood cultures grew MRSA. He was narrowed to vancomycin. HD catheter was removed. He had TEE done 9/11 demonstrating large mitral vegetation with septal perforation tracking into the aortic valve, suspected abscess. 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 He was transferred to [facility] for CVTS evaluation.   Assessment & Plan:   Principal Problem:   Acute bacterial endocarditis Active Problems:   ESRD (end stage renal disease) on dialysis (HCC)   Goals of care, counseling/discussion   MRSA bacteremia   H/O: stroke  Bacteremia:secondary to MRSA mitral valve vegetation, which has likely grown into abscess. Now complicated by septal perforation.NOT surgical candidate per CVTS -  Patient is continued on IV vancomycin, anticipated at least 6 week total duration - ID had been following, recommendation for 8 weeks of vanc via HD and follow up with ID in [place] in 6-8 weeks. Decision to be made later if patient needs chronic suppression therapy afterwards if not operable candidate - CT surgery had seen in consultation, not surgical candidate at this time - Repeat blood cx neg noted - afebrile. No leukocytosis - Pt is s/p L IJ temp dialysis cath 9/14 - Chart reviewed. Concerns for poorly functioning HD cath which may ned to be changed or  repositioned. IR was consulted with plans for possible cath exchange.  Chronic hypotension - BP reviewed and is stable at present, however noted to have profound hypotension on HD, preventing patient from tolerating full session - Will continue on midodrine as tolerated 15 mg tid  OSA on CPAP - Will continue onCPAP as patient tolerates  ESRD on HD (MWF) - Nephrology following. - Concerns for possible malfunctioning HD cath. IR was consulted as per above, plans for possible cath exchange  History of CVAApril 2019: residual L sided hemiparesis. Functional quadriplegia.  - Continue with supportive care - conversant. Appears stable - Held coumadin per concerns of possible HD cath needing repositioning per Nephrology -Patient is continued on heparin gtt  Anticoagulation chronic warfarin: INR initially therapeutic, now subtherapeutic.  -INR was reversed for line placement -Had been on coumadin for history of CVA, currently on heparin gtt per above -When able to, would resume coumadin   DVT prophylaxis: heparin gtt Code Status: DNR Family Communication: Pt in room, family not at bedside Disposition Plan: Uncertain at this time  Consultants:   CCM  Nephrology  Palliative Care  ID  CTS  IR Procedures:   TEE 9/11  Tunneled temp HD cath by IR 9/14 Antimicrobials:vanco ancef  Subjective: Eating in bed in no acute distress getting ready to go for dialysis denies any new complaints  Objective: Vitals:   10/04/17 1200 10/04/17 1215 10/04/17 1230 10/04/17 1300  BP: (!) 86/33 (!) 86/32 (!) 98/32 (!) 106/25  Pulse: 79 81 83 83  Resp:      Temp:      TempSrc:  SpO2:      Weight:      Height:        Intake/Output Summary (Last 24 hours) at 10/04/2017 1324 Last data filed at 10/04/2017 0900 Gross per 24 hour  Intake 230 ml  Output -  Net 230 ml   Filed Weights   09/30/17 2105 10/02/17 0427 10/04/17 0930  Weight: 90.6 kg 90.6 kg 81.8 kg     Examination:  General exam: Appears calm and comfortable  Respiratory system: Clear to auscultation. Respiratory effort normal. Cardiovascular system: S1 & S2 heard, RRR. No JVD, murmurs, rubs, gallops or clicks. No pedal edema. Gastrointestinal system: Abdomen is nondistended, soft and nontender. No organomegaly or masses felt. Normal bowel sounds heard. Central nervous system: Alert and oriented. No focal neurological deficits. Extremities: Symmetric 5 x 5 power. Skin: No rashes, lesions or ulcers Psychiatry: Judgement and insight appear normal. Mood & affect appropriate.     Data Reviewed: I have personally reviewed following labs and imaging studies  CBC: Recent Labs  Lab 09/29/17 0358 09/30/17 2130 10/02/17 0806 10/03/17 0612 10/04/17 0555  WBC 6.5 8.2 8.0 8.1 8.7  HGB 12.7* 11.3* 12.1* 11.5* 11.1*  HCT 42.9 38.8* 41.9 39.7 37.5*  MCV 86.7 89.4 90.1 89.4 89.3  PLT 508* 570* 535* 440* 371   Basic Metabolic Panel: Recent Labs  Lab 09/28/17 0700 09/29/17 0358 09/30/17 2130 10/01/17 1121 10/02/17 0806 10/04/17 1100  NA 139 139 139 139 140 138  K 3.8 4.2 3.4* 3.1* 3.3* 3.4*  CL 100 99 96* 99 100 97*  CO2 23 23 23 23 26 24   GLUCOSE 85 78 108* 140* 95 92  BUN 38* 45* 56* 28* 35* 39*  CREATININE 7.23* 8.67* 10.90* 7.46* 8.57* 9.77*  CALCIUM 8.6* 8.5* 8.3* 8.4* 8.3* 7.9*  MG 2.3  --   --   --   --   --   PHOS 2.9 3.4 3.8 2.9 3.9 4.2   GFR: Estimated Creatinine Clearance: 9.5 mL/min (A) (by C-G formula based on SCr of 9.77 mg/dL (H)). Liver Function Tests: Recent Labs  Lab 09/29/17 0358 09/30/17 2130 10/01/17 1121 10/02/17 0806 10/04/17 1100  ALBUMIN 2.5* 2.6* 2.6* 2.6* 2.7*   No results for input(s): LIPASE, AMYLASE in the last 168 hours. No results for input(s): AMMONIA in the last 168 hours. Coagulation Profile: Recent Labs  Lab 09/29/17 1355 09/30/17 0253 10/02/17 0434 10/03/17 0612 10/04/17 0555  INR 2.39 1.40 1.13 1.16 1.29   Cardiac  Enzymes: No results for input(s): CKTOTAL, CKMB, CKMBINDEX, TROPONINI in the last 168 hours. BNP (last 3 results) No results for input(s): PROBNP in the last 8760 hours. HbA1C: No results for input(s): HGBA1C in the last 72 hours. CBG: No results for input(s): GLUCAP in the last 168 hours. Lipid Profile: No results for input(s): CHOL, HDL, LDLCALC, TRIG, CHOLHDL, LDLDIRECT in the last 72 hours. Thyroid Function Tests: No results for input(s): TSH, T4TOTAL, FREET4, T3FREE, THYROIDAB in the last 72 hours. Anemia Panel: No results for input(s): VITAMINB12, FOLATE, FERRITIN, TIBC, IRON, RETICCTPCT in the last 72 hours. Sepsis Labs: No results for input(s): PROCALCITON, LATICACIDVEN in the last 168 hours.  Recent Results (from the past 240 hour(s))  Cath Tip Culture     Status: Abnormal   Collection Time: 09/27/17  9:07 AM  Result Value Ref Range Status   Specimen Description CATH TIP  Final   Special Requests   Final    NONE Performed at [facility], [address], [place], [place] [address]    Culture (A)  Final    800 COLONIES/mL METHICILLIN RESISTANT STAPHYLOCOCCUS AUREUS   Report Status 09/29/2017 FINAL  Final   Organism ID, Bacteria METHICILLIN RESISTANT STAPHYLOCOCCUS AUREUS (A)  Final      Susceptibility   Methicillin resistant staphylococcus aureus - MIC*    CIPROFLOXACIN >=8 RESISTANT Resistant     ERYTHROMYCIN >=8 RESISTANT Resistant     GENTAMICIN <=0.5 SENSITIVE Sensitive     OXACILLIN >=4 RESISTANT Resistant     TETRACYCLINE <=1 SENSITIVE Sensitive     VANCOMYCIN <=0.5 SENSITIVE Sensitive     TRIMETH/SULFA <=10 SENSITIVE Sensitive     CLINDAMYCIN <=0.25 SENSITIVE Sensitive     RIFAMPIN <=0.5 SENSITIVE Sensitive     Inducible Clindamycin NEGATIVE Sensitive     * 800 COLONIES/mL METHICILLIN RESISTANT STAPHYLOCOCCUS AUREUS  Culture, blood (Routine X 2) w Reflex to ID Panel     Status: None   Collection Time: 09/28/17  7:00 AM  Result Value Ref Range Status    Specimen Description BLOOD RIGHT ARM  Final   Special Requests   Final    BOTTLES DRAWN AEROBIC ONLY Blood Culture results may not be optimal due to an inadequate volume of blood received in culture bottles   Culture   Final    NO GROWTH 5 DAYS Performed at [facility], [address], [place], [place] [address]    Report Status 10/03/2017 FINAL  Final  Culture, blood (Routine X 2) w Reflex to ID Panel     Status: None   Collection Time: 09/28/17  7:10 AM  Result Value Ref Range Status   Specimen Description BLOOD RIGHT HAND  Final   Special Requests   Final    BOTTLES DRAWN AEROBIC ONLY Blood Culture results may not be optimal due to an inadequate volume of blood received in culture bottles   Culture   Final    NO GROWTH 5 DAYS Performed at [facility], [address], [place], [place] [address]    Report Status 10/03/2017 FINAL  Final         Radiology Studies: Ir Fluoro Guide Cv Line Left  Result Date: 10/03/2017 INDICATION: 47 year old male with a history of renal failure. Recently placed left IJ central venous catheter has had difficulty aspirating. EXAM: IMAGE GUIDED EXCHANGE OF LEFT IJ TUNNELED HEMODIALYSIS CATHETER MEDICATIONS: 2.0 g Ancef. The antibiotic was given in an appropriate time interval prior to skin puncture. ANESTHESIA/SEDATION: No moderate sedation FLUOROSCOPY TIME:  Fluoroscopy Time: 0 minutes 12 seconds (11 mGy). COMPLICATIONS: None PROCEDURE: Informed written consent was obtained from the patient after a discussion of the risks, benefits, and alternatives to treatment. Questions regarding the procedure were encouraged and answered. The left neck and chest and the indwelling catheter were prepped with chlorhexidine in a sterile fashion, and a sterile drape was applied covering the operative field. Maximum barrier sterile technique with sterile gowns and gloves were used for the procedure. A timeout was performed prior to the initiation of the  procedure. Initial images were recorded. The blue and brown hubs of the catheter were aspirated vigorously. There was some sputtering at the blue hub of the catheter. Contrast was injected gently through the catheter, which suggested possibility of some narrowing at the cavoatrial junction. Modified Seldinger technique was then used to exchange the new 23 cm tip to cuff catheter over a stiff Glidewire, with slight further advancement of the catheter into the upper right atrium. The  catheter was then aspirated vigorously confirming excellent flow at both brown and blue ports. Hep-Lock was infused. The catheter exit site was secured with a 0-Prolene retention suture. Dressings were applied. The patient tolerated the procedure well without immediate post procedural complication. IMPRESSION: Status post exchange of left IJ tunneled hemodialysis catheter with placement of a new 23 cm tip to cuff catheter. Signed, Yvone NeuJaime S. Reyne DumasWagner, DO, RPVI [facility] Electronically Signed   By: Gilmer MorJaime  Wagner D.O.   On: 10/03/2017 17:13        Scheduled Meds: . aspirin  81 mg Oral Daily  . Chlorhexidine Gluconate Cloth  6 each Topical Q0600  . cinacalcet  60 mg Oral QHS  . escitalopram  20 mg Oral QHS  . midodrine  15 mg Oral TID  . multivitamin  1 tablet Oral Daily  . multivitamin  1 tablet Oral QHS  . pantoprazole  20 mg Oral Daily  . vancomycin variable dose per unstable renal function (pharmacist dosing)   Does not apply See admin instructions  . vitamin A  10,000 Units Oral Daily  . ascorbic acid  250 mg Oral BID  . Vitamin D (Ergocalciferol)  50,000 Units Oral Q7 days  . warfarin  3 mg Oral ONCE-1800  . Warfarin - Pharmacist Dosing Inpatient   Does not apply q1800   Continuous Infusions: . heparin 1,800 Units/hr (10/04/17 0014)     LOS: 7 days     Alwyn RenElizabeth G Leanore Biggers, MD [facility]  If 7PM-7AM, please contact  night-coverage [email or web address] Password [email or web address] 10/04/2017, 1:24 PM

## 2017-10-05 LAB — CBC
HEMATOCRIT: 38.3 % — AB (ref 39.0–52.0)
Hemoglobin: 11.3 g/dL — ABNORMAL LOW (ref 13.0–17.0)
MCH: 26.6 pg (ref 26.0–34.0)
MCHC: 29.5 g/dL — ABNORMAL LOW (ref 30.0–36.0)
MCV: 90.1 fL (ref 78.0–100.0)
Platelets: 388 10*3/uL (ref 150–400)
RBC: 4.25 MIL/uL (ref 4.22–5.81)
RDW: 25.1 % — ABNORMAL HIGH (ref 11.5–15.5)
WBC: 8.7 10*3/uL (ref 4.0–10.5)

## 2017-10-05 LAB — PROTIME-INR
INR: 1.44
Prothrombin Time: 17.4 seconds — ABNORMAL HIGH (ref 11.4–15.2)

## 2017-10-05 LAB — HEPARIN LEVEL (UNFRACTIONATED)
Heparin Unfractionated: 2.14 IU/mL — ABNORMAL HIGH (ref 0.30–0.70)
Heparin Unfractionated: 1.48 IU/mL — ABNORMAL HIGH (ref 0.30–0.70)
Heparin Unfractionated: 0.59 IU/mL (ref 0.30–0.70)

## 2017-10-05 MED ORDER — HEPARIN (PORCINE) IN NACL 100-0.45 UNIT/ML-% IJ SOLN
1300.0000 [IU]/h | INTRAMUSCULAR | Status: DC
  Administered 2017-10-05: 1300 [IU]/h via INTRAVENOUS
  Filled 2017-10-05: qty 250

## 2017-10-05 MED ORDER — WARFARIN SODIUM 5 MG PO TABS
5.0000 mg | ORAL_TABLET | Freq: Once | ORAL | Status: AC
  Administered 2017-10-05: 5 mg via ORAL
  Filled 2017-10-05: qty 1

## 2017-10-05 MED ORDER — HEPARIN (PORCINE) IN NACL 100-0.45 UNIT/ML-% IJ SOLN
1300.0000 [IU]/h | INTRAMUSCULAR | Status: DC
  Administered 2017-10-05 – 2017-10-07 (×3): 1050 [IU]/h via INTRAVENOUS
  Administered 2017-10-08 – 2017-10-09 (×2): 1300 [IU]/h via INTRAVENOUS
  Filled 2017-10-05 (×5): qty 250

## 2017-10-05 MED ORDER — VITAMIN A 10000 UNITS PO CAPS
10000.0000 [IU] | ORAL_CAPSULE | Freq: Every day | ORAL | Status: DC
  Administered 2017-10-05 – 2017-10-09 (×5): 10000 [IU] via ORAL
  Filled 2017-10-05 (×5): qty 1

## 2017-10-05 NOTE — Progress Notes (Signed)
ANTICOAGULATION CONSULT NOTE - Follow Up  Pharmacy Consult for Heparin Indication: Hx of CVA and DVT   Patient Measurements: Height: 5\' 6"  (167.6 cm) Weight: 172 lb 13.5 oz (78.4 kg) IBW/kg (Calculated) : 63.8  Heparin Dosing Wt: 74 kg  Vital Signs: Temp: 98.1 F (36.7 C) (09/19 2109) Temp Source: Oral (09/19 2109) BP: 109/35 (09/19 2109) Pulse Rate: 85 (09/19 2109)  Labs: Recent Labs    10/03/17 0612  10/04/17 0555 10/04/17 1100  10/05/17 0416 10/05/17 1230 10/05/17 2207  HGB 11.5*  --  11.1*  --   --  11.3*  --   --   HCT 39.7  --  37.5*  --   --  38.3*  --   --   PLT 440*  --  371  --   --  388  --   --   LABPROT 14.7  --  16.0*  --   --  17.4*  --   --   INR 1.16  --  1.29  --   --  1.44  --   --   HEPARINUNFRC <0.10*   < > 0.60  --    < > 2.14* 1.48* 0.59  CREATININE  --   --   --  9.77*  --   --   --   --    < > = values in this interval not displayed.    Estimated Creatinine Clearance: 9.3 mL/min (A) (by C-G formula based on SCr of 9.77 mg/dL (H)).   Medications:  Scheduled:  . aspirin  81 mg Oral Daily  . Chlorhexidine Gluconate Cloth  6 each Topical Q0600  . cinacalcet  60 mg Oral QHS  . escitalopram  20 mg Oral QHS  . midodrine  15 mg Oral TID  . multivitamin  1 tablet Oral Daily  . multivitamin  1 tablet Oral QHS  . pantoprazole  20 mg Oral Daily  . vancomycin variable dose per unstable renal function (pharmacist dosing)   Does not apply See admin instructions  . vitamin A  10,000 Units Oral Daily  . ascorbic acid  250 mg Oral BID  . Vitamin D (Ergocalciferol)  50,000 Units Oral Q7 days  . 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 Now complicated by septal perforation.NOT surgical candidate per CVTS.  Heparin level decreased to 0.59 units/ml after holding Heparin for 1 hour and reducing to  1050 units/hr. No bleeding noted. Hgb stable.   Goal of Therapy:  INR 2-3  Heparin level 0.3-0.7 Monitor platelets by anticoagulation protocol: Yes   Plan:  Continue heparin at 1050 units/hr.  Daily Heparin level and CBC.   Perry [name], Perry [name], Perry [name], Perry [name] Clinical Pharmacist Clinical phone 10/05/2017 until 11:[phone]- #[phone] Please refer to [facility] for [facility] numbers 10/05/2017, 10:36 PM

## 2017-10-05 NOTE — Progress Notes (Signed)
Patient refuses the use of CPAP 

## 2017-10-05 NOTE — Progress Notes (Signed)
ANTICOAGULATION CONSULT NOTE - Follow Up  Pharmacy Consult for Heparin and warfarin Indication: Hx of CVA and DVT   Patient Measurements: Height: 5\' 6"  (167.6 cm) Weight: 172 lb 13.5 oz (78.4 kg) IBW/kg (Calculated) : 63.8  Heparin Dosing Wt: 74 kg  Vital Signs: Temp: 97.5 F (36.4 C) (09/19 0445) Temp Source: Oral (09/19 0445) BP: 102/29 (09/19 0445) Pulse Rate: 83 (09/19 0445)  Labs: Recent Labs    10/02/17 0806  10/03/17 0612  10/04/17 0555 10/04/17 1100 10/04/17 1537 10/05/17 0416  HGB 12.1*  --  11.5*  --  11.1*  --   --  11.3*  HCT 41.9  --  39.7  --  37.5*  --   --  38.3*  PLT 535*  --  440*  --  371  --   --  388  LABPROT  --   --  14.7  --  16.0*  --   --  17.4*  INR  --   --  1.16  --  1.29  --   --  1.44  HEPARINUNFRC  --    < > <0.10*   < > 0.60  --  0.89* 2.14*  CREATININE 8.57*  --   --   --   --  9.77*  --   --    < > = values in this interval not displayed.    Estimated Creatinine Clearance: 9.3 mL/min (A) (by C-G formula based on SCr of 9.77 mg/dL (H)).   Medications:  Scheduled:  . aspirin  81 mg Oral Daily  . Chlorhexidine Gluconate Cloth  6 each Topical Q0600  . cinacalcet  60 mg Oral QHS  . escitalopram  20 mg Oral QHS  . midodrine  15 mg Oral TID  . multivitamin  1 tablet Oral Daily  . multivitamin  1 tablet Oral QHS  . pantoprazole  20 mg Oral Daily  . vancomycin variable dose per unstable renal function (pharmacist dosing)   Does not apply See admin instructions  . vitamin A  10,000 Units Oral Daily  . ascorbic acid  250 mg Oral BID  . Vitamin D (Ergocalciferol)  50,000 Units Oral Q7 days  . 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 Now complicated by septal perforation.NOT surgical candidate per CVTS.  Warfarin held for procedure. Pt is s/p IR procedure for replacement of  left IJ tunneled HD cath.  He may still have some issue with HD access and may require repositioning or exchange.   Heparin level this am 2.14 units/ml  Goal of Therapy:  INR 2-3  Heparin level 0.3-0.7 Monitor platelets by anticoagulation protocol: Yes   Plan:  - Hold heparin for 1 hour.  Restart heparin at 1300 units/hr. Check heparin level 6 hours after restart  Thanks for allowing pharmacy to be a part of this patient's care.  Talbert CageLora Wilman Tucker, PharmD Clinical Pharmacist

## 2017-10-05 NOTE — Progress Notes (Signed)
PROGRESS NOTE    Perry [name]  KGM:[id number] DOB: 02-May-1970 DOA: 09/27/2017 PCP: [facility]     Brief Narrative:   47 year old male with PMH notable for ESRD on HD via tunneled catheter. This was unfortunately complicated by bacteremia in April of this year. This was treated with catheter exchange and prolonged course of vancomycin. TEE was negative, but course was complicated by CVA during that admission, from which he now has residual L sided hemiparesis. He is reportedly bedbound and requires fully assisted care. He then presented to [facility] 9/11 with complaints of fevers and SOB. He was admitted and started on ABX to rule out PNA, however, blood cultures grew MRSA. He was narrowed to vancomycin. HD catheter was removed. He had TEE done 9/11 demonstrating large mitral vegetation with septal perforation tracking into the aortic valve, suspected abscess. 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 He was transferred to [facility] for CVTS evaluation.    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  Plan for 6 weeks of IV vancomycin for endocarditis, via HD, recommend to follow up with ID in [place] in 6 to 8 weeks.  Repeat blood cultures neg till date. He is afebrile and no leukocytosis.      ESRD on HD MWF Further management as per nephrology.  IR replaced his left IJ tunneled HD catheter on 9/17. Awaiting outpatient HD to be set up , now that he is able to tolerate HD.    S/o stroke: with residual left sided hemiparesis.  Continue with supportive care.  On coumadin, on heparin for bridging.  sub therapeutic INR. Pt currently on  heparin till INR is therapeutic for discharge.    OSA ON CPAP.  Mild anemia of chronic disease:  Hemoglobin stable at 11. No need for epogen.    Hypotension:  Resume midodrine as tolerated.   In view of his multiple medical problems, palliative care consulted and recommendations noted.    DVT prophylaxis: heparin and coumadin. Code Status: DNR Family Communication: none at bedside.  Disposition Plan: awaiting INR to be therapeutic prior to discharge and also new HD to be set up prior to d/c  Consultants:   Nephrology  palliative care   IR  CVTS.  Procedures: Left IJ tunneled catheter placement.   Antimicrobials:IV vancomycin for a total of 6 weeks.   Subjective: Denies any chest pain or sob. No  Nausea or vomiting.   Objective: Vitals:   10/04/17 1436 10/04/17 2126 10/05/17 0445 10/05/17 0905  BP: (!) 105/31 (!) 80/51 (!) 102/29 (!) 108/32  Pulse: 79 86 83 82  Resp: 18 16 17 18   Temp: 97.7 F (36.5 C) 98.3 F (36.8 C) (!) 97.5 F (36.4 C) 98.1 F (36.7 C)  TempSrc: Oral Oral Oral Oral  SpO2: 100% 97% 98% 99%  Weight:  78.4 kg    Height:        Intake/Output Summary (Last 24 hours) at 10/05/2017 0919 Last data filed at 10/05/2017 0616 Gross per 24 hour  Intake 1113.91 ml  Output 2000 ml  Net -886.09 ml   Filed Weights   10/04/17 0930 10/04/17 1352 10/04/17 2126  Weight: 81.8 kg 78.4 kg 78.4 kg    Examination:  General exam: Appears calm and comfortable  Respiratory system: diminished at bases, no wheezing heard.  Cardiovascular system: S1 & S2 heard, RRR. No JVD,  Gastrointestinal system: Abdomen is soft non tender non distended bowel sounds good.  Central nervous system: Alert and oriented. No focal neurological deficits. Extremities: Symmetric 5 x 5 power. Skin: No rashes, lesions or ulcers Psychiatry: . Mood & affect appropriate.     Data Reviewed: I have personally reviewed following labs and imaging studies  CBC: Recent Labs  Lab  09/30/17 2130 10/02/17 0806 10/03/17 0612 10/04/17 0555 10/05/17 0416  WBC 8.2 8.0 8.1 8.7 8.7  HGB 11.3* 12.1* 11.5* 11.1* 11.3*  HCT 38.8* 41.9 39.7 37.5* 38.3*  MCV 89.4 90.1 89.4 89.3 90.1  PLT 570* 535* 440* 371 388   Basic Metabolic Panel: Recent Labs  Lab 09/29/17 0358 09/30/17 2130 10/01/17 1121 10/02/17 0806 10/04/17 1100  NA 139 139 139 140 138  K 4.2 3.4* 3.1* 3.3* 3.4*  CL 99 96* 99 100 97*  CO2 23 23 23 26 24   GLUCOSE 78 108* 140* 95 92  BUN 45* 56* 28* 35* 39*  CREATININE 8.67* 10.90* 7.46* 8.57* 9.77*  CALCIUM 8.5* 8.3* 8.4* 8.3* 7.9*  PHOS 3.4 3.8 2.9 3.9 4.2   GFR: Estimated Creatinine Clearance: 9.3 mL/min (A) (by C-G formula based on SCr of 9.77 mg/dL (H)). Liver Function Tests: Recent Labs  Lab 09/29/17 0358 09/30/17 2130 10/01/17 1121 10/02/17 0806 10/04/17 1100  ALBUMIN 2.5* 2.6* 2.6* 2.6* 2.7*   No results for input(s): LIPASE, AMYLASE in the last 168 hours. No results for input(s): AMMONIA in the last 168 hours. Coagulation Profile: Recent Labs  Lab 09/30/17 0253 10/02/17 0434 10/03/17 0612 10/04/17 0555 10/05/17 0416  INR 1.40 1.13 1.16 1.29 1.44   Cardiac Enzymes: No results for input(s): CKTOTAL, CKMB, CKMBINDEX, TROPONINI in the last 168 hours. BNP (last 3 results) No results for input(s): PROBNP in the last 8760 hours. HbA1C: No results for input(s): HGBA1C in the last 72 hours. CBG: No results for input(s): GLUCAP in the last 168 hours. Lipid Profile: No results for input(s): CHOL, HDL, LDLCALC, TRIG, CHOLHDL, LDLDIRECT in the last 72 hours. Thyroid Function Tests: No results for input(s): TSH, T4TOTAL, FREET4, T3FREE, THYROIDAB in the last 72 hours. Anemia Panel: No results for input(s): VITAMINB12, FOLATE, FERRITIN, TIBC, IRON, RETICCTPCT in the last 72 hours. Sepsis Labs: No results for input(s): PROCALCITON, LATICACIDVEN in the last 168 hours.  Recent Results (from the past 240 hour(s))  Cath Tip Culture      Status: Abnormal   Collection Time: 09/27/17  9:07 AM  Result Value Ref Range Status   Specimen Description CATH TIP  Final   Special Requests   Final    NONE Performed at [facility], [address] [place], [place] [address]    Culture (A)  Final    800 COLONIES/mL METHICILLIN RESISTANT STAPHYLOCOCCUS AUREUS   Report Status 09/29/2017 FINAL  Final   Organism ID, Bacteria METHICILLIN RESISTANT STAPHYLOCOCCUS AUREUS (A)  Final      Susceptibility   Methicillin resistant staphylococcus aureus - MIC*    CIPROFLOXACIN >=8 RESISTANT Resistant     ERYTHROMYCIN >=8 RESISTANT Resistant     GENTAMICIN <=0.5 SENSITIVE Sensitive     OXACILLIN >=4 RESISTANT Resistant     TETRACYCLINE <=1 SENSITIVE Sensitive     VANCOMYCIN <=0.5 SENSITIVE Sensitive  TRIMETH/SULFA <=10 SENSITIVE Sensitive     CLINDAMYCIN <=0.25 SENSITIVE Sensitive     RIFAMPIN <=0.5 SENSITIVE Sensitive     Inducible Clindamycin NEGATIVE Sensitive     * 800 COLONIES/mL METHICILLIN RESISTANT STAPHYLOCOCCUS AUREUS  Culture, blood (Routine X 2) w Reflex to ID Panel     Status: None   Collection Time: 09/28/17  7:00 AM  Result Value Ref Range Status   Specimen Description BLOOD RIGHT ARM  Final   Special Requests   Final    BOTTLES DRAWN AEROBIC ONLY Blood Culture results may not be optimal due to an inadequate volume of blood received in culture bottles   Culture   Final    NO GROWTH 5 DAYS Performed at [facility], [address] [place], [place] [address]    Report Status 10/03/2017 FINAL  Final  Culture, blood (Routine X 2) w Reflex to ID Panel     Status: None   Collection Time: 09/28/17  7:10 AM  Result Value Ref Range Status   Specimen Description BLOOD RIGHT HAND  Final   Special Requests   Final    BOTTLES DRAWN AEROBIC ONLY Blood Culture results may not be optimal due to an inadequate volume of blood received in culture bottles   Culture   Final    NO GROWTH 5 DAYS Performed at [facility], [address], [place], [place] [address]    Report Status 10/03/2017 FINAL  Final         Radiology Studies: Ir Fluoro Guide Cv Line Left  Result Date: 10/03/2017 INDICATION: 46 year old male with a history of renal failure. Recently placed left IJ central venous catheter has had difficulty aspirating. EXAM: IMAGE GUIDED EXCHANGE OF LEFT IJ TUNNELED HEMODIALYSIS CATHETER MEDICATIONS: 2.0 g Ancef. The antibiotic was given in an appropriate time interval prior to skin puncture. ANESTHESIA/SEDATION: No moderate sedation FLUOROSCOPY TIME:  Fluoroscopy Time: 0 minutes 12 seconds (11 mGy). COMPLICATIONS: None PROCEDURE: Informed written consent was obtained from the patient after a discussion of the risks, benefits, and alternatives to treatment. Questions regarding the procedure were encouraged and answered. The left neck and chest and the indwelling catheter were prepped with chlorhexidine in a sterile fashion, and a sterile drape was applied covering the operative field. Maximum barrier sterile technique with sterile gowns and gloves were used for the procedure. A timeout was performed prior to the initiation of the procedure. Initial images were recorded. The blue and brown hubs of the catheter were aspirated vigorously. There was some sputtering at the blue hub of the catheter. Contrast was injected gently through the catheter, which suggested possibility of some narrowing at the cavoatrial junction. Modified Seldinger technique was then used to exchange the new 23 cm tip to cuff catheter over a stiff Glidewire, with slight further advancement of the catheter into the upper right atrium. The catheter was then aspirated vigorously confirming excellent flow at both brown and blue ports. Hep-Lock was infused. The catheter exit site was secured with a 0-Prolene retention suture. Dressings were applied. The patient tolerated the procedure well without immediate post procedural complication.  IMPRESSION: Status post exchange of left IJ tunneled hemodialysis catheter with placement of a new 23 cm tip to cuff catheter. Signed, Yvone Neu. Reyne Dumas, RPVI [facility] Radiology Electronically Signed   By: Gilmer Mor D.O.   On: 10/03/2017 17:13        Scheduled Meds: . aspirin  81 mg Oral  Daily  . Chlorhexidine Gluconate Cloth  6 each Topical Q0600  . cinacalcet  60 mg Oral QHS  . escitalopram  20 mg Oral QHS  . midodrine  15 mg Oral TID  . multivitamin  1 tablet Oral Daily  . multivitamin  1 tablet Oral QHS  . pantoprazole  20 mg Oral Daily  . vancomycin variable dose per unstable renal function (pharmacist dosing)   Does not apply See admin instructions  . vitamin A  10,000 Units Oral Daily  . ascorbic acid  250 mg Oral BID  . Vitamin D (Ergocalciferol)  50,000 Units Oral Q7 days  . Warfarin - Pharmacist Dosing Inpatient   Does not apply q1800   Continuous Infusions: . heparin 1,300 Units/hr (10/05/17 0658)     LOS: 8 days    Time spent: 34 min    Kathlen Mody, MD [facility] Pager [phone]  If 7PM-7AM, please contact night-coverage [email or web address] Password [facility] 10/05/2017, 9:19 AM

## 2017-10-05 NOTE — Progress Notes (Signed)
ANTICOAGULATION CONSULT NOTE - Follow Up  Pharmacy Consult for Heparin and warfarin Indication: Hx of CVA and DVT   Patient Measurements: Height: 5\' 6"  (167.6 cm) Weight: 172 lb 13.5 oz (78.4 kg) IBW/kg (Calculated) : 63.8  Heparin Dosing Wt: 74 kg  Vital Signs: Temp: 98.1 F (36.7 C) (09/19 0905) Temp Source: Oral (09/19 0905) BP: 108/32 (09/19 0905) Pulse Rate: 82 (09/19 0905)  Labs: Recent Labs    10/03/17 0612  10/04/17 0555 10/04/17 1100 10/04/17 1537 10/05/17 0416 10/05/17 1230  HGB 11.5*  --  11.1*  --   --  11.3*  --   HCT 39.7  --  37.5*  --   --  38.3*  --   PLT 440*  --  371  --   --  388  --   LABPROT 14.7  --  16.0*  --   --  17.4*  --   INR 1.16  --  1.29  --   --  1.44  --   HEPARINUNFRC <0.10*   < > 0.60  --  0.89* 2.14* 1.48*  CREATININE  --   --   --  9.77*  --   --   --    < > = values in this interval not displayed.    Estimated Creatinine Clearance: 9.3 mL/min (A) (by C-G formula based on SCr of 9.77 mg/dL (H)).   Medications:  Scheduled:  . aspirin  81 mg Oral Daily  . Chlorhexidine Gluconate Cloth  6 each Topical Q0600  . cinacalcet  60 mg Oral QHS  . escitalopram  20 mg Oral QHS  . midodrine  15 mg Oral TID  . multivitamin  1 tablet Oral Daily  . multivitamin  1 tablet Oral QHS  . pantoprazole  20 mg Oral Daily  . vancomycin variable dose per unstable renal function (pharmacist dosing)   Does not apply See admin instructions  . vitamin A  10,000 Units Oral Daily  . ascorbic acid  250 mg Oral BID  . Vitamin D (Ergocalciferol)  50,000 Units Oral Q7 days  . 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 Now complicated by septal perforation.NOT surgical candidate per CVTS.  Heparin level this afternoon is 1.48 units/ml. Lab drawn from opposite arm per lab. INR 1.44. No bleeding noted.  Hgb stable.   Goal of Therapy:  INR 2-3  Heparin level 0.3-0.7 Monitor platelets by anticoagulation protocol: Yes   Plan:  Hold heparin for 1 hour.  Restart heparin at 1050 units/hr. Check heparin level 6 hours after restart Warfarin 5mg  x 1 tonight Daily INR  Gene Colee A. Jeanella CrazePierce, PharmD, BCPS Clinical Pharmacist [place] Pager: [phone] Please utilize Amion for appropriate phone number to reach the unit pharmacist [facility])

## 2017-10-05 NOTE — Progress Notes (Signed)
S: no new complaints O:BP (!) 108/32 (BP Location: Right Arm)   Pulse 82   Temp 98.1 F (36.7 C) (Oral)   Resp 18   Ht 5\' 6"  (1.676 m)   Wt 78.4 kg   SpO2 99%   BMI 27.90 kg/m   Intake/Output Summary (Last 24 hours) at 10/05/2017 1238 Last data filed at 10/05/2017 0900 Gross per 24 hour  Intake 1233.91 ml  Output 2000 ml  Net -766.09 ml   Intake/Output: I/O last 3 completed shifts: In: 1233.9 [P.O.:540; I.V.:693.9] Out: 2000 [Other:2000]  Intake/Output this shift:  Total I/O In: 120 [P.O.:120] Out: -  Weight change:  Gen: fatigued AAM in NAD CVS: no rub Resp: cta Abd: +BS, soft, NT Ext: trace edema  Recent Labs  Lab 09/29/17 0358 09/30/17 2130 10/01/17 1121 10/02/17 0806 10/04/17 1100  NA 139 139 139 140 138  K 4.2 3.4* 3.1* 3.3* 3.4*  CL 99 96* 99 100 97*  CO2 23 23 23 26 24   GLUCOSE 78 108* 140* 95 92  BUN 45* 56* 28* 35* 39*  CREATININE 8.67* 10.90* 7.46* 8.57* 9.77*  ALBUMIN 2.5* 2.6* 2.6* 2.6* 2.7*  CALCIUM 8.5* 8.3* 8.4* 8.3* 7.9*  PHOS 3.4 3.8 2.9 3.9 4.2   Liver Function Tests: Recent Labs  Lab 10/01/17 1121 10/02/17 0806 10/04/17 1100  ALBUMIN 2.6* 2.6* 2.7*   No results for input(s): LIPASE, AMYLASE in the last 168 hours. No results for input(s): AMMONIA in the last 168 hours. CBC: Recent Labs  Lab 09/30/17 2130 10/02/17 0806 10/03/17 0612 10/04/17 0555 10/05/17 0416  WBC 8.2 8.0 8.1 8.7 8.7  HGB 11.3* 12.1* 11.5* 11.1* 11.3*  HCT 38.8* 41.9 39.7 37.5* 38.3*  MCV 89.4 90.1 89.4 89.3 90.1  PLT 570* 535* 440* 371 388   Cardiac Enzymes: No results for input(s): CKTOTAL, CKMB, CKMBINDEX, TROPONINI in the last 168 hours. CBG: No results for input(s): GLUCAP in the last 168 hours.  Iron Studies: No results for input(s): IRON, TIBC, TRANSFERRIN, FERRITIN in the last 72 hours. Studies/Results: Ir Fluoro Guide Cv Line Left  Result Date: 10/03/2017 INDICATION: 47 year old male with a history of renal failure. Recently placed left IJ  central venous catheter has had difficulty aspirating. EXAM: IMAGE GUIDED EXCHANGE OF LEFT IJ TUNNELED HEMODIALYSIS CATHETER MEDICATIONS: 2.0 g Ancef. The antibiotic was given in an appropriate time interval prior to skin puncture. ANESTHESIA/SEDATION: No moderate sedation FLUOROSCOPY TIME:  Fluoroscopy Time: 0 minutes 12 seconds (11 mGy). COMPLICATIONS: None PROCEDURE: Informed written consent was obtained from the patient after a discussion of the risks, benefits, and alternatives to treatment. Questions regarding the procedure were encouraged and answered. The left neck and chest and the indwelling catheter were prepped with chlorhexidine in a sterile fashion, and a sterile drape was applied covering the operative field. Maximum barrier sterile technique with sterile gowns and gloves were used for the procedure. A timeout was performed prior to the initiation of the procedure. Initial images were recorded. The blue and brown hubs of the catheter were aspirated vigorously. There was some sputtering at the blue hub of the catheter. Contrast was injected gently through the catheter, which suggested possibility of some narrowing at the cavoatrial junction. Modified Seldinger technique was then used to exchange the new 23 cm tip to cuff catheter over a stiff Glidewire, with slight further advancement of the catheter into the upper right atrium. The catheter was then aspirated vigorously confirming excellent flow at both brown and blue ports. Hep-Lock was infused. The  catheter exit site was secured with a 0-Prolene retention suture. Dressings were applied. The patient tolerated the procedure well without immediate post procedural complication. IMPRESSION: Status post exchange of left IJ tunneled hemodialysis catheter with placement of a new 23 cm tip to cuff catheter. Signed, Yvone NeuJaime S. Reyne DumasWagner, DO, RPVI [facility] Interventional Radiology Specialists [facility] Electronically Signed   By: Gilmer MorJaime  Wagner D.O.    On: 10/03/2017 17:13   . aspirin  81 mg Oral Daily  . Chlorhexidine Gluconate Cloth  6 each Topical Q0600  . cinacalcet  60 mg Oral QHS  . escitalopram  20 mg Oral QHS  . midodrine  15 mg Oral TID  . multivitamin  1 tablet Oral Daily  . multivitamin  1 tablet Oral QHS  . pantoprazole  20 mg Oral Daily  . vancomycin variable dose per unstable renal function (pharmacist dosing)   Does not apply See admin instructions  . vitamin A  10,000 Units Oral Daily  . ascorbic acid  250 mg Oral BID  . Vitamin D (Ergocalciferol)  50,000 Units Oral Q7 days  . Warfarin - Pharmacist Dosing Inpatient   Does not apply q1800    BMET    Component Value Date/Time   NA 138 10/04/2017 1100   NA 135 05/15/2014 0612   K 3.4 (L) 10/04/2017 1100   K 5.9 (H) 05/15/2014 0612   CL 97 (L) 10/04/2017 1100   CL 92 (L) 05/15/2014 0612   CO2 24 10/04/2017 1100   CO2 21 (L) 05/15/2014 0612   GLUCOSE 92 10/04/2017 1100   GLUCOSE 97 05/15/2014 0612   BUN 39 (H) 10/04/2017 1100   BUN 77 (H) 05/15/2014 0612   CREATININE 9.77 (H) 10/04/2017 1100   CREATININE 12.78 (HH) 07/03/2014 1458   CALCIUM 7.9 (L) 10/04/2017 1100   CALCIUM 8.6 (L) 05/15/2014 0612   GFRNONAA 6 (L) 10/04/2017 1100   GFRNONAA 4 (L) 07/03/2014 1458   GFRAA 7 (L) 10/04/2017 1100   GFRAA 5 (L) 07/03/2014 1458   CBC    Component Value Date/Time   WBC 8.7 10/05/2017 0416   RBC 4.25 10/05/2017 0416   HGB 11.3 (L) 10/05/2017 0416   HGB 13.7 05/15/2014 0612   HCT 38.3 (L) 10/05/2017 0416   HCT 43.6 05/15/2014 0612   PLT 388 10/05/2017 0416   PLT 461 (H) 05/15/2014 0612   MCV 90.1 10/05/2017 0416   MCV 95 05/15/2014 0612   MCH 26.6 10/05/2017 0416   MCHC 29.5 (L) 10/05/2017 0416   RDW 25.1 (H) 10/05/2017 0416   RDW 19.0 (H) 05/15/2014 0612   LYMPHSABS 0.2 (L) 09/21/2017 1754   LYMPHSABS 0.3 (L) 05/15/2014 0612   MONOABS 0.5 09/21/2017 1754   MONOABS 1.7 (H) 05/15/2014 0612   EOSABS 0.0 09/21/2017 1754   EOSABS 0.0 05/15/2014 0612    BASOSABS 0.0 09/21/2017 1754   BASOSABS 0.0 05/15/2014 0612     Assessment/ Plan:  1. 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 TDC removed 09/27/17 new HD LIJ tdc placed 09/30/17. 2. 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 8 weekof abx and palliative care consulted.I did speak with Mr. Anderson MaltaRoyster that if he is not able to tolerate IHD (as evidence of tachycardia and hypotension) that we would need to transition to comfort care as he is not a surgical candidate for repair. He understands and has been seen by Palliative care already.  3. ESRD- cont with HD qMWF for now 4.  Hypotension- chronic and asymptomatic for now. Continue with midodrine 5. Vascular access- poorly functioning HD cath and may need to be changed/repositioned. 6. Disposition- poor prognosisand if he cannot tolerate HD tomorrow will need to transition to comfort care 7. H/o CVA and left hemiparesis. 8. Vascular access- had poor bfr with LIJ TDC and appreciate IR's assistance with exchange 10/03/17.  New LIJ tdc functioning much better.  Irena Cords, MD [employer] [phone]

## 2017-10-06 LAB — RENAL FUNCTION PANEL
ALBUMIN: 3.1 g/dL — AB (ref 3.5–5.0)
ANION GAP: 17 — AB (ref 5–15)
BUN: 22 mg/dL — AB (ref 6–20)
CO2: 25 mmol/L (ref 22–32)
Calcium: 8.7 mg/dL — ABNORMAL LOW (ref 8.9–10.3)
Chloride: 97 mmol/L — ABNORMAL LOW (ref 98–111)
Creatinine, Ser: 7.02 mg/dL — ABNORMAL HIGH (ref 0.61–1.24)
GFR, EST AFRICAN AMERICAN: 10 mL/min — AB (ref >60)
GFR, EST NON AFRICAN AMERICAN: 8 mL/min — AB (ref >60)
Glucose, Bld: 99 mg/dL (ref 70–99)
PHOSPHORUS: 3.6 mg/dL (ref 2.5–4.6)
Potassium: 3.6 mmol/L (ref 3.5–5.1)
Sodium: 139 mmol/L (ref 135–145)

## 2017-10-06 LAB — CBC
HCT: 39.9 % (ref 39.0–52.0)
Hemoglobin: 11.5 g/dL — ABNORMAL LOW (ref 13.0–17.0)
MCH: 26.7 pg (ref 26.0–34.0)
MCHC: 28.8 g/dL — ABNORMAL LOW (ref 30.0–36.0)
MCV: 92.6 fL (ref 78.0–100.0)
Platelets: 413 10*3/uL — ABNORMAL HIGH (ref 150–400)
RBC: 4.31 MIL/uL (ref 4.22–5.81)
RDW: 25.2 % — AB (ref 11.5–15.5)
WBC: 7.9 10*3/uL (ref 4.0–10.5)

## 2017-10-06 LAB — PROTIME-INR
INR: 1.5
PROTHROMBIN TIME: 18 s — AB (ref 11.4–15.2)

## 2017-10-06 LAB — GLUCOSE, CAPILLARY: Glucose-Capillary: 136 mg/dL — ABNORMAL HIGH (ref 70–99)

## 2017-10-06 LAB — HEPARIN LEVEL (UNFRACTIONATED)
HEPARIN UNFRACTIONATED: 1.36 [IU]/mL — AB (ref 0.30–0.70)
HEPARIN UNFRACTIONATED: 0.36 [IU]/mL (ref 0.30–0.70)

## 2017-10-06 MED ORDER — ALBUMIN HUMAN 25 % IV SOLN
INTRAVENOUS | Status: AC
  Administered 2017-10-06: 12.5 g via INTRAVENOUS
  Filled 2017-10-06: qty 50

## 2017-10-06 MED ORDER — VANCOMYCIN HCL IN DEXTROSE 750-5 MG/150ML-% IV SOLN
750.0000 mg | INTRAVENOUS | Status: AC
  Administered 2017-10-06: 750 mg via INTRAVENOUS

## 2017-10-06 MED ORDER — WARFARIN SODIUM 5 MG PO TABS
5.0000 mg | ORAL_TABLET | Freq: Once | ORAL | Status: AC
  Administered 2017-10-06: 5 mg via ORAL
  Filled 2017-10-06 (×2): qty 1

## 2017-10-06 MED ORDER — HEPARIN SODIUM (PORCINE) 1000 UNIT/ML IJ SOLN
INTRAMUSCULAR | Status: AC
  Administered 2017-10-06: 1000 [IU] via INTRAVENOUS
  Filled 2017-10-06: qty 1

## 2017-10-06 MED ORDER — HEPARIN SODIUM (PORCINE) 1000 UNIT/ML DIALYSIS: 20.0000 [IU]/kg | INTRAMUSCULAR | Status: DC | PRN

## 2017-10-06 MED ORDER — ALBUMIN HUMAN 25 % IV SOLN
12.5000 g | Freq: Once | INTRAVENOUS | Status: AC
  Administered 2017-10-06: 12.5 g via INTRAVENOUS

## 2017-10-06 MED ORDER — VANCOMYCIN HCL IN DEXTROSE 750-5 MG/150ML-% IV SOLN
INTRAVENOUS | Status: AC
  Filled 2017-10-06: qty 150

## 2017-10-06 MED ORDER — MIDODRINE HCL 5 MG PO TABS
ORAL_TABLET | ORAL | Status: AC
  Administered 2017-10-06: 15 mg via ORAL
  Filled 2017-10-06: qty 3

## 2017-10-06 NOTE — Procedures (Signed)
I was present at this dialysis session. I have reviewed the session itself and made appropriate changes.   Vital signs in last 24 hours:  Temp:  [97.6 F (36.4 C)-98.1 F (36.7 C)] 97.6 F (36.4 C) (09/20 0740) Pulse Rate:  [76-92] 78 (09/20 0815) Resp:  [15-26] 26 (09/20 0752) BP: (75-109)/(32-50) 101/33 (09/20 0815) SpO2:  [96 %-100 %] 99 % (09/20 0740) Weight:  [78.8 kg] 78.8 kg (09/20 0740) Weight change:  Filed Weights   10/04/17 1352 10/04/17 2126 10/06/17 0740  Weight: 78.4 kg 78.4 kg 78.8 kg    Recent Labs  Lab 10/04/17 1100  NA 138  K 3.4*  CL 97*  CO2 24  GLUCOSE 92  BUN 39*  CREATININE 9.77*  CALCIUM 7.9*  PHOS 4.2    Recent Labs  Lab 10/03/17 0612 10/04/17 0555 10/05/17 0416  WBC 8.1 8.7 8.7  HGB 11.5* 11.1* 11.3*  HCT 39.7 37.5* 38.3*  MCV 89.4 89.3 90.1  PLT 440* 371 388    Scheduled Meds: . aspirin  81 mg Oral Daily  . Chlorhexidine Gluconate Cloth  6 each Topical Q0600  . cinacalcet  60 mg Oral QHS  . escitalopram  20 mg Oral QHS  . midodrine  15 mg Oral TID  . multivitamin  1 tablet Oral Daily  . multivitamin  1 tablet Oral QHS  . pantoprazole  20 mg Oral Daily  . vancomycin variable dose per unstable renal function (pharmacist dosing)   Does not apply See admin instructions  . vitamin A  10,000 Units Oral Daily  . ascorbic acid  250 mg Oral BID  . Vitamin D (Ergocalciferol)  50,000 Units Oral Q7 days  . Warfarin - Pharmacist Dosing Inpatient   Does not apply q1800   Continuous Infusions: . heparin 1,050 Units/hr (10/06/17 0621)   PRN Meds:.chlorhexidine, heparin, heparin, lidocaine, RESOURCE THICKENUP CLEAR      Assessment/ Plan:  1. 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 TDC removed 09/27/17 new HD LIJ tdc placed 09/30/17. 2. 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 8 weekof abx and palliative care consulted.I did speak with Mr. Anderson MaltaRoyster that if he is not able to  tolerate IHD (as evidence of tachycardia and hypotension) that we would need to transition to comfort care as he is not a surgical candidate for repair. He understands and has been seen by Palliative care already.  3. ESRD- cont with HD qMWF for now 4. Hypotension- chronic and asymptomatic for now. Continue with midodrine 5. Vascular access- poorly functioning HD cath and may need to be changed/repositioned. 6. Disposition- poor prognosisand if he cannot tolerate HD tomorrow will need to transition to comfort care 7. H/o CVA and left hemiparesis. 8. Vascular access- had poor bfr with LIJ TDC andappreciateIR's assistance withexchange9/17/19. New LIJ tdc functioning much better.  Irena CordsJoseph A Davione Lenker,  MD 10/06/2017, 8:22 AM

## 2017-10-06 NOTE — Progress Notes (Signed)
Pharmacy Antibiotic Note  Perry [name] is a 47 y.o. male admitted on 09/27/2017 with bacteremia.  Pharmacy hasDana Allan been consulted for vancomycin dosing.  [facility]ing [facility]. Known HD patient with hx of bacteremia in April (underwent catheter exchange/treatment with vanc). Presented with SOB and fevers - BCx grew MRSA. TEE noting large vegetation at base of anterior mitral leaflet with suspected abscess. Will need 8 weeks of IV vanc per ID  [facility] and again [facility]/17   Pt tolerated HD 9/18 and received vanc 750mg  IV. Tolerated HD today.   Plan: - No standing Vancomycin for now - Vancomycin 750mg  IV x1 today - Will continue to follow HD schedule/duration and tolerance  Height: 5\' 6"  (167.6 cm) Weight: 173 lb 11.6 oz (78.8 kg) IBW/kg (Calculated) : 63.8  Temp (24hrs), Avg:97.9 F (36.6 C), Min:97.6 F (36.4 C), Max:98.1 F (36.7 C)  Recent Labs  Lab 09/30/17 2130 10/01/17 1121 10/02/17 0806 10/02/17 1931 10/03/17 0612 10/04/17 0555 10/04/17 1100 10/05/17 0416 10/06/17 0500 10/06/17 0757  WBC 8.2  --  8.0  --  8.1 8.7  --  8.7 7.9  --   CREATININE 10.90* 7.46* 8.57*  --   --   --  9.77*  --   --  7.02*  VANCORANDOM  --   --   --  21  --   --   --   --   --   --     Estimated Creatinine Clearance: 13 mL/min (A) (by C-G formula based on SCr of 7.02 mg/dL (H)).    Allergies  Allergen Reactions  . Depakote [Divalproex Sodium] Other (See Comments)    Hallucinations   . Hydromorphone Other (See Comments)    Antimicrobials this admission: Vancomycin 9/11 >> (11/1)  Dose adjustments this admission:  VR 9/11: 39 >> hold maintenance dosing VR 9/13: 35 >> hold maintenance dosing VR 9/16: 21 >> will follow-up dosing with HD  Microbiology results:  9/5 BCx: MRSA 9/6 Bcx: NGTD 9/6 MRSA PCR: pos 9/12 BCx >> ngtd  Thank you for allowing pharmacy to be a part of this patient's care.  Lenward Chancelloranya Dakiyah Heinke, PharmD PGY1 Pharmacy  Resident Phone [phone] 10/06/2017 11:36 AM

## 2017-10-06 NOTE — Progress Notes (Addendum)
ANTICOAGULATION CONSULT NOTE - Follow Up  Pharmacy Consult for Heparin and Warfarin  Indication: Hx of CVA and DVT   Patient Measurements: Height: 5\' 6"  (167.6 cm) Weight: 173 lb 11.6 oz (78.8 kg) IBW/kg (Calculated) : 63.8  Heparin Dosing Wt: 74 kg  Vital Signs: Temp: 97.6 F (36.4 C) (09/20 0740) Temp Source: Oral (09/20 0740) BP: 88/30 (09/20 1130) Pulse Rate: 87 (09/20 1130)  Labs: Recent Labs    10/04/17 0555 10/04/17 1100  10/05/17 0416  10/05/17 2207 10/06/17 0500 10/06/17 0757 10/06/17 1026  HGB 11.1*  --   --  11.3*  --   --  11.5*  --   --   HCT 37.5*  --   --  38.3*  --   --  39.9  --   --   PLT 371  --   --  388  --   --  413*  --   --   LABPROT 16.0*  --   --  17.4*  --   --  18.0*  --   --   INR 1.29  --   --  1.44  --   --  1.50  --   --   HEPARINUNFRC 0.60  --    < > 2.14*   < > 0.59 1.36*  --  0.36  CREATININE  --  9.77*  --   --   --   --   --  7.02*  --    < > = values in this interval not displayed.    Estimated Creatinine Clearance: 13 mL/min (A) (by C-G formula based on SCr of 7.02 mg/dL (H)).   Medications:  Scheduled:  . aspirin  81 mg Oral Daily  . Chlorhexidine Gluconate Cloth  6 each Topical Q0600  . cinacalcet  60 mg Oral QHS  . escitalopram  20 mg Oral QHS  . heparin      . midodrine  15 mg Oral TID  . multivitamin  1 tablet Oral Daily  . multivitamin  1 tablet Oral QHS  . pantoprazole  20 mg Oral Daily  . vancomycin variable dose per unstable renal function (pharmacist dosing)   Does not apply See admin instructions  . vitamin A  10,000 Units Oral Daily  . ascorbic acid  250 mg Oral BID  . Vitamin D (Ergocalciferol)  50,000 Units Oral Q7 days  . 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 Now complicated by septal perforation.NOT surgical candidate per CVTS.  Heparin  level therapeutic at 0.36 units/ml after holding Heparin for 1 hour and reducing to 1050 units/hr. No bleeding noted. Hgb and plts stable.   INR subtherapeutic at 1.5 today, warfarin therapy restarted yesterday. Takes 3mg  daily PTA.   Goal of Therapy:  INR 2-3  Heparin level 0.3-0.7 Monitor platelets by anticoagulation protocol: Yes   Plan:  Continue heparin at 1050 units/hr.  Warfarin 5mg  PO x1 again today Daily Heparin level, CBC and INR   Lenward Chancelloranya Cliff Damiani, PharmD PGY1 Pharmacy Resident Phone [phone] 10/06/2017 11:38 AM

## 2017-10-06 NOTE — Progress Notes (Signed)
PROGRESS NOTE    Perry [name]  ZOX:[id number] DOB: 11/22/1970 DOA: 09/27/2017 [facility]     Brief Narrative:   47 year old male with PMH notable for ESRD on HD via tunneled catheter. This was unfortunately complicated by bacteremia in April of this year. This was treated with catheter exchange and prolonged course of vancomycin. TEE was negative, but course was complicated by CVA during that admission, from which he now has residual L sided hemiparesis. He is reportedly bedbound and requires fully assisted care. He then [facility]11 with complaints of fevers and SOB. He was admitted and started on ABX to rule out PNA, however, blood cultures grew MRSA. He was narrowed to vancomycin. HD catheter was removed. He had TEE done 9/11 demonstrating large mitral vegetation with septal perforation tracking into the aortic valve, suspected abscess. 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 [facility] CVTS evaluation. He was deemed not a candidate for surgery by CVTS. Meanwhile HD started. If it was deemed he is not able to tolerate HD then plan to transition the patient to comfort care. He is currently getting HD today.   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  Plan for 8 weeks of IV vancomycin for endocarditis, via HD, recommend to follow up with ID in [place] in 6 to 8 weeks.  Repeat blood cultures neg till date. He is afebrile and no leukocytosis.      ESRD on HD MWF Further management as per nephrology.  IR replaced his left IJ tunneled HD catheter on 9/17. Awaiting outpatient HD to be set up  , now that he is able to tolerate HD.    S/o stroke: with residual left sided hemiparesis.  Continue with supportive care.  On coumadin, on heparin for bridging.  sub therapeutic INR. Pt currently on heparin till INR is therapeutic for discharge.  INR is 1.44 .   OSA ON CPAP. Pt non compliant to CPAP.   Mild anemia of chronic disease:  Hemoglobin stable at 11. No need for epogen.    Hypotension:  Resume midodrine as tolerated. He continues to have low bp all through the night.   In view of his multiple medical problems, palliative care consulted and recommendations noted. If he is able to tolerate HD. To continue with HD at SNF, if not able to tolerate despite midodrine, plan to transition to comfort care after speaking with the patient and family.    DVT prophylaxis: heparin and coumadin. Code Status: DNR Family Communication: none at bedside.  Disposition Plan: awaiting INR to be therapeutic prior to discharge and also new HD to be set up prior to d/c, monitor to see if he is able to tolerate HD at all.   Consultants:   Nephrology  palliative care   IR  CVTS.  Procedures: Left IJ tunneled catheter placement.   Antimicrobials:IV vancomycin for a total of 8 weeks.   Subjective: Appears comfortable no chest pain or sob.   Objective: Vitals:   10/06/17 0740 10/06/17 0748 10/06/17 0752 10/06/17 0800  BP: (!) 83/45 (!) 81/44 (!) 81/50 (!) 75/37  Pulse: 92 87 88 76  Resp: 15 (!) 25 (!) 26   Temp: 97.6 F (36.4 C)     TempSrc: Oral     SpO2: 99%     Weight: 78.8 kg     Height:        Intake/Output Summary (Last 24 hours) at 10/06/2017 0817 Last data filed at 10/06/2017 0600 Gross per 24 hour  Intake 511.3 ml  Output 0 ml  Net 511.3 ml   Filed Weights   10/04/17 1352 10/04/17 2126 10/06/17 0740  Weight: 78.4 kg 78.4 kg 78.8 kg    Examination:  General exam: Appears calm and comfortable not in any kind of distress.  Respiratory system: diminished at  bases, no wheezing heard.  Cardiovascular system: S1 & S2 heard, RRR. No JVD,  Gastrointestinal system: Abdomen is soft NT ND BS+ Central nervous system: Alert and oriented. Non focal.  Extremities: Symmetric 5 x 5 power. Skin: No rashes, lesions or ulcers Psychiatry: . Mood & affect appropriate.     Data Reviewed: I have personally reviewed following labs and imaging studies  CBC: Recent Labs  Lab 09/30/17 2130 10/02/17 0806 10/03/17 0612 10/04/17 0555 10/05/17 0416  WBC 8.2 8.0 8.1 8.7 8.7  HGB 11.3* 12.1* 11.5* 11.1* 11.3*  HCT 38.8* 41.9 39.7 37.5* 38.3*  MCV 89.4 90.1 89.4 89.3 90.1  PLT 570* 535* 440* 371 388   Basic Metabolic Panel: Recent Labs  Lab 09/30/17 2130 10/01/17 1121 10/02/17 0806 10/04/17 1100  NA 139 139 140 138  K 3.4* 3.1* 3.3* 3.4*  CL 96* 99 100 97*  CO2 23 23 26 24   GLUCOSE 108* 140* 95 92  BUN 56* 28* 35* 39*  CREATININE 10.90* 7.46* 8.57* 9.77*  CALCIUM 8.3* 8.4* 8.3* 7.9*  PHOS 3.8 2.9 3.9 4.2   GFR: Estimated Creatinine Clearance: 9.3 mL/min (A) (by C-G formula based on SCr of 9.77 mg/dL (H)). Liver Function Tests: Recent Labs  Lab 09/30/17 2130 10/01/17 1121 10/02/17 0806 10/04/17 1100  ALBUMIN 2.6* 2.6* 2.6* 2.7*   No results for input(s): LIPASE, AMYLASE in the last 168 hours. No results for input(s): AMMONIA in the last 168 hours. Coagulation Profile: Recent Labs  Lab 09/30/17 0253 10/02/17 0434 10/03/17 0612 10/04/17 0555 10/05/17 0416  INR 1.40 1.13 1.16 1.29 1.44   Cardiac Enzymes: No results for input(s): CKTOTAL, CKMB, CKMBINDEX, TROPONINI in the last 168 hours. BNP (last 3 results) No results for input(s): PROBNP in the last 8760 hours. HbA1C: No results for input(s): HGBA1C in the last 72 hours. CBG: No results for input(s): GLUCAP in the last 168 hours. Lipid Profile: No results for input(s): CHOL, HDL, LDLCALC, TRIG, CHOLHDL, LDLDIRECT in the last 72 hours. Thyroid Function Tests: No results for  input(s): TSH, T4TOTAL, FREET4, T3FREE, THYROIDAB in the last 72 hours. Anemia Panel: No results for input(s): VITAMINB12, FOLATE, FERRITIN, TIBC, IRON, RETICCTPCT in the last 72 hours. Sepsis Labs: No results for input(s): PROCALCITON, LATICACIDVEN in the last 168 hours.  Recent Results (from the past 240 hour(s))  Cath Tip Culture     Status: Abnormal   Collection Time: 09/27/17  9:07 AM  Result Value Ref Range Status   Specimen Description CATH TIP  Final   Special Requests   Final    NONE Performed at [facility], [address], [place], [place] [address]    Culture (A)  Final    800 COLONIES/mL METHICILLIN RESISTANT STAPHYLOCOCCUS AUREUS   Report Status 09/29/2017 FINAL  Final   Organism ID, Bacteria METHICILLIN RESISTANT STAPHYLOCOCCUS AUREUS (A)  Final      Susceptibility   Methicillin resistant staphylococcus aureus - MIC*    CIPROFLOXACIN >=8 RESISTANT Resistant     ERYTHROMYCIN >=8 RESISTANT Resistant     GENTAMICIN <=0.5 SENSITIVE Sensitive     OXACILLIN >=4 RESISTANT Resistant     TETRACYCLINE <=1 SENSITIVE Sensitive     VANCOMYCIN <=0.5 SENSITIVE Sensitive     TRIMETH/SULFA <=10 SENSITIVE Sensitive     CLINDAMYCIN <=0.25 SENSITIVE Sensitive     RIFAMPIN <=0.5 SENSITIVE Sensitive     Inducible Clindamycin NEGATIVE Sensitive     * 800 COLONIES/mL METHICILLIN RESISTANT STAPHYLOCOCCUS AUREUS  Culture, blood (Routine X 2) w Reflex to ID Panel     Status: None   Collection Time: 09/28/17  7:00 AM  Result Value Ref Range Status   Specimen Description BLOOD RIGHT ARM  Final   Special Requests   Final    BOTTLES DRAWN AEROBIC ONLY Blood Culture results may not be optimal due to an inadequate volume of blood received in culture bottles   Culture   Final    NO GROWTH 5 DAYS Performed at [facility], [address], [place], [place] [address]    Report Status 10/03/2017 FINAL  Final  Culture, blood (Routine X 2) w Reflex to ID Panel     Status: None    Collection Time: 09/28/17  7:10 AM  Result Value Ref Range Status   Specimen Description BLOOD RIGHT HAND  Final   Special Requests   Final    BOTTLES DRAWN AEROBIC ONLY Blood Culture results may not be optimal due to an inadequate volume of blood received in culture bottles   Culture   Final    NO GROWTH 5 DAYS Performed at [facility], [address] [place], [place] [address]    Report Status 10/03/2017 FINAL  Final         Radiology Studies: No results found.      Scheduled Meds: . aspirin  81 mg Oral Daily  . Chlorhexidine Gluconate Cloth  6 each Topical Q0600  . cinacalcet  60 mg Oral QHS  . escitalopram  20 mg Oral QHS  . midodrine  15 mg Oral TID  . multivitamin  1 tablet Oral Daily  . multivitamin  1 tablet Oral QHS  . pantoprazole  20 mg Oral Daily  . vancomycin variable dose per unstable renal function (pharmacist dosing)   Does not apply See admin instructions  . vitamin A  10,000 Units Oral Daily  . ascorbic acid  250 mg Oral BID  . Vitamin D (Ergocalciferol)  50,000 Units Oral Q7 days  . Warfarin - Pharmacist Dosing Inpatient   Does not apply q1800   Continuous Infusions: . heparin 1,050 Units/hr (10/06/17 0621)     LOS: 9 days    Time spent: 30 minutes    Kathlen Mody, MD [facility] Pager [phone]  If 7PM-7AM, please contact night-coverage [email or web address] Password [facility] 10/06/2017, 8:17 AM

## 2017-10-07 DIAGNOSIS — T8249XA Other complication of vascular dialysis catheter, initial encounter: Secondary | ICD-10-CM

## 2017-10-07 LAB — PROTIME-INR
INR: 1.54
Prothrombin Time: 18.3 seconds — ABNORMAL HIGH (ref 11.4–15.2)

## 2017-10-07 LAB — CBC
HCT: 42.5 % (ref 39.0–52.0)
Hemoglobin: 12.2 g/dL — ABNORMAL LOW (ref 13.0–17.0)
MCH: 26.3 pg (ref 26.0–34.0)
MCHC: 28.7 g/dL — ABNORMAL LOW (ref 30.0–36.0)
MCV: 91.8 fL (ref 78.0–100.0)
PLATELETS: 392 10*3/uL (ref 150–400)
RBC: 4.63 MIL/uL (ref 4.22–5.81)
RDW: 25.7 % — AB (ref 11.5–15.5)
WBC: 8.1 10*3/uL (ref 4.0–10.5)

## 2017-10-07 LAB — HEPARIN LEVEL (UNFRACTIONATED)
HEPARIN UNFRACTIONATED: 0.28 [IU]/mL — AB (ref 0.30–0.70)
Heparin Unfractionated: 0.25 IU/mL — ABNORMAL LOW (ref 0.30–0.70)

## 2017-10-07 MED ORDER — VANCOMYCIN HCL IN DEXTROSE 750-5 MG/150ML-% IV SOLN
750.0000 mg | INTRAVENOUS | Status: DC
  Administered 2017-10-09: 750 mg via INTRAVENOUS
  Filled 2017-10-07: qty 150

## 2017-10-07 MED ORDER — WARFARIN SODIUM 5 MG PO TABS
5.0000 mg | ORAL_TABLET | Freq: Once | ORAL | Status: AC
  Administered 2017-10-07: 5 mg via ORAL
  Filled 2017-10-07: qty 1

## 2017-10-07 MED ORDER — ACETAMINOPHEN 325 MG PO TABS
650.0000 mg | ORAL_TABLET | Freq: Four times a day (QID) | ORAL | Status: DC | PRN
  Administered 2017-10-07 – 2017-10-09 (×2): 650 mg via ORAL
  Filled 2017-10-07: qty 2

## 2017-10-07 NOTE — Progress Notes (Signed)
S: no events overnight and able to UF 2.5 liters with HD O:BP (!) 102/31 (BP Location: Right Arm)   Pulse 90   Temp 97.7 F (36.5 C) (Oral)   Resp 18   Ht 5\' 6"  (1.676 m)   Wt 76.2 kg   SpO2 97%   BMI 27.11 kg/m   Intake/Output Summary (Last 24 hours) at 10/07/2017 1209 Last data filed at 10/07/2017 1610 Gross per 24 hour  Intake 430.19 ml  Output 0 ml  Net 430.19 ml   Intake/Output: I/O last 3 completed shifts: In: 581.5 [P.O.:180; I.V.:401.5] Out: 2000 [Other:2000]  Intake/Output this shift:  No intake/output data recorded. Weight change:  Gen: NAD CVS: nor rub Resp: cta Abd: benign Ext: no edema  Recent Labs  Lab 09/30/17 2130 10/01/17 1121 10/02/17 0806 10/04/17 1100 10/06/17 0757  NA 139 139 140 138 139  K 3.4* 3.1* 3.3* 3.4* 3.6  CL 96* 99 100 97* 97*  CO2 23 23 26 24 25   GLUCOSE 108* 140* 95 92 99  BUN 56* 28* 35* 39* 22*  CREATININE 10.90* 7.46* 8.57* 9.77* 7.02*  ALBUMIN 2.6* 2.6* 2.6* 2.7* 3.1*  CALCIUM 8.3* 8.4* 8.3* 7.9* 8.7*  PHOS 3.8 2.9 3.9 4.2 3.6   Liver Function Tests: Recent Labs  Lab 10/02/17 0806 10/04/17 1100 10/06/17 0757  ALBUMIN 2.6* 2.7* 3.1*   No results for input(s): LIPASE, AMYLASE in the last 168 hours. No results for input(s): AMMONIA in the last 168 hours. CBC: Recent Labs  Lab 10/03/17 0612 10/04/17 0555 10/05/17 0416 10/06/17 0500 10/07/17 0548  WBC 8.1 8.7 8.7 7.9 8.1  HGB 11.5* 11.1* 11.3* 11.5* 12.2*  HCT 39.7 37.5* 38.3* 39.9 42.5  MCV 89.4 89.3 90.1 92.6 91.8  PLT 440* 371 388 413* 392   Cardiac Enzymes: No results for input(s): CKTOTAL, CKMB, CKMBINDEX, TROPONINI in the last 168 hours. CBG: Recent Labs  Lab 10/06/17 2114  GLUCAP 136*    Iron Studies: No results for input(s): IRON, TIBC, TRANSFERRIN, FERRITIN in the last 72 hours. Studies/Results: No results found. [name] aspirin  81 mg Oral Daily  . Chlorhexidine Gluconate Cloth  6 each Topical Q0600  . cinacalcet  60 mg Oral QHS  . escitalopram   20 mg Oral QHS  . midodrine  15 mg Oral TID  . multivitamin  1 tablet Oral Daily  . multivitamin  1 tablet Oral QHS  . pantoprazole  20 mg Oral Daily  . vancomycin variable dose per unstable renal function (pharmacist dosing)   Does not apply See admin instructions  . vitamin A  10,000 Units Oral Daily  . ascorbic acid  250 mg Oral BID  . Vitamin D (Ergocalciferol)  50,000 Units Oral Q7 days  . warfarin  5 mg Oral ONCE-1800  . Warfarin - Pharmacist Dosing Inpatient   Does not apply q1800    BMET    Component Value Date/Time   NA 139 10/06/2017 0757   NA 135 05/15/2014 0612   K 3.6 10/06/2017 0757   K 5.9 (H) 05/15/2014 0612   CL 97 (L) 10/06/2017 0757   CL 92 (L) 05/15/2014 0612   CO2 25 10/06/2017 0757   CO2 21 (L) 05/15/2014 0612   GLUCOSE 99 10/06/2017 0757   GLUCOSE 97 05/15/2014 0612   BUN 22 (H) 10/06/2017 0757   BUN 77 (H) 05/15/2014 0612   CREATININE 7.02 (H) 10/06/2017 0757   CREATININE 12.78 (HH) 07/03/2014 1458   CALCIUM 8.7 (L) 10/06/2017 0757  CALCIUM 8.6 (L) 05/15/2014 0612   GFRNONAA 8 (L) 10/06/2017 0757   GFRNONAA 4 (L) 07/03/2014 1458   GFRAA 10 (L) 10/06/2017 0757   GFRAA 5 (L) 07/03/2014 1458   CBC    Component Value Date/Time   WBC 8.1 10/07/2017 0548   RBC 4.63 10/07/2017 0548   HGB 12.2 (L) 10/07/2017 0548   HGB 13.7 05/15/2014 0612   HCT 42.5 10/07/2017 0548   HCT 43.6 05/15/2014 0612   PLT 392 10/07/2017 0548   PLT 461 (H) 05/15/2014 0612   MCV 91.8 10/07/2017 0548   MCV 95 05/15/2014 0612   MCH 26.3 10/07/2017 0548   MCHC 28.7 (L) 10/07/2017 0548   RDW 25.7 (H) 10/07/2017 0548   RDW 19.0 (H) 05/15/2014 0612   LYMPHSABS 0.2 (L) 09/21/2017 1754   LYMPHSABS 0.3 (L) 05/15/2014 0612   MONOABS 0.5 09/21/2017 1754   MONOABS 1.7 (H) 05/15/2014 0612   EOSABS 0.0 09/21/2017 1754   EOSABS 0.0 05/15/2014 0612   BASOSABS 0.0 09/21/2017 1754   BASOSABS 0.0 05/15/2014 0612     Assessment/ Plan:  1. MRSA bacteremia/endocarditis/abscess-  on Vanco and ID following. TDC removed 09/27/17 new HD LIJ tdc placed 09/30/17. 2. 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 8 weekof abx and palliative care consulted.I did speak with Perry [name] that if he is not able to tolerate IHD (as evidence of tachycardia and hypotension) that we would need to transition to comfort care as he is not a surgical candidate for repair. He understands and has been seen by Palliative care already.  3. ESRD- cont with HD qMWF for now 4. Hypotension- chronic and asymptomatic for now. Continue with midodrine 5. Vascular access- s/p exchange on 09/30/17 with improved functioning 6. Disposition- poor prognosisand if he cannot tolerate HD, will need to transition to comfort care 7. H/o CVA and left hemiparesis. 8. Vascular access- had poor bfr with LIJ TDC andappreciateIR's assistance withexchange9/17/19. New LIJ tdc functioning much better.   Perry CordsJoseph A. Jennell Janosik, MD [facility] [phone]

## 2017-10-07 NOTE — Progress Notes (Signed)
ANTICOAGULATION CONSULT NOTE - Follow Up  Pharmacy Consult for Heparin and Warfarin  Indication: Hx of CVA and DVT   Patient Measurements: Height: 5\' 6"  (167.6 cm) Weight: 167 lb 15.9 oz (76.2 kg) IBW/kg (Calculated) : 63.8  Heparin Dosing Wt: 74 kg  Vital Signs: Temp: 98.2 F (36.8 C) (09/21 1635) Temp Source: Oral (09/21 1635) BP: 109/30 (09/21 1635) Pulse Rate: 87 (09/21 1635)  Labs: Recent Labs    10/05/17 0416  10/06/17 0500 10/06/17 0757 10/06/17 1026 10/07/17 0548 10/07/17 1857  HGB 11.3*  --  11.5*  --   --  12.2*  --   HCT 38.3*  --  39.9  --   --  42.5  --   PLT 388  --  413*  --   --  392  --   LABPROT 17.4*  --  18.0*  --   --  18.3*  --   INR 1.44  --  1.50  --   --  1.54  --   HEPARINUNFRC 2.14*   < > 1.36*  --  0.36 0.25* 0.28*  CREATININE  --   --   --  7.02*  --   --   --    < > = values in this interval not displayed.    Estimated Creatinine Clearance: 11.9 mL/min (A) (by C-G formula based on SCr of 7.02 mg/dL (H)).  Assessment: [age] YOM ESRD on HD MWF admitted with sepsis secondary to MRSA mitral valve vegetation, which has likely grown into abscess. Now complicated by septal perforation.NOT surgical candidate per CVTS.  Heparin level is now subtherapeutic at 0.28. No bleeding noted. Hgb and plts stable. INR also remains low.   Goal of Therapy:  INR 2-3  Heparin level 0.3-0.7 Monitor platelets by anticoagulation protocol: Yes   Plan:  Increase heparin gtt 1300 units/hr Check an 8 hr heparin level Daily heparin level and CBC Warfarin 5mg  PO x 1 tonight Daily INR  Lysle Pearlachel Roseland Braun, PharmD, BCPS Please see AMION for all pharmacy numbers 10/07/2017 8:17 PM

## 2017-10-07 NOTE — Progress Notes (Signed)
PROGRESS NOTE  KARTER HELLMER YQM:[id number] DOB: [birth year] DOA: 09/27/2017 PCP: [facility]  HPI/Brief Narrative  Perry [name] is a 47 y.o. year old male with medical history significant for history of stroke which is left him bedridden and totally dependent on all ADLs, ESRD on HD, HTN, previous history of MRSA bacteremia treated with 4 weeks of vancomycin on 04/2017 who initially presented to [facility] on 9/5-9/11  with fevers and SOB treated empirically for possible PNA but was found to have MRSA bacteremia and treated with vancomycin.  His HD catheter was removed and  TEE done demonstrated a large mitral vegetation with concern for associated abscess prompting transfer to [facility], for cardio\thoracic surgery evaluation on 09/27/17.  Due to his poor functional status (recent CVA with left-sided weakness, dependent on all ADLs) he was deemed to not be a surgical candidate, additionally patient did not wish to have risky surgery.  He has remained afebrile since admission with no new signs of ongoing bacteremia.  ID recommended continuing course of 8 weeks of IV vancomycin post HD.  Subjective No complaints  Assessment/Plan:  #MRSA endocarditis with abscess and bacteremia.  Tolerating vancomycin, ID recommends continuing for 8 weeks with HD, end date November 1.  Repeat blood cultures remain negative.  Infection likely related to hemodialysis catheter.  New HD catheter placed on 09/30/2017.  Not a surgical candidate per cardiothoracic vascular surgery.  Plan to follow with Dr. Simon Rhein in [place] in 6 to 8 weeks and discuss possible chronic suppression  #ESRD on HD, MWF, blood pressure tolerating UF sessions. Previously HD at home, awaiting outpatient setup especially to continue vancomycin dosing  Chronic hypotension, asymptomatic.  Continue Midrin  #History of CVA with left hemiparesis/functional quadriplegia. Interactive on exam. Conversant  though has difficulty finding words. Heparin bridge while INR subtherapeutic   Code Status: DNR( discussed with palliative care consultants)   Family Communication: mother present   Disposition Plan: Awaiting therapeutic INR, currently on heparin bridge, continue to maintain normal blood pressure during HD sessions, continue IV vancomycin for MRSA endocarditis  Consultants:  Treatment Team:   Maxie Barb, MD    Procedures:  9/11- TEE, large vegetation or anterior mitral leaflet, septal perforation tracking of the aortic valve, concerning for abscess, severe aortic valve regurgitation and severe MR  9/9- TEE no evidence of vegetation  Antimicrobials: Anti-infectives (From admission, onward)   Start     Dose/Rate Route Frequency Ordered Stop   10/09/17 1200  vancomycin (VANCOCIN) IVPB 750 mg/150 ml premix     750 mg 150 mL/hr over 60 Minutes Intravenous Every M-W-F (Hemodialysis) 10/07/17 1052 10/11/17 1159   10/06/17 1200  vancomycin (VANCOCIN) IVPB 750 mg/150 ml premix     750 mg 150 mL/hr over 60 Minutes Intravenous Every Fri (Hemodialysis) 10/06/17 1053 10/06/17 1205   10/06/17 1102  Vancomycin (VANCOCIN) 750-5 MG/150ML-% IVPB    Note to Pharmacy:  Verna Czech   : cabinet override      10/06/17 1102 10/06/17 1113   10/04/17 1515  vancomycin (VANCOCIN) IVPB 750 mg/150 ml premix     750 mg 150 mL/hr over 60 Minutes Intravenous  Once 10/04/17 1507 10/04/17 1708   10/04/17 1345  vancomycin (VANCOCIN) IVPB 1000 mg/200 mL premix  Status:  Discontinued     1,000 mg 200 mL/hr over 60 Minutes Intravenous  Once 10/04/17 1336 10/04/17 1507   10/03/17 1603  ceFAZolin (ANCEF) 2-4 GM/100ML-% IVPB    Note  to Pharmacy:  Gaspar Skeeters   : cabinet override      10/03/17 1603 10/04/17 0414   09/30/17 1728  ceFAZolin (ANCEF) IVPB 1 g/50 mL premix     over 30 Minutes Intravenous Continuous PRN 09/30/17 1728 09/30/17 1715   09/30/17 1547  ceFAZolin (ANCEF) 2-4 GM/100ML-% IVPB     Note to Pharmacy:  Channing Mutters   : cabinet override      09/30/17 1547 09/30/17 1803   09/28/17 0913  vancomycin variable dose per unstable renal function (pharmacist dosing)      Does not apply See admin instructions 09/28/17 0913           Cultures:  9/6- negative blood cultures  9/11+ MRSA blood culture  9/12-negative blood cultures x2  Telemetry: Yes  DVT prophylaxis: Warfarin   Objective: Vitals:   10/07/17 0459 10/07/17 0915 10/07/17 1635 10/07/17 2110  BP: (!) 121/41 (!) 102/31 (!) 109/30 (!) 118/35  Pulse: 89 90 87 88  Resp: 17 18 18 20   Temp: 98 F (36.7 C) 97.7 F (36.5 C) 98.2 F (36.8 C) 98.4 F (36.9 C)  TempSrc:  Oral Oral Oral  SpO2: 98% 97% 99% 97%  Weight:      Height:        Intake/Output Summary (Last 24 hours) at 10/07/2017 2130 Last data filed at 10/07/2017 1700 Gross per 24 hour  Intake 370.19 ml  Output 0 ml  Net 370.19 ml   Filed Weights   10/04/17 2126 10/06/17 0740 10/06/17 1148  Weight: 78.4 kg 78.8 kg 76.2 kg    Exam:  Constitutional:chronically ill appearing male, no disress Eyes: EOMI, anicteric, normal conjunctivae ENMT: Oropharynx with moist mucous membranes, normal dentition Cardiovascular: RRR no MRGs, with no peripheral edema Respiratory: Normal respiratory effort, clear breath sounds  Abdomen: Soft,non-tender,  Skin: No rash ulcers, or lesions. Without skin tenting  Neurologic: left hemiparesis. No new deficits Psychiatric:flat affect, appropriate mood. Mental status AAOx3  Data Reviewed: CBC: Recent Labs  Lab 10/03/17 0612 10/04/17 0555 10/05/17 0416 10/06/17 0500 10/07/17 0548  WBC 8.1 8.7 8.7 7.9 8.1  HGB 11.5* 11.1* 11.3* 11.5* 12.2*  HCT 39.7 37.5* 38.3* 39.9 42.5  MCV 89.4 89.3 90.1 92.6 91.8  PLT 440* 371 388 413* 392   Basic Metabolic Panel: Recent Labs  Lab 10/01/17 1121 10/02/17 0806 10/04/17 1100 10/06/17 0757  NA 139 140 138 139  K 3.1* 3.3* 3.4* 3.6  CL 99 100 97* 97*  CO2 23  26 24 25   GLUCOSE 140* 95 92 99  BUN 28* 35* 39* 22*  CREATININE 7.46* 8.57* 9.77* 7.02*  CALCIUM 8.4* 8.3* 7.9* 8.7*  PHOS 2.9 3.9 4.2 3.6   GFR: Estimated Creatinine Clearance: 11.9 mL/min (A) (by C-G formula based on SCr of 7.02 mg/dL (H)). Liver Function Tests: Recent Labs  Lab 10/01/17 1121 10/02/17 0806 10/04/17 1100 10/06/17 0757  ALBUMIN 2.6* 2.6* 2.7* 3.1*   No results for input(s): LIPASE, AMYLASE in the last 168 hours. No results for input(s): AMMONIA in the last 168 hours. Coagulation Profile: Recent Labs  Lab 10/03/17 0612 10/04/17 0555 10/05/17 0416 10/06/17 0500 10/07/17 0548  INR 1.16 1.29 1.44 1.50 1.54   Cardiac Enzymes: No results for input(s): CKTOTAL, CKMB, CKMBINDEX, TROPONINI in the last 168 hours. BNP (last 3 results) No results for input(s): PROBNP in the last 8760 hours. HbA1C: No results for input(s): HGBA1C in the last 72 hours. CBG: Recent Labs  Lab 10/06/17 2114  GLUCAP 136*  Lipid Profile: No results for input(s): CHOL, HDL, LDLCALC, TRIG, CHOLHDL, LDLDIRECT in the last 72 hours. Thyroid Function Tests: No results for input(s): TSH, T4TOTAL, FREET4, T3FREE, THYROIDAB in the last 72 hours. Anemia Panel: No results for input(s): VITAMINB12, FOLATE, FERRITIN, TIBC, IRON, RETICCTPCT in the last 72 hours. Urine analysis: No results found for: COLORURINE, APPEARANCEUR, LABSPEC, PHURINE, GLUCOSEU, HGBUR, BILIRUBINUR, KETONESUR, PROTEINUR, UROBILINOGEN, NITRITE, LEUKOCYTESUR Sepsis Labs: @LABRCNTIP (procalcitonin:4,lacticidven:4)  ) Recent Results (from the past 240 hour(s))  Culture, blood (Routine X 2) w Reflex to ID Panel     Status: None   Collection Time: 09/28/17  7:00 AM  Result Value Ref Range Status   Specimen Description BLOOD RIGHT ARM  Final   Special Requests   Final    BOTTLES DRAWN AEROBIC ONLY Blood Culture results may not be optimal due to an inadequate volume of blood received in culture bottles   Culture   Final     NO GROWTH 5 DAYS Performed at [facility], [address] [place], [place] [address]    Report Status 10/03/2017 FINAL  Final  Culture, blood (Routine X 2) w Reflex to ID Panel     Status: None   Collection Time: 09/28/17  7:10 AM  Result Value Ref Range Status   Specimen Description BLOOD RIGHT HAND  Final   Special Requests   Final    BOTTLES DRAWN AEROBIC ONLY Blood Culture results may not be optimal due to an inadequate volume of blood received in culture bottles   Culture   Final    NO GROWTH 5 DAYS Performed at [facility], [address] [place], [place] [address]    Report Status 10/03/2017 FINAL  Final      Studies: No results found.  Scheduled Meds: . aspirin  81 mg Oral Daily  . Chlorhexidine Gluconate Cloth  6 each Topical Q0600  . cinacalcet  60 mg Oral QHS  . escitalopram  20 mg Oral QHS  . midodrine  15 mg Oral TID  . multivitamin  1 tablet Oral Daily  . multivitamin  1 tablet Oral QHS  . pantoprazole  20 mg Oral Daily  . vancomycin variable dose per unstable renal function (pharmacist dosing)   Does not apply See admin instructions  . vitamin A  10,000 Units Oral Daily  . ascorbic acid  250 mg Oral BID  . Vitamin D (Ergocalciferol)  50,000 Units Oral Q7 days  . Warfarin - Pharmacist Dosing Inpatient   Does not apply q1800    Continuous Infusions: . heparin 1,150 Units/hr (10/07/17 1123)  . [START ON 10/09/2017] vancomycin       LOS: 10 days     Laverna PeaceShayla D Nettey, MD [facility] Pager [phone]  If 7PM-7AM, please contact night-coverage [email or web address] Password [email or web address] 10/07/2017, 9:30 PM

## 2017-10-07 NOTE — Progress Notes (Signed)
Patient refused CPAP for tonight 

## 2017-10-08 DIAGNOSIS — I9589 Other hypotension: Secondary | ICD-10-CM | POA: Diagnosis present

## 2017-10-08 LAB — CBC
HEMATOCRIT: 41.9 % (ref 39.0–52.0)
Hemoglobin: 12.6 g/dL — ABNORMAL LOW (ref 13.0–17.0)
MCH: 27.3 pg (ref 26.0–34.0)
MCHC: 30.1 g/dL (ref 30.0–36.0)
MCV: 90.7 fL (ref 78.0–100.0)
Platelets: 347 10*3/uL (ref 150–400)
RBC: 4.62 MIL/uL (ref 4.22–5.81)
RDW: 25.4 % — AB (ref 11.5–15.5)
WBC: 11 10*3/uL — ABNORMAL HIGH (ref 4.0–10.5)

## 2017-10-08 LAB — HEPARIN LEVEL (UNFRACTIONATED): Heparin Unfractionated: 0.3 IU/mL (ref 0.30–0.70)

## 2017-10-08 LAB — PROTIME-INR
INR: 1.99
Prothrombin Time: 22.4 seconds — ABNORMAL HIGH (ref 11.4–15.2)

## 2017-10-08 MED ORDER — WARFARIN SODIUM 5 MG PO TABS
5.0000 mg | ORAL_TABLET | Freq: Once | ORAL | Status: AC
  Administered 2017-10-08: 5 mg via ORAL
  Filled 2017-10-08: qty 1

## 2017-10-08 NOTE — Progress Notes (Signed)
ANTICOAGULATION CONSULT NOTE - Follow Up  Pharmacy Consult for Heparin and Warfarin  Indication: Hx of CVA and DVT   Patient Measurements: Height: 5\' 6"  (167.6 cm) Weight: 167 lb 15.9 oz (76.2 kg) IBW/kg (Calculated) : 63.8  Heparin Dosing Wt: 74 kg  Vital Signs: Temp: 98.4 F (36.9 C) (09/22 0612) Temp Source: Oral (09/22 0612) BP: 95/30 (09/22 0612) Pulse Rate: 87 (09/22 0612)  Labs: Recent Labs    10/06/17 0500 10/06/17 0757  10/07/17 0548 10/07/17 1857 10/08/17 0459  HGB 11.5*  --   --  12.2*  --   --   HCT 39.9  --   --  42.5  --   --   PLT 413*  --   --  392  --   --   LABPROT 18.0*  --   --  18.3*  --  22.4*  INR 1.50  --   --  1.54  --  1.99  HEPARINUNFRC 1.36*  --    < > 0.25* 0.28* 0.30  CREATININE  --  7.02*  --   --   --   --    < > = values in this interval not displayed.    Estimated Creatinine Clearance: 11.9 mL/min (A) (by C-G formula based on SCr of 7.02 mg/dL (H)).  Assessment: 89 YOM ESRD on HD MWF admitted with sepsis secondary to MRSA mitral valve vegetation, which has likely grown into abscess. Now complicated by septal perforation.NOT surgical candidate per CVTS.  Heparin level is now 0.30. No bleeding noted. INR 1.99   Goal of Therapy:  INR 2-3  Heparin level 0.3-0.7 Monitor platelets by anticoagulation protocol: Yes   Plan:  Continue heparin gtt 1300 units/hr Daily heparin level and CBC Warfarin 5mg  PO x 1 tonight Daily INR  Thanks for allowing pharmacy to be a part of this patient's care.  Talbert CageLora Parley Pidcock, PharmD Clinical Pharmacist  10/08/2017 6:48 AM

## 2017-10-08 NOTE — Progress Notes (Signed)
S:No complaints O:BP (!) 100/34 (BP Location: Left Arm)   Pulse 82   Temp (!) 97.5 F (36.4 C) (Oral)   Resp 20   Ht 5\' 6"  (1.676 m)   Wt 74.4 kg   SpO2 100%   BMI 26.47 kg/m   Intake/Output Summary (Last 24 hours) at 10/08/2017 1144 Last data filed at 10/08/2017 0600 Gross per 24 hour  Intake 195.41 ml  Output 0 ml  Net 195.41 ml   Intake/Output: I/O last 3 completed shifts: In: 505.6 [P.O.:120; I.V.:385.6] Out: 0   Intake/Output this shift:  No intake/output data recorded. Weight change: -4.4 kg Gen: weak and fatigued AAM in NAD CVS: no rub Resp: cta Abd: benign Ext: no edema  Recent Labs  Lab 10/02/17 0806 10/04/17 1100 10/06/17 0757  NA 140 138 139  K 3.3* 3.4* 3.6  CL 100 97* 97*  CO2 26 24 25   GLUCOSE 95 92 99  BUN 35* 39* 22*  CREATININE 8.57* 9.77* 7.02*  ALBUMIN 2.6* 2.7* 3.1*  CALCIUM 8.3* 7.9* 8.7*  PHOS 3.9 4.2 3.6   Liver Function Tests: Recent Labs  Lab 10/02/17 0806 10/04/17 1100 10/06/17 0757  ALBUMIN 2.6* 2.7* 3.1*   No results for input(s): LIPASE, AMYLASE in the last 168 hours. No results for input(s): AMMONIA in the last 168 hours. CBC: Recent Labs  Lab 10/04/17 0555 10/05/17 0416 10/06/17 0500 10/07/17 0548 10/08/17 0752  WBC 8.7 8.7 7.9 8.1 11.0*  HGB 11.1* 11.3* 11.5* 12.2* 12.6*  HCT 37.5* 38.3* 39.9 42.5 41.9  MCV 89.3 90.1 92.6 91.8 90.7  PLT 371 388 413* 392 347   Cardiac Enzymes: No results for input(s): CKTOTAL, CKMB, CKMBINDEX, TROPONINI in the last 168 hours. CBG: Recent Labs  Lab 10/06/17 2114  GLUCAP 136*    Iron Studies: No results for input(s): IRON, TIBC, TRANSFERRIN, FERRITIN in the last 72 hours. Studies/Results: No results found. [name]. aspirin  81 mg Oral Daily  . Chlorhexidine Gluconate Cloth  6 each Topical Q0600  . cinacalcet  60 mg Oral QHS  . escitalopram  20 mg Oral QHS  . midodrine  15 mg Oral TID  . multivitamin  1 tablet Oral Daily  . multivitamin  1 tablet Oral QHS  . pantoprazole  20  mg Oral Daily  . vancomycin variable dose per unstable renal function (pharmacist dosing)   Does not apply See admin instructions  . vitamin A  10,000 Units Oral Daily  . ascorbic acid  250 mg Oral BID  . Vitamin D (Ergocalciferol)  50,000 Units Oral Q7 days  . warfarin  5 mg Oral ONCE-1800  . Warfarin - Pharmacist Dosing Inpatient   Does not apply q1800    BMET    Component Value Date/Time   NA 139 10/06/2017 0757   NA 135 05/15/2014 0612   K 3.6 10/06/2017 0757   K 5.9 (H) 05/15/2014 0612   CL 97 (L) 10/06/2017 0757   CL 92 (L) 05/15/2014 0612   CO2 25 10/06/2017 0757   CO2 21 (L) 05/15/2014 0612   GLUCOSE 99 10/06/2017 0757   GLUCOSE 97 05/15/2014 0612   BUN 22 (H) 10/06/2017 0757   BUN 77 (H) 05/15/2014 0612   CREATININE 7.02 (H) 10/06/2017 0757   CREATININE 12.78 (HH) 07/03/2014 1458   CALCIUM 8.7 (L) 10/06/2017 0757   CALCIUM 8.6 (L) 05/15/2014 0612   GFRNONAA 8 (L) 10/06/2017 0757   GFRNONAA 4 (L) 07/03/2014 1458   GFRAA 10 (L) 10/06/2017 [id number]  GFRAA 5 (L) 07/03/2014 1458   CBC    Component Value Date/Time   WBC 11.0 (H) 10/08/2017 0752   RBC 4.62 10/08/2017 0752   HGB 12.6 (L) 10/08/2017 0752   HGB 13.7 05/15/2014 0612   HCT 41.9 10/08/2017 0752   HCT 43.6 05/15/2014 0612   PLT 347 10/08/2017 0752   PLT 461 (H) 05/15/2014 0612   MCV 90.7 10/08/2017 0752   MCV 95 05/15/2014 0612   MCH 27.3 10/08/2017 0752   MCHC 30.1 10/08/2017 0752   RDW 25.4 (H) 10/08/2017 0752   RDW 19.0 (H) 05/15/2014 0612   LYMPHSABS 0.2 (L) 09/21/2017 1754   LYMPHSABS 0.3 (L) 05/15/2014 0612   MONOABS 0.5 09/21/2017 1754   MONOABS 1.7 (H) 05/15/2014 0612   EOSABS 0.0 09/21/2017 1754   EOSABS 0.0 05/15/2014 0612   BASOSABS 0.0 09/21/2017 1754   BASOSABS 0.0 05/15/2014 0612      Assessment/ Plan:  1. 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 TDC removed 09/27/17 new HD LIJ tdc placed 09/30/17. 2. 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 8 weekof abx and palliative care consulted.I did speak with Perry [name] that if he is not able to tolerate IHD (as evidence of tachycardia and hypotension) that we would need to transition to comfort care as he is not a surgical candidate for repair. He understands and has been seen by Palliative care already.  3. ESRD- cont with HD qMWF for now.  He is very weak and unable to move independently.  Will need to make sure he is able to tolerate HD in a chair prior to discharge. 4. Hypotension- chronic and asymptomatic for now. Continue with midodrine 5. Vascular access- s/p exchange on 09/30/17 with improved functioning 6. Disposition- poor prognosisand if he cannot tolerate HD, will need to transition to comfort care 7. H/o CVA and left hemiparesis. 8. Moderate protein malnutrition 9. Vascular access- had poor bfr with LIJ TDC andappreciateIR's assistance withexchange9/17/19. New LIJ tdc functioning much better.   Irena Cords, MD [employer] [phone]

## 2017-10-08 NOTE — Progress Notes (Signed)
PROGRESS NOTE  Perry [name] ZOX:[id number] DOB: 05-20-1970 DOA: 09/27/2017 PCP: [facility]  HPI/Brief Narrative  Perry [name] is a 47 y.o. year old male with medical history significant for history of stroke which is left him bedridden and totally dependent on all ADLs, ESRD on HD, HTN, previous history of MRSA bacteremia treated with 4 weeks of vancomycin on 04/2017 who initially presented to [facility] at [facility] on 9/5-9/11  with fevers and SOB treated empirically for possible PNA but was found to have MRSA bacteremia and treated with vancomycin.  His HD catheter was removed and  TEE done demonstrated a large mitral vegetation with concern for associated abscess prompting transfer to [facility], for cardio\thoracic surgery evaluation on 09/27/17.  Due to his poor functional status (recent CVA with left-sided weakness, dependent on all ADLs) he was deemed to not be a surgical candidate, additionally patient did not wish to have risky surgery.  He has remained afebrile since admission with no new signs of ongoing bacteremia.  ID recommended continuing course of 8 weeks of IV vancomycin post HD.  Subjective No complaints  Assessment/Plan:  #MRSA endocarditis with abscess and bacteremia.  Tolerating vancomycin, ID recommends continuing for 8 weeks with HD, end date November 1.  Repeat blood cultures remain negative.  Infection likely related to hemodialysis catheter.  New HD catheter placed on 09/30/2017.  Not a surgical candidate per cardiothoracic vascular surgery.  Plan to follow with Perry [name] in [place] in 6 to 8 weeks and discuss possible chronic suppression  #ESRD on HD, MWF, blood pressure tolerating UF sessions. Already has outpt HD dialysis, need to ensure BP/HR can tolerate, will assess again on 9/23 on MWF schedule  Chronic hypotension, asymptomatic.  Continue Midrin  #History of CVA with left hemiparesis/functional quadriplegia. Interactive  on exam. Conversant though has difficulty finding words. Heparin bridge while INR subtherapeutic, pharmacy managing   Code Status: DNR( discussed with palliative care consultants)   Family Communication: will call mother  Disposition Plan: Awaiting therapeutic INR, currently on heparin bridge, continue to maintain normal blood pressure during HD sessions, continue IV vancomycin for MRSA endocarditis. If tolerates HD on 9/23 likely able to discharge home  Consultants: Treatment Team:  Perry Barb, MD    Procedures:  9/11- TEE, large vegetation or anterior mitral leaflet, septal perforation tracking of the aortic valve, concerning for abscess, severe aortic valve regurgitation and severe MR  9/9- TEE no evidence of vegetation  Antimicrobials: Anti-infectives (From admission, onward)   Start     Dose/Rate Route Frequency Ordered Stop   10/09/17 1200  vancomycin (VANCOCIN) IVPB 750 mg/150 ml premix     750 mg 150 mL/hr over 60 Minutes Intravenous Every M-W-F (Hemodialysis) 10/07/17 1052 10/11/17 1159   10/06/17 1200  vancomycin (VANCOCIN) IVPB 750 mg/150 ml premix     750 mg 150 mL/hr over 60 Minutes Intravenous Every Fri (Hemodialysis) 10/06/17 1053 10/06/17 1205   10/06/17 1102  Vancomycin (VANCOCIN) 750-5 MG/150ML-% IVPB    Note to Pharmacy:  Perry [name]   : cabinet override      10/06/17 1102 10/06/17 1113   10/04/17 1515  vancomycin (VANCOCIN) IVPB 750 mg/150 ml premix     750 mg 150 mL/hr over 60 Minutes Intravenous  Once 10/04/17 1507 10/04/17 1708   10/04/17 1345  vancomycin (VANCOCIN) IVPB 1000 mg/200 mL premix  Status:  Discontinued     1,000 mg 200 mL/hr over 60 Minutes Intravenous  Once 10/04/17  1336 10/04/17 1507   10/03/17 1603  ceFAZolin (ANCEF) 2-4 GM/100ML-% IVPB    Note to Pharmacy:  Perry [name]   : cabinet override      10/03/17 1603 10/04/17 0414   09/30/17 1728  ceFAZolin (ANCEF) IVPB 1 g/50 mL premix     over 30 Minutes Intravenous  Continuous PRN 09/30/17 1728 09/30/17 1715   09/30/17 1547  ceFAZolin (ANCEF) 2-4 GM/100ML-% IVPB    Note to Pharmacy:  Perry [name]   : cabinet override      09/30/17 1547 09/30/17 1803   09/28/17 0913  vancomycin variable dose per unstable renal function (pharmacist dosing)      Does not apply See admin instructions 09/28/17 0913          Cultures:  9/6- negative blood cultures  9/11+ MRSA blood culture  9/12-negative blood cultures x2  Telemetry: Yes  DVT prophylaxis: Warfarin   Objective: Vitals:   10/07/17 2110 10/08/17 0612 10/08/17 0700 10/08/17 0930  BP: (!) 118/35 (!) 95/30  (!) 100/34  Pulse: 88 87  82  Resp: 20 20  20   Temp: 98.4 F (36.9 C) 98.4 F (36.9 C)  (!) 97.5 F (36.4 C)  TempSrc: Oral Oral  Oral  SpO2: 97% 98%  100%  Weight:   74.4 kg   Height:        Intake/Output Summary (Last 24 hours) at 10/08/2017 1347 Last data filed at 10/08/2017 0600 Gross per 24 hour  Intake 135.41 ml  Output 0 ml  Net 135.41 ml   Filed Weights   10/06/17 0740 10/06/17 1148 10/08/17 0700  Weight: 78.8 kg 76.2 kg 74.4 kg    Exam:  Constitutional:chronically ill appearing male, no distress Eyes: EOMI, anicteric, normal conjunctivae ENMT: Oropharynx with moist mucous membranes, normal dentition Cardiovascular: RRR with  Murmur heard in LUSB, with no peripheral edema Respiratory: Normal respiratory effort on room air, good air movement Neurologic: left hemiparesis. No new deficits Psychiatric:flat affect, appropriate mood. Mental status AAOx3  Data Reviewed: CBC: Recent Labs  Lab 10/04/17 0555 10/05/17 0416 10/06/17 0500 10/07/17 0548 10/08/17 0752  WBC 8.7 8.7 7.9 8.1 11.0*  HGB 11.1* 11.3* 11.5* 12.2* 12.6*  HCT 37.5* 38.3* 39.9 42.5 41.9  MCV 89.3 90.1 92.6 91.8 90.7  PLT 371 388 413* 392 347   Basic Metabolic Panel: Recent Labs  Lab 10/02/17 0806 10/04/17 1100 10/06/17 0757  NA 140 138 139  K 3.3* 3.4* 3.6  CL 100 97* 97*  CO2 26 24 25    GLUCOSE 95 92 99  BUN 35* 39* 22*  CREATININE 8.57* 9.77* 7.02*  CALCIUM 8.3* 7.9* 8.7*  PHOS 3.9 4.2 3.6   GFR: Estimated Creatinine Clearance: 11.9 mL/min (A) (by C-G formula based on SCr of 7.02 mg/dL (H)). Liver Function Tests: Recent Labs  Lab 10/02/17 0806 10/04/17 1100 10/06/17 0757  ALBUMIN 2.6* 2.7* 3.1*   No results for input(s): LIPASE, AMYLASE in the last 168 hours. No results for input(s): AMMONIA in the last 168 hours. Coagulation Profile: Recent Labs  Lab 10/04/17 0555 10/05/17 0416 10/06/17 0500 10/07/17 0548 10/08/17 0459  INR 1.29 1.44 1.50 1.54 1.99   Cardiac Enzymes: No results for input(s): CKTOTAL, CKMB, CKMBINDEX, TROPONINI in the last 168 hours. BNP (last 3 results) No results for input(s): PROBNP in the last 8760 hours. HbA1C: No results for input(s): HGBA1C in the last 72 hours. CBG: Recent Labs  Lab 10/06/17 2114  GLUCAP 136*   Lipid Profile: No results for input(s):  CHOL, HDL, LDLCALC, TRIG, CHOLHDL, LDLDIRECT in the last 72 hours. Thyroid Function Tests: No results for input(s): TSH, T4TOTAL, FREET4, T3FREE, THYROIDAB in the last 72 hours. Anemia Panel: No results for input(s): VITAMINB12, FOLATE, FERRITIN, TIBC, IRON, RETICCTPCT in the last 72 hours. Urine analysis: No results found for: COLORURINE, APPEARANCEUR, LABSPEC, PHURINE, GLUCOSEU, HGBUR, BILIRUBINUR, KETONESUR, PROTEINUR, UROBILINOGEN, NITRITE, LEUKOCYTESUR Sepsis Labs: @LABRCNTIP (procalcitonin:4,lacticidven:4)  ) No results found for this or any previous visit (from the past 240 hour(s)).    Studies: No results found.  Scheduled Meds: . aspirin  81 mg Oral Daily  . Chlorhexidine Gluconate Cloth  6 each Topical Q0600  . cinacalcet  60 mg Oral QHS  . escitalopram  20 mg Oral QHS  . midodrine  15 mg Oral TID  . multivitamin  1 tablet Oral Daily  . multivitamin  1 tablet Oral QHS  . pantoprazole  20 mg Oral Daily  . vancomycin variable dose per unstable renal  function (pharmacist dosing)   Does not apply See admin instructions  . vitamin A  10,000 Units Oral Daily  . ascorbic acid  250 mg Oral BID  . Vitamin D (Ergocalciferol)  50,000 Units Oral Q7 days  . warfarin  5 mg Oral ONCE-1800  . Warfarin - Pharmacist Dosing Inpatient   Does not apply q1800    Continuous Infusions: . heparin 1,300 Units/hr (10/08/17 0724)  . [START ON 10/09/2017] vancomycin       LOS: 11 days     Laverna PeaceShayla D Nettey, MD [facility] Pager [phone]  If 7PM-7AM, please contact night-coverage [email or web address] Password [email or web address] 10/08/2017, 1:47 PM

## 2017-10-09 DIAGNOSIS — T829XXA Unspecified complication of cardiac and vascular prosthetic device, implant and graft, initial encounter: Secondary | ICD-10-CM

## 2017-10-09 DIAGNOSIS — I9589 Other hypotension: Secondary | ICD-10-CM

## 2017-10-09 LAB — RENAL FUNCTION PANEL
Albumin: 3.2 g/dL — ABNORMAL LOW (ref 3.5–5.0)
Anion gap: 19 — ABNORMAL HIGH (ref 5–15)
BUN: 27 mg/dL — AB (ref 6–20)
CALCIUM: 9.4 mg/dL (ref 8.9–10.3)
CHLORIDE: 96 mmol/L — AB (ref 98–111)
CO2: 22 mmol/L (ref 22–32)
CREATININE: 8.07 mg/dL — AB (ref 0.61–1.24)
GFR calc Af Amer: 8 mL/min — ABNORMAL LOW (ref >60)
GFR calc non Af Amer: 7 mL/min — ABNORMAL LOW (ref >60)
GLUCOSE: 99 mg/dL (ref 70–99)
Phosphorus: 5.3 mg/dL — ABNORMAL HIGH (ref 2.5–4.6)
Potassium: 4.3 mmol/L (ref 3.5–5.1)
SODIUM: 137 mmol/L (ref 135–145)

## 2017-10-09 LAB — CBC
HCT: 40.4 % (ref 39.0–52.0)
HEMOGLOBIN: 12.4 g/dL — AB (ref 13.0–17.0)
MCH: 27.6 pg (ref 26.0–34.0)
MCHC: 30.7 g/dL (ref 30.0–36.0)
MCV: 89.8 fL (ref 78.0–100.0)
PLATELETS: 379 10*3/uL (ref 150–400)
RBC: 4.5 MIL/uL (ref 4.22–5.81)
RDW: 25.2 % — AB (ref 11.5–15.5)
WBC: 10.3 10*3/uL (ref 4.0–10.5)

## 2017-10-09 LAB — PROTIME-INR
INR: 2.66
PROTHROMBIN TIME: 28.1 s — AB (ref 11.4–15.2)

## 2017-10-09 LAB — HEPARIN LEVEL (UNFRACTIONATED): HEPARIN UNFRACTIONATED: 0.36 [IU]/mL (ref 0.30–0.70)

## 2017-10-09 MED ORDER — HEPARIN SODIUM (PORCINE) 1000 UNIT/ML IJ SOLN
INTRAMUSCULAR | Status: AC
  Administered 2017-10-09: 1900 [IU] via INTRAVENOUS
  Filled 2017-10-09: qty 4

## 2017-10-09 MED ORDER — MIDODRINE HCL 5 MG PO TABS
ORAL_TABLET | ORAL | Status: AC
  Filled 2017-10-09: qty 3

## 2017-10-09 MED ORDER — WARFARIN SODIUM 3 MG PO TABS: 3.0000 mg | ORAL_TABLET | Freq: Every day | ORAL | 0 refills | Status: AC

## 2017-10-09 MED ORDER — VANCOMYCIN HCL IN DEXTROSE 750-5 MG/150ML-% IV SOLN
INTRAVENOUS | Status: AC
  Administered 2017-10-09: 750 mg via INTRAVENOUS
  Filled 2017-10-09: qty 150

## 2017-10-09 MED ORDER — ACETAMINOPHEN 325 MG PO TABS
ORAL_TABLET | ORAL | Status: AC
  Filled 2017-10-09: qty 2

## 2017-10-09 MED ORDER — WARFARIN SODIUM 3 MG PO TABS
3.0000 mg | ORAL_TABLET | Freq: Once | ORAL | Status: AC
  Administered 2017-10-09: 3 mg via ORAL
  Filled 2017-10-09: qty 1

## 2017-10-09 NOTE — Progress Notes (Signed)
ANTICOAGULATION CONSULT NOTE - Follow Up Consult  Pharmacy Consult for Warfarin Indication: history of CVA and DVT  Allergies  Allergen Reactions  . Depakote [Divalproex Sodium] Other (See Comments)    Hallucinations   . Hydromorphone Other (See Comments)    Patient Measurements: Height: 5\' 6"  (167.6 cm) Weight: 167 lb 8.8 oz (76 kg) IBW/kg (Calculated) : 63.8  Vital Signs: Temp: 97.6 F (36.4 C) (09/23 0720) Temp Source: Oral (09/23 0720) BP: 106/32 (09/23 0845) Pulse Rate: 76 (09/23 0845)  Labs: Recent Labs    10/07/17 0548 10/07/17 1857 10/08/17 0459 10/08/17 0752 10/09/17 0511 10/09/17 0512 10/09/17 0734  HGB 12.2*  --   --  12.6*  --  12.4*  --   HCT 42.5  --   --  41.9  --  40.4  --   PLT 392  --   --  347  --  379  --   LABPROT 18.3*  --  22.4*  --   --  28.1*  --   INR 1.54  --  1.99  --   --  2.66  --   HEPARINUNFRC 0.25* 0.28* 0.30  --  0.36  --   --   CREATININE  --   --   --   --   --   --  8.07*    Estimated Creatinine Clearance: 10.3 mL/min (A) (by C-G formula based on SCr of 8.07 mg/dL (H)).   Medications:  Scheduled:  . aspirin  81 mg Oral Daily  . Chlorhexidine Gluconate Cloth  6 each Topical Q0600  . cinacalcet  60 mg Oral QHS  . escitalopram  20 mg Oral QHS  . midodrine  15 mg Oral TID  . multivitamin  1 tablet Oral Daily  . multivitamin  1 tablet Oral QHS  . pantoprazole  20 mg Oral Daily  . vancomycin variable dose per unstable renal function (pharmacist dosing)   Does not apply See admin instructions  . vitamin A  10,000 Units Oral Daily  . ascorbic acid  250 mg Oral BID  . Vitamin D (Ergocalciferol)  50,000 Units Oral Q7 days  . 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 NOT surgical candidate per CVTS. Prior to admission warfarin dose =  3mg /day  9/23: Patient therapeutic on warfarin this morning with INR at 2.66, previously 1.99 yesterday. Will dose warfarin slightly lower. Heparin stopped. Nurse confirmed no s/sx of bleeding.   Goal of Therapy:  INR 2-3 Monitor platelets by anticoagulation protocol: Yes   Plan:  Discontinue heparin order Warfarin 3mg  PO x1 tonight Daily INR, CBC Monitor for s/sx bleeding  Thank you for allowing pharmacy to be a part of this patient's care.  Bradley FerrisJoshua Tessin, PharmD 10/09/2017 8:55 AM PGY-1 Pharmacy Resident Direct Phone: [phone] Please check [email or web address] for unit-specific pharmacist phone numbers

## 2017-10-09 NOTE — Progress Notes (Signed)
Called Mother regarding discharge. Will call [facility] for transport.

## 2017-10-09 NOTE — Progress Notes (Addendum)
Pharmacy Antibiotic Note  Perry [name] is a 47 y.o. male admitted on 09/27/2017 with bacteremia.  Pharmacy has been consulted for vancomycin dosing.  [facility]. Known HD patient with hx of bacteremia in April (underwent catheter exchange/treatment with vanc). Presented with SOB and fevers - BCx grew MRSA. TEE noting large vegetation at base of anterior mitral leaflet with suspected abscess. Will need 8 weeks of IV vanc per ID  [facility]/14 and again 9/17.  Patient tolerated HD 9/18, 9/20 and received vanc 750mg  IV both days. Estimated vancomycin level prior to today's HD is ~23.5768mcg/mL.  Tolerated full HD session today.  Plan: Continue vancomycin 750mg  IV qMWF with hemodialysis for 8 weeks Vancomycin completion date of 11/17/17 Monitor clinical improvement  Height: 5\' 6"  (167.6 cm) Weight: 167 lb 8.8 oz (76 kg) IBW/kg (Calculated) : 63.8  Temp (24hrs), Avg:97.7 F (36.5 C), Min:97.5 F (36.4 C), Max:98 F (36.7 C)  Recent Labs  Lab 10/02/17 1931  10/04/17 1100 10/05/17 0416 10/06/17 0500 10/06/17 0757 10/07/17 0548 10/08/17 0752 10/09/17 0512 10/09/17 0734  WBC  --    < >  --  8.7 7.9  --  8.1 11.0* 10.3  --   CREATININE  --   --  9.77*  --   --  7.02*  --   --   --  8.07*  VANCORANDOM 21  --   --   --   --   --   --   --   --   --    < > = values in this interval not displayed.    Estimated Creatinine Clearance: 10.3 mL/min (A) (by C-G formula based on SCr of 8.07 mg/dL (H)).    Allergies  Allergen Reactions  . Depakote [Divalproex Sodium] Other (See Comments)    Hallucinations   . Hydromorphone Other (See Comments)    Antimicrobials this admission: 9/14 Cefazoline >> 9/14 9/18 Vancomycin >> (11/1)  Dose adjustments this admission: VR 9/11: 39 >> hold maintenance dosing VR 9/13: 35 >> hold maintenance dosing VR 9/16: 21 >> 750mg  qHD  Microbiology results: 9/5 BCx: MRSA 9/6 Bcx: negative 9/6 MRSA  PCR: POSITIVE 9/11 Cath tip culture >> MRSA 9/12 BCx >> negative  Thank you for allowing pharmacy to be a part of this patient's care.  Bradley FerrisJoshua Tessin, PharmD 10/09/2017 9:08 AM PGY-1 Pharmacy Resident Direct Phone: [phone] Please check [email or web address] for unit-specific pharmacist phone numbers

## 2017-10-09 NOTE — Progress Notes (Signed)
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 Patient had a new HD catheter placed 09/30/17 - not thought to be a surgical candidate per cardiothoracic vascular surgery [facility]   Objective:  Vital signs in last 24 hours:  Temp:  [97.6 F (36.4 C)-98 F (36.7 C)] 97.6 F (36.4 C) (09/23 0720) Pulse Rate:  [72-90] 72 (09/23 0930) Resp:  [15-23] 23 (09/23 0735) BP: (97-119)/(31-39) 97/33 (09/23 0930) SpO2:  [93 %-100 %] 100 % (09/23 0720) Weight:  [76 kg] 76 kg (09/23 0720)  Weight change:  Filed Weights   10/06/17 1148 10/08/17 0700 10/09/17 0720  Weight: 76.2 kg 74.4 kg 76 kg    Intake/Output: I/O last 3 completed shifts: In: 765.1 [P.O.:320; I.V.:445.1] Out: 0    Intake/Output this shift:  No intake/output data recorded.  CVS- RRR RS- CTA ABD- BS present soft non-distended EXT- no edema   Basic Metabolic Panel: Recent Labs  Lab 10/04/17 1100 10/06/17 0757 10/09/17 0734  NA 138 139 137  K 3.4* 3.6 4.3  CL 97* 97* 96*  CO2 24 25 22   GLUCOSE 92 99 99  BUN 39* 22* 27*  CREATININE 9.77* 7.02* 8.07*  CALCIUM 7.9* 8.7* 9.4  PHOS 4.2 3.6 5.3*    Liver Function Tests: Recent Labs  Lab 10/04/17 1100 10/06/17 0757 10/09/17 0734  ALBUMIN 2.7* 3.1* 3.2*   No results for input(s): LIPASE, AMYLASE in the last 168 hours. No results for input(s): AMMONIA in the last 168 hours.  CBC: Recent Labs  Lab 10/05/17 0416 10/06/17 0500 10/07/17 0548 10/08/17 0752 10/09/17 0512  WBC 8.7 7.9 8.1 11.0* 10.3  HGB 11.3* 11.5* 12.2* 12.6* 12.4*  HCT 38.3* 39.9 42.5 41.9 40.4  MCV 90.1 92.6 91.8 90.7 89.8  PLT 388 413* 392 347 379    Cardiac Enzymes: No results for input(s): CKTOTAL, CKMB, CKMBINDEX, TROPONINI in the last  168 hours.  BNP: Invalid input(s): POCBNP  CBG: Recent Labs  Lab 10/06/17 2114  GLUCAP 136*    Microbiology: Results for orders placed or performed during the hospital encounter of 09/27/17  Culture, blood (Routine X 2) w Reflex to ID Panel     Status: None   Collection Time: 09/28/17  7:00 AM  Result Value Ref Range Status   Specimen Description BLOOD RIGHT ARM  Final   Special Requests   Final    BOTTLES DRAWN AEROBIC ONLY Blood Culture results may not be optimal due to an inadequate volume of blood received in culture bottles   Culture   Final    NO GROWTH 5 DAYS Performed at [facility], [address] [place], [place] [address]    Report Status 10/03/2017 FINAL  Final  Culture, blood (Routine X 2) w Reflex to ID Panel     Status: None   Collection Time: 09/28/17  7:10 AM  Result Value Ref Range Status   Specimen Description BLOOD RIGHT HAND  Final   Special Requests   Final    BOTTLES DRAWN AEROBIC ONLY Blood Culture results may not be optimal due to an inadequate volume of blood received in culture bottles   Culture   Final    NO GROWTH 5 DAYS Performed at [facility], [address]  [place], [place] [address]    Report Status 10/03/2017 FINAL  Final    Coagulation Studies: Recent Labs    10/07/17 0548 10/08/17 0459 10/09/17 0512  LABPROT 18.3* 22.4* 28.1*  INR 1.54 1.99 2.66    Urinalysis: No results for input(s): COLORURINE, LABSPEC, PHURINE, GLUCOSEU, HGBUR, BILIRUBINUR, KETONESUR, PROTEINUR, UROBILINOGEN, NITRITE, LEUKOCYTESUR in the last 72 hours.  Invalid input(s): APPERANCEUR    Imaging: No results found.   Medications:   . vancomycin     . aspirin  81 mg Oral Daily  . Chlorhexidine Gluconate Cloth  6 each Topical Q0600  . cinacalcet  60 mg Oral QHS  . escitalopram  20 mg Oral QHS  . midodrine  15 mg Oral TID  . multivitamin  1 tablet Oral Daily  . multivitamin  1 tablet Oral QHS  . pantoprazole  20 mg Oral Daily  .  vancomycin variable dose per unstable renal function (pharmacist dosing)   Does not apply See admin instructions  . vitamin A  10,000 Units Oral Daily  . ascorbic acid  250 mg Oral BID  . Vitamin D (Ergocalciferol)  50,000 Units Oral Q7 days  . warfarin  3 mg Oral ONCE-1800  . Warfarin - Pharmacist Dosing Inpatient   Does not apply q1800   acetaminophen, chlorhexidine, heparin, lidocaine, RESOURCE THICKENUP CLEAR  Assessment/ Plan:   MRSA bacteremia/ endocarditits/Abscess  Treated with 8 weeks vancomycin and followed by ID   [facility]  Removed 09/27/17 new HD [facility]  TDC placed 09/30/17  Severe AR with MRSA endocarditis   Not surgical candidate. palliative care consulted  ESRD  Continues with MWF dialysis   Lying in hospital bed   Hypotension will follow  Patient taking midodrine  Vascular access  S/p exchange on 09/30/17  History of CVA and left hemiparesis  Seen on dialysis with no complaints and no emerging  issues     LOS: 12 Christia Domke W @TODAY @9 :45 AM

## 2017-10-09 NOTE — Discharge Summary (Signed)
Discharge Summary  ABHI MOCCIA IEP:[id number] DOB: 07-16-1970  PCP: [facility] [place] date: 09/27/2017 Discharge date: 10/09/2017   Time spent: < 25 minutes  Admitted From: home Disposition:  home  Recommendations for Outpatient Follow-up:  1. Follow up with PCP in 1-2 weeks 2. Continue Vancomycin dosing at HD, end date 11/17/17 3. INR check    Discharge Diagnoses:  Active Hospital Problems   Diagnosis Date Noted  . Acute bacterial endocarditis   . Chronic hypotension 10/08/2017  . MRSA bacteremia   . H/O: stroke   . Goals of care, counseling/discussion   . ESRD (end stage renal disease) on dialysis [facility]) 06/06/2012    Resolved Hospital Problems   Diagnosis Date Noted Date Resolved  . Bacteremia 09/27/2017 09/28/2017    Discharge Condition: Stable   CODE STATUS:FULL   Diet recommendation:     History of present illness/Hospital Course:  GAYLON BENTZ is a 47 y.o. year old male with medical history significant for history of stroke which has left him bedridden and totally dependent on all ADLs, ESRD on HD, HTN, previous history of MRSA bacteremia treated with 4 weeks of vancomycin on 04/2017 who initially presented to [facility] health at [facility] on 9/5-9/11  with fevers and SOB treated empirically for possible PNA but was found to have MRSA bacteremia and treated with vancomycin.  His HD catheter was removed and  TEE done demonstrated a large mitral vegetation with concern for associated abscess prompting transfer to [facility] for cardio\thoracic surgery evaluation on 09/27/17.    Hospital Course:   MRSA endocarditis with abscess and bacteremia. Due to his poor functional status (recent CVA with left-sided weakness, dependent on all ADLs) he was deemed to not be a surgical candidate, additionally patient did not wish to have risky surgery.  He has remained afebrile since admission with no new signs of ongoing bacteremia.  ID recommended  continuing course of 8 weeks of IV vancomycin post HD, with end date November 17, 2017.  With plan to follow-up with Dr. Sharia Reeve in [place] in 6 to 8 weeks to discuss possible chronic antibiotic suppression.  Patient had new HD catheter placed on 9/14.  ESRD on HD, MWF.  Initially some concern patient will not be able to tolerate UF sessions due to some intermittent hypotension.  However patient did well with consecutive sessions and will continue as an outpatient.  Chronic hypotension, asymptomatic.  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  Interactive on exam in terms of conversation though some difficulty finding words.  Will on heparin bridge while INR was subtherapeutic, on discharge return back to home Coumadin regimen.   Consultations:  Nephrology, CT surgery, ID  Procedures/Studies:  9/11- TEE, large vegetation or anterior mitral leaflet, septal perforation tracking of the aortic valve, concerning for abscess, severe aortic valve regurgitation and severe MR  9/9- TEE no evidence of vegetation  Discharge Exam: BP (!) 98/36 (BP Location: Right Arm)   Pulse 88   Temp 97.6 F (36.4 C) (Oral)   Resp 18   Ht 5' 6"  (1.676 m)   Wt 73 kg   SpO2 100%   BMI 25.98 kg/m   Constitutional:male lying in bed, no distress Eyes: EOMI, anicteric, normal conjunctivae ENMT: Oropharynx with moist mucous membranes, normal dentition Cardiovascular: RRR with  Murmur heard in LUSB, with no peripheral edema Respiratory: Normal respiratory effort on room air, good air movement Neurologic: left hemiparesis. No new deficits  Psychiatric:flat affect, appropriate mood. Mental status AAOx3  Discharge Instructions You were cared for by a hospitalist during your hospital stay. If you have any questions about your discharge medications or the care you received while you were in the hospital after you are discharged, you can call the unit and asked to  speak with the hospitalist on call if the hospitalist that took care of you is not available. Once you are discharged, your primary care physician will handle any further medical issues. Please note that NO REFILLS for any discharge medications will be authorized once you are discharged, as it is imperative that you return to your primary care physician (or establish a relationship with a primary care physician if you do not have one) for your aftercare needs so that they can reassess your need for medications and monitor your lab values.  Discharge Instructions    Diet - low sodium heart healthy   Complete by:  As directed    Increase activity slowly   Complete by:  As directed      Allergies as of 10/09/2017      Reactions   Depakote [divalproex Sodium] Other (See Comments)   Hallucinations   Hydromorphone Other (See Comments)      Medication List    TAKE these medications   acetaminophen 325 MG tablet Commonly known as:  TYLENOL Take 650 mg by mouth every 6 (six) hours as needed for mild pain or moderate pain.   alprazolam 2 MG tablet Commonly known as:  XANAX Take 1 tablet by mouth at bedtime as needed for sleep.   ascorbic acid 250 MG tablet Commonly known as:  VITAMIN C Take 1 tablet (250 mg total) by mouth 2 (two) times daily.   aspirin 81 MG chewable tablet Chew 1 tablet (81 mg total) by mouth daily.   cinacalcet 60 MG tablet Commonly known as:  SENSIPAR Take 1 tablet (60 mg total) by mouth at bedtime.   escitalopram 20 MG tablet Commonly known as:  LEXAPRO Take 20 mg by mouth at bedtime.   gabapentin 300 MG capsule Commonly known as:  NEURONTIN Take 300 mg by mouth at bedtime as needed.   lidocaine-prilocaine cream Commonly known as:  EMLA Apply 1 application topically as needed. What changed:  reasons to take this   midodrine 10 MG tablet Commonly known as:  PROAMATINE Take 1.5 tablets (15 mg total) by mouth every 8 (eight) hours.   multivitamin Tabs  tablet Take 1 tablet by mouth at bedtime.   multivitamin with minerals Tabs tablet Take 1 tablet by mouth daily.   multivitamin-lutein Caps capsule Take 1 capsule by mouth daily.   pantoprazole 20 MG tablet Commonly known as:  PROTONIX Take 1 tablet (20 mg total) by mouth daily.   polyethylene glycol packet Commonly known as:  MIRALAX / GLYCOLAX Take 17 g by mouth daily as needed for mild constipation.   RENVELA 2.4 g Pack Generic drug:  sevelamer carbonate Take 2.4 g by mouth 3 (three) times daily with meals.   senna-docusate 8.6-50 MG tablet Commonly known as:  Senokot-S Take 2 tablets by mouth 2 (two) times daily.   vitamin A 10000 UNIT capsule Take 1 capsule (10,000 Units total) by mouth daily.   Vitamin D (Ergocalciferol) 50000 units Caps capsule Commonly known as:  DRISDOL Take 1 capsule (50,000 Units total) by mouth every 7 (seven) days.   warfarin 3 MG tablet Commonly known as:  COUMADIN Take 1 tablet (3 mg total) by  mouth daily. What changed:    medication strength  how much to take      Allergies  Allergen Reactions  . Depakote [Divalproex Sodium] Other (See Comments)    Hallucinations   . Hydromorphone Other (See Comments)   Follow-up Information    [facility] [place] Follow up.   Why:  Someone from [facility] will contact you to arrange start date and time for resumption of care.  Contact information: [place] [address] [phone]            The results of significant diagnostics from this hospitalization (including imaging, microbiology, ancillary and laboratory) are listed below for reference.    Significant Diagnostic Studies: Ir Fluoro Guide Cv Line Left  Result Date: 10/03/2017 INDICATION: 47 year old male with a history of renal failure. Recently placed left IJ central venous catheter has had difficulty aspirating. EXAM: IMAGE GUIDED EXCHANGE OF LEFT IJ TUNNELED HEMODIALYSIS CATHETER MEDICATIONS: 2.0  g Ancef. The antibiotic was given in an appropriate time interval prior to skin puncture. ANESTHESIA/SEDATION: No moderate sedation FLUOROSCOPY TIME:  Fluoroscopy Time: 0 minutes 12 seconds (11 mGy). COMPLICATIONS: None PROCEDURE: Informed written consent was obtained from the patient after a discussion of the risks, benefits, and alternatives to treatment. Questions regarding the procedure were encouraged and answered. The left neck and chest and the indwelling catheter were prepped with chlorhexidine in a sterile fashion, and a sterile drape was applied covering the operative field. Maximum barrier sterile technique with sterile gowns and gloves were used for the procedure. A timeout was performed prior to the initiation of the procedure. Initial images were recorded. The blue and brown hubs of the catheter were aspirated vigorously. There was some sputtering at the blue hub of the catheter. Contrast was injected gently through the catheter, which suggested possibility of some narrowing at the cavoatrial junction. Modified Seldinger technique was then used to exchange the new 23 cm tip to cuff catheter over a stiff Glidewire, with slight further advancement of the catheter into the upper right atrium. The catheter was then aspirated vigorously confirming excellent flow at both brown and blue ports. Hep-Lock was infused. The catheter exit site was secured with a 0-Prolene retention suture. Dressings were applied. The patient tolerated the procedure well without immediate post procedural complication. IMPRESSION: Status post exchange of left IJ tunneled hemodialysis catheter with placement of a new 23 cm tip to cuff catheter. Signed, Dulcy Fanny. Dellia Nims, RPVI [facility] Radiology Electronically Signed   By: Corrie Mckusick D.O.   On: 10/03/2017 17:13   Ir Fluoro Guide Cv Line Left  Result Date: 09/30/2017 INDICATION: End-stage renal disease. In need of durable  intravenous access for the continuation of dialysis. EXAM: TUNNELED CENTRAL VENOUS HEMODIALYSIS CATHETER PLACEMENT WITH ULTRASOUND AND FLUOROSCOPIC GUIDANCE MEDICATIONS: Ancef 2 gm IV . The antibiotic was given in an appropriate time interval prior to skin puncture. ANESTHESIA/SEDATION: Versed 1 mg IV; Fentanyl 50 mcg IV; Moderate Sedation Time:  17 minutes The patient was continuously monitored during the procedure by the interventional radiology nurse under my direct supervision. FLUOROSCOPY TIME:  1 minute, 18 seconds (30 mGy) COMPLICATIONS: None immediate. PROCEDURE: Informed written consent was obtained from the patient after a discussion of the risks, benefits, and alternatives to treatment. Questions regarding the procedure were encouraged and answered. Sonographic evaluation was performed of the right neck and failed to delineate a patent right internal jugular vein. Sonographic evaluation was performed of the left neck  and demonstrated a partially occluded left internal jugular vein. As such, decision was made to attempt placement of a left internal jugular approach dialysis catheter. The left neck and chest were prepped with chlorhexidine in a sterile fashion, and a sterile drape was applied covering the operative field. Maximum barrier sterile technique with sterile gowns and gloves were used for the procedure. A timeout was performed prior to the initiation of the procedure. After creating a small venotomy incision, a micropuncture kit was utilized to access the left internal jugular vein. Real-time ultrasound guidance was utilized for vascular access including the acquisition of a permanent ultrasound image documenting patency of the accessed vessel. The microwire was utilized to measure appropriate catheter length. A stiff Glidewire was advanced to the level of the IVC and the micropuncture sheath was exchanged for a peel-away sheath. A palindrome tunneled hemodialysis catheter measuring 23 cm from tip  to cuff was tunneled in a retrograde fashion from the anterior chest wall to the venotomy incision. The catheter was then placed through the peel-away sheath with tips ultimately positioned within the superior aspect of the right atrium. Final catheter positioning was confirmed and documented with a spot radiographic image. The catheter aspirates and flushes normally. The catheter was flushed with appropriate volume heparin dwells. The catheter exit site was secured with a 0-Prolene retention suture. The venotomy incision was closed with Dermabond and Steri-strips. Dressings were applied. The patient tolerated the procedure well without immediate post procedural complication. IMPRESSION: Successful placement of 23 cm tip to cuff tunneled hemodialysis catheter via the left internal jugular vein with tips terminating within the superior aspect of the right atrium. The catheter is ready for immediate use. 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 Electronically Signed   By: Sandi Mariscal M.D.   On: 09/30/2017 18:01   Ir US Guide Vasc Access Left  Result Date: 09/30/2017 INDICATION: End-stage renal disease. In need of durable intravenous access for the continuation of dialysis. EXAM: TUNNELED CENTRAL VENOUS HEMODIALYSIS CATHETER PLACEMENT WITH ULTRASOUND AND FLUOROSCOPIC GUIDANCE MEDICATIONS: Ancef 2 gm IV . The antibiotic was given in an appropriate time interval prior to skin puncture. ANESTHESIA/SEDATION: Versed 1 mg IV; Fentanyl 50 mcg IV; Moderate Sedation Time:  17 minutes The patient was continuously monitored during the procedure by the interventional radiology nurse under my direct supervision. FLUOROSCOPY TIME:  1 minute, 18 seconds (30 mGy) COMPLICATIONS: None immediate. PROCEDURE: Informed written consent was obtained from the patient  after a discussion of the risks, benefits, and alternatives to treatment. Questions regarding the procedure were encouraged and answered. Sonographic evaluation was performed of the right neck and failed to delineate a patent right internal jugular vein. Sonographic evaluation was performed of the left neck and demonstrated a partially occluded left internal jugular vein. As such, decision was made to attempt placement of a left internal jugular approach dialysis catheter. The left neck and chest were prepped with chlorhexidine in a sterile fashion, and a sterile drape was applied covering the operative field. Maximum barrier sterile technique with sterile gowns and gloves were used for the procedure. A timeout was performed prior to the initiation of the procedure. After creating a small venotomy incision, a micropuncture kit was utilized to access the left internal jugular vein. Real-time ultrasound guidance was utilized for vascular access including the acquisition of a permanent ultrasound image  documenting patency of the accessed vessel. The microwire was utilized to measure appropriate catheter length. A stiff Glidewire was advanced to the level of the IVC and the micropuncture sheath was exchanged for a peel-away sheath. A palindrome tunneled hemodialysis catheter measuring 23 cm from tip to cuff was tunneled in a retrograde fashion from the anterior chest wall to the venotomy incision. The catheter was then placed through the peel-away sheath with tips ultimately positioned within the superior aspect of the right atrium. Final catheter positioning was confirmed and documented with a spot radiographic image. The catheter aspirates and flushes normally. The catheter was flushed with appropriate volume heparin dwells. The catheter exit site was secured with a 0-Prolene retention suture. The venotomy incision was closed with Dermabond and Steri-strips. Dressings were applied. The patient tolerated the procedure  well without immediate post procedural complication. IMPRESSION: Successful placement of 23 cm tip to cuff tunneled hemodialysis catheter via the left internal jugular vein with tips terminating within the superior aspect of the right atrium. The catheter is ready for immediate use. 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 Electronically Signed   By: Sandi Mariscal M.D.   On: 09/30/2017 18:01   Dg Chest Portable 1 View  Result Date: 09/21/2017 CLINICAL DATA:  Fever and chronic cough. EXAM: PORTABLE CHEST 1 VIEW COMPARISON:  August 21, 2017 FINDINGS: Dual lead left central venous line is unchanged. There is pulmonary edema, right greater than left. There are bilateral pleural effusions. No definite focal consolidation is identified. The bony structures are stable. IMPRESSION: Pulmonary edema.  Bilateral pleural effusions. Electronically Signed   By: Abelardo Diesel M.D.   On: 09/21/2017 18:29    Microbiology: No results found for this or any previous visit (from the past 240 hour(s)).   Labs: Basic Metabolic Panel: Recent Labs  Lab 10/09/17 0734  NA 137  K 4.3  CL 96*  CO2 22  GLUCOSE 99  BUN 27*  CREATININE 8.07*  CALCIUM 9.4  PHOS 5.3*   Liver Function Tests: Recent Labs  Lab 10/09/17 0734  ALBUMIN 3.2*   No results for input(s): LIPASE, AMYLASE in the last 168 hours. No results for input(s): AMMONIA in the last 168 hours. CBC: Recent Labs  Lab 10/08/17 0752 10/09/17 0512  WBC 11.0* 10.3  HGB 12.6* 12.4*  HCT 41.9 40.4  MCV 90.7 89.8  PLT 347 379   Cardiac Enzymes: No results for input(s): CKTOTAL, CKMB, CKMBINDEX, TROPONINI in the last 168 hours. BNP: BNP (last 3 results) Recent Labs    08/18/17 0138  BNP >4,500.0*    ProBNP (last 3 results) No results for input(s): PROBNP in the last 8760  hours.  CBG: No results for input(s): GLUCAP in the last 168 hours.     Signed:  Desiree Hane, MD [facility] 10/14/2017, 10:45 PM

## 2017-10-09 NOTE — Care Management Note (Signed)
Case Management Note  Patient Details  Name: Perry [name] MRN: [id number] Date of Birth: 1970-02-01  Subjective/Objective:  47 yr old male Was admitted for MRSA endocarditis with abscess and bacteremia and treated with vancomycin. Patient had a new HD catheter placed 09/30/17 -                Action/Plan: Case manager spoke with patient's mom, [name], CM called to confirm, services will resume on 10/10/17.  CM has requested MD place resumption of [name]. Case manager provided bedside RN with PTAR number to arrange transport when MD has entered discharge orders.    Expected Discharge Date:   10/09/17               Expected Discharge Plan:  Home w Home Health Services  In-House Referral:     Discharge planning Services  CM Consult  Post Acute Care Choice:  NA Choice offered to:  Parent(CM spoke with Sherren KernsMargaret Fuller, patient's mom)  DME Arranged:  (S) (has All) DME Agency:     HH Arranged:    HH [facility]  Status of Service:  Completed, signed off  If discussed at Long Length of Stay Meetings, dates discussed:    Additional Comments:  Durenda GuthrieBrady, [name], RN 10/09/2017, 4:33 PM

## 2017-11-20 ENCOUNTER — Encounter (HOSPITAL_COMMUNITY): Payer: Self-pay

## 2017-11-27 ENCOUNTER — Emergency Department: Payer: Medicare Other

## 2017-11-27 ENCOUNTER — Encounter: Payer: Self-pay | Admitting: Internal Medicine

## 2017-11-27 ENCOUNTER — Inpatient Hospital Stay
Admission: EM | Admit: 2017-11-27 | Discharge: 2017-12-17 | DRG: 291 | Disposition: E | Payer: Medicare Other | Attending: Internal Medicine | Admitting: Internal Medicine

## 2017-11-27 DIAGNOSIS — I330 Acute and subacute infective endocarditis: Secondary | ICD-10-CM | POA: Diagnosis present

## 2017-11-27 DIAGNOSIS — I472 Ventricular tachycardia: Secondary | ICD-10-CM | POA: Diagnosis not present

## 2017-11-27 DIAGNOSIS — R0602 Shortness of breath: Secondary | ICD-10-CM | POA: Diagnosis present

## 2017-11-27 DIAGNOSIS — R05 Cough: Secondary | ICD-10-CM | POA: Diagnosis not present

## 2017-11-27 DIAGNOSIS — J90 Pleural effusion, not elsewhere classified: Secondary | ICD-10-CM | POA: Diagnosis not present

## 2017-11-27 DIAGNOSIS — Z7401 Bed confinement status: Secondary | ICD-10-CM

## 2017-11-27 DIAGNOSIS — I120 Hypertensive chronic kidney disease with stage 5 chronic kidney disease or end stage renal disease: Secondary | ICD-10-CM | POA: Diagnosis not present

## 2017-11-27 DIAGNOSIS — G4733 Obstructive sleep apnea (adult) (pediatric): Secondary | ICD-10-CM | POA: Diagnosis present

## 2017-11-27 DIAGNOSIS — R4189 Other symptoms and signs involving cognitive functions and awareness: Secondary | ICD-10-CM | POA: Diagnosis not present

## 2017-11-27 DIAGNOSIS — E669 Obesity, unspecified: Secondary | ICD-10-CM | POA: Diagnosis present

## 2017-11-27 DIAGNOSIS — Z515 Encounter for palliative care: Secondary | ICD-10-CM | POA: Diagnosis not present

## 2017-11-27 DIAGNOSIS — Z8249 Family history of ischemic heart disease and other diseases of the circulatory system: Secondary | ICD-10-CM

## 2017-11-27 DIAGNOSIS — I132 Hypertensive heart and chronic kidney disease with heart failure and with stage 5 chronic kidney disease, or end stage renal disease: Secondary | ICD-10-CM | POA: Diagnosis present

## 2017-11-27 DIAGNOSIS — Z9884 Bariatric surgery status: Secondary | ICD-10-CM | POA: Diagnosis not present

## 2017-11-27 DIAGNOSIS — I361 Nonrheumatic tricuspid (valve) insufficiency: Secondary | ICD-10-CM | POA: Diagnosis not present

## 2017-11-27 DIAGNOSIS — R1310 Dysphagia, unspecified: Secondary | ICD-10-CM | POA: Diagnosis present

## 2017-11-27 DIAGNOSIS — I248 Other forms of acute ischemic heart disease: Secondary | ICD-10-CM | POA: Diagnosis not present

## 2017-11-27 DIAGNOSIS — F419 Anxiety disorder, unspecified: Secondary | ICD-10-CM | POA: Diagnosis present

## 2017-11-27 DIAGNOSIS — M549 Dorsalgia, unspecified: Secondary | ICD-10-CM | POA: Diagnosis present

## 2017-11-27 DIAGNOSIS — Z66 Do not resuscitate: Secondary | ICD-10-CM | POA: Diagnosis present

## 2017-11-27 DIAGNOSIS — Z8679 Personal history of other diseases of the circulatory system: Secondary | ICD-10-CM

## 2017-11-27 DIAGNOSIS — K219 Gastro-esophageal reflux disease without esophagitis: Secondary | ICD-10-CM | POA: Diagnosis present

## 2017-11-27 DIAGNOSIS — R011 Cardiac murmur, unspecified: Secondary | ICD-10-CM | POA: Diagnosis not present

## 2017-11-27 DIAGNOSIS — A419 Sepsis, unspecified organism: Secondary | ICD-10-CM | POA: Diagnosis not present

## 2017-11-27 DIAGNOSIS — Z7189 Other specified counseling: Secondary | ICD-10-CM | POA: Diagnosis not present

## 2017-11-27 DIAGNOSIS — I9589 Other hypotension: Secondary | ICD-10-CM | POA: Diagnosis present

## 2017-11-27 DIAGNOSIS — R609 Edema, unspecified: Secondary | ICD-10-CM

## 2017-11-27 DIAGNOSIS — I69354 Hemiplegia and hemiparesis following cerebral infarction affecting left non-dominant side: Secondary | ICD-10-CM

## 2017-11-27 DIAGNOSIS — N2581 Secondary hyperparathyroidism of renal origin: Secondary | ICD-10-CM | POA: Diagnosis present

## 2017-11-27 DIAGNOSIS — J9601 Acute respiratory failure with hypoxia: Secondary | ICD-10-CM

## 2017-11-27 DIAGNOSIS — Z8614 Personal history of Methicillin resistant Staphylococcus aureus infection: Secondary | ICD-10-CM | POA: Diagnosis not present

## 2017-11-27 DIAGNOSIS — Z9889 Other specified postprocedural states: Secondary | ICD-10-CM

## 2017-11-27 DIAGNOSIS — R7989 Other specified abnormal findings of blood chemistry: Secondary | ICD-10-CM | POA: Diagnosis not present

## 2017-11-27 DIAGNOSIS — D6862 Lupus anticoagulant syndrome: Secondary | ICD-10-CM | POA: Diagnosis present

## 2017-11-27 DIAGNOSIS — G894 Chronic pain syndrome: Secondary | ICD-10-CM | POA: Diagnosis present

## 2017-11-27 DIAGNOSIS — R0600 Dyspnea, unspecified: Secondary | ICD-10-CM | POA: Diagnosis not present

## 2017-11-27 DIAGNOSIS — Y712 Prosthetic and other implants, materials and accessory cardiovascular devices associated with adverse incidents: Secondary | ICD-10-CM | POA: Diagnosis present

## 2017-11-27 DIAGNOSIS — Z888 Allergy status to other drugs, medicaments and biological substances status: Secondary | ICD-10-CM

## 2017-11-27 DIAGNOSIS — I82C22 Chronic embolism and thrombosis of left internal jugular vein: Secondary | ICD-10-CM | POA: Diagnosis present

## 2017-11-27 DIAGNOSIS — Z8701 Personal history of pneumonia (recurrent): Secondary | ICD-10-CM | POA: Diagnosis not present

## 2017-11-27 DIAGNOSIS — E872 Acidosis: Secondary | ICD-10-CM | POA: Diagnosis not present

## 2017-11-27 DIAGNOSIS — I080 Rheumatic disorders of both mitral and aortic valves: Secondary | ICD-10-CM | POA: Diagnosis present

## 2017-11-27 DIAGNOSIS — G40909 Epilepsy, unspecified, not intractable, without status epilepticus: Secondary | ICD-10-CM | POA: Diagnosis present

## 2017-11-27 DIAGNOSIS — D631 Anemia in chronic kidney disease: Secondary | ICD-10-CM | POA: Diagnosis present

## 2017-11-27 DIAGNOSIS — J9621 Acute and chronic respiratory failure with hypoxia: Secondary | ICD-10-CM | POA: Diagnosis present

## 2017-11-27 DIAGNOSIS — I69391 Dysphagia following cerebral infarction: Secondary | ICD-10-CM

## 2017-11-27 DIAGNOSIS — N186 End stage renal disease: Secondary | ICD-10-CM | POA: Diagnosis present

## 2017-11-27 DIAGNOSIS — Z992 Dependence on renal dialysis: Secondary | ICD-10-CM | POA: Diagnosis not present

## 2017-11-27 DIAGNOSIS — R570 Cardiogenic shock: Secondary | ICD-10-CM | POA: Diagnosis not present

## 2017-11-27 DIAGNOSIS — I351 Nonrheumatic aortic (valve) insufficiency: Secondary | ICD-10-CM | POA: Diagnosis not present

## 2017-11-27 DIAGNOSIS — I5033 Acute on chronic diastolic (congestive) heart failure: Secondary | ICD-10-CM | POA: Diagnosis present

## 2017-11-27 DIAGNOSIS — R059 Cough, unspecified: Secondary | ICD-10-CM

## 2017-11-27 DIAGNOSIS — G9341 Metabolic encephalopathy: Secondary | ICD-10-CM | POA: Diagnosis not present

## 2017-11-27 DIAGNOSIS — Y831 Surgical operation with implant of artificial internal device as the cause of abnormal reaction of the patient, or of later complication, without mention of misadventure at the time of the procedure: Secondary | ICD-10-CM | POA: Diagnosis present

## 2017-11-27 DIAGNOSIS — Z7982 Long term (current) use of aspirin: Secondary | ICD-10-CM

## 2017-11-27 DIAGNOSIS — Z87891 Personal history of nicotine dependence: Secondary | ICD-10-CM

## 2017-11-27 DIAGNOSIS — J9600 Acute respiratory failure, unspecified whether with hypoxia or hypercapnia: Secondary | ICD-10-CM | POA: Diagnosis present

## 2017-11-27 DIAGNOSIS — Z7901 Long term (current) use of anticoagulants: Secondary | ICD-10-CM | POA: Diagnosis not present

## 2017-11-27 DIAGNOSIS — Z993 Dependence on wheelchair: Secondary | ICD-10-CM

## 2017-11-27 DIAGNOSIS — Z9989 Dependence on other enabling machines and devices: Secondary | ICD-10-CM

## 2017-11-27 DIAGNOSIS — Z6824 Body mass index (BMI) 24.0-24.9, adult: Secondary | ICD-10-CM

## 2017-11-27 DIAGNOSIS — Z79899 Other long term (current) drug therapy: Secondary | ICD-10-CM

## 2017-11-27 DIAGNOSIS — T82868A Thrombosis of vascular prosthetic devices, implants and grafts, initial encounter: Secondary | ICD-10-CM | POA: Diagnosis present

## 2017-11-27 DIAGNOSIS — I340 Nonrheumatic mitral (valve) insufficiency: Secondary | ICD-10-CM | POA: Diagnosis not present

## 2017-11-27 DIAGNOSIS — I700 Atherosclerosis of aorta: Secondary | ICD-10-CM | POA: Diagnosis present

## 2017-11-27 DIAGNOSIS — I4581 Long QT syndrome: Secondary | ICD-10-CM | POA: Diagnosis present

## 2017-11-27 DIAGNOSIS — Z885 Allergy status to narcotic agent status: Secondary | ICD-10-CM

## 2017-11-27 DIAGNOSIS — I959 Hypotension, unspecified: Secondary | ICD-10-CM | POA: Diagnosis not present

## 2017-11-27 DIAGNOSIS — N62 Hypertrophy of breast: Secondary | ICD-10-CM | POA: Diagnosis present

## 2017-11-27 HISTORY — DX: Endocarditis, valve unspecified: I38

## 2017-11-27 HISTORY — DX: Cerebral infarction, unspecified: I63.9

## 2017-11-27 LAB — CBC
HCT: 41.6 % (ref 39.0–52.0)
Hemoglobin: 12.1 g/dL — ABNORMAL LOW (ref 13.0–17.0)
MCH: 27.1 pg (ref 26.0–34.0)
MCHC: 29.1 g/dL — AB (ref 30.0–36.0)
MCV: 93.1 fL (ref 80.0–100.0)
PLATELETS: 369 10*3/uL (ref 150–400)
RBC: 4.47 MIL/uL (ref 4.22–5.81)
RDW: 19.4 % — ABNORMAL HIGH (ref 11.5–15.5)
WBC: 6.9 10*3/uL (ref 4.0–10.5)
nRBC: 0 % (ref 0.0–0.2)

## 2017-11-27 LAB — COMPREHENSIVE METABOLIC PANEL
ALT: 11 U/L (ref 0–44)
ANION GAP: 10 (ref 5–15)
AST: 28 U/L (ref 15–41)
Albumin: 2.4 g/dL — ABNORMAL LOW (ref 3.5–5.0)
Alkaline Phosphatase: 127 U/L — ABNORMAL HIGH (ref 38–126)
BUN: 10 mg/dL (ref 6–20)
CALCIUM: 8 mg/dL — AB (ref 8.9–10.3)
CO2: 27 mmol/L (ref 22–32)
CREATININE: 3.64 mg/dL — AB (ref 0.61–1.24)
Chloride: 102 mmol/L (ref 98–111)
GFR, EST AFRICAN AMERICAN: 22 mL/min — AB (ref >60)
GFR, EST NON AFRICAN AMERICAN: 19 mL/min — AB (ref >60)
Glucose, Bld: 105 mg/dL — ABNORMAL HIGH (ref 70–99)
Potassium: 3.9 mmol/L (ref 3.5–5.1)
Sodium: 139 mmol/L (ref 135–145)
Total Bilirubin: 1.4 mg/dL — ABNORMAL HIGH (ref 0.3–1.2)
Total Protein: 5.9 g/dL — ABNORMAL LOW (ref 6.5–8.1)

## 2017-11-27 LAB — APTT: APTT: 39 s — AB (ref 24–36)

## 2017-11-27 LAB — PROTIME-INR
INR: 2.18
PROTHROMBIN TIME: 24 s — AB (ref 11.4–15.2)

## 2017-11-27 NOTE — ED Triage Notes (Signed)
Patient arrived by EMS from home for SOB. Dialysis patient with access in L chest. HX stroke with L side deficits. Patient is wheelchair/bedbound. Patient was seen by PCP and prescribed cough medicine. Patient has been trying to get oxygen for home and got denied. Was here recently for same
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
## 2017-11-27 NOTE — Progress Notes (Signed)
Family Meeting Note  Advance Directive:yes  Today a meeting took place with the Patient and mother.  The following clinical team members were present during this meeting:MD  The following were discussed:Patient's diagnosis: End-stage renal disease on hemodialysis status post stroke and hemiplegia with bedbound status, endocarditis, pleural effusion, Patient's progosis: Unable to determine and Goals for treatment: DNR  Additional follow-up to be provided: nephrology, ID  Time spent during discussion:20 minutes  Altamese Dilling, MD

## 2017-11-28 ENCOUNTER — Encounter: Payer: Self-pay | Admitting: *Deleted

## 2017-11-28 ENCOUNTER — Inpatient Hospital Stay: Payer: Medicare Other

## 2017-11-28 DIAGNOSIS — R05 Cough: Secondary | ICD-10-CM

## 2017-11-28 DIAGNOSIS — I120 Hypertensive chronic kidney disease with stage 5 chronic kidney disease or end stage renal disease: Secondary | ICD-10-CM

## 2017-11-28 DIAGNOSIS — Z87891 Personal history of nicotine dependence: Secondary | ICD-10-CM

## 2017-11-28 DIAGNOSIS — Z885 Allergy status to narcotic agent status: Secondary | ICD-10-CM

## 2017-11-28 DIAGNOSIS — J9601 Acute respiratory failure with hypoxia: Secondary | ICD-10-CM

## 2017-11-28 DIAGNOSIS — J90 Pleural effusion, not elsewhere classified: Secondary | ICD-10-CM

## 2017-11-28 DIAGNOSIS — I080 Rheumatic disorders of both mitral and aortic valves: Secondary | ICD-10-CM

## 2017-11-28 DIAGNOSIS — R011 Cardiac murmur, unspecified: Secondary | ICD-10-CM

## 2017-11-28 DIAGNOSIS — Z8614 Personal history of Methicillin resistant Staphylococcus aureus infection: Secondary | ICD-10-CM

## 2017-11-28 DIAGNOSIS — Z888 Allergy status to other drugs, medicaments and biological substances status: Secondary | ICD-10-CM

## 2017-11-28 DIAGNOSIS — I69354 Hemiplegia and hemiparesis following cerebral infarction affecting left non-dominant side: Secondary | ICD-10-CM

## 2017-11-28 DIAGNOSIS — N186 End stage renal disease: Secondary | ICD-10-CM

## 2017-11-28 DIAGNOSIS — Z992 Dependence on renal dialysis: Secondary | ICD-10-CM

## 2017-11-28 DIAGNOSIS — Z9884 Bariatric surgery status: Secondary | ICD-10-CM

## 2017-11-28 DIAGNOSIS — Z8701 Personal history of pneumonia (recurrent): Secondary | ICD-10-CM

## 2017-11-28 DIAGNOSIS — Z7189 Other specified counseling: Secondary | ICD-10-CM

## 2017-11-28 LAB — ALBUMIN, PLEURAL OR PERITONEAL FLUID

## 2017-11-28 LAB — BASIC METABOLIC PANEL
ANION GAP: 12 (ref 5–15)
BUN: 13 mg/dL (ref 6–20)
CALCIUM: 8.2 mg/dL — AB (ref 8.9–10.3)
CO2: 26 mmol/L (ref 22–32)
Chloride: 104 mmol/L (ref 98–111)
Creatinine, Ser: 4.34 mg/dL — ABNORMAL HIGH (ref 0.61–1.24)
GFR, EST AFRICAN AMERICAN: 17 mL/min — AB (ref >60)
GFR, EST NON AFRICAN AMERICAN: 15 mL/min — AB (ref >60)
GLUCOSE: 93 mg/dL (ref 70–99)
Potassium: 4.1 mmol/L (ref 3.5–5.1)
SODIUM: 142 mmol/L (ref 135–145)

## 2017-11-28 LAB — CBC
HCT: 40.8 % (ref 39.0–52.0)
Hemoglobin: 11.8 g/dL — ABNORMAL LOW (ref 13.0–17.0)
MCH: 26.7 pg (ref 26.0–34.0)
MCHC: 28.9 g/dL — ABNORMAL LOW (ref 30.0–36.0)
MCV: 92.3 fL (ref 80.0–100.0)
NRBC: 0 % (ref 0.0–0.2)
PLATELETS: 414 10*3/uL — AB (ref 150–400)
RBC: 4.42 MIL/uL (ref 4.22–5.81)
RDW: 19.5 % — ABNORMAL HIGH (ref 11.5–15.5)
WBC: 6.3 10*3/uL (ref 4.0–10.5)

## 2017-11-28 LAB — GLUCOSE, PLEURAL OR PERITONEAL FLUID: GLUCOSE FL: 99 mg/dL

## 2017-11-28 LAB — PROTEIN, PLEURAL OR PERITONEAL FLUID: Total protein, fluid: <3 g/dL

## 2017-11-28 LAB — MRSA PCR SCREENING: MRSA BY PCR: NEGATIVE

## 2017-11-28 MED ORDER — MIDODRINE HCL 5 MG PO TABS
15.0000 mg | ORAL_TABLET | Freq: Three times a day (TID) | ORAL | Status: DC
  Administered 2017-11-28: 15 mg via ORAL
  Filled 2017-11-28 (×8): qty 3

## 2017-11-28 MED ORDER — VITAMIN D (ERGOCALCIFEROL) 1.25 MG (50000 UNIT) PO CAPS
50000.0000 [IU] | ORAL_CAPSULE | ORAL | Status: DC
  Administered 2017-11-28: 50000 [IU] via ORAL
  Filled 2017-11-28: qty 1

## 2017-11-28 MED ORDER — OCUVITE-LUTEIN PO CAPS
1.0000 | ORAL_CAPSULE | Freq: Every day | ORAL | Status: DC
  Administered 2017-11-28: 1 via ORAL
  Filled 2017-11-28: qty 1

## 2017-11-28 MED ORDER — VITAMIN A 10000 UNITS PO CAPS
10000.0000 [IU] | ORAL_CAPSULE | Freq: Every day | ORAL | Status: DC
  Administered 2017-11-28: 10000 [IU] via ORAL
  Filled 2017-11-28 (×3): qty 1

## 2017-11-28 MED ORDER — SEVELAMER CARBONATE 2.4 G PO PACK
2.4000 g | PACK | Freq: Three times a day (TID) | ORAL | Status: DC
  Administered 2017-11-28: 2.4 g via ORAL
  Filled 2017-11-28 (×12): qty 1

## 2017-11-28 MED ORDER — VITAMIN C 500 MG PO TABS
250.0000 mg | ORAL_TABLET | Freq: Two times a day (BID) | ORAL | Status: DC
  Administered 2017-11-28 (×2): 250 mg via ORAL
  Filled 2017-11-28 (×3): qty 0.5
  Filled 2017-11-28 (×2): qty 1

## 2017-11-28 MED ORDER — SENNOSIDES-DOCUSATE SODIUM 8.6-50 MG PO TABS
2.0000 | ORAL_TABLET | Freq: Two times a day (BID) | ORAL | Status: DC
  Administered 2017-11-28 (×2): 2 via ORAL
  Filled 2017-11-28 (×3): qty 2

## 2017-11-28 MED ORDER — GABAPENTIN 300 MG PO CAPS
300.0000 mg | ORAL_CAPSULE | Freq: Two times a day (BID) | ORAL | Status: DC
  Administered 2017-11-28 (×2): 300 mg via ORAL
  Filled 2017-11-28 (×3): qty 1

## 2017-11-28 MED ORDER — WARFARIN SODIUM 3 MG PO TABS
3.0000 mg | ORAL_TABLET | Freq: Every day | ORAL | Status: DC
  Administered 2017-11-28: 3 mg via ORAL
  Filled 2017-11-28 (×2): qty 1

## 2017-11-28 MED ORDER — HEPARIN (PORCINE) 25000 UT/250ML-% IV SOLN
1150.0000 [IU]/h | INTRAVENOUS | Status: DC
  Administered 2017-11-28: 1150 [IU]/h via INTRAVENOUS
  Filled 2017-11-28: qty 250

## 2017-11-28 MED ORDER — PANTOPRAZOLE SODIUM 20 MG PO TBEC
20.0000 mg | DELAYED_RELEASE_TABLET | Freq: Every day | ORAL | Status: DC
  Administered 2017-11-28: 20 mg via ORAL
  Filled 2017-11-28 (×3): qty 1

## 2017-11-28 MED ORDER — RENA-VITE PO TABS
1.0000 | ORAL_TABLET | Freq: Every day | ORAL | Status: DC
  Administered 2017-11-28: 1 via ORAL
  Filled 2017-11-28 (×4): qty 1

## 2017-11-28 MED ORDER — CINACALCET HCL 30 MG PO TABS
60.0000 mg | ORAL_TABLET | Freq: Every day | ORAL | Status: DC
  Administered 2017-11-28: 60 mg via ORAL
  Filled 2017-11-28 (×6): qty 2

## 2017-11-28 MED ORDER — POLYETHYLENE GLYCOL 3350 17 G PO PACK: 1.0000 | PACK | Freq: Every day | ORAL | Status: DC | PRN

## 2017-11-28 MED ORDER — HEPARIN BOLUS VIA INFUSION
4600.0000 [IU] | Freq: Once | INTRAVENOUS | Status: AC
  Administered 2017-11-28: 4600 [IU] via INTRAVENOUS
  Filled 2017-11-28: qty 4600

## 2017-11-28 MED ORDER — WARFARIN - PHARMACIST DOSING INPATIENT: Freq: Every day | Status: DC

## 2017-11-28 MED ORDER — ALPRAZOLAM 1 MG PO TABS: 2.0000 mg | ORAL_TABLET | Freq: Every evening | ORAL | Status: DC | PRN

## 2017-11-28 MED ORDER — ASPIRIN 81 MG PO CHEW
81.0000 mg | CHEWABLE_TABLET | Freq: Every day | ORAL | Status: DC
  Administered 2017-11-28: 81 mg via ORAL
  Filled 2017-11-28: qty 1

## 2017-11-28 MED ORDER — ESCITALOPRAM OXALATE 10 MG PO TABS
20.0000 mg | ORAL_TABLET | Freq: Every day | ORAL | Status: DC
  Administered 2017-11-28: 20 mg via ORAL
  Filled 2017-11-28 (×6): qty 2

## 2017-11-28 MED ORDER — ACETAMINOPHEN 325 MG PO TABS: 650.0000 mg | ORAL_TABLET | Freq: Four times a day (QID) | ORAL | Status: DC | PRN

## 2017-11-28 MED ORDER — ADULT MULTIVITAMIN W/MINERALS CH: 1.0000 | ORAL_TABLET | Freq: Every day | ORAL | Status: DC

## 2017-11-28 MED ORDER — DOCUSATE SODIUM 100 MG PO CAPS: 100.0000 mg | ORAL_CAPSULE | Freq: Two times a day (BID) | ORAL | Status: DC | PRN

## 2017-11-28 NOTE — Progress Notes (Signed)
ANTICOAGULATION CONSULT NOTE   Pharmacy Consult for Warfarin Indication: atrial fibrillation  Allergies  Allergen Reactions  . Depakote [Divalproex Sodium] Other (See Comments)    Hallucinations   . Hydromorphone Other (See Comments)    Patient Measurements: Height: 5\' 6"  (167.6 cm) Weight: 170 lb 1.6 oz (77.2 kg) IBW/kg (Calculated) : 63.8 Heparin Dosing Weight: 77 kg  Vital Signs: Temp: 98.2 F (36.8 C) (11/12 0742) Temp Source: Oral (11/12 0742) BP: 103/44 (11/12 1423) Pulse Rate: 100 (11/12 1423)  Labs: Recent Labs    11/22/2017 1915 11/24/2017 2154 11/28/17 0441  HGB 12.1*  --  11.8*  HCT 41.6  --  40.8  PLT 369  --  414*  APTT  --  39*  --   LABPROT  --  24.0*  --   INR  --  2.18  --   CREATININE 3.64*  --  4.34*    Estimated Creatinine Clearance: 20.8 mL/min (A) (by C-G formula based on SCr of 4.34 mg/dL (H)).   Medical History: Past Medical History:  Diagnosis Date  . Acute meniscal tear of left knee   . Acute meniscal tear of right knee   . Anxiety   . Chronic back pain    due to weight   . Dialysis patient (HCC)   . Endocarditis   . Fracture of transverse process of thoracic vertebra (HCC)    [facility]surgery  . GERD (gastroesophageal reflux disease)   . Hemodialysis patient [facility](HCC)    since 2011, does hemodialysis at home, wife does she is a [employer] , Daily except wed and sun  . High blood pressure    hx of   . Kidney failure    on dialysis since 2011- [facility]- nephrologist   . Seizures (HCC)   . Sleep apnea    cpap-   . Stroke [facility](HCC)     Medications:  Warfarin 3mg  daily  at home  Assessment: INR 2.16 on admission. Patient's warfarin was held and started on Heparin drip for thoracentesis. Patient is now s/p thoracentesis and Dr. Allena KatzPatel has ordered that Heparin be stopped and warfarin resumed.  Goal of Therapy:  INR 2- 3   Plan:  Will resume home dose of Warfarin 3mg  daily as patient was therapeutic on  this dose. Will check INR with AM labs.  Clovia CuffLisa Lindamarie Maclachlan, PharmD, BCPS 11/28/2017 3:33 PM

## 2017-11-28 NOTE — Progress Notes (Signed)
ANTICOAGULATION CONSULT NOTE - Initial Consult  Pharmacy Consult for heparin drip Indication: atrial fibrillation, bridge for possible procedure  Allergies  Allergen Reactions  . Depakote [Divalproex Sodium] Other (See Comments)    Hallucinations   . Hydromorphone Other (See Comments)    Patient Measurements: Height: 5\' 6"  (167.6 cm) Weight: 170 lb 1.6 oz (77.2 kg) IBW/kg (Calculated) : 63.8 Heparin Dosing Weight: 77 kg  Vital Signs: Temp: 98.2 F (36.8 C) (11/12 0742) Temp Source: Oral (11/12 0742) BP: 103/44 (11/12 1423) Pulse Rate: 100 (11/12 1423)  Labs: Recent Labs    02-Jul-2017 1915 02-Jul-2017 2154 11/28/17 0441  HGB 12.1*  --  11.8*  HCT 41.6  --  40.8  PLT 369  --  414*  APTT  --  39*  --   LABPROT  --  24.0*  --   INR  --  2.18  --   CREATININE 3.64*  --  4.34*    Estimated Creatinine Clearance: 20.8 mL/min (A) (by C-G formula based on SCr of 4.34 mg/dL (H)).   Medical History: Past Medical History:  Diagnosis Date  . Acute meniscal tear of left knee   . Acute meniscal tear of right knee   . Anxiety   . Chronic back pain    due to weight   . Dialysis patient (HCC)   . Endocarditis   . Fracture of transverse process of thoracic vertebra (HCC)    [facility]- [facility]surgery  . GERD (gastroesophageal reflux disease)   . Hemodialysis patient [facility](HCC)    since 2011, does hemodialysis at home, wife does she is a [employer] , Daily except wed and sun  . High blood pressure    hx of   . Kidney failure    [facility] since [facility]- nephrolog[facility]   . Seizures (HCC)   . Sleep apnea    cpap-   . Stroke [facility](HCC)     Medications:  Warfarin at home  Assessment: INR 2.16 Goal of Therapy:  Heparin level 0.3-0.7 units/ml Monitor platelets by anticoagulation protocol: Yes   Plan:  Call from RN on floor - restart UFH? Patient currently has DVT. Spoke with RN in IR - they held UFH for procedure (thoracentesis) and patient should be  resumed on UFH for DVT. RN in IR says no post-IR hold time is necessary and infusion may be resumed now. Since first level (13:00) was cancelled due to being in a procedural area/infusion held for procedure we will resume heparin infusion at previous rate 1150 units/hr. Check first heparin level 8 hours after resuming heparin infusion.   Perry FrostNathan A Jazlyn [name], Pharm.D., BCPS Clinical Pharmacist 11/28/2017,2:49 PM
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
## 2017-11-28 NOTE — Plan of Care (Signed)
Heparin drip stopped, per OR verbal orders, in preparation for thoracentesis in approx 2 hours.

## 2017-11-28 NOTE — Procedures (Signed)
Interventional Radiology Procedure Note  Procedure: US guided right thoracentesis  Complications: None  Estimated Blood Loss: None  Findings: 2 L yellowish fluid removed. Sample sent for requested labs.  Jodi MarbleGlenn T. Fredia SorrowYamagata, M.D Pager:  [phone]
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
## 2017-11-28 NOTE — Progress Notes (Signed)
[facility]  ROUNDING NOTE   Subjective:  Patient well-known to [place]. Came in yesterday with significant shortness of breath. Found to have pleural effusion on chest x-ray and Graciela Husbands for thoracentesis today. He did complete hemodialysis yesterday.  Objective:  Vital signs in last 24 hours:  Temp:  [97.4 F (36.3 C)-98.2 F (36.8 C)] 98.2 F (36.8 C) (11/12 0742) Pulse Rate:  [96-100] 96 (11/12 0742) Resp:  [20-34] 20 (11/12 0742) BP: (100-126)/(31-51) 101/38 (11/12 0742) SpO2:  [94 %-100 %] 100 % (11/12 0742) Weight:  [77.2 kg] 77.2 kg (11/12 0021)  Weight change:  Filed Weights   11/28/17 0021  Weight: 77.2 kg    Intake/Output: I/O last 3 completed shifts: In: 89.8 [P.O.:50; I.V.:39.8] Out: -    Intake/Output this shift:  No intake/output data recorded.  Physical Exam: General: Chronically ill appearing  Head: Normocephalic, atraumatic. Moist oral mucosal membranes  Eyes: Anicteric  Neck: Supple, trachea midline  Lungs:  Scattered rhonchi, diminished at bases, normal effort  Heart: S1S2 no rubs  Abdomen:  Soft, nontender, bowel sounds present  Extremities: 1+ peripheral edema.  Neurologic: Lethargic but arousable  Skin: No lesions  Access: L IJ permcath    Basic Metabolic Panel: Recent Labs  Lab 2017-12-14 1915 11/28/17 0441  NA 139 142  K 3.9 4.1  CL 102 104  CO2 27 26  GLUCOSE 105* 93  BUN 10 13  CREATININE 3.64* 4.34*  CALCIUM 8.0* 8.2*    Liver Function Tests: Recent Labs  Lab December 14, 2017 1915  AST 28  ALT 11  ALKPHOS 127*  BILITOT 1.4*  PROT 5.9*  ALBUMIN 2.4*   No results for input(s): LIPASE, AMYLASE in the last 168 hours. No results for input(s): AMMONIA in the last 168 hours.  CBC: Recent Labs  Lab 12/14/17 1915 11/28/17 0441  WBC 6.9 6.3  HGB 12.1* 11.8*  HCT 41.6 40.8  MCV 93.1 92.3  PLT 369 414*    Cardiac Enzymes: No results for input(s): CKTOTAL, CKMB, CKMBINDEX, TROPONINI in the last 168  hours.  BNP: Invalid input(s): POCBNP  CBG: No results for input(s): GLUCAP in the last 168 hours.  Microbiology: Results for orders placed or performed during the hospital encounter of 2017/12/14  Culture, blood (single) w Reflex to ID Panel     Status: None (Preliminary result)   Collection Time: 2017-12-14  9:54 PM  Result Value Ref Range Status   Specimen Description BLOOD RIGHT ANTECUBITAL  Final   Special Requests   Final    BOTTLES DRAWN AEROBIC AND ANAEROBIC Blood Culture results may not be optimal due to an excessive volume of blood received in culture bottles   Culture   Final    NO GROWTH < 12 HOURS Performed at [facility], [address], [place], [place] [address]    Report Status PENDING  Incomplete  MRSA PCR Screening     Status: None   Collection Time: 11/28/17 12:31 AM  Result Value Ref Range Status   MRSA by PCR NEGATIVE NEGATIVE Final    Comment:        The GeneXpert MRSA Assay (FDA approved for NASAL specimens only), is one component of a comprehensive MRSA colonization surveillance program. It is not intended to diagnose MRSA infection nor to guide or monitor treatment for MRSA infections. Performed at [facility], [address], [place], [place] [address]     Coagulation Studies: Recent Labs    2017/12/14 Jun 10, 2152  LABPROT 24.0*  INR  2.18    Urinalysis: No results for input(s): COLORURINE, LABSPEC, PHURINE, GLUCOSEU, HGBUR, BILIRUBINUR, KETONESUR, PROTEINUR, UROBILINOGEN, NITRITE, LEUKOCYTESUR in the last 72 hours.  Invalid input(s): APPERANCEUR    Imaging: Ct Chest Wo Contrast  Result Date: 11/19/2017 CLINICAL DATA:  Acute onset of shortness of breath. Left-sided weakness and cough. EXAM: CT CHEST WITHOUT CONTRAST TECHNIQUE: Multidetector CT imaging of the chest was performed following the standard protocol without IV contrast. COMPARISON:  Chest radiograph performed earlier today at 7:17 p.m., and CTA of the chest  performed 08/18/2017 FINDINGS: Cardiovascular: The heart is mildly enlarged. A left IJ line is noted ending about the proximal right atrium. Scattered coronary artery calcifications are seen. Mild calcification is noted at the aortic arch and proximal great vessels. Mediastinum/Nodes: The mediastinum is otherwise unremarkable in appearance. No mediastinal lymphadenopathy is seen. No pericardial effusion is identified. The thyroid gland is unremarkable. No axillary lymphadenopathy is seen. Lungs/Pleura: Large right and small left pleural effusions are noted, with partial consolidation of both lower lung lobes. Increased interstitial markings may reflect pulmonary edema. No pneumothorax is seen. No dominant mass is identified, though evaluation is suboptimal given the pleural effusions. Upper Abdomen: The visualized portions of the liver and spleen are unremarkable. The visualized portions of the gallbladder, pancreas and adrenal glands are within normal limits. Severe bilateral renal atrophy is noted. Scattered calcification is seen along the proximal abdominal aorta. Musculoskeletal: No acute osseous abnormalities are identified. The visualized musculature is unremarkable in appearance. Right-sided gynecomastia is noted. Prominent subcutaneous vasculature is noted about the left chest wall. IMPRESSION: 1. Large right and small left pleural effusions, with partial consolidation of both lower lung lobes. Increased interstitial markings may reflect pulmonary edema. 2. Severe bilateral renal atrophy, reflecting the patient's end-stage renal disease. 3. Right-sided gynecomastia noted. 4. Scattered coronary artery calcifications seen. Mild cardiomegaly. Aortic Atherosclerosis (ICD10-I70.0). Electronically Signed   By: Roanna Raider M.D.   On: 11/26/2017 21:23   Dg Chest Port 1 View  Result Date: 11/18/2017 CLINICAL DATA:  Shortness of breath. Cough. On dialysis. EXAM: PORTABLE CHEST 1 VIEW COMPARISON:  09/21/2017  FINDINGS: The patient is rotated to the left. Dialysis catheter terminates near the superior cavoatrial junction. Vascular stents are noted in the right subclavian region. The cardiomediastinal silhouette is unchanged. Aortic atherosclerosis is noted. There is a moderate-sized right pleural effusion which has enlarged. There is a persistent small left pleural effusion. Pulmonary vascular congestion is again noted with bilateral interstitial and hazy airspace opacities, similar to the prior study. No pneumothorax is identified. No acute osseous abnormality is identified. IMPRESSION: 1. Moderate right pleural effusion, increased from the prior study. Persistent small left pleural effusion. 2. Similar appearance of pulmonary edema. Electronically Signed   By: Sebastian Ache M.D.   On: 12/10/2017 19:35     Medications:   . heparin Stopped (11/28/17 0820)   . aspirin  81 mg Oral Daily  . cinacalcet  60 mg Oral QHS  . escitalopram  20 mg Oral QHS  . gabapentin  300 mg Oral BID  . midodrine  15 mg Oral Q8H  . multivitamin  1 tablet Oral QHS  . multivitamin with minerals  1 tablet Oral Daily  . multivitamin-lutein  1 capsule Oral Daily  . pantoprazole  20 mg Oral Daily  . senna-docusate  2 tablet Oral BID  . sevelamer carbonate  2.4 g Oral TID WC  . vitamin A  10,000 Units Oral Daily  . ascorbic acid  250 mg Oral  BID  . Vitamin D (Ergocalciferol)  50,000 Units Oral Q7 days   acetaminophen, alprazolam, docusate sodium, polyethylene glycol  Assessment/ Plan:  47 y.o. 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  [facility]/MWF  1.  ESRD on HD MWF.  Patient did complete hemodialysis yesterday.  No acute indication for dialysis today.  We  will plan for dialysis again tomorrow.  2.  Anemia chronic kidney disease.  Hemoglobin currently 11.8.  Hold off on Epogen for now.  3.  Secondary hyperparathyroidism.  Maintain the patient on Renvela 2.4 g by mouth 3 times a day with meals.  4.  Pleural effusion.  Patient to go for thoracentesis today.  5.  Hypotension.  Maintain the patient on my to 15 mg every 8 hours.  6.  Disposition as per hospitalist.   LOS: 1 [place] Hildebrant 11/12/201912:03 PM

## 2017-11-28 NOTE — Progress Notes (Signed)
[facility] [place] [facility]   PATIENT NAME: Perry [name]    MR#:  [id number]  DATE OF BIRTH:  04/29/1970  SUBJECTIVE:  patient came in with increasing shortness of breath and was found to have low oxygen. He status post thoracentesis feels better. He is bedbound taken care of my mother at home.  REVIEW OF SYSTEMS:   Review of Systems  Constitutional: Negative for chills, fever and weight loss.  HENT: Negative for ear discharge, ear pain and nosebleeds.   Eyes: Negative for blurred vision, pain and discharge.  Respiratory: Positive for shortness of breath. Negative for sputum production, wheezing and stridor.   Cardiovascular: Negative for chest pain, palpitations, orthopnea and PND.  Gastrointestinal: Negative for abdominal pain, diarrhea, nausea and vomiting.  Genitourinary: Negative for frequency and urgency.  Musculoskeletal: Negative for back pain and joint pain.  Neurological: Negative for sensory change, speech change, focal weakness and weakness.  Psychiatric/Behavioral: Negative for depression and hallucinations. The patient is not nervous/anxious.    Tolerating Diet:yes Tolerating PT: bed bound  DRUG ALLERGIES:   Allergies  Allergen Reactions  . Depakote [Divalproex Sodium] Other (See Comments)    Hallucinations   . Hydromorphone Other (See Comments)    VITALS:  Blood pressure (!) 103/44, pulse 100, temperature 98.2 F (36.8 C), temperature source Oral, resp. rate 20, height 5\' 6"  (1.676 m), weight 77.2 kg, SpO2 100 %.  PHYSICAL EXAMINATION:   Physical Exam  GENERAL:  47 y.o.-year-old patient lying in the bed with no acute distress. obese EYES: Pupils equal, round, reactive to light and accommodation. No scleral icterus. Extraocular muscles intact.  HEENT: Head atraumatic, normocephalic. Oropharynx and nasopharynx clear.  NECK:  Supple, no jugular venous distention. No thyroid enlargement, no tenderness.  LUNGS: Normal breath  sounds bilaterally, no wheezing, rales, rhonchi. No use of accessory muscles of respiration. Perm cath left upper chest CARDIOVASCULAR: S1, S2 normal. No murmurs, rubs, or gallops.  ABDOMEN: Soft, nontender, nondistended. Bowel sounds present. No organomegaly or mass.  EXTREMITIES: No cyanosis, clubbing or edema b/l.    NEUROLOGIC: Cranial nerves II through XII are intact. Chronic hemiplegia PSYCHIATRIC:  patient is alert and oriented x 3. SKIN: No obvious rash, lesion, or ulcer.   LABORATORY PANEL:  CBC Recent Labs  Lab 11/28/17 0441  WBC 6.3  HGB 11.8*  HCT 40.8  PLT 414*    Chemistries  Recent Labs  Lab 11/21/2017 1915 11/28/17 0441  NA 139 142  K 3.9 4.1  CL 102 104  CO2 27 26  GLUCOSE 105* 93  BUN 10 13  CREATININE 3.64* 4.34*  CALCIUM 8.0* 8.2*  AST 28  --   ALT 11  --   ALKPHOS 127*  --   BILITOT 1.4*  --    Cardiac Enzymes No results for input(s): TROPONINI in the last 168 hours. RADIOLOGY:  Dg Chest 1 View  Result Date: 11/28/2017 CLINICAL DATA:  47 year old male status post right-sided thoracentesis EXAM: CHEST  1 VIEW COMPARISON:  Prior chest x-ray 12/14/2017 FINDINGS: No evidence of pneumothorax. Near-total interval resolution of the right-sided pleural effusion. Left IJ approach tunneled hemodialysis catheter. The tip of the catheter overlies the upper atrium. Stable cardiomegaly and pulmonary vascular congestion. No overt edema. Nonspecific left lower lobe opacities. Atherosclerotic calcifications present in the transverse aorta. Vascular stents overlie the right axillary, subclavian and proximal brachiocephalic veins. IMPRESSION: No evidence of pneumothorax status post right-sided thoracentesis. Electronically Signed   By: Isac Caddy.D.  On: 11/28/2017 14:42   Ct Chest Wo Contrast  Result Date: 12-26-2017 CLINICAL DATA:  Acute onset of shortness of breath. Left-sided weakness and cough. EXAM: CT CHEST WITHOUT CONTRAST TECHNIQUE: Multidetector CT  imaging of the chest was performed following the standard protocol without IV contrast. COMPARISON:  Chest radiograph performed earlier today at 7:17 p.m., and CTA of the chest performed 08/18/2017 FINDINGS: Cardiovascular: The heart is mildly enlarged. A left IJ line is noted ending about the proximal right atrium. Scattered coronary artery calcifications are seen. Mild calcification is noted at the aortic arch and proximal great vessels. Mediastinum/Nodes: The mediastinum is otherwise unremarkable in appearance. No mediastinal lymphadenopathy is seen. No pericardial effusion is identified. The thyroid gland is unremarkable. No axillary lymphadenopathy is seen. Lungs/Pleura: Large right and small left pleural effusions are noted, with partial consolidation of both lower lung lobes. Increased interstitial markings may reflect pulmonary edema. No pneumothorax is seen. No dominant mass is identified, though evaluation is suboptimal given the pleural effusions. Upper Abdomen: The visualized portions of the liver and spleen are unremarkable. The visualized portions of the gallbladder, pancreas and adrenal glands are within normal limits. Severe bilateral renal atrophy is noted. Scattered calcification is seen along the proximal abdominal aorta. Musculoskeletal: No acute osseous abnormalities are identified. The visualized musculature is unremarkable in appearance. Right-sided gynecomastia is noted. Prominent subcutaneous vasculature is noted about the left chest wall. IMPRESSION: 1. Large right and small left pleural effusions, with partial consolidation of both lower lung lobes. Increased interstitial markings may reflect pulmonary edema. 2. Severe bilateral renal atrophy, reflecting the patient's end-stage renal disease. 3. Right-sided gynecomastia noted. 4. Scattered coronary artery calcifications seen. Mild cardiomegaly. Aortic Atherosclerosis (ICD10-I70.0). Electronically Signed   By: Roanna Raider M.D.   On:  12-26-2017 21:23   Dg Chest Port 1 View  Result Date: Dec 26, 2017 CLINICAL DATA:  Shortness of breath. Cough. On dialysis. EXAM: PORTABLE CHEST 1 VIEW COMPARISON:  09/21/2017 FINDINGS: The patient is rotated to the left. Dialysis catheter terminates near the superior cavoatrial junction. Vascular stents are noted in the right subclavian region. The cardiomediastinal silhouette is unchanged. Aortic atherosclerosis is noted. There is a moderate-sized right pleural effusion which has enlarged. There is a persistent small left pleural effusion. Pulmonary vascular congestion is again noted with bilateral interstitial and hazy airspace opacities, similar to the prior study. No pneumothorax is identified. No acute osseous abnormality is identified. IMPRESSION: 1. Moderate right pleural effusion, increased from the prior study. Persistent small left pleural effusion. 2. Similar appearance of pulmonary edema. Electronically Signed   By: Sebastian Ache M.D.   On: 12-26-17 19:35   US Thoracentesis Asp Pleural Space W/img Guide  Result Date: 11/28/2017 CLINICAL DATA:  Large right pleural effusion. EXAM: ULTRASOUND GUIDED RIGHT THORACENTESIS COMPARISON:  CT of the chest on Dec 26, 2017 PROCEDURE: An ultrasound guided thoracentesis was thoroughly discussed with the patient's mother and questions answered. The benefits, risks, alternatives and complications were also discussed. The patient's mother understands and wishes to proceed with the procedure. Written consent was obtained. Ultrasound was performed to localize and mark an adequate pocket of fluid in the right chest. The area was then prepped and draped in the normal sterile fashion. 1% Lidocaine was used for local anesthesia. Under ultrasound guidance a 6 French Safe-T-Centesis catheter was introduced. Thoracentesis was performed. The catheter was removed and a dressing applied. COMPLICATIONS: None FINDINGS: A total of approximately 2 L of clear, yellow fluid was  removed. A fluid sample was sent for  laboratory analysis. IMPRESSION: Successful ultrasound guided right thoracentesis yielding 2 L of pleural fluid. Electronically Signed   By: Irish LackGlenn  Yamagata M.D.   On: 11/28/2017 15:02   ASSESSMENT AND PLAN:  Sherri Searyrone Elgin  is a 47 y.o. 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 For last 1 week patient has worsening in cough and hypoxia.  He was prescribed oxygen by nephrologist office but his insurance company denied for that.  Continued to feel short of breath along with the cough so came to emergency room.  Noted to be slightly hypoxic but also have pleural effusion on chest x-ray   *Acute hypoxic respiratory failure due to Bilateral pleural effusion  Right >left -patient is status post thoracentesis with removal of 2 L yellow colored fluid -sats 100% on 2liter-- wean to room air -DC IV heparin drip and resumed warfarin - CT scan of the chest which showed no loculated collection or lung injuries.  *Recent endocarditis Currently no fever or elevated white blood cell count. Patient had finished antibiotic course on first November.  *History of stroke and hemiplegia Patient is bedbound and lives with family.  *End-stage renal disease on hemodialysis Nephrologist involved to continue hemodialysis here.  Overall prognosis is very poor because of recent endocarditis, end-stage renal disease, hemiplegia and bedbound status.  D/w mother in the room.   Case discussed with Care Management/Social Worker. Management plans discussed with the patient, family and they are in agreement.  CODE STATUS: DNR  DVT Prophylaxis: heparin  TOTAL TIME TAKING CARE OF THIS PATIENT: *30* minutes.  >50% time spent on counselling and coordination of care  POSSIBLE D/C IN *1-2* DAYS,  DEPENDING ON CLINICAL CONDITION.  Note: This dictation was prepared with Dragon dictation along with smaller phrase technology. Any transcriptional errors that result from this process are unintentional.  Enedina FinnerSona Analaya Hoey M.D on 11/28/2017 at 3:23 PM  Between 7am to 6pm - Pager - [phone]  After 6pm go to [email or web address] - Social research officer, governmentpassword [email or web address]  [facility]  Office  [phone]  CC: Primary care physician; [facility]: Dana Allanyrone E Nanda, male   DOB: Mar 16, 1970, 47 y.o.   MRN: [id number]

## 2017-11-28 NOTE — Progress Notes (Signed)
ANTICOAGULATION CONSULT NOTE - Initial Consult  Pharmacy Consult for heparin drip Indication: atrial fibrillation, bridge for possible procedure  Allergies  Allergen Reactions  . Depakote [Divalproex Sodium] Other (See Comments)    Hallucinations   . Hydromorphone Other (See Comments)    Patient Measurements: Height: 5\' 6"  (167.6 cm) Weight: 170 lb 1.6 oz (77.2 kg) IBW/kg (Calculated) : 63.8 Heparin Dosing Weight: 77 kg  Vital Signs: Temp: 97.4 F (36.3 C) (11/12 0021) Temp Source: Oral (11/12 0021) BP: 104/37 (11/12 0021) Pulse Rate: 97 (11/12 0021)  Labs: Recent Labs    11/17/2017 1915 12/12/2017 2154  HGB 12.1*  --   HCT 41.6  --   PLT 369  --   APTT  --  39*  LABPROT  --  24.0*  INR  --  2.18  CREATININE 3.64*  --     Estimated Creatinine Clearance: 24.8 mL/min (A) (by C-G formula based on SCr of 3.64 mg/dL (H)).   Medical History: Past Medical History:  Diagnosis Date  . Acute meniscal tear of left knee   . Acute meniscal tear of right knee   . Anxiety   . Chronic back pain    due to weight   . Dialysis patient (HCC)   . Endocarditis   . Fracture of transverse process of thoracic vertebra (HCC)    [facility]  . GERD (gastroesophageal reflux disease)   . Hemodialysis patient [facility](HCC)    since 2011, does hemodialysis at home, wife does she is a [employer] , Daily except wed and sun  . High blood pressure    hx of   . Kidney failure    on dialysis since 2011- [facility]- nephrologist   . Seizures (HCC)   . Sleep apnea    cpap-   . Stroke [facility](HCC)     Medications:  Warfarin at home  Assessment: INR 2.16 Goal of Therapy:  Heparin level 0.3-0.7 units/ml Monitor platelets by anticoagulation protocol: Yes   Plan:  4600 unit bolus and initial rate of 1150 units/hr. First heparin level 8 hours after start of infusion.  Neyla Gauntt S 11/28/2017,12:45 AM

## 2017-11-29 ENCOUNTER — Inpatient Hospital Stay: Payer: Medicare Other

## 2017-11-29 DIAGNOSIS — I959 Hypotension, unspecified: Secondary | ICD-10-CM

## 2017-11-29 DIAGNOSIS — R0600 Dyspnea, unspecified: Secondary | ICD-10-CM

## 2017-11-29 DIAGNOSIS — R4189 Other symptoms and signs involving cognitive functions and awareness: Secondary | ICD-10-CM

## 2017-11-29 LAB — BLOOD GAS, ARTERIAL
ACID-BASE EXCESS: 0.1 mmol/L (ref 0.0–2.0)
BICARBONATE: 25.4 mmol/L (ref 20.0–28.0)
FIO2: 0.28
O2 Saturation: 97.2 %
PATIENT TEMPERATURE: 37
PH ART: 7.38 (ref 7.350–7.450)
pCO2 arterial: 43 mmHg (ref 32.0–48.0)
pO2, Arterial: 94 mmHg (ref 83.0–108.0)

## 2017-11-29 LAB — COMPREHENSIVE METABOLIC PANEL
ALBUMIN: 2.9 g/dL — AB (ref 3.5–5.0)
ALK PHOS: 117 U/L (ref 38–126)
ALT: 9 U/L (ref 0–44)
ANION GAP: 16 — AB (ref 5–15)
AST: 12 U/L — ABNORMAL LOW (ref 15–41)
BILIRUBIN TOTAL: 1.2 mg/dL (ref 0.3–1.2)
BUN: 26 mg/dL — ABNORMAL HIGH (ref 6–20)
CALCIUM: 8.4 mg/dL — AB (ref 8.9–10.3)
CO2: 26 mmol/L (ref 22–32)
CREATININE: 6.42 mg/dL — AB (ref 0.61–1.24)
Chloride: 103 mmol/L (ref 98–111)
GFR calc Af Amer: 11 mL/min — ABNORMAL LOW (ref >60)
GFR calc non Af Amer: 9 mL/min — ABNORMAL LOW (ref >60)
GLUCOSE: 105 mg/dL — AB (ref 70–99)
Potassium: 4.1 mmol/L (ref 3.5–5.1)
Sodium: 145 mmol/L (ref 135–145)
TOTAL PROTEIN: 6.4 g/dL — AB (ref 6.5–8.1)

## 2017-11-29 LAB — CBC WITH DIFFERENTIAL/PLATELET
ABS IMMATURE GRANULOCYTES: 0.09 10*3/uL — AB (ref 0.00–0.07)
BASOS ABS: 0 10*3/uL (ref 0.0–0.1)
Basophils Relative: 0 %
Eosinophils Absolute: 0 10*3/uL (ref 0.0–0.5)
Eosinophils Relative: 0 %
HEMATOCRIT: 41 % (ref 39.0–52.0)
Hemoglobin: 11.8 g/dL — ABNORMAL LOW (ref 13.0–17.0)
Immature Granulocytes: 1 %
LYMPHS ABS: 0.6 10*3/uL — AB (ref 0.7–4.0)
Lymphocytes Relative: 4 %
MCH: 27 pg (ref 26.0–34.0)
MCHC: 28.8 g/dL — ABNORMAL LOW (ref 30.0–36.0)
MCV: 93.8 fL (ref 80.0–100.0)
MONO ABS: 0.8 10*3/uL (ref 0.1–1.0)
MONOS PCT: 5 %
NEUTROS ABS: 14.6 10*3/uL — AB (ref 1.7–7.7)
NEUTROS PCT: 90 %
Platelets: 403 10*3/uL — ABNORMAL HIGH (ref 150–400)
RBC: 4.37 MIL/uL (ref 4.22–5.81)
RDW: 19.1 % — ABNORMAL HIGH (ref 11.5–15.5)
WBC: 16.1 10*3/uL — ABNORMAL HIGH (ref 4.0–10.5)
nRBC: 0 % (ref 0.0–0.2)

## 2017-11-29 LAB — GLUCOSE, CAPILLARY
GLUCOSE-CAPILLARY: 127 mg/dL — AB (ref 70–99)
Glucose-Capillary: 105 mg/dL — ABNORMAL HIGH (ref 70–99)
Glucose-Capillary: 94 mg/dL (ref 70–99)

## 2017-11-29 LAB — TROPONIN I
Troponin I: 0.04 ng/mL (ref <0.03)
Troponin I: 0.08 ng/mL (ref <0.03)

## 2017-11-29 LAB — LACTIC ACID, PLASMA: Lactic Acid, Venous: 1.8 mmol/L (ref 0.5–1.9)

## 2017-11-29 LAB — MRSA PCR SCREENING: MRSA BY PCR: NEGATIVE

## 2017-11-29 LAB — PROTIME-INR
INR: 2.09
PROTHROMBIN TIME: 23.2 s — AB (ref 11.4–15.2)

## 2017-11-29 LAB — PROCALCITONIN: Procalcitonin: 4.67 ng/mL

## 2017-11-29 LAB — PROTEIN, BODY FLUID (OTHER): TOTAL PROTEIN, BODY FLUID OTHER: 1.7 g/dL

## 2017-11-29 LAB — PHOSPHORUS: Phosphorus: 5.4 mg/dL — ABNORMAL HIGH (ref 2.5–4.6)

## 2017-11-29 MED ORDER — VANCOMYCIN HCL IN DEXTROSE 750-5 MG/150ML-% IV SOLN
750.0000 mg | INTRAVENOUS | Status: DC
  Filled 2017-11-29: qty 150

## 2017-11-29 MED ORDER — NOREPINEPHRINE 16 MG/250ML-% IV SOLN
0.0000 ug/min | INTRAVENOUS | Status: DC
  Administered 2017-11-29: 16 ug/min via INTRAVENOUS
  Administered 2017-11-30 (×2): 35 ug/min via INTRAVENOUS
  Administered 2017-12-01: 34 ug/min via INTRAVENOUS
  Administered 2017-12-01: 30 ug/min via INTRAVENOUS
  Filled 2017-11-29 (×4): qty 250

## 2017-11-29 MED ORDER — HEPARIN SODIUM (PORCINE) 1000 UNIT/ML DIALYSIS
1000.0000 [IU] | INTRAMUSCULAR | Status: DC | PRN
  Filled 2017-11-29: qty 1

## 2017-11-29 MED ORDER — SODIUM CHLORIDE 0.9 % IV SOLN
INTRAVENOUS | Status: DC
  Administered 2017-11-29 (×2): via INTRAVENOUS

## 2017-11-29 MED ORDER — ALBUMIN HUMAN 25 % IV SOLN
25.0000 g | Freq: Once | INTRAVENOUS | Status: AC
  Administered 2017-11-29: 25 g via INTRAVENOUS
  Filled 2017-11-29: qty 100

## 2017-11-29 MED ORDER — ALTEPLASE 2 MG IJ SOLR
2.0000 mg | Freq: Once | INTRAMUSCULAR | Status: DC | PRN
  Filled 2017-11-29: qty 2

## 2017-11-29 MED ORDER — LIDOCAINE HCL (PF) 1 % IJ SOLN
5.0000 mL | INTRAMUSCULAR | Status: DC | PRN
  Filled 2017-11-29: qty 5

## 2017-11-29 MED ORDER — SODIUM CHLORIDE 0.9 % IV SOLN
1.0000 g | INTRAVENOUS | Status: DC
  Administered 2017-11-29: 1 g via INTRAVENOUS
  Filled 2017-11-29 (×2): qty 1

## 2017-11-29 MED ORDER — SODIUM CHLORIDE 0.9 % IV SOLN: 100.0000 mL | INTRAVENOUS | Status: DC | PRN

## 2017-11-29 MED ORDER — LIDOCAINE-PRILOCAINE 2.5-2.5 % EX CREA
1.0000 "application " | TOPICAL_CREAM | CUTANEOUS | Status: DC | PRN
  Filled 2017-11-29: qty 5

## 2017-11-29 MED ORDER — PENTAFLUOROPROP-TETRAFLUOROETH EX AERO
1.0000 "application " | INHALATION_SPRAY | CUTANEOUS | Status: DC | PRN
  Filled 2017-11-29: qty 30

## 2017-11-29 MED ORDER — NOREPINEPHRINE 4 MG/250ML-% IV SOLN
0.0000 ug/min | INTRAVENOUS | Status: DC
  Administered 2017-11-29: 2 ug/min via INTRAVENOUS
  Filled 2017-11-29: qty 250

## 2017-11-29 MED ORDER — VANCOMYCIN HCL 10 G IV SOLR
1500.0000 mg | Freq: Once | INTRAVENOUS | Status: AC
  Administered 2017-11-29: 1500 mg via INTRAVENOUS
  Filled 2017-11-29: qty 1500

## 2017-11-29 MED ORDER — ALBUMIN HUMAN 25 % IV SOLN
12.5000 g | Freq: Once | INTRAVENOUS | Status: AC
  Administered 2017-11-29: 12.5 g via INTRAVENOUS
  Filled 2017-11-29: qty 50

## 2017-11-29 MED ORDER — CHLORHEXIDINE GLUCONATE CLOTH 2 % EX PADS
6.0000 | MEDICATED_PAD | Freq: Every day | CUTANEOUS | Status: DC
  Administered 2017-11-29: 6 via TOPICAL

## 2017-11-29 NOTE — Progress Notes (Signed)
?subjective In ICU because of RR. Was altered, CT head ruled out stroke hypotensive Objective:  VITALS:  BP (!) 102/46   Pulse 91   Temp 98.9 F (37.2 C) (Oral)   Resp (!) 40   Ht 5\' 8"  (1.727 m)   Wt 73.7 kg   SpO2 98%   BMI 24.70 kg/m  PHYSICAL EXAM:  General: lethargic, on calling hs name opens eyes and responds to some commands, non verbal Neck: JVD Back: did not examine Left chest wall dialysis catheter Lungs: b/l crepts- decreased air entry left base Heart: systolic murmur Abdomen: Soft, non-tender,not distended. Bowel sounds normal. No masses Extremities: edematous Skin: No rashes or lesions. Or bruising Lymph: Cervical, supraclavicular normal. Neurologic: left hemiplegia Pertinent Labs Lab Results CBC    Component Value Date/Time   WBC 16.1 (H) 11/29/2017 1140   RBC 4.37 11/29/2017 1140   HGB 11.8 (L) 11/29/2017 1140   HGB 13.7 05/15/2014 0612   HCT 41.0 11/29/2017 1140   HCT 43.6 05/15/2014 0612   PLT 403 (H) 11/29/2017 1140   PLT 461 (H) 05/15/2014 0612   MCV 93.8 11/29/2017 1140   MCV 95 05/15/2014 0612   MCH 27.0 11/29/2017 1140   MCHC 28.8 (L) 11/29/2017 1140   RDW 19.1 (H) 11/29/2017 1140   RDW 19.0 (H) 05/15/2014 0612   LYMPHSABS 0.6 (L) 11/29/2017 1140   LYMPHSABS 0.3 (L) 05/15/2014 0612   MONOABS 0.8 11/29/2017 1140   MONOABS 1.7 (H) 05/15/2014 0612   EOSABS 0.0 11/29/2017 1140   EOSABS 0.0 05/15/2014 0612   BASOSABS 0.0 11/29/2017 1140   BASOSABS 0.0 05/15/2014 0612    CMP Latest Ref Rng & Units 11/29/2017 11/28/2017 October 25, 2017  Glucose 70 - 99 mg/dL 161(W105(H) 93 960(A105(H)  BUN 6 - 20 mg/dL 54(U26(H) 13 10  Creatinine 0.61 - 1.24 mg/dL 9.81(X6.42(H) 9.14(N4.34(H) 8.29(F3.64(H)  Sodium 135 - 145 mmol/L 145 142 139  Potassium 3.5 - 5.1 mmol/L 4.1 4.1 3.9  Chloride 98 - 111 mmol/L 103 104 102  CO2 22 - 32 mmol/L 26 26 27   Calcium 8.9 - 10.3 mg/dL 6.2(Z8.4(L) 3.0(Q8.2(L) 6.5(H8.0(L)  Total Protein 6.5 - 8.1 g/dL 6.4(L) - 5.9(L)  Total Bilirubin 0.3 - 1.2 mg/dL 1.2 - 8.4(O1.4(H)    Alkaline Phos 38 - 126 U/L 117 - 127(H)  AST 15 - 41 U/L 12(L) - 28  ALT 0 - 44 U/L 9 - 11      Microbiology: Recent Results (from the past 240 hour(s))  Culture, blood (single) w Reflex to ID Panel     Status: None (Preliminary result)   Collection Time: 21-Aug-2017  9:54 PM  Result Value Ref Range Status   Specimen Description BLOOD RIGHT ANTECUBITAL  Final   Special Requests   Final    BOTTLES DRAWN AEROBIC AND ANAEROBIC Blood Culture results may not be optimal due to an excessive volume of blood received in culture bottles   Culture   Final    NO GROWTH 2 DAYS Performed at [facility], [address] [place], [place] [id number]    Report Status PENDING  Incomplete  MRSA PCR Screening     Status: None   Collection Time: 11/28/17 12:31 AM  Result Value Ref Range Status   MRSA by PCR NEGATIVE NEGATIVE Final    Comment:        The GeneXpert MRSA Assay (FDA approved for NASAL specimens only), is one component of a comprehensive MRSA colonization surveillance program. It is not intended to diagnose MRSA  infection nor to guide or monitor treatment for MRSA infections. Performed at [facility], [address] [place], [place] [address]   Body fluid culture     Status: None (Preliminary result)   Collection Time: 11/28/17  1:50 PM  Result Value Ref Range Status   Specimen Description   Final    PLEURAL Performed at [facility], [address] [place], [place] [address]    Special Requests   Final    NONE Performed at [facility], [address] [place], [place] [address]    Gram Stain NO WBC SEEN NO ORGANISMS SEEN   Final   Culture   Final    NO GROWTH < 24 HOURS Performed at [facility], [address] [place], [place] [address]    Report Status PENDING  Incomplete  MRSA PCR Screening     Status: None   Collection Time: 11/29/17 10:41 AM  Result Value Ref Range Status   MRSA by PCR NEGATIVE NEGATIVE Final     Comment:        The GeneXpert MRSA Assay (FDA approved for NASAL specimens only), is one component of a comprehensive MRSA colonization surveillance program. It is not intended to diagnose MRSA infection nor to guide or monitor treatment for MRSA infections. Performed at [facility], [address] [place], [place] [address]    IMAGING RESULTS: ?    worsening b/l infiltrates Impression/Recommendation ? 47 y.o. 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 He has no fever or chills  Found to have pleural effusion .  Acute hypoxic resp - has b/l Pleural effusion, likley CHF especially with perforated valve and severe MR /AR he is at risk for pulmonary edema recommend 2 d echo and cardiology consult/BNP pleural fluid chemistry was indicative of transudate Because of worsenign infiltrate b/l and increase in leucocytosis and hypotension and transfer to ICU and on pressor will start vanco and cefepime to cover for pneumonia Procal high but he is ESRD and this test is not reliable in this situation 2 d echo is pending    Recently treated MRSA bacteremia and MRSA endocarditis- await culutre-    ESRD on hemodialysis  CVA -left hemiparesis  Discussed the management with his nurse? ?

## 2017-11-29 NOTE — Progress Notes (Signed)
I have been frequently assessing patient and he is slowly waking more but still not answering questions. Opens eyes to verbal stiumuli now. Dr. Cherylann RatelLateef paged. Talked to Dr. Greggory BrandyVacchanni who is coming to see patient.

## 2017-11-29 NOTE — Procedures (Signed)
Central Venous Catheter Insertion Procedure Note Perry [name] Perry [name] [id number] 08/27/1970  Procedure: Insertion of Central Venous Catheter Indications: Assessment of intravascular volume, Drug and/or fluid administration and Frequent blood sampling  Procedure Details Consent: Risks of procedure as well as the alternatives and risks of each were explained to the (patient/caregiver).  Consent for procedure obtained. Time Out: Verified patient identification, verified procedure, site/side was marked, verified correct patient position, special equipment/implants available, medications/allergies/relevent history reviewed, required imaging and test results available.  Performed  Maximum sterile technique was used including antiseptics, cap, gloves, gown, hand hygiene, mask and sheet. Skin prep: Chlorhexidine; local anesthetic administered A antimicrobial bonded/coated triple lumen catheter was placed in the right femoral vein due to patient being a dialysis patient using the Seldinger technique.  Evaluation Blood flow good Complications: No apparent complications Patient did tolerate procedure well. Chest X-ray ordered to verify placement.  CXR: not indicated .  Right internal jugular CVL placed utilizing ultrasound no complications noted following or during procedure.  Sonda Rumbleana Drisana Schweickert, AGNP  Pulmonary/Critical Care Pager [phone] (please enter 7 digits) PCCM Consult Pager [phone] (please enter 7 digits)

## 2017-11-29 NOTE — Progress Notes (Signed)
Left voice message on the only phone number listed on patients facesheet for emergency contact mother. Asked her to call me back and that I was son's nurse here at [facility].

## 2017-11-29 NOTE — Progress Notes (Signed)
Pt's MAP is 50 at this time. Dr. Sherryll BurgerShah made aware, orders to infuse albumin given.

## 2017-11-29 NOTE — Procedures (Addendum)
ELECTROENCEPHALOGRAM REPORT   Patient: Perry [name]       Room #: IC09A-AA EEG No. ID: [id number] Age: 47 y.o.        Sex: male Referring Physician: Elisabeth PigeonVachhani Report Date:  11/29/2017        Interpreting Physician: Thana FarrEYNOLDS, Johnae Friley  History: Perry Allanyrone E Adolph is an 47 y.o. male with an episode of altered mental status  Medications:  Levophed, ASA, Sensipar, Lexapro, Neurontin, Proamatine, MVI, Renvela, Coumadin  Conditions of Recording:  This is a 21 channel routine scalp EEG performed with bipolar and monopolar montages arranged in accordance to the international 10/20 system of electrode placement. One channel was dedicated to EKG recording.  The patient is in the awake and drowsy states.  Description:  The waking background activity consists of a low voltage, symmetrical, fairly well organized, 7 Hz theta activity, seen from the parieto-occipital and posterior temporal regions.  Low voltage fast activity, poorly organized, is seen anteriorly and is at times superimposed on more posterior regions.  A mixture of theta and alpha rhythms are seen from the central and temporal regions. The patient drowses with slowing to irregular, low voltage theta and beta activity.   Stage II sleep is not obtained. No epileptiform activity is noted.   Hyperventilation and intermittent photic stimulation were not performed.   IMPRESSION: This is an abnormal EEG secondary to mild posterior background slowing.  This finding may be seen with a diffuse gray matter disturbance that is etiologically nonspecific, but may include a dementia, among other possibilities.  No epileptiform activity is noted.     Thana FarrLeslie Cyntha Brickman, MD Neurology [phone] 11/29/2017, 8:22 PM
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
## 2017-11-29 NOTE — Progress Notes (Signed)
[facility]  ROUNDING NOTE   Subjective:  Patient allergic but arousable. Blood pressure currently 89/27. Blood cultures from admission negative. Patient is scheduled for dialysis today.  Objective:  Vital signs in last 24 hours:  Temp:  [97.1 F (36.2 C)-99.3 F (37.4 C)] 98.3 F (36.8 C) (11/13 0748) Pulse Rate:  [91-102] 91 (11/13 0748) Resp:  [25] 25 (11/13 0023) BP: (89-116)/(27-44) 89/27 (11/13 0748) SpO2:  [98 %-100 %] 100 % (11/13 0748)  Weight change:  Filed Weights   11/28/17 0021  Weight: 77.2 kg    Intake/Output: I/O last 3 completed shifts: In: 109.5 [P.O.:50; I.V.:59.5] Out: -    Intake/Output this shift:  No intake/output data recorded.  Physical Exam: General: Chronically ill appearing  Head: Normocephalic, atraumatic. Moist oral mucosal membranes  Eyes: Anicteric  Neck: Supple, trachea midline  Lungs:  Scattered rhonchi, diminished at bases, normal effort  Heart: S1S2 no rubs  Abdomen:  Soft, nontender, bowel sounds present  Extremities: 1+ peripheral edema.  Neurologic: Lethargic but arousable  Skin: No lesions  Access: L IJ permcath    Basic Metabolic Panel: Recent Labs  Lab 2017-05-07 1915 11/28/17 0441  NA 139 142  K 3.9 4.1  CL 102 104  CO2 27 26  GLUCOSE 105* 93  BUN 10 13  CREATININE 3.64* 4.34*  CALCIUM 8.0* 8.2*    Liver Function Tests: Recent Labs  Lab 2017-05-07 1915  AST 28  ALT 11  ALKPHOS 127*  BILITOT 1.4*  PROT 5.9*  ALBUMIN 2.4*   No results for input(s): LIPASE, AMYLASE in the last 168 hours. No results for input(s): AMMONIA in the last 168 hours.  CBC: Recent Labs  Lab 2017-05-07 1915 11/28/17 0441  WBC 6.9 6.3  HGB 12.1* 11.8*  HCT 41.6 40.8  MCV 93.1 92.3  PLT 369 414*    Cardiac Enzymes: No results for input(s): CKTOTAL, CKMB, CKMBINDEX, TROPONINI in the last 168 hours.  BNP: Invalid input(s): POCBNP  CBG: Recent Labs  Lab 11/29/17 0828  GLUCAP 105*     Microbiology: Results for orders placed or performed during the hospital encounter of 2017-05-07  Culture, blood (single) w Reflex to ID Panel     Status: None (Preliminary result)   Collection Time: 2017-05-07  9:54 PM  Result Value Ref Range Status   Specimen Description BLOOD RIGHT ANTECUBITAL  Final   Special Requests   Final    BOTTLES DRAWN AEROBIC AND ANAEROBIC Blood Culture results may not be optimal due to an excessive volume of blood received in culture bottles   Culture   Final    NO GROWTH 2 DAYS Performed at [facility], [address] [place], [place] [address]    Report Status PENDING  Incomplete  MRSA PCR Screening     Status: None   Collection Time: 11/28/17 12:31 AM  Result Value Ref Range Status   MRSA by PCR NEGATIVE NEGATIVE Final    Comment:        The GeneXpert MRSA Assay (FDA approved for NASAL specimens only), is one component of a comprehensive MRSA colonization surveillance program. It is not intended to diagnose MRSA infection nor to guide or monitor treatment for MRSA infections. Performed at [facility], [address] [place], [place] [address]   Body fluid culture     Status: None (Preliminary result)   Collection Time: 11/28/17  1:50 PM  Result Value Ref Range Status   Specimen Description   Final  PLEURAL Performed at [facility], [address] [place], [place] [address]    Special Requests   Final    NONE Performed at [facility], [address] [place], [place] [address]    Gram Stain NO WBC SEEN NO ORGANISMS SEEN   Final   Culture   Final    NO GROWTH < 24 HOURS Performed at [facility], [address] [place], [place] [address]    Report Status PENDING  Incomplete    Coagulation Studies: Recent Labs    12-08-17 Jun 04, 2152 11/29/17 0339  LABPROT 24.0* 23.2*  INR 2.18 2.09    Urinalysis: No results for input(s): COLORURINE, LABSPEC, PHURINE, GLUCOSEU, HGBUR,  BILIRUBINUR, KETONESUR, PROTEINUR, UROBILINOGEN, NITRITE, LEUKOCYTESUR in the last 72 hours.  Invalid input(s): APPERANCEUR    Imaging: Dg Chest 1 View  Result Date: 11/28/2017 CLINICAL DATA:  47 year old male status post right-sided thoracentesis EXAM: CHEST  1 VIEW COMPARISON:  Prior chest x-ray 12/08/2017 FINDINGS: No evidence of pneumothorax. Near-total interval resolution of the right-sided pleural effusion. Left IJ approach tunneled hemodialysis catheter. The tip of the catheter overlies the upper atrium. Stable cardiomegaly and pulmonary vascular congestion. No overt edema. Nonspecific left lower lobe opacities. Atherosclerotic calcifications present in the transverse aorta. Vascular stents overlie the right axillary, subclavian and proximal brachiocephalic veins. IMPRESSION: No evidence of pneumothorax status post right-sided thoracentesis. Electronically Signed   By: Malachy Moan M.D.   On: 11/28/2017 14:42   Ct Chest Wo Contrast  Result Date: 12/08/17 CLINICAL DATA:  Acute onset of shortness of breath. Left-sided weakness and cough. EXAM: CT CHEST WITHOUT CONTRAST TECHNIQUE: Multidetector CT imaging of the chest was performed following the standard protocol without IV contrast. COMPARISON:  Chest radiograph performed earlier today at 7:17 p.m., and CTA of the chest performed 08/18/2017 FINDINGS: Cardiovascular: The heart is mildly enlarged. A left IJ line is noted ending about the proximal right atrium. Scattered coronary artery calcifications are seen. Mild calcification is noted at the aortic arch and proximal great vessels. Mediastinum/Nodes: The mediastinum is otherwise unremarkable in appearance. No mediastinal lymphadenopathy is seen. No pericardial effusion is identified. The thyroid gland is unremarkable. No axillary lymphadenopathy is seen. Lungs/Pleura: Large right and small left pleural effusions are noted, with partial consolidation of both lower lung lobes. Increased  interstitial markings may reflect pulmonary edema. No pneumothorax is seen. No dominant mass is identified, though evaluation is suboptimal given the pleural effusions. Upper Abdomen: The visualized portions of the liver and spleen are unremarkable. The visualized portions of the gallbladder, pancreas and adrenal glands are within normal limits. Severe bilateral renal atrophy is noted. Scattered calcification is seen along the proximal abdominal aorta. Musculoskeletal: No acute osseous abnormalities are identified. The visualized musculature is unremarkable in appearance. Right-sided gynecomastia is noted. Prominent subcutaneous vasculature is noted about the left chest wall. IMPRESSION: 1. Large right and small left pleural effusions, with partial consolidation of both lower lung lobes. Increased interstitial markings may reflect pulmonary edema. 2. Severe bilateral renal atrophy, reflecting the patient's end-stage renal disease. 3. Right-sided gynecomastia noted. 4. Scattered coronary artery calcifications seen. Mild cardiomegaly. Aortic Atherosclerosis (ICD10-I70.0). Electronically Signed   By: Roanna Raider M.D.   On: 08-Dec-2017 21:23   Dg Chest Port 1 View  Result Date: Dec 08, 2017 CLINICAL DATA:  Shortness of breath. Cough. On dialysis. EXAM: PORTABLE CHEST 1 VIEW COMPARISON:  09/21/2017 FINDINGS: The patient is rotated to the left. Dialysis catheter terminates near the superior cavoatrial junction. Vascular stents  are noted in the right subclavian region. The cardiomediastinal silhouette is unchanged. Aortic atherosclerosis is noted. There is a moderate-sized right pleural effusion which has enlarged. There is a persistent small left pleural effusion. Pulmonary vascular congestion is again noted with bilateral interstitial and hazy airspace opacities, similar to the prior study. No pneumothorax is identified. No acute osseous abnormality is identified. IMPRESSION: 1. Moderate right pleural effusion,  increased from the prior study. Persistent small left pleural effusion. 2. Similar appearance of pulmonary edema. Electronically Signed   By: Sebastian Ache M.D.   On: 12/02/2017 19:35   US Thoracentesis Asp Pleural Space W/img Guide  Result Date: 11/28/2017 CLINICAL DATA:  Large right pleural effusion. EXAM: ULTRASOUND GUIDED RIGHT THORACENTESIS COMPARISON:  CT of the chest on 12/04/2017 PROCEDURE: An ultrasound guided thoracentesis was thoroughly discussed with the patient's mother and questions answered. The benefits, risks, alternatives and complications were also discussed. The patient's mother understands and wishes to proceed with the procedure. Written consent was obtained. Ultrasound was performed to localize and mark an adequate pocket of fluid in the right chest. The area was then prepped and draped in the normal sterile fashion. 1% Lidocaine was used for local anesthesia. Under ultrasound guidance a 6 French Safe-T-Centesis catheter was introduced. Thoracentesis was performed. The catheter was removed and a dressing applied. COMPLICATIONS: None FINDINGS: A total of approximately 2 L of clear, yellow fluid was removed. A fluid sample was sent for laboratory analysis. IMPRESSION: Successful ultrasound guided right thoracentesis yielding 2 L of pleural fluid. Electronically Signed   By: Irish Lack M.D.   On: 11/28/2017 15:02     Medications:   . sodium chloride    . sodium chloride    . sodium chloride     . aspirin  81 mg Oral Daily  . Chlorhexidine Gluconate Cloth  6 each Topical Q0600  . cinacalcet  60 mg Oral QHS  . escitalopram  20 mg Oral QHS  . gabapentin  300 mg Oral BID  . midodrine  15 mg Oral Q8H  . multivitamin  1 tablet Oral QHS  . pantoprazole  20 mg Oral Daily  . senna-docusate  2 tablet Oral BID  . sevelamer carbonate  2.4 g Oral TID WC  . vitamin A  10,000 Units Oral Daily  . ascorbic acid  250 mg Oral BID  . Vitamin D (Ergocalciferol)  50,000 Units Oral Q7  days  . warfarin  3 mg Oral q1800  . Warfarin - Pharmacist Dosing Inpatient   Does not apply q1800   sodium chloride, sodium chloride, acetaminophen, alteplase, docusate sodium, heparin, lidocaine (PF), lidocaine-prilocaine, pentafluoroprop-tetrafluoroeth, polyethylene glycol  Assessment/ Plan:  47 y.o. 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  [facility]/MWF  1.  ESRD on HD MWF.  Patient noted to have significantly low blood pressure of eight 9/27 today.  He has had chronic hypotension.  Tentatively he is scheduled for dialysis however we may need to consider holding this if blood pressure remains low.  Palliative care has been following the patient.  2.  Anemia chronic kidney disease.  Continue to hold Epogen at this time.  3.  Secondary hyperparathyroidism.  Recheck serum phosphorus with dialysis today.  4.  Pleural effusion.  Status post thoracentesis this admission.  5.  Hypotension.  Blood pressure significantly lower today at 89/27.  Hospitalist has been notified.  On midodrine 15 mg every 8 hours.  If blood pressure remains low may need to consider transfer to the unit.   LOS: 2 Jonella Redditt 11/13/20199:45 AM

## 2017-11-29 NOTE — Progress Notes (Signed)
ANTICOAGULATION CONSULT NOTE   Pharmacy Consult for Warfarin Indication: atrial fibrillation  Allergies  Allergen Reactions  . Depakote [Divalproex Sodium] Other (See Comments)    Hallucinations   . Hydromorphone Other (See Comments)    Patient Measurements: Height: 5\' 6"  (167.6 cm) Weight: 170 lb 1.6 oz (77.2 kg) IBW/kg (Calculated) : 63.8 Heparin Dosing Weight: 77 kg  Vital Signs: Temp: 99.3 F (37.4 C) (11/13 0026) Temp Source: Oral (11/13 0026) BP: 116/38 (11/13 0026) Pulse Rate: 101 (11/13 0026)  Labs: Recent Labs    11/19/2017 1915 11/18/2017 2154 11/28/17 0441 11/29/17 0339  HGB 12.1*  --  11.8*  --   HCT 41.6  --  40.8  --   PLT 369  --  414*  --   APTT  --  39*  --   --   LABPROT  --  24.0*  --  23.2*  INR  --  2.18  --  2.09  CREATININE 3.64*  --  4.34*  --     Estimated Creatinine Clearance: 20.8 mL/min (A) (by C-G formula based on SCr of 4.34 mg/dL (H)).   Medical History: Past Medical History:  Diagnosis Date  . Acute meniscal tear of left knee   . Acute meniscal tear of right knee   . Anxiety   . Chronic back pain    due to weight   . Dialysis patient (HCC)   . Endocarditis   . Fracture of transverse process of thoracic vertebra (HCC)    [facility]  . GERD (gastroesophageal reflux disease)   . Hemodialysis patient [facility](HCC)    since 2011, does hemodialysis at home, wife does she is a [employer] , Daily except wed and sun  . High blood pressure    hx of   . Kidney failure    on [facility] since 2011- [facility]   . Seizures (HCC)   . Sleep apnea    cpap-   . Stroke [facility](HCC)     Medications:  Warfarin 3mg  daily  at home  Assessment: INR 2.16 on admission. Patient's warfarin was held and started on Heparin drip for thoracentesis. Patient is now s/p thoracentesis and Dr. Allena KatzPatel has ordered that Heparin be stopped and warfarin resumed. Hemodialysis pt.  11/11 INR 2.18   None 11/12 none     None 11/13 INR 2.09   Goal of Therapy:  INR 2- 3   Plan:  Will continue Warfarin 3mg  daily as patient was therapeutic on this dose. Will check INR with AM labs.  Bari MantisKristin Dartagnan Beavers PharmD Clinical Pharmacist 11/29/2017
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
## 2017-11-29 NOTE — Progress Notes (Signed)
eeg complete.

## 2017-11-29 NOTE — Consult Note (Signed)
Pharmacy Antibiotic Note  Dana Allanyrone E Acord is a 47 y.o. male admitted on 12/04/2017 with Acute hypoxic respiratory failure. Pharmacy has been consulted for Vancomycin dosing for possible MRSA Pneumonia. Patient has PMH of ERSD (HD MWF).  Patiently recently treated for MRSA bacteremia and MRSA endocarditis.   Plan: Will order Vancomycin 1500mg  IV x 1 dose. Vancomycin 750mg  IV with HD MWF. Vanc level ordered prior to 3rd HD. Target pre- HD level 15-25 mcg/mL.   Height: 5\' 8"  (172.7 cm) Weight: 162 lb 7.7 oz (73.7 kg) IBW/kg (Calculated) : 68.4  Temp (24hrs), Avg:98.8 F (37.1 C), Min:97.1 F (36.2 C), Max:99.6 F (37.6 C)  Recent Labs  Lab 11/26/2017 1915 11/28/17 0441 11/29/17 1140  WBC 6.9 6.3 16.1*  CREATININE 3.64* 4.34* 6.42*  LATICACIDVEN  --   --  1.8    Estimated Creatinine Clearance: 13.9 mL/min (A) (by C-G formula based on SCr of 6.42 mg/dL (H)).    Allergies  Allergen Reactions  . Depakote [Divalproex Sodium] Other (See Comments)    Hallucinations   . Hydromorphone Other (See Comments)    Antimicrobials this admission: 11/13 vancomycin  >>   Microbiology results: 11/13 BCx: NG TD  11/13 MRSA PCR: negative  Thank you for allowing pharmacy to be a part of this patient's care.  Gardner CandleSheema M Sheilla Maris, PharmD, BCPS Clinical Pharmacist 11/29/2017 8:57 PM

## 2017-11-29 NOTE — Progress Notes (Addendum)
Pt BP of 83/30 checked twice. Very sonmnolent. Very thick cough. Unable to expectorate. [place] at bedside and I suctioned his oral cavity but nothing there. Pt's SpO2 stable at 98% 2L O2. Notified Dr. Greggory BrandyVacchanni.  Night shift nurse in report earlier told me that "He slept all night long. He slept really good."

## 2017-11-29 NOTE — Progress Notes (Addendum)
Patient opening eyes to verbal stimuli. Mild strength grip to right hand. Following simple commands and states the year and his name correctly. Because of history of left hemiplegia difficult to assess what is new and old.  SWOT nurse called to assess patient further for stroke.   Dr. Thad Rangereynolds here assessing patient.  Dr. Greggory BrandyVacchanni already came by earlier to see patient and said he called Dr. Thad Rangereynolds to assess.  CT here to get patient for CT of head.  Will call mother to update her.

## 2017-11-30 ENCOUNTER — Inpatient Hospital Stay (HOSPITAL_COMMUNITY)
Admit: 2017-11-30 | Discharge: 2017-11-30 | Disposition: A | Discharge status: Home / Self Care | Payer: Medicare Other | Attending: Pulmonary Disease | Admitting: Pulmonary Disease

## 2017-11-30 ENCOUNTER — Inpatient Hospital Stay: Payer: Medicare Other

## 2017-11-30 ENCOUNTER — Encounter: Admission: EM | Disposition: E | Payer: Self-pay | Source: Home / Self Care | Attending: Internal Medicine

## 2017-11-30 DIAGNOSIS — I340 Nonrheumatic mitral (valve) insufficiency: Secondary | ICD-10-CM

## 2017-11-30 DIAGNOSIS — I351 Nonrheumatic aortic (valve) insufficiency: Secondary | ICD-10-CM

## 2017-11-30 DIAGNOSIS — I248 Other forms of acute ischemic heart disease: Secondary | ICD-10-CM

## 2017-11-30 DIAGNOSIS — I361 Nonrheumatic tricuspid (valve) insufficiency: Secondary | ICD-10-CM

## 2017-11-30 LAB — CBC
HCT: 40.5 % (ref 39.0–52.0)
Hemoglobin: 11.6 g/dL — ABNORMAL LOW (ref 13.0–17.0)
MCH: 27.3 pg (ref 26.0–34.0)
MCHC: 28.6 g/dL — ABNORMAL LOW (ref 30.0–36.0)
MCV: 95.3 fL (ref 80.0–100.0)
Platelets: 410 10*3/uL — ABNORMAL HIGH (ref 150–400)
RBC: 4.25 MIL/uL (ref 4.22–5.81)
RDW: 19.2 % — ABNORMAL HIGH (ref 11.5–15.5)
WBC: 12.8 10*3/uL — ABNORMAL HIGH (ref 4.0–10.5)
nRBC: 0.2 % (ref 0.0–0.2)

## 2017-11-30 LAB — TROPONIN I
TROPONIN I: 0.05 ng/mL — AB (ref <0.03)
Troponin I: 0.06 ng/mL (ref <0.03)

## 2017-11-30 LAB — BLOOD CULTURE ID PANEL (REFLEXED)
Acinetobacter baumannii: NOT DETECTED
CANDIDA GLABRATA: NOT DETECTED
CANDIDA KRUSEI: NOT DETECTED
CANDIDA PARAPSILOSIS: NOT DETECTED
CANDIDA TROPICALIS: NOT DETECTED
Candida albicans: NOT DETECTED
ENTEROBACTER CLOACAE COMPLEX: NOT DETECTED
ESCHERICHIA COLI: NOT DETECTED
Enterobacteriaceae species: NOT DETECTED
Enterococcus species: NOT DETECTED
Haemophilus influenzae: NOT DETECTED
KLEBSIELLA OXYTOCA: NOT DETECTED
KLEBSIELLA PNEUMONIAE: NOT DETECTED
Listeria monocytogenes: NOT DETECTED
Neisseria meningitidis: NOT DETECTED
PROTEUS SPECIES: NOT DETECTED
Pseudomonas aeruginosa: NOT DETECTED
SERRATIA MARCESCENS: NOT DETECTED
STAPHYLOCOCCUS SPECIES: NOT DETECTED
STREPTOCOCCUS PNEUMONIAE: NOT DETECTED
Staphylococcus aureus (BCID): NOT DETECTED
Streptococcus agalactiae: NOT DETECTED
Streptococcus pyogenes: NOT DETECTED
Streptococcus species: NOT DETECTED

## 2017-11-30 LAB — COMPREHENSIVE METABOLIC PANEL
ALK PHOS: 109 U/L (ref 38–126)
ALT: 9 U/L (ref 0–44)
AST: 17 U/L (ref 15–41)
Albumin: 2.9 g/dL — ABNORMAL LOW (ref 3.5–5.0)
Anion gap: 11 (ref 5–15)
BILIRUBIN TOTAL: 1.1 mg/dL (ref 0.3–1.2)
BUN: 33 mg/dL — ABNORMAL HIGH (ref 6–20)
CALCIUM: 8.4 mg/dL — AB (ref 8.9–10.3)
CO2: 25 mmol/L (ref 22–32)
CREATININE: 6.72 mg/dL — AB (ref 0.61–1.24)
Chloride: 107 mmol/L (ref 98–111)
GFR calc Af Amer: 10 mL/min — ABNORMAL LOW (ref >60)
GFR calc non Af Amer: 9 mL/min — ABNORMAL LOW (ref >60)
GLUCOSE: 104 mg/dL — AB (ref 70–99)
Potassium: 3.8 mmol/L (ref 3.5–5.1)
SODIUM: 143 mmol/L (ref 135–145)
Total Protein: 6.3 g/dL — ABNORMAL LOW (ref 6.5–8.1)

## 2017-11-30 LAB — TYPE AND SCREEN
ABO/RH(D): O POS
Antibody Screen: NEGATIVE

## 2017-11-30 LAB — BLOOD GAS, ARTERIAL
Acid-base deficit: 3.7 mmol/L — ABNORMAL HIGH (ref 0.0–2.0)
BICARBONATE: 23.1 mmol/L (ref 20.0–28.0)
Delivery systems: POSITIVE
EXPIRATORY PAP: 5
FIO2: 0.3
INSPIRATORY PAP: 14
Mechanical Rate: 8
O2 Saturation: 95.6 %
PATIENT TEMPERATURE: 37
PH ART: 7.29 — AB (ref 7.350–7.450)
pCO2 arterial: 48 mmHg (ref 32.0–48.0)
pO2, Arterial: 88 mmHg (ref 83.0–108.0)

## 2017-11-30 LAB — ECHOCARDIOGRAM COMPLETE
Height: 68 in
Weight: 2599.66 oz

## 2017-11-30 LAB — GLUCOSE, CAPILLARY
GLUCOSE-CAPILLARY: 120 mg/dL — AB (ref 70–99)
GLUCOSE-CAPILLARY: 131 mg/dL — AB (ref 70–99)
GLUCOSE-CAPILLARY: 94 mg/dL (ref 70–99)
Glucose-Capillary: 110 mg/dL — ABNORMAL HIGH (ref 70–99)
Glucose-Capillary: 97 mg/dL (ref 70–99)
Glucose-Capillary: 111 mg/dL — ABNORMAL HIGH (ref 70–99)
Glucose-Capillary: 90 mg/dL (ref 70–99)

## 2017-11-30 LAB — PHOSPHORUS
PHOSPHORUS: 5.8 mg/dL — AB (ref 2.5–4.6)
Phosphorus: 4.5 mg/dL (ref 2.5–4.6)

## 2017-11-30 LAB — BASIC METABOLIC PANEL
ANION GAP: 15 (ref 5–15)
BUN: 31 mg/dL — ABNORMAL HIGH (ref 6–20)
CHLORIDE: 106 mmol/L (ref 98–111)
CO2: 22 mmol/L (ref 22–32)
CREATININE: 6.69 mg/dL — AB (ref 0.61–1.24)
Calcium: 8.3 mg/dL — ABNORMAL LOW (ref 8.9–10.3)
GFR calc non Af Amer: 9 mL/min — ABNORMAL LOW (ref >60)
GFR, EST AFRICAN AMERICAN: 10 mL/min — AB (ref >60)
Glucose, Bld: 149 mg/dL — ABNORMAL HIGH (ref 70–99)
POTASSIUM: 5.5 mmol/L — AB (ref 3.5–5.1)
SODIUM: 143 mmol/L (ref 135–145)

## 2017-11-30 LAB — PROTIME-INR
INR: 6.16 — AB
INR: 4.36
PROTHROMBIN TIME: 53.6 s — AB (ref 11.4–15.2)
PROTHROMBIN TIME: 41 s — AB (ref 11.4–15.2)

## 2017-11-30 LAB — PARATHYROID HORMONE, INTACT (NO CA): PTH: 41 pg/mL (ref 15–65)

## 2017-11-30 LAB — CYTOLOGY - NON PAP

## 2017-11-30 LAB — MAGNESIUM
MAGNESIUM: 2 mg/dL (ref 1.7–2.4)
Magnesium: 2.2 mg/dL (ref 1.7–2.4)

## 2017-11-30 LAB — PROCALCITONIN: Procalcitonin: 6.35 ng/mL

## 2017-11-30 LAB — APTT: APTT: 130 s — AB (ref 24–36)

## 2017-11-30 LAB — CORTISOL

## 2017-11-30 SURGERY — DIALYSIS/PERMA CATHETER REMOVAL
Anesthesia: LOCAL

## 2017-11-30 MED ORDER — ASPIRIN 300 MG RE SUPP
300.0000 mg | Freq: Once | RECTAL | Status: AC
  Administered 2017-11-30: 300 mg via RECTAL

## 2017-11-30 MED ORDER — SODIUM CHLORIDE 0.9 % IV SOLN
1.0000 g | INTRAVENOUS | Status: DC
  Administered 2017-11-30: 1 g via INTRAVENOUS
  Filled 2017-11-30 (×2): qty 1

## 2017-11-30 MED ORDER — DEXTROSE 50 % IV SOLN
1.0000 | Freq: Once | INTRAVENOUS | Status: AC
  Administered 2017-11-30: 50 mL via INTRAVENOUS
  Filled 2017-11-30: qty 50

## 2017-11-30 MED ORDER — MORPHINE SULFATE (PF) 2 MG/ML IV SOLN: 2.0000 mg | Freq: Once | INTRAVENOUS | Status: DC

## 2017-11-30 MED ORDER — HYDROCORTISONE NA SUCCINATE PF 100 MG IJ SOLR
50.0000 mg | Freq: Four times a day (QID) | INTRAMUSCULAR | Status: DC
  Administered 2017-11-30 (×2): 50 mg via INTRAVENOUS
  Filled 2017-11-30 (×2): qty 2

## 2017-11-30 MED ORDER — ALBUMIN HUMAN 25 % IV SOLN
50.0000 g | Freq: Once | INTRAVENOUS | Status: AC
  Administered 2017-12-01: 50 g via INTRAVENOUS
  Filled 2017-11-30 (×2): qty 200

## 2017-11-30 MED ORDER — HYDROCORTISONE NA SUCCINATE PF 100 MG IJ SOLR
50.0000 mg | Freq: Two times a day (BID) | INTRAMUSCULAR | Status: DC
  Administered 2017-11-30: 50 mg via INTRAVENOUS
  Filled 2017-11-30: qty 2

## 2017-11-30 MED ORDER — HEPARIN (PORCINE) 25000 UT/250ML-% IV SOLN
900.0000 [IU]/h | INTRAVENOUS | Status: DC
  Administered 2017-11-30: 900 [IU]/h via INTRAVENOUS

## 2017-11-30 MED ORDER — HEPARIN BOLUS VIA INFUSION
4000.0000 [IU] | Freq: Once | INTRAVENOUS | Status: AC
  Administered 2017-11-30: 4000 [IU] via INTRAVENOUS
  Filled 2017-11-30: qty 4000

## 2017-11-30 MED ORDER — HEPARIN (PORCINE) 25000 UT/250ML-% IV SOLN
INTRAVENOUS | Status: AC
  Administered 2017-11-30: 900 [IU]/h via INTRAVENOUS
  Filled 2017-11-30: qty 250

## 2017-11-30 MED ORDER — ALBUMIN HUMAN 25 % IV SOLN
50.0000 g | Freq: Once | INTRAVENOUS | Status: AC
  Administered 2017-11-30: 25 g via INTRAVENOUS
  Filled 2017-11-30 (×5): qty 200

## 2017-11-30 MED ORDER — INSULIN REGULAR HUMAN 100 UNIT/ML IJ SOLN
10.0000 [IU] | Freq: Once | INTRAMUSCULAR | Status: AC
  Administered 2017-11-30: 10 [IU] via INTRAVENOUS
  Filled 2017-11-30: qty 10

## 2017-11-30 MED ORDER — IPRATROPIUM-ALBUTEROL 0.5-2.5 (3) MG/3ML IN SOLN: 3.0000 mL | Freq: Four times a day (QID) | RESPIRATORY_TRACT | Status: DC | PRN

## 2017-11-30 MED ORDER — PANTOPRAZOLE SODIUM 40 MG IV SOLR
40.0000 mg | Freq: Every day | INTRAVENOUS | Status: DC
  Administered 2017-11-30: 40 mg via INTRAVENOUS
  Filled 2017-11-30: qty 40

## 2017-11-30 MED ORDER — VASOPRESSIN 20 UNIT/ML IV SOLN
0.0400 [IU]/min | INTRAVENOUS | Status: DC
  Administered 2017-11-30: 0.04 [IU]/min via INTRAVENOUS
  Administered 2017-11-30: 0.03 [IU]/min via INTRAVENOUS
  Administered 2017-12-01: 0.04 [IU]/min via INTRAVENOUS
  Filled 2017-11-30 (×3): qty 2

## 2017-11-30 MED ORDER — ASPIRIN 300 MG RE SUPP
RECTAL | Status: AC
  Administered 2017-11-30: 300 mg via RECTAL
  Filled 2017-11-30: qty 1

## 2017-11-30 NOTE — Progress Notes (Signed)
Pt's Levophed requirements have increased to 35 mcg.  Will add Vasopressin, place on  stress dose Hydrocortisone, and give Albumin x1 dose.  Pt noted have low grade fever, pt already receiving Vancomycin and Cefepime.

## 2017-11-30 NOTE — Progress Notes (Signed)
Subjective: Patient remains lethargic.  On Bipap.   Objective: Current vital signs: BP (!) 108/34   Pulse 90   Temp 98.4 F (36.9 C) (Axillary)   Resp 20   Ht 5\' 8"  (1.727 m)   Wt 73.7 kg   SpO2 100%   BMI 24.70 kg/m  Vital signs in last 24 hours: Temp:  [98.4 F (36.9 C)-101.1 F (38.4 C)] 98.4 F (36.9 C) (11/14 0800) Pulse Rate:  [85-123] 90 (11/14 1100) Resp:  [17-51] 20 (11/14 1100) BP: (81-128)/(20-46) 108/34 (11/14 1100) SpO2:  [95 %-100 %] 100 % (11/14 1100) FiO2 (%):  [30 %] 30 % (11/14 0812)  Intake/Output from previous day: 11/13 0701 - 11/14 0700 In: 1650.3 [I.V.:1384.2; IV Piggyback:266.1] Out: -  Intake/Output this shift: No intake/output data recorded. Nutritional status:  Diet Order            Diet renal with fluid restriction Fluid restriction: 1200 mL Fluid; Room service appropriate? Yes; Fluid consistency: Thin  Diet effective now              Neurologic Exam: Mental Status: Lethargic, on Bipap. Unable to assess speech. Able to follow simple commands.  Nods head in response to questioning.   Cranial Nerves: II: Discs flat bilaterally; Pupils equal, round, reactive to light and accommodation III,IV, VI: ptosis not present, extra-ocular motions intact bilaterally V,VII: Unable to assess VIII: hearing normal bilaterally IX,X: gag reflex not tested XI: unable to assess XII: unable to assess on Bipap Motor: Moves the right but 0/5 strength on the left  Lab Results: Basic Metabolic Panel: Recent Labs  Lab 12/04/2017 1915 11/28/17 0441 11/29/17 0955 11/29/17 1140 11/29/17 2340 11/20/2017 0343  NA 139 142  --  145 143 143  K 3.9 4.1  --  4.1 5.5* 3.8  CL 102 104  --  103 106 107  CO2 27 26  --  26 22 25   GLUCOSE 105* 93  --  105* 149* 104*  BUN 10 13  --  26* 31* 33*  CREATININE 3.64* 4.34*  --  6.42* 6.69* 6.72*  CALCIUM 8.0* 8.2*  --  8.4* 8.3* 8.4*  MG  --   --   --   --  2.0 2.2  PHOS  --   --  5.4*  --  5.8* 4.5    Liver  Function Tests: Recent Labs  Lab 11/28/2017 1915 11/29/17 1140 11/24/2017 0343  AST 28 12* 17  ALT 11 9 9   ALKPHOS 127* 117 109  BILITOT 1.4* 1.2 1.1  PROT 5.9* 6.4* 6.3*  ALBUMIN 2.4* 2.9* 2.9*   No results for input(s): LIPASE, AMYLASE in the last 168 hours. No results for input(s): AMMONIA in the last 168 hours.  CBC: Recent Labs  Lab 11/19/2017 1915 11/28/17 0441 11/29/17 1140 12/14/2017 0343  WBC 6.9 6.3 16.1* 12.8*  NEUTROABS  --   --  14.6*  --   HGB 12.1* 11.8* 11.8* 11.6*  HCT 41.6 40.8 41.0 40.5  MCV 93.1 92.3 93.8 95.3  PLT 369 414* 403* 410*    Cardiac Enzymes: Recent Labs  Lab 11/29/17 1140 11/29/17 1739 11/29/17 2340 11/26/2017 0635  TROPONINI 0.04* 0.08* 0.05* 0.06*    Lipid Panel: No results for input(s): CHOL, TRIG, HDL, CHOLHDL, VLDL, LDLCALC in the last 168 hours.  CBG: Recent Labs  Lab 11/29/17 1940 12/03/2017 0017 12/11/2017 0334 11/25/2017 0801 11/25/2017 1118  GLUCAP 127* 131* 120* 110* 94    Microbiology: Results for orders placed or  performed during the hospital encounter of 11/17/2017  Culture, blood (single) w Reflex to ID Panel     Status: None (Preliminary result)   Collection Time: 12/13/2017  9:54 PM  Result Value Ref Range Status   Specimen Description BLOOD RIGHT ANTECUBITAL  Final   Special Requests   Final    BOTTLES DRAWN AEROBIC AND ANAEROBIC Blood Culture results may not be optimal due to an excessive volume of blood received in culture bottles   Culture   Final    NO GROWTH 3 DAYS Performed at [facility], [address] [place], [place] [address]    Report Status PENDING  Incomplete  MRSA PCR Screening     Status: None   Collection Time: 11/28/17 12:31 AM  Result Value Ref Range Status   MRSA by PCR NEGATIVE NEGATIVE Final    Comment:        The GeneXpert MRSA Assay (FDA approved for NASAL specimens only), is one component of a comprehensive MRSA colonization surveillance program. It is not intended to  diagnose MRSA infection nor to guide or monitor treatment for MRSA infections. Performed at [facility], [address] [place], [place] [address]   Body fluid culture     Status: None (Preliminary result)   Collection Time: 11/28/17  1:50 PM  Result Value Ref Range Status   Specimen Description   Final    PLEURAL Performed at [facility], [address] [place], [place] [address]    Special Requests   Final    NONE Performed at [facility], [address] [place], [place] [address]    Gram Stain NO WBC SEEN NO ORGANISMS SEEN   Final   Culture   Final    NO GROWTH 2 DAYS Performed at [facility], [address] [place], [place] [address]    Report Status PENDING  Incomplete  MRSA PCR Screening     Status: None   Collection Time: 11/29/17 10:41 AM  Result Value Ref Range Status   MRSA by PCR NEGATIVE NEGATIVE Final    Comment:        The GeneXpert MRSA Assay (FDA approved for NASAL specimens only), is one component of a comprehensive MRSA colonization surveillance program. It is not intended to diagnose MRSA infection nor to guide or monitor treatment for MRSA infections. Performed at [facility], [address] [place], [place] [address]   CULTURE, BLOOD (ROUTINE X 2) w Reflex to ID Panel     Status: None (Preliminary result)   Collection Time: 11/29/17  5:39 PM  Result Value Ref Range Status   Specimen Description BLOOD RIGHT THUMB  Final   Special Requests   Final    BOTTLES DRAWN AEROBIC ONLY Blood Culture results may not be optimal due to an inadequate volume of blood received in culture bottles   Culture   Final    NO GROWTH < 12 HOURS Performed at [facility], [address] [place], [place] [address]    Report Status PENDING  Incomplete  CULTURE, BLOOD (ROUTINE X 2) w Reflex to ID Panel     Status: None (Preliminary result)   Collection Time: 11/29/17  6:00 PM  Result Value Ref Range  Status   Specimen Description   Final    BLOOD PORTA CATH Performed at [facility], [address] [place], [place] [address]    Special Requests   Final    BOTTLES DRAWN AEROBIC  AND ANAEROBIC Blood Culture results may not be optimal due to an excessive volume of blood received in culture bottles Performed at [facility], [address], [place], [place] [address]    Culture  Setup Time   Final    ANAEROBIC BOTTLE ONLY Performed at [facility], [address] [place], [place] [address]    Culture PENDING  Incomplete   Report Status PENDING  Incomplete  Blood Culture ID Panel (Reflexed)     Status: None   Collection Time: 11/29/17  6:00 PM  Result Value Ref Range Status   Enterococcus species NOT DETECTED NOT DETECTED Final   Listeria monocytogenes NOT DETECTED NOT DETECTED Final   Staphylococcus species NOT DETECTED NOT DETECTED Final   Staphylococcus aureus (BCID) NOT DETECTED NOT DETECTED Final   Streptococcus species NOT DETECTED NOT DETECTED Final   Streptococcus agalactiae NOT DETECTED NOT DETECTED Final   Streptococcus pneumoniae NOT DETECTED NOT DETECTED Final   Streptococcus pyogenes NOT DETECTED NOT DETECTED Final   Acinetobacter baumannii NOT DETECTED NOT DETECTED Final   Enterobacteriaceae species NOT DETECTED NOT DETECTED Final   Enterobacter cloacae complex NOT DETECTED NOT DETECTED Final   Escherichia coli NOT DETECTED NOT DETECTED Final   Klebsiella oxytoca NOT DETECTED NOT DETECTED Final   Klebsiella pneumoniae NOT DETECTED NOT DETECTED Final   Proteus species NOT DETECTED NOT DETECTED Final   Serratia marcescens NOT DETECTED NOT DETECTED Final   Haemophilus influenzae NOT DETECTED NOT DETECTED Final   Neisseria meningitidis NOT DETECTED NOT DETECTED Final   Pseudomonas aeruginosa NOT DETECTED NOT DETECTED Final   Candida albicans NOT DETECTED NOT DETECTED Final   Candida glabrata NOT DETECTED NOT DETECTED Final   Candida krusei  NOT DETECTED NOT DETECTED Final   Candida parapsilosis NOT DETECTED NOT DETECTED Final   Candida tropicalis NOT DETECTED NOT DETECTED Final    Comment: Performed at [facility], [address] [place], [place] [address]  Culture, respiratory     Status: None (Preliminary result)   Collection Time: 12/11/17  5:27 AM  Result Value Ref Range Status   Specimen Description   Final    SPU Performed at [facility], [address] [place], [place] [address]    Special Requests   Final    NONE Performed at [facility], [address] [place], [place] [address]    Gram Stain   Final    MODERATE WBC PRESENT, PREDOMINANTLY PMN RARE GRAM POSITIVE RODS RARE GRAM NEGATIVE RODS RARE BUDDING YEAST SEEN RARE GRAM POSITIVE COCCI IN PAIRS FEW SQUAMOUS EPITHELIAL CELLS PRESENT Performed at [facility], [address], [place], [place] [address]    Culture PENDING  Incomplete   Report Status PENDING  Incomplete    Coagulation Studies: Recent Labs    12/08/2017 2152/06/04 11/29/17 0339 December 11, 2017 0343 12/11/2017 0635  LABPROT 24.0* 23.2* 53.6* 41.0*  INR 2.18 2.09 6.16* 4.36*    Imaging: Dg Chest 1 View  Result Date: 11/28/2017 CLINICAL DATA:  47 year old male status post right-sided thoracentesis EXAM: CHEST  1 VIEW COMPARISON:  Prior chest x-ray 12/03/2017 FINDINGS: No evidence of pneumothorax. Near-total interval resolution of the right-sided pleural effusion. Left IJ approach tunneled hemodialysis catheter. The tip of the catheter overlies the upper atrium. Stable cardiomegaly and pulmonary vascular congestion. No overt edema. Nonspecific left lower lobe opacities. Atherosclerotic calcifications present in the transverse aorta. Vascular stents overlie the right axillary, subclavian and proximal brachiocephalic veins. IMPRESSION: No evidence of  pneumothorax status post right-sided thoracentesis. Electronically Signed   By: Malachy Moan M.D.   On: 11/28/2017  14:42   Ct Head Wo Contrast  Result Date: 11/29/2017 CLINICAL DATA:  Altered mental status. History of end-stage renal disease. History of stroke in 04/2017 with residual left-sided weakness. EXAM: CT HEAD WITHOUT CONTRAST TECHNIQUE: Contiguous axial images were obtained from the base of the skull through the vertex without intravenous contrast. COMPARISON:  05/06/2015 head CT [facility]. 05/13/2017 head CT report from [facility] FINDINGS: Brain: A large right MCA territory infarct involves the frontal, parietal, and temporal lobes as well as insula and right lentiform nucleus, new from 2017 though reported on 04/2017 outside head CT. There is associated encephalomalacia with ex vacuo dilatation of the right lateral ventricle. Scattered gyral density within the infarct may reflect petechial blood products or mineralization. No definite acute infarct is identified. There is no significant midline shift or extra-axial fluid collection. Generalized cerebral atrophy is new from 2017 and advanced for age. Vascular: Calcified atherosclerosis at the skull base. No hyperdense vessel. Skull: No fracture or focal osseous lesion. Sinuses/Orbits: Visualized paranasal sinuses and mastoid air cells are clear. Visualized orbits are unremarkable. Other: None. IMPRESSION: 1. Large chronic right MCA infarct. 2. No definite acute intracranial abnormality. Electronically Signed   By: Sebastian Ache M.D.   On: 11/29/2017 10:52   US Venous Img Upper Uni Left  Result Date: 11/29/2017 CLINICAL DATA:  47 year old male with left upper extremity edema and a known occluded left arm arteriovenous fistula. EXAM: LEFT UPPER EXTREMITY VENOUS DOPPLER ULTRASOUND TECHNIQUE: Gray-scale sonography with graded compression, as well as color Doppler and duplex ultrasound were performed to evaluate the upper extremity deep venous system from the level of the subclavian vein and including the jugular, axillary, basilic, radial, ulnar and  upper cephalic vein. Spectral Doppler was utilized to evaluate flow at rest and with distal augmentation maneuvers. COMPARISON:  None. FINDINGS: Contralateral Subclavian Vein: Respiratory phasicity is normal and symmetric with the symptomatic side. No evidence of thrombus. Normal compressibility. Internal Jugular Vein: The internal jugular vein is small in caliber and is not compressible. The lumen is filled with echogenic material. The findings are most consistent with chronic thrombus. There is evidence of partial flow on color Doppler imaging. Subclavian Vein: No evidence of thrombus. Normal compressibility, respiratory phasicity and response to augmentation. Axillary Vein: No evidence of thrombus. Normal compressibility, respiratory phasicity and response to augmentation. Cephalic Vein: No evidence of thrombus. Normal compressibility, respiratory phasicity and response to augmentation. Basilic Vein: No evidence of thrombus. Normal compressibility, respiratory phasicity and response to augmentation. Brachial Veins: No evidence of thrombus. Normal compressibility, respiratory phasicity and response to augmentation. Radial Veins: No evidence of thrombus. Normal compressibility, respiratory phasicity and response to augmentation. Ulnar Veins: No evidence of thrombus. Normal compressibility, respiratory phasicity and response to augmentation. Venous Reflux:  None visualized. Other Findings:  Occluded arteriovenous fistula in the forearm. IMPRESSION: 1. Thrombosed left forearm arteriovenous fistula. 2. No extension of thrombus into the native deep venous system. 3. 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 Electronically Signed   By: Malachy Moan M.D.   On: 11/29/2017 15:31   Dg Chest Port 1 View  Result Date: Dec 13, 2017 CLINICAL DATA:  Acute respiratory failure EXAM: PORTABLE CHEST 1 VIEW COMPARISON:  11/29/2017  and prior exams FINDINGS: Cardiomediastinal silhouette is unchanged. LEFT IJ central venous catheter with tip overlying the SUPERIOR  cavoatrial junction and RIGHT subclavian stents again noted. Diffuse RIGHT lung airspace opacities and RIGHT pleural effusion appears slightly increased LEFT LOWER lung atelectasis/consolidation again noted. There is no evidence of pneumothorax. IMPRESSION: Apparent slight increase in diffuse RIGHT lung airspace disease and RIGHT pleural effusion. No other significant change. Continued LEFT LOWER lung atelectasis/consolidation. Electronically Signed   By: Harmon Pier M.D.   On: 12/03/2017 09:28   Dg Chest Port 1 View  Result Date: 11/29/2017 CLINICAL DATA:  Acute respiratory failure.  Dialysis patient. EXAM: PORTABLE CHEST 1 VIEW COMPARISON:  11/28/2017 FINDINGS: Progression of bilateral airspace disease right greater than left. Probable edema versus pneumonia. Progression of small pleural effusions. Progression of left lower lobe consolidation Left jugular central venous catheter tip in the right atrium. Right subclavian vascular stents unchanged. IMPRESSION: Progression of bilateral airspace disease right greater than left and small effusions most consistent with fluid overload and edema. Electronically Signed   By: Marlan Palau M.D.   On: 11/29/2017 11:41   US Thoracentesis Asp Pleural Space W/img Guide  Result Date: 11/28/2017 CLINICAL DATA:  Large right pleural effusion. EXAM: ULTRASOUND GUIDED RIGHT THORACENTESIS COMPARISON:  CT of the chest on 12/01/2017 PROCEDURE: An ultrasound guided thoracentesis was thoroughly discussed with the patient's mother and questions answered. The benefits, risks, alternatives and complications were also discussed. The patient's mother understands and wishes to proceed with the procedure. Written consent was obtained. Ultrasound was performed to localize and mark an adequate pocket of fluid in the right chest. The area was then prepped  and draped in the normal sterile fashion. 1% Lidocaine was used for local anesthesia. Under ultrasound guidance a 6 French Safe-T-Centesis catheter was introduced. Thoracentesis was performed. The catheter was removed and a dressing applied. COMPLICATIONS: None FINDINGS: A total of approximately 2 L of clear, yellow fluid was removed. A fluid sample was sent for laboratory analysis. IMPRESSION: Successful ultrasound guided right thoracentesis yielding 2 L of pleural fluid. Electronically Signed   By: Irish Lack M.D.   On: 11/28/2017 15:02    Medications:  I have reviewed the patient's current medications. Prior to Admission:  Medications Prior to Admission  Medication Sig Dispense Refill Last Dose  . acetaminophen (TYLENOL) 325 MG tablet Take 650 mg by mouth every 6 (six) hours as needed for mild pain or moderate pain.   prn at prn  . alprazolam (XANAX) 2 MG tablet Take 1 tablet by mouth at bedtime as needed for sleep.   0 prn at prn  . aspirin 81 MG chewable tablet Chew 1 tablet (81 mg total) by mouth daily. 180 tablet 0 11/26/2017 at Unknown time  . cinacalcet (SENSIPAR) 60 MG tablet Take 1 tablet (60 mg total) by mouth at bedtime. 30 tablet 0 11/26/2017 at Unknown time  . escitalopram (LEXAPRO) 20 MG tablet Take 20 mg by mouth at bedtime.  5 11/26/2017 at Unknown time  . gabapentin (NEURONTIN) 300 MG capsule Take 300 mg by mouth at bedtime as needed.   0 prn at prn  . lidocaine-prilocaine (EMLA) cream Apply 1 application topically as needed. (Patient taking differently: Apply 1 application topically as needed (pain). ) 30 g 0 prn at prn  . midodrine (PROAMATINE) 10 MG tablet Take 1.5 tablets (15 mg total) by mouth every 8 (eight) hours. 90 tablet 2 12/15/2017 at 1000  . Multiple Vitamin (MULTIVITAMIN WITH MINERALS) TABS tablet Take 1 tablet by mouth daily.   11/26/2017 at Unknown time  . multivitamin (RENA-VIT) TABS tablet Take 1  tablet by mouth at bedtime. 180 tablet 0 11/26/2017 at Unknown  time  . multivitamin-lutein (OCUVITE-LUTEIN) CAPS capsule Take 1 capsule by mouth daily.  0 11/26/2017 at Unknown time  . pantoprazole (PROTONIX) 20 MG tablet Take 1 tablet (20 mg total) by mouth daily. 30 tablet 1 11/26/2017 at Unknown time  . polyethylene glycol (MIRALAX / GLYCOLAX) packet Take 17 g by mouth daily as needed for mild constipation. 14 each 0 prn at prn  . sevelamer carbonate (RENVELA) 2.4 g PACK Take 2.4 g by mouth 3 (three) times daily with meals.   11/26/2017 at Unknown time  . traMADol (ULTRAM) 50 MG tablet Take 50 mg by mouth every 12 (twelve) hours as needed. for pain  0 PRN at PRN  . vitamin A 96045 UNIT capsule Take 1 capsule (10,000 Units total) by mouth daily.   11/26/2017 at Unknown time  . vitamin C (VITAMIN C) 250 MG tablet Take 1 tablet (250 mg total) by mouth 2 (two) times daily.   11/18/2017 at 0400  . Vitamin D, Ergocalciferol, (DRISDOL) 50000 units CAPS capsule Take 1 capsule (50,000 Units total) by mouth every 7 (seven) days. (Patient taking differently: Take 50,000 Units by mouth every 7 (seven) days. Wednesday or Thursday) 5 capsule  Past Week at Unknown time  . warfarin (COUMADIN) 3 MG tablet Take 1 tablet (3 mg total) by mouth daily. 60 tablet 0 11/26/2017 at 2000  . senna-docusate (SENOKOT-S) 8.6-50 MG tablet Take 2 tablets by mouth 2 (two) times daily. (Patient not taking: Reported on 12/08/2017) 120 tablet 0 Not Taking at Unknown time   Scheduled: . aspirin  81 mg Oral Daily  . Chlorhexidine Gluconate Cloth  6 each Topical Q0600  . cinacalcet  60 mg Oral QHS  . escitalopram  20 mg Oral QHS  . gabapentin  300 mg Oral BID  . hydrocortisone sod succinate (SOLU-CORTEF) inj  50 mg Intravenous Q12H  . midodrine  15 mg Oral Q8H  . multivitamin  1 tablet Oral QHS  . pantoprazole (PROTONIX) IV  40 mg Intravenous Daily  . senna-docusate  2 tablet Oral BID  . sevelamer carbonate  2.4 g Oral TID WC  . Warfarin - Pharmacist Dosing Inpatient   Does not apply [phone]    Patient seen and examined.  Clinical course and management discussed.  Necessary edits performed.  I agree with the above.  Assessment and plan of care developed and discussed below.    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 Found to have right pleural effusion, currently status post thoracentesis. Neuro consulted due to due to altered mental status. Mental status initially improved but now lethargic and on Bipap due to metabolic acidosis. INR elevated, heparin gtt and coumadin stopped. Repeat Echo this morning showed appearance of a mass/vegetation or thrombus in the left pleural space possibly attached to the dialysis catheter and possible fistulas in the aortic valve as well as the base of anterior leaflet of the mitral valve. Patient deemed to be not a candidate for any surgical intervention per cardiology.  EEG abnormal secondary to mild posterior background slowing otherwise no epileptiform discharges noted. Unable to obtain MRI brain due to patient being critically ill and unstable on Bipap.  Plan 1. Seizure precautions 2. Ativan prn seizure activity  3. MRI of the brain without contrast deferred at this time.  Patient with multiple medical issues that are mote pressing.    This patient was staffed with Dr. Verlon Au, Thad Ranger who personally evaluated patient, reviewed documentation and agreed with assessment and plan of care as above.  Webb Silversmith, DNP, FNP-BC Board certified Nurse Practitioner Neurology Department   LOS: 3 days   12/02/2017  1:14 PM  Case discussed with Dr. Dellia Nims, MD Neurology [phone]  12/02/2017  2:08 PM

## 2017-11-30 NOTE — Progress Notes (Signed)
Pts mother Sherren KernsMargaret Fuller arrived at pts bedside for scheduled meeting to discuss goals of treatment.  I discussed echocardiogram results and explained pt deemed not a candidate for surgical intervention and decline in pts condition overnight.  Pt remains hypotensive despite vasopressors and stress dose steroids.  He is currently on Bipap and lethargic.  Pts mother has decided to transition pt to comfort measures only once additional family members arrive at bedside. Will continue to monitor and assess pt.   Sonda Rumbleana Allyce Bochicchio, AGNP  Pulmonary/Critical Care Pager [phone] (please enter 7 digits) PCCM Consult Pager [phone] (please enter 7 digits)

## 2017-11-30 NOTE — Progress Notes (Signed)
ANTICOAGULATION CONSULT NOTE   Pharmacy Consult for Warfarin Indication: atrial fibrillation  Allergies  Allergen Reactions  . Depakote [Divalproex Sodium] Other (See Comments)    Hallucinations   . Hydromorphone Other (See Comments)    Patient Measurements: Height: 5\' 8"  (172.7 cm) Weight: 162 lb 7.7 oz (73.7 kg) IBW/kg (Calculated) : 68.4 Heparin Dosing Weight: 77 kg  Vital Signs: Temp: 98.4 F (36.9 C) (11/14 0800) Temp Source: Axillary (11/14 0800) BP: 108/34 (11/14 1100) Pulse Rate: 90 (11/14 1100)  Labs: Recent Labs    12/14/2017 2154 11/28/17 0441 11/29/17 0339  11/29/17 1140 11/29/17 1739 11/29/17 2340 07-28-17 0343 07-28-17 0635  HGB  --  11.8*  --   --  11.8*  --   --  11.6*  --   HCT  --  40.8  --   --  41.0  --   --  40.5  --   PLT  --  414*  --   --  403*  --   --  410*  --   APTT 39*  --   --   --   --   --   --   --  130*  LABPROT 24.0*  --  23.2*  --   --   --   --  53.6* 41.0*  INR 2.18  --  2.09  --   --   --   --  6.16* 4.36*  CREATININE  --  4.34*  --   --  6.42*  --  6.69* 6.72*  --   TROPONINI  --   --   --    < > 0.04* 0.08* 0.05*  --  0.06*   < > = values in this interval not displayed.    Estimated Creatinine Clearance: 13.3 mL/min (A) (by C-G formula based on SCr of 6.72 mg/dL (H)).   Medical History: Past Medical History:  Diagnosis Date  . Acute meniscal tear of left knee   . Acute meniscal tear of right knee   . Anxiety   . Chronic back pain    due to weight   . Dialysis patient (HCC)   . Endocarditis   . Fracture of transverse process of thoracic vertebra (HCC)    [facility]T9[facility]- cleared by neurosurgery  . GERD (gastroesophageal reflux disease)   . Hemodialysis patient [facility](HCC)    since 2011, does hemodialysis at home, wife does she is a [employer] , Daily except wed and sun  . High blood pressure    hx of   . Kidney failure    on dialysis since 2011- [facility]- nephrologist   . Seizures (HCC)   . Sleep apnea    cpap-   . Stroke [facility](HCC)     Medications:  Warfarin 3mg  daily  at home  Assessment: INR 2.16 on admission. Patient's warfarin was held and started on Heparin drip for thoracentesis. Patient is now s/p thoracentesis and Dr. Allena KatzPatel has ordered that Heparin be stopped and warfarin resumed. Hemodialysis pt. Patient did not receive warfarin dose on 11/13.   11/11 INR 2.18   None 11/12 none    None 11/13 INR 2.09 11/14 INR 4.36   Goal of Therapy:  INR 2- 3   Plan:  Will hold Warfarin 3mg  on 11/14 as INR is supratherapeutic. Will check INR with AM labs.  Emmaline LifeYang Marquarius Lofton  Pharmacy student 11/10/17
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
## 2017-11-30 NOTE — Consult Note (Addendum)
Pharmacy Antibiotic Note  Perry [name] is a 47 y.o. male admitted on 23-Feb-2017 with Acute hypoxic respiratory failure. Pharmacy has been consulted for Vancomycin dosing for possible MRSA Pneumonia. Patient has PMH of ERSD (HD MWF).  Patiently recently treated for MRSA bacteremia and MRSA endocarditis.   Plan: Will continue vancomycin 750mg  IV with HD MWF. Vanc level ordered prior to 3rd HD. Target pre- HD level 15-25 mcg/mL. Will adjust dose according the pre-HD trough.   Will continue cefepime 1 g IV q24h.   Height: 5\' 8"  (172.7 cm) Weight: 162 lb 7.7 oz (73.7 kg) IBW/kg (Calculated) : 68.4  Temp (24hrs), Avg:99.5 F (37.5 C), Min:98.4 F (36.9 C), Max:101.1 F (38.4 C)  Recent Labs  Lab 08-14-17 1915 11/28/17 0441 11/29/17 1140 11/29/17 2340 11/26/2017 0343  WBC 6.9 6.3 16.1*  --  12.8*  CREATININE 3.64* 4.34* 6.42* 6.69* 6.72*  LATICACIDVEN  --   --  1.8  --   --     Estimated Creatinine Clearance: 13.3 mL/min (A) (by C-G formula based on SCr of 6.72 mg/dL (H)).    Allergies  Allergen Reactions  . Depakote [Divalproex Sodium] Other (See Comments)    Hallucinations   . Hydromorphone Other (See Comments)    Antimicrobials this admission: 11/13 vancomycin  >>  11/13 cefepime >>   Microbiology results: 11/13 respiratory culture: GPR, GNR, yeast,GPC in pairs  11/13 BCx: NG TD  11/13 MRSA PCR: negative 11/12 pleural fluid Cx: NG x 2 days 11/12 MRSA PCR: negative 11/11 BCx: NG 3 days  Thank you for allowing pharmacy to be a part of this patient's care.  Perry [name], 12/13/2017 2:00 PM

## 2017-11-30 NOTE — Progress Notes (Signed)
[facility]  ROUNDING NOTE   Subjective:  Patient was transitioned to the critical care unit yesterday for hypotension. Blood pressure currently 116/39. 2D echocardiogram performed this a.m. which reveals thrombus at the end of his PermCath. This was discussed with vascular surgery.  Objective:  Vital signs in last 24 hours:  Temp:  [98.7 F (37.1 C)-101.1 F (38.4 C)] 101.1 F (38.4 C) (11/14 0400) Pulse Rate:  [83-123] 87 (11/14 0812) Resp:  [17-51] 18 (11/14 0812) BP: (81-128)/(20-58) 116/39 (11/14 0800) SpO2:  [95 %-100 %] 100 % (11/14 0812) FiO2 (%):  [30 %] 30 % (11/14 0812) Weight:  [73.7 kg] 73.7 kg (11/13 1042)  Weight change:  Filed Weights   11/28/17 0021 11/29/17 1042  Weight: 77.2 kg 73.7 kg    Intake/Output: I/O last 3 completed shifts: In: 1650.3 [I.V.:1384.2; IV Piggyback:266.1] Out: -    Intake/Output this shift:  No intake/output data recorded.  Physical Exam: General: Critically ill appearing  Head: Normocephalic, atraumatic. Moist oral mucosal membranes  Eyes: Anicteric  Neck: Supple, trachea midline  Lungs:  Scattered rhonchi, on bipap  Heart: S1S2 no rubs  Abdomen:  Soft, nontender, bowel sounds present  Extremities: 1+ peripheral edema.  Neurologic: Lethargic but arousable  Skin: No lesions  Access: L IJ permcath    Basic Metabolic Panel: Recent Labs  Lab 12/14/2017 1915 11/28/17 0441 11/29/17 0955 11/29/17 1140 11/29/17 2340 11/22/2017 0343  NA 139 142  --  145 143 143  K 3.9 4.1  --  4.1 5.5* 3.8  CL 102 104  --  103 106 107  CO2 27 26  --  26 22 25   GLUCOSE 105* 93  --  105* 149* 104*  BUN 10 13  --  26* 31* 33*  CREATININE 3.64* 4.34*  --  6.42* 6.69* 6.72*  CALCIUM 8.0* 8.2*  --  8.4* 8.3* 8.4*  MG  --   --   --   --  2.0 2.2  PHOS  --   --  5.4*  --  5.8* 4.5    Liver Function Tests: Recent Labs  Lab 11/20/2017 1915 11/29/17 1140 11/23/2017 0343  AST 28 12* 17  ALT 11 9 9   ALKPHOS 127* 117 109  BILITOT  1.4* 1.2 1.1  PROT 5.9* 6.4* 6.3*  ALBUMIN 2.4* 2.9* 2.9*   No results for input(s): LIPASE, AMYLASE in the last 168 hours. No results for input(s): AMMONIA in the last 168 hours.  CBC: Recent Labs  Lab 11/17/2017 1915 11/28/17 0441 11/29/17 1140 11/18/2017 0343  WBC 6.9 6.3 16.1* 12.8*  NEUTROABS  --   --  14.6*  --   HGB 12.1* 11.8* 11.8* 11.6*  HCT 41.6 40.8 41.0 40.5  MCV 93.1 92.3 93.8 95.3  PLT 369 414* 403* 410*    Cardiac Enzymes: Recent Labs  Lab 11/29/17 1140 11/29/17 1739 11/29/17 2340 11/25/2017 0635  TROPONINI 0.04* 0.08* 0.05* 0.06*    BNP: Invalid input(s): POCBNP  CBG: Recent Labs  Lab 11/29/17 1036 11/29/17 1940 11/20/2017 0017 12/07/2017 0334 11/21/2017 0801  GLUCAP 94 127* 131* 120* 110*    Microbiology: Results for orders placed or performed during the hospital encounter of 12/13/2017  Culture, blood (single) w Reflex to ID Panel     Status: None (Preliminary result)   Collection Time: 12/12/2017  9:54 PM  Result Value Ref Range Status   Specimen Description BLOOD RIGHT ANTECUBITAL  Final   Special Requests   Final    BOTTLES DRAWN  AEROBIC AND ANAEROBIC Blood Culture results may not be optimal due to an excessive volume of blood received in culture bottles   Culture   Final    NO GROWTH 3 DAYS Performed at [facility], [address] [place], [place] [address]    Report Status PENDING  Incomplete  MRSA PCR Screening     Status: None   Collection Time: 11/28/17 12:31 AM  Result Value Ref Range Status   MRSA by PCR NEGATIVE NEGATIVE Final    Comment:        The GeneXpert MRSA Assay (FDA approved for NASAL specimens only), is one component of a comprehensive MRSA colonization surveillance program. It is not intended to diagnose MRSA infection nor to guide or monitor treatment for MRSA infections. Performed at [facility], [address] [place], [place] [address]   Body fluid culture     Status: None (Preliminary  result)   Collection Time: 11/28/17  1:50 PM  Result Value Ref Range Status   Specimen Description   Final    PLEURAL Performed at [facility], [address], [place], [place] [address]    Special Requests   Final    NONE Performed at [facility], [address] [place], [place] [address]    Gram Stain NO WBC SEEN NO ORGANISMS SEEN   Final   Culture   Final    NO GROWTH 2 DAYS Performed at [facility], [address] [place], [place] [address]    Report Status PENDING  Incomplete  MRSA PCR Screening     Status: None   Collection Time: 11/29/17 10:41 AM  Result Value Ref Range Status   MRSA by PCR NEGATIVE NEGATIVE Final    Comment:        The GeneXpert MRSA Assay (FDA approved for NASAL specimens only), is one component of a comprehensive MRSA colonization surveillance program. It is not intended to diagnose MRSA infection nor to guide or monitor treatment for MRSA infections. Performed at [facility], [address] [place], [place] [address]   CULTURE, BLOOD (ROUTINE X 2) w Reflex to ID Panel     Status: None (Preliminary result)   Collection Time: 11/29/17  5:39 PM  Result Value Ref Range Status   Specimen Description BLOOD RIGHT THUMB  Final   Special Requests   Final    BOTTLES DRAWN AEROBIC ONLY Blood Culture results may not be optimal due to an inadequate volume of blood received in culture bottles   Culture   Final    NO GROWTH < 12 HOURS Performed at [facility], [address], [place], [place] [address]    Report Status PENDING  Incomplete  CULTURE, BLOOD (ROUTINE X 2) w Reflex to ID Panel     Status: None (Preliminary result)   Collection Time: 11/29/17  6:00 PM  Result Value Ref Range Status   Specimen Description BLOOD PORTA CATH  Final   Special Requests   Final    BOTTLES DRAWN AEROBIC AND ANAEROBIC Blood Culture results may not be optimal due to an excessive volume of blood received in culture  bottles   Culture  Setup Time   Final    Organism ID to follow ANAEROBIC BOTTLE ONLY Performed at [facility] [address] [place], [place] [address]    Culture PENDING  Incomplete   Report Status PENDING  Incomplete  Blood Culture ID Panel (Reflexed)     Status:  None   Collection Time: 11/29/17  6:00 PM  Result Value Ref Range Status   Enterococcus species NOT DETECTED NOT DETECTED Final   Listeria monocytogenes NOT DETECTED NOT DETECTED Final   Staphylococcus species NOT DETECTED NOT DETECTED Final   Staphylococcus aureus (BCID) NOT DETECTED NOT DETECTED Final   Streptococcus species NOT DETECTED NOT DETECTED Final   Streptococcus agalactiae NOT DETECTED NOT DETECTED Final   Streptococcus pneumoniae NOT DETECTED NOT DETECTED Final   Streptococcus pyogenes NOT DETECTED NOT DETECTED Final   Acinetobacter baumannii NOT DETECTED NOT DETECTED Final   Enterobacteriaceae species NOT DETECTED NOT DETECTED Final   Enterobacter cloacae complex NOT DETECTED NOT DETECTED Final   Escherichia coli NOT DETECTED NOT DETECTED Final   Klebsiella oxytoca NOT DETECTED NOT DETECTED Final   Klebsiella pneumoniae NOT DETECTED NOT DETECTED Final   Proteus species NOT DETECTED NOT DETECTED Final   Serratia marcescens NOT DETECTED NOT DETECTED Final   Haemophilus influenzae NOT DETECTED NOT DETECTED Final   Neisseria meningitidis NOT DETECTED NOT DETECTED Final   Pseudomonas aeruginosa NOT DETECTED NOT DETECTED Final   Candida albicans NOT DETECTED NOT DETECTED Final   Candida glabrata NOT DETECTED NOT DETECTED Final   Candida krusei NOT DETECTED NOT DETECTED Final   Candida parapsilosis NOT DETECTED NOT DETECTED Final   Candida tropicalis NOT DETECTED NOT DETECTED Final    Comment: Performed at [facility], [address] [place], [place] [address]    Coagulation Studies: Recent Labs    11/24/2017 2152-06-22 11/29/17 0339 11/26/2017 0343 11/18/2017 0635  LABPROT 24.0* 23.2* 53.6*  41.0*  INR 2.18 2.09 6.16* 4.36*    Urinalysis: No results for input(s): COLORURINE, LABSPEC, PHURINE, GLUCOSEU, HGBUR, BILIRUBINUR, KETONESUR, PROTEINUR, UROBILINOGEN, NITRITE, LEUKOCYTESUR in the last 72 hours.  Invalid input(s): APPERANCEUR    Imaging: Dg Chest 1 View  Result Date: 11/28/2017 CLINICAL DATA:  47 year old male status post right-sided thoracentesis EXAM: CHEST  1 VIEW COMPARISON:  Prior chest x-ray 12/12/2017 FINDINGS: No evidence of pneumothorax. Near-total interval resolution of the right-sided pleural effusion. Left IJ approach tunneled hemodialysis catheter. The tip of the catheter overlies the upper atrium. Stable cardiomegaly and pulmonary vascular congestion. No overt edema. Nonspecific left lower lobe opacities. Atherosclerotic calcifications present in the transverse aorta. Vascular stents overlie the right axillary, subclavian and proximal brachiocephalic veins. IMPRESSION: No evidence of pneumothorax status post right-sided thoracentesis. Electronically Signed   By: Malachy Moan M.D.   On: 11/28/2017 14:42   Ct Head Wo Contrast  Result Date: 11/29/2017 CLINICAL DATA:  Altered mental status. History of end-stage renal disease. History of stroke in 04/2017 with residual left-sided weakness. EXAM: CT HEAD WITHOUT CONTRAST TECHNIQUE: Contiguous axial images were obtained from the base of the skull through the vertex without intravenous contrast. COMPARISON:  05/06/2015 head CT [facility]. 05/13/2017 head CT report from [facility]. FINDINGS: Brain: A large right MCA territory infarct involves the frontal, parietal, and temporal lobes as well as insula and right lentiform nucleus, new from 2015/06/23 though reported on 04/2017 outside head CT. There is associated encephalomalacia with ex vacuo dilatation of the right lateral ventricle. Scattered gyral density within the infarct may reflect petechial blood products or mineralization. No definite acute infarct is  identified. There is no significant midline shift or extra-axial fluid collection. Generalized cerebral atrophy is new from 23-Jun-2015 and advanced for age. Vascular: Calcified atherosclerosis at the skull base. No hyperdense vessel. Skull: No fracture or focal osseous lesion. Sinuses/Orbits: Visualized paranasal sinuses and  mastoid air cells are clear. Visualized orbits are unremarkable. Other: None. IMPRESSION: 1. Large chronic right MCA infarct. 2. No definite acute intracranial abnormality. Electronically Signed   By: Sebastian Ache M.D.   On: 11/29/2017 10:52   US Venous Img Upper Uni Left  Result Date: 11/29/2017 CLINICAL DATA:  47 year old male with left upper extremity edema and a known occluded left arm arteriovenous fistula. EXAM: LEFT UPPER EXTREMITY VENOUS DOPPLER ULTRASOUND TECHNIQUE: Gray-scale sonography with graded compression, as well as color Doppler and duplex ultrasound were performed to evaluate the upper extremity deep venous system from the level of the subclavian vein and including the jugular, axillary, basilic, radial, ulnar and upper cephalic vein. Spectral Doppler was utilized to evaluate flow at rest and with distal augmentation maneuvers. COMPARISON:  None. FINDINGS: Contralateral Subclavian Vein: Respiratory phasicity is normal and symmetric with the symptomatic side. No evidence of thrombus. Normal compressibility. Internal Jugular Vein: The internal jugular vein is small in caliber and is not compressible. The lumen is filled with echogenic material. The findings are most consistent with chronic thrombus. There is evidence of partial flow on color Doppler imaging. Subclavian Vein: No evidence of thrombus. Normal compressibility, respiratory phasicity and response to augmentation. Axillary Vein: No evidence of thrombus. Normal compressibility, respiratory phasicity and response to augmentation. Cephalic Vein: No evidence of thrombus. Normal compressibility, respiratory phasicity and  response to augmentation. Basilic Vein: No evidence of thrombus. Normal compressibility, respiratory phasicity and response to augmentation. Brachial Veins: No evidence of thrombus. Normal compressibility, respiratory phasicity and response to augmentation. Radial Veins: No evidence of thrombus. Normal compressibility, respiratory phasicity and response to augmentation. Ulnar Veins: No evidence of thrombus. Normal compressibility, respiratory phasicity and response to augmentation. Venous Reflux:  None visualized. Other Findings:  Occluded arteriovenous fistula in the forearm. IMPRESSION: 1. Thrombosed left forearm arteriovenous fistula. 2. No extension of thrombus into the native deep venous system. 3. 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 Electronically Signed   By: Malachy Moan M.D.   On: 11/29/2017 15:31   Dg Chest Port 1 View  Result Date: 11/29/2017 CLINICAL DATA:  Acute respiratory failure.  Dialysis patient. EXAM: PORTABLE CHEST 1 VIEW COMPARISON:  11/28/2017 FINDINGS: Progression of bilateral airspace disease right greater than left. Probable edema versus pneumonia. Progression of small pleural effusions. Progression of left lower lobe consolidation Left jugular central venous catheter tip in the right atrium. Right subclavian vascular stents unchanged. IMPRESSION: Progression of bilateral airspace disease right greater than left and small effusions most consistent with fluid overload and edema. Electronically Signed   By: Marlan Palau M.D.   On: 11/29/2017 11:41   US Thoracentesis Asp Pleural Space W/img Guide  Result Date: 11/28/2017 CLINICAL DATA:  Large right pleural effusion. EXAM: ULTRASOUND GUIDED RIGHT THORACENTESIS COMPARISON:  CT of the chest on 12/01/2017 PROCEDURE: An ultrasound guided thoracentesis was thoroughly discussed with the patient's mother and questions answered. The  benefits, risks, alternatives and complications were also discussed. The patient's mother understands and wishes to proceed with the procedure. Written consent was obtained. Ultrasound was performed to localize and mark an adequate pocket of fluid in the right chest. The area was then prepped and draped in the normal sterile fashion. 1% Lidocaine was used for local anesthesia. Under ultrasound guidance a 6 French Safe-T-Centesis catheter was introduced. Thoracentesis was performed. The catheter was removed and a dressing applied. COMPLICATIONS: None FINDINGS: A total of approximately 2 L  of clear, yellow fluid was removed. A fluid sample was sent for laboratory analysis. IMPRESSION: Successful ultrasound guided right thoracentesis yielding 2 L of pleural fluid. Electronically Signed   By: Irish LackGlenn  Yamagata M.D.   On: 11/28/2017 15:02     Medications:   . sodium chloride    . sodium chloride    . ceFEPime (MAXIPIME) IV Stopped (11/29/17 2205)  . norepinephrine (LEVOPHED) Adult infusion 20 mcg/min (11/18/2017 0609)  . [START ON 12/01/2017] vancomycin    . vasopressin (PITRESSIN) infusion - *FOR SHOCK* 0.03 Units/min (12/07/2017 0609)   . aspirin  81 mg Oral Daily  . Chlorhexidine Gluconate Cloth  6 each Topical Q0600  . cinacalcet  60 mg Oral QHS  . escitalopram  20 mg Oral QHS  . gabapentin  300 mg Oral BID  . hydrocortisone sod succinate (SOLU-CORTEF) inj  50 mg Intravenous Q6H  . midodrine  15 mg Oral Q8H  . multivitamin  1 tablet Oral QHS  . pantoprazole (PROTONIX) IV  40 mg Intravenous Daily  . senna-docusate  2 tablet Oral BID  . sevelamer carbonate  2.4 g Oral TID WC  . vitamin A  10,000 Units Oral Daily  . ascorbic acid  250 mg Oral BID  . Vitamin D (Ergocalciferol)  50,000 Units Oral Q7 days  . Warfarin - Pharmacist Dosing Inpatient   Does not apply q1800   sodium chloride, sodium chloride, acetaminophen, alteplase, docusate sodium, heparin, lidocaine (PF), lidocaine-prilocaine,  pentafluoroprop-tetrafluoroeth, polyethylene glycol  Assessment/ Plan:  47 y.o. 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  [facility]/MWF  1.  ESRD on HD MWF.  2D echocardiogram performed today which demonstrated thrombus at the end of his PermCath.  This could potentially be infected.  We have consulted with vascular surgery to remove the PermCath.  We will attempt to keep him catheter free for at least 24 hours before replacing a temporary dialysis catheter.  2.  Anemia chronic kidney disease.  Hemoglobin 11.6.  Hold off on Epogen for now.  3.  Secondary hyperparathyroidism.  Continue to periodically monitor bone mineral metabolism parameters.  4.  Pleural effusion.  Status post thoracentesis this admission.  5.  Hypotension.  Continue vasopressin as well as norepinephrine.   LOS: 3 Gerre Ranum 11/14/20199:07 AM

## 2017-11-30 NOTE — Progress Notes (Signed)
Palliative care:  Change in patient status noted. Had GOC meeting with patient's mother 2 days ago. Goals at that time were DNR, but treat the treatable, continue HD.  Attempted to speak with mother today - she did not answer phone. Left voice message.   ICU providers meeting with mother at 4 PM today.   Will await call back and plan to follow up with mother tomorrow.  Perry RenShae Lee Ritha Sampedro, DNP, AGNP-C Palliative Medicine Team Team Phone # [phone]  Pager # [phone]  No charge

## 2017-11-30 NOTE — Progress Notes (Addendum)
SLP Cancellation Note  Patient Details Name: Perry [name] MRN: [id number] DOB: February 22, 1970   Cancelled treatment:       Reason Eval/Treat Not Completed: Medical issues which prohibited therapy;Patient's level of consciousness; Chart reviewed. Patient currently has orders for Renal diet with 1200mL fluid restriction. No PO's consumed since diet order placed. Discussed patient with nursing who reported patient is currently on BiPAP, very lethargic, with low BP and not appropriate for swallow evaluation at this time. Will re-attempt tomorrow or later today as time permits and determine if patient is alert and appropriate for evaluation. Nursing in agreement.   Bonniejean Piano, MA, CCC-SLP 12/04/2017, 10:10 AM

## 2017-11-30 NOTE — Progress Notes (Signed)
Follow up Potassium 3.8.  Continue to monitor.

## 2017-11-30 NOTE — Consult Note (Signed)
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 bilateral airspace disease right greater than left and small effusions most consistent with fluid overload and edema. Electronically Signed   By: Marlan Palau M.D.   On: 11/29/2017 11:41   Dg Chest Port 1 View  Result Date: 12/06/2017 CLINICAL DATA:  Shortness of breath. Cough. On dialysis. EXAM: PORTABLE CHEST 1 VIEW COMPARISON:  09/21/2017 FINDINGS: The patient is rotated to the left. Dialysis catheter terminates near the superior cavoatrial junction. Vascular stents are noted in the right subclavian region. The cardiomediastinal silhouette is unchanged. Aortic atherosclerosis is noted.  There is a moderate-sized right pleural effusion which has enlarged. There is a persistent small left pleural effusion. Pulmonary vascular congestion is again noted with bilateral interstitial and hazy airspace opacities, similar to the prior study. No pneumothorax is identified. No acute osseous abnormality is identified. IMPRESSION: 1. Moderate right pleural effusion, increased from the prior study. Persistent small left pleural effusion. 2. Similar appearance of pulmonary edema.  Electronically Signed   By: Sebastian Ache M.D.   On: 12/12/2017 19:35   US Thoracentesis Asp Pleural Space W/img Guide  Result Date: 11/28/2017 CLINICAL DATA:  Large right pleural effusion. EXAM: ULTRASOUND GUIDED RIGHT THORACENTESIS COMPARISON:  CT of the chest on 12/09/2017 PROCEDURE: An ultrasound guided thoracentesis was thoroughly discussed with the patient's mother and questions answered. The benefits, risks, alternatives and complications were also discussed. The patient's mother understands and wishes to proceed with the procedure. Written consent was obtained. Ultrasound was performed to localize and mark an adequate pocket of fluid in the right chest. The area was then prepped and draped in the normal sterile fashion. 1% Lidocaine was used for local anesthesia. Under ultrasound guidance a 6 French Safe-T-Centesis catheter was introduced. Thoracentesis was performed. The catheter was removed and a dressing applied. COMPLICATIONS: None FINDINGS: A total of approximately 2 L of clear, yellow fluid was removed. A fluid sample was sent for laboratory analysis. IMPRESSION: Successful ultrasound guided right thoracentesis yielding 2 L of pleural fluid. Electronically Signed   By: Irish Lack M.D.   On: 11/28/2017 15:02    Assessment and Plan:   1. Elevated troponin in the setting of ESRD on HD - As above in HPI, recent MRSA bacteremia, endocarditis. Recently seen by [facility] cardiology for valvular disease with transfer to [name] but not felt to be a surgical candidate with plan for conservative medical management.  - Troponin elevation in the setting of infection, hypotension with known valvular disease, ESRD, and volume overload.  - Continue to monitor electrolytes and replace as needed. Continue to monitor renal function.  - Continue medical management and supportive care  - Patient is not a candidate for further ischemic workup given his comorbid conditions with plan for continued medical  management. He is currently DNR with orders placed for palliative care. No further ischemic workup at this time.   2. Hypotension - Recent CV studies as above with limited echo ordered by CCM to r/o pericardial effusion.  - Per CCM  3. Remainder per CCM    For questions or updates, please contact [facility] Please consult [email or web address] for contact info under     Signed, Lennon Alstrom, PA-C  12/01/2017 7:35 AM

## 2017-11-30 NOTE — Progress Notes (Signed)
ANTICOAGULATION CONSULT NOTE - Initial Consult  Pharmacy Consult for heparin drip Indication: chest pain/ACS  Allergies  Allergen Reactions  . Depakote [Divalproex Sodium] Other (See Comments)    Hallucinations   . Hydromorphone Other (See Comments)    Patient Measurements: Height: 5\' 8"  (172.7 cm) Weight: 162 lb 7.7 oz (73.7 kg) IBW/kg (Calculated) : 68.4 Heparin Dosing Weight: 77 kg  Vital Signs: Temp: 98.9 F (37.2 C) (11/13 1945) Temp Source: Oral (11/13 1945) BP: 102/46 (11/13 2000) Pulse Rate: 91 (11/13 1700)  Labs: Recent Labs    12/01/2017 1915 11/21/2017 2154 11/28/17 0441 11/29/17 0339 11/29/17 1140 11/29/17 1739 11/29/17 2340  HGB 12.1*  --  11.8*  --  11.8*  --   --   HCT 41.6  --  40.8  --  41.0  --   --   PLT 369  --  414*  --  403*  --   --   APTT  --  39*  --   --   --   --   --   LABPROT  --  24.0*  --  23.2*  --   --   --   INR  --  2.18  --  2.09  --   --   --   CREATININE 3.64*  --  4.34*  --  6.42*  --  6.69*  TROPONINI  --   --   --   --  0.04* 0.08* 0.05*    Estimated Creatinine Clearance: 13.3 mL/min (A) (by C-G formula based on SCr of 6.69 mg/dL (H)).   Medical History: Past Medical History:  Diagnosis Date  . Acute meniscal tear of left knee   . Acute meniscal tear of right knee   . Anxiety   . Chronic back pain    due to weight   . Dialysis patient (HCC)   . Endocarditis   . Fracture of transverse process of thoracic vertebra (HCC)    [facility] by neurosurgery  . GERD (gastroesophageal reflux disease)   . Hemodialysis patient [facility](HCC)    since 2011, does hemodialysis at home, wife does she is a [employer] , Daily except wed and sun  . High blood pressure    hx of   . Kidney failure    on dialysis since 2011- [facility]- nephrolog[facility]   . Seizures (HCC)   . Sleep apnea    cpap-   . Stroke [facility](HCC)     Medications:  On warfarin. Provider wants to start heparin now.  Assessment: Trop 0.05   Goal of  Therapy:  Heparin level 0.3-0.7 units/ml Monitor platelets by anticoagulation protocol: Yes   Plan:  4000 unit bolus and initial rate of 900 units/hr. First heparin level 8 hours after start of infusion.  Khaidyn Staebell S 11/29/2017,3:44 AM

## 2017-11-30 NOTE — Progress Notes (Signed)
*  PRELIMINARY RESULTS* Echocardiogram 2D Echocardiogram has been performed.  Perry GulaJoan M Morrigan [name] 11/28/2017, 9:00 AM

## 2017-11-30 NOTE — Progress Notes (Addendum)
[facility] -  at [facility]   PATIENT NAME: Sherri Searyrone Stapel    MR#:  [id number]  DATE OF BIRTH:  13-Jul-1970  SUBJECTIVE:  patient came in with increasing shortness of breath and was found to have low oxygen. He status post thoracentesis feels better. He is bedbound taken care of my mother at home. S/p thoracentesis, More lethargic and hypotensive today.  REVIEW OF SYSTEMS:   ROS Pt is more lethargic today, can not give ROS.  Tolerating PT: bed bound  DRUG ALLERGIES:   Allergies  Allergen Reactions  . Depakote [Divalproex Sodium] Other (See Comments)    Hallucinations   . Hydromorphone Other (See Comments)    VITALS:  Blood pressure (!) 124/38, pulse 92, temperature (!) 101.1 F (38.4 C), temperature source Oral, resp. rate 20, height 5\' 8"  (1.727 m), weight 73.7 kg, SpO2 99 %.  PHYSICAL EXAMINATION:   Physical Exam  GENERAL:  47 y.o.-year-old patient lying in the bed with no acute distress. obese EYES: Pupils equal, round, reactive to light and accommodation. No scleral icterus. Extraocular muscles intact.  HEENT: Head atraumatic, normocephalic. Oropharynx and nasopharynx clear.  NECK:  Supple, no jugular venous distention. No thyroid enlargement, no tenderness.  LUNGS: Normal breath sounds bilaterally, no wheezing, rales, rhonchi. No use of accessory muscles of respiration. Perm cath left upper chest CARDIOVASCULAR: S1, S2 normal. No murmurs, rubs, or gallops.  ABDOMEN: Soft, nontender, nondistended. Bowel sounds present. No organomegaly or mass.  EXTREMITIES: No cyanosis, clubbing or edema b/l.    NEUROLOGIC: Cranial nerves II through XII are intact. Chronic hemiplegia PSYCHIATRIC:  patient is Drowsy. SKIN: No obvious rash, lesion, or ulcer.   LABORATORY PANEL:  CBC Recent Labs  Lab 11/20/2017 0343  WBC 12.8*  HGB 11.6*  HCT 40.5  PLT 410*    Chemistries  Recent Labs  Lab 12/14/2017 0343  NA 143  K 3.8  CL 107  CO2 25   GLUCOSE 104*  BUN 33*  CREATININE 6.72*  CALCIUM 8.4*  MG 2.2  AST 17  ALT 9  ALKPHOS 109  BILITOT 1.1   Cardiac Enzymes Recent Labs  Lab 11/20/2017 0635  TROPONINI 0.06*   RADIOLOGY:  Dg Chest 1 View  Result Date: 11/28/2017 CLINICAL DATA:  47 year old male status post right-sided thoracentesis EXAM: CHEST  1 VIEW COMPARISON:  Prior chest x-ray 2017/10/05 FINDINGS: No evidence of pneumothorax. Near-total interval resolution of the right-sided pleural effusion. Left IJ approach tunneled hemodialysis catheter. The tip of the catheter overlies the upper atrium. Stable cardiomegaly and pulmonary vascular congestion. No overt edema. Nonspecific left lower lobe opacities. Atherosclerotic calcifications present in the transverse aorta. Vascular stents overlie the right axillary, subclavian and proximal brachiocephalic veins. IMPRESSION: No evidence of pneumothorax status post right-sided thoracentesis. Electronically Signed   By: Malachy MoanHeath  McCullough M.D.   On: 11/28/2017 14:42   Ct Head Wo Contrast  Result Date: 11/29/2017 CLINICAL DATA:  Altered mental status. History of end-stage renal disease. History of stroke in 04/2017 with residual left-sided weakness. EXAM: CT HEAD WITHOUT CONTRAST TECHNIQUE: Contiguous axial images were obtained from the base of the skull through the vertex without intravenous contrast. COMPARISON:  [facility]06/[facility]. [facility] FINDINGS: Brain: A large right MCA territory infarct involves the frontal, parietal, and temporal lobes as well as insula and right lentiform nucleus, new from 2017 though reported on 04/2017 outside head CT. There is associated encephalomalacia with ex vacuo dilatation of the  right lateral ventricle. Scattered gyral density within the infarct may reflect petechial blood products or mineralization. No definite acute infarct is identified. There is no significant midline shift or extra-axial fluid  collection. Generalized cerebral atrophy is new from 2017 and advanced for age. Vascular: Calcified atherosclerosis at the skull base. No hyperdense vessel. Skull: No fracture or focal osseous lesion. Sinuses/Orbits: Visualized paranasal sinuses and mastoid air cells are clear. Visualized orbits are unremarkable. Other: None. IMPRESSION: 1. Large chronic right MCA infarct. 2. No definite acute intracranial abnormality. Electronically Signed   By: Sebastian Ache M.D.   On: 11/29/2017 10:52   US Venous Img Upper Uni Left  Result Date: 11/29/2017 CLINICAL DATA:  47 year old male with left upper extremity edema and a known occluded left arm arteriovenous fistula. EXAM: LEFT UPPER EXTREMITY VENOUS DOPPLER ULTRASOUND TECHNIQUE: Gray-scale sonography with graded compression, as well as color Doppler and duplex ultrasound were performed to evaluate the upper extremity deep venous system from the level of the subclavian vein and including the jugular, axillary, basilic, radial, ulnar and upper cephalic vein. Spectral Doppler was utilized to evaluate flow at rest and with distal augmentation maneuvers. COMPARISON:  None. FINDINGS: Contralateral Subclavian Vein: Respiratory phasicity is normal and symmetric with the symptomatic side. No evidence of thrombus. Normal compressibility. Internal Jugular Vein: The internal jugular vein is small in caliber and is not compressible. The lumen is filled with echogenic material. The findings are most consistent with chronic thrombus. There is evidence of partial flow on color Doppler imaging. Subclavian Vein: No evidence of thrombus. Normal compressibility, respiratory phasicity and response to augmentation. Axillary Vein: No evidence of thrombus. Normal compressibility, respiratory phasicity and response to augmentation. Cephalic Vein: No evidence of thrombus. Normal compressibility, respiratory phasicity and response to augmentation. Basilic Vein: No evidence of thrombus. Normal  compressibility, respiratory phasicity and response to augmentation. Brachial Veins: No evidence of thrombus. Normal compressibility, respiratory phasicity and response to augmentation. Radial Veins: No evidence of thrombus. Normal compressibility, respiratory phasicity and response to augmentation. Ulnar Veins: No evidence of thrombus. Normal compressibility, respiratory phasicity and response to augmentation. Venous Reflux:  None visualized. Other Findings:  Occluded arteriovenous fistula in the forearm. IMPRESSION: 1. Thrombosed left forearm arteriovenous fistula. 2. No extension of thrombus into the native deep venous system. 3. 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 Electronically Signed   By: Malachy Moan M.D.   On: 11/29/2017 15:31   Dg Chest Port 1 View  Result Date: 11/29/2017 CLINICAL DATA:  Acute respiratory failure.  Dialysis patient. EXAM: PORTABLE CHEST 1 VIEW COMPARISON:  11/28/2017 FINDINGS: Progression of bilateral airspace disease right greater than left. Probable edema versus pneumonia. Progression of small pleural effusions. Progression of left lower lobe consolidation Left jugular central venous catheter tip in the right atrium. Right subclavian vascular stents unchanged. IMPRESSION: Progression of bilateral airspace disease right greater than left and small effusions most consistent with fluid overload and edema. Electronically Signed   By: Marlan Palau M.D.   On: 11/29/2017 11:41   US Thoracentesis Asp Pleural Space W/img Guide  Result Date: 11/28/2017 CLINICAL DATA:  Large right pleural effusion. EXAM: ULTRASOUND GUIDED RIGHT THORACENTESIS COMPARISON:  CT of the chest on 12/03/2017 PROCEDURE: An ultrasound guided thoracentesis was thoroughly discussed with the patient's mother and questions answered. The benefits, risks, alternatives and complications were also discussed. The  patient's mother understands and wishes to proceed with the procedure. Written consent was  obtained. Ultrasound was performed to localize and mark an adequate pocket of fluid in the right chest. The area was then prepped and draped in the normal sterile fashion. 1% Lidocaine was used for local anesthesia. Under ultrasound guidance a 6 French Safe-T-Centesis catheter was introduced. Thoracentesis was performed. The catheter was removed and a dressing applied. COMPLICATIONS: None FINDINGS: A total of approximately 2 L of clear, yellow fluid was removed. A fluid sample was sent for laboratory analysis. IMPRESSION: Successful ultrasound guided right thoracentesis yielding 2 L of pleural fluid. Electronically Signed   By: Irish Lack M.D.   On: 11/28/2017 15:02   ASSESSMENT AND PLAN:  Yani Lal  is a 47 y.o. 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 For last 1 week patient has worsening in cough and hypoxia.  He was prescribed oxygen by nephrologist office but his insurance company denied for that.  Continued to feel short of breath along with the cough so came to emergency room.  Noted to be slightly hypoxic but also have pleural effusion on chest x-ray   * Acute hypoxic respiratory failure due to Bilateral pleural effusion  Right >left - patient is status post thoracentesis with removal of 2 L yellow colored fluid- appears transudate. - sats 100% on 2liter-- wean to room air - DC IV heparin drip and resumed warfarin - CT scan of the chest which showed no loculated collection or lung injuries.  * Hypotension   Pt had some episodes, on midodrine but more today.   Move to stepdown and need levophed drip.  *Recent endocarditis Currently no fever or elevated white blood cell count. Patient had finished  antibiotic course on first November.  ID consult.  *History of stroke and hemiplegia Patient is bedbound and lives with family.  *End-stage renal disease on hemodialysis Nephrologist involved to continue hemodialysis here.  Overall prognosis is very poor because of recent endocarditis, end-stage renal disease, hemiplegia and bedbound status.   Case discussed with Care Management/Social Worker. Management plans discussed with the patient, family and they are in agreement.  CODE STATUS: DNR  DVT Prophylaxis: heparin   Pt was personally seen and examined by me.  TOTAL TIME TAKING CARE OF THIS PATIENT: *35 critical care minutes.  >50% time spent on counselling and coordination of care  POSSIBLE D/C IN *1-2* DAYS, DEPENDING ON CLINICAL CONDITION.  Note: This dictation was prepared with Dragon dictation along with smaller phrase technology. Any transcriptional errors that result from this process are unintentional.  Altamese Dilling M.D on 12/01/2017 at 7:42 AM  Between 7am to 6pm - Pager - [phone]  After 6pm go to [email or web address] - Social research officer, government  [facility]  Office  [phone]  CC: Primary care physician; [facility] ARBER WIEMERS, male   DOB: 1970/07/22, 47 y.o.   MRN: [id number]

## 2017-11-30 NOTE — Progress Notes (Addendum)
Pt had nonsustained run of V-tach (approx. 25 beats). Pt is lethargic (no change from previous) with no complaints of chest pain.  Troponin's are trending, 0.08 > 0.05.  EKG obtained with new ischemic changes in lateral leads (T wave inversion).  BMP obtained, of note is Potassium is 5.5 and Magnesium 2.0.  Given 10 units of insulin and D50. Will repeat serum potassium, pt possibly will need CRRT as he is an HD pt, currently hypotensive requiring Levophed drip.   Will place on Heparin drip and give 325 mg ASA rectally.  Consult Cardiology.

## 2017-12-01 ENCOUNTER — Inpatient Hospital Stay: Payer: Medicare Other

## 2017-12-01 DIAGNOSIS — Z515 Encounter for palliative care: Secondary | ICD-10-CM

## 2017-12-01 LAB — COMPREHENSIVE METABOLIC PANEL
ALK PHOS: 76 U/L (ref 38–126)
ALT: 118 U/L — ABNORMAL HIGH (ref 0–44)
AST: 281 U/L — ABNORMAL HIGH (ref 15–41)
Albumin: 4.5 g/dL (ref 3.5–5.0)
Anion gap: 20 — ABNORMAL HIGH (ref 5–15)
BUN: 45 mg/dL — ABNORMAL HIGH (ref 6–20)
CALCIUM: 8.1 mg/dL — AB (ref 8.9–10.3)
CHLORIDE: 103 mmol/L (ref 98–111)
CO2: 21 mmol/L — ABNORMAL LOW (ref 22–32)
Creatinine, Ser: 7.45 mg/dL — ABNORMAL HIGH (ref 0.61–1.24)
GFR, EST AFRICAN AMERICAN: 9 mL/min — AB (ref >60)
GFR, EST NON AFRICAN AMERICAN: 8 mL/min — AB (ref >60)
Glucose, Bld: 143 mg/dL — ABNORMAL HIGH (ref 70–99)
Potassium: 4.7 mmol/L (ref 3.5–5.1)
Sodium: 144 mmol/L (ref 135–145)
Total Bilirubin: 1.7 mg/dL — ABNORMAL HIGH (ref 0.3–1.2)
Total Protein: 7.1 g/dL (ref 6.5–8.1)

## 2017-12-01 LAB — CBC WITH DIFFERENTIAL/PLATELET
ABS IMMATURE GRANULOCYTES: 0.09 10*3/uL — AB (ref 0.00–0.07)
Basophils Absolute: 0 10*3/uL (ref 0.0–0.1)
Basophils Relative: 0 %
EOS ABS: 0 10*3/uL (ref 0.0–0.5)
Eosinophils Relative: 0 %
HCT: 38.3 % — ABNORMAL LOW (ref 39.0–52.0)
Hemoglobin: 11.1 g/dL — ABNORMAL LOW (ref 13.0–17.0)
Immature Granulocytes: 1 %
LYMPHS ABS: 0.4 10*3/uL — AB (ref 0.7–4.0)
LYMPHS PCT: 3 %
MCH: 26.7 pg (ref 26.0–34.0)
MCHC: 29 g/dL — ABNORMAL LOW (ref 30.0–36.0)
MCV: 92.1 fL (ref 80.0–100.0)
Monocytes Absolute: 0.6 10*3/uL (ref 0.1–1.0)
Monocytes Relative: 6 %
NEUTROS PCT: 90 %
NRBC: 0.3 % — AB (ref 0.0–0.2)
Neutro Abs: 9.4 10*3/uL — ABNORMAL HIGH (ref 1.7–7.7)
PLATELETS: 390 10*3/uL (ref 150–400)
RBC: 4.16 MIL/uL — AB (ref 4.22–5.81)
RDW: 19.2 % — ABNORMAL HIGH (ref 11.5–15.5)
WBC: 10.5 10*3/uL (ref 4.0–10.5)

## 2017-12-01 LAB — BLOOD GAS, ARTERIAL
Acid-Base Excess: 2.8 mmol/L — ABNORMAL HIGH (ref 0.0–2.0)
Bicarbonate: 27.2 mmol/L (ref 20.0–28.0)
Delivery systems: POSITIVE
Expiratory PAP: 5
FIO2: 0.4
Inspiratory PAP: 14
O2 SAT: 98.1 %
PH ART: 7.44 (ref 7.350–7.450)
PO2 ART: 102 mmHg (ref 83.0–108.0)
Patient temperature: 37
pCO2 arterial: 40 mmHg (ref 32.0–48.0)

## 2017-12-01 LAB — BODY FLUID CULTURE
Culture: NO GROWTH
GRAM STAIN: NONE SEEN

## 2017-12-01 LAB — BPAM FFP
BLOOD PRODUCT EXPIRATION DATE: 201911182359
Blood Product Expiration Date: 201911182359
UNIT TYPE AND RH: 8400
Unit Type and Rh: 8400

## 2017-12-01 LAB — PREPARE FRESH FROZEN PLASMA

## 2017-12-01 LAB — LD, BODY FLUID (OTHER): LD, Body Fluid: 73 IU/L

## 2017-12-01 LAB — PROCALCITONIN: Procalcitonin: 9.26 ng/mL

## 2017-12-01 LAB — GLUCOSE, CAPILLARY
GLUCOSE-CAPILLARY: 107 mg/dL — AB (ref 70–99)
GLUCOSE-CAPILLARY: 105 mg/dL — AB (ref 70–99)

## 2017-12-01 LAB — PHOSPHORUS: Phosphorus: 5.8 mg/dL — ABNORMAL HIGH (ref 2.5–4.6)

## 2017-12-01 LAB — MAGNESIUM: Magnesium: 2.3 mg/dL (ref 1.7–2.4)

## 2017-12-01 MED ORDER — HALOPERIDOL LACTATE 2 MG/ML PO CONC
0.5000 mg | ORAL | Status: DC | PRN
  Filled 2017-12-01: qty 0.3

## 2017-12-01 MED ORDER — LORAZEPAM 2 MG/ML PO CONC: 1.0000 mg | ORAL | Status: DC | PRN

## 2017-12-01 MED ORDER — HYDROCOD POLST-CPM POLST ER 10-8 MG/5ML PO SUER
5.0000 mL | Freq: Two times a day (BID) | ORAL | Status: DC | PRN
  Administered 2017-12-01: 5 mL via ORAL
  Filled 2017-12-01: qty 5

## 2017-12-01 MED ORDER — LORAZEPAM 2 MG/ML IJ SOLN: 1.0000 mg | INTRAMUSCULAR | Status: DC | PRN

## 2017-12-01 MED ORDER — HALOPERIDOL LACTATE 5 MG/ML IJ SOLN: 0.5000 mg | INTRAMUSCULAR | Status: DC | PRN

## 2017-12-01 MED ORDER — HYDROMORPHONE HCL 1 MG/ML IJ SOLN
0.5000 mg | INTRAMUSCULAR | Status: DC | PRN
  Administered 2017-12-01: 0.5 mg via INTRAVENOUS
  Filled 2017-12-01 (×2): qty 1

## 2017-12-01 NOTE — Progress Notes (Signed)
[facility] - [place] at [facility]   PATIENT NAME: Perry [name]    MR#:  [id number]  DATE OF BIRTH:  September 07, 1970  SUBJECTIVE:  patient came in with increasing shortness of breath and was found to have low oxygen. He status post thoracentesis feels better. He is bedbound taken care of my mother at home. S/p thoracentesis, More lethargic and hypotensive transferred to stepdown.  On BiPAP.  Hypotensive on vasopressor support. After talking to intensivist, family has agreed on comfort care.  REVIEW OF SYSTEMS:   ROS   Pt is more lethargic today, can not give ROS.  Tolerating PT: bed bound  DRUG ALLERGIES:   Allergies  Allergen Reactions  . Depakote [Divalproex Sodium] Other (See Comments)    Hallucinations   . Hydromorphone Other (See Comments)    VITALS:  Blood pressure (!) 120/29, pulse 95, temperature 97.9 F (36.6 C), temperature source Axillary, resp. rate (!) 25, height 5\' 8"  (1.727 m), weight 73.7 kg, SpO2 100 %.  PHYSICAL EXAMINATION:   Physical Exam  GENERAL:  47 y.o.-year-old patient lying in the bed with no acute distress. obese EYES: Pupils equal, round, reactive to light and accommodation. No scleral icterus. Extraocular muscles intact.  HEENT: Head atraumatic, normocephalic. Oropharynx and nasopharynx clear.  NECK:  Supple, no jugular venous distention. No thyroid enlargement, no tenderness.  LUNGS: Normal breath sounds bilaterally, no wheezing, rales, rhonchi. No use of accessory muscles of respiration. Perm cath left upper chest, on BiPAP. CARDIOVASCULAR: S1, S2 normal. No murmurs, rubs, or gallops.  ABDOMEN: Soft, nontender, nondistended. Bowel sounds present. No organomegaly or mass.  EXTREMITIES: No cyanosis, clubbing or edema b/l.    NEUROLOGIC: Lethargic.  Chronic hemiplegia PSYCHIATRIC:  patient is Drowsy. SKIN: No obvious rash, lesion, or ulcer.   LABORATORY PANEL:  CBC Recent Labs  Lab 12/01/17 0458  WBC 10.5  HGB  11.1*  HCT 38.3*  PLT 390    Chemistries  Recent Labs  Lab 12/01/17 0458  NA 144  K 4.7  CL 103  CO2 21*  GLUCOSE 143*  BUN 45*  CREATININE 7.45*  CALCIUM 8.1*  MG 2.3  AST 281*  ALT 118*  ALKPHOS 76  BILITOT 1.7*   Cardiac Enzymes Recent Labs  Lab 12/10/17 0635  TROPONINI 0.06*   RADIOLOGY:  Dg Chest Port 1 View  Result Date: 12/01/2017 CLINICAL DATA:  Acute respiratory failure EXAM: PORTABLE CHEST 1 VIEW COMPARISON:  Dec 10, 2017 FINDINGS: The dialysis catheter is stable. Persistent but decreasing pulmonary opacities, right greater than left. No other interval changes. IMPRESSION: Stable dialysis catheter. Persistent but decreasing right greater than left pulmonary opacities. Electronically Signed   By: Gerome Sam III M.D   On: 12/01/2017 02:43   Dg Chest Port 1 View  Result Date: 10-Dec-2017 CLINICAL DATA:  Acute respiratory failure EXAM: PORTABLE CHEST 1 VIEW COMPARISON:  11/29/2017 and prior exams FINDINGS: Cardiomediastinal silhouette is unchanged. LEFT IJ central venous catheter with tip overlying the SUPERIOR cavoatrial junction and RIGHT subclavian stents again noted. Diffuse RIGHT lung airspace opacities and RIGHT pleural effusion appears slightly increased LEFT LOWER lung atelectasis/consolidation again noted. There is no evidence of pneumothorax. IMPRESSION: Apparent slight increase in diffuse RIGHT lung airspace disease and RIGHT pleural effusion. No other significant change. Continued LEFT LOWER lung atelectasis/consolidation. Electronically Signed   By: Harmon Pier M.D.   On: 12-10-17 09:28   ASSESSMENT AND PLAN:  Madix Blowe  is a 47 y.o. male with a known history of  anxiety, chronic back pain, end-stage renal disease on hemodialysis, stroke with hemiplegia and bedbound, recent admission for endocarditis in September 2019 and continued antibiotic until 1 November. For last 1 week patient has worsening in cough and hypoxia.  He was prescribed  oxygen by nephrologist office but his insurance company denied for that.  Continued to feel short of breath along with the cough so came to emergency room.  Noted to be slightly hypoxic but also have pleural effusion on chest x-ray   * Acute hypoxic respiratory failure due to Bilateral pleural effusion  Right >left - patient is status post thoracentesis with removal of 2 L yellow colored fluid- appears transudate. - sats 100% on 2liter-- wean to room air - DC IV heparin drip and resumed warfarin - CT scan of the chest which showed no loculated collection or lung injuries. -On BiPAP now.  * Hypotension due to sepsis and cardiogenic shock   Pt had some episodes, on midodrine but more today.   Move to stepdown and need levophed drip. Patient had endocarditis with significant valvular damages.  Requires levo fed and stays in stepdown.  *Recent endocarditis Currently no fever or elevated white blood cell count. Patient had finished antibiotic course on first November.  ID consult. Patient had significant valvular damage.  Repeat echocardiogram to check for current status.  *History of stroke and hemiplegia Patient is bedbound and lives with family.  *End-stage renal disease on hemodialysis Nephrologist involved to continue hemodialysis here.  Overall prognosis is very poor because of recent endocarditis, end-stage renal disease, hemiplegia and bedbound status. Family has agreed on comfort care and stopping hemodialysis.  Case discussed with Care Management/Social Worker. Management plans discussed with the patient, family and they are in agreement.  CODE STATUS: DNR  DVT Prophylaxis: heparin   Pt was personally seen and examined by me.  TOTAL TIME TAKING CARE OF THIS PATIENT: *30  minutes.  >50% time spent on counselling and coordination of care  POSSIBLE D/C IN *1-2* DAYS, DEPENDING ON CLINICAL CONDITION.  Note: This dictation was prepared with Dragon dictation along with  smaller phrase technology. Any transcriptional errors that result from this process are unintentional.  Altamese DillingVaibhavkumar Rasheida Broden M.D on 12/01/2017 at 3:24 PM  Between 7am to 6pm - Pager - [phone]  After 6pm go to [email or web address] - Social research officer, governmentpassword EPAS [facility]  [facility] [place]  Office  [phone]  CC: Primary care physician; [facility]: Dana Allanyrone E Paquette, male   DOB: 09-26-1970, 47 y.o.   MRN: [id number]

## 2017-12-01 NOTE — Progress Notes (Signed)
Daily Progress Note   Patient Name: Perry [name]       Date: 12/01/2017 DOB: 23-Apr-1970  Age: 47 y.o. MRN#: [id number] Attending Physician: Altamese Dilling, * Primary Care Physician: [facility], [facility] Admit Date: 12/04/2017  Reason for Consultation/Follow-up: Establishing goals of care, Non pain symptom management, Pain control, Psychosocial/spiritual support, Terminal Care and Withdrawal of life-sustaining treatment  Subjective: Patient opens eyes to voice, on bipap, does not follow commands. Mother at bedside.   Length of Stay: 4  Current Medications: Scheduled Meds:  . aspirin  81 mg Oral Daily  . Chlorhexidine Gluconate Cloth  6 each Topical Q0600  . cinacalcet  60 mg Oral QHS  . escitalopram  20 mg Oral QHS  . gabapentin  300 mg Oral BID  . hydrocortisone sod succinate (SOLU-CORTEF) inj  50 mg Intravenous Q12H  . midodrine  15 mg Oral Q8H  .  morphine injection  2 mg Intravenous Once  . multivitamin  1 tablet Oral QHS  . pantoprazole (PROTONIX) IV  40 mg Intravenous Daily  . senna-docusate  2 tablet Oral BID  . sevelamer carbonate  2.4 g Oral TID WC    Continuous Infusions: . sodium chloride    . sodium chloride    . ceFEPime (MAXIPIME) IV Stopped (11/24/2017 2351)  . norepinephrine (LEVOPHED) Adult infusion 30 mcg/min (12/01/17 0653)  . vancomycin    . vasopressin (PITRESSIN) infusion - *FOR SHOCK* 0.04 Units/min (12/01/17 0045)    PRN Meds: sodium chloride, sodium chloride, acetaminophen, alteplase, docusate sodium, heparin, ipratropium-albuterol, lidocaine (PF), lidocaine-prilocaine, pentafluoroprop-tetrafluoroeth, polyethylene glycol  Physical Exam         Constitutional: No distress.  HENT:  Head: Normocephalic and atraumatic.  Cardiovascular: Normal  rate and regular rhythm.  Pulmonary/Chest: Effort normal and breath sounds normal.  On bipap. Abdominal: Soft.  Musculoskeletal:  L hemiplegia  Neurological:  Opens eyes to voice, does not follow commands, drifts back to sleep  Skin: Skin is warm and dry.   Vital Signs: BP (!) 118/29   Pulse 94   Temp 98 F (36.7 C) (Axillary)   Resp (!) 23   Ht 5\' 8"  (1.727 m)   Wt 73.7 kg   SpO2 100%   BMI 24.70 kg/m  SpO2: SpO2: 100 % O2 Device: O2 Device: Bi-PAP O2 Flow Rate: O2 Flow Rate (L/min): 2 L/min  Intake/output summary:   Intake/Output Summary (Last 24 hours) at 12/01/2017 1027 Last data filed at 12/01/2017 0800 Gross per 24 hour  Intake 589.49 ml  Output 0 ml  Net 589.49 ml   LBM: Last BM Date: 12/06/2017 Baseline Weight: Weight: 77.2 kg Most recent weight: Weight: 73.7 kg       Palliative Assessment/Data: PPS 20%      Patient Active Problem List   Diagnosis Date Noted  . Demand ischemia (HCC)   . Acute respiratory failure with hypoxia (HCC) 11/22/2017  . Pleural effusion 12/04/2017  . Acute respiratory failure (HCC) 12/13/2017  . Chronic hypotension 10/08/2017  . Acute bacterial endocarditis   . MRSA bacteremia   . H/O: stroke   . Pressure injury of skin 09/22/2017  . Febrile illness 09/21/2017  . CVA (cerebral vascular accident) (HCC) 08/27/2017  .  Hallucinations   . Goals of care, counseling/discussion   . Palliative care by specialist   . End stage renal disease (HCC)   . Nonrheumatic aortic valve insufficiency   . Sepsis (HCC) 08/02/2017  . Solitary pulmonary nodule 05/08/2015  . CHF (congestive heart failure) (HCC) 05/06/2015  . Hyperkalemia 05/06/2015  . Acute encephalopathy 05/06/2015  . OSA on CPAP 05/06/2015  . Morbid obesity (HCC) 05/06/2015  . Fracture of transverse process of thoracic vertebra (HCC)   . 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 Enteritis due to Clostridium difficile   . Hypernatremia   . Convulsion (HCC)   . ESRD (end stage  renal disease) (HCC) 08/07/2014  . Altered mental status 08/07/2014  . 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 Epigastric abdominal pain   . Abdominal pain, epigastric 11/17/2013  . Hypotension 11/17/2013  . Abdominal pain 11/16/2013  . Seizures (HCC) 06/06/2012  . ESRD (end stage renal disease) on dialysis (HCC) 06/06/2012  . Anemia of chronic disease 06/06/2012  . Secondary hyperparathyroidism (of renal origin) 06/06/2012  . HTN (hypertension) 06/06/2012  . Arteriovenous fistula occlusion (HCC) 06/06/2012    Palliative Care Assessment & Plan   HPI: 47 y.o. male  with past medical history of ESRD on HD since 2011, CVA April 2019 and residual L sided hemiplegia and dysphagia, valvular heart disease, bed/wheelchair bound, seizures, OSA, anxiety, chronic back pain, and endocarditis admitted on 12/08/2017 with cough and shortness of breath. Chest x-ray revealed R moderate pleural effusion. Patient had thoracentesis 11/12 with 2L of fluid removed. Patient recently admitted 9/11-9/23 to [facility] for acute bacterial endocarditis and was on prolonged IV antibiotic therapy at home - this ended over a week ago. PMT consulted for GOC.  Assessment: Long conversation with patient's mother discussing patient's hospital diagnoses and clinical course. Participated in life review. Mother is very calm about situation and tells me she knew this was coming - she has been watching her son decline significantly over the past few months.   We discussed focusing on his comfort. She would like to wait on the patient's brother to arrive before removing bipap and pressors. We discussed that in the interim we would start comfort meds as needed, discontinue other medications not geared towards comfort, and not escalate care (not increasing pressors). She agrees to this plan. Once the patient's brother arrives, we will plan on removing the bipap and discontinuing pressors. Discussed plan with nursing and Dr.  Sherryll Burger.   She believes the brother will arrive about 5 or 6.   Mother does ask if patient will be able to go home. We discussed seeing how patient does after removal of bipap and pressors. I explained that I expected a hospital death but if he does stabilize overnight that we could attempt a transfer home with hospice care. She agrees to this.  Recommendations/Plan:  Brother to arrive this afternoon - mother agrees to removal of bipap and pressors at that time  All medications, CBG checks, and labs not geared towards comfort discontinued  Comfort medications ordered as needed - patient appears comfortable at this time - utilize prn dilaudid/ativan for discomfort/ dyspnea after bipap removed - Dilaudid preferred over morphine in setting of renal failure  Do not escalate care - no increase in pressors  Goals of Care and Additional Recommendations:  Limitations on Scope of Treatment: Full Comfort Care - maintain bipap and pressors until patient's brother arrives this afternoon, once brother arrives will d/c bipap and pressors, utilize prn dilaudid/ativan for  discomfort dyspnea  Code Status:  DNR  Prognosis:   Hours - Days  Discharge Planning:  Anticipated Hospital Death  Care plan was discussed with RN, patient's mother and brother  Thank you for allowing the Palliative Medicine Team to assist in the care of this patient.   Time In: 0930 Time Out: 1100 Total Time 90 minutes Prolonged Time Billed  yes       Greater than 50%  of this time was spent counseling and coordinating care related to the above assessment and plan.  Gerlean RenShae Lee Sherice Ijames, DNP, [facility] LtdGNP-C Palliative Medicine Team Team Phone # [phone]  Pager [phone]

## 2017-12-01 NOTE — Progress Notes (Signed)
[facility]  ROUNDING NOTE   Subjective:  Patient still critically ill. Blood pressure remains low despite 2 pressors. Mother at the bedside. Awaiting additional family members being made fully comfort care.  Objective:  Vital signs in last 24 hours:  Temp:  [98 F (36.7 C)-99.2 F (37.3 C)] 98 F (36.7 C) (11/15 0800) Pulse Rate:  [85-99] 94 (11/15 0856) Resp:  [15-25] 22 (11/15 0856) BP: (87-129)/(19-36) 100/21 (11/15 0800) SpO2:  [94 %-100 %] 100 % (11/15 0856) FiO2 (%):  [30 %] 30 % (11/14 1600)  Weight change:  Filed Weights   11/28/17 0021 11/29/17 1042  Weight: 77.2 kg 73.7 kg    Intake/Output: I/O last 3 completed shifts: In: 1434.3 [I.V.:1226; IV Piggyback:208.3] Out: -    Intake/Output this shift:  No intake/output data recorded.  Physical Exam: General: Critically ill appearing  Head: Normocephalic, atraumatic. Moist oral mucosal membranes  Eyes: Anicteric  Neck: Supple, trachea midline  Lungs:  Scattered rhonchi, on bipap  Heart: S1S2 no rubs  Abdomen:  Soft, nontender, bowel sounds present  Extremities: 1+ peripheral edema.  Neurologic: Lethargic but arousable  Skin: No lesions  Access: L IJ permcath    Basic Metabolic Panel: Recent Labs  Lab 11/28/17 0441 11/29/17 0955 11/29/17 1140 11/29/17 2340 11/18/2017 0343 12/01/17 0458  NA 142  --  145 143 143 144  K 4.1  --  4.1 5.5* 3.8 4.7  CL 104  --  103 106 107 103  CO2 26  --  26 22 25  21*  GLUCOSE 93  --  105* 149* 104* 143*  BUN 13  --  26* 31* 33* 45*  CREATININE 4.34*  --  6.42* 6.69* 6.72* 7.45*  CALCIUM 8.2*  --  8.4* 8.3* 8.4* 8.1*  MG  --   --   --  2.0 2.2 2.3  PHOS  --  5.4*  --  5.8* 4.5 5.8*    Liver Function Tests: Recent Labs  Lab 11/26/2017 1915 11/29/17 1140 12/15/2017 0343 12/01/17 0458  AST 28 12* 17 281*  ALT 11 9 9  118*  ALKPHOS 127* 117 109 76  BILITOT 1.4* 1.2 1.1 1.7*  PROT 5.9* 6.4* 6.3* 7.1  ALBUMIN 2.4* 2.9* 2.9* 4.5   No results for  input(s): LIPASE, AMYLASE in the last 168 hours. No results for input(s): AMMONIA in the last 168 hours.  CBC: Recent Labs  Lab 12/14/2017 1915 11/28/17 0441 11/29/17 1140 11/19/2017 0343 12/01/17 0458  WBC 6.9 6.3 16.1* 12.8* 10.5  NEUTROABS  --   --  14.6*  --  9.4*  HGB 12.1* 11.8* 11.8* 11.6* 11.1*  HCT 41.6 40.8 41.0 40.5 38.3*  MCV 93.1 92.3 93.8 95.3 92.1  PLT 369 414* 403* 410* 390    Cardiac Enzymes: Recent Labs  Lab 11/29/17 1140 11/29/17 1739 11/29/17 2340 12/13/2017 0635  TROPONINI 0.04* 0.08* 0.05* 0.06*    BNP: Invalid input(s): POCBNP  CBG: Recent Labs  Lab 11/29/2017 1553 11/19/2017 1942 12/15/2017 2328 12/01/17 0329 12/01/17 0801  GLUCAP 97 111* 90 107* 105*    Microbiology: Results for orders placed or performed during the hospital encounter of 12/12/2017  Culture, blood (single) w Reflex to ID Panel     Status: None (Preliminary result)   Collection Time: 12/02/2017  9:54 PM  Result Value Ref Range Status   Specimen Description BLOOD RIGHT ANTECUBITAL  Final   Special Requests   Final    BOTTLES DRAWN AEROBIC AND ANAEROBIC Blood Culture results may  not be optimal due to an excessive volume of blood received in culture bottles   Culture   Final    NO GROWTH 4 DAYS Performed at [facility], [address] [place], [place] [address]    Report Status PENDING  Incomplete  MRSA PCR Screening     Status: None   Collection Time: 11/28/17 12:31 AM  Result Value Ref Range Status   MRSA by PCR NEGATIVE NEGATIVE Final    Comment:        The GeneXpert MRSA Assay (FDA approved for NASAL specimens only), is one component of a comprehensive MRSA colonization surveillance program. It is not intended to diagnose MRSA infection nor to guide or monitor treatment for MRSA infections. Performed at [facility], [address] [place], [place] [address]   Body fluid culture     Status: None (Preliminary result)   Collection Time: 11/28/17   1:50 PM  Result Value Ref Range Status   Specimen Description   Final    PLEURAL Performed at [facility], [address] [place], [place] [address]    Special Requests   Final    NONE Performed at [facility], [address] [place], [place] [address]    Gram Stain NO WBC SEEN NO ORGANISMS SEEN   Final   Culture   Final    NO GROWTH 3 DAYS Performed at [facility], [address], [place], [place] [address]    Report Status PENDING  Incomplete  MRSA PCR Screening     Status: None   Collection Time: 11/29/17 10:41 AM  Result Value Ref Range Status   MRSA by PCR NEGATIVE NEGATIVE Final    Comment:        The GeneXpert MRSA Assay (FDA approved for NASAL specimens only), is one component of a comprehensive MRSA colonization surveillance program. It is not intended to diagnose MRSA infection nor to guide or monitor treatment for MRSA infections. Performed at [facility], [address] [place], [place] [address]   CULTURE, BLOOD (ROUTINE X 2) w Reflex to ID Panel     Status: None (Preliminary result)   Collection Time: 11/29/17  5:39 PM  Result Value Ref Range Status   Specimen Description BLOOD RIGHT THUMB  Final   Special Requests   Final    BOTTLES DRAWN AEROBIC ONLY Blood Culture results may not be optimal due to an inadequate volume of blood received in culture bottles   Culture   Final    NO GROWTH 2 DAYS Performed at [facility], [address], [place], [place] [address]    Report Status PENDING  Incomplete  CULTURE, BLOOD (ROUTINE X 2) w Reflex to ID Panel     Status: None (Preliminary result)   Collection Time: 11/29/17  6:00 PM  Result Value Ref Range Status   Specimen Description   Final    BLOOD PORTA CATH Performed at [facility], [address], [place], [place] [address]    Special Requests   Final    BOTTLES DRAWN AEROBIC AND ANAEROBIC Blood Culture results may not be optimal due to an  excessive volume of blood received in culture bottles Performed at [facility], [address] [place], [place] [address]    Culture  Setup Time ANAEROBIC BOTTLE ONLY  Final   Culture   Final    NO GROWTH 2 DAYS Performed at [facility], [address]  [place], [place] [address]    Report Status PENDING  Incomplete  Blood Culture ID Panel (Reflexed)     Status: None   Collection Time: 11/29/17  6:00 PM  Result Value Ref Range Status   Enterococcus species NOT DETECTED NOT DETECTED Final   Listeria monocytogenes NOT DETECTED NOT DETECTED Final   Staphylococcus species NOT DETECTED NOT DETECTED Final   Staphylococcus aureus (BCID) NOT DETECTED NOT DETECTED Final   Streptococcus species NOT DETECTED NOT DETECTED Final   Streptococcus agalactiae NOT DETECTED NOT DETECTED Final   Streptococcus pneumoniae NOT DETECTED NOT DETECTED Final   Streptococcus pyogenes NOT DETECTED NOT DETECTED Final   Acinetobacter baumannii NOT DETECTED NOT DETECTED Final   Enterobacteriaceae species NOT DETECTED NOT DETECTED Final   Enterobacter cloacae complex NOT DETECTED NOT DETECTED Final   Escherichia coli NOT DETECTED NOT DETECTED Final   Klebsiella oxytoca NOT DETECTED NOT DETECTED Final   Klebsiella pneumoniae NOT DETECTED NOT DETECTED Final   Proteus species NOT DETECTED NOT DETECTED Final   Serratia marcescens NOT DETECTED NOT DETECTED Final   Haemophilus influenzae NOT DETECTED NOT DETECTED Final   Neisseria meningitidis NOT DETECTED NOT DETECTED Final   Pseudomonas aeruginosa NOT DETECTED NOT DETECTED Final   Candida albicans NOT DETECTED NOT DETECTED Final   Candida glabrata NOT DETECTED NOT DETECTED Final   Candida krusei NOT DETECTED NOT DETECTED Final   Candida parapsilosis NOT DETECTED NOT DETECTED Final   Candida tropicalis NOT DETECTED NOT DETECTED Final    Comment: Performed at [facility], [address] [place], [place] [address]  Culture, respiratory      Status: None (Preliminary result)   Collection Time: 11/28/2017  5:27 AM  Result Value Ref Range Status   Specimen Description   Final    SPU Performed at [facility], [address] [place], [place] [address]    Special Requests   Final    NONE Performed at [facility], [address] [place], [place] [address]    Gram Stain   Final    MODERATE WBC PRESENT, PREDOMINANTLY PMN RARE GRAM POSITIVE RODS RARE GRAM NEGATIVE RODS RARE BUDDING YEAST SEEN RARE GRAM POSITIVE COCCI IN PAIRS FEW SQUAMOUS EPITHELIAL CELLS PRESENT Performed at [facility], [address] [place], [place] [address]    Culture PENDING  Incomplete   Report Status PENDING  Incomplete    Coagulation Studies: Recent Labs    11/29/17 0339 11/21/2017 0343 11/29/2017 0635  LABPROT 23.2* 53.6* 41.0*  INR 2.09 6.16* 4.36*    Urinalysis: No results for input(s): COLORURINE, LABSPEC, PHURINE, GLUCOSEU, HGBUR, BILIRUBINUR, KETONESUR, PROTEINUR, UROBILINOGEN, NITRITE, LEUKOCYTESUR in the last 72 hours.  Invalid input(s): APPERANCEUR    Imaging: Ct Head Wo Contrast  Result Date: 11/29/2017 CLINICAL DATA:  Altered mental status. History of end-stage renal disease. History of stroke in 04/2017 with residual left-sided weakness. EXAM: CT HEAD WITHOUT CONTRAST TECHNIQUE: Contiguous axial images were obtained from the base of the skull through the vertex without intravenous contrast. COMPARISON:  05/06/2015 head CT [facility]. 05/13/2017 head CT [facility] FINDINGS: Brain: A large right MCA territory infarct involves the frontal, parietal, and temporal lobes as well as insula and right lentiform nucleus, new from 2017 though reported on 04/2017 outside head CT. There is associated encephalomalacia with ex vacuo dilatation of the right lateral ventricle. Scattered gyral density within the infarct may reflect petechial blood products or mineralization. No definite acute infarct  is identified.  There is no significant midline shift or extra-axial fluid collection. Generalized cerebral atrophy is new from 2017 and advanced for age. Vascular: Calcified atherosclerosis at the skull base. No hyperdense vessel. Skull: No fracture or focal osseous lesion. Sinuses/Orbits: Visualized paranasal sinuses and mastoid air cells are clear. Visualized orbits are unremarkable. Other: None. IMPRESSION: 1. Large chronic right MCA infarct. 2. No definite acute intracranial abnormality. Electronically Signed   By: Sebastian Ache M.D.   On: 11/29/2017 10:52   US Venous Img Upper Uni Left  Result Date: 11/29/2017 CLINICAL DATA:  47 year old male with left upper extremity edema and a known occluded left arm arteriovenous fistula. EXAM: LEFT UPPER EXTREMITY VENOUS DOPPLER ULTRASOUND TECHNIQUE: Gray-scale sonography with graded compression, as well as color Doppler and duplex ultrasound were performed to evaluate the upper extremity deep venous system from the level of the subclavian vein and including the jugular, axillary, basilic, radial, ulnar and upper cephalic vein. Spectral Doppler was utilized to evaluate flow at rest and with distal augmentation maneuvers. COMPARISON:  None. FINDINGS: Contralateral Subclavian Vein: Respiratory phasicity is normal and symmetric with the symptomatic side. No evidence of thrombus. Normal compressibility. Internal Jugular Vein: The internal jugular vein is small in caliber and is not compressible. The lumen is filled with echogenic material. The findings are most consistent with chronic thrombus. There is evidence of partial flow on color Doppler imaging. Subclavian Vein: No evidence of thrombus. Normal compressibility, respiratory phasicity and response to augmentation. Axillary Vein: No evidence of thrombus. Normal compressibility, respiratory phasicity and response to augmentation. Cephalic Vein: No evidence of thrombus. Normal compressibility, respiratory phasicity and  response to augmentation. Basilic Vein: No evidence of thrombus. Normal compressibility, respiratory phasicity and response to augmentation. Brachial Veins: No evidence of thrombus. Normal compressibility, respiratory phasicity and response to augmentation. Radial Veins: No evidence of thrombus. Normal compressibility, respiratory phasicity and response to augmentation. Ulnar Veins: No evidence of thrombus. Normal compressibility, respiratory phasicity and response to augmentation. Venous Reflux:  None visualized. Other Findings:  Occluded arteriovenous fistula in the forearm. IMPRESSION: 1. Thrombosed left forearm arteriovenous fistula. 2. No extension of thrombus into the native deep venous system. 3. 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 Electronically Signed   By: Malachy Moan M.D.   On: 11/29/2017 15:31   Dg Chest Port 1 View  Result Date: 12/01/2017 CLINICAL DATA:  Acute respiratory failure EXAM: PORTABLE CHEST 1 VIEW COMPARISON:  2017/12/21 FINDINGS: The dialysis catheter is stable. Persistent but decreasing pulmonary opacities, right greater than left. No other interval changes. IMPRESSION: Stable dialysis catheter. Persistent but decreasing right greater than left pulmonary opacities. Electronically Signed   By: Gerome Sam III M.D   On: 12/01/2017 02:43   Dg Chest Port 1 View  Result Date: 12-21-2017 CLINICAL DATA:  Acute respiratory failure EXAM: PORTABLE CHEST 1 VIEW COMPARISON:  11/29/2017 and prior exams FINDINGS: Cardiomediastinal silhouette is unchanged. LEFT IJ central venous catheter with tip overlying the SUPERIOR cavoatrial junction and RIGHT subclavian stents again noted. Diffuse RIGHT lung airspace opacities and RIGHT pleural effusion appears slightly increased LEFT LOWER lung atelectasis/consolidation again noted. There is no evidence of pneumothorax. IMPRESSION:  Apparent slight increase in diffuse RIGHT lung airspace disease and RIGHT pleural effusion. No other significant change. Continued LEFT LOWER lung atelectasis/consolidation. Electronically Signed   By: Harmon Pier M.D.   On: December 21, 2017 09:28   Dg Chest Port 1 View  Result Date:  11/29/2017 CLINICAL DATA:  Acute respiratory failure.  Dialysis patient. EXAM: PORTABLE CHEST 1 VIEW COMPARISON:  11/28/2017 FINDINGS: Progression of bilateral airspace disease right greater than left. Probable edema versus pneumonia. Progression of small pleural effusions. Progression of left lower lobe consolidation Left jugular central venous catheter tip in the right atrium. Right subclavian vascular stents unchanged. IMPRESSION: Progression of bilateral airspace disease right greater than left and small effusions most consistent with fluid overload and edema. Electronically Signed   By: Marlan Palau M.D.   On: 11/29/2017 11:41     Medications:   . sodium chloride    . sodium chloride    . ceFEPime (MAXIPIME) IV Stopped (December 19, 2017 2351)  . norepinephrine (LEVOPHED) Adult infusion 30 mcg/min (12/01/17 0653)  . vancomycin    . vasopressin (PITRESSIN) infusion - *FOR SHOCK* 0.04 Units/min (12/01/17 0045)   . aspirin  81 mg Oral Daily  . Chlorhexidine Gluconate Cloth  6 each Topical Q0600  . cinacalcet  60 mg Oral QHS  . escitalopram  20 mg Oral QHS  . gabapentin  300 mg Oral BID  . hydrocortisone sod succinate (SOLU-CORTEF) inj  50 mg Intravenous Q12H  . midodrine  15 mg Oral Q8H  .  morphine injection  2 mg Intravenous Once  . multivitamin  1 tablet Oral QHS  . pantoprazole (PROTONIX) IV  40 mg Intravenous Daily  . senna-docusate  2 tablet Oral BID  . sevelamer carbonate  2.4 g Oral TID WC   sodium chloride, sodium chloride, acetaminophen, alteplase, docusate sodium, heparin, ipratropium-albuterol, lidocaine (PF), lidocaine-prilocaine, pentafluoroprop-tetrafluoroeth, polyethylene glycol  Assessment/ Plan:   48 y.o. 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  [facility]/MWF  1.  ESRD on HD MWF.  2D echocardiogram performed today which demonstrated thrombus at the end of his PermCath.  This could potentially be infected.  Emily favoring comfort care at this point in time.  Therefore to avoid any additional pain we decided to hold off on removing PermCath.  No further plans for dialysis.  2.  Anemia chronic kidney disease.  Further plans for Epogen.  3.  Secondary hyperparathyroidism.  She is on BiPAP and not receiving binders.  4.  Pleural effusion.  Status post thoracentesis this admission.  5.  Hypotension.  Continue vasopressin as well as norepinephrine until additional family members arrive.   LOS: 4 Osie Amparo 11/15/20199:17 AM

## 2017-12-01 NOTE — Progress Notes (Signed)
Pressors and Bi-Pap discontinued at this time. Family at bedside.

## 2017-12-01 NOTE — Progress Notes (Signed)
?  pt is now comfort care and off all antibiotics Will sign off

## 2017-12-01 NOTE — Progress Notes (Signed)
Patient's family at bedside and will let us know when they are ready to discontinue the pressors and Bi-Pap. This RN told them to take their time and let us know if they need anything in the meantime. Comfort cart provided for the family.

## 2017-12-01 NOTE — Progress Notes (Signed)
[facility] [place] at [facility]   PATIENT NAME: Perry [name]    MR#:  [id number]  DATE OF BIRTH:  [birth year]  SUBJECTIVE:  patient came in with increasing shortness of breath and was found to have low oxygen. He status post thoracentesis feels better. He is bedbound taken care of my mother at home. S/p thoracentesis, More lethargic and hypotensive transferred to stepdown.  On BiPAP.  Hypotensive on vasopressor support.  REVIEW OF SYSTEMS:   ROS Pt is more lethargic today, can not give ROS.  Tolerating PT: bed bound  DRUG ALLERGIES:   Allergies  Allergen Reactions  . Depakote [Divalproex Sodium] Other (See Comments)    Hallucinations   . Hydromorphone Other (See Comments)    VITALS:  Blood pressure (!) 120/29, pulse 95, temperature 97.9 F (36.6 C), temperature source Axillary, resp. rate (!) 25, height 5\' 8"  (1.727 m), weight 73.7 kg, SpO2 100 %.  PHYSICAL EXAMINATION:   Physical Exam  GENERAL:  47 y.o.-year-old patient lying in the bed with no acute distress. obese EYES: Pupils equal, round, reactive to light and accommodation. No scleral icterus. Extraocular muscles intact.  HEENT: Head atraumatic, normocephalic. Oropharynx and nasopharynx clear.  NECK:  Supple, no jugular venous distention. No thyroid enlargement, no tenderness.  LUNGS: Normal breath sounds bilaterally, no wheezing, rales, rhonchi. No use of accessory muscles of respiration. Perm cath left upper chest, on BiPAP. CARDIOVASCULAR: S1, S2 normal. No murmurs, rubs, or gallops.  ABDOMEN: Soft, nontender, nondistended. Bowel sounds present. No organomegaly or mass.  EXTREMITIES: No cyanosis, clubbing or edema b/l.    NEUROLOGIC: Lethargic.  Chronic hemiplegia PSYCHIATRIC:  patient is Drowsy. SKIN: No obvious rash, lesion, or ulcer.   LABORATORY PANEL:  CBC Recent Labs  Lab 12/01/17 0458  WBC 10.5  HGB 11.1*  HCT 38.3*  PLT 390    Chemistries  Recent Labs  Lab  12/01/17 0458  NA 144  K 4.7  CL 103  CO2 21*  GLUCOSE 143*  BUN 45*  CREATININE 7.45*  CALCIUM 8.1*  MG 2.3  AST 281*  ALT 118*  ALKPHOS 76  BILITOT 1.7*   Cardiac Enzymes Recent Labs  Lab 12/13/2017 0635  TROPONINI 0.06*   RADIOLOGY:  [place] Venous Img Upper Uni Left  Result Date: 11/29/2017 CLINICAL DATA:  47 year old male with left upper extremity edema and a known occluded left arm arteriovenous fistula. EXAM: LEFT UPPER EXTREMITY VENOUS DOPPLER ULTRASOUND TECHNIQUE: Gray-scale sonography with graded compression, as well as color Doppler and duplex ultrasound were performed to evaluate the upper extremity deep venous system from the level of the subclavian vein and including the jugular, axillary, basilic, radial, ulnar and upper cephalic vein. Spectral Doppler was utilized to evaluate flow at rest and with distal augmentation maneuvers. COMPARISON:  None. FINDINGS: Contralateral Subclavian Vein: Respiratory phasicity is normal and symmetric with the symptomatic side. No evidence of thrombus. Normal compressibility. Internal Jugular Vein: The internal jugular vein is small in caliber and is not compressible. The lumen is filled with echogenic material. The findings are most consistent with chronic thrombus. There is evidence of partial flow on color Doppler imaging. Subclavian Vein: No evidence of thrombus. Normal compressibility, respiratory phasicity and response to augmentation. Axillary Vein: No evidence of thrombus. Normal compressibility, respiratory phasicity and response to augmentation. Cephalic Vein: No evidence of thrombus. Normal compressibility, respiratory phasicity and response to augmentation. Basilic Vein: No evidence of thrombus. Normal compressibility, respiratory phasicity and response to augmentation. Brachial Veins: No evidence  of thrombus. Normal compressibility, respiratory phasicity and response to augmentation. Radial Veins: No evidence of thrombus. Normal  compressibility, respiratory phasicity and response to augmentation. Ulnar Veins: No evidence of thrombus. Normal compressibility, respiratory phasicity and response to augmentation. Venous Reflux:  None visualized. Other Findings:  Occluded arteriovenous fistula in the forearm. IMPRESSION: 1. Thrombosed left forearm arteriovenous fistula. 2. No extension of thrombus into the native deep venous system. 3. 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 Electronically Signed   By: Malachy Moan M.D.   On: 11/29/2017 15:31   Dg Chest Port 1 View  Result Date: 12/01/2017 CLINICAL DATA:  Acute respiratory failure EXAM: PORTABLE CHEST 1 VIEW COMPARISON:  2017/12/15 FINDINGS: The dialysis catheter is stable. Persistent but decreasing pulmonary opacities, right greater than left. No other interval changes. IMPRESSION: Stable dialysis catheter. Persistent but decreasing right greater than left pulmonary opacities. Electronically Signed   By: Gerome Sam III M.D   On: 12/01/2017 02:43   Dg Chest Port 1 View  Result Date: 12-15-17 CLINICAL DATA:  Acute respiratory failure EXAM: PORTABLE CHEST 1 VIEW COMPARISON:  11/29/2017 and prior exams FINDINGS: Cardiomediastinal silhouette is unchanged. LEFT IJ central venous catheter with tip overlying the SUPERIOR cavoatrial junction and RIGHT subclavian stents again noted. Diffuse RIGHT lung airspace opacities and RIGHT pleural effusion appears slightly increased LEFT LOWER lung atelectasis/consolidation again noted. There is no evidence of pneumothorax. IMPRESSION: Apparent slight increase in diffuse RIGHT lung airspace disease and RIGHT pleural effusion. No other significant change. Continued LEFT LOWER lung atelectasis/consolidation. Electronically Signed   By: Harmon Pier M.D.   On: 2017/12/15 09:28   ASSESSMENT AND PLAN:  Perry [name]  is a 47 y.o. male with a  known history of anxiety, chronic back pain, end-stage renal disease on hemodialysis, stroke with hemiplegia and bedbound, recent admission for endocarditis in September 2019 and continued antibiotic until 1 November. For last 1 week patient has worsening in cough and hypoxia.  He was prescribed oxygen by nephrologist office but his insurance company denied for that.  Continued to feel short of breath along with the cough so came to emergency room.  Noted to be slightly hypoxic but also have pleural effusion on chest x-ray   * Acute hypoxic respiratory failure due to Bilateral pleural effusion  Right >left - patient is status post thoracentesis with removal of 2 L yellow colored fluid- appears transudate. - sats 100% on 2liter-- wean to room air - DC IV heparin drip and resumed warfarin - CT scan of the chest which showed no loculated collection or lung injuries. -On BiPAP now.  * Hypotension due to sepsis and cardiogenic shock   Pt had some episodes, on midodrine but more today.   Move to stepdown and need levophed drip. Patient had endocarditis with significant valvular damages.  Requires levo fed and stays in stepdown.  *Recent endocarditis Currently no fever or elevated white blood cell count. Patient had finished antibiotic course on first November.  ID consult. Patient had significant valvular damage.  Repeat echocardiogram to check for current status.  *History of stroke and hemiplegia Patient is bedbound and lives with family.  *End-stage renal disease on hemodialysis Nephrologist involved to continue hemodialysis here.  Overall prognosis is very poor because of recent endocarditis, end-stage renal disease, hemiplegia and bedbound status.   Case discussed with Care Management/Social Worker. Management plans discussed with the patient, family and they are in  agreement.  CODE STATUS: DNR  DVT Prophylaxis: heparin   Pt was personally seen and examined by me.  TOTAL  TIME TAKING CARE OF THIS PATIENT: *35  minutes.  >50% time spent on counselling and coordination of care  POSSIBLE D/C IN *1-2* DAYS, DEPENDING ON CLINICAL CONDITION.  Note: This dictation was prepared with Dragon dictation along with smaller phrase technology. Any transcriptional errors that result from this process are unintentional.  Altamese Dilling M.D on 12/01/2017 at 3:21 PM  Between 7am to 6pm - Pager - [phone]  After 6pm go to [email or web address] - Social research officer, government  [facility]  Office  [phone]  CC: Primary care physician; [facility]: [facility] [name], male   DOB: February 19, 1970, 47 y.o.   MRN: [id number]
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
## 2017-12-02 ENCOUNTER — Other Ambulatory Visit: Payer: Self-pay

## 2017-12-02 LAB — BLOOD GAS, ARTERIAL
ACID-BASE DEFICIT: 2.3 mmol/L — AB (ref 0.0–2.0)
Bicarbonate: 25 mmol/L (ref 20.0–28.0)
FIO2: 0.28
O2 Saturation: 85.5 %
PCO2 ART: 52 mmHg — AB (ref 32.0–48.0)
PH ART: 7.29 — AB (ref 7.350–7.450)
PO2 ART: 57 mmHg — AB (ref 83.0–108.0)
Patient temperature: 37

## 2017-12-02 LAB — CULTURE, BLOOD (SINGLE): Culture: NO GROWTH

## 2017-12-02 LAB — CULTURE, RESPIRATORY W GRAM STAIN

## 2017-12-02 LAB — CULTURE, RESPIRATORY

## 2017-12-02 MED ORDER — SODIUM CHLORIDE 0.9% FLUSH: 10.0000 mL | INTRAVENOUS | Status: DC | PRN

## 2017-12-02 MED ORDER — SODIUM CHLORIDE 0.9% FLUSH
10.0000 mL | INTRAVENOUS | Status: AC | PRN
  Administered 2017-12-02: 08:00:00 10 mL

## 2017-12-02 MED ORDER — SODIUM CHLORIDE 0.9% FLUSH
10.0000 mL | INTRAVENOUS | Status: DC | PRN
  Administered 2017-12-02 – 2017-12-03 (×2): 10 mL
  Filled 2017-12-02 (×2): qty 10

## 2017-12-02 MED ORDER — SODIUM CHLORIDE 0.9% FLUSH
3.0000 mL | Freq: Two times a day (BID) | INTRAVENOUS | Status: DC
  Administered 2017-12-02 – 2017-12-05 (×7): 3 mL via INTRAVENOUS

## 2017-12-02 MED ORDER — SODIUM CHLORIDE 0.9% FLUSH
10.0000 mL | INTRAVENOUS | Status: DC | PRN
  Administered 2017-12-02: 08:00:00 10 mL
  Filled 2017-12-02: qty 10

## 2017-12-02 NOTE — Progress Notes (Signed)
[employer] [facility] - [place] at [facility]   PATIENT NAME: Perry [name]    MR#:  [id number]  DATE OF BIRTH:  October 07, 1970  SUBJECTIVE:   Patient on comfort care Family at bedside  REVIEW OF SYSTEMS:    Unable to obtain    Tolerating Diet: npo      DRUG ALLERGIES:   Allergies  Allergen Reactions  . Depakote [Divalproex Sodium] Other (See Comments)    Hallucinations   . Hydromorphone Other (See Comments)    VITALS:  Blood pressure (!) 100/22, pulse 97, temperature 98.4 F (36.9 C), temperature source Axillary, resp. rate (!) 24, height 5\' 8"  (1.727 m), weight 73.7 kg, SpO2 100 %.  PHYSICAL EXAMINATION:  Constitutional: Increased work of breathing  lungs coarse breath sounds     LABORATORY PANEL:   CBC Recent Labs  Lab 12/01/17 0458  WBC 10.5  HGB 11.1*  HCT 38.3*  PLT 390   ------------------------------------------------------------------------------------------------------------------  Chemistries  Recent Labs  Lab 12/01/17 0458  NA 144  K 4.7  CL 103  CO2 21*  GLUCOSE 143*  BUN 45*  CREATININE 7.45*  CALCIUM 8.1*  MG 2.3  AST 281*  ALT 118*  ALKPHOS 76  BILITOT 1.7*   ------------------------------------------------------------------------------------------------------------------  Cardiac Enzymes Recent Labs  Lab 11/29/17 1739 11/29/17 2340 12/15/2017 0635  TROPONINI 0.08* 0.05* 0.06*   ------------------------------------------------------------------------------------------------------------------  RADIOLOGY:  Dg Chest Port 1 View  Result Date: 12/01/2017 CLINICAL DATA:  Acute respiratory failure EXAM: PORTABLE CHEST 1 VIEW COMPARISON:  November 30, 2017 FINDINGS: The dialysis catheter is stable. Persistent but decreasing pulmonary opacities, right greater than left. No other interval changes. IMPRESSION: Stable dialysis catheter. Persistent but decreasing right greater than left pulmonary opacities. Electronically  Signed   By: Gerome Samavid  Williams III M.D   On: 12/01/2017 02:43     ASSESSMENT AND PLAN:   47 year old male with history of end-stage renal disease on hemodialysis, stroke with hemiplegia and bedbound who is admitted for shortness of breath.  1.  Acute hypoxic respiratory failure due to bilateral pleural effusion status post thoracentesis due to chronic diastolic heart failure Patient now on comfort care 2.  Hypotension with cardiogenic shock and sepsis  3.  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  History of CVA and hemiplegia  5.  End-stage renal disease    Patient weaned off of BiPAP.  Patient is on comfort care      CODE STATUS: dnr  TOTAL TIME TAKING CARE OF THIS PATIENT: 11 minutes.      Jacilyn Sanpedro M.D on 12/02/2017 at 10:52 AM  Between 7am to 6pm - Pager - [phone] After 6pm go to [email or web address] - Social research officer, governmentpassword [email or web address]  [facility] [place] [facility]  Office  [phone]  CC: Primary care physician; Center, [facility]  Note: This dictation was prepared with Dragon dictation along with smaller phrase technology. Any transcriptional errors that result from this process are unintentional.

## 2017-12-02 NOTE — Progress Notes (Signed)
Mother and friend at bedside and others. Pt unresponsive with no sign/symptom pain.  Respirations becoming more shallow, irregular. Comfort care measures continued with emotional support given to family. Oral care performed.

## 2017-12-03 MED ORDER — GLYCOPYRROLATE 0.2 MG/ML IJ SOLN
0.2000 mg | INTRAMUSCULAR | Status: DC | PRN
  Administered 2017-12-03: 17:00:00 0.2 mg via INTRAVENOUS
  Filled 2017-12-03 (×2): qty 1

## 2017-12-03 NOTE — Progress Notes (Signed)
Pt resting comfortably.  No signs of pain.  Irregular respirations. Robinul given once for secretions with improvement.  Oral care provided.  Turned pt per family request.  Family at the bedside.

## 2017-12-03 NOTE — Progress Notes (Signed)
[employer] - [place] at [facility]   PATIENT NAME: Perry [name]    MR#:  [id number]  DATE OF BIRTH:  [birth year]  SUBJECTIVE:   Patient on comfort care No family at bedside REVIEW OF SYSTEMS:    Unable to obtain    Tolerating Diet: npo      DRUG ALLERGIES:   Allergies  Allergen Reactions  . Depakote [Divalproex Sodium] Other (See Comments)    Hallucinations   . Hydromorphone Other (See Comments)    VITALS:  Blood pressure (!) 91/26, pulse 80, temperature 97.6 F (36.4 C), temperature source Oral, resp. rate 20, height 5\' 8"  (1.727 m), weight 73.7 kg, SpO2 100 %.  PHYSICAL EXAMINATION:  Constitutional: Slow shallow breath sounds lungs coarse breath sounds     LABORATORY PANEL:   CBC Recent Labs  Lab 12/01/17 0458  WBC 10.5  HGB 11.1*  HCT 38.3*  PLT 390   ------------------------------------------------------------------------------------------------------------------  Chemistries  Recent Labs  Lab 12/01/17 0458  NA 144  K 4.7  CL 103  CO2 21*  GLUCOSE 143*  BUN 45*  CREATININE 7.45*  CALCIUM 8.1*  MG 2.3  AST 281*  ALT 118*  ALKPHOS 76  BILITOT 1.7*   ------------------------------------------------------------------------------------------------------------------  Cardiac Enzymes Recent Labs  Lab 11/29/17 1739 11/29/17 2340 11/20/2017 0635  TROPONINI 0.08* 0.05* 0.06*   ------------------------------------------------------------------------------------------------------------------  RADIOLOGY:  No results found.   ASSESSMENT AND PLAN:   47 year old male with history of end-stage renal disease on hemodialysis, stroke with hemiplegia and bedbound who is admitted for shortness of breath.  1.  Acute hypoxic respiratory failure due to bilateral pleural effusion status post thoracentesis due to chronic diastolic heart failure Patient now on comfort care 2.  Hypotension with cardiogenic shock and sepsis  3.   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  History of CVA and hemiplegia  5.  End-stage renal disease    Patient weaned off of BiPAP.  Patient is on comfort care      CODE STATUS: dnr  TOTAL TIME TAKING CARE OF THIS PATIENT: 11 minutes.      Kaian Fahs M.D on 12/03/2017 at 10:46 AM  Between 7am to 6pm - Pager - [phone] After 6pm go to [email or web address] - Social research officer, governmentpassword EPAS [email or web address]  [facility] [place] [facility]  Office  [phone]  CC: Primary care physician; Center, [facility]  Note: This dictation was prepared with Dragon dictation along with smaller phrase technology. Any transcriptional errors that result from this process are unintentional.

## 2017-12-04 LAB — CULTURE, BLOOD (ROUTINE X 2)
CULTURE: NO GROWTH
Culture: NO GROWTH

## 2017-12-04 NOTE — Progress Notes (Signed)
[employer] [facility] - [place] at [facility]   PATIENT NAME: Perry [name]    MR#:  [id number]  DATE OF BIRTH:  02/26/1970  SUBJECTIVE:   Patient remains on comfort care REVIEW OF SYSTEMS:    Unable to obtain    Tolerating Diet: npo      DRUG ALLERGIES:   Allergies  Allergen Reactions  . Depakote [Divalproex Sodium] Other (See Comments)    Hallucinations   . Hydromorphone Other (See Comments)    VITALS:  Blood pressure (!) 91/26, pulse 80, temperature 97.6 F (36.4 C), temperature source Oral, resp. rate (!) 21, height 5\' 8"  (1.727 m), weight 73.7 kg, SpO2 100 %.  PHYSICAL EXAMINATION:  Constitutional: Slow shallow breath sounds lungs coarse breath sounds     LABORATORY PANEL:   CBC Recent Labs  Lab 12/01/17 0458  WBC 10.5  HGB 11.1*  HCT 38.3*  PLT 390   ------------------------------------------------------------------------------------------------------------------  Chemistries  Recent Labs  Lab 12/01/17 0458  NA 144  K 4.7  CL 103  CO2 21*  GLUCOSE 143*  BUN 45*  CREATININE 7.45*  CALCIUM 8.1*  MG 2.3  AST 281*  ALT 118*  ALKPHOS 76  BILITOT 1.7*   ------------------------------------------------------------------------------------------------------------------  Cardiac Enzymes Recent Labs  Lab 11/29/17 1739 11/29/17 2340 12/01/2017 0635  TROPONINI 0.08* 0.05* 0.06*   ------------------------------------------------------------------------------------------------------------------  RADIOLOGY:  No results found.   ASSESSMENT AND PLAN:   47 year old male with history of end-stage renal disease on hemodialysis, stroke with hemiplegia and bedbound who is admitted for shortness of breath.  1.  Acute hypoxic respiratory failure due to bilateral pleural effusion status post thoracentesis due to chronic diastolic heart failure Patient now on comfort care 2.  Hypotension with cardiogenic shock and sepsis  3.  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  History of CVA and hemiplegia  5.  End-stage renal disease    Patient weaned off of BiPAP.  Patient is on comfort care      CODE STATUS: dnr  TOTAL TIME TAKING CARE OF THIS PATIENT: 8 minutes.      Marlaya Turck M.D on 12/04/2017 at 10:03 AM  Between 7am to 6pm - Pager - [phone] After 6pm go to [email or web address] - Social research officer, governmentpassword EPAS [facility]  [facility] [place] [facility]  Office  [phone]  CC: Primary care physician; Center, [facility]  Note: This dictation was prepared with Dragon dictation along with smaller phrase technology. Any transcriptional errors that result from this process are unintentional.

## 2017-12-04 NOTE — Care Management (Signed)
Comfort Measures continue. Palliative consult in progress.  Gwenette GreetBrenda S Yvaine Jankowiak RN MSN CCM Care Management [phone]

## 2017-12-04 NOTE — Progress Notes (Signed)
Please note, patient is currently followed by outpatient Palliative at home. CMRN Brenda Holland made aware.  °Karen Robertson RN, BSN, CHPN °[facility], hospital Liaison °[phone] °

## 2017-12-05 DIAGNOSIS — Z515 Encounter for palliative care: Secondary | ICD-10-CM

## 2017-12-05 DIAGNOSIS — Z66 Do not resuscitate: Secondary | ICD-10-CM

## 2017-12-05 DIAGNOSIS — R0602 Shortness of breath: Secondary | ICD-10-CM

## 2017-12-05 MED ORDER — SCOPOLAMINE 1 MG/3DAYS TD PT72
1.0000 | MEDICATED_PATCH | TRANSDERMAL | Status: DC
  Administered 2017-12-05: 10:00:00 1.5 mg via TRANSDERMAL
  Filled 2017-12-05: qty 1

## 2017-12-05 MED ORDER — GLYCOPYRROLATE 0.2 MG/ML IJ SOLN
0.4000 mg | INTRAMUSCULAR | Status: DC
  Filled 2017-12-05 (×3): qty 2

## 2017-12-17 NOTE — Progress Notes (Signed)
Patient pronounced deceased at 1254.

## 2017-12-17 NOTE — Progress Notes (Signed)
   11/18/2017 1400  Clinical Encounter Type  Visited With Family  Visit Type Follow-up  Recommendations continue to follow  Spiritual Encounters  Spiritual Needs Grief support   Chaplain followed back up with patient's family; patient's brother has arrived.  Family requested chaplain come back in approximately 30 minutes.

## 2017-12-17 NOTE — Discharge Summary (Signed)
Death summary  Date of admission 2017-06-23 Date of death 11/30/2017  47 year old male with history of end-stage renal disease on hemodialysis, stroke with hemiplegia and bedbound who is admitted for shortness of breath.  1.  Acute hypoxic respiratory failure due to bilateral pleural effusion status post thoracentesis due to chronic diastolic heart failure Patient now on comfort care 2.  Hypotension with cardiogenic shock and sepsis  3.  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  History of CVA and hemiplegia  5.  End-stage renal disease    Patient weaned off of BiPAP.    Strict comfort care measures implemented and patient deceased comfortably at 12:54 PM.  Family members at bedside  Time spent 36 min

## 2017-12-17 NOTE — Progress Notes (Signed)
   11/21/17 1430  Clinical Encounter Type  Visited With Family  Visit Type Follow-up  Spiritual Encounters  Spiritual Needs Grief support   Chaplain followed up with patient's family, engaged in dialogue regarding grief and self-care.  Patient's aunt spoke about similarity of patient's condition to that of her deceased mother.  Chaplain expressed condolences and encouraged family to reach out if needed before they leave today.

## 2017-12-17 NOTE — Progress Notes (Signed)
Nutrition Brief Note  Patient identified to be seen for Malnutrition Screening Tool (MST). Chart reviewed. Patient has transitioned to comfort care.   No nutrition interventions warranted at this time. Please consult RD as needed.   Linsie Lupo, MS, RD, LDN Office: [phone] Pager: [phone] After Hours/Weekend Pager: [phone]  

## 2017-12-17 NOTE — Progress Notes (Signed)
   12/16/2017 1304  Clinical Encounter Type  Visited With Family  Visit Type Death  Referral From Nurse  Recommendations continue to follow  Spiritual Encounters  Spiritual Needs Grief support   Chaplain responded to page regarding family support due to patient's death.  When chaplain checked with family, they indicated that they would like some time alone together.  Chaplain will continue to monitor.

## 2017-12-17 NOTE — Progress Notes (Signed)
Daily Progress Note   Patient Name: Perry [name]       Date: 10-03-17 DOB: [birth year]  Age: 47 y.o. MRN#: [id number] Attending Physician: Ramonita LabGouru, Aruna, MD Primary Care Physician: Center, [facility] Admit Date: 12/08/2017  Reason for Consultation/Follow-up: Establishing goals of care  Subjective: Patient is actively dying. Increase in secretions. Audible gurgling. Accessory muscle usage and tachypnea with apnea periods. Family is at the bedside (mother, stepfather, aunt, and sister). I discussed changes in patients respiratory pattern and increase in secretions. Family verbalized understanding. Support and comfort provided. Discussed with family that patient most likely would pass away today given changes. Family tearful yet verbalized understanding. Spent time allowing mother to express feelings and share memories.   Discussed with [name], RN in regards to administering Robinul and Dilaudid for air hunger and secretions. She is preparing to administer.   1254: RN entered into room to administer medications and patient had no signs of respiratory effort. No pulse. I assessed patient and no heart sounds or lung sounds audible on assessment. Family informed patient had passed away. Family tearful, mother expressed feelings of being hurt by his death and difficulties in losing a child. Support and comfort provided. Offered chaplain support. Family verbalized appreciation and request.   Length of Stay: 8  Current Medications: Scheduled Meds:  . glycopyrrolate  0.4 mg Intravenous Q4H  . scopolamine  1 patch Transdermal Q72H  . sodium chloride flush  3 mL Intravenous Q12H    Continuous Infusions:   PRN Meds: haloperidol **OR** haloperidol lactate, HYDROmorphone (DILAUDID)  injection, LORazepam **OR** LORazepam, sodium chloride flush  Physical Exam  Constitutional: He appears ill.  Actively dying   Cardiovascular: An irregular rhythm present. Exam reveals gallop, S3 and decreased pulses.  Pulmonary/Chest: Accessory muscle usage present. He has decreased breath sounds. He has rhonchi.  Excessive secretions audible gurgling   Abdominal: Soft. Bowel sounds are decreased.  Neurological: He is unresponsive.  Skin: Skin is warm and dry. Bruising noted.  Mottling   Nursing note reviewed.           Vital Signs: BP (!) 123/38 (BP Location: Right Arm)   Pulse (!) 110   Temp (!) 101.6 F (38.7 C) (Oral)   Resp (!) 26   Ht 5\' 8"  (1.727 m)   Wt 73.7 kg  SpO2 91%   BMI 24.70 kg/m  SpO2: SpO2: 91 % O2 Device: O2 Device: Nasal Cannula O2 Flow Rate: O2 Flow Rate (L/min): 2 L/min  Intake/output summary:   Intake/Output Summary (Last 24 hours) at 2017-12-16 1221 Last data filed at 12/04/2017 1354 Gross per 24 hour  Intake -  Output 0 ml  Net 0 ml   LBM: Last BM Date: 12/01/17 Baseline Weight: Weight: 77.2 kg Most recent weight: Weight: 73.7 kg       Palliative Assessment/Data: PPS 0%    Flowsheet Rows     Most Recent Value  Intake Tab  Referral Department  Hospitalist  Unit at Time of Referral  Med/Surg Unit  Palliative Care Primary Diagnosis  Nephrology  Date Notified  12/09/2017  Palliative Care Type  New Palliative care  Reason for referral  Clarify Goals of Care  Date of Admission  12/10/2017  Date first seen by Palliative Care  11/28/17  # of days Palliative referral response time  1 Day(s)  # of days IP prior to Palliative referral  0  Clinical Assessment  Palliative Performance Scale Score  20%  Psychosocial & Spiritual Assessment  Palliative Care Outcomes  Patient/Family meeting held?  Yes  Who was at the meeting?  mother  Palliative Care Outcomes  Clarified goals of care, Provided end of life care assistance, Provided psychosocial or  spiritual support, Improved non-pain symptom therapy, Linked to palliative care logitudinal support, Changed to focus on comfort      Patient Active Problem List   Diagnosis Date Noted  . Comfort measures only status   . Demand ischemia (HCC)   . Acute respiratory failure with hypoxia (HCC) 12/15/2017  . Pleural effusion 12/02/2017  . Acute respiratory failure (HCC) 11/21/2017  . Chronic hypotension 10/08/2017  . Acute bacterial endocarditis   . MRSA bacteremia   . H/O: stroke   . Pressure injury of skin 09/22/2017  . Febrile illness 09/21/2017  . CVA (cerebral vascular accident) (HCC) 08/27/2017  . Hallucinations   . Goals of care, counseling/discussion   . Palliative care by specialist   . End stage renal disease (HCC)   . Nonrheumatic aortic valve insufficiency   . Sepsis (HCC) 08/02/2017  . Solitary pulmonary nodule 05/08/2015  . CHF (congestive heart failure) (HCC) 05/06/2015  . Hyperkalemia 05/06/2015  . Acute encephalopathy 05/06/2015  . OSA on CPAP 05/06/2015  . Morbid obesity (HCC) 05/06/2015  . Fracture of transverse process of thoracic vertebra (HCC)   . 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 Enteritis due to Clostridium difficile   . Hypernatremia   . Convulsion (HCC)   . ESRD (end stage renal disease) (HCC) 08/07/2014  . Altered mental status 08/07/2014  . 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 Epigastric abdominal pain   . Abdominal pain, epigastric 11/17/2013  . Hypotension 11/17/2013  . Abdominal pain 11/16/2013  . Seizures (HCC) 06/06/2012  . ESRD (end stage renal disease) on dialysis (HCC) 06/06/2012  . Anemia of chronic disease 06/06/2012  . Secondary hyperparathyroidism (of renal origin) 06/06/2012  . HTN (hypertension) 06/06/2012  . Arteriovenous fistula occlusion (HCC) 06/06/2012    Palliative Care Assessment & Plan   Patient Profile: 47 y.o. male  with past medical history of ESRD on HD since 2011, CVA April 2019 and residual L  sided hemiplegia and dysphagia, valvular heart disease, bed/wheelchair bound, seizures, OSA, anxiety, chronic back pain, and endocarditis admitted on 12/09/2017 with cough and shortness of breath. Chest x-ray revealed R moderate pleural  effusion. Patient had thoracentesis 11/12 with 2L of fluid removed. Patient recently admitted 9/11-9/23 to [facility] for acute bacterial endocarditis and was on prolonged IV antibiotic therapy at home - this ended over a week ago. PMT consulted for GOC.  Recommendations/Plan:  Full Comfort  Family educated on changes in patient's respiratory status and heart sounds. They are aware patient will most likely pass away soon <24hrs.   Robinul scheduled every 4hrs for excesseive secretions  Dilaudid PRN for air hunger/pain  PMT will continue to support and follow.   Goals of Care and Additional Recommendations:  Limitations on Scope of Treatment: Full Comfort Care  Code Status:    Code Status Orders  (From admission, onward)         Start     Ordered   12/01/17 1033  Do not attempt resuscitation (DNR)  Continuous    Question Answer Comment  In the event of cardiac or respiratory ARREST Do not call a "code blue"   In the event of cardiac or respiratory ARREST Do not perform Intubation, CPR, defibrillation or ACLS   In the event of cardiac or respiratory ARREST Use medication by any route, position, wound care, and other measures to relive pain and suffering. May use oxygen, suction and manual treatment of airway obstruction as needed for comfort.   Comments Nurse may pronounce      12/01/17 1033        Code Status History    Date Active Date Inactive Code Status Order ID Comments User Context   12/01/2017 1032 12/01/2017 1033 DNR [id number]  Joylene Draft, NP Inpatient   11/28/2017 0019 12/01/2017 1032 DNR [id number]  Altamese Dilling, MD Inpatient   09/27/2017 2010 10/10/2017 0137 DNR [id number]  Duayne Cal, NP Inpatient   09/21/2017  2126 09/27/2017 1814 DNR [id number]  Enedina Finner, MD Inpatient   08/07/2017 1353 08/11/2017 2024 DNR [id number]  Bertrum Sol, MD Inpatient   08/02/2017 2240 08/07/2017 1353 Full Code [id number]  Cammy Copa, MD Inpatient   07/27/2017 1249 08/02/2017 1940 Full Code [id number]  Enedina Finner, MD Inpatient   07/11/2016 2158 07/12/2016 1509 Full Code [id number]  Enedina Finner, MD Inpatient   05/06/2015 1126 05/08/2015 1456 Full Code [id number]  Erick Blinks, MD Inpatient   08/07/2014 1858 08/11/2014 1805 Full Code [id number]  Ardith Dark, MD Inpatient   05/05/2014 1632 05/07/2014 1405 Full Code [id number]  Luretha Murphy, MD Inpatient   11/16/2013 2301 11/17/2013 1724 Full Code [id number]  Levie Heritage, DO Inpatient       Prognosis:   Hours - Days  Discharge Planning:  Anticipated Hospital Death  Care plan was discussed with patient's family, CSW, and bedside RN.   Thank you for allowing the Palliative Medicine Team to assist in the care of this patient.   Time In: 1215  1253 Time Out: 1245  1325 Total Time 30 min.   32 min   Prolonged Time Billed  YES       Greater than 50%  of this time was spent counseling and coordinating care related to the above assessment and plan.  Willette Alma, AGPCNP-BC Palliative Medicine Team  Phone: [phone] Fax: [phone] Pager: [phone] Amion: Thea Alken   Please contact Palliative Medicine Team phone at [phone] for questions and concerns.

## 2017-12-17 NOTE — Care Management Important Message (Signed)
Important Message  Patient Details  Name: Perry [name] MRN: [id number] Date of Birth: 18-Nov-1970   Medicare Important Message Given:  Other (see comment) Late entry.  Patient was in isolation and the patients mother, Perry [name] signed the Important Message for her son  when Terrilee CroakBrenda Holland went into see them on 11/22/2017 @ 11:46 am.    Olegario MessierKathy A Amariyon Maynes 12/06/2017, 11:11 AM

## 2017-12-17 DEATH — deceased

## 2018-12-26 IMAGING — US US THORACENTESIS ASP PLEURAL SPACE W/IMG GUIDE
1 series · 3 of 3 positions shown · non-contrast
Comparison: CT of the chest on 11/27/2017

CLINICAL DATA: Large right pleural effusion.

EXAM:
ULTRASOUND GUIDED RIGHT THORACENTESIS

[Series 1: us thoracentesis asp pleural space w/img guide · 0.25mm/px · 3 of 3 slices shown]
[im 1/3]
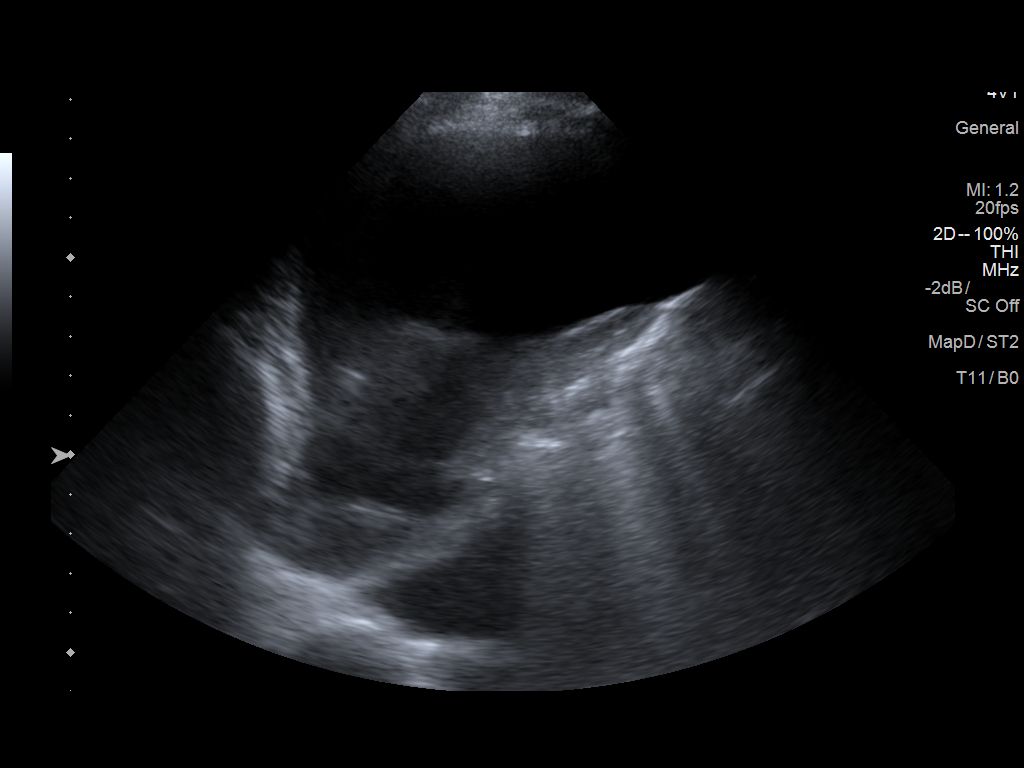
[im 2/3]
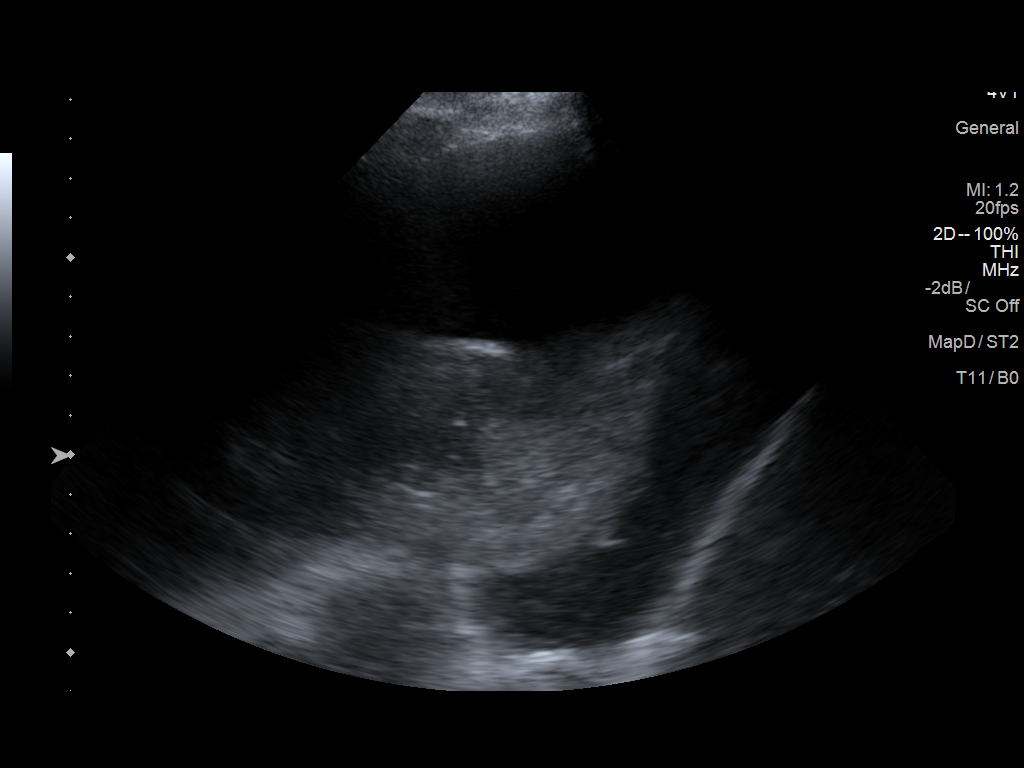
[im 3/3]
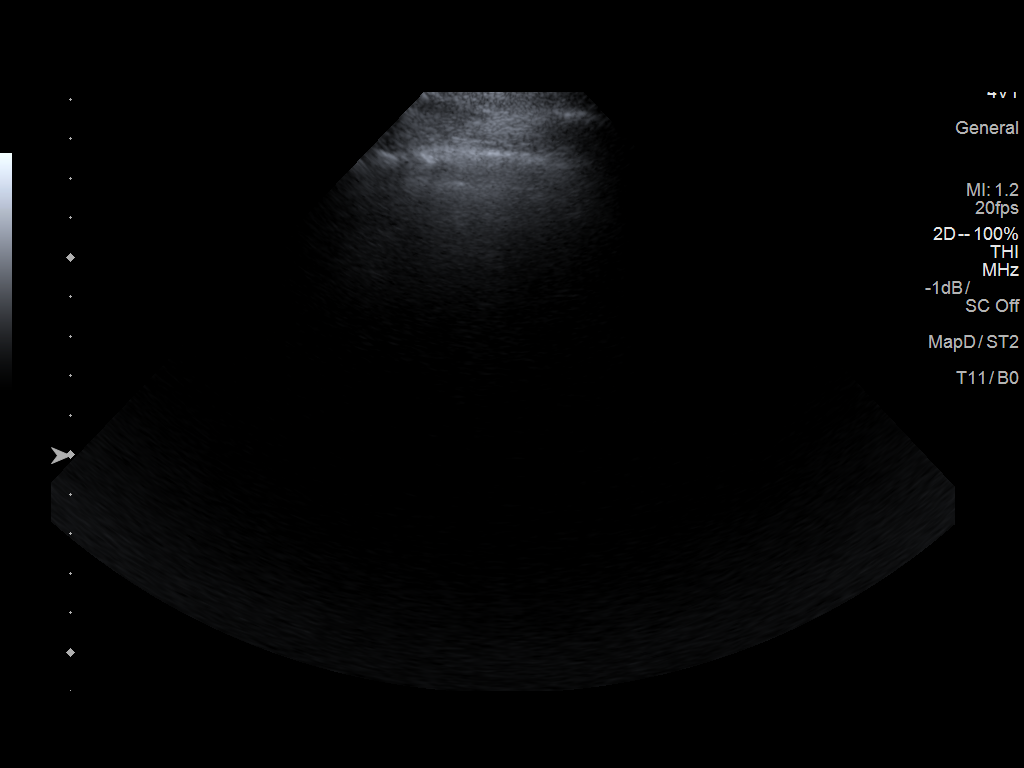

[3 of 3 positions shown; findings below may reference images not displayed]

PROCEDURE:
An ultrasound guided thoracentesis was thoroughly discussed with the
patient's mother and questions answered. The benefits, risks,
alternatives and complications were also discussed. The patient's
mother understands and wishes to proceed with the procedure. Written
consent was obtained.

Ultrasound was performed to localize and mark an adequate pocket of
fluid in the right chest. The area was then prepped and draped in
the normal sterile fashion. 1% Lidocaine was used for local
anesthesia. Under ultrasound guidance a 6 French Safe-T-Centesis
catheter was introduced. Thoracentesis was performed. The catheter
was removed and a dressing applied.

COMPLICATIONS:
None
FINDINGS: A total of approximately 2 L of clear, yellow fluid was removed. A
fluid sample was sent for laboratory analysis.
IMPRESSION: Successful ultrasound guided right thoracentesis yielding 2 L of
pleural fluid.

## 2019-09-18 IMAGING — DX DG CHEST 1V PORT
2 series · 2 of 2 positions shown · non-contrast
Comparison: Chest radiograph August 02, 2017

CLINICAL DATA: Shortness of breath.

EXAM:
PORTABLE CHEST 1 VIEW

[chest ap (1 of 2)]
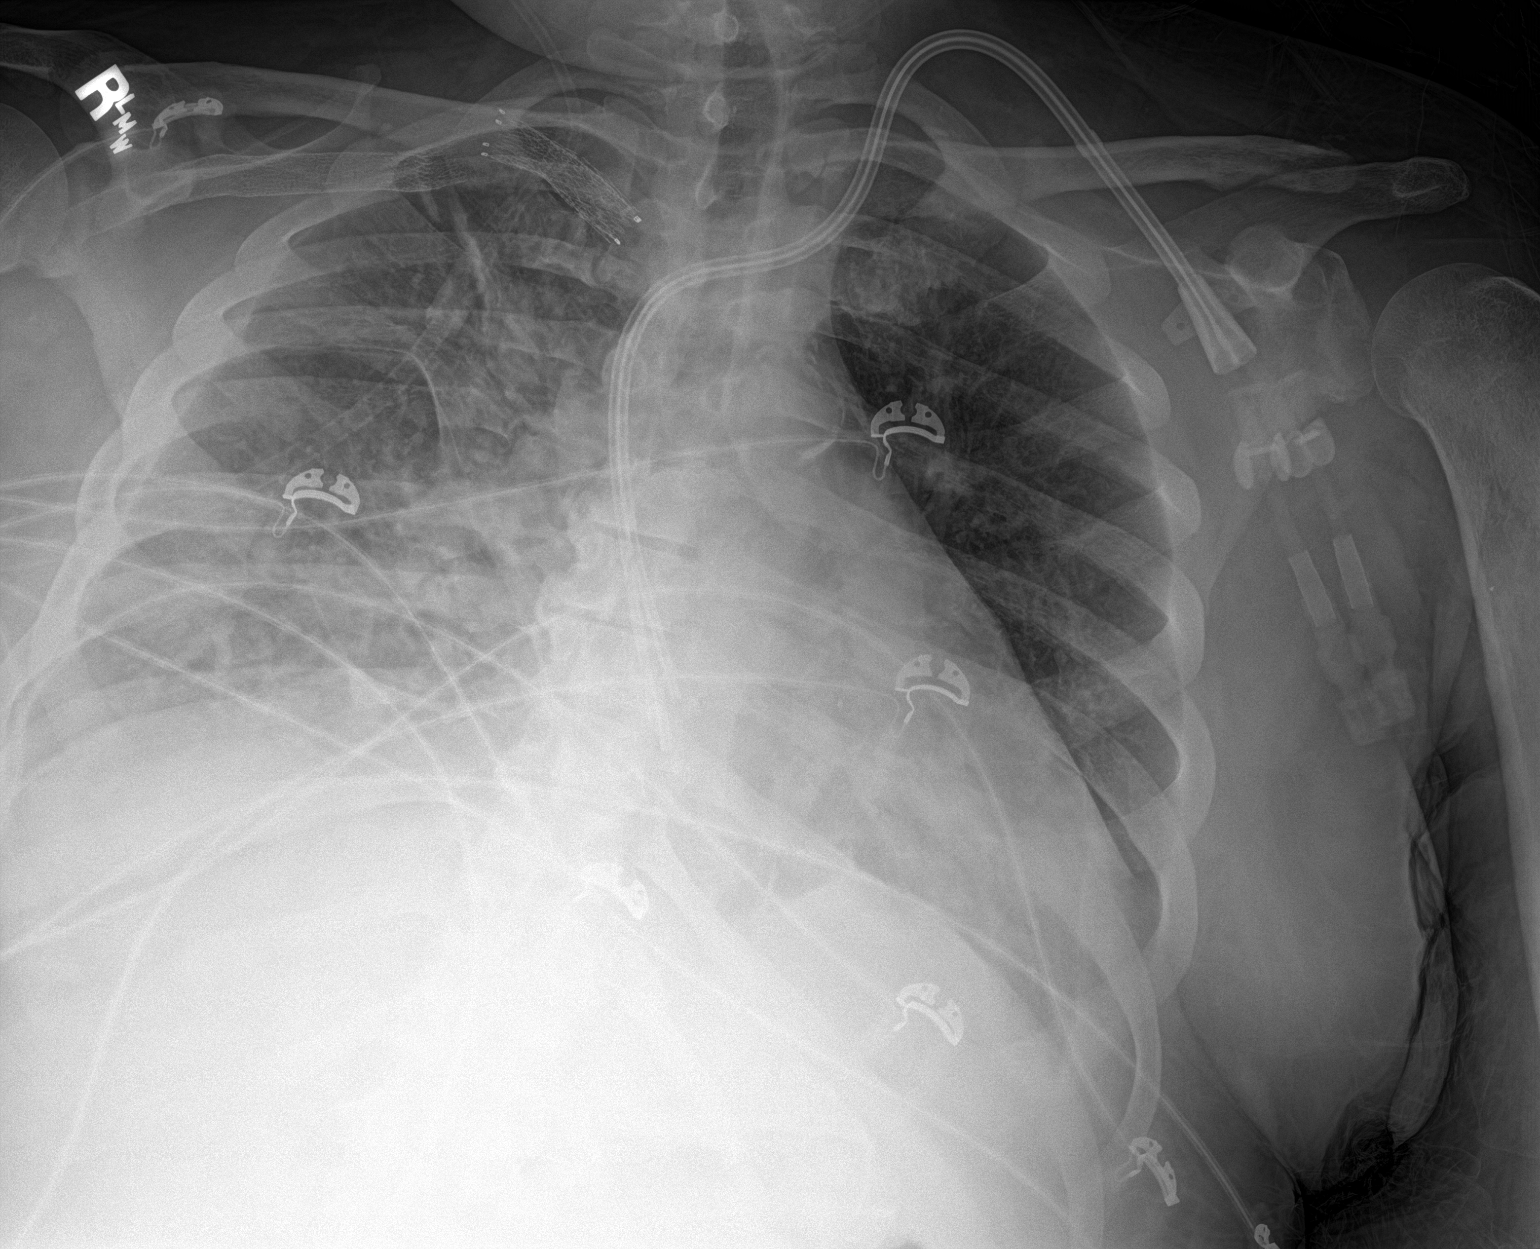

[chest ap (2 of 2)]
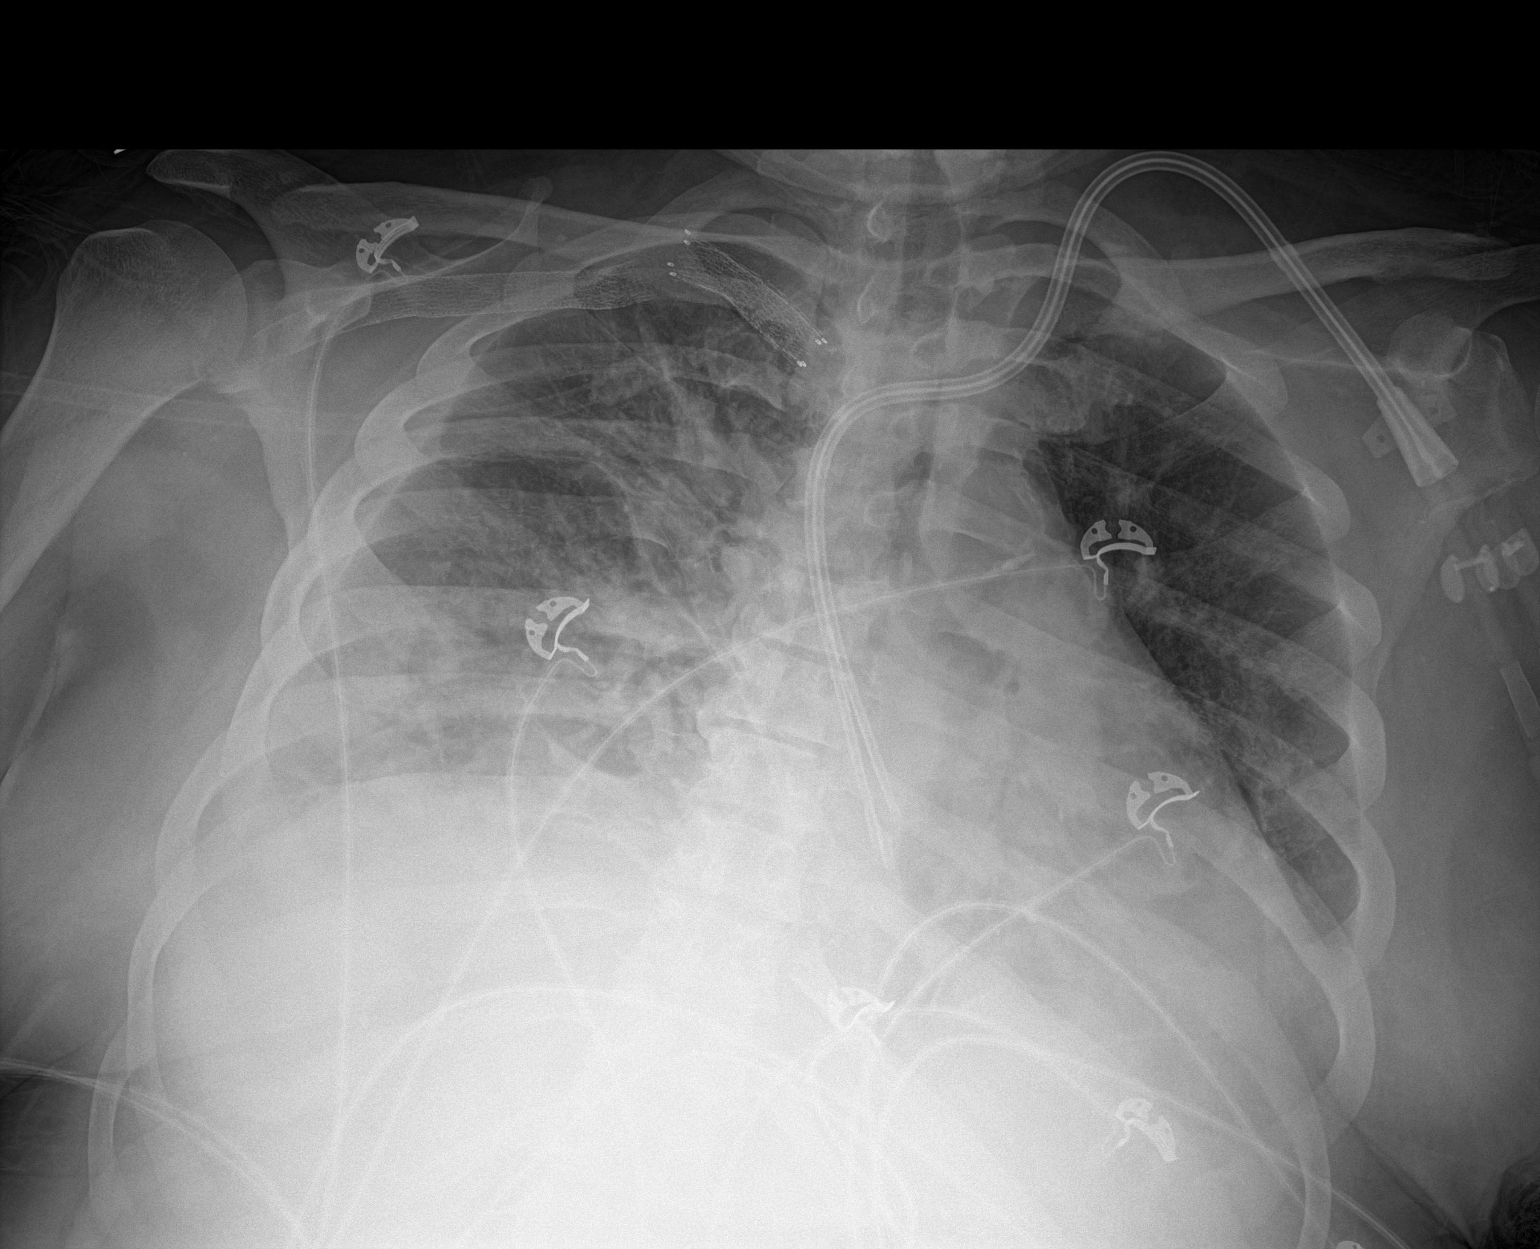

[2 of 2 positions shown; findings below may reference images not displayed]

FINDINGS: Cardiomediastinal silhouette is normal, calcified aortic arch.
Diffuse interstitial prominence with patchy bibasilar airspace
opacities with small pleural effusions. No pneumothorax. RIGHT
subclavian vascular stents. Tunneled dialysis catheter via LEFT
internal jugular venous approach with distal tip projecting in
cavoatrial junction. Soft tissue planes and included osseous
structures are non suspicious.
IMPRESSION: Interstitial and alveolar airspace opacities seen with pulmonary
edema, less likely pneumonia. Small pleural effusions.

LEFT dialysis catheter in situ.

Aortic Atherosclerosis ([id number]).

## 2019-10-22 IMAGING — DX DG CHEST 1V PORT
1 series · 1 of 1 positions shown · non-contrast
Comparison: August 21, 2017

CLINICAL DATA: Fever and chronic cough.

EXAM:
PORTABLE CHEST 1 VIEW

[chest ap]
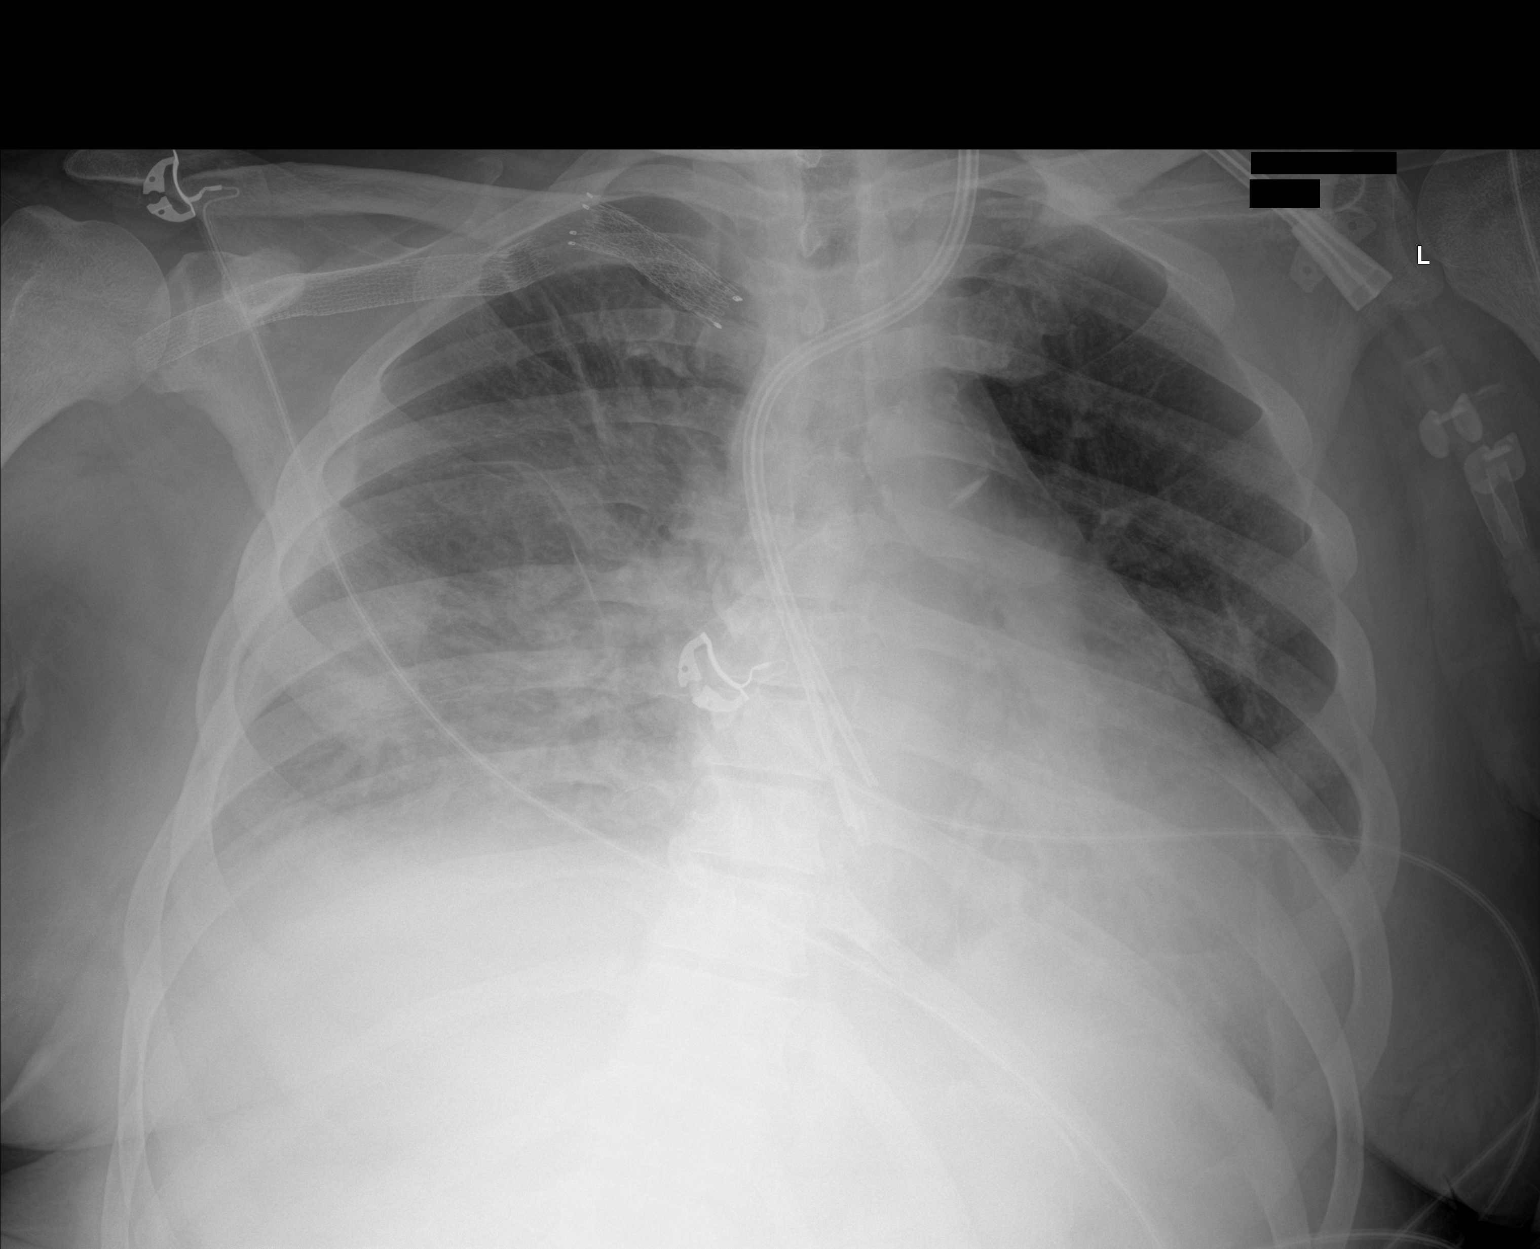

[1 of 1 positions shown; findings below may reference images not displayed]

FINDINGS: Dual lead left central venous line is unchanged. There is pulmonary
edema, right greater than left. There are bilateral pleural
effusions. No definite focal consolidation is identified. The bony
structures are stable.
IMPRESSION: Pulmonary edema.  Bilateral pleural effusions.

## 2019-10-31 IMAGING — XA IR FLUORO GUIDE CV LINE*L*
1 series · 1 of 1 positions shown · non-contrast
Comparison: none

INDICATION: End-stage renal disease. In need of durable intravenous access for
the continuation of dialysis.

[Series 3: single · 1 of 1 slices shown]
[im 1/1]
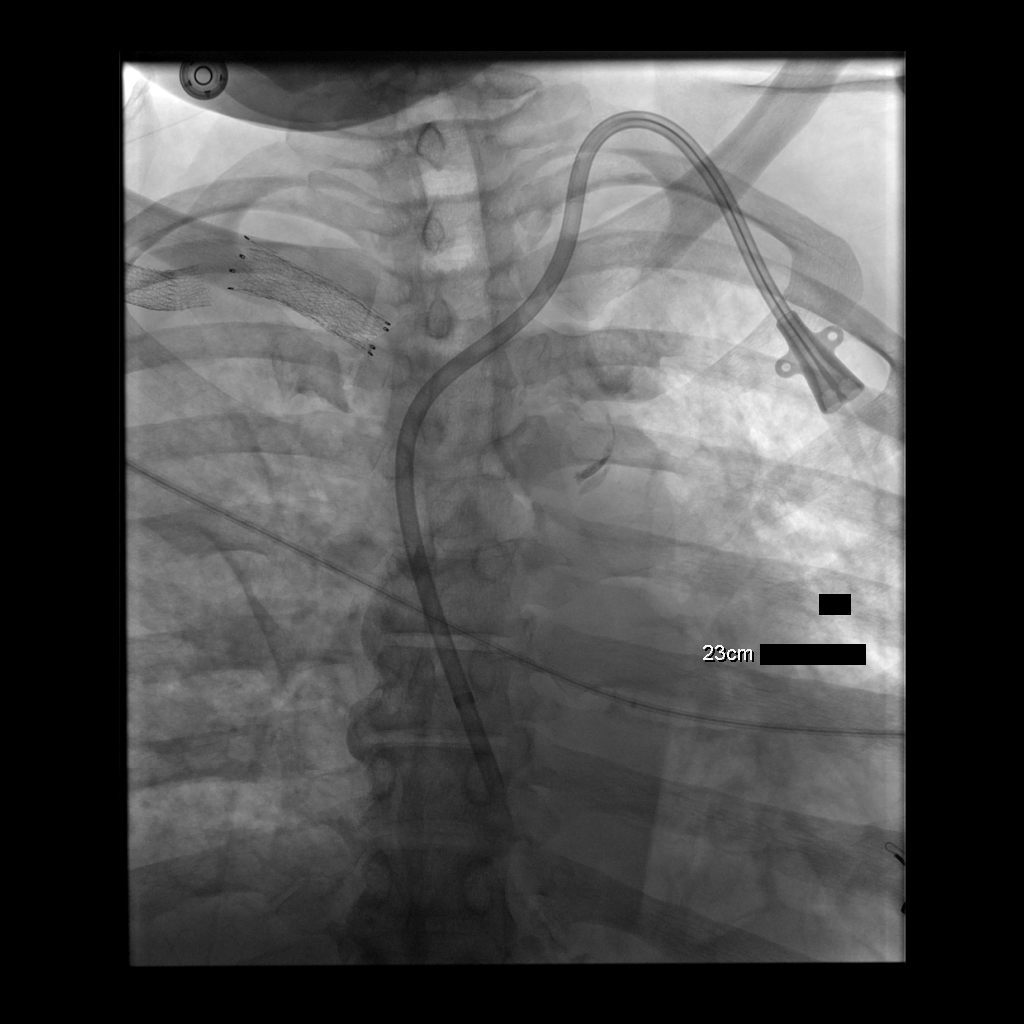

[1 of 1 positions shown; findings below may reference images not displayed]

EXAM:
TUNNELED CENTRAL VENOUS HEMODIALYSIS CATHETER PLACEMENT WITH
ULTRASOUND AND FLUOROSCOPIC GUIDANCE

MEDICATIONS:
Ancef 2 gm IV . The antibiotic was given in an appropriate time
interval prior to skin puncture.

ANESTHESIA/SEDATION:
Versed 1 mg IV; Fentanyl 50 mcg IV;

Moderate Sedation Time:  17 minutes

The patient was continuously monitored during the procedure by the
interventional radiology nurse under my direct supervision.

FLUOROSCOPY TIME:  1 minute, 18 seconds (30 mGy)

COMPLICATIONS:
None immediate.

PROCEDURE:
Informed written consent was obtained from the patient after a
discussion of the risks, benefits, and alternatives to treatment.
Questions regarding the procedure were encouraged and answered.
Sonographic evaluation was performed of the right neck and failed to
delineate a patent right internal jugular vein.

Sonographic evaluation was performed of the left neck and
demonstrated a partially occluded left internal jugular vein. As
such, decision was made to attempt placement of a left internal
jugular approach dialysis catheter.

The left neck and chest were prepped with chlorhexidine in a sterile
fashion, and a sterile drape was applied covering the operative
field. Maximum barrier sterile technique with sterile gowns and
gloves were used for the procedure. A timeout was performed prior to
the initiation of the procedure.

After creating a small venotomy incision, a micropuncture kit was
utilized to access the left internal jugular vein. Real-time
ultrasound guidance was utilized for vascular access including the
acquisition of a permanent ultrasound image documenting patency of
the accessed vessel. The microwire was utilized to measure
appropriate catheter length.

A stiff Glidewire was advanced to the level of the IVC and the
micropuncture sheath was exchanged for a peel-away sheath. A
palindrome tunneled hemodialysis catheter measuring 23 cm from tip
to cuff was tunneled in a retrograde fashion from the anterior chest
wall to the venotomy incision.

The catheter was then placed through the peel-away sheath with tips
ultimately positioned within the superior aspect of the right
atrium. Final catheter positioning was confirmed and documented with
a spot radiographic image. The catheter aspirates and flushes
normally. The catheter was flushed with appropriate volume heparin
dwells.

The catheter exit site was secured with a 0-Prolene retention
suture. The venotomy incision was closed with Dermabond and
Andreas. Dressings were applied. The patient tolerated the
procedure well without immediate post procedural complication.
IMPRESSION: Successful placement of 23 cm tip to cuff tunneled hemodialysis
catheter via the left internal jugular vein with tips terminating
within the superior aspect of the right atrium. The catheter is
ready for immediate use.

PLAN:
Note, given lack of a patent right internal jugular vein as well as
partial occlusion of the left internal jugular vein, future dialysis
access sites may require femoral approach dialysis catheter
placement.
